# Patient Record
Sex: Female | Born: 1970
Health system: Southern US, Community
[De-identification: ages and names within clinical notes are randomized; demographics above are authoritative.]

## PROBLEM LIST (undated history)

## (undated) DIAGNOSIS — F32A Depression, unspecified: Secondary | ICD-10-CM

## (undated) DIAGNOSIS — Z923 Personal history of irradiation: Secondary | ICD-10-CM

## (undated) DIAGNOSIS — E119 Type 2 diabetes mellitus without complications: Secondary | ICD-10-CM

## (undated) DIAGNOSIS — F319 Bipolar disorder, unspecified: Secondary | ICD-10-CM

## (undated) DIAGNOSIS — M1990 Unspecified osteoarthritis, unspecified site: Secondary | ICD-10-CM

## (undated) DIAGNOSIS — F419 Anxiety disorder, unspecified: Secondary | ICD-10-CM

## (undated) DIAGNOSIS — Z9221 Personal history of antineoplastic chemotherapy: Secondary | ICD-10-CM

## (undated) DIAGNOSIS — D649 Anemia, unspecified: Secondary | ICD-10-CM

## (undated) DIAGNOSIS — J449 Chronic obstructive pulmonary disease, unspecified: Secondary | ICD-10-CM

## (undated) DIAGNOSIS — E039 Hypothyroidism, unspecified: Secondary | ICD-10-CM

## (undated) DIAGNOSIS — Z5189 Encounter for other specified aftercare: Secondary | ICD-10-CM

## (undated) DIAGNOSIS — R011 Cardiac murmur, unspecified: Secondary | ICD-10-CM

## (undated) DIAGNOSIS — C801 Malignant (primary) neoplasm, unspecified: Secondary | ICD-10-CM

## (undated) HISTORY — DX: Malignant (primary) neoplasm, unspecified: C80.1

## (undated) HISTORY — DX: Unspecified osteoarthritis, unspecified site: M19.90

## (undated) HISTORY — DX: Type 2 diabetes mellitus without complications: E11.9

## (undated) HISTORY — PX: TONSILLECTOMY AND ADENOIDECTOMY: SUR1326

## (undated) HISTORY — DX: Hypothyroidism, unspecified: E03.9

## (undated) HISTORY — DX: Anemia, unspecified: D64.9

## (undated) HISTORY — PX: MULTIPLE TOOTH EXTRACTIONS: SHX2053

## (undated) HISTORY — DX: Encounter for other specified aftercare: Z51.89

## (undated) HISTORY — DX: Personal history of irradiation: Z92.3

## (undated) HISTORY — PX: DILATION AND CURETTAGE OF UTERUS: SHX78

## (undated) HISTORY — DX: Cardiac murmur, unspecified: R01.1

---

## 2000-07-13 ENCOUNTER — Emergency Department (HOSPITAL_COMMUNITY): Admission: EM | Admit: 2000-07-13 | Discharge: 2000-07-13 | Payer: Self-pay | Admitting: Emergency Medicine

## 2001-02-16 ENCOUNTER — Emergency Department (HOSPITAL_COMMUNITY): Admission: EM | Admit: 2001-02-16 | Discharge: 2001-02-16 | Payer: Self-pay | Admitting: Emergency Medicine

## 2002-09-07 ENCOUNTER — Emergency Department (HOSPITAL_COMMUNITY): Admission: EM | Admit: 2002-09-07 | Discharge: 2002-09-07 | Payer: Self-pay | Admitting: Emergency Medicine

## 2003-03-18 ENCOUNTER — Encounter: Payer: Self-pay | Admitting: Family Medicine

## 2003-03-18 ENCOUNTER — Inpatient Hospital Stay (HOSPITAL_COMMUNITY): Admission: AD | Admit: 2003-03-18 | Discharge: 2003-03-18 | Payer: Self-pay | Admitting: Family Medicine

## 2003-04-06 ENCOUNTER — Encounter: Admission: RE | Admit: 2003-04-06 | Discharge: 2003-04-06 | Payer: Self-pay | Admitting: Family Medicine

## 2003-04-30 ENCOUNTER — Other Ambulatory Visit: Admission: RE | Admit: 2003-04-30 | Discharge: 2003-04-30 | Payer: Self-pay | Admitting: Family Medicine

## 2003-04-30 ENCOUNTER — Encounter: Admission: RE | Admit: 2003-04-30 | Discharge: 2003-04-30 | Payer: Self-pay | Admitting: Family Medicine

## 2003-05-07 ENCOUNTER — Ambulatory Visit (HOSPITAL_COMMUNITY): Admission: RE | Admit: 2003-05-07 | Discharge: 2003-05-07 | Payer: Self-pay | Admitting: Family Medicine

## 2003-05-27 ENCOUNTER — Encounter: Admission: RE | Admit: 2003-05-27 | Discharge: 2003-05-27 | Payer: Self-pay | Admitting: Family Medicine

## 2003-06-17 ENCOUNTER — Ambulatory Visit (HOSPITAL_COMMUNITY): Admission: RE | Admit: 2003-06-17 | Discharge: 2003-06-17 | Payer: Self-pay | Admitting: Obstetrics and Gynecology

## 2003-06-26 ENCOUNTER — Inpatient Hospital Stay (HOSPITAL_COMMUNITY): Admission: AD | Admit: 2003-06-26 | Discharge: 2003-06-26 | Payer: Self-pay | Admitting: *Deleted

## 2003-06-29 ENCOUNTER — Encounter: Admission: RE | Admit: 2003-06-29 | Discharge: 2003-06-29 | Payer: Self-pay | Admitting: Sports Medicine

## 2003-07-28 ENCOUNTER — Encounter: Admission: RE | Admit: 2003-07-28 | Discharge: 2003-07-28 | Payer: Self-pay | Admitting: Family Medicine

## 2003-08-05 ENCOUNTER — Encounter: Admission: RE | Admit: 2003-08-05 | Discharge: 2003-08-05 | Payer: Self-pay | Admitting: Sports Medicine

## 2003-08-11 ENCOUNTER — Encounter: Admission: RE | Admit: 2003-08-11 | Discharge: 2003-08-11 | Payer: Self-pay | Admitting: Family Medicine

## 2003-08-13 ENCOUNTER — Inpatient Hospital Stay (HOSPITAL_COMMUNITY): Admission: AD | Admit: 2003-08-13 | Discharge: 2003-08-14 | Payer: Self-pay | Admitting: Family Medicine

## 2003-08-23 ENCOUNTER — Encounter: Admission: RE | Admit: 2003-08-23 | Discharge: 2003-08-23 | Payer: Self-pay | Admitting: Family Medicine

## 2003-09-06 ENCOUNTER — Encounter: Admission: RE | Admit: 2003-09-06 | Discharge: 2003-09-06 | Payer: Self-pay | Admitting: Family Medicine

## 2003-09-08 ENCOUNTER — Inpatient Hospital Stay (HOSPITAL_COMMUNITY): Admission: AD | Admit: 2003-09-08 | Discharge: 2003-09-08 | Payer: Self-pay | Admitting: *Deleted

## 2003-09-27 ENCOUNTER — Encounter: Admission: RE | Admit: 2003-09-27 | Discharge: 2003-09-27 | Payer: Self-pay | Admitting: Family Medicine

## 2003-10-12 ENCOUNTER — Encounter: Admission: RE | Admit: 2003-10-12 | Discharge: 2003-10-12 | Payer: Self-pay | Admitting: Sports Medicine

## 2003-10-26 ENCOUNTER — Encounter: Admission: RE | Admit: 2003-10-26 | Discharge: 2003-10-26 | Payer: Self-pay | Admitting: Sports Medicine

## 2003-10-27 ENCOUNTER — Inpatient Hospital Stay (HOSPITAL_COMMUNITY): Admission: AD | Admit: 2003-10-27 | Discharge: 2003-10-27 | Payer: Self-pay | Admitting: Obstetrics and Gynecology

## 2003-10-28 ENCOUNTER — Inpatient Hospital Stay (HOSPITAL_COMMUNITY): Admission: AD | Admit: 2003-10-28 | Discharge: 2003-10-28 | Payer: Self-pay | Admitting: Family Medicine

## 2003-11-04 ENCOUNTER — Encounter: Admission: RE | Admit: 2003-11-04 | Discharge: 2003-11-04 | Payer: Self-pay | Admitting: Sports Medicine

## 2003-11-10 ENCOUNTER — Encounter: Admission: RE | Admit: 2003-11-10 | Discharge: 2003-11-10 | Payer: Self-pay | Admitting: Family Medicine

## 2003-11-16 ENCOUNTER — Encounter: Admission: RE | Admit: 2003-11-16 | Discharge: 2003-11-16 | Payer: Self-pay | Admitting: Family Medicine

## 2003-11-18 ENCOUNTER — Inpatient Hospital Stay (HOSPITAL_COMMUNITY): Admission: AD | Admit: 2003-11-18 | Discharge: 2003-11-21 | Payer: Self-pay | Admitting: Obstetrics & Gynecology

## 2004-04-10 ENCOUNTER — Encounter: Admission: RE | Admit: 2004-04-10 | Discharge: 2004-04-10 | Payer: Self-pay | Admitting: Family Medicine

## 2004-05-19 ENCOUNTER — Encounter: Admission: RE | Admit: 2004-05-19 | Discharge: 2004-05-19 | Payer: Self-pay | Admitting: Family Medicine

## 2004-06-15 ENCOUNTER — Inpatient Hospital Stay (HOSPITAL_COMMUNITY): Admission: AD | Admit: 2004-06-15 | Discharge: 2004-06-15 | Payer: Self-pay | Admitting: *Deleted

## 2004-06-19 ENCOUNTER — Encounter: Admission: RE | Admit: 2004-06-19 | Discharge: 2004-06-19 | Payer: Self-pay | Admitting: Family Medicine

## 2004-07-06 ENCOUNTER — Ambulatory Visit: Payer: Self-pay | Admitting: Family Medicine

## 2004-07-06 ENCOUNTER — Ambulatory Visit (HOSPITAL_COMMUNITY): Admission: RE | Admit: 2004-07-06 | Discharge: 2004-07-06 | Payer: Self-pay | Admitting: Family Medicine

## 2004-08-04 ENCOUNTER — Ambulatory Visit: Payer: Self-pay | Admitting: Family Medicine

## 2004-09-07 ENCOUNTER — Ambulatory Visit: Payer: Self-pay | Admitting: Sports Medicine

## 2004-09-28 ENCOUNTER — Ambulatory Visit (HOSPITAL_COMMUNITY): Admission: RE | Admit: 2004-09-28 | Discharge: 2004-09-28 | Payer: Self-pay | Admitting: Family Medicine

## 2004-10-02 ENCOUNTER — Ambulatory Visit: Payer: Self-pay | Admitting: Family Medicine

## 2004-10-09 ENCOUNTER — Ambulatory Visit: Payer: Self-pay | Admitting: Family Medicine

## 2004-11-16 ENCOUNTER — Ambulatory Visit: Payer: Self-pay | Admitting: Sports Medicine

## 2004-11-16 ENCOUNTER — Other Ambulatory Visit: Admission: RE | Admit: 2004-11-16 | Discharge: 2004-11-16 | Payer: Self-pay | Admitting: Family Medicine

## 2004-11-16 ENCOUNTER — Ambulatory Visit: Payer: Self-pay | Admitting: Family Medicine

## 2004-11-16 ENCOUNTER — Inpatient Hospital Stay (HOSPITAL_COMMUNITY): Admission: RE | Admit: 2004-11-16 | Discharge: 2004-11-21 | Payer: Self-pay | Admitting: Family Medicine

## 2004-11-23 ENCOUNTER — Ambulatory Visit: Payer: Self-pay | Admitting: Family Medicine

## 2004-11-29 ENCOUNTER — Ambulatory Visit: Payer: Self-pay | Admitting: *Deleted

## 2004-11-29 ENCOUNTER — Inpatient Hospital Stay (HOSPITAL_COMMUNITY): Admission: AD | Admit: 2004-11-29 | Discharge: 2004-11-29 | Payer: Self-pay | Admitting: Obstetrics and Gynecology

## 2004-12-13 ENCOUNTER — Ambulatory Visit: Payer: Self-pay | Admitting: *Deleted

## 2004-12-20 ENCOUNTER — Ambulatory Visit: Payer: Self-pay | Admitting: *Deleted

## 2004-12-27 ENCOUNTER — Ambulatory Visit: Payer: Self-pay | Admitting: *Deleted

## 2005-01-03 ENCOUNTER — Ambulatory Visit: Payer: Self-pay | Admitting: *Deleted

## 2005-01-03 ENCOUNTER — Ambulatory Visit (HOSPITAL_COMMUNITY): Admission: RE | Admit: 2005-01-03 | Discharge: 2005-01-03 | Payer: Self-pay | Admitting: *Deleted

## 2005-01-10 ENCOUNTER — Ambulatory Visit: Payer: Self-pay | Admitting: *Deleted

## 2005-01-13 ENCOUNTER — Observation Stay (HOSPITAL_COMMUNITY): Admission: AD | Admit: 2005-01-13 | Discharge: 2005-01-13 | Payer: Self-pay | Admitting: *Deleted

## 2005-01-13 ENCOUNTER — Ambulatory Visit: Payer: Self-pay | Admitting: Obstetrics and Gynecology

## 2005-01-17 ENCOUNTER — Ambulatory Visit: Payer: Self-pay | Admitting: *Deleted

## 2005-01-24 ENCOUNTER — Ambulatory Visit: Payer: Self-pay | Admitting: Obstetrics & Gynecology

## 2005-01-24 ENCOUNTER — Inpatient Hospital Stay (HOSPITAL_COMMUNITY): Admission: AD | Admit: 2005-01-24 | Discharge: 2005-01-26 | Payer: Self-pay | Admitting: Obstetrics & Gynecology

## 2005-01-24 ENCOUNTER — Encounter (INDEPENDENT_AMBULATORY_CARE_PROVIDER_SITE_OTHER): Payer: Self-pay | Admitting: *Deleted

## 2005-02-08 ENCOUNTER — Ambulatory Visit: Payer: Self-pay | Admitting: Sports Medicine

## 2005-03-02 ENCOUNTER — Ambulatory Visit: Payer: Self-pay | Admitting: Family Medicine

## 2005-08-22 ENCOUNTER — Ambulatory Visit: Payer: Self-pay | Admitting: Family Medicine

## 2005-09-03 ENCOUNTER — Other Ambulatory Visit: Admission: RE | Admit: 2005-09-03 | Discharge: 2005-09-03 | Payer: Self-pay | Admitting: Family Medicine

## 2005-09-03 ENCOUNTER — Ambulatory Visit: Payer: Self-pay | Admitting: Family Medicine

## 2005-10-02 ENCOUNTER — Ambulatory Visit: Payer: Self-pay | Admitting: Family Medicine

## 2005-10-03 ENCOUNTER — Ambulatory Visit: Payer: Self-pay | Admitting: Family Medicine

## 2005-10-22 DIAGNOSIS — Z5189 Encounter for other specified aftercare: Secondary | ICD-10-CM

## 2005-10-22 HISTORY — DX: Encounter for other specified aftercare: Z51.89

## 2005-10-31 ENCOUNTER — Ambulatory Visit: Payer: Self-pay | Admitting: Family Medicine

## 2005-11-13 ENCOUNTER — Ambulatory Visit (HOSPITAL_COMMUNITY): Admission: RE | Admit: 2005-11-13 | Discharge: 2005-11-13 | Payer: Self-pay | Admitting: *Deleted

## 2005-12-04 ENCOUNTER — Ambulatory Visit: Payer: Self-pay | Admitting: Sports Medicine

## 2005-12-06 ENCOUNTER — Ambulatory Visit (HOSPITAL_COMMUNITY): Admission: RE | Admit: 2005-12-06 | Discharge: 2005-12-06 | Payer: Self-pay | Admitting: Family Medicine

## 2006-01-02 ENCOUNTER — Ambulatory Visit: Payer: Self-pay | Admitting: Family Medicine

## 2006-01-07 ENCOUNTER — Ambulatory Visit: Payer: Self-pay | Admitting: Family Medicine

## 2006-01-10 ENCOUNTER — Ambulatory Visit: Payer: Self-pay | Admitting: Family Medicine

## 2006-01-28 ENCOUNTER — Ambulatory Visit: Payer: Self-pay | Admitting: Family Medicine

## 2006-03-04 ENCOUNTER — Ambulatory Visit: Payer: Self-pay | Admitting: Family Medicine

## 2006-03-19 ENCOUNTER — Ambulatory Visit: Payer: Self-pay | Admitting: Family Medicine

## 2006-03-25 ENCOUNTER — Inpatient Hospital Stay (HOSPITAL_COMMUNITY): Admission: AD | Admit: 2006-03-25 | Discharge: 2006-03-27 | Payer: Self-pay | Admitting: Family Medicine

## 2006-03-25 ENCOUNTER — Ambulatory Visit: Payer: Self-pay | Admitting: Gynecology

## 2006-03-29 ENCOUNTER — Ambulatory Visit: Payer: Self-pay | Admitting: Family Medicine

## 2006-04-09 ENCOUNTER — Ambulatory Visit: Payer: Self-pay | Admitting: Sports Medicine

## 2006-04-11 ENCOUNTER — Ambulatory Visit: Payer: Self-pay | Admitting: Obstetrics & Gynecology

## 2006-04-11 ENCOUNTER — Inpatient Hospital Stay (HOSPITAL_COMMUNITY): Admission: AD | Admit: 2006-04-11 | Discharge: 2006-04-12 | Payer: Self-pay | Admitting: Obstetrics and Gynecology

## 2006-04-11 ENCOUNTER — Encounter (INDEPENDENT_AMBULATORY_CARE_PROVIDER_SITE_OTHER): Payer: Self-pay | Admitting: Specialist

## 2006-04-16 ENCOUNTER — Ambulatory Visit (HOSPITAL_COMMUNITY): Admission: RE | Admit: 2006-04-16 | Discharge: 2006-04-16 | Payer: Self-pay | Admitting: Internal Medicine

## 2006-04-17 ENCOUNTER — Ambulatory Visit: Payer: Self-pay | Admitting: Family Medicine

## 2006-04-26 ENCOUNTER — Ambulatory Visit: Payer: Self-pay | Admitting: Gynecology

## 2006-05-09 ENCOUNTER — Ambulatory Visit: Payer: Self-pay | Admitting: Family Medicine

## 2006-07-11 ENCOUNTER — Ambulatory Visit: Payer: Self-pay | Admitting: Family Medicine

## 2006-07-29 ENCOUNTER — Ambulatory Visit: Payer: Self-pay | Admitting: Family Medicine

## 2006-08-09 ENCOUNTER — Encounter: Admission: RE | Admit: 2006-08-09 | Discharge: 2006-08-09 | Payer: Self-pay | Admitting: Sports Medicine

## 2006-08-16 ENCOUNTER — Ambulatory Visit: Payer: Self-pay | Admitting: Family Medicine

## 2006-08-22 ENCOUNTER — Encounter (INDEPENDENT_AMBULATORY_CARE_PROVIDER_SITE_OTHER): Payer: Self-pay | Admitting: *Deleted

## 2006-08-22 LAB — CONVERTED CEMR LAB: Pap Smear: NORMAL

## 2006-08-27 ENCOUNTER — Ambulatory Visit: Payer: Self-pay | Admitting: Family Medicine

## 2006-10-03 ENCOUNTER — Ambulatory Visit: Payer: Self-pay | Admitting: Sports Medicine

## 2006-10-28 ENCOUNTER — Ambulatory Visit: Payer: Self-pay | Admitting: Family Medicine

## 2006-11-19 ENCOUNTER — Encounter (INDEPENDENT_AMBULATORY_CARE_PROVIDER_SITE_OTHER): Payer: Self-pay | Admitting: Family Medicine

## 2006-11-19 ENCOUNTER — Ambulatory Visit: Payer: Self-pay | Admitting: Family Medicine

## 2006-11-19 LAB — CONVERTED CEMR LAB
ALT: <8 units/L (ref 0–35)
AST: 11 units/L (ref 0–37)
Albumin: 4.3 g/dL (ref 3.5–5.2)
Alkaline Phosphatase: 68 units/L (ref 39–117)
BUN: 8 mg/dL (ref 6–23)
CO2: 22 meq/L (ref 19–32)
Calcium: 8.8 mg/dL (ref 8.4–10.5)
Chloride: 104 meq/L (ref 96–112)
Cholesterol: 205 mg/dL — ABNORMAL HIGH (ref 0–200)
Creatinine, Ser: 0.93 mg/dL (ref 0.40–1.20)
Glucose, Bld: 65 mg/dL — ABNORMAL LOW (ref 70–99)
HDL: 38 mg/dL — ABNORMAL LOW (ref >39)
LDL Cholesterol: 143 mg/dL — ABNORMAL HIGH (ref 0–99)
Potassium: 4.1 meq/L (ref 3.5–5.3)
Sodium: 137 meq/L (ref 135–145)
Total Bilirubin: 0.3 mg/dL (ref 0.3–1.2)
Total CHOL/HDL Ratio: 5.4
Total Protein: 8 g/dL (ref 6.0–8.3)
Triglycerides: 118 mg/dL (ref <150)
VLDL: 24 mg/dL (ref 0–40)

## 2006-12-20 ENCOUNTER — Encounter (INDEPENDENT_AMBULATORY_CARE_PROVIDER_SITE_OTHER): Payer: Self-pay | Admitting: *Deleted

## 2007-01-15 ENCOUNTER — Ambulatory Visit: Payer: Self-pay | Admitting: Obstetrics and Gynecology

## 2007-01-20 ENCOUNTER — Telehealth: Payer: Self-pay | Admitting: *Deleted

## 2007-02-10 ENCOUNTER — Telehealth (INDEPENDENT_AMBULATORY_CARE_PROVIDER_SITE_OTHER): Payer: Self-pay | Admitting: *Deleted

## 2007-02-12 ENCOUNTER — Telehealth: Payer: Self-pay | Admitting: *Deleted

## 2007-02-20 ENCOUNTER — Ambulatory Visit: Payer: Self-pay | Admitting: Sports Medicine

## 2007-02-20 DIAGNOSIS — F17210 Nicotine dependence, cigarettes, uncomplicated: Secondary | ICD-10-CM

## 2007-02-20 DIAGNOSIS — F17200 Nicotine dependence, unspecified, uncomplicated: Secondary | ICD-10-CM | POA: Insufficient documentation

## 2007-02-20 DIAGNOSIS — Z720 Tobacco use: Secondary | ICD-10-CM | POA: Insufficient documentation

## 2007-02-20 DIAGNOSIS — F3160 Bipolar disorder, current episode mixed, unspecified: Secondary | ICD-10-CM | POA: Insufficient documentation

## 2007-02-20 LAB — CONVERTED CEMR LAB: Beta hcg, urine, semiquantitative: NEGATIVE

## 2007-02-25 ENCOUNTER — Ambulatory Visit: Payer: Self-pay | Admitting: Family Medicine

## 2007-02-25 ENCOUNTER — Encounter (INDEPENDENT_AMBULATORY_CARE_PROVIDER_SITE_OTHER): Payer: Self-pay | Admitting: Family Medicine

## 2007-02-25 LAB — CONVERTED CEMR LAB
ALT: 9 units/L (ref 0–35)
AST: 15 units/L (ref 0–37)
Albumin: 4.3 g/dL (ref 3.5–5.2)
Alkaline Phosphatase: 65 units/L (ref 39–117)
BUN: 9 mg/dL (ref 6–23)
CO2: 22 meq/L (ref 19–32)
Calcium: 8.8 mg/dL (ref 8.4–10.5)
Chloride: 104 meq/L (ref 96–112)
Cholesterol: 193 mg/dL (ref 0–200)
Creatinine, Ser: 0.9 mg/dL (ref 0.40–1.20)
Glucose, Bld: 69 mg/dL — ABNORMAL LOW (ref 70–99)
HCT: 29.8 %
HDL: 43 mg/dL (ref >39)
Hemoglobin: 9.2 g/dL
LDL Cholesterol: 126 mg/dL — ABNORMAL HIGH (ref 0–99)
MCV: 60.6 fL
Platelets: 349 10*3/uL
Potassium: 4.2 meq/L (ref 3.5–5.3)
RBC: 4.92 M/uL
Sodium: 139 meq/L (ref 135–145)
TSH: 2.069 microintl units/mL (ref 0.350–5.50)
Total Bilirubin: 0.2 mg/dL — ABNORMAL LOW (ref 0.3–1.2)
Total CHOL/HDL Ratio: 4.5
Total Protein: 7.9 g/dL (ref 6.0–8.3)
Triglycerides: 120 mg/dL (ref <150)
VLDL: 24 mg/dL (ref 0–40)
WBC: 7.4 10*3/uL

## 2007-04-02 ENCOUNTER — Ambulatory Visit (HOSPITAL_BASED_OUTPATIENT_CLINIC_OR_DEPARTMENT_OTHER): Admission: RE | Admit: 2007-04-02 | Discharge: 2007-04-02 | Payer: Self-pay | Admitting: Family Medicine

## 2007-04-05 ENCOUNTER — Ambulatory Visit: Payer: Self-pay | Admitting: Internal Medicine

## 2007-04-23 ENCOUNTER — Ambulatory Visit: Payer: Self-pay | Admitting: Family Medicine

## 2007-05-27 ENCOUNTER — Ambulatory Visit: Payer: Self-pay | Admitting: Family Medicine

## 2007-05-27 LAB — CONVERTED CEMR LAB: Beta hcg, urine, semiquantitative: NEGATIVE

## 2007-06-04 ENCOUNTER — Telehealth (INDEPENDENT_AMBULATORY_CARE_PROVIDER_SITE_OTHER): Payer: Self-pay | Admitting: Family Medicine

## 2007-06-04 ENCOUNTER — Ambulatory Visit: Payer: Self-pay | Admitting: Family Medicine

## 2007-06-18 ENCOUNTER — Encounter (INDEPENDENT_AMBULATORY_CARE_PROVIDER_SITE_OTHER): Payer: Self-pay | Admitting: Family Medicine

## 2007-06-18 ENCOUNTER — Ambulatory Visit: Payer: Self-pay | Admitting: Family Medicine

## 2007-06-27 ENCOUNTER — Ambulatory Visit: Payer: Self-pay | Admitting: Family Medicine

## 2007-06-30 ENCOUNTER — Telehealth (INDEPENDENT_AMBULATORY_CARE_PROVIDER_SITE_OTHER): Payer: Self-pay | Admitting: Family Medicine

## 2007-07-04 ENCOUNTER — Ambulatory Visit: Payer: Self-pay | Admitting: Family Medicine

## 2007-07-04 ENCOUNTER — Telehealth (INDEPENDENT_AMBULATORY_CARE_PROVIDER_SITE_OTHER): Payer: Self-pay | Admitting: Family Medicine

## 2007-07-04 ENCOUNTER — Encounter (INDEPENDENT_AMBULATORY_CARE_PROVIDER_SITE_OTHER): Payer: Self-pay | Admitting: Family Medicine

## 2007-07-05 ENCOUNTER — Telehealth (INDEPENDENT_AMBULATORY_CARE_PROVIDER_SITE_OTHER): Payer: Self-pay | Admitting: Family Medicine

## 2007-07-07 ENCOUNTER — Telehealth (INDEPENDENT_AMBULATORY_CARE_PROVIDER_SITE_OTHER): Payer: Self-pay | Admitting: Family Medicine

## 2007-07-14 ENCOUNTER — Encounter (INDEPENDENT_AMBULATORY_CARE_PROVIDER_SITE_OTHER): Payer: Self-pay | Admitting: Family Medicine

## 2007-07-18 ENCOUNTER — Ambulatory Visit: Payer: Self-pay | Admitting: Family Medicine

## 2007-07-18 ENCOUNTER — Encounter (INDEPENDENT_AMBULATORY_CARE_PROVIDER_SITE_OTHER): Payer: Self-pay | Admitting: Family Medicine

## 2007-08-01 ENCOUNTER — Encounter (INDEPENDENT_AMBULATORY_CARE_PROVIDER_SITE_OTHER): Payer: Self-pay | Admitting: Family Medicine

## 2007-08-01 ENCOUNTER — Encounter: Payer: Self-pay | Admitting: *Deleted

## 2007-08-01 ENCOUNTER — Ambulatory Visit: Payer: Self-pay | Admitting: Family Medicine

## 2007-08-01 LAB — CONVERTED CEMR LAB
Antibody Screen: NEGATIVE
Basophils Absolute: 0.1 10*3/uL (ref 0.0–0.1)
Basophils Relative: 1 % (ref 0–1)
Beta hcg, urine, semiquantitative: POSITIVE
Eosinophils Absolute: 0.1 10*3/uL (ref 0.0–0.7)
Eosinophils Relative: 1 % (ref 0–5)
HCT: 32 % — ABNORMAL LOW (ref 36.0–46.0)
Hemoglobin: 8.4 g/dL — ABNORMAL LOW (ref 12.0–15.0)
Hepatitis B Surface Ag: NEGATIVE
Lymphocytes Relative: 40 % (ref 12–46)
Lymphs Abs: 3.3 10*3/uL (ref 0.7–3.3)
MCHC: 26.3 g/dL — ABNORMAL LOW (ref 30.0–36.0)
MCV: 59.7 fL — ABNORMAL LOW (ref 78.0–100.0)
Monocytes Absolute: 1 10*3/uL — ABNORMAL HIGH (ref 0.2–0.7)
Monocytes Relative: 12 % — ABNORMAL HIGH (ref 3–11)
Neutro Abs: 3.7 10*3/uL (ref 1.7–7.7)
Neutrophils Relative %: 45 % (ref 43–77)
Platelets: 391 10*3/uL (ref 150–400)
RBC: 5.36 M/uL — ABNORMAL HIGH (ref 3.87–5.11)
RDW: 20.5 % — ABNORMAL HIGH (ref 11.5–14.0)
Rh Type: POSITIVE
Rubella: 30.5 intl units/mL — ABNORMAL HIGH
WBC: 8.2 10*3/uL (ref 4.0–10.5)

## 2007-08-02 ENCOUNTER — Encounter (INDEPENDENT_AMBULATORY_CARE_PROVIDER_SITE_OTHER): Payer: Self-pay | Admitting: Family Medicine

## 2007-08-06 ENCOUNTER — Encounter (INDEPENDENT_AMBULATORY_CARE_PROVIDER_SITE_OTHER): Payer: Self-pay | Admitting: Family Medicine

## 2007-08-06 ENCOUNTER — Ambulatory Visit (HOSPITAL_COMMUNITY): Admission: RE | Admit: 2007-08-06 | Discharge: 2007-08-06 | Payer: Self-pay | Admitting: Family Medicine

## 2007-08-14 ENCOUNTER — Encounter: Payer: Self-pay | Admitting: *Deleted

## 2007-08-26 ENCOUNTER — Encounter (INDEPENDENT_AMBULATORY_CARE_PROVIDER_SITE_OTHER): Payer: Self-pay | Admitting: Family Medicine

## 2007-08-26 ENCOUNTER — Telehealth (INDEPENDENT_AMBULATORY_CARE_PROVIDER_SITE_OTHER): Payer: Self-pay | Admitting: Family Medicine

## 2007-08-27 ENCOUNTER — Telehealth (INDEPENDENT_AMBULATORY_CARE_PROVIDER_SITE_OTHER): Payer: Self-pay | Admitting: Family Medicine

## 2007-08-27 ENCOUNTER — Inpatient Hospital Stay (HOSPITAL_COMMUNITY): Admission: RE | Admit: 2007-08-27 | Discharge: 2007-08-27 | Payer: Self-pay | Admitting: Family Medicine

## 2007-08-29 ENCOUNTER — Inpatient Hospital Stay (HOSPITAL_COMMUNITY): Admission: AD | Admit: 2007-08-29 | Discharge: 2007-08-29 | Payer: Self-pay | Admitting: Obstetrics and Gynecology

## 2007-09-03 ENCOUNTER — Telehealth (INDEPENDENT_AMBULATORY_CARE_PROVIDER_SITE_OTHER): Payer: Self-pay | Admitting: Family Medicine

## 2007-09-04 ENCOUNTER — Encounter (INDEPENDENT_AMBULATORY_CARE_PROVIDER_SITE_OTHER): Payer: Self-pay | Admitting: Family Medicine

## 2007-09-04 LAB — CONVERTED CEMR LAB
Cholesterol, target level: 200 mg/dL
HDL goal, serum: 40 mg/dL
LDL Goal: 160 mg/dL

## 2007-09-05 ENCOUNTER — Inpatient Hospital Stay (HOSPITAL_COMMUNITY): Admission: AD | Admit: 2007-09-05 | Discharge: 2007-09-05 | Payer: Self-pay | Admitting: Obstetrics & Gynecology

## 2007-09-12 ENCOUNTER — Inpatient Hospital Stay (HOSPITAL_COMMUNITY): Admission: AD | Admit: 2007-09-12 | Discharge: 2007-09-12 | Payer: Self-pay | Admitting: Obstetrics & Gynecology

## 2007-09-26 ENCOUNTER — Inpatient Hospital Stay (HOSPITAL_COMMUNITY): Admission: AD | Admit: 2007-09-26 | Discharge: 2007-09-26 | Payer: Self-pay | Admitting: Obstetrics & Gynecology

## 2007-10-30 ENCOUNTER — Telehealth: Payer: Self-pay | Admitting: *Deleted

## 2007-11-07 ENCOUNTER — Ambulatory Visit: Payer: Self-pay

## 2007-11-13 ENCOUNTER — Encounter (INDEPENDENT_AMBULATORY_CARE_PROVIDER_SITE_OTHER): Payer: Self-pay | Admitting: Family Medicine

## 2007-11-13 ENCOUNTER — Ambulatory Visit: Payer: Self-pay | Admitting: Family Medicine

## 2007-11-14 DIAGNOSIS — D509 Iron deficiency anemia, unspecified: Secondary | ICD-10-CM | POA: Insufficient documentation

## 2007-11-14 LAB — CONVERTED CEMR LAB
ALT: 8 units/L (ref 0–35)
AST: 13 units/L (ref 0–37)
Albumin: 4.4 g/dL (ref 3.5–5.2)
Alkaline Phosphatase: 62 units/L (ref 39–117)
BUN: 9 mg/dL (ref 6–23)
Basophils Absolute: 0.1 10*3/uL (ref 0.0–0.1)
Basophils Relative: 2 % — ABNORMAL HIGH (ref 0–1)
CO2: 22 meq/L (ref 19–32)
Calcium: 8.7 mg/dL (ref 8.4–10.5)
Chloride: 103 meq/L (ref 96–112)
Creatinine, Ser: 0.85 mg/dL (ref 0.40–1.20)
Direct LDL: 140 mg/dL — ABNORMAL HIGH
Eosinophils Absolute: 0.2 10*3/uL (ref 0.0–0.7)
Eosinophils Relative: 2 % (ref 0–5)
Glucose, Bld: 86 mg/dL (ref 70–99)
HCT: 31.3 % — ABNORMAL LOW (ref 36.0–46.0)
Hemoglobin: 8.6 g/dL — ABNORMAL LOW (ref 12.0–15.0)
Lymphocytes Relative: 39 % (ref 12–46)
Lymphs Abs: 3.1 10*3/uL (ref 0.7–4.0)
MCHC: 27.5 g/dL — ABNORMAL LOW (ref 30.0–36.0)
MCV: 59.8 fL — ABNORMAL LOW (ref 78.0–100.0)
Monocytes Absolute: 0.9 10*3/uL (ref 0.1–1.0)
Monocytes Relative: 12 % (ref 3–12)
Neutro Abs: 3.7 10*3/uL (ref 1.7–7.7)
Neutrophils Relative %: 46 % (ref 43–77)
Platelets: 313 10*3/uL (ref 150–400)
Potassium: 4.1 meq/L (ref 3.5–5.3)
RBC: 5.23 M/uL — ABNORMAL HIGH (ref 3.87–5.11)
RDW: 19.9 % — ABNORMAL HIGH (ref 11.5–15.5)
Sodium: 135 meq/L (ref 135–145)
Total Bilirubin: 0.3 mg/dL (ref 0.3–1.2)
Total Protein: 8.2 g/dL (ref 6.0–8.3)
WBC: 8 10*3/uL (ref 4.0–10.5)

## 2007-11-19 ENCOUNTER — Ambulatory Visit: Payer: Self-pay | Admitting: Family Medicine

## 2007-12-08 ENCOUNTER — Ambulatory Visit: Payer: Self-pay | Admitting: Family Medicine

## 2007-12-29 ENCOUNTER — Telehealth: Payer: Self-pay | Admitting: *Deleted

## 2008-01-05 ENCOUNTER — Ambulatory Visit: Payer: Self-pay | Admitting: Sports Medicine

## 2008-01-14 ENCOUNTER — Telehealth (INDEPENDENT_AMBULATORY_CARE_PROVIDER_SITE_OTHER): Payer: Self-pay | Admitting: Family Medicine

## 2008-03-02 ENCOUNTER — Ambulatory Visit: Payer: Self-pay | Admitting: Family Medicine

## 2008-03-11 ENCOUNTER — Encounter (INDEPENDENT_AMBULATORY_CARE_PROVIDER_SITE_OTHER): Payer: Self-pay | Admitting: *Deleted

## 2008-03-31 ENCOUNTER — Encounter (INDEPENDENT_AMBULATORY_CARE_PROVIDER_SITE_OTHER): Payer: Self-pay | Admitting: Family Medicine

## 2008-04-02 ENCOUNTER — Ambulatory Visit: Payer: Self-pay | Admitting: Family Medicine

## 2008-05-04 ENCOUNTER — Encounter: Payer: Self-pay | Admitting: Family Medicine

## 2008-05-27 ENCOUNTER — Encounter: Payer: Self-pay | Admitting: *Deleted

## 2008-05-27 ENCOUNTER — Telehealth (INDEPENDENT_AMBULATORY_CARE_PROVIDER_SITE_OTHER): Payer: Self-pay | Admitting: Family Medicine

## 2008-06-07 ENCOUNTER — Ambulatory Visit: Payer: Self-pay | Admitting: Family Medicine

## 2008-06-21 ENCOUNTER — Encounter: Payer: Self-pay | Admitting: Family Medicine

## 2008-06-21 ENCOUNTER — Encounter: Payer: Self-pay | Admitting: *Deleted

## 2008-06-29 ENCOUNTER — Telehealth (INDEPENDENT_AMBULATORY_CARE_PROVIDER_SITE_OTHER): Payer: Self-pay | Admitting: *Deleted

## 2008-06-30 ENCOUNTER — Ambulatory Visit: Payer: Self-pay | Admitting: Family Medicine

## 2008-07-08 ENCOUNTER — Encounter: Payer: Self-pay | Admitting: Family Medicine

## 2008-07-16 ENCOUNTER — Ambulatory Visit: Payer: Self-pay | Admitting: Family Medicine

## 2008-07-16 LAB — CONVERTED CEMR LAB: Beta hcg, urine, semiquantitative: NEGATIVE

## 2008-07-28 ENCOUNTER — Telehealth: Payer: Self-pay | Admitting: Psychology

## 2008-07-30 ENCOUNTER — Encounter: Payer: Self-pay | Admitting: Family Medicine

## 2008-08-06 ENCOUNTER — Ambulatory Visit: Payer: Self-pay | Admitting: Family Medicine

## 2008-08-07 ENCOUNTER — Telehealth: Payer: Self-pay | Admitting: Family Medicine

## 2008-08-24 ENCOUNTER — Encounter: Payer: Self-pay | Admitting: Family Medicine

## 2008-08-27 ENCOUNTER — Encounter: Payer: Self-pay | Admitting: *Deleted

## 2008-09-01 ENCOUNTER — Encounter (INDEPENDENT_AMBULATORY_CARE_PROVIDER_SITE_OTHER): Payer: Self-pay | Admitting: *Deleted

## 2008-09-06 ENCOUNTER — Encounter: Payer: Self-pay | Admitting: *Deleted

## 2008-09-29 ENCOUNTER — Other Ambulatory Visit: Admission: RE | Admit: 2008-09-29 | Discharge: 2008-09-29 | Payer: Self-pay | Admitting: Family Medicine

## 2008-09-29 ENCOUNTER — Encounter: Payer: Self-pay | Admitting: Family Medicine

## 2008-09-29 ENCOUNTER — Ambulatory Visit: Payer: Self-pay | Admitting: Family Medicine

## 2008-09-29 DIAGNOSIS — M545 Low back pain, unspecified: Secondary | ICD-10-CM | POA: Insufficient documentation

## 2008-09-29 DIAGNOSIS — N329 Bladder disorder, unspecified: Secondary | ICD-10-CM | POA: Insufficient documentation

## 2008-09-29 LAB — CONVERTED CEMR LAB
Chlamydia, DNA Probe: NEGATIVE
GC Probe Amp, Genital: NEGATIVE

## 2008-09-30 ENCOUNTER — Telehealth: Payer: Self-pay | Admitting: *Deleted

## 2008-10-01 ENCOUNTER — Encounter: Payer: Self-pay | Admitting: Family Medicine

## 2008-11-24 ENCOUNTER — Ambulatory Visit: Payer: Self-pay | Admitting: Obstetrics and Gynecology

## 2009-01-20 ENCOUNTER — Telehealth: Payer: Self-pay | Admitting: Family Medicine

## 2009-03-15 ENCOUNTER — Ambulatory Visit: Payer: Self-pay | Admitting: Family Medicine

## 2009-05-16 ENCOUNTER — Ambulatory Visit: Payer: Self-pay | Admitting: Family Medicine

## 2009-06-06 ENCOUNTER — Encounter: Payer: Self-pay | Admitting: Family Medicine

## 2009-07-08 ENCOUNTER — Telehealth: Payer: Self-pay | Admitting: Family Medicine

## 2009-07-12 ENCOUNTER — Telehealth: Payer: Self-pay | Admitting: Family Medicine

## 2009-07-12 ENCOUNTER — Ambulatory Visit: Payer: Self-pay | Admitting: Family Medicine

## 2009-09-29 ENCOUNTER — Telehealth: Payer: Self-pay | Admitting: Family Medicine

## 2009-09-30 ENCOUNTER — Telehealth (INDEPENDENT_AMBULATORY_CARE_PROVIDER_SITE_OTHER): Payer: Self-pay | Admitting: *Deleted

## 2009-10-04 ENCOUNTER — Ambulatory Visit: Payer: Self-pay | Admitting: Family Medicine

## 2009-10-04 ENCOUNTER — Encounter: Payer: Self-pay | Admitting: Family Medicine

## 2009-10-04 DIAGNOSIS — G2581 Restless legs syndrome: Secondary | ICD-10-CM | POA: Insufficient documentation

## 2009-10-04 LAB — CONVERTED CEMR LAB
ALT: 11 units/L (ref 0–35)
AST: 13 units/L (ref 0–37)
Albumin: 4.3 g/dL (ref 3.5–5.2)
Alkaline Phosphatase: 68 units/L (ref 39–117)
BUN: 11 mg/dL (ref 6–23)
CO2: 17 meq/L — ABNORMAL LOW (ref 19–32)
Calcium: 9.3 mg/dL (ref 8.4–10.5)
Chloride: 103 meq/L (ref 96–112)
Creatinine, Ser: 0.97 mg/dL (ref 0.40–1.20)
Glucose, Bld: 101 mg/dL — ABNORMAL HIGH (ref 70–99)
HCT: 44.1 % (ref 36.0–46.0)
Hemoglobin: 14.7 g/dL (ref 12.0–15.0)
MCHC: 33.3 g/dL (ref 30.0–36.0)
MCV: 78.1 fL (ref 78.0–100.0)
Platelets: 285 10*3/uL (ref 150–400)
Potassium: 4.2 meq/L (ref 3.5–5.3)
RBC: 5.65 M/uL — ABNORMAL HIGH (ref 3.87–5.11)
RDW: 14.8 % (ref 11.5–15.5)
Sodium: 137 meq/L (ref 135–145)
Total Bilirubin: 0.3 mg/dL (ref 0.3–1.2)
Total Protein: 7.4 g/dL (ref 6.0–8.3)
WBC: 6.8 10*3/uL (ref 4.0–10.5)

## 2009-10-12 ENCOUNTER — Ambulatory Visit: Payer: Self-pay | Admitting: Family Medicine

## 2009-12-02 ENCOUNTER — Telehealth (INDEPENDENT_AMBULATORY_CARE_PROVIDER_SITE_OTHER): Payer: Self-pay | Admitting: *Deleted

## 2009-12-02 ENCOUNTER — Encounter: Payer: Self-pay | Admitting: *Deleted

## 2009-12-02 ENCOUNTER — Ambulatory Visit: Payer: Self-pay | Admitting: Family Medicine

## 2009-12-02 ENCOUNTER — Encounter: Admission: RE | Admit: 2009-12-02 | Discharge: 2009-12-02 | Payer: Self-pay | Admitting: Family Medicine

## 2009-12-09 ENCOUNTER — Encounter: Payer: Self-pay | Admitting: Family Medicine

## 2009-12-15 ENCOUNTER — Telehealth: Payer: Self-pay | Admitting: Family Medicine

## 2009-12-16 ENCOUNTER — Telehealth: Payer: Self-pay | Admitting: Family Medicine

## 2009-12-20 ENCOUNTER — Telehealth: Payer: Self-pay | Admitting: Family Medicine

## 2010-02-23 ENCOUNTER — Telehealth: Payer: Self-pay | Admitting: Family Medicine

## 2010-03-10 ENCOUNTER — Telehealth: Payer: Self-pay | Admitting: Family Medicine

## 2010-03-27 ENCOUNTER — Encounter: Payer: Self-pay | Admitting: Family Medicine

## 2010-05-12 ENCOUNTER — Ambulatory Visit: Payer: Self-pay | Admitting: Family Medicine

## 2010-08-11 ENCOUNTER — Encounter: Payer: Self-pay | Admitting: *Deleted

## 2010-08-25 ENCOUNTER — Encounter: Payer: Self-pay | Admitting: Family Medicine

## 2010-09-04 ENCOUNTER — Ambulatory Visit: Payer: Self-pay | Admitting: Family Medicine

## 2010-11-17 ENCOUNTER — Telehealth: Payer: Self-pay | Admitting: *Deleted

## 2010-11-20 ENCOUNTER — Encounter: Payer: Self-pay | Admitting: Family Medicine

## 2010-11-20 ENCOUNTER — Ambulatory Visit: Admission: RE | Admit: 2010-11-20 | Discharge: 2010-11-20 | Payer: Self-pay | Source: Home / Self Care

## 2010-11-21 NOTE — Consult Note (Signed)
Summary: [facility]   Imported By: Clydell Hakim 06/10/2009 15:37:26  _____________________________________________________________________  External Attachment:    Type:   Image     Comment:   External Document

## 2010-11-21 NOTE — Miscellaneous (Signed)
Summary: Sleep Study  Clinical Lists Changes  Observations: Added new observation of RESULTS MISC: Type of Report: Sleep Study Result: unremarkable sleep architecture.  Normal disordered breathing events.  Periodic limb movement.  Equivocal test given the amount of sedating medication patient ingested prior to study.  (04/02/2007 15:34)       MISC. Report  Procedure date:  04/02/2007  Findings:      Type of Report: Sleep Study Result: unremarkable sleep architecture.  Normal disordered breathing events.  Periodic limb movement.  Equivocal test given the amount of sedating medication patient ingested prior to study.    MISC. Report  Procedure date:  04/02/2007  Findings:      Type of Report: Sleep Study Result: unremarkable sleep architecture.  Normal disordered breathing events.  Periodic limb movement.  Equivocal test given the amount of sedating medication patient ingested prior to study.

## 2010-11-21 NOTE — Assessment & Plan Note (Signed)
Summary: FU PER Jadriel Saxer/KH   Vital Signs:  Patient Profile:   40 Years Old Female Height:     68.5 inches Weight:      236.8 pounds Temp:     98.6 degrees F Pulse rate:   91 / minute BP sitting:   108 / 74  (left arm)  Pt. in pain?   no  Vitals Entered By: Theresia Lo RN (August 01, 2007 1:46 PM)              Is Patient Diabetic? No     Chief Complaint:  f/u bipolar disorder and home pregnancy test 2 days ago positive.  History of Present Illness: Postive pregnancy test.  Patient was on a heft regimen for bipolar depression and insomnia.  She was also smoking.  She has stopped all medications entirely.  She is smoking two cigarettes a day and will stop entirely shortly.  She understands that given her medical history that this pregnancy is going to be high risk and that she'll be going to see [facility] for management of this pregnancy.  Mood-wise, she says she's been tired.  She has not had acute depression or mania since coming off of her medications.  She is sleeping ok, but "all the time".  She denies other psychiatric, neurologic or constitutional complaints.    Current Allergies: No known allergies   Past Medical History:    Reviewed history from 12/19/2006 and no changes required:       648.44 Postpartum Depression, Chronic sinusitis -2005, G5P5005, High frequency bil sensorineural hearing loss -, History of Gestational DM - G4 (diet controlled), History of polyhydramnios - G4, History of preterm labor/shortened cervix -G4    Family History:    Reviewed history from 12/19/2006 and no changes required:       Asthma, Diabetes 1st degree, HTN  Social History:    Reviewed history from 12/19/2006 and no changes required:       Lives with husband who is a Education officer, environmental at [employer], has 4  children, housewife, former smoker (1 ppd x 20 yrs), quit smoking recently   Risk Factors: Tobacco use:  current    Cigarettes:  Yes -- 1 pack(s) per day Passive smoke  exposure:  no Drug use:  no HIV high-risk behavior:  no Alcohol use:  no Exercise:  no  Family History Risk Factors:    Family History of MI in females < 75 years old:  no    Family History of MI in males < 71 years old:  no  PAP Smear History:    Date of Last PAP Smear:  08/22/2006   Review of Systems      See HPI   Physical Exam  General:     Well-developed,well-nourished,in no acute distress; alert,appropriate and cooperative throughout examination Lungs:     Normal respiratory effort, chest expands symmetrically. Lungs are clear to auscultation, no crackles or wheezes. Heart:     Normal rate and regular rhythm. S1 and S2 normal without gallop, murmur, click, rub or other extra sounds. Abdomen:     Bowel sounds positive,abdomen soft and non-tender without masses, organomegaly or hernias noted. Extremities:     No clubbing, cyanosis, edema, or deformity noted with normal full range of motion of all joints.   Neurologic:     No cranial nerve deficits noted. Station and gait are normal. Plantar reflexes are down-going bilaterally. DTRs are symmetrical throughout. Sensory, motor and coordinative functions appear intact. Psych:  Cognition and judgment appear intact. Alert and cooperative with normal attention span and concentration. 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 She has  stopped her medications entirely. She is at risk for decompensation of her bipolar and we've discussed this for more than 65m today. Prenatal vitamins ordered. Dating OB U/S. She will need to be referred to high risk OB for this pregnancy.  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  I am concerned that this may not stay the case, and we'll need close follow-up during this initial period off of medications to be sure that the bipolar depression is stable.  Orders: [facility]- Est  Level 4 (38756)   Problem # 3:  TOBACCO ABUSE (ICD-305.1) I advised patient to stop smoking. She has agreed to stop entirely.  The following medications were removed from the medication list:    Chantix Starting Month Pak 0.5 Mg X 11 & 1 Mg X 42 Misc (Varenicline tartrate) .Marland Kitchen... Take as directed in package insert.  Orders: [facility]- Est  Level 4 (43329)   Problem # 4:  INSOMNIA, PERSISTENT (ICD-307.42) Assessment: Unchanged Patient is sleeping well since stopping the trazodone.  Orders: [facility]- Est  Level 4 (51884)   Complete Medication List: 1)  Prenatal Vitamins 60-0.8 Mg Tabs (Prenatal multivit-min-fe-fa) .Marland Kitchen.. 1 tablet daily  Other Orders: U Preg-[facility] ([address])   Patient Instructions: 1)  Please make an appointment to see me for your OB physical, after you've had your ultrasound. 2)  Once you've been back to see me, we'll have you see high risk clinic for your pregnancy. 3)  Please let me know immediately if you have any problems mentally without your medications. 4)  Please be sure to call Dr. Pascal Lux to get set up with counseling while you're off your medications.    Prescriptions: PRENATAL VITAMINS 60-0.8 MG  TABS (PRENATAL MULTIVIT-MIN-FE-FA) 1 tablet daily  #90 x 6   Entered and Authorized by:   Towana Badger MD   Signed by:   Towana Badger MD on 08/01/2007   Method  used:   Electronically sent to ...       [facility] [address]       [address]       [place], [place]  [address]       Ph: [phone]       Fax: [phone]   RxID:   [id number]  ] Laboratory Results   Urine Tests  Date/Time Received: August 01, 2007 1.56  PM  Date/Time Reported: August 01, 2007 2:03 PM     Urine HCG: positive Comments: ...............test performed by......Marland KitchenBonnie A. [place], [place] (ASCP)

## 2010-11-21 NOTE — Progress Notes (Signed)
Summary: cxl appt  Phone Note Call from Patient Call back at Home Phone [phone]   Caller: Patient Summary of Call: pt called to say she has started her period yesterday and doesn't need to come today Initial call taken by: De Nurse,  Mar 10, 2010 8:34 AM

## 2010-11-21 NOTE — Assessment & Plan Note (Signed)
Summary: depression meds. & dry skin on knee   Vital Signs:  Patient Profile:   40 Years Old Female Height:     68.5 inches Weight:      237.2 pounds BMI:     35.67 Pulse rate:   89 / minute BP sitting:   135 / 84  (right arm)  Pt. in pain?   yes    Location:   back    Intensity:   8  Vitals Entered By: Arlyss Repress CMA, (June 07, 2008 8:38 AM)                  Chief Complaint:  f/up depression.  History of Present Illness: 40 yo female with longstanding Bipolar Disorder.  Presenting today with predominantly depressive symptoms since at least March.  She had a miscarriage in January and her mother committed suicide in March.  She states she cannot talk without crying, is unable to carry out daily tasks (relies heavily on her husband, Tylie Golonka, to take care of her and their 5 children), has sleep disturbances.  She says that she wishes she could just end her life, but has no active plan to do so.  Did try to commit suicide with pill overdose >1 year ago prior to being on medications.  Has been on current meds since March, without relief.  I told [name] that I was concerned about her mood to the point that I wanted to admit her to behavioral health.  She is quite resistant to this, as she would miss her 5 children and is looking forward to a birthday party for the youngest tomorrow.  I urged her numerous times to let me admit her to behavioral health, and she asked me to call her husband.  I spoke with him, and he states he is a disabled vet and is able to watch her 24 hours / day and is not concerned about her safety as he is able to monitor her and keep all dangerous things away from her.  Ms. Merlene Pulling states that he is, in fact, a very dedicated caretaker for her and keeps her medicines out of the way so she won't overdose.  However, Mr. Pieczynski was quite concerned about [name]' ringworm, so although he seems quite capable of caring for her day-to-day, I am doubtful  that he has good insight into the severity of her condition at this time.  She is seen by Alona Bene, [facility], at the [facility] [phone]).  She is supposed to have weekly appointments but has been missing them lately.  I called Ms. Isabell Jarvis, who says she believes [name] is getting worse lately and agrees with an admission for stabilization.    Updated Prior Medication List: PRENATAL VITAMINS 60-0.8 MG  TABS (PRENATAL MULTIVIT-MIN-FE-FA) 1 tablet daily LAMOTRIGINE 100 MG  TABS (LAMOTRIGINE) 2 tabs by mouth daily FERROUS SULFATE 324 MG  TBEC (FERROUS SULFATE) One tablet daily for one week, then twice a day for anemia due to menstrual blood loss CITALOPRAM HYDROBROMIDE 40 MG  TABS (CITALOPRAM HYDROBROMIDE) 1-1/2 tabs by mouth daily  Current Allergies (reviewed today): No known allergies     Risk Factors:     Counseled to quit/cut down tobacco use:  yes    Physical Exam  General:     Alert and oriented x3, obese, anxious appearing, intermittently tearful, smiles at mention of her children and dog.  Flat affect and poor eye contact.    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  I am concerned that [name] is very depressed right now.  She has suicidal ideation but is without an active plan.  However, given her past h/o suicide attempt and recent loss of first degree relative to suicide, she is certainly at increased risk of suicide.  She and her husband are adamantly opposed to hospitalization do not believe she would qualify for an involuntary committment at this time as she denies active plans and is able to contract for safety.  I have agreed to her going home with increased dose of citalopram and lamotrigine, under the condition that she see me in 1 week for followup. Greater than 30 minutes spent face to face, more than 50% in counselling. Orders: [facility]- Est  Level 4  ([address])   Complete Medication List: 1)  Prenatal Vitamins 60-0.8 Mg Tabs (Prenatal multivit-min-fe-fa) .Marland Kitchen.. 1 tablet daily 2)  Lamotrigine 100 Mg Tabs (Lamotrigine) .... 2 tabs by mouth daily 3)  Ferrous Sulfate 324 Mg Tbec (Ferrous sulfate) .... One tablet daily for one week, then twice a day for anemia due to menstrual blood loss 4)  Citalopram Hydrobromide 40 Mg Tabs (Citalopram hydrobromide) .Marland Kitchen.. 1-1/2 tabs by mouth daily   Patient Instructions: 1)  I have increased your Celexa to 60 mg daily and your Lamictal to 200 mg daily. 2)  If you feel like you might hurt yourself, IMMEDIATELY CALL 911. 3)  Please make an appointment with me on Monday, August 24.   Prescriptions: LAMOTRIGINE 100 MG  TABS (LAMOTRIGINE) 2 tabs by mouth daily  #60 x 6   Entered and Authorized by:   Romero Belling MD   Signed by:   Romero Belling MD on 06/07/2008   Method used:   Electronically sent to ...       [address]       [address]       [place], [place]  [address]       Ph: [phone]       Fax: [phone]   RxID:   [id number] CITALOPRAM HYDROBROMIDE 40 MG  TABS (CITALOPRAM HYDROBROMIDE) 1-1/2 tabs by mouth daily  #45 x 6   Entered and Authorized by:   Romero Belling MD   Signed by:   Romero Belling MD on 06/07/2008   Method used:   Electronically sent to ...       [address]*       [address]       [place], [place]  [address]       Ph: [phone]       Fax: [phone]   RxID:   [id number]  ]

## 2010-11-21 NOTE — Assessment & Plan Note (Signed)
Summary: severe depression,tcb   Vital Signs:  Patient profile:   40 year old female Height:      68.5 inches Weight:      285 pounds BMI:     42.86 BSA:     2.39 Temp:     98.1 degrees F Pulse rate:   105 / minute BP sitting:   128 / 84  Vitals Entered By: Jone Baseman CMA (May 12, 2010 11:31 AM) CC: severe depression Is Patient Diabetic? No Pain Assessment Patient in pain? yes     Location: all over   Primary Care Provider:  Romero Belling MD  CC:  severe depression.  History of Present Illness: 1. Depression: - Feels like her depression is getting worse - She is taking Seroquel, Lamictal, and Citalopram - She thinks that they are losing their effectiveness - Has been on lots of different medications - Has been through many stressful situations  ROS: endorses depressed mood, anhedonia, decreased energy, problems with sleep, feelings of guilt, crying spells.  Denies SI.  Habits & Providers  Alcohol-Tobacco-Diet     Tobacco Status: current     Tobacco Counseling: to quit use of tobacco products     Cigarette Packs/Day: 0.5  Current Medications (verified): 1)  Lamotrigine 200 Mg Tabs (Lamotrigine) .Marland Kitchen.. 1 Tab By Mouth Two Times A Day 2)  Citalopram Hydrobromide 40 Mg  Tabs (Citalopram Hydrobromide) .Marland Kitchen.. 1-1/2 Tabs By Mouth Daily 3)  Iron Supplement 325 (65 Fe) Mg Tabs (Ferrous Sulfate) .Marland Kitchen.. 1 Tab By Mouth Two Times A Day For Anemia 4)  Seroquel 200 Mg Tabs (Quetiapine Fumarate) .Marland Kitchen.. 1 Tab By Mouth Two Times A Day  Allergies: 1)  ! Hydrocodone  Past History:  Past Medical History: Reviewed history from 11/07/2007 and no changes required. Postpartum Depression  Chronic sinusitis -2005,  G6P5015, SAB 2008  High frequency bil sensorineural hearing loss  History of Gestational DM - G4 (diet controlled)  History of polyhydramnios - G4  History of preterm labor/shortened cervix -G4  Social History: Reviewed history from 07/16/2008 and no changes  required. 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  Disabled from bipolar disorder.  Physical Exam  General:  Vitals reviewed. Alert and oriented x3, obese, no acute distress Lungs:  normal respiratory effort Psych:  good eye contact and flat affect.  Depressed appearing.   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  She is on complicated medicine regimen.  Will refer back to [facility] for evaluation and treatment.  Orders: [facility]- Est Level  3 ([address])  Complete Medication List: 1)  Lamotrigine 200 Mg Tabs (Lamotrigine) .Marland Kitchen.. 1 tab by mouth two times a day 2)  Citalopram Hydrobromide 40 Mg Tabs (Citalopram hydrobromide) .Marland Kitchen.. 1-1/2 tabs by mouth daily 3)  Iron Supplement 325 (65 Fe) Mg Tabs (Ferrous sulfate) .Marland Kitchen.. 1 tab by mouth two times a day for anemia 4)  Seroquel 200 Mg Tabs (Quetiapine fumarate) .Marland Kitchen.. 1 tab by mouth two times a day  Patient Instructions: 1)  It was nice to meet you today 2)  I am going to refer you back to the [facility], please call and schedule an appointment 3)  I have refilled your medicines until you can get in and see them Prescriptions: SEROQUEL 200 MG TABS (QUETIAPINE FUMARATE) 1 tab  by mouth two times a day  #60 x 6   Entered and Authorized by:   Angelena Sole MD   Signed by:   Angelena Sole MD on 05/12/2010   Method used:   Electronically to        [facility]. [address] [phone]* (retail)       [address]       [place], [place]  [address]       Ph: [phone] or [phone]       Fax: [phone]   RxID:   [id number] CITALOPRAM HYDROBROMIDE 40 MG  TABS (CITALOPRAM HYDROBROMIDE) 1-1/2 tabs by mouth daily  #60 x 3   Entered and Authorized by:   Angelena Sole MD   Signed by:   Angelena Sole MD on  05/12/2010   Method used:   Electronically to        [facility] [address] [phone]* (retail)       [address]       [place], [place]  [address]       Ph: [phone] or [phone]       Fax: [phone]   RxID:   [id number] LAMOTRIGINE 200 MG TABS (LAMOTRIGINE) 1 tab by mouth two times a day  #60 x 6   Entered and Authorized by:   Angelena Sole MD   Signed by:   Angelena Sole MD on 05/12/2010   Method used:   Electronically to        [facility] [address] [phone]* (retail)       [address]       [place], [place]  [address]       Ph: [phone] or [phone]       Fax: [phone]   RxID:   [id number]

## 2010-11-21 NOTE — Progress Notes (Signed)
Summary: refil  Phone Note Refill Request Call back at Home Phone [phone] Call back at Work Phone [phone] Message from:  Patient  Refills Requested: Medication #1:  SEROQUEL 100 MG TABS Take as directed pt is now out - [facility] [address]  Initial call taken by: De Nurse,  September 29, 2009 11:46 AM  Follow-up for Phone Call        to pcp. he will be in shortly & will refill Follow-up by: Golden Circle RN,  September 29, 2009 11:56 AM    New/Updated Medications: SEROQUEL 100 MG TABS (QUETIAPINE FUMARATE) 2 tabs by mouth two times a day Prescriptions: SEROQUEL 100 MG TABS (QUETIAPINE FUMARATE) 2 tabs by mouth two times a day  #120 x 3   Entered and Authorized by:   Romero Belling MD   Signed by:   Romero Belling MD on 09/29/2009   Method used:   Electronically to        [facility]  [address] [phone]* (retail)       [address]       [place], [place]  [address]       Ph: [phone]       Fax: [phone]   RxID:   [id number]

## 2010-11-21 NOTE — Letter (Signed)
Summary: Probation Letter  [facility]  [address]   [place], [place] [address]   Phone: [phone]  Fax: [phone]    08/11/2010  Kimberly [name] [address] [place], [place]  [address]  Dear [name],  With the goal of better serving all our patients the [facility] is following each patient's missed appointments.  You have missed at least 3 appointments with our practice.If you cannot keep your appointment, we expect you to call at least 24 hours before your appointment time.  Missing appointments prevents other patients from seeing [name] and makes it difficult to provide you with the best possible medical care.      1.   If you miss one more appointment, we will only give you limited medical services. This means we will not call in medication refills, complete a form, or make a referral for you except when you are here for a scheduled office visit.    2.   If you miss 2 or more appointments in the next year, we will dismiss you from our practice.    Our office staff can be reached at [phone] Monday through Friday from 8:30 a.m.-5:00 p.m. and will be glad to schedule your appointment as necessary.    Thank you.   [facility]

## 2010-11-21 NOTE — Assessment & Plan Note (Signed)
Summary: Kimberly [name],Kimberly [name]      Current Allergies: No known allergies         Complete Medication List: 1)  Prenatal Vitamins 60-0.8 Mg Tabs (Prenatal multivit-min-fe-fa) .Marland Kitchen.. 1 tablet daily 2)  Lamictal 100 Mg Tabs (Lamotrigine) .... One tablet daily for bipolar depression.   please make an appointment. 3)  Ferrous Sulfate 324 Mg Tbec (Ferrous sulfate) .... One tablet daily for one week, then twice a day for anemia due to menstrual blood loss 4)  Celexa 40 Mg Tabs (Citalopram hydrobromide) .... One tablet daily for depression 5)  Diflucan 200 Mg Tabs (Fluconazole) .... One tablet for yeast.    ]

## 2010-11-21 NOTE — Progress Notes (Signed)
Summary: Rx Req  Phone Note Refill Request Message from:  Patient  Refills Requested: Medication #1:  MIRTAZAPINE 30 MG TABS 1 tab by mouth at bedtime with Zolpidem for insomnia. needs this refilled has one pill left.  [facility] [address]  Initial call taken by: Clydell Hakim,  July 08, 2009 4:11 PM  Follow-up for Phone Call        will forward message to MD. Follow-up by: Theresia Lo RN,  July 08, 2009 4:22 PM    Prescriptions: MIRTAZAPINE 30 MG TABS (MIRTAZAPINE) 1 tab by mouth at bedtime with Zolpidem for insomnia  #30 x 1   Entered and Authorized by:   Romero Belling MD   Signed by:   Romero Belling MD on 07/08/2009   Method used:   Electronically to        [facility] [address] [phone]* (retail)       [address]       [place], [place]  [address]       Ph: [phone] or [phone]       Fax: [phone]   RxID:   [id number]

## 2010-11-21 NOTE — Progress Notes (Signed)
Summary: Requesting to speak with Dr. Mannie Stabile  Phone Note Call from Patient Call back at [phone]   Reason for Call: Talk to Nurse Summary of Call: Pt is requesting to speak with Dr. Mannie Stabile regarding her blood being low and possibly having a miscarriage. Initial call taken by: Haydee Salter,  September 03, 2007 9:07 AM  Follow-up for Phone Call        spoke with patient and she was seen at [facility] last week for ultrasound and blood work. has been told she has an abnormal pregnancy. her" blood count "is low she states.  she is to go back on11/14/08 for another  test , [facility] , she states , and will schedule appointment with Dr. Mannie Stabile after that time. she does want to talk to DrMarland Kitchen Mannie Stabile today. offered work in appointment but she declines and would just like for MD to call her. will send message to MD. Follow-up by: Theresia Lo RN,  September 03, 2007 9:21 AM  Additional Follow-up for Phone Call Additional follow up Details #1::        Phone Call Completed. Pt to see me next week.  Additional Follow-up by: Towana Badger MD,  September 03, 2007 9:45 AM

## 2010-11-21 NOTE — Miscellaneous (Signed)
  Clinical Lists Changes  Observations: Added new observation of LIPDGOALSMET: Yes (09/04/2007 16:12) Added new observation of TRIG GOAL: 150 mg/dL (16/07/9603 54:09) Added new observation of HDL GOAL: 40 mg/dL (81/19/1478 29:56) Added new observation of LDL GOAL: 160 mg/dL (21/30/8657 84:69) Added new observation of CHOL GOAL: 200 mg/dL (62/95/2841 32:44) Added new observation of HX HDL<35: no  (09/04/2007 16:12) Added new observation of CHIEF CMPLNT: Lipid Management  (09/04/2007 16:12)       Lipid Management History:      Positive NCEP/ATP III risk factors include current tobacco user.  Negative NCEP/ATP III risk factors include female age less than 77 years old and no family history for ischemic heart disease.    Lipid Assessment/Plan:      Based on NCEP/ATP III, the patient's risk factor category is "0-1 risk factors".  From this information, the patient's calculated lipid goals are as follows: Total cholesterol goal is 200; LDL cholesterol goal is 160; HDL cholesterol goal is 40; Triglyceride goal is 150.  Her LDL cholesterol goal has been met.

## 2010-11-21 NOTE — Assessment & Plan Note (Signed)
Summary: PRENATAL LAB CHARGE

## 2010-11-21 NOTE — Progress Notes (Signed)
Summary: phn msg  Phone Note Call from Patient Call back at Home Phone [phone]   Caller: Spouse-[name] Summary of Call: Wants to talk to Dr. Constance Goltz about taking her off the Morphine. Initial call taken by: Clydell Hakim,  December 16, 2009 8:50 AM  Follow-up for Phone Call        Discussed with husband.  He thinks she still needs narcotic pain relief.  I advised that I did not think that was necessary at this time to continue narcotics since fracture is a 50 old.  Advised Tylenol or Motrin. Follow-up by: Romero Belling MD,  December 16, 2009 1:52 PM

## 2010-11-21 NOTE — Assessment & Plan Note (Signed)
Summary: F/U NUMBNESS TO HAND [facility]   Vital Signs:  Patient Profile:   40 Years Old Female Height:     68.5 inches Weight:      246.5 pounds Temp:     98.2 degrees F Pulse rate:   103 / minute BP sitting:   123 / 87  (left arm)  Pt. in pain?   yes    Location:   left and right wrist , heels    Intensity:   8    Type:       throbbing  Vitals Entered ByJacki Cones RN (April 23, 2007 8:42 AM)                Chief Complaint:  wrist and heel pain.  History of Present Illness: FU Hand numbness - Patient says that the wrist splints have helped, but her hands are still going numb.  She is writing frequently and is still on the computer.  She has gotten wrist rests, gel, but she isn't able to keep up the activity level that she needs.  She notes awaking in the morning and having numb hands.  The sensation is now radiating up the ulnar distribution of both hands.  She has lots of children at home, and carries them regularly.  She complains of shoulder pain, bilaterally, and of neck pain.  The pain is an ache, muscular, and she says that the muscles in her neck are sore.  FU Smoking - She would like to quit.  She had quit, but then started again.  She tried Chantix, but it made her agitated and angry.  She'd like to try nicotine replacement.      Depression History:      The patient presents with symptoms of depression which have been present for greater than two weeks.  The patient denies a depressed mood most of the day and a diminished interest in her usual daily activities.  The patient denies significant weight loss, significant weight gain, insomnia, hypersomnia, psychomotor agitation, psychomotor retardation, fatigue (loss of energy), feelings of worthlessness (guilt), impaired concentration (indecisiveness), and recurrent thoughts of death or suicide.  The patient denies symptoms of a manic disorder including persistently & abnormally elevated mood, abnormally & persistently irritable  mood, less need for sleep, talkative or feels need to keep talking, distractibility, flight of ideas, increase in goal-directed activity, psychomotor agitation, inflated self-esteem or grandiosity, excessive buying sprees, excessive sexual indiscretions, and excessive foolish business investments.        The patient denies that she feels like life is not worth living, denies that she wishes that she were dead, and denies that she has thought about ending her life.  Due to her current symptoms, it often takes extra effort to do the things she needs to do.        Comments:  She is doing much better, she doesn't get mad like before, and life is more manageable.  Depression Treatment History:  Prior Medication Used:   Start Date: Assessment of Effect:   Comments:  Escitalopram     12/21/2006   some improvement     weight gain Desyrel (trazodone)     01/21/2007   some improvement     --     Past Medical History:    Reviewed history from 12/19/2006 and no changes required:       648.44 Postpartum Depression, Chronic sinusitis -2005, G5P5005, High frequency bil sensorineural hearing loss -, History of Gestational DM -  G4 (diet controlled), History of polyhydramnios - G4, History of preterm labor/shortened cervix -G4   Family History:    Reviewed history from 12/19/2006 and no changes required:       Asthma, Diabetes 1st degree, HTN  Social History:    Reviewed history from 12/19/2006 and no changes required:       Lives with husband who is a Education officer, environmental at [employer], 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 Lungs:     Normal respiratory effort, chest expands symmetrically. Lungs are clear to auscultation, no crackles or wheezes. Heart:      Normal rate and regular rhythm. S1 and S2 normal without gallop, murmur, click, rub or other extra sounds. Abdomen:     obese Extremities:     Paresthesia in the ulnar distribution, bilaterally, extending from wrist proximally.  No motor impairment of hands.  Negative Tinnel, but Phalen less than 15s.  Some weakness of extension bilaterally at the shoulder.  Tenderness along rotator cuff of both shoulders.   Shoulder/Elbow Exam  Shoulder Exam:    Right:    Inspection:  Normal    Palpation:  Abnormal       Location:  right bicipital groove    Stability:  stable    Tenderness:  right bicipital groove    Swelling:  no    Erythema:  no    Range of Motion:       Flexion-Active: 60    Left:    Inspection:  Normal    Palpation:  Abnormal       Location:  left bicipital groove    Stability:  stable    Tenderness:  right bicipital groove    Swelling:  no    Erythema:  no    Range of Motion:       Flexion-Active: 60    Normal UE ROM  Elbow Exam:    Right:    Inspection:  Normal    Palpation:  Abnormal       Location:  right medial epicondyle    Stability:  stable    Tenderness:  right medial epicondyle    Swelling:  no    Erythema:  no    Left:    Inspection:  Normal    Palpation:  Abnormal       Location:  left medial epicondyle    Stability:  stable    Tenderness:  left medial epicondyle    Swelling:  no    Erythema:  no  Tinel's:    Tinel's negative over cubital, pronator, carpal, and Guyon's area.    Phalen's Compression:    Right < 15 seconds    Left < 15 seconds Pain with varus stress of elbow:    Right negative; Left negative Pain with valgus stress of elbow:    Right negative; Left negative Pivot shift test:    Right negative; Left negative    Impression & Recommendations:  Problem # 1:  TOBACCO ABUSE (ICD-305.1) Assessment: Deteriorated I believe that patient is eating and drinking to compensate for poorly controlled manic depression.  She is  maxed out on Lexapro, and  Seroquel sedates.  She denies manic sx.  I'm going to try adding Zyban.  There is an interaction with Seroquel that potentiates the effects, so I'm going to start her on 150mg  daily and leave it there.  I think she really needs to be seen at the [facility] again, as she isn't doing as well as she should be. 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  As noted above, I feel that she isn't as well controlled as she should be. Will retrun her to [facility], and try some Zyban for her smoking in the meantime. Orders: [facility]- Est  Level 4 ([address])   Problem # 3:  WEIGHT GAIN (ICD-783.1) Assessment: Deteriorated Refer to Wyona Almas, for nutrition consultation. Patient has some medications on board that will add weight, but she seems to be eating more than would be expected.  Orders: [facility]- Est  Level 4 (99214)   Problem # 4:  CARPAL TUNNEL SYNDROME, BILATERAL (ICD-354.0) Assessment: Unchanged She has some relief from the wrist splints.  However, she isn't able to manage with this therapy.  She also added shoulder and neck pain to the ROS.  This may be Carpal Tunnel in origin, but that is morese an median nerve issue and she c/o ulnar distribution.  Trial of NSAIDs and neck films to start. Orders: Diagnostic X-Ray/Fluoroscopy (Diagnostic X-Ray/Flu) [facility]- Est  Level 4 (60454)    Patient Instructions: 1)  Please schedule a follow-up appointment in 1 month.

## 2010-11-21 NOTE — Assessment & Plan Note (Signed)
Summary: FU/KH  Medications Added TRAZODONE HCL 100 MG  TABS (TRAZODONE HCL) Take a tablet at bedtime.      Allergies Added: NKDA  Vital Signs:  Patient Profile:   40 Years Old Female Height:     68.5 inches Weight:      235 pounds Temp:     98.4 degrees F Pulse rate:   92 / minute BP sitting:   113 / 72  Pt. in pain?   yes    Location:   right hand    Intensity:   6    Type:       dull  Vitals Entered ByJacki Cones RN (May 27, 2007 2:34 PM)                Chief Complaint:  f/u hand pain and numbness in hands and talk about quitting smoking.  History of Present Illness: FU Amenorrhea - Patient had period last month, 3rd of the month.  She is 2 days late.  She has regular monthly cycles, but they vary from 28 days to 31 days.  She is concerned that she might be pregnant.   Depression History:      The patient presents with symptoms of depression which have been present for greater than two weeks.  The patient is having a depressed mood most of the day and has a diminished interest in her usual daily activities.  Positive alarm features for depression include insomnia and psychomotor agitation.  However, she denies significant weight loss, significant weight gain, hypersomnia, psychomotor retardation, fatigue (loss of energy), feelings of worthlessness (guilt), impaired concentration (indecisiveness), and recurrent thoughts of death or suicide.  Positive alarm features for a manic disorder include persistently & abnormally elevated mood, abnormally & persistently irritable mood, less need for sleep, and talkative or feels need to keep talking.  She denies distractibility, flight of ideas, increase in goal-directed activity, psychomotor agitation, inflated self-esteem or grandiosity, excessive buying sprees, excessive sexual indiscretions, and excessive foolish business investments.        The patient denies that she feels like life is not worth living, denies that she wishes  that she were dead, and denies that she has thought about ending her life.  Due to her current symptoms, it often takes extra effort to do the things she needs to do.         Depression Treatment History:  Prior Medication Used:   Start Date: Assessment of Effect:   Comments:  Escitalopram     12/21/2006   some improvement     weight gain Desyrel (trazodone)     01/21/2007   some improvement     --   Current Allergies: No known allergies     Risk Factors:  PAP Smear History:     Date of Last PAP Smear:  08/22/2006    Results:  normal     Physical Exam  General:     Well-developed,well-nourished,in no acute distress; alert,appropriate and cooperative throughout examination Lungs:     Normal respiratory effort, chest expands symmetrically. Lungs are clear to auscultation, no crackles or wheezes. Heart:     Normal rate and regular rhythm. S1 and S2 normal without gallop, murmur, click, rub or other extra sounds. Abdomen:     Bowel sounds positive,abdomen soft and non-tender without masses, organomegaly or hernias noted. Extremities:     No clubbing, cyanosis, edema, or deformity noted with normal full range of motion of all joints.   Neurologic:  No cranial nerve deficits noted. Station and gait are normal. Plantar reflexes are down-going bilaterally. DTRs are symmetrical throughout. Sensory, motor and coordinative functions appear intact. Psych:     Patient with pressured conversation, anxious.  Oriented X3, memory intact for recent and remote, normally interactive, and good eye contact.      Impression & Recommendations:  Problem # 1:  WEIGHT GAIN (ICD-783.1) Assessment: Improved Patient is seeing [facility], and has been taken off of Seroquel. She's lost weight, but is agitated.  Will follow along with cessation of medication.  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  She's clearly agitated.  Will follow recommendations from [facility].  I've agreed not to try and co-manage this issue with them.  Orders: [facility]- Est  Level 4 (75643)   Problem # 3:  Preventive Health Care (ICD-V70.0) Patient is trying to get pregnant.  I have advised her that pregnancy at this time, given her psych history, maternity history and such, that getting pregnant wouldn't be in her best interest.  She understands, but is moving forward.  Problem # 4:  ABSENCE, MENSTRUATION (ICD-626.0) Negative upreg.  Will follow.  Orders: [facility] Preg-[facility] (81025) [facility]- Est  Level 4 (32951)   Problem # 5:  TOBACCO ABUSE (ICD-305.1) Patient was anxious on Chantix, and Zyban didn't work.  She'd like to try nictotine replacement. Will investigate other options for paying for this.  Orders: [facility]- Est  Level 4 (88416)   Complete Medication List: 1)  Trazodone Hcl 100 Mg Tabs (Trazodone hcl) .... Take a tablet at bedtime.  Other Orders: Mammogram (Screening) (Mammo)   Patient Instructions: 1)  Please schedule a follow-up appointment in 3 months.    Prescriptions: TRAZODONE HCL 100 MG  TABS (TRAZODONE HCL) Take a tablet at bedtime.  #34 x 0   Entered and Authorized by:   Towana Badger MD   Signed by:   Towana Badger MD on 05/27/2007   Method used:   Print then Give to Patient   RxID:   [id number]       Laboratory Results   Urine Tests  Date/Time Recieved: May 27, 2007 2:33 PM  Date/Time Reported: May 27, 2007 2:48 PM     Urine HCG: negative Comments: ...................................................................DONNA [facility]  May 27, 2007 2:48 PM     Preventive Care Screening  Pap Smear:    Date:  08/22/2006    Next Due:  08/2008    Results:  normal   Prior Values:    Pap Smear:  Done. (08/22/2006)

## 2010-11-21 NOTE — Progress Notes (Signed)
Summary: [place] request  Phone Note Call from Patient   Caller: Husband, [name] Summary of Call: wants to speak with someone about getting his wife seen for going numb Initial call taken by: Haydee Salter,  February 12, 2007 3:08 PM  Follow-up for Phone Call        she had appt next week but he will not be available to bring her. cxld it & resched. has numb hands & thinks it is carpel tunnle Follow-up by: Golden Circle RN,  February 12, 2007 3:16 PM

## 2010-11-21 NOTE — Miscellaneous (Signed)
Summary: Morphine  Clinical Lists Changes  Medications: Added new medication of MORPHINE SULFATE CR 15 MG XR12H-TAB (MORPHINE SULFATE) 1 tab by mouth two times a day for pain - Signed Rx of MORPHINE SULFATE CR 15 MG XR12H-TAB (MORPHINE SULFATE) 1 tab by mouth two times a day for pain;  #20 x 0;  Signed;  Entered by: Romero Belling MD;  Authorized by: Romero Belling MD;  Method used: Print then Give to Patient    Prescriptions: MORPHINE SULFATE CR 15 MG XR12H-TAB (MORPHINE SULFATE) 1 tab by mouth two times a day for pain  #20 x 0   Entered and Authorized by:   Romero Belling MD   Signed by:   Romero Belling MD on 12/09/2009   Method used:   Print then Give to Patient   RxID:   [id number]

## 2010-11-21 NOTE — Letter (Signed)
Summary: Generic Letter  [facility]  [address]   [place], [place] [address]   Phone: [phone]  Fax: [phone]    07/18/2007  Sandford Craze [address] [place], [place]  [address]  Dear Milford Cage or Estill Batten,  Mrs. Merlene Pulling needs an exercise regimen for her health.  She cannot afford a gym membership.  As a [employer] member, I understand you may be able to sponsor her for a membership.  Please help as you are able.   Sincerely,   Towana Badger MD [facility]

## 2010-11-21 NOTE — Progress Notes (Signed)
Summary: Appt  Phone Note Call from Patient Call back at [phone]   Summary of Call: Pt wants to be seen to follow up from miscarriage, but pcp is booked until Feb.  Pt is not wanting to wait that long. Initial call taken by: Haydee Salter,  October 30, 2007 11:04 AM  Follow-up for Phone Call        appt made for 1/16 Follow-up by: Golden Circle RN,  October 30, 2007 11:08 AM

## 2010-11-21 NOTE — Progress Notes (Signed)
Summary: phn msg  Phone Note Call from Patient Call back at Home Phone [phone]   Caller: Patient Summary of Call: pt wants to know that she is very hurt that he told he her that she needed to take Tylenol instead of stronger meds - she wanted to let him  know that she is upset and if he knew the situation, that he wouldn't have taken her off the meds.  (crying)    Initial call taken by: De Nurse,  December 20, 2009 9:46 AM

## 2010-11-21 NOTE — Miscellaneous (Signed)
Summary: Medication Reconciliation  Clinical Lists Changes  Medications: Added new medication of LEXAPRO 20 MG  TABS (ESCITALOPRAM OXALATE) One tablet daily - Signed Rx of LEXAPRO 20 MG  TABS (ESCITALOPRAM OXALATE) One tablet daily;  #34 x 6;  Signed;  Entered by: Towana Badger MD;  Authorized by: Towana Badger MD;  Method used: Historical    Prescriptions: LEXAPRO 20 MG  TABS (ESCITALOPRAM OXALATE) One tablet daily  #34 x 6   Entered and Authorized by:   Towana Badger MD   Signed by:   Towana Badger MD on 07/14/2007   Method used:   Historical   RxID:   [id number]

## 2010-11-21 NOTE — Progress Notes (Signed)
Summary: Update from [facility]  Phone Note From Other Clinic   Caller: Kimberly [name] Call For: Kimberly [name] Summary of Call: Patient underwent another [name] at [facility] today which showed that pt's pregnancy was not progressing.  Beta was 700.  She is for a repeat US this Friday and possible D&C. Initial call taken by: Dennison Nancy RN,  August 27, 2007 4:46 PM  Follow-up for Phone Call        Phone call completed, Provider notified. Follow-up by: Towana Badger MD,  August 28, 2007 9:50 AM

## 2010-11-21 NOTE — Miscellaneous (Signed)
Summary: Patient reinstatement  Clinical Lists Changes Patient called today to express concern over receiving a letter of dismissal for her and her husband, Meah Jiron.  I explained that it was due to their frequent missed appointments.  I explained our [facility] policy and actually read to her the dates in the computer that showed her as "no showing".  She adamately denied missing any appointments stating that she called to cancel every time.   She begged to remain as a patient.  I told her I would have to discuss this with our medical director and would have to call her back.  After discussing the situation with Dr. Deirdre Priest we decided to reinstate her but make it clear to her that if she or Cherly Hensen had any additional missed appointments they would be dismissed permenantly.  I called back this afternoon and explained this to Mr. Schreurs.  He acknowleged understanding and was grateful.   Dennison Nancy RN  September 06, 2008 5:10 PM

## 2010-11-21 NOTE — Progress Notes (Signed)
Summary: [place] request  Phone Note Call from Patient Call back at Home Phone [phone]   Reason for Call: Talk to Nurse Summary of Call: pt is needing to be seen for hand numbness Initial call taken by: Haydee Salter,  January 20, 2007 11:17 AM  Follow-up for Phone Call        arms and hands go numb when lying down. discussed the dip in her mattress, pillows and wrist splints. she wanted to wait until spouse in to see PCP.  Follow-up by: Golden Circle RN,  January 20, 2007 11:25 AM

## 2010-11-21 NOTE — Assessment & Plan Note (Signed)
Summary: f/u,tcb   Vital Signs:  Patient profile:   40 year old female Height:      68.5 inches Weight:      232 pounds Temp:     98.1 degrees F Pulse rate:   102 / minute BP sitting:   109 / 74  Vitals Entered By: Jone Baseman CMA (Mar 15, 2009 2:47 PM) CC: f/u meds Pain Assessment Patient in pain? yes     Location: back Intensity: 10   History of Present Illness: 40 yo female here for:  DEPRESSION.  Sad and crying almost daily. Forgetful, no suicidal ideation.  No joy.  Feels better than before, but citalopram and lamictal not as effective as she'd like.  INSOMNIA.  Chronic.  Nightly difficulty falling asleep.  Formerly on Ambien.  No refills left.  Low energy a lot.  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 2 Tabs By Mouth Daily 2)  Citalopram Hydrobromide 40 Mg  Tabs (Citalopram Hydrobromide) .Marland Kitchen.. 1-1/2 Tabs By Mouth Daily 3)  Zolpidem Tartrate 10 Mg Tabs (Zolpidem Tartrate) .Marland Kitchen.. 1 Tablet By Mouth At Bedtime As Needed For Insomnia  Allergies (verified): No Known Drug Allergies  Family History:    Reviewed history from 12/19/2006 and no changes required:       Asthma, Diabetes 1st degree, HTN  Social History:    Reviewed history from 07/16/2008 and no changes required:       Lives with husband who is a Education officer, environmental at [employer], has  5  children, housewife, former smoker (1 ppd x 20 yrs), quit smoking recently.  Disabled from bipolar disorder.    Smoking Status:  current    Packs/Day:  1.0  Physical Exam  General:  Alert and oriented x3, obese, no acute distress Lungs:  Clear to auscultation bilateral with normal work of breathing. Heart:  Regular rate and rhythm without murmurs, rubs, or gallops.  Normal precordium. Psych:  Cognition appears intact.  No apparent delusions, illusions, hallucinations.  Tangential.  Poor eye contact.  Affect flat.   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  Have also tried to admit to psych hospital without effect.  Patient and husband resistant to both.  Do not feel comfortable increasing medications any more.  Needs to be seen by psychiatrist for further treatment. Orders: [facility]- Est  Level 4 (24401)  Problem # 2:  INSOMNIA, PERSISTENT (ICD-307.42) Assessment: Deteriorated Chronic problem.  Likely related to other psych issues.  Needs to see psychiatry.  Ambien refilled today. Orders: [facility]- Est  Level 4 (02725)  Complete Medication List: 1)  Lamotrigine 100 Mg Tabs (Lamotrigine) .... 2 tabs by mouth daily 2)  Citalopram Hydrobromide 40 Mg Tabs (Citalopram hydrobromide) .Marland Kitchen.. 1-1/2 tabs by  mouth daily 3)  Zolpidem Tartrate 10 Mg Tabs (Zolpidem tartrate) .Marland Kitchen.. 1 tablet by mouth at bedtime as needed for insomnia  Patient Instructions: 1)  I have refilled your sleep medicine. 2)  I have refilled your Lamotrigine and Citalopram, but don't feel comfortable switching these or increasing these any more.  As I indicated before, I believe you need to be taken care of by a psychiatrist.  You need to call the following number to arrange an appointment at [facility]:  [phone]. Prescriptions: LAMOTRIGINE 100 MG  TABS (LAMOTRIGINE) 2 tabs by  mouth daily  #60 x 6   Entered and Authorized by:   Romero Belling MD   Signed by:   Romero Belling MD on 03/15/2009   Method used:   Print then Give to Patient   RxID:   [id number] CITALOPRAM HYDROBROMIDE 40 MG  TABS (CITALOPRAM HYDROBROMIDE) 1-1/2 tabs by mouth daily  #45 x 6   Entered and Authorized by:   Romero Belling MD   Signed by:   Romero Belling MD on 03/15/2009   Method used:   Print then Give to Patient   RxID:   [id number] ZOLPIDEM TARTRATE 10 MG TABS (ZOLPIDEM TARTRATE) 1 tablet by mouth at bedtime as needed for insomnia  #30 x 3   Entered and Authorized by:   Romero Belling MD   Signed by:   Romero Belling MD on 03/15/2009   Method used:   Print then Give to Patient   RxID:   [id number]

## 2010-11-21 NOTE — Progress Notes (Signed)
  Phone Note Outgoing Call Call back at Work Phone [phone]   Call placed by: Towana Badger MD,  July 05, 2007 9:35 AM Action Taken: Phone Call Completed Summary of Call: Discussed recent change of Trazodone.  Followed up to see how patient was feeling.  Asked patient to be sure to finish up 200mg  daily from her prior supply, to make sure that she's on a 200mg  regimen stably before the 300mg  at bedtime regimen.  She notes feeling well today, slept all night long for the first time in 8 months.  Awoke this morning and vomited nonbloody, nonbilious emesis.  Patient reiterated understanding to try 200mg  at bedtime for the next week, then try 300mg  at bedtime if needed, and to make appt to see me back in 10 days. Initial call taken by: Towana Badger MD,  July 05, 2007 9:37 AM

## 2010-11-21 NOTE — Progress Notes (Signed)
Summary: triage  Phone Note Call from Patient Call back at Home Phone [phone]   Caller: Patient Summary of Call: is lifeless/no energy/depressed wants to come in to see Constance Goltz.  no appt until 4/13 Initial call taken by: De Nurse,  January 20, 2009 1:48 PM  Follow-up for Phone Call        states she can barely get out of bed. feels like no energy. also thinks it may be from low iron. denies SI or HI. children have lost Medicaid now that she has disability. very upset about this. unable to come in today. agreed to be here at 8:20 tomorrow. aware that pcp will not be here. receptive to meds. told her about [place] mental health [place]. told her & her spouse to call 911 if worse/go to ED. they agreed Follow-up by: Golden Circle RN,  January 20, 2009 2:04 PM  Additional Follow-up for Phone Call Additional follow up Details #1::        Agreed. Additional Follow-up by: Romero Belling MD,  January 20, 2009 9:44 PM      Appended Document: triage Have tried to have [name] admitted to psychiatric inpatient at least twice, but she and husband have been resistant.  Have never seen any overt evidence that patient is danger to self or others and thus have not been able to admit involuntarily.  Patient and husband both have unrealistic belief that he is able to care for her.

## 2010-11-21 NOTE — Assessment & Plan Note (Signed)
Summary: DNKA   DPG   

## 2010-11-21 NOTE — Assessment & Plan Note (Signed)
Summary: 3:00 F/U PER Clarice Bonaventure/[facility]   Vital Signs:  Patient Profile:   40 Years Old Female Height:     68.5 inches Weight:      251.2 pounds BMI:     37.78 Temp:     98.2 degrees F Pulse rate:   105 / minute BP sitting:   120 / 83  Pt. in pain?   yes    Location:   all over    Intensity:   10  Vitals Entered By: Golden Circle RN (November 19, 2007 3:11 PM)                  Chief Complaint:  meds & f/u.  History of Present Illness: HPI/ROS New Complaint: location: psych quality:  mood changes, depressed and then manic severity/duration:  moderate, remote onset. ROS: Denies hallucinations, suicidality.  Irritibility, tearfullness.  No neuromotor focality, fever, visual changes. Past history/Family History of this problem:  Longstanding history.  Stopped all medications secondary to pregnancy, per patient.  Miscarriage, now patient back for new regimen.  Tobacco Use:  See Risk Factors and CPOE.   Obesity:  BMI noted on VS and CPOE where applicable.   Current Allergies: No known allergies     Risk Factors:  Tobacco use:  current    Cigarettes:  Yes -- 1/3 pack(s) per day    Counseled to quit/cut down tobacco use:  yes Passive smoke exposure:  no Drug use:  no HIV high-risk behavior:  no Alcohol use:  no Exercise:  no  Family History Risk Factors:    Family History of MI in females < 77 years old:  no    Family History of MI in males < 28 years old:  no  PAP Smear History:    Date of Last PAP Smear:  08/22/2006    Physical Exam  General:     alert, no distres, morbidly obese. Lungs:     normal Heart:     normal Neurologic:     alert & oriented X3.  no tremor or focality. Psych:     Oriented X3, good eye contact, and not depressed appearing.  Judgement and insight appear intact.  Denies suicidality.     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  66m spent. Will begin Lamictal.  Dose-pack ordered.  RTC in 2 weeks.  May increase suicidality. Advise if concerned about pregnancy. Orders: [facility]- Est  Level 4 ([address])   Problem # 2:  OBESITY, MORBID (ICD-278.01) Assessment: Deteriorated advised nutrition recs and gave weightwatchers handout. Orders: [facility]- Est  Level 4 ([address]454)   Problem # 3:  TOBACCO ABUSE (ICD-305.1) Assessment: Unchanged advised cessation. hopefully improved psych control will help. patient refused Chantix. Orders: [facility]- Est  Level 4 (09811)   Complete Medication List: 1)  Prenatal Vitamins 60-0.8 Mg Tabs (Prenatal multivit-min-fe-fa) .Marland Kitchen.. 1 tablet daily 2)  Lamictal Starter 25 (42)-100 (7) Mg Kit (Lamotrigine) .... Take as directed for bipolar depression   Patient Instructions: 1)  Please schedule a follow-up appointment in 2 weeks. 2)  Call imediately at the first sign of rash. 3)  Call immediately if you think you are pregnant. 4)  Remember that we discussed the increased risk of suicide when starting this mood therapy.    Prescriptions: LAMICTAL STARTER 25 (42)-100 (7) MG  KIT (LAMOTRIGINE) Take as directed for bipolar depression  #1 x 0   Entered  and Authorized by:   Towana Badger MD   Signed by:   Towana Badger MD on 11/20/2007   Method used:   Electronically sent to ...       [address]*       [address]       [place], [place]  [address]       Ph: [phone]       Fax: [phone]   RxID:   [id number]  ]

## 2010-11-21 NOTE — Assessment & Plan Note (Signed)
Summary: dnka      Current Allergies: No known allergies         Complete Medication List: 1)  Prenatal Vitamins 60-0.8 Mg Tabs (Prenatal multivit-min-fe-fa) .Marland Kitchen.. 1 tablet daily 2)  Lamotrigine 100 Mg Tabs (Lamotrigine) .... 2 tabs by mouth daily 3)  Ferrous Sulfate 324 Mg Tbec (Ferrous sulfate) .... One tablet daily for one week, then twice a day for anemia due to menstrual blood loss 4)  Citalopram Hydrobromide 40 Mg Tabs (Citalopram hydrobromide) .Marland Kitchen.. 1-1/2 tabs by mouth daily    ]

## 2010-11-21 NOTE — Letter (Signed)
Summary: Pap -- normal  [facility]  [address]   [place], [place] [address]   Phone: [phone]  Fax: [phone]    10/01/2008  [address] [place], [place]  [address]  Dear [name],   The following are the results of your recent test(s):  Test     Result     Pap Smear    Normal___X____  Not Normal_____       Comments:  Repeat in 1 year.  Sincerely,  Romero Belling MD [facility]           Appended Document: Pap -- normal sent

## 2010-11-21 NOTE — Progress Notes (Signed)
Summary: Triage  Phone Note Call from Patient Call back at Work Phone [phone]   Summary of Call: Pt wants to speak with a rn about possibly getting seen on Monday, by Dr. Corrie Dandy Jones(will be primary md), and she states she will elaborate to rn. Initial call taken by: Haydee Salter,  May 27, 2008 11:14 AM  Follow-up for Phone Call        FYI: had all top teeth pulled 2 weeks ago. will get dentures in 2 months wants to be seen byDr. Yetta Barre monday 10th at 1:30 when spouse has appt. told her we have no openings until 3pm. she has another appt at 4pm. message to MD to see if she can be double booked. pt wants to f/u on meds for depression, "growths on legs" Follow-up by: Golden Circle RN,  May 27, 2008 11:17 AM  Additional Follow-up for Phone Call Additional follow up Details #1::        spoke with Kennon Rounds about this.  Pt is [employer] and really needs to f/u with PCP about depression and other chronic medical problems. Additional Follow-up by: Sylvan Cheese MD,  May 27, 2008 11:37 AM

## 2010-11-21 NOTE — Progress Notes (Signed)
Summary: refill meds  Phone Note Call from Patient Call back at Home Phone [phone]   Caller: Patient Summary of Call: is running out of morphine and wants to take more since the pain comes back too soon - wants it refill for more had cast replaced today and it is very painful Initial call taken by: De Nurse,  December 15, 2009 12:06 PM  Follow-up for Phone Call        No--this is 1 month after fracture, time to transition to non-narcotic pain meds.  Please advise OTC Ibuprofen 400 mg every 6 hours as needed. Follow-up by: Romero Belling MD,  December 15, 2009 1:40 PM  Additional Follow-up for Phone Call Additional follow up Details #1::        spoke with husband about needing to use otc ibuprofen. she was asleep & he did not want to wake her. said he would tell her Additional Follow-up by: Golden Circle RN,  December 15, 2009 2:16 PM

## 2010-11-21 NOTE — Assessment & Plan Note (Signed)
Summary: cpe,tcb   Vital Signs:  Patient profile:   40 year old female Height:      68.5 inches Weight:      297.31 pounds BMI:     44.71 BSA:     2.43 Temp:     98.1 degrees F Pulse rate:   106 / minute BP sitting:   117 / 87  Vitals Entered By: Delora Fuel (September 04, 2010 9:40 AM) CC: CPE Is Patient Diabetic? No Pain Assessment Patient in pain? no        Primary Provider:  Angelena Sole MD  CC:  CPE.  History of Present Illness: 1. h/o bipolar d/o Patient very wordy today. Came for Pap and physical but p/w multiple issues that she wrote down. When asked prioritize: "My main worry I can't remember things at all. Nervous all the time". Patient appropriate to questions but then goes off on tangents. Has been feeling like this for past several years, ever since her mother's death.  Psych follow-up? Patient has not followed-up at [facility] for past year. After changing from Medicaid to Medicare, she is no longer accepted as patient there.  Functional difficulty? Finds it difficult to function. Does not feel like doing anything. Her husband cooks, manages household, and makes sure she takes meds. Husband seems very supportive and patient is appreciative of him.  Compliant with meds? Yes but takes all at one time (instead of divided during day).  SIGECAPS: decreased sleep (4 hrs a night), finds difficultly fall and stay asleep, legs bother her; does not feel like doing anything; increased appetite, eating all the time; denies SI/HI Hallucinations? Visual and auditory intermittently all the time. Sees black spots in room. Hears screaming in head some times. Words not understandable.  Substance abuse? Now smoking 2 ppd. Denies alcohol/illicit substances  PH9Q: 24/27 (scored 3's on everything except 0 on SI/HI).   MDQ: answered "yes" for increased irritability, racing thoughts, distractability.    Habits & Providers  Alcohol-Tobacco-Diet     Tobacco Status:  current     Tobacco Counseling: to quit use of tobacco products     Cigarette Packs/Day: 2.0     Year Quit: 9/09     Pack years: 10     Passive Smoke Exposure: no  Exercise-Depression-Behavior     Have you felt down or hopeless? yes     Have you felt little pleasure in things? yes     Depression Counseling: further diagnostic testing and/or other treatment is indicated  Current Medications (verified): 1)  Lamotrigine 200 Mg Tabs (Lamotrigine) .Marland Kitchen.. 1 Tab By Mouth Two Times A Day 2)  Citalopram Hydrobromide 40 Mg  Tabs (Citalopram Hydrobromide) .Marland Kitchen.. 1-1/2 Tabs By Mouth Daily 3)  Iron Supplement 325 (65 Fe) Mg Tabs (Ferrous Sulfate) .Marland Kitchen.. 1 Tab By Mouth Two Times A Day For Anemia 4)  Seroquel 200 Mg Tabs (Quetiapine Fumarate) .Marland Kitchen.. 1 Tab By Mouth Two Times A Day 5)  Prevident 5000 Sensitive 1.1-5 % Pste (Sod Fluoride-Potassium Nitrate) .... Use As Toothpaste.  Allergies: 1)  ! Hydrocodone  Social History: Reviewed history from 07/16/2008 and no changes required. 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  Disabled from bipolar disorder.Packs/Day:  2.0  Physical Exam  General:  agitated, dishevelled, tearful Head:  normocephalic and atraumatic.   Eyes:  vision grossly intact.   Neck:  no masses.   Lungs:  normal respiratory effort, normal breath sounds, and  no wheezes.   Heart:  normal rate, regular rhythm, no murmur, no gallop, and no rub.   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 But appears to be her chronic state. Discussed options  with patient. Patient denies needing/wanting to be hospitalized at this time. Patient repeatedly answered multiple times that she filled safe. Suicidal ideation 3 years ago after death of her mother; considered overdosing on her medicines; but no episodes since then. Gave her number for [facility] line. Strongly encouraged her to make an appointment ASAP for treatment, evaluation, and to get her meds organized. Also advised her to call crisis line if she felt like hurting herself or other or if she felt unsafe. Will schedule f/u in 2 wks to see if she arranged to see psych and to assess her bipolar dz.   Orders: [facility]- Est Level  3 ([address]78)  Problem # 2:  Preventive Health Care (ICD-V70.0) Asked to make another appointment to get Pap.   Complete Medication List: 1)  Lamotrigine 200 Mg Tabs (Lamotrigine) .Marland Kitchen.. 1 tab by mouth two times a day 2)  Citalopram Hydrobromide 40 Mg Tabs (Citalopram hydrobromide) .Marland Kitchen.. 1-1/2 tabs by mouth daily 3)  Iron Supplement 325 (65 Fe) Mg Tabs (Ferrous sulfate) .Marland Kitchen.. 1 tab by mouth two times a day for anemia 4)  Seroquel 200 Mg Tabs (Quetiapine fumarate) .Marland Kitchen.. 1 tab by mouth two times a day 5)  Prevident 5000 Sensitive 1.1-5 % Pste (Sod fluoride-potassium nitrate) .... Use as toothpaste.  Patient Instructions: 1)  Please call [facility] to make an appointment as soon as possible.  2)  Also, please call their number if you do not feel safe. 3)  If you cannot get an appointment within the next 2 weeks, please call me, Dr. Priscella Mann, at our clinic [phone]) and let me know, and I will be happy be arrange something else for you. 4)  Because we did not have time to do a physical today, please make another appointment to get this done. And please do not miss your appointments! Prescriptions: PREVIDENT 5000 SENSITIVE 1.1-5 % PSTE (SOD FLUORIDE-POTASSIUM NITRATE) Use as toothpaste.  #1 x 3   Entered and Authorized by:   Priscella Mann MD   Signed  by:   Lucianne Muss Park MD on 09/04/2010   Method used:   [facility]ically to        [facility]  [address] [id number]       [address]       [place], [place]  [address]       Ph: [phone]       Fax: [phone]   RxID:   [id number]    Orders Added: 1)  [facility]- Est Level  3 [[address]]

## 2010-11-21 NOTE — Miscellaneous (Signed)
Summary: DNKA               

## 2010-11-21 NOTE — Assessment & Plan Note (Signed)
Summary: feet busting open/knee pain due to fall,tcb   Vital Signs:  Patient profile:   40 year old female Height:      68.5 inches Weight:      292.9 pounds BMI:     44.05 Temp:     98.1 degrees F oral Pulse rate:   101 / minute BP sitting:   134 / 88  (right arm) Cuff size:   large  Vitals Entered By: Garen Grams LPN (December 02, 2009 11:04 AM) CC: pain in rt great toe Is Patient Diabetic? No Pain Assessment Patient in pain? yes     Location: rt great toe   Primary Care Provider:  Romero Belling MD  CC:  pain in rt great toe.  History of Present Illness: RIGHT GREAT TOE PAIN.  Fell down stairs 2 weeks ago.  Injured toe and has been very painful and swollen ever since.  Is able to move it a little, but very painful to walk.  Using ice and husband's pain patches with minimal relief.  HYPERGLYCEMIA.  BG 101 in 12/201.  This is borderline, but patient presents with concern for nonhealing cracks in her feet.  Habits & Providers  Alcohol-Tobacco-Diet     Tobacco Status: current  Allergies (verified): 1)  ! Hydrocodone  Physical Exam  General:  Alert and oriented x3, obese, no acute distress Msk:  Swollen and red RIGHT great toe.  Tender at MTP joint. Limited ROM. Skin:  Cracked skin on bilateral feet   Impression & Recommendations:  Problem # 1:  ? of FRACTURE, RIGHT GREAT TOE (ICD-826.0) Assessment New Xray, Morphine, Ice, Post Op show.  Will call with results of xray--if fracture would refer to podiatry. Orders: Diagnostic X-Ray/Fluoroscopy (Diagnostic X-Ray/Flu) Post op shoe- [facility] (L3080) [facility]- Est  Level 4 (98119)  Problem # 2:  HYPERGLYCEMIA, BORDERLINE (ICD-790.29) Assessment: New  Orders: A1C-[facility] (14782) [facility]- Est  Level 4 (95621)  Complete Medication List: 1)  Lamotrigine 200 Mg Tabs (Lamotrigine) .Marland Kitchen.. 1 tab by mouth two times a day 2)  Citalopram Hydrobromide 40 Mg Tabs (Citalopram hydrobromide) .Marland Kitchen.. 1-1/2 tabs by mouth daily 3)  Iron Supplement  325 (65 Fe) Mg Tabs (Ferrous sulfate) .Marland Kitchen.. 1 tab by mouth two times a day for anemia 4)  Seroquel 200 Mg Tabs (Quetiapine fumarate) .Marland Kitchen.. 1 tab by mouth two times a day 5)  Clotrimazole 1 % Crea (Clotrimazole) .... Apply to affected skin two times a day x2 weeks.  disp #1 large tube. 6)  Morphine Sulfate 15 Mg Tabs (Morphine sulfate) .Marland Kitchen.. 1 tab by mouth two times a day as needed for pain  Patient Instructions: 1)  Please get Xray now.  I'll call you with results of xray and blood sugar test (A1c). 2)  Use Morphine sparingly while you heal--no driving or heavy machinery.  No refills on Morphine.  Reports Hydrocodone allergy, so Hydrocodone/APAP prescription torn up and discarded.  Given Rx for Morphine. Prescriptions: MORPHINE SULFATE 15 MG TABS (MORPHINE SULFATE) 1 tab by mouth two times a day as needed for pain  #20 x 0   Entered and Authorized by:   Romero Belling MD   Signed by:   Romero Belling MD on 12/02/2009   Method used:   Print then Give to Patient   RxID:   [id number] HYDROCODONE-ACETAMINOPHEN 5-500 MG TABS (HYDROCODONE-ACETAMINOPHEN) 1 tab by mouth q6h as needed for pain  #30 x 0   Entered and Authorized by:   Romero Belling MD   Signed by:  Romero Belling MD on 12/02/2009   Method used:   Print then Give to Patient   RxID:   [id number]

## 2010-11-21 NOTE — Miscellaneous (Signed)
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

## 2010-11-21 NOTE — Assessment & Plan Note (Signed)
Summary: foot pain wp   Vital Signs:  Patient Profile:   40 Years Old Female Height:     68.5 inches Weight:      239 pounds Pulse rate:   89 / minute BP sitting:   125 / 88  (left arm)  Pt. in pain?   yes    Location:   feet    Intensity:   10    Type:       sore  Vitals Entered ByJacki Cones RN (June 30, 2008 1:41 PM)                  PCP:  Romero Belling MD  Chief Complaint:  heel pain and cracking.  History of Present Illness: CC: bilateral foot pain  presents with husband  1. foot pain - per pt, has been having trouble with feet for 20+ yrs, recently worse last 2 wks, difficulty walking (can't walk on heels, insteald walking on lateral soles of feet.)  has had dry scaly skin, recently cracking badly and deep on soles.  + pain, no itching.  Has tried myriad of OTC meds including but not limited to Lotrimin AF cream, Clotrimazole cream, Tenactin spray, Ped egg, Vaseline with bag overnight for occlusion, and guava cream.  most recently Neosporin x 2wks to help heal latest crack.  Attributes this to walking barefoot in fields as child/teenager.  does not wear heels, nor sneakers.  mostly flip flops and crocs.  For pain has been alternating tylenol and motrin.  No diabetes, no steroids, no immunosuppresion i can see.  no recent fevers/chills, systemic complaints.    Current Allergies: No known allergies     Risk Factors:     Physical Exam  General:     Alert and oriented x3, obese, smiles at mention of Dr. Constance Goltz Skin:     severe dry skin dermatitis with fissures only on soles, not on dorsum or interdigital area.  scaly/callus yellow as well throughout soles bilaterally.    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  r/o severe athelete's foot, although no evidence in interdigital area.  asked pt to return  in 1-2 months.  if no improvement, consider KOH and tx with combination of antifungal powder, po antifungal and topical antifungal.  Orders: [facility]- Est Level  3 ([address]) Podiatry Referral (Podiatry)   Complete Medication List: 1)  Prenatal Vitamins 60-0.8 Mg Tabs (Prenatal multivit-min-fe-fa) .Marland Kitchen.. 1 tablet daily 2)  Lamotrigine 100 Mg Tabs (Lamotrigine) .... 2 tabs by mouth daily 3)  Ferrous Sulfate 324 Mg Tbec (Ferrous sulfate) .... One tablet daily for one week, then twice a day for anemia due to menstrual blood loss 4)  Citalopram Hydrobromide 40 Mg Tabs (Citalopram hydrobromide) .Marland Kitchen.. 1-1/2 tabs by mouth daily 5)  Lac-hydrin Five 5 % Lotn (Ammonium lactate) .... Apply to aa two times a day   Patient Instructions: 1)  I have sent a prescription for Lac-Hydrin.  please use twice daily to bilateral feet. 2)  Return after you see the foot doctors for [name] to see how you are doing. 3)  You can increase your tylenol to 500mg  every six hours to see if it helps with the pain. 4)  I hope you start feeling better.   Prescriptions: LAC-HYDRIN FIVE 5 % LOTN (AMMONIUM LACTATE) apply to aa two times a day  #1 x  1   Entered and Authorized by:   Eustaquio Boyden  MD   Signed by:   Eustaquio Boyden  MD on 06/30/2008   Method used:   Electronically to        [facility]* (retail)       [address]       [place], [place]  [address]       Ph: [phone]       Fax: [phone]   RxID:   [id number]  ]

## 2010-11-21 NOTE — Assessment & Plan Note (Signed)
Summary: per dr Mannie Stabile wp   Vital Signs:  Patient Profile:   40 Years Old Female Height:     68.5 inches Weight:      240.3 pounds BMI:     36.14 Temp:     98.6 degrees F Pulse rate:   101 / minute BP sitting:   119 / 79  (left arm)  Pt. in pain?   yes    Location:   all over    Intensity:   10  Vitals Entered By: Dedra Skeens CMA, (July 18, 2007 9:27 AM)                  Chief Complaint:  f/u per Elizer Bostic.  History of Present Illness: FU Anxiety - Patient is doing well on Geodon 80mg  two times a day.  She's not sleeping well, despite Trazodone 300mg  at bedtime.  She recently stopped her Lexapro for 2 days and "turned into a total bitch."  He husband, apparently, asked her to leave for a few days.  She's back on the medication and doing well.  FU Insomnia - See above.  Patient had sleep study recently.  She still isn't able to get to sleep well, and once asleep she's up after a couple of hours.  total time spent 25m.   Depression History:      The patient presents with symptoms of depression which have been present for greater than two weeks.  The patient denies a depressed mood most of the day and a diminished interest in her usual daily activities.  The patient denies significant weight loss, significant weight gain, insomnia, hypersomnia, psychomotor agitation, psychomotor retardation, fatigue (loss of energy), feelings of worthlessness (guilt), impaired concentration (indecisiveness), and recurrent thoughts of death or suicide.  Positive alarm features for a manic disorder include less need for sleep, talkative or feels need to keep talking, and psychomotor agitation.  She denies persistently & abnormally elevated mood, abnormally & persistently irritable mood, distractibility, flight of ideas, increase in goal-directed activity, inflated self-esteem or grandiosity, excessive buying sprees, excessive sexual indiscretions, and excessive foolish business investments.        The  patient denies that she feels like life is not worth living, denies that she wishes that she were dead, and denies that she has thought about ending her life.  Due to her current symptoms, it often takes extra effort to do the things she needs to do.         Depression Treatment History (Reviewed):  Prior Medication Used:   Start Date: Assessment of Effect:   Comments:  Escitalopram     12/21/2006   some improvement     weight gain Desyrel (trazodone)     01/21/2007   some improvement     --   Current Allergies: No known allergies   Past Medical History:    Reviewed history from 12/19/2006 and no changes required:       648.44 Postpartum Depression, Chronic sinusitis -2005, G5P5005, High frequency bil sensorineural hearing loss -, History of Gestational DM - G4 (diet controlled), History of polyhydramnios - G4, History of preterm labor/shortened cervix -G4  Past Surgical History:    Reviewed history from 12/19/2006 and no changes required:       11-19-06 TC 205, TG 118, HDL 38, LDL 143 - 11/20/2006, Cautery of genital wart - age 47 - 11/22/2004   Family History:    Reviewed history from 12/19/2006 and no changes required:  Asthma, Diabetes 1st degree, HTN  Social History:    Reviewed history from 12/19/2006 and no changes required:       Lives with husband who is a Education officer, environmental at [employer], has 4  children, housewife, former smoker (1 ppd x 20 yrs), quit smoking recently    Risk Factors: Tobacco use:  current    Cigarettes:  Yes -- 1 pack(s) per day Passive smoke exposure:  no Drug use:  no HIV high-risk behavior:  no Alcohol use:  no Exercise:  no  Family History Risk Factors:    Family History of MI in females < 15 years old:  no    Family History of MI in males < 47 years old:  no  PAP Smear History:    Date of Last PAP Smear:  08/22/2006   Review of Systems      See HPI   Physical Exam  General:     Well-developed,well-nourished,in no acute distress;  alert,appropriate and cooperative throughout examination Lungs:     Normal respiratory effort, chest expands symmetrically. Lungs are clear to auscultation, no crackles or wheezes. Heart:     Normal rate and regular rhythm. S1 and S2 normal without gallop, murmur, click, rub or other extra sounds. Psych:     Oriented X3, memory intact for recent and remote, better eye contact, labile affect, less anxious, less easily distracted, and angry.  No active delusions or hallucinations.     Impression & Recommendations:  Problem # 1:  INSOMNIA, PERSISTENT (ICD-307.42) Recommended exercise program. Wrote letter for [employer].  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 Recommended follow-up with Mood Disorders clinic for additional advice.  Orders: [facility]- Est  Level 4 ([address])   Problem # 3:  TOBACCO ABUSE (ICD-305.1) Assessment: Unchanged Patient did not yet start Chantix. Will follow.  Her updated medication list for this problem includes:    Chantix Starting Month Pak 0.5 Mg X 11 & 1 Mg X 42 Misc (Varenicline tartrate) .Marland Kitchen... Take as directed in package insert.  Orders: [facility]- Est  Level 4 ([address])   Problem # 4:  SYMPTOM, APNEA, SLEEP NOS (ICD-780.57) Patient had recent sleep study which was inconclusive secondary to a large number of unprescribed sedatives patient was taking. We should repeat this study now that she's on a more stable regimen.  Orders: [facility]- Est  Level 4 ([address])   Complete Medication List: 1)  Geodon 80 Mg Caps (Ziprasidone hcl) .Marland Kitchen.. 1 tablet twice daily 2)  Trazodone Hcl 300 Mg Tabs (Trazodone hcl) .Marland Kitchen.. 1 tablet at bedtime for insomnia  please do not take any other sleep aids with this. 3)  Chantix Starting Month Pak 0.5 Mg X 11 & 1 Mg X 42 Misc (Varenicline tartrate) .... Take as directed in package insert. 4)  Lexapro  10 Mg Tabs (Escitalopram oxalate) .Marland Kitchen.. 1 tablet a day each morning   Patient Instructions: 1)  Please call Dr. Pascal Lux for an appt. 2)  Please schedule a follow-up appointment in 2 weeks.    Prescriptions: LEXAPRO 10 MG  TABS (ESCITALOPRAM OXALATE) 1 tablet a day each morning  #34 x 6   Entered and Authorized by:   Towana Badger MD   Signed by:   Towana Badger MD on 07/18/2007   Method used:   Electronically sent to .Marland KitchenMarland Kitchen  [facility] [address]*       [address]       [place], [place]  [address]       Ph: [phone]       Fax: [phone]   RxID:   [id number] CHANTIX STARTING MONTH PAK 0.5 MG X 11 & 1 MG X 42  MISC (VARENICLINE TARTRATE) Take as directed in package insert.  #1 x 0   Entered and Authorized by:   Towana Badger MD   Signed by:   Towana Badger MD on 07/18/2007   Method used:   Electronically sent to ...       [facility] [address]       [name]       [place], [place]  [address]       Ph: [phone]       Fax: [phone]   RxID:   [id number] LEXAPRO 20 MG  TABS (ESCITALOPRAM OXALATE) One tablet daily  #34 x 6   Entered and Authorized by:   Towana Badger MD   Signed by:   Towana Badger MD on 07/18/2007   Method used:   Electronically sent to ...       [facility] [address]       [address]       [place], [place]  [address]       Ph: [phone]       Fax: [phone]   RxID:   [id number]  ]

## 2010-11-21 NOTE — Assessment & Plan Note (Signed)
Summary: fu wp   Vital Signs:  Patient Profile:   40 Years Old Female Height:     68.5 inches Weight:      235.8 pounds BMI:     35.46 Temp:     98.4 degrees F Pulse rate:   87 / minute BP sitting:   135 / 90  (left arm)  Pt. in pain?   yes    Location:   body    Type:       aching  Vitals Entered ByJacki Cones RN (July 16, 2008 10:13 AM)                  PCP:  Romero Belling MD  Chief Complaint:  lumps in legs and left side of back painful.  History of Present Illness: History of present illness is detailed by problem in assessment and plan.    Updated Prior Medication List: PRENATAL VITAMINS 60-0.8 MG  TABS (PRENATAL MULTIVIT-MIN-FE-FA) 1 tablet daily LAMOTRIGINE 100 MG  TABS (LAMOTRIGINE) 2 tabs by mouth daily FERROUS SULFATE 324 MG  TBEC (FERROUS SULFATE) One tablet daily for one week, then twice a day for anemia due to menstrual blood loss CITALOPRAM HYDROBROMIDE 40 MG  TABS (CITALOPRAM HYDROBROMIDE) 1-1/2 tabs by mouth daily LAC-HYDRIN FIVE 5 % LOTN (AMMONIUM LACTATE) apply to aa two times a day  Current Allergies: No known allergies    Social History:    Lives with husband who is a Education officer, environmental at [employer], has 5  children, housewife, former smoker (1 ppd x 20 yrs), quit smoking recently.  Disabled from bipolar disorder.   Risk Factors:  Tobacco use:  quit    Year quit:  9/09    Pack-years:  10    Physical Exam  General:     Alert and oriented x3, obese, no acute distress Lungs:     Clear to auscultation bilateral with normal work of breathing. Heart:     Regular rate and rhythm without murmurs, rubs, or gallops.  Normal precordium. Skin:     Intact without suspicious lesions or rashes Psych:     Distracted and anxious appearing.  Mood = "good".  Flat affect, poor eye contact.  No apparent hallucinations or delusions.    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  Denies crying episodes, says she is able to do more around the house now.  States she gets a lot of pleasure from all 5 of her children.  Admits to visual hallucinations (sees shadows and people that others haven't seen) and auditory hallucinations (talks to her dead mother).  States that hallucinations go back to childhood, though the ones with her mother are new since her mother died from suicide earlier this year.  Main complaint is poor sleep.  3 hours of sleep last night, none the night before, 2 hours the night before that.  This has been going on for weeks.  She says she is tired and does not have increased energy.  Denies suicidal thoughts.  Denies homocidal thoughts.  She is seen by her therapist approximately every other week.  Plan:  I am very concerned about [name].  Although her psychotic symptoms are longstanding for several decades and her sleep symptoms may be insomnia, I am concerned that she may be having a manic episode.  She is clearly someone who needs  psychiatric care--I discussed this with her and she is in agreement.  I have provided the number for [facility], and she is to call to establish care with a psychiatrist there.  Last visit I had wanted to admit her for severe depressive symptoms, but she and her husband were both adamantly opposed to this.  I will see her back in 2 weeks to continue to monitor closely.  Orders: [facility]- Est  Level 4 ([address]) Psychiatric Referral (Psych)   Problem # 2:  AMENORRHEA (ICD-626.0) Short period at beginning of month.  09/04 x 1-1/2 days.  This is how her periods are when she is pregnant.  Negative pregnancy test today.  She would like to be pregnant and is adamantly opposed to birth control, she does not believe in it.  Given her serious psychiatric disease, I would be very concerned for her to take on responsibility for another infant at  this time; I offered her contraceptive options, but as stated above, she is opposed to this.  Reviewed her medications, and clarified that she can stay on Lamotrigine if she becomes pregnant.  The current ACOG Practice Bulletin, No 92 (February 09, 2007) states: "Lamotrigine is a potential maintenance therapy option for pregnant women with bipolar disorder because of its protective effects against bipolar depression, general tolerability, and a growing reproductive safety profile relative to alternative mood stabilizers."  Orders: [facility] ([address]) [facility]- Est  Level 4 ([address])   Complete Medication List: 1)  Prenatal Vitamins 60-0.8 Mg Tabs (Prenatal multivit-min-fe-fa) .Marland Kitchen.. 1 tablet daily 2)  Lamotrigine 100 Mg Tabs (Lamotrigine) .... 2 tabs by mouth daily 3)  Ferrous Sulfate 324 Mg Tbec (Ferrous sulfate) .... One tablet daily for one week, then twice a day for anemia due to menstrual blood loss 4)  Citalopram Hydrobromide 40 Mg Tabs (Citalopram hydrobromide) .Marland Kitchen.. 1-1/2 tabs by mouth daily 5)  Lac-hydrin Five 5 % Lotn (Ammonium lactate) .... Apply to aa two times a day   Patient Instructions: 1)  I am very concerned about your lack of sleep.  As we discussed I strongly believe you need to see a psychiatrist.  Please call [facility] today and make an appointment:  [phone]. 2)  Please schedule a follow-up appointment in 2 weeks.   ] Laboratory Results   Urine Tests  Date/Time Received: July 16, 2008 10:09 AM  Date/Time Reported: July 16, 2008 10:31 AM     Urine HCG: negative Comments: ...........test performed by...........Marland KitchenTerese Door, CMA

## 2010-11-21 NOTE — Assessment & Plan Note (Signed)
Summary: F/U [facility]   Vital Signs:  Patient Profile:   40 Years Old Female Height:     68.5 inches Weight:      234.3 pounds BMI:     35.23 Temp:     97.6 degrees F Pulse rate:   80 / minute BP sitting:   127 / 82  (left arm)  Pt. in pain?   yes    Location:   all over body    Intensity:   8  Vitals Entered By: Dedra Skeens CMA, (June 27, 2007 9:44 AM)                  Chief Complaint:  f/u pain all over.  History of Present Illness: FU Anxiety - Patient has taken herself off of Abilify.  Gave patient Ativan.  It didn't help.  She has been dosing herself with Tylenol PM, taking several each night, no help.  She's been taking Trazodone at night, one tablet.   Current Allergies: No known allergies   Past Medical History:    Reviewed history from 12/19/2006 and no changes required:       648.44 Postpartum Depression, Chronic sinusitis -2005, G5P5005, High frequency bil sensorineural hearing loss -, History of Gestational DM - G4 (diet controlled), History of polyhydramnios - G4, History of preterm labor/shortened cervix -G4  Past Surgical History:    Reviewed history from 12/19/2006 and no changes required:       11-19-06 TC 205, TG 118, HDL 38, LDL 143 - 11/20/2006, Cautery of genital wart - age 40 - 11/22/2004   Family History:    Reviewed history from 12/19/2006 and no changes required:       Asthma, Diabetes 1st degree, HTN  Social History:    Reviewed history from 12/19/2006 and no changes required:       Lives with husband who is a Education officer, environmental at [employer], 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 Lungs are clear to auscultation, no crackles or wheezes. Heart:     Normal rate and regular rhythm. S1 and S2 normal without gallop, murmur, click, rub or other extra sounds. Neurologic:     alert & oriented X3, cranial nerves II-XII intact, strength normal in all extremities, gait normal, and DTRs symmetrical and normal.   Psych:     Oriented X3, memory intact for recent and remote, poor eye contact, labile affect, very anxious, easily distracted, hyperactive, agitated, and angry.  No active delusions or hallucinations.     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 Return to clinic Monday for evaluation. Consulted with Dr. Raymondo Band for recommendations, and followed up with Dr. Deirdre Priest per Dr. Macky Lower request.  Total time spent 45m.  Orders: [facility]- Est  Level 4 ([address])   Complete Medication List: 1)  Lexapro 20 Mg Tabs (Escitalopram oxalate) .Marland KitchenMarland KitchenMarland Kitchen  1 tablet daily 2)  Geodon 80 Mg Caps (Ziprasidone hcl) .Marland Kitchen.. 1 tablet twice daily   Patient Instructions: 1)  Please schedule a follow-up appointment in 3 days.    Prescriptions: GEODON 80 MG  CAPS (ZIPRASIDONE HCL) 1 tablet twice daily  #68 x 0   Entered and Authorized by:   Towana Badger MD   Signed by:   Towana Badger MD on 06/30/2007   Method used:   Samples Given   RxID:   [id number]

## 2010-11-21 NOTE — Miscellaneous (Signed)
Summary: questions from pharmacy  Clinical Lists Changes the pharmacist got the rx for morphine but needs to know if it is immediate or long lasting. their number is [phone]. paged Dr. Constance Goltz.Golden Circle RN  December 02, 2009 2:20 PM   Should be long acting (Ms Contine) Pearlean Brownie MD  December 02, 2009 2:37 PM  Notified the pharmacy of above. Dr. Constance Goltz called right after I had called the pharmacy & I told him what Dr. Deirdre Priest had ordered.Marland KitchenMarland KitchenGolden Circle RN  December 02, 2009 2:42 PM..

## 2010-11-21 NOTE — Assessment & Plan Note (Signed)
Summary: fu meds wp   Vital Signs:  Patient Profile:   40 Years Old Female Height:     68.5 inches Weight:      231 pounds Pulse rate:   80 / minute BP sitting:   116 / 76  Pt. in pain?   yes    Location:   body    Intensity:   7  Vitals Entered By: Arlyss Repress CMA, (June 18, 2007 8:31 AM)              Is Patient Diabetic? No   Chief Complaint:  1.) body pain x ? chronic and 2.) refill meds 3.) insomnia ongoing.  Depression History:      The patient presents with symptoms of depression which have been present for greater than two weeks.  The patient is having a depressed mood most of the day and has a diminished interest in her usual daily activities.  The patient denies significant weight loss, significant weight gain, insomnia, hypersomnia, psychomotor agitation, psychomotor retardation, fatigue (loss of energy), feelings of worthlessness (guilt), impaired concentration (indecisiveness), and recurrent thoughts of death or suicide.  Positive alarm features for a manic disorder include persistently & abnormally elevated mood, abnormally & persistently irritable mood, less need for sleep, talkative or feels need to keep talking, distractibility, flight of ideas, and psychomotor agitation.  She denies increase in goal-directed activity, inflated self-esteem or grandiosity, excessive buying sprees, excessive sexual indiscretions, and excessive foolish business investments.        The patient denies that she feels like life is not worth living, denies that she wishes that she were dead, and denies that she has thought about ending her life.  Her current symptoms almost always prevent her from doing her regular activities.        Comments:  Patient has run out of Lexapro, Trazodone, Ambien.  She is taking Abilify 20mg  daily.  Depression Treatment History:  Prior Medication Used:   Start Date: Assessment of Effect:   Comments:  Escitalopram     12/21/2006   some improvement     weight  gain Desyrel (trazodone)     01/21/2007   some improvement     --   Current Allergies: No known allergies   Past Medical History:    Reviewed history from 12/19/2006 and no changes required:       648.44 Postpartum Depression, Chronic sinusitis -2005, G5P5005, High frequency bil sensorineural hearing loss -, History of Gestational DM - G4 (diet controlled), History of polyhydramnios - G4, History of preterm labor/shortened cervix -G4  Past Surgical History:    Reviewed history from 12/19/2006 and no changes required:       11-19-06 TC 205, TG 118, HDL 38, LDL 143 - 11/20/2006, Cautery of genital wart - age 63 - 11/22/2004   Family History:    Reviewed history from 12/19/2006 and no changes required:       Asthma, Diabetes 1st degree, HTN  Social History:    Reviewed history from 12/19/2006 and no changes required:       Lives with husband who is a Education officer, environmental at [employer], has 4  children, housewife, former smoker (1 ppd x 20 yrs), quit smoking recently   Risk Factors:     Counseled to quit/cut down tobacco use:  yes   Review of Systems      See HPI   Physical Exam  General:     Well-developed,well-nourished,in manic, acute distress; alert,appropriate and cooperative throughout  examination Lungs:     Normal respiratory effort, chest expands symmetrically. Lungs are clear to auscultation, no crackles or wheezes. Heart:     Normal rate and regular rhythm. S1 and S2 normal without gallop, murmur, click, rub or other extra sounds. Neurologic:     alert & oriented X3, cranial nerves II-XII intact, strength normal in all extremities, gait normal, and DTRs symmetrical and normal.   Psych:     Oriented X3, memory intact for recent and remote, poor eye contact, labile affect, moderately anxious, easily distracted, hyperactive, agitated, and angry.  No active delusions or hallucinations.     Impression & Recommendations:  Problem # 1:  INSOMNIA, PERSISTENT (ICD-307.42) Patient  has been taking 2 tablets (20mg ) daily.  Will drop her back to 10mg  daily, as 10mg  is too activating.  She needs to see her psychiatrist for assistance.  I'm going to get her into our [facility].  [facility] is her psychotherapist.  Discussed with Dr. Raymondo Band, and will give some Ativan to help stabilize the acute mania she is having here today.   Patient out of Trazodone.  Will refill.  She is taking 100mg  for insomnia.    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 Drop down to 10mg . Will need to change to Geodon or Tegretol, will discuss with Dr. Kathrynn Running.    Complete Medication List: 1)  Trazodone Hcl 100 Mg Tabs (Trazodone hcl) .... Take a tablet at bedtime. 2)  Abilify 10 Mg Tabs (Aripiprazole) .Marland Kitchen.. 1 tablet daily 3)  Lexapro 20 Mg Tabs (Escitalopram oxalate) .Marland Kitchen.. 1 tablet daily 4)  Ativan 1 Mg Tabs (Lorazepam) .Marland Kitchen.. 1 tablet at bedtime  Depression Assessment/Plan: Treatment Comments:  Patient out of Lexapro, but she took it last night and is not having a serotonin withdrawal.  She is out of trazodone and ambien.  She took all of her meds last night.    Patient Instructions: 1)  Take 10mg  of Abilify each day, no more. 2)  Take 100mg  Trazodone each evening. 3)  Ativan 1mg  at bedtime for agitation and insomnia. 4)  Continue Lexapro 20mg  daily. 5)  Please schedule a follow-up appointment in 1 week. 6)  Make an appt with [facility]. 7)  Tobacco is very bad for your health and your loved ones! You Should stop smoking!. 8)  It is important that you exercise regularly at least 20 minutes 5 times a week. If you develop chest pain, have severe difficulty breathing, or feel very tired , stop exercising immediately and seek  medical attention. 9)  You need to lose weight. Consider a lower calorie diet and regular exercise.  10)  If you could be exposed to sexually transmitted diseases, you should use a condom.    Prescriptions: ATIVAN 1 MG  TABS (LORAZEPAM) 1 tablet at bedtime  #7 x 0   Entered and Authorized by:   Towana Badger MD   Signed by:   Towana Badger MD on 06/18/2007   Method used:   Print then Give to Patient   RxID:   [id number] LEXAPRO 20 MG  TABS (ESCITALOPRAM OXALATE) 1 tablet daily  #34 x 6   Entered and Authorized by:   Towana Badger MD   Signed by:   Towana Badger  MD on 06/18/2007   Method used:   Electronically sent to ...       [facility] [address]*       [address]       [place], [place]  [address]       Ph:        Fax:    RxID:   [id number] TRAZODONE HCL 100 MG  TABS (TRAZODONE HCL) Take a tablet at bedtime.  #34 x 0   Entered and Authorized by:   Towana Badger MD   Signed by:   Towana Badger MD on 06/18/2007   Method used:   Electronically sent to ...       [facility]       [address]       [place], [place]  [address]       Ph:        Fax:    RxID:   [id number] LEXAPRO 20 MG  TABS (ESCITALOPRAM OXALATE) 1 tablet daily  #34 x 6   Entered and Authorized by:   Towana Badger MD   Signed by:   Towana Badger MD on 06/18/2007   Method used:   Electronically sent to ...       [facility] [address]*       [address]       [place], [place]  [address]       Ph:        Fax:    RxID:   [id number] ABILIFY 10 MG  TABS (ARIPIPRAZOLE) 1 tablet daily  #34 x 6   Entered and Authorized by:   Towana Badger MD   Signed by:   Towana Badger MD on 06/18/2007   Method used:   Electronically sent to ...       [facility] [address]*       [address]       [place], [place]  [address]       Ph:        Fax:    RxID:   [id number]   Appended Document: Orders Update    Clinical Lists Changes  Orders: Added new Test order of [facility]- Est  Level 4 ([address]60) - Signed

## 2010-11-21 NOTE — Assessment & Plan Note (Signed)
Summary: knee pain,df   Vital Signs:  Patient profile:   40 year old female Height:      68.5 inches Weight:      276 pounds BMI:     41.50 Temp:     97.7 degrees F oral Pulse rate:   118 / minute BP standing:   114 / 79  (left arm) Cuff size:   large  Vitals Entered By: Tessie Fass CMA (October 12, 2009 4:31 PM) CC: bilateral knee pain Pain Assessment Patient in pain? yes     Location: knees Intensity: 10   Primary Care Provider:  Romero Belling MD  CC:  bilateral knee pain.  History of Present Illness: 40 year old female:  1. Knee pain: bilateral, but R > L, x 1 week after moving - had to go up and down many stairs, + PMHx of knee pain that required corticosteroid injection x 1 year ago - helped for several months. No imaging available. Patient with PMHx chronic pain.  Habits & Providers  Alcohol-Tobacco-Diet     Tobacco Status: current     Cigarette Packs/Day: 0.5  Current Medications (verified): 1)  Lamotrigine 200 Mg Tabs (Lamotrigine) .Marland Kitchen.. 1 Tab By Mouth Two Times A Day 2)  Citalopram Hydrobromide 40 Mg  Tabs (Citalopram Hydrobromide) .Marland Kitchen.. 1-1/2 Tabs By Mouth Daily 3)  Iron Supplement 325 (65 Fe) Mg Tabs (Ferrous Sulfate) .Marland Kitchen.. 1 Tab By Mouth Two Times A Day For Anemia 4)  Seroquel 200 Mg Tabs (Quetiapine Fumarate) .Marland Kitchen.. 1 Tab By Mouth Two Times A Day 5)  Clotrimazole 1 % Crea (Clotrimazole) .... Apply To Affected Skin Two Times A Day X2 Weeks.  Disp #1 Large Tube.  Allergies (verified): No Known Drug Allergies  Past History:  Past medical, surgical, family and social histories (including risk factors) reviewed for relevance to current acute and chronic problems.  Past Medical History: Reviewed history from 11/07/2007 and no changes required. Postpartum Depression  Chronic sinusitis -2005,  G6P5015, SAB 2008  High frequency bil sensorineural hearing loss  History of Gestational DM - G4 (diet controlled)  History of polyhydramnios - G4  History of  preterm labor/shortened cervix -G4  Past Surgical History: Reviewed history from 12/19/2006 and no changes required. 11-19-06 TC 205, TG 118, HDL 38, LDL 143 - 11/20/2006, Cautery of genital wart - age 53 - 11/22/2004  Family History: Reviewed history from 12/19/2006 and no changes required. Asthma, Diabetes 1st degree, HTN  Social History: Reviewed history from 07/16/2008 and no changes required. 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  Disabled from bipolar disorder.  Review of Systems General:  Denies chills and fever. MS:  Complains of joint pain; denies joint redness and joint swelling. Derm:  Denies changes in color of skin.  Physical Exam  General:  Obese, in no acute distress; alert,appropriate and cooperative throughout examination. Vitals reviewed. Msk:  Valgus with standing. Knees Inspection: no obvious deformities, no skin changes. Palpation: no ttp along joint line. slight ttp along patella insertion sites bilaterally. Testing: + crepitus bilaterally. Slightly limited extension 2/2 pain bilaterally. Neg ant/post drawer, varus/valgus. Weak VMO and hip abductors bilaterally. Neurologic:  strength normal in all extremities, sensation intact to light touch, gait normal, and DTRs symmetrical and normal.   Psych:  good eye contact, not anxious appearing, and not depressed appearing.     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 40 mg kenalog + 4 mL lidocaine was injected into joint space using medial approach while patient in supine position. She was given RED FLAGS, aftercare, and follow-up instructions. Note: she was not interested in PT home exercised at this time. Orders: [facility]- Est Level  3  ([address]191) Injection, intermediate joint - [facility] (47829)  Complete Medication List: 1)  Lamotrigine 200 Mg Tabs (Lamotrigine) .Marland Kitchen.. 1 tab by mouth two times a day 2)  Citalopram Hydrobromide 40 Mg Tabs (Citalopram hydrobromide) .Marland Kitchen.. 1-1/2 tabs by mouth daily 3)  Iron Supplement 325 (65 Fe) Mg Tabs (Ferrous sulfate) .Marland Kitchen.. 1 tab by mouth two times a day for anemia 4)  Seroquel 200 Mg Tabs (Quetiapine fumarate) .Marland Kitchen.. 1 tab by mouth two times a day 5)  Clotrimazole 1 % Crea (Clotrimazole) .... Apply to affected skin two times a day x2 weeks.  disp #1 large tube.  Patient Instructions: 1)  Follow up in 4-6 weeks if you do not feel better.  Prevention & Chronic Care Immunizations   Influenza vaccine: Not documented    Tetanus booster: 09/29/2008: Tdap    Pneumococcal vaccine: Not documented  Other Screening   Pap smear: NEGATIVE FOR INTRAEPITHELIAL LESIONS OR MALIGNANCY.  (09/29/2008)   Pap smear due: 08/2008   Smoking status: current  (10/12/2009)   Smoking cessation counseling: yes  (06/07/2008)  Lipids   Total Cholesterol: 193  (02/25/2007)   LDL: 126  (02/25/2007)   LDL Direct: 140  (11/13/2007)   HDL: 43  (02/25/2007)   Triglycerides: 120  (02/25/2007)

## 2010-11-21 NOTE — Progress Notes (Signed)
Summary: Refill  Phone Note Call from Patient Call back at [facility] Phone [phone]   Summary of Call: Pt is needing a refill on lamical, she has no refills left.  She states she has 2 pills left, one tonight and one tomorrow night.  Pt uses   [facility] [address] Initial call taken by: Haydee Salter,  January 14, 2008 3:07 PM  Follow-up for Phone Call        will forward message to MD. Follow-up by: Theresia Lo RN,  January 14, 2008 3:09 PM      Prescriptions: LAMICTAL 100 MG  TABS (LAMOTRIGINE) One tablet daily for bipolar depression.   Please make an appointment.  #30 x 4   Entered and Authorized by:   Towana Badger MD   Signed by:   Towana Badger MD on 01/15/2008   Method used:   Electronically sent to ...       [address]*       [address]       [place], [place]  [address]       Ph: [phone]       Fax: [phone]   RxID:   [id number]

## 2010-11-21 NOTE — Miscellaneous (Signed)
Summary: ROI: DLG  ROI: DLG   Imported By: Knox Royalty 05/27/2008 14:41:04  _____________________________________________________________________  External Attachment:    Type:   Image     Comment:   External Document

## 2010-11-21 NOTE — Miscellaneous (Signed)
  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

## 2010-11-21 NOTE — Assessment & Plan Note (Signed)
Summary: f/u visit/bmc   Vital Signs:  Patient Profile:   40 Years Old Female Height:     68.5 inches Weight:      254.3 pounds Temp:     97.6 degrees F Pulse rate:   98 / minute BP sitting:   154 / 85  (left arm)  Pt. in pain?   no  Vitals Entered By: Alphia Kava (April 02, 2008 10:17 AM)              Is Patient Diabetic? No     Chief Complaint:  f/u.  History of Present Illness: HPI New Complaint (loc, qual, sev, dur, ROS):  Left lateral knee pain.  One month.  Diffuse tenderness of lateral right knee.  No trauma.  No swelling, locking or popping.  No fever or redness.  Patient is ambulatory.  She notes radiation of the pain up the lateral thigh and around the back of the knee. Past history/Family History of this problem:  Morbid obesity.  Recurrent varied knee and lower leg pain in the past.  No trauma, surgery or radiographically documented pathology of the knee joints.     Current Allergies: No known allergies     Risk Factors:     Counseled to quit/cut down tobacco use:  yes     Knee Exam  General:    Well-developed, well-nourished, normal body habitus; no deformities, normal grooming.  Gait:    Normal heel-toe gait pattern bilaterally.    Skin:    Intact, no scars, lesions, rashes, cafe au lait spots, or bruising.    Inspection:     No deformity, ecchymosis or swelling.   Vascular:    There was no swelling or varicose veins. The pulses and temperature are normal. There was no edema or tenderness.  Knee Exam:    Right:    Inspection:  Normal    Palpation:  Normal    Stability:  stable    Tenderness:  no    Swelling:  no    Erythema:  no    Range of Motion:       Flexion-Active: full       Extension-Active: full       Flexion-Passive: full       Extension-Passive: full    Left:    Inspection:  Normal    Palpation:  Normal    Stability:  stable    Tenderness:  left femoral condyle    Swelling:  no    Erythema:  no    Range of Motion:        Flexion-Active: full       Extension-Active: full       Flexion-Passive: full       Extension-Passive: full  Anterior drawer:    Right negative; Left negative Posterior drawer:    Right negative; Left negative MCL:    Right negative; Left negative LCL:    Right negative; Left negative  Knee joint entirely stable.      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  No complaints.  Recommended follow-up in two weeks. Patient has requested recommendation for new physician outside of this practice, located close to them. Recommended Gilmore Laroche at [facility]. Orders: [facility]- Est Level  3 ([address]409) Injection, large joint- [facility] ([address]10)   Complete Medication List: 1)  Prenatal Vitamins 60-0.8 Mg Tabs (Prenatal multivit-min-fe-fa) .Marland Kitchen.. 1 tablet daily 2)  Lamictal 100 Mg Tabs (Lamotrigine) .... One tablet  daily for bipolar depression.   please make an appointment. 3)  Ferrous Sulfate 324 Mg Tbec (Ferrous sulfate) .... One tablet daily for one week, then twice a day for anemia due to menstrual blood loss 4)  Celexa 40 Mg Tabs (Citalopram hydrobromide) .... One tablet daily for depression 5)  Diflucan 200 Mg Tabs (Fluconazole) .... One tablet for yeast.   Patient Instructions: 1)  Please schedule a follow-up appointment in 2 weeks.   ]   Procedure Note  Injections: The patient complains of pain and tenderness but denies redness, inflammation, and swelling. Duration of symptoms: 1 month Indication: chronic pain  Procedure # 1: tendon sheath injection    Region: lateral    Location: iliotibial band    Technique: 24 g needle    Medication: 40 mg depomedrol    Anesthesia: 2.0 ml 0.5% marcaine, 2.0 ml 1% lidocaine w/o epinephrine  Cleaned and prepped with: betadine

## 2010-11-21 NOTE — Assessment & Plan Note (Signed)
Summary: depression wp   Vital Signs:  Patient Profile:   40 Years Old Female Height:     68.5 inches Weight:      240.7 pounds BMI:     36.20 Temp:     98.0 degrees F oral Pulse rate:   83 / minute BP sitting:   116 / 84  (right arm) Cuff size:   regular  Pt. in pain?   no  Vitals Entered By: Dedra Skeens CMA, (August 06, 2008 3:10 PM)                  PCP:  Romero Belling MD  Chief Complaint:  Depression.  History of Present Illness: History of present illness is detailed by problem in assessment and plan.    Updated Prior Medication List: PRENATAL VITAMINS 60-0.8 MG  TABS (PRENATAL MULTIVIT-MIN-FE-FA) 1 tablet daily LAMOTRIGINE 100 MG  TABS (LAMOTRIGINE) 2 tabs by mouth daily FERROUS SULFATE 324 MG  TBEC (FERROUS SULFATE) One tablet daily for one week, then twice a day for anemia due to menstrual blood loss CITALOPRAM HYDROBROMIDE 40 MG  TABS (CITALOPRAM HYDROBROMIDE) 1-1/2 tabs by mouth daily LAC-HYDRIN FIVE 5 % LOTN (AMMONIUM LACTATE) apply to aa two times a day ZOLPIDEM TARTRATE 10 MG TABS (ZOLPIDEM TARTRATE) 1/2 - 1 tab by mouth at bedtime as needed insomnia  Current Allergies (reviewed today): No known allergies   Past Medical History:    Reviewed history from 11/07/2007 and no changes required:       Postpartum Depression        Chronic sinusitis -2005,        [id number], SAB 2008        High frequency bil sensorineural hearing loss        History of Gestational DM - G4 (diet controlled)        History of polyhydramnios - G4        History of preterm labor/shortened cervix -G4   Family History:    Reviewed history from 12/19/2006 and no changes required:       Asthma, Diabetes 1st degree, HTN  Social History:    Reviewed history from 07/16/2008 and no changes required:       Lives with husband who is a Education officer, environmental at [employer], has 5  children, housewife, former smoker (1 ppd x 20 yrs), quit smoking recently.  Disabled from bipolar disorder.      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  Persistent insomnia with 2-3 hours of sleep per night.  Just wants to sleep she says.  Ambien does not help.  In past she has taken Rozerem, 2x Advil PM, and Flexeril without being able to sleep.  Denies SI/HI.  Has not called the psychiatrist that I gave her a number for.  She and husband have been adamantly opposed to psychiatric hospitalization, though I believe she needs this.  Feels her moods may be somewhat better since I increased her medications.  Denies HI/SI.  A/P:  She has uncontrolled bipolar disorder, and I have titrated up her medications as far as I feel comfortable.  Have urged her several times to  see a psychiatrist and have given her the contact number for 2 psychiatrists several times.  She also refuses inpatient treatment.  She thinks she is well cared for by her husband, but he seems to have no insight into the severity of the problem here.  Will urge her once more to contact psychiatrist. Orders: [facility]- Est Level  3 (52841)  Complete Medication List: 1)  Prenatal Vitamins 60-0.8 Mg Tabs (Prenatal multivit-min-fe-fa) .Marland Kitchen.. 1 tablet daily 2)  Lamotrigine 100 Mg Tabs (Lamotrigine) .... 2 tabs by mouth daily 3)  Ferrous Sulfate 324 Mg Tbec (Ferrous sulfate) .... One tablet daily for one week, then twice a day for anemia due to menstrual blood loss 4)  Citalopram Hydrobromide 40 Mg Tabs (Citalopram hydrobromide) .Marland Kitchen.. 1-1/2 tabs by mouth daily 5)  Lac-hydrin Five 5 % Lotn (Ammonium lactate) .... Apply to aa two times a day 6)  Zolpidem Tartrate 10 Mg Tabs (Zolpidem tartrate) .... 1/2 - 1 tab by  mouth at bedtime as needed insomnia   Patient Instructions: 1)  As I indicated earlier, I believe you have uncontrolled bipolar disorder and need to see a psychiatrist immediately.  You need to call the number I gave you earlier:   [facility]:  [phone].   ]

## 2010-11-21 NOTE — Miscellaneous (Signed)
Summary: vag bleeding  Clinical Lists Changes    pt c/o moderate vaginal bleeding. She is pregnant. [facility] she agreed with plan

## 2010-11-21 NOTE — Letter (Signed)
Summary: Handout Printed  Printed Handout:  - Family Medicine Patient Instructions 

## 2010-11-21 NOTE — Progress Notes (Signed)
Summary: Rx Prob  Phone Note Call from Patient Call back at Work Phone [phone]   Caller: Patient Summary of Call: Pt having trouble with getting rx filled at pharmacy now.  [facility] [employer] [phone].  Can we also cll pt when we get it straight.  Initial call taken by: Clydell Hakim,  December 02, 2009 2:43 PM  Follow-up for Phone Call        called patient and she has received medication from pharmacy Follow-up by: Theresia Lo RN,  December 02, 2009 3:34 PM

## 2010-11-21 NOTE — Progress Notes (Signed)
Summary: Referral  Phone Note Call from Patient Call back at [phone]   Summary of Call: needs to speak w someone re: referral to psychiatrist Initial call taken by: Haydee Salter,  July 28, 2008 9:22 AM  Follow-up for Phone Call        Patient called and left me a voice mail.  I looked back through Dr. Lonell Face notes and saw that patient was instructed to call [facility].  I gave her the number again and asked her to call.  She seems to think that she is not able to make these kinds of calls (due to her disability) and was expecting someone to call her.  I told her she would need to make the initial call.  I am not sure she will be able to see a psychiatrist at [facility] on an outpatient basis with Medicaid insurance.  She may need to go to [facility].  Will forward note to Dr. Constance Goltz for his review.  Given the circumstances, he may wish to follow-up with a phone call if the patient does not have a follow-up scheduled soon. Follow-up by: Spero Geralds PsyD,  July 28, 2008 4:38 PM      Appended Document: Referral Spoke with patient by phone today, and gave her the number for the [facility]:  [phone]  Advised that she make an appointment with them within the week.  Patient verbalizes understanding.  She is also unable to sleep, requesting sleep medicine.  Will call in Rx for Ambien for short term relief.

## 2010-11-21 NOTE — Assessment & Plan Note (Signed)
Summary: Depression   Vital Signs:  Patient Profile:   40 Years Old Female Height:     68.5 inches Weight:      236 pounds Temp:     98 degrees F Pulse rate:   121 / minute BP sitting:   122 / 87  (left arm)  Pt. in pain?   yes    Location:   all over    Intensity:   9    Type:       aching  Vitals Entered By: Theresia Lo RN (September 29, 2008 8:47 AM)              Is Patient Diabetic? No      PCP:  Romero Belling MD  Chief Complaint:  generalized aching all over, wants pain med, and pap.  History of Present Illness: History of present illness is detailed by problem in assessment and plan.    Updated Prior Medication List: PRENATAL VITAMINS 60-0.8 MG  TABS (PRENATAL MULTIVIT-MIN-FE-FA) 1 tablet daily LAMOTRIGINE 100 MG  TABS (LAMOTRIGINE) 2 tabs by mouth daily FERROUS SULFATE 324 MG  TBEC (FERROUS SULFATE) One tablet daily for one week, then twice a day for anemia due to menstrual blood loss CITALOPRAM HYDROBROMIDE 40 MG  TABS (CITALOPRAM HYDROBROMIDE) 1-1/2 tabs by mouth daily LAC-HYDRIN FIVE 5 % LOTN (AMMONIUM LACTATE) apply to aa two times a day AMBIEN CR 12.5 MG CR-TABS (ZOLPIDEM TARTRATE) one by mouth at night  Current Allergies (reviewed today): No known allergies       Physical Exam  General:     Alert and oriented x3, obese, tearful Lungs:     Clear to auscultation bilateral with normal work of breathing. Heart:     Tachycardic, regular rhythm without murmurs, rubs, or gallops.  Normal precordium. Abdomen:     Soft, nontender, normoactive bowel sounds with no masses or hepatosplenomegaly. Genitalia:     Pelvic Exam:        External: normal female genitalia without lesions or masses        Vagina: normal without lesions or masses        Cervix: tender, normal without lesions or masses        Adnexa: normal bimanual exam without masses or fullness        Uterus: normal by palpation        Pap smear: performed Extremities:     No edema or  tenderness in bilateral lower extremities. 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  Poor sleep--approximately 4 hours per night (see insomnia separate), decreased energy, crying daily.  Sad about mother's death (suicide by overdose earlier this year).  Has not called the psychiatrist that I gave her a number for--several times.  She and husband have been adamantly opposed to psychiatric hospitalization, though I believe she needs this.  Denies HI/SI.  A/P:  She has uncontrolled bipolar disorder (currently in a depressive phase), and I have titrated up her medications as far as I feel comfortable.  Have urged her several times to see a psychiatrist and have given her the contact number for 2 psychiatrists several times.  She again refuses inpatient treatment today, though I urged her to allow admission to psychiatric hospital.  She thinks she is well cared for  by her husband, but he is currently having back pain and needs to be cared for by her and in general seems to have no insight into the severity of the problem here.  Will urge her once more to contact psychiatrist.  Denies SI/HI. Orders: [facility]- Est  Level 4 ([address]109)   Problem # 2:  INSOMNIA, PERSISTENT (ICD-307.42) Assessment: Deteriorated Is requiring massive amounts of energy to try to get to sleep--none of which are working.  Currently she is taking:  3 Flexeril, 1 Advil PM, 2 Excedrin PM, 2 Tylenol PM, Lamotrigine and Citalopram.  Cannot fall asleep with these medications.  She slept from 3 am - 7 am last night--this is typical for her.  A/P:  Believe this is secondary to Bipolar Disorder.  Still believe she urgently needs a psychiatric hospitalization.  Problem # 3:  BLADDER PROLAPSE (ICD-596.9) Assessment: New Notices a  "balloon filled with water" that protrudes from vagina when she urinates.  Voiding is extremely slow ( ~30 minutes).  Nonpainful.  A/P:  She is a multiparous woman and is describing bladder prolapse, though none is seen on pelvic exam today. 1) Refer to [facility]necology clinic at [facility] for further evaluation. Orders: [facility]- Est  Level 4 (60454) Gynecologic Referral (Gyn)   Problem # 4:  Preventive Health Care (ICD-V70.0) Pap, Tdap today.  Does not meet H1N1 vaccine indications.  Would like to give Seasonal Influenza vaccine, but none are available today.  Problem # 5:  LOW BACK PAIN, CHRONIC (ICD-724.2) Assessment: New Is describing pain all over today.  With some questioning can localize it to her lower back, though history is difficult.  Speech is pressured and she is wandering in her speech.  A/P:  Difficult to assess fully.  Feel her primary issue is an urgent need for psychiatric intervention, after which this can be evaluated better. Orders: [facility]- Est  Level 4 (99214)   Problem # 6:  UNSPEC SYMPTOM ASSOC W/FEMALE GENITAL ORGANS (ICD-625.9) Assessment: New Vaginal/cervical pain on exam today.  Will check GC/CT. Orders: GC/Chlamydia-[facility] ([id number]/[id number])   Complete Medication List: 1)  Prenatal Vitamins 60-0.8 Mg Tabs (Prenatal multivit-min-fe-fa) .Marland Kitchen.. 1 tablet daily 2)  Lamotrigine 100 Mg Tabs (Lamotrigine) .... 2 tabs by mouth daily 3)  Ferrous Sulfate 324 Mg Tbec (Ferrous sulfate) .... One tablet daily for one week, then twice a day for anemia due to menstrual blood loss 4)  Citalopram Hydrobromide 40 Mg Tabs (Citalopram hydrobromide) .Marland Kitchen.. 1-1/2 tabs by mouth daily 5)  Lac-hydrin Five 5 % Lotn (Ammonium lactate) .... Apply to aa two times a day 6)  Ambien Cr 12.5 Mg Cr-tabs (Zolpidem tartrate) .... One by mouth at night  Other Orders: Pap Smear-[facility] ([id number]) Tdap => 78yrs IM (29562) Admin 1st Vaccine (13086)   Patient Instructions: 1)  We will arrange a gynecology consult for  your bladder problems and will call you with the appointment. 2)  As I indicated earlier, I believe you have uncontrolled bipolar disorder and need to see a psychiatrist immediately.  You need to call the number I gave you earlier:   [facility]:  [phone]. 3)  Please schedule a follow-up appointment in 3 months.   ]  Tetanus/Td Vaccine    Vaccine Type: Tdap    Site: left deltoid    Mfr: GlaxoSmithKline    Dose: 0.5 ml    Route: IM    Given by: Theresia Lo RN    Exp. Date: 10/07/2010    Lot #: [id number]  VIS given: 09/09/07 version given September 29, 2008.

## 2010-11-21 NOTE — Progress Notes (Signed)
Summary: phn msg  Phone Note Call from Patient   Caller: Patient Summary of Call: Pt had to cancel today due to her husband having a seizure. Initial call taken by: Clydell Hakim,  Feb 23, 2010 9:08 AM

## 2010-11-22 ENCOUNTER — Emergency Department (HOSPITAL_COMMUNITY): Payer: MEDICARE

## 2010-11-22 ENCOUNTER — Inpatient Hospital Stay (HOSPITAL_COMMUNITY)
Admission: EM | Admit: 2010-11-22 | Discharge: 2010-11-24 | DRG: 603 | Disposition: A | Discharge status: Home / Self Care | Payer: MEDICARE | Attending: Emergency Medicine | Admitting: Emergency Medicine

## 2010-11-22 ENCOUNTER — Encounter: Payer: Self-pay | Admitting: Family Medicine

## 2010-11-22 DIAGNOSIS — J069 Acute upper respiratory infection, unspecified: Secondary | ICD-10-CM | POA: Diagnosis present

## 2010-11-22 DIAGNOSIS — L02419 Cutaneous abscess of limb, unspecified: Principal | ICD-10-CM | POA: Diagnosis present

## 2010-11-22 DIAGNOSIS — J159 Unspecified bacterial pneumonia: Secondary | ICD-10-CM

## 2010-11-22 DIAGNOSIS — F319 Bipolar disorder, unspecified: Secondary | ICD-10-CM | POA: Diagnosis present

## 2010-11-22 DIAGNOSIS — I8000 Phlebitis and thrombophlebitis of superficial vessels of unspecified lower extremity: Secondary | ICD-10-CM

## 2010-11-22 DIAGNOSIS — F17200 Nicotine dependence, unspecified, uncomplicated: Secondary | ICD-10-CM | POA: Diagnosis present

## 2010-11-22 DIAGNOSIS — M79609 Pain in unspecified limb: Secondary | ICD-10-CM

## 2010-11-22 LAB — CBC
HCT: 41.7 % (ref 36.0–46.0)
Hemoglobin: 13.8 g/dL (ref 12.0–15.0)
MCH: 25.8 pg — ABNORMAL LOW (ref 26.0–34.0)
MCHC: 33.1 g/dL (ref 30.0–36.0)
MCV: 77.9 fL — ABNORMAL LOW (ref 78.0–100.0)
Platelets: 204 10*3/uL (ref 150–400)
RBC: 5.35 MIL/uL — ABNORMAL HIGH (ref 3.87–5.11)
RDW: 14.4 % (ref 11.5–15.5)
WBC: 13.5 10*3/uL — ABNORMAL HIGH (ref 4.0–10.5)

## 2010-11-22 LAB — DIFFERENTIAL
Basophils Absolute: 0 10*3/uL (ref 0.0–0.1)
Basophils Relative: 0 % (ref 0–1)
Eosinophils Absolute: 0 10*3/uL (ref 0.0–0.7)
Eosinophils Relative: 0 % (ref 0–5)
Lymphocytes Relative: 23 % (ref 12–46)
Lymphs Abs: 3.1 10*3/uL (ref 0.7–4.0)
Monocytes Absolute: 1.3 10*3/uL — ABNORMAL HIGH (ref 0.1–1.0)
Monocytes Relative: 9 % (ref 3–12)
Neutro Abs: 9.1 10*3/uL — ABNORMAL HIGH (ref 1.7–7.7)
Neutrophils Relative %: 68 % (ref 43–77)

## 2010-11-22 LAB — BASIC METABOLIC PANEL WITH GFR
BUN: 4 mg/dL — ABNORMAL LOW (ref 6–23)
CO2: 21 meq/L (ref 19–32)
Calcium: 8.9 mg/dL (ref 8.4–10.5)
Chloride: 100 meq/L (ref 96–112)
Creatinine, Ser: 1.15 mg/dL (ref 0.4–1.2)
GFR calc non Af Amer: 53 mL/min — ABNORMAL LOW
Glucose, Bld: 91 mg/dL (ref 70–99)
Potassium: 3.5 meq/L (ref 3.5–5.1)
Sodium: 134 meq/L — ABNORMAL LOW (ref 135–145)

## 2010-11-22 LAB — URINALYSIS, ROUTINE W REFLEX MICROSCOPIC
Bilirubin Urine: NEGATIVE
Hgb urine dipstick: NEGATIVE
Ketones, ur: NEGATIVE mg/dL
Nitrite: NEGATIVE
Protein, ur: NEGATIVE mg/dL
Specific Gravity, Urine: 1.006 (ref 1.005–1.030)
Urine Glucose, Fasting: NEGATIVE mg/dL
Urobilinogen, UA: 1 mg/dL (ref 0.0–1.0)
pH: 6 (ref 5.0–8.0)

## 2010-11-22 LAB — POCT PREGNANCY, URINE: Preg Test, Ur: NEGATIVE

## 2010-11-23 ENCOUNTER — Inpatient Hospital Stay (HOSPITAL_COMMUNITY): Payer: MEDICARE

## 2010-11-23 LAB — BASIC METABOLIC PANEL
BUN: 4 mg/dL — ABNORMAL LOW (ref 6–23)
CO2: 24 mEq/L (ref 19–32)
Calcium: 8.3 mg/dL — ABNORMAL LOW (ref 8.4–10.5)
Chloride: 105 mEq/L (ref 96–112)
Creatinine, Ser: 0.93 mg/dL (ref 0.4–1.2)
GFR calc Af Amer: >60 mL/min (ref >60)
GFR calc non Af Amer: >60 mL/min (ref >60)
Glucose, Bld: 82 mg/dL (ref 70–99)
Potassium: 3.6 mEq/L (ref 3.5–5.1)
Sodium: 138 mEq/L (ref 135–145)

## 2010-11-23 LAB — INFLUENZA PANEL BY PCR (TYPE A & B)
H1N1 flu by pcr: NOT DETECTED
Influenza A By PCR: NEGATIVE
Influenza B By PCR: NEGATIVE

## 2010-11-23 LAB — CARDIAC PANEL(CRET KIN+CKTOT+MB+TROPI)
CK, MB: 0.9 ng/mL (ref 0.3–4.0)
Relative Index: INVALID (ref 0.0–2.5)
Total CK: 85 U/L (ref 7–177)
Troponin I: 0.01 ng/mL (ref 0.00–0.06)

## 2010-11-23 LAB — CBC
HCT: 36.7 % (ref 36.0–46.0)
Hemoglobin: 12 g/dL (ref 12.0–15.0)
MCH: 25.8 pg — ABNORMAL LOW (ref 26.0–34.0)
MCHC: 32.7 g/dL (ref 30.0–36.0)
MCV: 78.8 fL (ref 78.0–100.0)
Platelets: 172 10*3/uL (ref 150–400)
RBC: 4.66 MIL/uL (ref 3.87–5.11)
RDW: 14.8 % (ref 11.5–15.5)
WBC: 12.9 10*3/uL — ABNORMAL HIGH (ref 4.0–10.5)

## 2010-11-23 LAB — EXPECTORATED SPUTUM ASSESSMENT W REFEX TO RESP CULTURE

## 2010-11-23 NOTE — Progress Notes (Signed)
  Phone Note Call from Patient   Summary of Call: states that [employer] is now $90 and cannot afford it.  needs to know if there is something else that can substitute cheaper. [employer] [address] Initial call taken by: De Nurse,  November 17, 2010 2:40 PM  Follow-up for Phone Call        Pt will need follow up appointment to discuss Follow-up by: Angelena Sole MD,  November 17, 2010 4:49 PM  Additional Follow-up for Phone Call Additional follow up Details #1::        Pt informed of the above.  She will call to schedule Additional Follow-up by: Jone Baseman CMA,  November 17, 2010 4:52 PM

## 2010-11-25 LAB — CULTURE, RESPIRATORY

## 2010-11-25 LAB — CULTURE, RESPIRATORY W GRAM STAIN: Culture: NORMAL

## 2010-11-28 LAB — CULTURE, BLOOD (ROUTINE X 2)
Culture  Setup Time: 201202011447
Culture  Setup Time: 201202011447
Culture: NO GROWTH
Culture: NO GROWTH

## 2010-11-29 NOTE — Assessment & Plan Note (Signed)
Summary: Discuss Seroquel    Vital Signs:  Patient profile:   40 year old female Weight:      282.6 pounds O2 Sat:      98 % on Room air Temp:     98.8 degrees F oral Pulse rate:   110 / minute Resp:     16 per minute BP sitting:   113 / 77  (left arm)  Vitals Entered By: Arlyss Repress, CNA  O2 Flow:  Room air  Primary Care Provider:  Angelena Sole MD  CC:  Seroquel cost.  History of Present Illness:    Pt here because cost of serquel has changed, she has been on this medication for many years. Recently the cost went from $3.50 to $ 95 dollars and they are unsure why. She continues to take her Lamictal and Celexa. Per the notes she has been on multiple medications for her Bipolar disease Previously managed at [facility] last visit > 1 year ago Pt now has Medicare and has a directory of providers who she can be treated by but has not  called for appt yet (Husband is her POA)  She continues to have both auditory and visual hallucinations which are unchanged She is claustrophobic "Her nerves are bad"  Current Medications (verified): 1)  Lamotrigine 200 Mg Tabs (Lamotrigine) .Marland Kitchen.. 1 Tab By Mouth Two Times A Day 2)  Citalopram Hydrobromide 40 Mg  Tabs (Citalopram Hydrobromide) .Marland Kitchen.. 1-1/2 Tabs By Mouth Daily 3)  Iron Supplement 325 (65 Fe) Mg Tabs (Ferrous Sulfate) .Marland Kitchen.. 1 Tab By Mouth Two Times A Day For Anemia 4)  Seroquel 200 Mg Tabs (Quetiapine Fumarate) .Marland Kitchen.. 1 Tab By Mouth Two Times A Day 5)  Prevident 5000 Sensitive 1.1-5 % Pste (Sod Fluoride-Potassium Nitrate) .... Use As Toothpaste.  Allergies (verified): 1)  ! 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 Since it is a new year with her Medicare she has a deductible she needs to pay, this is $92, husband to get medication today. He is also to call Medcare and make sure the cost will return to previous price. Reiterated again calling the providers given that will cover her for Psychiatric visits, they are also on the waiting list at mental health. No change to meds today  Orders: [facility]- Est Level  3 (04540)  Complete Medication List: 1)  Lamotrigine 200 Mg Tabs (Lamotrigine) .Marland Kitchen.. 1 tab by mouth two times a day 2)  Citalopram Hydrobromide 40 Mg Tabs (Citalopram hydrobromide) .Marland Kitchen.. 1-1/2 tabs by mouth daily 3)  Iron Supplement 325 (65 Fe) Mg Tabs (Ferrous sulfate) .Marland Kitchen.. 1 tab by mouth two times a day for anemia 4)  Seroquel 200 Mg Tabs (Quetiapine fumarate) .Marland Kitchen.. 1 tab by mouth two times a day 5)  Prevident 5000 Sensitive 1.1-5 % Pste (Sod fluoride-potassium nitrate) .... Use as toothpaste.  Patient Instructions: 1)  Call the medicare D people and ask about her deductible 2)  Call the [facility] to discuss getting your wife on your insurance 3)  The Seroquel this month is $92, this is your deductible 4)  Call the list of providers you have and get her an appt  Prescriptions: PREVIDENT 5000  SENSITIVE 1.1-5 % PSTE (SOD FLUORIDE-POTASSIUM NITRATE) Use as toothpaste.  #1 x 3   Entered and Authorized by:   Milinda Antis MD   Signed by:   Milinda Antis MD on 11/20/2010   Method used:   Electronically to        [facility] [address] (retail)       [address]       [place], [place]  [address]       Ph: [phone]       Fax: [phone]   RxID:   [id number]    Orders Added: 1)  [facility]- Est Level  3 [[id number]]

## 2010-11-29 NOTE — Assessment & Plan Note (Signed)
Summary: Hospital Admission H&P   Primary Provider:  Angelena Sole MD  CC:  HA and cough and fever x 2 days.  History of Present Illness: Pt complains of HA, cough, fever and chills that all started acutely on Monday evening.  She was seen in clinic Monday afternoon for an unrelated matter and felt fine at that time. She became acutely ill on Monday evening. All symptoms started at the same time. She also noticed a swelling on her R thigh that spread down the anterior aspect of her R thigh and leg on Monday that is associated wtih pain and redness.  Regarding her flu-like illlness, along with the above, she endorses, productive cough that has small amount of blood, CP when she coughs, congestion. She denies sick contacts  Regarding her R leg swelling, redness and pain,  she denies trauma and bug bites to her R leg.   Code: Full  ED course: She recieved Rocephin, Azithromycin and Vancomycin in ED. Also Tylenol. 1 L NS bolus.   Husband: Mr. Sharmon Cheramie [phone].   Allergies: 1)  ! Hydrocodone  Past History:  Past Medical History: Last updated: 11/07/2007 Postpartum Depression  Chronic sinusitis -2005,  G6P5015, SAB 2008  High frequency bil sensorineural hearing loss  History of Gestational DM - G4 (diet controlled)  History of polyhydramnios - G4  History of preterm labor/shortened cervix -G4  Family History: Last updated: 12/19/2006 Asthma, Diabetes 1st degree, HTN  Social History: Last updated: 11/22/2010 Lives with husband who is a Education officer, environmental at [employer], has 5  children, housewife, smokes 5 cig/day down from 2ppd.  Disabled from bipolar disorder. Has a cat at home.   Social History: Lives with husband who is a Education officer, environmental at [employer], has 5  children, housewife, smokes 5 cig/day down from 2ppd.  Disabled from bipolar disorder. Has a cat at home.   Physical Exam  General:  Vitals reviewed: T 101.4, 121, 20, 114/75, 96-100% on RA.  Mild distressed.  Head:   normocephalic and atraumatic.   Eyes:  vision grossly intact.   Mouth:  MMM Poor dentition pharynx pink and moist.   Neck:  no masses.  No lymphadenopathy.  Lungs:  slight increased WOB. Rhoncherous b/l Exp wheezing-mild.   Heart:  Normal rate and regular rhythm. S1 and S2 normal without gallop, murmur, click, rub or other extra sounds. Abdomen:  soft, non-tender, and normal bowel sounds, obese  Pulses:  R and L radial and dorsalis pedis and posterior tibial pulses are full and equal bilaterally Extremities:  RLE redness, eythema, mild edema, in a continous distibution along anterior thigh to anterior leg.  Neurologic:  strength normal in all extremities, sensation intact to light touch,  Skin:  Warm to touch.  Psych:  no apparent hallunications Slightly agitated good eye contact  Additional Exam:  CBC: 13.5>13.8/41.7<204 N: 68% BMET: 134/3.5/100/21/4/1.15<91 U preg: neg UA: neg Doppler LE: negative CXR: Slightly increased markings at right lung base on PA view, question   minimal atelectasis or or less likely infiltrate.   Impression & Recommendations:  Problem # 1:  Influenza vs. CAP Symptoms and PE/imaging most consistent with influenza. However, bacterial pneumonia possible.  Will continue ED Abx: rocephin, azithromycin, vanc P blood cultures. Will also obtain flu PCR Admit to Floor with ILI precautions.  repeat CXR in AM  Problem # 2:  R LE edema Cellulitus vs. Superficial thrombophlebitis. DVT r/o with neg dopplers.  Most likely the later given distribution.  Will cont  vanc and f/u blood cultures. also ibuprofen if thrombophlebitis.   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   Problem # 4:  FEn/GI Heart healty diet. NS @ 100 cc/hr  Problem # 5:  PPX Heparin 5000 U Edison TID  Problem # 6:  Dispo P clinical improvement  And blood Cx/Sens.   Complete Medication List: 1)  Lamotrigine 200 Mg Tabs  (Lamotrigine) .Marland Kitchen.. 1 tab by mouth two times a day 2)  Citalopram Hydrobromide 40 Mg Tabs (Citalopram hydrobromide) .Marland Kitchen.. 1-1/2 tabs by mouth daily 3)  Iron Supplement 325 (65 Fe) Mg Tabs (Ferrous sulfate) .Marland Kitchen.. 1 tab by mouth two times a day for anemia 4)  Seroquel 200 Mg Tabs (Quetiapine fumarate) .Marland Kitchen.. 1 tab by mouth two times a day 5)  Prevident 5000 Sensitive 1.1-5 % Pste (Sod fluoride-potassium nitrate) .... Use as toothpaste.

## 2010-12-02 NOTE — Discharge Summary (Signed)
NAME:  Kimberly [name] NO.:  [id number]  MEDICAL RECORD NO.:  [id number]           PATIENT TYPE:  E  LOCATION:  [facility]                         FACILITY:  [facility]  PHYSICIAN:  Paula Compton, MD        DATE OF BIRTH:  29-Aug-1971  DATE OF ADMISSION:  11/22/2010 DATE OF DISCHARGE:                              DISCHARGE SUMMARY   PRIMARY CARE PROVIDER:  Angelena Sole, MD, at [facility].  DISCHARGE DIAGNOSES: 1. Right lower leg cellulitis versus superficial thrombophlebitis. 2. Viral illness. 3. Bipolar disorder.  DISCHARGE MEDICATIONS: 1. Celexa 40 mg p.o. daily. 2. Doxycycline 100 mg p.o. b.i.d., take for 5 days. 3. Ibuprofen 600 mg p.o. q.6 p.r.n. for pain. 4. Nicotine patch 7 mg transdermally daily. 5. Lamotrigine 200 mg p.o. at bedtime. 6. Seroquel 200 mg; 2 tabs p.o. at bedtime.  PERTINENT LABORATORY VALUES:  On November 22, 2010, CBC, white blood cell count 13.5, hemoglobin 13.8, hematocrit 41.7, platelets 204,000, normal differential.  Basic metabolic panel, sodium 134, potassium 3.5, chloride 100, CO2 21, BUN 4, creatinine 1.15, glucose 91, calcium 8.9. Urinalysis was within normal limits.  Urine pregnancy test was negative. Influenza panel was negative for influenza A and B.  Blood cultures were negative x2 days.  Respiratory culture was negative.  RADIOLOGY:  On November 22, 2010, a chest x-ray, two-view, showed slightly increased markings at right lung base on PA view, question of minimal atelectasis or less likely infiltrate.  On November 23, 2010, a chest x-ray, two-view, showed minimal persistent peribronchial thickening, improved aeration, right base.  BRIEF HOSPITAL COURSE:  [name] is a 40 year old female with past medical history of bipolar disorder who presented to [facility] complaining of several days of fevers and chills as well as right leg erythema and pain. 1. Right leg pain.  This was most  likely superficial thrombophlebitis     versus cellulitis.  The patient was initially started on vancomycin     on admission and was transitioned to p.o. doxycycline.  The patient     was placed on NSAIDs for management of thrombophlebitis.  On     admission, lines were drawn around the redness on the patient's leg     was on the medial side of her thigh and calf.  On the day of     discharge, the erythema and pain had decreased significantly.  She     was discharged home to complete a course of doxycycline as well as     with ibuprofen. 2. Fever and body aches.  The patient was admitted and initially     treated for possible community-acquired pneumonia with ceftriaxone     and azithromycin.  Influenza swabs were also acquired which were     negative.  Family Medicine Team felt that the patient did not have     pneumonia because he had no respiratory symptoms.  Her clinical     exam showed no crackles on auscultation and it was felt that the     patient may have had an upper respiratory infection versus feeling     fever and  chills from the leg pain. 3. Psych.  The patient has a history of bipolar disorder.  She was     kept on her home medications during this hospitalization and was     stable.  FOLLOWUP ISSUES AND RECOMMENDATIONS:  The patient is to follow up with Dr. Lelon Perla at [facility] in 4-6 weeks.    ______________________________ Ardyth Gal, MD   ______________________________ Paula Compton, MD    CR/MEDQ  D:  11/24/2010  T:  11/25/2010  Job:  [id number]  Electronically Signed by Ardyth Gal MD on 11/26/2010 12:30:11 PM Electronically Signed by Paula Compton MD on 11/27/2010 11:54:07 AM

## 2010-12-05 ENCOUNTER — Encounter: Payer: Self-pay | Admitting: Family Medicine

## 2010-12-07 ENCOUNTER — Encounter: Payer: Self-pay | Admitting: Family Medicine

## 2010-12-14 ENCOUNTER — Other Ambulatory Visit: Payer: Self-pay | Admitting: Family Medicine

## 2010-12-14 DIAGNOSIS — F319 Bipolar disorder, unspecified: Secondary | ICD-10-CM

## 2010-12-14 NOTE — Telephone Encounter (Signed)
Refill request

## 2010-12-18 NOTE — H&P (Signed)
NAME:  Kimberly [name] NO.:  [id number]  MEDICAL RECORD NO.:  [id number]           PATIENT TYPE:  E  LOCATION:  [facility]                         FACILITY:  [facility]  PHYSICIAN:  Paula Compton, MD        DATE OF BIRTH:  [birth year]  DATE OF ADMISSION:  11/22/2010 DATE OF DISCHARGE:                             HISTORY & PHYSICAL   PRIMARY CARE PHYSICIAN:  Dr. Renea Ee at [facility].  CHIEF COMPLAINT:  Headache and cough with fever x2 days.  HISTORY OF PRESENT ILLNESS:  The patient complains of headache, cough, fever, and chills that all started acutely on Monday evening.  She was seen at [facility] Monday afternoon for an unrelated matter and felt fine at that time. She became acutely ill on Monday evening.  All symptoms started at the same time.  She also noticed swelling on her right thigh that spread down at the anterior aspect of right side to her right leg on Monday that was associated with pain and redness.  Regarding her flu-like illness along with the above, she endorses productive cough and small amount of blood occasionally, chest pain when she coughs, decongestion.  She denies sick contacts. Regarding her right leg, swelling with redness and pain.  She denies trauma and bug bites to her right leg.  Code status is full.  ED course, she received Rocephin, azithromycin, and vancomycin in the ED, also Tylenol in 1 liter normal saline.  Her husband is Mr. Freddrick March, phone number [phone].  ALLERGIES:  HYDROCODONE.  PAST MEDICAL HISTORY:  Significant for postpartum depression, bipolar disorder.  FAMILY HISTORY:  Significant for asthma and diabetes in first-degree relative as well as hypertension in first-degree relatives.  SOCIAL HISTORY:  The patient lives with husband who is a Education officer, environmental at [employer].  Has 5 children.  She is a housewife.  She smokes 5 cigarettes per day down from 2 packs per day.  She is  disabled secondary to bipolar disorder.  Does have a cat at home.  PHYSICAL EXAMINATION:  VITALS REVIEWED:  Temperature of 101.4, pulse 121, respiratory rate 20, blood pressure 114/75, saturating 96-100% on room air.  She is in mild distress. HEAD:  Normocephalic, atraumatic. EYES:  Vision grossly intact. MOUTH:  Moist mucous membranes, poor dentition.  Pharynx pink and moist. NECK:  No masses, no lymphadenopathy. LUNGS:  Slight increased work of breathing, rhonchorous bilaterally. Expiratory wheezes bilaterally that are mild. HEART:  Normal rate and regular rhythm.  S1, S2 normal without gallops, murmurs, clicks, rub, or other extra sounds. ABDOMEN:  Soft, nontender, and normal bowel sounds.  She is obese. PULSES:  Radial and dorsalis pedis are full and equal bilaterally. EXTREMITIES:  Right lower extremity redness, erythema, and mild edema in a continuous distribution along her anterior thigh to her anterior leg. Distribution is at most 4 cm, at least 2 cm wide.  The skin is warm to touch and tender to touch.  Left leg is normal. NEURO:  Strength normal in all extremities.  Sensation intact to light touch bilaterally. PSYCH:  No apparent hallucinations, slightly agitated, and good eye  contact.  ADDITIONAL EXAM:  CBC:  135 white blood cells, hemoglobin and hematocrit 13.8/41.7, platelets 204,000.  Neutrophils 68%.  BMET within normal limits.  Sodium 134.  Urine pregnancy negative.  UA negative.  Dopplers of lower extremity negative.  Chest x-ray, slightly increased markings at right lung base on PA view, question minimal atelectasis or less likely infiltrate.  ASSESSMENT/PLAN:  A 40 year old female presenting with headache, fever and chills, cough. 1. Influenza versus community-acquired pneumonia symptoms and physical     exam as well as imaging most consistent with influenza, however,     bacterial pneumonia is possible.  We will continue antibiotics     including Rocephin,  azithromycin, and vancomycin pending blood     cultures.  We will also obtain flu PCR, admit to floor with     influenza like illness precautions.  Repeat chest x-ray in the     morning. 2. Right lower extremity edema, cellulitis, superficial     thrombophlebitis, deep venous thrombosis ruled out with negative     Doppler, most likely the latter given distribution.  We will     contact continue vancomycin and follow blood cultures, also     ibuprofen as the patient would benefit if symptoms are secondary to     thrombophlebitis. 3. Bipolar disorder.  Continue home psych meds. 4. Fluids, electrolytes, and nutrition/gastrointestinal.  Heart     healthy diet. Normal saline at 100 mL per hour. 5. DVT Prophylaxis.  Heparin 5000 units subcu t.i.d. 6. Disposition pending clinical improvement and blood culture and     sensitivity.  The patient's current medications include lamotrigine 200 mg 1 tablet p.o. twice a day, citalopram 60 mg p.o. daily, iron supplement 325 one tablet p.o. twice a day for anemia, Seroquel 200 mg 1 tablet p.o. twice a day, and PreviDent 5000 Sensitive 1 to 1.5% paste to use a tooth paste.    ______________________________ Dessa Phi, MD   ______________________________ Paula Compton, MD    JF/MEDQ  D:  11/22/2010  T:  11/23/2010  Job:  [id number]  cc:   Dr. Allyne Gee  Electronically Signed by Dessa Phi MD on 12/10/2010 11:09:48 AM Electronically Signed by Paula Compton MD on 12/18/2010 09:46:49 AM

## 2010-12-19 ENCOUNTER — Other Ambulatory Visit: Payer: Self-pay | Admitting: Family Medicine

## 2010-12-19 NOTE — Telephone Encounter (Signed)
Refill request

## 2010-12-25 ENCOUNTER — Encounter: Payer: MEDICARE | Admitting: Family Medicine

## 2011-01-31 ENCOUNTER — Ambulatory Visit: Payer: MEDICARE | Admitting: Family Medicine

## 2011-02-02 ENCOUNTER — Encounter: Payer: Self-pay | Admitting: Family Medicine

## 2011-02-02 ENCOUNTER — Ambulatory Visit (INDEPENDENT_AMBULATORY_CARE_PROVIDER_SITE_OTHER): Payer: Medicare Other | Admitting: Family Medicine

## 2011-02-02 DIAGNOSIS — M542 Cervicalgia: Secondary | ICD-10-CM | POA: Insufficient documentation

## 2011-02-02 DIAGNOSIS — R234 Changes in skin texture: Secondary | ICD-10-CM | POA: Insufficient documentation

## 2011-02-02 DIAGNOSIS — L988 Other specified disorders of the skin and subcutaneous tissue: Secondary | ICD-10-CM

## 2011-02-02 NOTE — Assessment & Plan Note (Signed)
She has tried all topical treatments without any improvement.  This is likely a sign of chronic dehydration.  She doesn't drink any water and only drinks dehydrating liquids like coffee and soda.  Advised her to slowly increase her water intake and decrease her soda intake.  She will try.

## 2011-02-02 NOTE — Progress Notes (Signed)
  Subjective:    Patient ID: Kimberly [name], female    DOB: [birth year], 40 y.o.   MRN: [id number]  HPI 1. Sore neck:  Has had it for about 3 weeks.  It goes to her jaw and sometimes down to her clavicle.  Worse with palpation or with neck flexion.  She had noticed a small bump there.  This was associated with some URI type symptoms like sore throat and a cough.  Now it is completely resolved. She was hospitalized for a snake or spider bite about 1 month ago.  2. Dry feet:  Has had this since she was a baby.  She has tried everything.  Most recently has been to [employer].  She doesn't drink any water.  Drinks only coffee and soda.   Review of Systems Denies fevers, chills, headache, sore throat, neck stiffness, chest pain, shortness of breath, hoarseness    Objective:   Physical Exam  Constitutional: No distress.       Obese  HENT:       Right neck: normal exam.  No adenopathy.  Full ROM.  Non-tender  Neck: Normal range of motion. Neck supple. No JVD present. No tracheal deviation present. No thyromegaly present.       Nontender  Cardiovascular: Normal rate and regular rhythm.   Pulmonary/Chest: Effort normal and breath sounds normal. No stridor.  Lymphadenopathy:    She has no cervical adenopathy.  Skin:       Bilateral feet:  Dry soles with flaking          Assessment & Plan:

## 2011-02-02 NOTE — Assessment & Plan Note (Signed)
Likely from swollen lymph node.  It has since resolved.  Continue to monitor for recurrence.

## 2011-02-02 NOTE — Patient Instructions (Signed)
I'm glad that your neck is feeling better, if it starts to hurt again please let me know For your feet, the important thing is to keep your feet moisturized.  Apply moisturizer at least twice a day and work your way up to drinking at least 8 glasses of water a day

## 2011-03-06 NOTE — Group Therapy Note (Signed)
NAME:  Kimberly [name] NO.:  [id number]   MEDICAL RECORD NO.:  [id number]          PATIENT TYPE:  WOC   LOCATION:  [facility]                   FACILITY:  [facility]   PHYSICIAN:  Scheryl Darter, MD       DATE OF BIRTH:  11-08-1970   DATE OF SERVICE:  11/24/2008                                  CLINIC NOTE   CHIEF COMPLAINT:  Pelvic pressure and leaking urine.   Patient is a 40 year old white female, gravida 6, para 5, abortus 1.  Last menstrual period October 27, 2008 with somewhat irregular periods  who referred back to our clinic from [facility] for  evaluation of possible bladder prolapse.  She says she has some pressure  and bulging at the vagina which she usually notices when she is on the  toilet.  She has symptoms of stress incontinence of urine.  Her periods  are irregular.  She only bleeds heavily on 1 day and this lasts for  about 4 days.  She was seen January 15, 2007 for a similar evaluation.  Since her last visit, she had a miscarriage in January 2009.  She still  does not want to be sterilized.   PAST MEDICAL HISTORY:  1. Depression.  2. Obesity.   MEDICATIONS:  1. Citalopram 60 mg 2 tablets a day.  2. __________ 2 tablets daily.   She has a sensitivity to NONSTEROIDAL ANTI-INFLAMMATORY DRUGS.   Patient is married and a smoker.   PHYSICAL EXAMINATION:  Patient in no acute distress.  Weight 235.8  pounds, height 5 foot 9.  BP 121/88, pulse 98, temperature 97.3.  Patient missing several of her front upper teeth.  ABDOMEN:  Obese, soft, nontender.  EXAM EXTERNAL GENITALIA:  Vagina/cervix showed only a mild cystocele  which, on exam when she squats, is perhaps first-degree.  There is no  uterine prolapse, uterus is normal size, no adnexal masses.  She does  have small amount of urinary incontinence when squatting and straining.   IMPRESSION:  Mild cystocele and stress urinary incontinence.   PLAN:  Patient still does not want to have a  procedure that will cause  sterilization.  We discussed that if she does want to have a future  pregnancy, any procedures for repair of the cystocele or to correct  stress urinary incontinence  would suffer during pregnancy.  She will consider her surgical options  but we recommend further evaluation of her bladder function and made a  urology referral.  She will return after that evaluation.      Scheryl Darter, MD     JA/MEDQ  D:  11/24/2008  T:  11/24/2008  Job:  [id number]

## 2011-03-06 NOTE — Procedures (Signed)
NAME:  Kimberly [name] NO.:  [id number]   MEDICAL RECORD NO.:  [id number]          PATIENT TYPE:  OUT   LOCATION:  SLEEP CENTER                 FACILITY:  [facility]   PHYSICIAN:  Clinton D. Maple Hudson, MD, FCCP, FACPDATE OF BIRTH:  Sep 21, 1971   DATE OF STUDY:  04/02/2007                            NOCTURNAL POLYSOMNOGRAM   REFERRING PHYSICIAN:  Towana Badger, M.D.   INDICATION FOR STUDY:  Insomnia with sleep apnea.   EPWORTH SLEEPINESS SCORE:  17/24, BMI 35, weight 237 pounds.   MEDICATIONS:  Home medication listed and reviewed.   SLEEP ARCHITECTURE:  Total sleep time 414 minutes with sleep efficiency  96%.  Stage I was 3%, stage II 71%, stages III and IV 11%, REM 15% of  total sleep time.  Sleep latency 9 minutes, REM latency 228 minutes,  awake after sleep onset 12 minutes, arousal index 10.4.  She had taken  Ambien CR 12.5 mg, Trazodone 100 mg, Seroquel 300 mg, and Lexapro 20 mg,  all prior to arrival at the sleep center, stating she wanted to be sure  she could sleep.   RESPIRATORY DATA:  Apnea/hypopnea index (AHI, RDI) 4.5 obstructive  events per hour, which is within the upper limits of normal (normal  range 0-5 per hour).  There were insufficient events to qualify for CPAP  titration by split protocol on this study night.   OXYGEN DATA:  Mild intermittent snoring with oxygen desaturation  transiently to a nadir of 82%.  Main oxygen saturation through the study  was 95% on room air.   CARDIAC DATA:  Normal sinus rhythm.   MOVEMENT-PARASOMNIA:  A total of 45 limb jerks were recorded, of which  24 were associated with arousal or awakening for periodic limb movement  with arousal index of 3.5 per hour.  Which is of doubtful significance.   IMPRESSIONS-RECOMMENDATIONS:  1. Unremarkable sleep architecture except that deep uninterrupted      sleep characterized the first half of the night, typical of drug      effect.  By about 2 a.m. she began showing some sleep  stage      cycling, including REM.  2. Occasional sleep disordered breathing events were noted with a      frequency within normal limits (AHI 4.5 per hour, normal range 0-5      per hour).  3. Periodic limb movement with arousal, 3.5 per hour.  4. Note the significant number of sedating and mental status altering      medications that she took before arrival at the center.  We cannot      comment on what her sleep cycle would look withdrawn from these.      Clinton D. Maple Hudson, MD, [facility], FACP  Diplomate, Biomedical engineer of Sleep Medicine  Electronically Signed     CDY/MEDQ  D:  04/06/2007 16:35:52  T:  04/07/2007 01:36:35  Job:  [id number]

## 2011-03-09 NOTE — Discharge Summary (Signed)
NAMEBud [name] NO.:  [id number]   MEDICAL RECORD NO.:  [id number]          PATIENT TYPE:  INP   LOCATION:  9151                          [facility]   PHYSICIAN:  Broadus John T. Pickard II, MDDATE OF BIRTH:  Apr 01, 1971   DATE OF ADMISSION:  11/16/2004  DATE OF DISCHARGE:  11/21/2004                                 DISCHARGE SUMMARY   ADMISSION DIAGNOSES:  1.  Footling breech.  2.  Preterm labor.  3.  Intrauterine pregnancy at 27-2/7 weeks based on a 9-5/7 weeks      ultrasound.   DISCHARGE DIAGNOSES:  1.  Footling breech.  2.  Polyhydramnios secondary to gestational diabetes mellitus controlled by      diet.  3.  Preterm labor aborted with tocolytics.   PROCEDURES:  OB ultrasound dated November 16, 2004, shows a single fetus in  the footling breech position with an AFI of 23.1 cm, the 95th percentile for  this gestational age, estimated fetal weight of 1503 g, which is 90th  percentile for 29 weeks.  The cervix is 1.9 cm transvaginally.   LABORATORY DATA:  One hour Glucola 92.  Urinalysis is normal.  Fetal  fibronectin is positive.  Wet Prep, GC, Chlamydia and GBS were all negative.   HISTORY AND PHYSICAL:  The patient is a 40 year old African American female,  G4, P3-0-0-3 who presents at 27-2/7 weeks based on a 9-5/7 week ultrasound  presenting with occasional contractions, intact membranes, no vaginal  bleeding and normal fetal activity.  However, she was seen in ultrasound due  to size greater than dates for a growth check and was found to have four  contractions, footling breech with an adjacent cord, also found to have a  shortened cervix, and polyhydramnios.  Prenatal course and source of care  was obtained at the [facility].   MEDICATIONS:  1.  Prenatal vitamins.  2.  Albuterol p.r.n.   ALLERGIES:  NSAID.   PAST OBSTETRICAL HISTORY:  Pregnancies 1-3 were spontaneous vaginal  deliveries at 40 weeks, however, did have a history  of preterm labor on her  third baby requiring bed rest.   GYNECOLOGIC HISTORY:  Negative.   PAST MEDICAL HISTORY:  Asthma.   PAST SURGICAL HISTORY:  Negative.   SOCIAL HISTORY:  Significant for tobacco approximately half a pack per day.  No alcohol.  No drugs.   PRENATAL LABS:  The patient is O positive, antibody screen negative.  Hemoglobin 11.7, platelets 379.  Equivocal rubella.  Hepatitis B is  negative.  Syphilis nonreactive.  HIV nonreactive.   ADMISSION PHYSICAL EXAMINATION:  Digital cervical examination of 1 to 2 cm,  50% effaced with a palpable foot.  Baseline heart rate of the baby is 140  with good variability. The patient was having two to three contractions in  10 minutes that were irregular.  She was admitted with threatened preterm  labor and cervical change, footling breech, polyhydramnios and large for  gestational age baby.   HOSPITAL COURSE:  PROBLEM #1 -  PRETERM LABOR:  The patient was admitted.  GC, Chlamydia, Wet Prep, CBG and fetal fibronectin  were all obtained.  The  fetal fibronectin did return positive, however, all the other labs were  negative.  The patient was started on magnesium given the fact she was  having some uterine contractions.  She was also placed on Unasyn 3 g q.6h.  to cover for unknown GBS status.  Betamethasone was given x2 on November 16, 2004, and November 17, 2004.  The following morning, her cervical examination  had changed to a loose 1 but was more effaced at 75% and high.  The patient  was continued on the magnesium as a tocolytic and Motrin 600 mg p.o. q.6h.  was added for 72 hours.  The patient was also continued on Unasyn.  Afterwards, the patient stopped having contractions and repeat cervical  examinations on the day prior to discharge showed that the patient was one  external, fingertip internal, 25% effaced, soft and high.  The magnesium was  discontinued along with the Unasyn and the ibuprofen.  The patient was  observed  for another 24 hours on Procardia 30 mg XL p.o. b.i.d.  She had no  further contractions.  Cervical examination the morning of discharge showed  no cervical change.  The patient was still fingertip internal, 25% effaced,  soft and high.  She was discharged home on Procardia as a tocolytic.   PROBLEM #2 -  POLYHYDRAMNIOS AND LARGE FOR GESTATIONAL AGE BABIES:  The  patient was brought to the hospital and placed on an ADA diet.  CBGs were  obtained, fasting and two hours post prandial.  Having received  betamethasone, her CBGs in the morning were 136.  However, 24 hours after  the betamethasone, her a.m. CBG was 95.  The day prior to discharge, her CBG  was 83 in the morning fasting and then 76 the day of discharge fasting in  the morning.  It appeared that the patient's blood sugars were controlled on  an ADA diet which she was discharged home with.  She was also given a  Glucometer and instructions on how to use the Glucometer to check her blood  sugars.  The patient was instructed to check her blood sugars fasting in the  morning and two hours after meals, record those down and bring them to the  high risk appointment.   PROBLEM #3 -  FOOTLING BREECH:  The patient's care will be transferred from  Dr. Evonnie Pat at the [facility] to the [facility] as the  patient will need a C. section given her presentation.   DISCHARGE MEDICATIONS:  1.  Procardia XL 30 mg p.o. b.i.d.  2.  Glucometer with testing strips and instructions on how to check her      blood sugars and instructions to check fasting a.m. CBGs along with two      hour post prandial CBGs.   FOLLOW UP INSTRUCTIONS:  The patient is instructed to follow up at the [facility]  Risk Clinic here at [facility] on November 23, 2004, at 10:45 in the  morning.      WTP/MEDQ  D:  11/21/2004  T:  11/21/2004  Job:  [id number]   cc:   Lawerance Sabal, MD  Fax: [phone]

## 2011-03-09 NOTE — Group Therapy Note (Signed)
NAME:  Kimberly [name] NO.:  [id number]   MEDICAL RECORD NO.:  [id number]          PATIENT TYPE:  WOC   LOCATION:  [facility]                   [facility]AC[facility]  [facility]   PHYSICIAN:  Argentina Donovan, MD        DATE OF BIRTH:  [birth year]   DATE OF SERVICE:                                  CLINIC NOTE   The patient is a 40 year old gravida 5 para 5-0-0-5 who since her last  baby has had increasingly heavy, disabling, painful periods with  extraordinary pelvic pressure radiating in her back and down into the  tops of her thighs.  She also has developed significant urinary stress  incontinence.   EXAMINATION:  She has a uterus which is upper limits of normal with a  second degree cystourethrocele and a first degree rectocele.  She says  that coitus has become terribly painful and not pleasurable any more,  bothering her husband.  On the other hand, her plan was to have one more  child.  We have discussed this with the patient.  I told her that the  best treatment for this because she probably has adenomyosis would be to  have a vaginal hysterectomy with an anterior posterior repair and a TVT.  I said that if the bladder itself were supported and she had another  child it probably would have to be redone at a certain time and there is  not anything that I can suggest that would help the pain from her  periods or the increasingly heavy periods with the condition that she  has.  She is going to discuss it with her husband.  If she decides to go  through the procedure and calls, I will schedule her for a vaginal  hysterectomy, the anterior-posterior colporrhaphy, and TVT.   DIAGNOSIS:  Adenomyosis with symptomatic cystorectocele           ______________________________  Argentina Donovan, MD     PR/MEDQ  D:  01/15/2007  T:  01/15/2007  Job:  [id number]

## 2011-03-09 NOTE — Op Note (Signed)
NAME:  Kimberly [name] NO.:  [id number]   MEDICAL RECORD NO.:  [id number]          PATIENT TYPE:  INP   LOCATION:  9121                          [facility]   PHYSICIAN:  Lesly Dukes, M.D. DATE OF BIRTH:  1971/08/15   DATE OF PROCEDURE:  04/11/2006  DATE OF DISCHARGE:  04/12/2006                                 OPERATIVE REPORT   PREOPERATIVE DIAGNOSES:  The patient is a 39 year old female with retained  products of conception.   POSTOPERATIVE DIAGNOSES:  The patient is a 40 year old female with retained  products of conception.   PROCEDURE:  Dilation and vacuum curettage.   SURGEON:  Lesly Dukes, M.D.   ANESTHESIA:  MAC and local.   SPECIMENS:  Endometrial curettings to pathology.   EBL:  200 cc.   COMPLICATIONS:  None.   DESCRIPTION OF PROCEDURE:  After informed consent was obtained, the patient  was taken to the operating room, where MAC anesthesia was given.  The  patient was placed in dorsal lithotomy position and prepared in normal  sterile fashion.  A bivalve speculum was placed into the patient's vagina  and 20 cc of 1% lidocaine were injected at 3 and 9 o'clock.  The cervix was  already dilated, so gentle suction curettage was performed.  The suction  curet was removed and gentle sharp curettage was performed.  Tissue was sent  to pathology.  There was good hemostasis and the uterus was firm at the end  of the procedure.  The patient tolerated the procedure well.  All  instruments were removed from the patient's vagina.  Sponge, lap, instrument  and needle counts were correct times two and the patient went to the  recovery room in stable condition.     ______________________________  Lesly Dukes, M.D.    ______________________________  Lesly Dukes, M.D.    KHL/MEDQ  D:  05/29/2006  T:  05/29/2006  Job:  [id number]

## 2011-03-09 NOTE — Discharge Summary (Signed)
NAMEBud [name] NO.:  [id number]   MEDICAL RECORD NO.:  [id number]          PATIENT TYPE:  INP   LOCATION:  9121                          FACILITY:  [facility]   PHYSICIAN:  Ginger Carne, MD  DATE OF BIRTH:  Aug 28, 1971   DATE OF ADMISSION:  04/11/2006  DATE OF DISCHARGE:  04/12/2006                                 DISCHARGE SUMMARY   ADMITTING DIAGNOSIS:  17 days  postpartum hemorrhage.   DISCHARGE DIAGNOSES:  1.  Subinvolution of the placental sac.  2.  Status post dilation and curettage.  3.  Status post transfusion.   ADMITTING HISTORY AND PHYSICAL:  The patient is a 40 year old G5 P5 who  delivered on March 25, 2006 and has had heavy bleeding ever since, worse the  day of presentation.  She went through seven pads in one hour.  She also  complains of a bulge coming out when she urinates.  She came into the MAU  with pad, underwear, and a towel soaked with blood.   MEDICATIONS:  Percocet, ibuprofen, Zoloft.   ALLERGIES:  None.   OB HISTORY:  SVD x5.   GYN HISTORY:  1.  Condyloma.  2.  Possible uterine prolapse.   MEDICAL HISTORY:  1.  Murmur.  2.  Anemia.  3.  Asthma.   PHYSICAL EXAMINATION:  VITAL SIGNS:  Temperature 98.4, pulse 121,  respiratory rate 18, blood pressure 122/76.  GENERAL:  Pale, in moderate distress.  CHEST:  Clear to auscultation bilaterally.  HEART:  She did have a 2/6 murmur.  ABDOMEN:  Fundus palpable about 4 cm above the umbilicus, and uterus is  boggy.  GENITALIA:  External genitalia was normal.  There was blood on the external  genitalia.  Cervix was obliterated by blood which was profusely coming out  of the cervical os.  Around 200 cc of blood was removed from the vaginal  vault.  Bimanual exam:  Her cervix was minimally dilated.  The uterus was  low.  Her uterus was felt to be prolapsed into the vaginal vault.   HOSPITAL COURSE:  The patient was admitted.  She was given IV fluid  resuscitation in the MAU.  She  was transfused 2 units of packed red blood  cells.  She was given Pitocin and Methergine.  Her bleeding did decrease  significantly.  She did, however, have an increased bleeding episode the  evening of April 11, 2006, and underwent a D&C by Dr. Penne Lash.  Please see  operative note for more information.  The patient remained stable status  post transfusion, and her pain was controlled.  Her fundus at the time of  discharge was below the umbilicus and firm.  The patient was given IV Zosyn  throughout her hospitalization.   LABORATORIES:  Admission labs:  PT 14.4, INR 1.1, PTT 34.  Fibrinogen to  388.  Hemoglobin 8.5.  Discharge hemoglobin 7.7.   DISCHARGE MEDICATIONS:  1.  Ambien 10 mg 1 p.o. at bedtime p.r.n. insomnia, #5.  2.  Augmentin 875 mg 1 p.o. b.i.d., #12.  3.  Percocet 5, 1 p.o. q.6 h. p.r.n. pain, #20.  ______________________________  August Saucer Merlene Morse, MD      Ginger Carne, MD  Electronically Signed    ABC/MEDQ  D:  04/12/2006  T:  04/12/2006  Job:  [id number]

## 2011-04-19 ENCOUNTER — Other Ambulatory Visit: Payer: Self-pay | Admitting: Family Medicine

## 2011-04-19 DIAGNOSIS — F319 Bipolar disorder, unspecified: Secondary | ICD-10-CM

## 2011-04-19 NOTE — Telephone Encounter (Signed)
Refill request

## 2011-04-30 ENCOUNTER — Encounter: Payer: Self-pay | Admitting: Family Medicine

## 2011-04-30 ENCOUNTER — Ambulatory Visit (INDEPENDENT_AMBULATORY_CARE_PROVIDER_SITE_OTHER): Payer: Medicare Other | Admitting: Family Medicine

## 2011-04-30 DIAGNOSIS — F329 Major depressive disorder, single episode, unspecified: Secondary | ICD-10-CM

## 2011-04-30 DIAGNOSIS — L988 Other specified disorders of the skin and subcutaneous tissue: Secondary | ICD-10-CM

## 2011-04-30 DIAGNOSIS — F32A Depression, unspecified: Secondary | ICD-10-CM

## 2011-04-30 DIAGNOSIS — F3289 Other specified depressive episodes: Secondary | ICD-10-CM

## 2011-04-30 DIAGNOSIS — R234 Changes in skin texture: Secondary | ICD-10-CM

## 2011-04-30 NOTE — Progress Notes (Signed)
  Subjective:    Patient ID: Kimberly [name], female    DOB: 29-May-1971, 40 y.o.   MRN: [id number]  HPI  First time meeting patient today. She states she has a few concerns but her primary concern is her depression.  1. Depression- Pt states her depression has been "out of control" lately. She feels it has been getting worse since her Seroquel became a generic medication and states it "is not working".  She states she cries multiple times daily, has decreased concentration, decreased appetite, decreased sleep, frequent bad dreams. Denies any elevated mood. Denies SI/HI. Her depression began 5 years ago when her mother was murdered by her aunt. Soon after that, she lost a pregnancy and had to have her teeth pulled. She said all of those things together took a toll on her. After that, she was put on medication and went to therapy for 2 years. Pt states she has a lot of anxiety and feels like she is "trapped in a corner". She does not like to be in rooms with the doors closed. She does not like for people to touch her, specifically she mentioned her children. She does not like to talk to other people. She says all of these things make her very anxious. She endorses a lot of stress at home with her 5 children (ages 14, 82, 23, 52, and 5). She says she does not take her stress out on them, but she mentions many times how she does not cope well with them. She says she has a good support system with neighbors, friends and her husband. Her husband is a Education officer, environmental.  Her current medications are Celexa 40mg  (1.5 tabs at night), Lamictal 200mg  (2 tabs at night) and Seroquel (2 tabs at night). She does not take medication during the day because she feels it makes her less alert and she does not feel that is safe for her children.  At this time, she states she does not feel she needs hospitalization and she "feels safe" but would like to get her depression under control.  2. Callous on feet- She uses Vasoline daily on dry  skin. She also trims the skin as needed. She states it is painful for her to walk on her feet and she needs relief.  3. Chronic back pain- we agreed together that it is more important to focus on her depression today, but we will readdress this issue at her next appointment.    Review of Systems  Constitutional: Positive for activity change.  Respiratory: Positive for shortness of breath.   Cardiovascular: Negative for chest pain.  Musculoskeletal: Positive for back pain.  Neurological: Positive for dizziness and light-headedness.  Psychiatric/Behavioral: Positive for sleep disturbance and decreased concentration. Negative for suicidal ideas and self-injury. The patient is nervous/anxious.        Objective:   Physical Exam  Constitutional: She appears well-developed and well-nourished.  HENT:  Head: Atraumatic.  Cardiovascular: Regular rhythm.        Tachycardic  Pulmonary/Chest: She has wheezes (R>L).  Neurological: She is alert.  Skin: Skin is warm and dry.  Psychiatric: Her mood appears anxious. Her speech is delayed and slurred. She is slowed. She is not agitated and not aggressive. She does not express impulsivity or inappropriate judgment. She exhibits a depressed mood. She expresses no homicidal and no suicidal ideation.          Assessment & Plan:

## 2011-04-30 NOTE — Assessment & Plan Note (Addendum)
Continues to bother her, R>L. She applies vaseline and creams nightly, and states she "cuts off" the hard, dry skin. After discussing this with Dr. Jennette Kettle, we feel it is appropriate for her to be seen for an orthotic evaluation at [facility]. If wearing the appropriate insole, the weight will shift and hopefully help with pain and healing of the cracked skin. She was instructed to wear the shoes she wears most often to the appointment with [facility].

## 2011-04-30 NOTE — Assessment & Plan Note (Addendum)
Previous dx of Bipolar I in her problem list. Depression is her major concern today. She has no symptoms of mania. She is taking her medications every night, but she feels like they are not "working." She has tried therapy in the past. Given she is on 3 medications at this point in time, we agreed it would be best for her to be seen in the [facility]. The pt and I discussed the goals of that referral were to figure out if she is on the appropriate medications at the correct doses, as well as to give her an opportunity to talk about what is going on. She seemed very optimistic about this visit, and I appreciate Dr. Carola Rhine input.  She is continuing medications as prescribed (no refills given today). She was told if she felt like hurting herself or anyone else to call the clinic immediately or go to the Emergency Room.

## 2011-04-30 NOTE — Patient Instructions (Addendum)
It was so nice to see you today!  - Please call Dr. Pascal Lux at [phone] to schedule an appointment in our [facility]. She will help with your medications. We will discuss what she suggested the next time you come to see me!  - The front office staff will make an appointment for you to see Sports [facility] clinic for a shoe insert evaluation. You will need to wear the shoes you wear most often to this appointment. I really think this will help with the sores on your feet.   If you feel like your depression is more than you can handle, please call me or go to the emergency room  I will see you back in the office soon!  Kimberly [name], M.D.

## 2011-05-04 ENCOUNTER — Telehealth: Payer: Self-pay | Admitting: Psychology

## 2011-05-04 NOTE — Telephone Encounter (Signed)
Patient called to discuss scheduling appt.  Dr. Algis Downs hope was to get her into [facility] for medication evaluation.  Patient reported she had been seen regularly at the [facility] for weekly therapy.  They have a psychiatrist that consults there as well.  Given the patient's chronic mental health issues, would suspect she needs a higher level of care than what my therapy clinic can provide and potentially than what [facility] can provide.  She said she was willing to call [facility] to see if she could get another appt.  She stopped going when her therapist of two years left.   Best approach would likely be case management services (not so much therapy as provision of support and connection to various resources).  She says she has never been to [facility] and I suspect this would prove overwhelming to her.  She has Medicare - not sure if a case worker / Child psychotherapist from the health department (??).  Will look into as discuss with Dr. Mikel Cella.  [name] wrote down the plan and I asked her to call me back if she had any difficulty.

## 2011-05-10 ENCOUNTER — Ambulatory Visit: Payer: Medicare Other | Admitting: Family Medicine

## 2011-05-15 ENCOUNTER — Ambulatory Visit: Payer: Medicare Other | Admitting: Sports Medicine

## 2011-05-21 ENCOUNTER — Ambulatory Visit: Payer: Medicare Other | Admitting: Family Medicine

## 2011-05-29 ENCOUNTER — Ambulatory Visit: Payer: Medicare Other | Admitting: Sports Medicine

## 2011-05-30 ENCOUNTER — Encounter: Payer: Self-pay | Admitting: Family Medicine

## 2011-05-30 ENCOUNTER — Ambulatory Visit (INDEPENDENT_AMBULATORY_CARE_PROVIDER_SITE_OTHER): Payer: Medicare Other | Admitting: Family Medicine

## 2011-05-30 DIAGNOSIS — F32A Depression, unspecified: Secondary | ICD-10-CM

## 2011-05-30 DIAGNOSIS — K0889 Other specified disorders of teeth and supporting structures: Secondary | ICD-10-CM | POA: Insufficient documentation

## 2011-05-30 DIAGNOSIS — F3160 Bipolar disorder, current episode mixed, unspecified: Secondary | ICD-10-CM

## 2011-05-30 DIAGNOSIS — F329 Major depressive disorder, single episode, unspecified: Secondary | ICD-10-CM

## 2011-05-30 DIAGNOSIS — L988 Other specified disorders of the skin and subcutaneous tissue: Secondary | ICD-10-CM

## 2011-05-30 DIAGNOSIS — K089 Disorder of teeth and supporting structures, unspecified: Secondary | ICD-10-CM

## 2011-05-30 DIAGNOSIS — R234 Changes in skin texture: Secondary | ICD-10-CM

## 2011-05-30 MED ORDER — AMOXICILLIN 875 MG PO TABS: 875.0000 mg | ORAL_TABLET | Freq: Two times a day (BID) | ORAL | Status: AC

## 2011-05-30 MED ORDER — QUETIAPINE FUMARATE 200 MG PO TABS: 200.0000 mg | ORAL_TABLET | Freq: Two times a day (BID) | ORAL | Status: DC

## 2011-05-30 NOTE — Progress Notes (Signed)
  Subjective:    Patient ID: Kimberly [name], female    DOB: [birth year], 40 y.o.   MRN: [id number]  HPI Pt is a 40 yo female presenting to office for a refill of her Seroquel. Pt had a few missed appointments and had not had her Seroquel refilled. (At her last appointment in July, she had one refill left on her bottle and it was not filled at that time with her other medications.) Pt has not had her dose in 5 days and therefore is not sleeping. Today in the office she was sad/upset as well as appeared to be in pain. Pt states her husband had a stroke recently and is not bed-ridden at home, which is stressful to her. Also, pt's tooth broke off last night and has caused her some pain. (She brought the tooth wrapped in paper towel for me to see.)    Review of Systems  Constitutional: Positive for activity change and fatigue. Negative for fever, chills and appetite change.  HENT: Positive for dental problem. Negative for neck pain.   Respiratory: Negative for shortness of breath.   Cardiovascular: Negative for chest pain.  Gastrointestinal: Negative for vomiting, diarrhea and constipation.  Musculoskeletal: Positive for myalgias.  Skin:       Cracked skin on feet  Neurological: Negative for numbness.  Psychiatric/Behavioral: Positive for sleep disturbance and agitation. The patient is nervous/anxious.        Objective:   Physical Exam  Vitals reviewed. Constitutional: She appears well-developed and well-nourished. She appears distressed.  HENT:  Head: Normocephalic and atraumatic.  Mouth/Throat: Dental caries (In majority of remaining teeth) present.    Eyes: Conjunctivae are normal. Pupils are equal, round, and reactive to light.  Neck: Normal range of motion.  Cardiovascular: Normal rate, regular rhythm and normal heart sounds.   Pulmonary/Chest: Effort normal. She has wheezes.  Abdominal: Soft. She exhibits no distension. There is no tenderness.  Musculoskeletal: Normal range of  motion.  Neurological: She is alert.  Skin: Skin is warm and dry.       Cracked skin on bottom of both feet. Dry, callous-like          Assessment & Plan:

## 2011-05-30 NOTE — Assessment & Plan Note (Signed)
Pt ran out of Seroquel and was not able to get it from pharmacy. Has had mania and not able to sleep for 5 days. Will restart medication TODAY. It was sent to [facility] on [address] and she will pick it up on her way home.

## 2011-05-30 NOTE — Assessment & Plan Note (Signed)
Pt unable to keep sports med appointment for insoles. Wearing tennis shoes today, but no improvement of pain from cracked skin. Continue to encourage use of creams until she can see sports med

## 2011-05-30 NOTE — Assessment & Plan Note (Signed)
Pt has poor detention, including having her 4 front teeth removed a few years ago. Remainder of teeth have caries. She had a tooth to break last night and it is causing a lot of pain. She does not have a dentist (and hasn't for a while.) Given a list of dentists and encouraged her to find one soon to have it extracted. In the meantime, I gave her Amoxicillin and told her to take Tylenol or Motrin for pain.

## 2011-05-30 NOTE — Patient Instructions (Signed)
It was good to see you today. I am sorry you aren't feeling well.  I have refilled your Seroquel to [facility], please pick it up today and start taking it again.  For your tooth, you are going to take Amox (antibiotic) for 1 week. Please call a dentist to make an appointment to be seen. You can take Motrin 600 mg every four hours or Tylenol 500mg  2 tablets every 6 hours.   I will see you back in one month or sooner if you need to see me.  Puneet Selden M. Amberley Hamler, M.D.

## 2011-06-12 ENCOUNTER — Ambulatory Visit (INDEPENDENT_AMBULATORY_CARE_PROVIDER_SITE_OTHER): Payer: Medicare Other | Admitting: Family Medicine

## 2011-06-12 VITALS — BP 139/89

## 2011-06-12 DIAGNOSIS — L84 Corns and callosities: Secondary | ICD-10-CM

## 2011-06-12 DIAGNOSIS — M79673 Pain in unspecified foot: Secondary | ICD-10-CM | POA: Insufficient documentation

## 2011-06-12 DIAGNOSIS — R269 Unspecified abnormalities of gait and mobility: Secondary | ICD-10-CM | POA: Insufficient documentation

## 2011-06-12 DIAGNOSIS — M79609 Pain in unspecified limb: Secondary | ICD-10-CM

## 2011-06-12 MED ORDER — UREA 35 % EX LOTN: TOPICAL_LOTION | CUTANEOUS | Status: DC

## 2011-06-12 NOTE — Progress Notes (Signed)
  Subjective:    Patient ID: Kimberly [name], female    DOB: [birth year], 40 y.o.   MRN: [id number]  HPI  1. Foot sores/callus. For as long as patient can remember, she has had pain on R>L soles of feet related to callus formation at base of 1st metatarsal joints. Significant build up of dead skin. She has attempted visit to pedicurists and had one visit at the [facility] where a very small amount of callus was removed. Has also tried various creams and ointments. The significant pain and cracking causes her to walk on the lateral borders of her feet. This in turn causes stress on knees and hips also.   Has a history of fracture rt great toe (proximal phalanx, nondisplaced) in 11/2009 that was managed conservatively. No longer having toe pain.  Review of Systems Denies bleeding, rash, ulcers, polyuria, numbness, tingling, joint pains.     Objective:   Physical Exam  Vitals reviewed. Constitutional: She is oriented to person, place, and time. She appears well-developed and well-nourished. No distress.  HENT:  Head: Normocephalic and atraumatic.  Pulmonary/Chest: Effort normal.  Musculoskeletal:       Gait: Patient balances on lateral foot margins to avoid contact of calluses onto the floor. Extremely antalgic gait.   Feet: Very heavy buildup of hypertrophic keratotic skin at base of 1st great toes (R>L) extending medially and laterally. Tender to palpation. Significant cracking/fissuring of layers. No ulcers. No toenail deformities.  Arches are intact.   Neurological: She is alert and oriented to person, place, and time. Coordination normal.  Psychiatric: She has a normal mood and affect.          Assessment & Plan:  1.Bilateral foot pain.  2.Abnormal gait.  3.Significant callus buildup with fissuring--potential for infection. We discussed at long term if she doesn't get this taken care of she may be at risk for infection, osteomyelitis, potentially loss of foot.. Today we did  debridement of the callus. We have to get that under local anesthesia because of the extent of the callus. We'll start her on Terazol cream and see her back in one month. We placed her in temporary insoles with long arch pads to relieve pressure from these areas.

## 2011-06-12 NOTE — Assessment & Plan Note (Addendum)
Significant pain and large area of involvement limits patient's ambulatory function. Patient underwent debridement** of several layers keratotic material on right 1st great toe callus today. We fitted patient with long arch pads positioned to relieve pressure from bilateral areas of involvement. Gave enzymatic debridment ointment to be used daily. Will follow up in 6 weeks. May need repeated debridement at that time depending on results.   **Consent was obtained. Semi-sterile debridement of right great toe callus: 8cc 1% lidocaine was used for digital block and local anesthesia. Cleaned with iodine swabs. 11 blade used to shave layers meticulously  revealing healthy epidermis/dermis beneath callus.

## 2011-06-28 ENCOUNTER — Ambulatory Visit: Payer: Medicare Other | Admitting: Family Medicine

## 2011-07-06 ENCOUNTER — Ambulatory Visit (INDEPENDENT_AMBULATORY_CARE_PROVIDER_SITE_OTHER): Payer: Medicare Other | Admitting: Family Medicine

## 2011-07-06 VITALS — BP 114/80

## 2011-07-06 DIAGNOSIS — M79673 Pain in unspecified foot: Secondary | ICD-10-CM

## 2011-07-06 DIAGNOSIS — M79609 Pain in unspecified limb: Secondary | ICD-10-CM

## 2011-07-06 DIAGNOSIS — L988 Other specified disorders of the skin and subcutaneous tissue: Secondary | ICD-10-CM

## 2011-07-06 DIAGNOSIS — R234 Changes in skin texture: Secondary | ICD-10-CM

## 2011-07-06 DIAGNOSIS — L84 Corns and callosities: Secondary | ICD-10-CM

## 2011-07-06 MED ORDER — AMMONIUM LACTATE 12 % EX LOTN: TOPICAL_LOTION | CUTANEOUS | Status: DC | PRN

## 2011-07-06 NOTE — Progress Notes (Signed)
  Subjective:    Patient ID: Kimberly [name], female    DOB: 1971-04-06, 40 y.o.   MRN: [id number]  HPI  Followup lateral with right greater than left foot pain. At last office visit we did significant debridement of her fissured callus formation on the plantar portion of her feet specifically on the right one. She was unable to buy the exfoliating ointment I had prescribed. She has tried to use the insoles. She doesn't feel that she is much better. She's quite frustrated.  She says she caught the age of the callus on her right foot on a metal strip in her house and pulled it backwards. Had some bleeding and some bruising. Since then it's been hurting worse.  Review of Systems Denies fever, denies erythema of the foot. Has no joint pains in the foot or ankle.    Objective:   Physical Exam  GENERAL: Well-developed female no acute distress  Foot: Right. She still has a lot of hypertrophic callus formation particularly under the first metatarsal head. It seems somewhat improved from last visit however. I see no sign of bleeding, no sign of fluctuance or infection. She is extremely tender to palpation in this area. She has intact sensation to soft touch bilaterally in her feet. She has been Refill bilateral feet. Foot. Left. Increase callus formation under the first metatarsal head but not nearly as bad as on the right. She has increasedcallous also on the medial heel.    Assessment & Plan:  Foot pain. I not sure how much she's been able to wear the support insoles as she finds them uncomfortable. She is also wearing them inside bedroom slippers. I gave her prescription for a short cam walker boot. I've asked him to play sports insult in this. We'll see if we can mobilize the foot a little bit and get this to calm down. I changed her prescription to [place]. He'll see her back in 2 weeks.

## 2011-07-20 ENCOUNTER — Ambulatory Visit: Payer: Medicare Other | Admitting: Family Medicine

## 2011-07-20 ENCOUNTER — Telehealth: Payer: Self-pay | Admitting: *Deleted

## 2011-07-20 ENCOUNTER — Other Ambulatory Visit: Payer: Self-pay | Admitting: Family Medicine

## 2011-07-20 DIAGNOSIS — F319 Bipolar disorder, unspecified: Secondary | ICD-10-CM

## 2011-07-20 MED ORDER — LAMOTRIGINE 200 MG PO TABS: 200.0000 mg | ORAL_TABLET | Freq: Two times a day (BID) | ORAL | Status: DC

## 2011-07-30 LAB — HCG, QUANTITATIVE, PREGNANCY: hCG, Beta Chain, Quant, S: <2

## 2011-07-31 LAB — HCG, QUANTITATIVE, PREGNANCY
hCG, Beta Chain, Quant, S: 129 — ABNORMAL HIGH
hCG, Beta Chain, Quant, S: 413 — ABNORMAL HIGH
hCG, Beta Chain, Quant, S: 722 — ABNORMAL HIGH
hCG, Beta Chain, Quant, S: 785 — ABNORMAL HIGH

## 2011-08-16 ENCOUNTER — Telehealth: Payer: Self-pay | Admitting: Family Medicine

## 2011-08-16 NOTE — Telephone Encounter (Signed)
Patient requesting a referral to Dr. Jeral Fruit for her back pain and pain/numbness in legs and hands. Informed patient that I would send message to MD.

## 2011-08-16 NOTE — Telephone Encounter (Signed)
Need to have a referral for MRI per Dr. Hilda Lias.  Please call patient for this.

## 2011-08-17 NOTE — Telephone Encounter (Signed)
Left message on vm for patient that MD wants her to come in and be seen before she places referral.

## 2011-08-20 ENCOUNTER — Telehealth: Payer: Self-pay | Admitting: Family Medicine

## 2011-08-20 ENCOUNTER — Telehealth: Payer: Self-pay | Admitting: *Deleted

## 2011-08-20 NOTE — Telephone Encounter (Signed)
[facility] [address] has faxed several Rx's for [facility] and the patient is calling to check on when it will be done.  She would like a call when this is done.

## 2011-08-20 NOTE — Telephone Encounter (Signed)
Called the [facility] on [address].  Pt should have refills on her Seroquel.  Dr. Mikel Cella refilled it in August with 6 additional refills.  They stated patient had it refilled at another [facility] but can go ahead and refill prescription..  Pt. Notified.  Kimberly [name]

## 2011-08-20 NOTE — Telephone Encounter (Signed)
Prescription is ready at [facility] on [facility].  Pt. Notified. Kimberly [name]

## 2011-09-19 ENCOUNTER — Encounter: Payer: Medicare Other | Admitting: Family Medicine

## 2011-10-01 ENCOUNTER — Other Ambulatory Visit: Payer: Self-pay | Admitting: Family Medicine

## 2011-10-01 MED ORDER — CITALOPRAM HYDROBROMIDE 40 MG PO TABS: 60.0000 mg | ORAL_TABLET | Freq: Every day | ORAL | Status: DC

## 2011-10-17 ENCOUNTER — Telehealth: Payer: Self-pay | Admitting: Family Medicine

## 2011-10-17 NOTE — Telephone Encounter (Signed)
I have not evaluated her back. Please have her make an appointment with me to further discuss this referral.  The neurologist will need a full office visit with me in order to see her.  Thank you! Evea Sheek M. Loyde Orth, M.D.

## 2011-10-17 NOTE — Telephone Encounter (Signed)
Ms. Catanese says she needs a neurologist for Dr. Jeral Fruit.  She would like to speak to Dr. Mikel Cella.

## 2011-11-15 ENCOUNTER — Telehealth: Payer: Self-pay | Admitting: Family Medicine

## 2011-11-15 NOTE — Telephone Encounter (Signed)
Patient is calling because Dr. Jeral Fruit said she needs a Referral for an MRI.  Please give her a call to discuss this further.

## 2011-11-23 NOTE — Telephone Encounter (Signed)
Unable to reach patient. Please let her know that Dr. Jeral Fruit will need to place referral. I have not evaluated her back and I feel that Dr. Jeral Fruit should order MRI if they need it.  Thank you!

## 2011-11-23 NOTE — Telephone Encounter (Signed)
Has this been addressed?

## 2011-12-18 ENCOUNTER — Ambulatory Visit: Payer: Medicare Other | Admitting: Family Medicine

## 2012-01-03 ENCOUNTER — Other Ambulatory Visit: Payer: Self-pay | Admitting: Family Medicine

## 2012-01-03 NOTE — Telephone Encounter (Signed)
Refill request

## 2012-01-09 ENCOUNTER — Ambulatory Visit: Payer: Medicare Other | Admitting: Family Medicine

## 2012-01-21 ENCOUNTER — Other Ambulatory Visit: Payer: Self-pay | Admitting: Family Medicine

## 2012-02-01 ENCOUNTER — Ambulatory Visit: Payer: Medicare Other | Admitting: Family Medicine

## 2012-02-04 ENCOUNTER — Other Ambulatory Visit: Payer: Self-pay | Admitting: Family Medicine

## 2012-02-06 ENCOUNTER — Encounter: Payer: Self-pay | Admitting: Family Medicine

## 2012-02-11 ENCOUNTER — Ambulatory Visit (INDEPENDENT_AMBULATORY_CARE_PROVIDER_SITE_OTHER): Payer: Medicare Other | Admitting: Family Medicine

## 2012-02-11 ENCOUNTER — Encounter: Payer: Self-pay | Admitting: Family Medicine

## 2012-02-11 VITALS — BP 118/83 | HR 103 | Ht 68.0 in | Wt 271.0 lb

## 2012-02-11 DIAGNOSIS — L723 Sebaceous cyst: Secondary | ICD-10-CM | POA: Insufficient documentation

## 2012-02-11 NOTE — Progress Notes (Signed)
  Subjective:    Patient ID: Kimberly [name], female    DOB: Jul 13, 1971, 41 y.o.   MRN: [id number]  HPI  Several months of underarm bump.  Gets bigger and smaller at times.  No significant enlargement recently.  No redness, fever, pain.  Has had similar cyst removal from underarm in the past.  She is worried about breast health. Review of Systems See HPI    Objective:   Physical ExamGEN: Alert & Oriented, No acute distress Skin:  Right underarm- approx 1-2 cm mobile, cysts in axilla.  No TTP, no redness         Assessment & Plan:

## 2012-02-11 NOTE — Progress Notes (Signed)
Subjective:     Patient ID: Kimberly [name], female   DOB: 09-19-1971, 41 y.o.   MRN: [id number]  HPI  Pea sized mass in right armpit for past 1 month. Grows very little, then "bursts"/disappears sporadically, then reappears after ~1week. There is no associated pain, redness, external excoriation. Of note, Kimberly [name] had a mass excision 13 years ago for a very similar mass that grew to the size of her fist. Pathology at that time showed that it was benign. Mass has been aesthetically unpleasing for Kimberly [name] - she also does not like the sensation when it does 'burst'.   No recent fevers, chills. Had abscess in mouth that was treated with antibiotcs.  Review of Systems     Objective:   Physical Exam Gen: pleasant, well appearing, over nourished HEENT: NCAT, lacking many teeth Ext: superficial mobile mass under R axilla. Non-erythematous. Non-tender. No LAD. Cannot express fluid.    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 Given hx of varying size, not ttp, hx of benign mass, negative ROS, we are less suspicious of malignant or infectious etiology. Will schedule for procedures clinic this Thursday with Dr. Jennette Kettle for possible removal of cyst for aesthetic concerns. Will schedule for screening mammogram given age and to further r/o malig process.

## 2012-02-11 NOTE — Assessment & Plan Note (Signed)
Cyst in right axilla, Feels it is getting bigger, patient would like this removed.  She would also like a screening mammogram.  Will schedule this first, then return for procedures clinic for cyst removal.

## 2012-02-11 NOTE — Patient Instructions (Addendum)
Will schedule mammogram for routine screening  I don't think cyst under your arm has anything to do with your breast  Will schedule you for procedure clinic to have it removed  Epidermal Cyst An epidermal cyst is sometimes called a sebaceous cyst, epidermal inclusion cyst, or infundibular cyst. These cysts usually contain a substance that looks "pasty" or "cheesy" and may have a bad smell. This substance is a protein called keratin. Epidermal cysts are usually found on the face, neck, or trunk. They may also occur in the vaginal area or other parts of the genitalia of both men and women. Epidermal cysts are usually small, painless, slow-growing bumps or lumps that move freely under the skin. It is important not to try to pop them. This may cause an infection and lead to tenderness and swelling. CAUSES   Epidermal cysts may be caused by a deep penetrating injury to the skin or a plugged hair follicle, often associated with acne. SYMPTOMS   Epidermal cysts can become inflamed and cause:  Redness.   Tenderness.   Increased temperature of the skin over the bumps or lumps.   Grayish-white, bad smelling material that drains from the bump or lump.  DIAGNOSIS   Epidermal cysts are easily diagnosed by your caregiver during an exam. Rarely, a tissue sample (biopsy) may be taken to rule out other conditions that may resemble epidermal cysts. TREATMENT    Epidermal cysts often get better and disappear on their own. They are rarely ever cancerous.   If a cyst becomes infected, it may become inflamed and tender. This may require opening and draining the cyst. Treatment with antibiotics may be necessary. When the infection is gone, the cyst may be removed with minor surgery.   Small, inflamed cysts can often be treated with antibiotics or by injecting steroid medicines.   Sometimes, epidermal cysts become large and bothersome. If this happens, surgical removal in your caregiver's office may be  necessary.  HOME CARE INSTRUCTIONS  Only take over-the-counter or prescription medicines as directed by your caregiver.   Take your antibiotics as directed. Finish them even if you start to feel better.  SEEK MEDICAL CARE IF:    Your cyst becomes tender, red, or swollen.   Your condition is not improving or is getting worse.   You have any other questions or concerns.  MAKE SURE YOU:  Understand these instructions.   Will watch your condition.   Will get help right away if you are not doing well or get worse.  Document Released: 09/08/2004 Document Revised: 09/27/2011 Document Reviewed: 04/16/2011 [facility] Patient Information 2012 [place], [place].

## 2012-02-14 ENCOUNTER — Telehealth: Payer: Self-pay | Admitting: *Deleted

## 2012-02-14 DIAGNOSIS — N6009 Solitary cyst of unspecified breast: Secondary | ICD-10-CM

## 2012-02-14 NOTE — Telephone Encounter (Signed)
Orders placed.

## 2012-02-14 NOTE — Telephone Encounter (Signed)
Message left on office voicemail.  Patient has order for screening mammogram.  Will need to change order to bilateral diagnostic mammogram with right breast ultrasound since patient has a sebaceous cyst.  Will route note to Dr. Earnest Bailey and call their office back.  Gaylene Brooks, RN

## 2012-02-18 ENCOUNTER — Other Ambulatory Visit: Payer: Self-pay | Admitting: Family Medicine

## 2012-02-18 NOTE — Telephone Encounter (Signed)
Dr. Mikel Cella in clinic tomorrow.  Gaylene Brooks, RN

## 2012-02-22 ENCOUNTER — Other Ambulatory Visit: Payer: Medicare Other

## 2012-02-22 ENCOUNTER — Encounter: Payer: Medicare Other | Admitting: Family Medicine

## 2012-02-27 ENCOUNTER — Other Ambulatory Visit: Payer: Self-pay | Admitting: Family Medicine

## 2012-02-27 NOTE — Telephone Encounter (Signed)
This patient has had MULTIPLE no shows. She either needs to be seen by me or mental health soon. Note to pharmacy left as well.

## 2012-02-27 NOTE — Telephone Encounter (Signed)
Patient notified that she must schedule appointment  before further refills after this current one.

## 2012-02-28 ENCOUNTER — Ambulatory Visit (INDEPENDENT_AMBULATORY_CARE_PROVIDER_SITE_OTHER): Payer: Medicare Other | Admitting: Family Medicine

## 2012-02-28 VITALS — BP 134/88 | HR 88 | Temp 98.1°F | Ht 69.0 in | Wt 270.0 lb

## 2012-02-28 DIAGNOSIS — L723 Sebaceous cyst: Secondary | ICD-10-CM

## 2012-02-28 NOTE — Progress Notes (Signed)
Patient ID: Kimberly [name], female   DOB: Aug 31, 1971, 41 y.o.   MRN: [id number] Here for removal sebaceous cyst under right axilla. Has been swelling intermittently Denies fevers. RIGHT AXILLA small 1 cm mass, mildly fluctuant. No erythema, no induration. Patient given informed consent, signed copy in the chart. Appropriate time out taken. Area prepped and draped in usual sterile fashion, 5 cc of 2% lidocaine with epi used for local anasthesia. Midline 1 cm incision made. Cyst was already ruptured and some typical sebaceous fluid was  seen. The ac and remaining cyst were removed. Three interrupted sutures used for closure using 4-0 nylon.

## 2012-02-28 NOTE — Patient Instructions (Signed)
F/u mid week next week suture removal

## 2012-03-06 ENCOUNTER — Ambulatory Visit (INDEPENDENT_AMBULATORY_CARE_PROVIDER_SITE_OTHER): Payer: Medicare Other | Admitting: *Deleted

## 2012-03-06 DIAGNOSIS — Z4802 Encounter for removal of sutures: Secondary | ICD-10-CM

## 2012-03-06 NOTE — Progress Notes (Signed)
Patient in for suture removal.  Wound appears to have healed well . There is a hard lump at site of cyst removal just under sutures. Dr. Deirdre Priest came in to look at area and advises OK to remove sutures . Appears to be scar tissue  build up he states. Three sutures removed without difficulty. Edges of wound are not approximated. Slight gapping of wound. Dr. Deirdre Priest notified.  Area cleaned with alcohol and antibiotic ointment applied.  Wound care instruction verbally given and advised to call if any problems.

## 2012-03-31 ENCOUNTER — Other Ambulatory Visit: Payer: Self-pay | Admitting: Family Medicine

## 2012-04-01 ENCOUNTER — Other Ambulatory Visit: Payer: Self-pay | Admitting: Family Medicine

## 2012-04-04 ENCOUNTER — Encounter: Payer: Self-pay | Admitting: Family Medicine

## 2012-04-04 ENCOUNTER — Ambulatory Visit (INDEPENDENT_AMBULATORY_CARE_PROVIDER_SITE_OTHER): Payer: Medicare Other | Admitting: Family Medicine

## 2012-04-04 VITALS — BP 114/75 | HR 100 | Temp 98.5°F | Ht 68.0 in | Wt 267.0 lb

## 2012-04-04 DIAGNOSIS — Z79899 Other long term (current) drug therapy: Secondary | ICD-10-CM

## 2012-04-04 DIAGNOSIS — M545 Low back pain, unspecified: Secondary | ICD-10-CM

## 2012-04-04 DIAGNOSIS — M549 Dorsalgia, unspecified: Secondary | ICD-10-CM

## 2012-04-04 DIAGNOSIS — D509 Iron deficiency anemia, unspecified: Secondary | ICD-10-CM

## 2012-04-04 DIAGNOSIS — R69 Illness, unspecified: Secondary | ICD-10-CM

## 2012-04-04 DIAGNOSIS — F3160 Bipolar disorder, current episode mixed, unspecified: Secondary | ICD-10-CM

## 2012-04-04 LAB — CBC
HCT: 36 % (ref 36.0–46.0)
Hemoglobin: 11.4 g/dL — ABNORMAL LOW (ref 12.0–15.0)
MCH: 21.8 pg — ABNORMAL LOW (ref 26.0–34.0)
MCHC: 31.7 g/dL (ref 30.0–36.0)
MCV: 69 fL — ABNORMAL LOW (ref 78.0–100.0)
Platelets: 342 10*3/uL (ref 150–400)
RBC: 5.22 MIL/uL — ABNORMAL HIGH (ref 3.87–5.11)
RDW: 18.2 % — ABNORMAL HIGH (ref 11.5–15.5)
WBC: 12.4 10*3/uL — ABNORMAL HIGH (ref 4.0–10.5)

## 2012-04-04 LAB — COMPREHENSIVE METABOLIC PANEL
ALT: 8 U/L (ref 0–35)
AST: 13 U/L (ref 0–37)
Albumin: 4.5 g/dL (ref 3.5–5.2)
Alkaline Phosphatase: 75 U/L (ref 39–117)
BUN: 10 mg/dL (ref 6–23)
CO2: 23 mEq/L (ref 19–32)
Calcium: 9.5 mg/dL (ref 8.4–10.5)
Chloride: 104 mEq/L (ref 96–112)
Creat: 1.27 mg/dL — ABNORMAL HIGH (ref 0.50–1.10)
Glucose, Bld: 77 mg/dL (ref 70–99)
Potassium: 3.2 mEq/L — ABNORMAL LOW (ref 3.5–5.3)
Sodium: 138 mEq/L (ref 135–145)
Total Bilirubin: 0.4 mg/dL (ref 0.3–1.2)
Total Protein: 7.4 g/dL (ref 6.0–8.3)

## 2012-04-04 LAB — LDL CHOLESTEROL, DIRECT: Direct LDL: 181 mg/dL — ABNORMAL HIGH

## 2012-04-04 LAB — POCT GLYCOSYLATED HEMOGLOBIN (HGB A1C): Hemoglobin A1C: 5.4

## 2012-04-04 NOTE — Patient Instructions (Addendum)
It was good to see you today! I really feel like it is important for you to see me frequently so we can keep up to date with your medical problems. Please make an appointment to come back to see me in 6-8 weeks.   I will put in the referral for your MRI. Please keep this appointment.   Also, I would really like for you to see a mental health provider concerning your mood as well as your medications. Sometimes medications can cause side effects like what you are describing and I would like for them to look over your medications and make adjustments as needed.  Let me know if you need anything! See you soon! Rachele Lamaster M. Aleatha Taite, M.D.

## 2012-04-06 ENCOUNTER — Encounter: Payer: Self-pay | Admitting: Family Medicine

## 2012-04-06 NOTE — Progress Notes (Signed)
Subjective:     Patient ID: Kimberly [name], female   DOB: Jan 23, 1971, 41 y.o.   MRN: [id number]  HPI 41 yo F presenting for follow up of chronic medical problems.   1. Bipolar- Patient has known Bipolar disorder. She is on Seroquel (which is prescribed BID, but she takes full dose at night because she wants to be alert during the day.) She states she has difficult time sleeping at night and takes "many sleeping pills."  She states she has racing thoughts and is not able to sleep. She denies any impulsive behavior. She was crying when I entered the exam room but did not cry again. She denies SI/HI. Patient also states that she has been having involuntary movements of her legs recently. She decribes it as her legs "move" and cause her to fall or trip. She also experiences this at night while she is trying to sleep. She also states she feels like she is thinking slower and her memory is getting worse. She has been on Seroquel for a long time. She is not followed by mental health. She was seen at the [facility] in the past which she liked, but she has not been recently. Patient also has a recurrent trend of missing appointments at [facility] so we have not been able to follow her as closely as I would like. I have encouraged patient she needs to keep appointments so we can adequately address all of her concerns.  2. Back pain- Patient had fall at age 72 yo from a hay loft which caused her a back injury. Since then, she continues to have back pain, which she reports has been getting progressively worse. She endorses numbness and involuntary movements of her lower extremities (see above.) She has not had any trauma since her fall 30 years ago. She has been trying to be seen by neurosurgery. She states Dr. Jeral Fruit is requesting a lumbar MRI prior to arranging an appointment. Patient states she hopes to get some answers about her pain and to see if there is anything he can do to make her feel better. Her pain and  limited ROM does affect her daily life. It makes it more difficult to buy groceries and take care of her children. She states it bothers her so much she has looked into getting a power wheelchair. She has not tried anything for the pain. Nothing makes it better other than rest. Walking/movement makes it worse. She denies fevers, chills, deformity, red joints, or bowel/bladder incontinence.   3. Health Maintenance- Patient was scheduled for a CPE today. She declines pelvic exam and pap smear. She is due for pap and we will do this soon. She has not had any recent lab work. Given her obesity, age and chronic medication use (including Seroquel) will order CBC, Cmet, A1c and direct LDL today. Patient also needs to be seen by Rosalita Chessman for her yearly health wellness exam.  Review of Systems See HPI above. Also, patient endorses tooth pain but no fevers or chills.    Objective:   Physical Exam  Constitutional: She is oriented to person, place, and time. She appears well-developed and well-nourished. No distress.       Patient visibly crying and upset when I entered exam room. She states she was thinking about her mother and make her sad. She was easily distracted and did not cry again.   HENT:  Head: Normocephalic and atraumatic.       Poor dentition. Multiple caries and cracked or  missing teeth  Neck: Normal range of motion.  Cardiovascular: Normal rate and regular rhythm.   Pulmonary/Chest: Effort normal and breath sounds normal.  Abdominal: Soft.       Obese  Musculoskeletal:       Mild tenderness to palpation of lumbar spine, ?muscular vs. Bony pain. Limited ROM bending forward and side to side secondary to pain. She can twist some and is able to bend backwards without much pain. No lower extremity weakness appreciated. Does endorse sensory deficits, although difficult to pinpoint location  Lymphadenopathy:    She has no cervical adenopathy.  Neurological: She is alert and oriented to person,  place, and time.  Skin: Skin is warm and dry. No rash noted.  Psychiatric:       Interrupted multiple times, but no evidence of pressured speech. She does have some psychomotor retardation. Difficult to keep eyes open, slowed speech. No twitches or involuntary movements observed. Speech is coherent.      Assessment:  41yo female with bipolar disorder, back pain, anemia presenting for routine follow up appointment  Plan:

## 2012-04-06 NOTE — Assessment & Plan Note (Addendum)
Patient reporting racing thoughts and difficulty sleeping. She is on Seroquel and Celexa. She states she is compliant with her medications. She is also taking extra sleep pills at night. She reports extrapyramidal side effects, although these are not observed in clinic today other than mild psychomotor retardation in speech. Patient has long history of noncompliance. She states understanding and says she will keep appointments and come regularly. I also told patient she would be better served by a mental health professional who could manage her medications as well as the side effects from the medications. Patient agrees. Will start with mood disorder clinic at [facility]. I have given patient Dr. Carola Rhine phone number and reminded her multiple times to call Dr. Pascal Lux on Monday to make an appointment. She will follow up with me in 1 month. At that time, she should have an appointment made with Dr. Pascal Lux. If patient is having SI/HI, worsening of her symptoms or any other concerns about her mental health, she should seek care immediately.  Will get Cmet and A1c today since she has been on long term psychiatric medications.
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
## 2012-04-06 NOTE — Assessment & Plan Note (Signed)
Patient with known anemia. Will check CBC since she has not had blood work in a long time. This will also be part of her yearly exam.

## 2012-04-08 ENCOUNTER — Telehealth: Payer: Self-pay | Admitting: Psychology

## 2012-04-08 NOTE — Telephone Encounter (Signed)
Patient left VM.  Called per Dr. Algis Downs recommendation to schedule an appointment in [facility].  Reviewed notes including conversations I have had with patient in the past.  She is not a good match for the limited availability we have in [facility].  She has been seen in the past at [facility].  If for some reason she can not continue to be seen there, would recommend [facility]:  [phone].  Attempted to call patient back.  Got VM and mailbox was full so could not leave a message.  Will try again tomorrow.

## 2012-04-09 NOTE — Telephone Encounter (Signed)
Called her again today and spoke to her.  She says she can't go back to [facility] because her insurance changed.  Gave her [facility]'s number.  Not sure if they take her health insurance.  Told her to call back if she runs into trouble.  May need to get Kimberly [name] (Child psychotherapist) to help her figure out where she can be seen on a REGULAR basis.  I suspect her level of needed care is long-term.  If we could get her in-home psych care, that would be even better.

## 2012-04-10 ENCOUNTER — Telehealth: Payer: Self-pay | Admitting: Family Medicine

## 2012-04-10 NOTE — Telephone Encounter (Signed)
Spoke with [employer] concerning prior authorization for MRI.  Since Kimberly [name] has worsening pain while taking OTC antiinflammatories, she will be able to have her MRI. Number is [phone]  [employer]. [name], M.D. 04/10/2012 4:15 PM

## 2012-04-12 ENCOUNTER — Inpatient Hospital Stay: Admission: RE | Admit: 2012-04-12 | Payer: Medicare Other | Source: Ambulatory Visit

## 2012-04-30 ENCOUNTER — Other Ambulatory Visit: Payer: Self-pay | Admitting: Family Medicine

## 2012-05-13 ENCOUNTER — Other Ambulatory Visit: Payer: Self-pay | Admitting: *Deleted

## 2012-05-13 DIAGNOSIS — F3160 Bipolar disorder, current episode mixed, unspecified: Secondary | ICD-10-CM

## 2012-05-13 MED ORDER — CITALOPRAM HYDROBROMIDE 40 MG PO TABS: ORAL_TABLET | ORAL | Status: DC

## 2012-05-13 NOTE — Telephone Encounter (Signed)
Patient called office requesting refill of Celexa because she has been out of med x 1 week.  States pharmacy has been requesting refill, but has not heard back from our office.  Patient tearful, yelling, and states "I'm about to lose it.  I need my medicine."  Informed patient that I will refill med and she is due for her follow-up appt next month with Dr. Mikel Cella.  Patient requesting to change to another PCP because "me and and Dr. Mikel Cella don't get along."  Will route note to Dr. Mikel Cella and Spero Geralds and follow-up with patient.  Gaylene Brooks, RN

## 2012-05-13 NOTE — Telephone Encounter (Signed)
I received refill requests on 6/10, 6/11 and 7/10 and responded to all requests with requested refills. They were sent to pharmacy, as far as I can tell. Will discuss PCP change with Dr. Pascal Lux. Thank you for your help with this patient.

## 2012-05-15 NOTE — Telephone Encounter (Signed)
I have attempted to call Ms. Manson Passey. Home number was diconnected, and cell phone voicemail recording did not sound like her voice therefore i did not leave a message. I will attempt calling again at a different time. Cicero Noy M. Yvana Samonte, M.D.

## 2012-05-26 ENCOUNTER — Telehealth: Payer: Self-pay | Admitting: Family Medicine

## 2012-05-26 NOTE — Telephone Encounter (Signed)
Called patient and left a message on voicemail that she would need to call back and schedule an appointment to be seen for this problem before a referral can be made.

## 2012-05-26 NOTE — Telephone Encounter (Signed)
Patient is calling because she had a in office procedure done about 3 months ago and she thinks that something is wrong because she can hardly lift her arm.  She wants to be referred out to someone to get this corrected.

## 2012-05-27 ENCOUNTER — Encounter: Payer: Self-pay | Admitting: Family Medicine

## 2012-05-27 ENCOUNTER — Ambulatory Visit (INDEPENDENT_AMBULATORY_CARE_PROVIDER_SITE_OTHER): Payer: Medicare Other | Admitting: Family Medicine

## 2012-05-27 ENCOUNTER — Ambulatory Visit: Payer: Medicare Other | Admitting: Family Medicine

## 2012-05-27 VITALS — BP 113/81 | HR 99 | Ht 68.0 in | Wt 252.0 lb

## 2012-05-27 DIAGNOSIS — L723 Sebaceous cyst: Secondary | ICD-10-CM

## 2012-05-27 MED ORDER — DOXYCYCLINE HYCLATE 100 MG PO TABS: 100.0000 mg | ORAL_TABLET | Freq: Two times a day (BID) | ORAL | Status: AC

## 2012-05-27 NOTE — Patient Instructions (Addendum)
Antibiotic called docycyline  Use warm compresses  Call earlier if you notice signs of pain or infection  Come back for recheck on Friday

## 2012-05-27 NOTE — Progress Notes (Signed)
  Subjective:    Patient ID: Kimberly [name], female    DOB: 02-Jan-1971, 41 y.o.   MRN: [id number]  HPI  Work-in for several days of right axillary pain.  sebaceuous cysts removed in May from same axilla.  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  States most recent several days ago.  States she needs to be admitted to cut them all out.  Denies fever, drainage, skin redness.  Review of Systemssee HPI     Objective:   Physical Exam  GEN: NAD, obese Skin:  Right axilla: difficult to examine as patient guarding.  5mm tender nodule mid axilla noted, without erythema or fluctuance.     Assessment & Plan:

## 2012-05-27 NOTE — Assessment & Plan Note (Addendum)
Recurrence of previous sebaceous cyst vs new.  Cyst small, pain out of proportion to exam, does not appear to be fluctuant, no surrounding cellulitis.  Will rx docycyline for 1 week.  If continues to bother her, will return for excision.

## 2012-05-30 ENCOUNTER — Encounter: Payer: Self-pay | Admitting: Family Medicine

## 2012-05-30 ENCOUNTER — Ambulatory Visit (INDEPENDENT_AMBULATORY_CARE_PROVIDER_SITE_OTHER): Payer: Medicare Other | Admitting: Family Medicine

## 2012-05-30 VITALS — BP 130/84 | HR 111 | Temp 99.0°F | Ht 68.0 in | Wt 255.0 lb

## 2012-05-30 DIAGNOSIS — L723 Sebaceous cyst: Secondary | ICD-10-CM

## 2012-05-30 MED ORDER — TRAMADOL HCL 50 MG PO TABS: 50.0000 mg | ORAL_TABLET | Freq: Two times a day (BID) | ORAL | Status: AC

## 2012-05-30 NOTE — Progress Notes (Signed)
Subjective:     Patient ID: Kimberly [name], female   DOB: [birth year], 42 y.o.   MRN: [id number]  HPI 40 yo F presents with husband for evaluation of R axillary pain. She was seen 3 days ago for this pain and started on doxycyline to cover for possible cellulitis. She reports pain x 2 weeks. She had a R axially cyst removed in 02/2012. Lately she has severe, sharp pains with movement. She also has pain on the posterior aspect of her arm. She denies fever, swelling. She feels that the area is warm. She has tried motrin and some of her husbands tramadol w/o relief.   Review of Systems As per HPI     Objective:   Physical Exam BP 130/84  Pulse 111  Temp 99 F (37.2 C) (Oral)  Ht 5\' 8"  (1.727 m)  Wt 255 lb (115.667 kg)  BMI 38.77 kg/m2 General appearance: alert, cooperative, no distress and moderate distress during exam.  Neck: no adenopathy Lymph nodes: Axillary adenopathy: none.  R axillary: no erythema or fluctuance. 1x 2 cm of induration in inferior/anterior axilla. No streaking. No rash.  EXT: decreased ROM R shoulder and  Decreased grip strength in R hand appears due to pain. Full ROM and grip strength L side.     Assessment and Plan:

## 2012-05-30 NOTE — Patient Instructions (Addendum)
Mrs. Kimberly [name],   Thank you for coming back to see me today. For your under arm pain: 1. Tramadol 50 mg up to twice daily 2. Finish doxycycline 3. Warm compresses 4. I know it hurts be do you best to lift your arm up the arm and work on gripping. I do not want your muscles to get weak.    I am referring you general surgery for evaluation of this possible reformation of cyst under your arm. Please f/u with Dr. Mikel Cella   Dr. Armen Pickup

## 2012-05-30 NOTE — Assessment & Plan Note (Signed)
A: patient with recurrence of pain under R axilla. Pain out of proportion with exam findings. ? Recurrence of cyst vs. Normal/expected induration s/p cyst removal 3 months ago.  P: -continue doxycycline to complete course although there is no evidence of cellulitis.  -tramadol prn pain, heat, ROM exercises demonstrated.  -referral to gen surg, on the chance that this is a recurrent cyst

## 2012-06-02 ENCOUNTER — Telehealth (INDEPENDENT_AMBULATORY_CARE_PROVIDER_SITE_OTHER): Payer: Self-pay | Admitting: General Surgery

## 2012-06-02 NOTE — Telephone Encounter (Signed)
Pt calling for advice:  Has appt 06/13/12 for eval of cyst under her arm.  Today it is swollen and extremely painful.  Pt is softly crying.  Advised her to go to ER for possible drainage of the cyst and pain control.  Will keep her appt for next week for now, too.

## 2012-06-04 ENCOUNTER — Other Ambulatory Visit: Payer: Self-pay | Admitting: Family Medicine

## 2012-06-13 ENCOUNTER — Ambulatory Visit (INDEPENDENT_AMBULATORY_CARE_PROVIDER_SITE_OTHER): Payer: Medicare Other | Admitting: Surgery

## 2012-06-24 ENCOUNTER — Other Ambulatory Visit: Payer: Self-pay | Admitting: Family Medicine

## 2012-06-24 DIAGNOSIS — F3160 Bipolar disorder, current episode mixed, unspecified: Secondary | ICD-10-CM

## 2012-06-24 MED ORDER — LAMOTRIGINE 200 MG PO TABS: 200.0000 mg | ORAL_TABLET | Freq: Two times a day (BID) | ORAL | Status: DC

## 2012-06-24 MED ORDER — CITALOPRAM HYDROBROMIDE 40 MG PO TABS: ORAL_TABLET | ORAL | Status: DC

## 2012-06-24 MED ORDER — QUETIAPINE FUMARATE 200 MG PO TABS: 200.0000 mg | ORAL_TABLET | Freq: Two times a day (BID) | ORAL | Status: DC

## 2012-06-24 NOTE — Telephone Encounter (Signed)
Returned call to patient.  Informed that Dr. Mikel Cella attempted to call patient about her medications in July.  Dr. Mikel Cella in clinic this afternoon and will route refill request for citalopram, Seroquel, and Lamotrigene.  Will call patient back later today.  Gaylene Brooks, RN

## 2012-06-24 NOTE — Telephone Encounter (Signed)
Called patient and informed meds refilled to her pharmacy per Dr. Mikel Cella.  Patient will verify that pharmacy has correct PCP info on file for future refills.  Patient will call back in November to schedule her follow-up appt.  Patient states [facility] no longer accepts her insurance and her caseworker no longer works there.  Will check with Dr. Mikel Cella for other mental health provider options and call patient back.    Gaylene Brooks, RN
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
## 2012-06-24 NOTE — Telephone Encounter (Signed)
I have refilled these medications at [name]. She will need an office visit in 3 months. (2 of the other visits were for same day complaints, not for follow-up for chronic problems.) Patient will also greatly benefit from a mental health provider, perhaps [facility] where she was seen before?  Of note, I have not received any refill requests on Epic OR in my [facility] mailbox for these medications. Perhaps they are being sent to another provider or she has not asked the pharmacist. Either way, these requests have not been sent to me.  Thank you. Nekoda Chock M. Dayrin Stallone, M.D.

## 2012-06-24 NOTE — Telephone Encounter (Signed)
[facility] would be her next option. Dr. Pascal Lux has looked over her chart in the past and feels like a different center would be best. Thank you! Kimberly [name] M. Kimberly [name], M.D.

## 2012-06-25 NOTE — Telephone Encounter (Signed)
Will have patient call [facility] at [phone].  Address is [address]  Returned call to patient and left message to call our office back.  Gaylene Brooks, RN

## 2012-06-26 ENCOUNTER — Ambulatory Visit (INDEPENDENT_AMBULATORY_CARE_PROVIDER_SITE_OTHER): Payer: Medicare Other | Admitting: Surgery

## 2012-06-26 ENCOUNTER — Encounter (INDEPENDENT_AMBULATORY_CARE_PROVIDER_SITE_OTHER): Payer: Self-pay | Admitting: Surgery

## 2012-06-26 VITALS — BP 126/80 | HR 88 | Temp 98.6°F | Resp 18 | Ht 69.0 in | Wt 251.6 lb

## 2012-06-26 DIAGNOSIS — L732 Hidradenitis suppurativa: Secondary | ICD-10-CM

## 2012-06-26 NOTE — Progress Notes (Signed)
Patient ID: Kimberly [name], female   DOB: 26-Jun-1971, 41 y.o.   MRN: [id number]  Chief Complaint  Patient presents with  . Other    axillary cyst    HPI Kimberly [name] is a 41 y.o. female.   HPI She is referred by Dr.Chambliss for evaluation of a chronic infection under her right axilla. She reports she has chronic drainage from the axilla. 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 She has also had incision and drainage is performed. She has been on antibiotics without improvement. She has never had any other areas in her other axilla or groins. She reports moderate to severe right axillary pain Past Medical History  Diagnosis Date  . Anemia   . Arthritis     History reviewed. No pertinent past surgical history.bipolar disorder, obesity, tobacco abuse  History reviewed. No pertinent family history.  Social History History  Substance Use Topics  . Smoking status: Current Everyday Smoker -- 0.5 packs/day    Types: Cigarettes  . Smokeless tobacco: Not on file  . Alcohol Use: No    No Known Allergies  Current Outpatient Prescriptions  Medication Sig Dispense Refill  . ammonium lactate (LAC-HYDRIN) 12 % lotion Apply topically as needed for dry skin.  400 g  0  . citalopram (CELEXA) 40 MG tablet Take 1 and 1/2 tab by mouth daily.  60 tablet  5  . doxycycline (VIBRA-TABS) 100 MG tablet       . ferrous gluconate (FERGON) 325 MG tablet Take 325 mg by mouth 2 (two) times daily.        Marland Kitchen lamoTRIgine (LAMICTAL) 200 MG tablet Take 1 tablet (200 mg total) by mouth 2 (two) times daily.  60 tablet  5  . QUEtiapine (SEROQUEL) 200 MG tablet Take 1 tablet (200 mg total) by mouth 2 (two) times daily.  60 tablet  5  . Sod Fluoride-Potassium Nitrate (PREVIDENT 5000 SENSITIVE) 1.1-5 % PSTE Use as toothpaste.       . traMADol (ULTRAM) 50 MG tablet         Review of Systems Review of Systems  Constitutional: Negative for fever, chills and unexpected weight  change.  HENT: Negative for hearing loss, congestion, sore throat, trouble swallowing and voice change.   Eyes: Negative for visual disturbance.  Respiratory: Negative for cough and wheezing.   Cardiovascular: Negative for chest pain, palpitations and leg swelling.  Gastrointestinal: Negative for nausea, vomiting, abdominal pain, diarrhea, constipation, blood in stool, abdominal distention and anal bleeding.  Genitourinary: Negative for hematuria, vaginal bleeding and difficulty urinating.  Musculoskeletal: Negative for arthralgias.  Skin: Negative for rash and wound.  Neurological: Negative for seizures, syncope and headaches.  Hematological: Negative for adenopathy. Does not bruise/bleed easily.  Psychiatric/Behavioral: Negative for confusion.    Blood pressure 126/80, pulse 88, temperature 98.6 F (37 C), temperature source Temporal, resp. rate 18, height 5\' 9"  (1.753 m), weight 251 lb 9.6 oz (114.125 kg).  Physical Exam Physical Exam  Constitutional: She is oriented to person, place, and time. She appears well-developed and well-nourished. No distress.  HENT:  Head: Normocephalic and atraumatic.  Right Ear: External ear normal.  Left Ear: External ear normal.  Nose: Nose normal.  Mouth/Throat: Oropharynx is clear and moist. No oropharyngeal exudate.       Poor dentition  Eyes: Conjunctivae are normal. Pupils are equal, round, and reactive to light. Right eye exhibits no discharge. Left eye exhibits no discharge. No scleral icterus.  Neck: Normal range of motion. Neck supple. No tracheal deviation present.  Cardiovascular: Normal rate, regular rhythm, normal heart sounds and intact distal pulses.   No murmur heard. Pulmonary/Chest: Effort normal and breath sounds normal. No respiratory distress. She has no wheezes. She has no rales.  Abdominal: Soft. Bowel sounds are normal. She exhibits no distension. There is no tenderness. There is no rebound.  Musculoskeletal: Normal range of  motion. She exhibits no edema and no tenderness.  Lymphadenopathy:    She has no cervical adenopathy.  Neurological: She is alert and oriented to person, place, and time.  Skin: Skin is warm and dry. No rash noted. She is not diaphoretic. No erythema.       There are chronic skin changes in the right axilla but no erythema. The area is moderately tender  Psychiatric: Her behavior is normal. Judgment normal.    Data Reviewed   Assessment    Chronic right axillary hidradenitis    Plan    I believe wide excision of the hidradenitis in the right axilla is warranted. I discussed the diagnosis with the patient and her husband. I explained to them that this is not a curable disease but we would try local control. I discussed the risk of surgery which includes but is not limited to bleeding, infection, recurrence, having a chronic open wound, need for further surgery, etc. I will keep her on doxycycline preop. Surgery will be scheduled. Likelihood of success is moderate.       [name] A 06/26/2012, 1:52 PM

## 2012-07-03 ENCOUNTER — Encounter (HOSPITAL_COMMUNITY): Payer: Self-pay | Admitting: Pharmacy Technician

## 2012-07-05 ENCOUNTER — Emergency Department (HOSPITAL_COMMUNITY): Payer: Medicare Other

## 2012-07-05 ENCOUNTER — Emergency Department (HOSPITAL_COMMUNITY)
Admission: EM | Admit: 2012-07-05 | Discharge: 2012-07-05 | Disposition: A | Discharge status: Home / Self Care | Payer: Medicare Other | Attending: Emergency Medicine | Admitting: Emergency Medicine

## 2012-07-05 ENCOUNTER — Encounter (HOSPITAL_COMMUNITY): Payer: Self-pay | Admitting: Emergency Medicine

## 2012-07-05 DIAGNOSIS — F17200 Nicotine dependence, unspecified, uncomplicated: Secondary | ICD-10-CM | POA: Insufficient documentation

## 2012-07-05 DIAGNOSIS — F209 Schizophrenia, unspecified: Secondary | ICD-10-CM | POA: Insufficient documentation

## 2012-07-05 DIAGNOSIS — S82853A Displaced trimalleolar fracture of unspecified lower leg, initial encounter for closed fracture: Secondary | ICD-10-CM | POA: Insufficient documentation

## 2012-07-05 DIAGNOSIS — F319 Bipolar disorder, unspecified: Secondary | ICD-10-CM | POA: Insufficient documentation

## 2012-07-05 DIAGNOSIS — Y92009 Unspecified place in unspecified non-institutional (private) residence as the place of occurrence of the external cause: Secondary | ICD-10-CM | POA: Insufficient documentation

## 2012-07-05 DIAGNOSIS — W010XXA Fall on same level from slipping, tripping and stumbling without subsequent striking against object, initial encounter: Secondary | ICD-10-CM | POA: Insufficient documentation

## 2012-07-05 DIAGNOSIS — M129 Arthropathy, unspecified: Secondary | ICD-10-CM | POA: Insufficient documentation

## 2012-07-05 HISTORY — DX: Bipolar disorder, unspecified: F31.9

## 2012-07-05 MED ORDER — OXYCODONE-ACETAMINOPHEN 5-325 MG PO TABS: 1.0000 | ORAL_TABLET | ORAL | Status: AC | PRN

## 2012-07-05 MED ORDER — OXYCODONE-ACETAMINOPHEN 5-325 MG PO TABS
1.0000 | ORAL_TABLET | Freq: Once | ORAL | Status: AC
  Administered 2012-07-05: 1 via ORAL
  Filled 2012-07-05: qty 1

## 2012-07-05 MED ORDER — FENTANYL CITRATE 0.05 MG/ML IJ SOLN
50.0000 ug | Freq: Once | INTRAMUSCULAR | Status: AC
  Administered 2012-07-05: 50 ug via INTRAMUSCULAR
  Filled 2012-07-05: qty 2

## 2012-07-05 NOTE — ED Provider Notes (Signed)
History     CSN: [id number]  Arrival date & time 07/05/12  3875   First MD Initiated Contact with Patient 07/05/12 [phone]      Chief Complaint  Patient presents with  . Fall  . Foot Pain    (Consider location/radiation/quality/duration/timing/severity/associated sxs/prior treatment) HPI Hx from pt. Kimberly [name] is a 41 y.o. female with hx bipolar and schizophrenia who presents with c/o R ankle pain s/p fall yesterday. States she was playing with her son in the living room yesterday afternoon when she fell injuring her ankle. Pain is sharp, severe, and located primarily around the lateral malleolus. Reports she is unable to wt bear due to the severity of the pain. She has difficulty moving the ankle. She has taken hydrocodone at home and soaked the foot in epsom salt with some relief of her pain. She states that her foot feels "funny" but is not numb. She denies pain elsewhere. No previous hx injury to the ankle.  Past Medical History  Diagnosis Date  . Anemia   . Arthritis   . Schizophrenia   . Bipolar 1 disorder     No past surgical history on file.  No family history on file.  History  Substance Use Topics  . Smoking status: Current Every Day Smoker -- 0.5 packs/day    Types: Cigarettes  . Smokeless tobacco: Not on file  . Alcohol Use: No    OB History    Grav Para Term Preterm Abortions TAB SAB Ect Mult Living                  Review of Systems as per HPI  Allergies  Review of patient's allergies indicates no known allergies.  Home Medications   Current Outpatient Rx  Name Route Sig Dispense Refill  . ACETAMINOPHEN 500 MG PO TABS Oral Take 1,000 mg by mouth every 6 (six) hours as needed. Pain    . CITALOPRAM HYDROBROMIDE 40 MG PO TABS Oral Take 20-40 mg by mouth 2 (two) times daily. Takes 1 tablet in the morning and 1/2 tablet at night    . DOXYCYCLINE HYCLATE 100 MG PO TABS Oral Take 100 mg by mouth 2 (two) times daily.     . IBUPROFEN 200 MG PO TABS  Oral Take 400 mg by mouth every 6 (six) hours as needed. Pain    . LAMOTRIGINE 200 MG PO TABS Oral Take 1 tablet (200 mg total) by mouth 2 (two) times daily. 60 tablet 5  . QUETIAPINE FUMARATE 200 MG PO TABS Oral Take 1 tablet (200 mg total) by mouth 2 (two) times daily. 60 tablet 5    BP 141/71  Pulse 106  Temp 99.2 F (37.3 C) (Oral)  Resp 20  SpO2 98%  Physical Exam  Nursing note and vitals reviewed. Constitutional: She appears well-developed and well-nourished. No distress.  HENT:  Head: Normocephalic and atraumatic.  Neck: Normal range of motion.  Cardiovascular: Normal rate.   Pulmonary/Chest: Effort normal.  Musculoskeletal: Normal range of motion.       R ankle: swelling located primarily to the lateral malleolus. TTP to the same. There is no focal ttp around the 5th metatarsal head. Minimal ttp at medial malleolus. Limited ROM at ankle. Able to wiggle toes. Thompson's test nl. NVI with sensory intact to lt touch; DP/PT pulses 2+. Compartments supple  Neurological: She is alert.  Skin: Skin is warm and dry. She is not diaphoretic.  Psychiatric: She has a normal mood and affect.  ED Course  Procedures (including critical care time)  Labs Reviewed - No data to display Dg Ankle Complete Right  07/05/2012  *RADIOLOGY REPORT*  Clinical Data: 41 year old female status post fall with pain.  RIGHT ANKLE - COMPLETE 3+ VIEW  Comparison: None.  Findings: Spiral fracture of the distal right fibula with overlying soft tissue swelling.  Avulsion type fracture fragments at the medial malleolus.  On the lateral view there is evidence of a minimally-displaced posterior malleolus fracture.  Mild lateral subluxation of the mortise joint.  Talar dome remains intact.  Calcaneus intact.  IMPRESSION: Trimalleolar fracture with mild lateral subluxation of the mortise joint.   Original Report Authenticated By: Harley Hallmark, M.D.    Dg Foot Complete Right  07/05/2012  *RADIOLOGY REPORT*   Clinical Data: Larey Seat and injured right foot and ankle.  RIGHT FOOT COMPLETE - 3+ VIEW  Comparison: Right ankle x-rays obtained concurrently.  Findings: Medial malleolar fracture as noted on the ankle imaging. No fractures identified involving the bones of the foot.  Well- preserved joint spaces.  Well-preserved bone mineral density.  Mild hallux valgus.  IMPRESSION: No acute or significant osseous abnormality involving the bones of the foot.   Original Report Authenticated By: Arnell Sieving, M.D.      1. Trimalleolar fracture of ankle, closed       MDM  Pt with pain primarily to lateral malleolus after a fall at home yesterday. Xrays reviewed by me, indicate minimally displaced trimal fx. She is NVI and without evidence of compartment syndrome. Pt will be placed in posterior splint and on crutches, instructed to stay NWB, to f/u with ortho on Mon. Rx for percocet. Reasons to return to ED discussed. Pt and husband at bedside verbalized understanding, agreeable with plan.       Grant Fontana, [name] 07/05/12 [phone]

## 2012-07-05 NOTE — ED Notes (Signed)
Ortho Tech at bedside.  

## 2012-07-05 NOTE — ED Provider Notes (Signed)
Medical screening examination/treatment/procedure(s) were performed by non-physician practitioner and as supervising physician I was immediately available for consultation/collaboration.  Non displaced trimalleolar fx..    immobilization, pain management, refer to orthopedics  Donnetta Hutching, MD 07/05/12 [phone]

## 2012-07-05 NOTE — ED Notes (Signed)
Ortho Tech paged.

## 2012-07-05 NOTE — ED Notes (Signed)
NWG:NF62<ZH> Expected date:07/05/12<BR> Expected time: 8:05 AM<BR> Means of arrival:Ambulance<BR> Comments:<BR> Fall

## 2012-07-05 NOTE — ED Notes (Signed)
Per EMS-pt states that she fell in the living room  and in juried her right ankle. Pain 10/10. Hx off Bipolar/Schizophernia.

## 2012-07-07 ENCOUNTER — Telehealth (INDEPENDENT_AMBULATORY_CARE_PROVIDER_SITE_OTHER): Payer: Self-pay

## 2012-07-07 NOTE — Telephone Encounter (Signed)
[name] called in to  Let us know patient fell over the weekend and crushed her foot and has several fractures. She was put in a splint and is following up with foot doctor Tuesday. She is having to cancel her surgery she had scheduled this Thursday 9/19. I told them I would send a message to Dr. Magnus Ivan and his nurse and transferred him to surgery sheduledrers so he can let them know they are cancelling so the hospital can become aware.

## 2012-07-08 ENCOUNTER — Inpatient Hospital Stay (HOSPITAL_COMMUNITY): Admission: RE | Admit: 2012-07-08 | Payer: Medicare Other | Source: Ambulatory Visit

## 2012-07-10 ENCOUNTER — Encounter (HOSPITAL_COMMUNITY): Admission: RE | Payer: Self-pay | Source: Ambulatory Visit

## 2012-07-10 ENCOUNTER — Ambulatory Visit (HOSPITAL_COMMUNITY): Admission: RE | Admit: 2012-07-10 | Payer: Medicare Other | Source: Ambulatory Visit | Admitting: Surgery

## 2012-07-10 SURGERY — EXCISION, HIDRADENITIS, AXILLA
Anesthesia: General | Laterality: Right

## 2012-07-22 ENCOUNTER — Telehealth (INDEPENDENT_AMBULATORY_CARE_PROVIDER_SITE_OTHER): Payer: Self-pay | Admitting: General Surgery

## 2012-07-22 NOTE — Telephone Encounter (Signed)
Pt called to request pain med refill for hidradenitits.  She had to cancel her surgery, secondary to ankle fracture, for which she is casted.  Still having pain in armpit.  Please advise if meds can be refilled.

## 2012-07-22 NOTE — Telephone Encounter (Signed)
Ok to give hydrocodone #30 5/325 with one refill 1 - 2 po q 4 to 6 hrs prn

## 2012-07-22 NOTE — Telephone Encounter (Signed)
Phoned in meds to [facility]:  [phone].  Pt is aware.

## 2012-09-26 ENCOUNTER — Encounter: Payer: Self-pay | Admitting: Family Medicine

## 2012-09-26 ENCOUNTER — Ambulatory Visit (INDEPENDENT_AMBULATORY_CARE_PROVIDER_SITE_OTHER): Payer: Medicare Other | Admitting: Family Medicine

## 2012-09-26 VITALS — BP 124/78 | HR 100 | Temp 98.6°F | Ht 69.0 in | Wt 245.0 lb

## 2012-09-26 DIAGNOSIS — D509 Iron deficiency anemia, unspecified: Secondary | ICD-10-CM

## 2012-09-26 DIAGNOSIS — S82853A Displaced trimalleolar fracture of unspecified lower leg, initial encounter for closed fracture: Secondary | ICD-10-CM | POA: Insufficient documentation

## 2012-09-26 DIAGNOSIS — Z23 Encounter for immunization: Secondary | ICD-10-CM

## 2012-09-26 MED ORDER — FERROUS SULFATE 325 (65 FE) MG PO TABS: 325.0000 mg | ORAL_TABLET | Freq: Every day | ORAL | Status: DC

## 2012-09-26 MED ORDER — TRAMADOL HCL 50 MG PO TABS: 50.0000 mg | ORAL_TABLET | Freq: Three times a day (TID) | ORAL | Status: DC | PRN

## 2012-09-26 MED ORDER — MELOXICAM 15 MG PO TABS: 15.0000 mg | ORAL_TABLET | Freq: Every day | ORAL | Status: DC

## 2012-09-26 NOTE — Assessment & Plan Note (Signed)
A: per report, appropriately treated by ortho. I am unable to see images. P: Tramadol for pain Patient would benefit from compression sleeve but she declined.  Advised f/u with ortho and PCP. Consider repeat x-rays but I would recommend obtaining and reviewing records first.

## 2012-09-26 NOTE — Patient Instructions (Addendum)
Kimberly [name],  Thank you for coming in today.   For pain:   Tramadol up to ever 8 hrs as needed Mobic daily I would like for you to wear a compression sleeve, please pick one up from the sports medicine clinic, it will be covered by your insurance.   F/u with ortho. F/u with Dr. Mikel Cella.   Dr. Armen Pickup

## 2012-09-26 NOTE — Assessment & Plan Note (Signed)
Restart iron. Repeat Hgb once on iron for 12 weeks.

## 2012-09-26 NOTE — Progress Notes (Signed)
Subjective:     Patient ID: Kimberly [name], female   DOB: [birth year], 41 y.o.   MRN: [id number]  HPI 41 yo F presents for same day visit to discuss the following:  1. R ankle fracture: trimalleolar fracture 3 months ago. Patient treated by Ortho. She was placed in a cast, advanced to a boot and then informed that she could come out of hte boot. She reports significant pain. She was give a narcotic initially then tramadol and naproxen. She is now taking ibuprofen multiple times daily.   2. Fatigue: previously on iron. Would like to restart iron. denies fever, chills, weight loss.   Review of Systems As per HPI    Objective:   Physical Exam BP 124/78  Pulse 100  Temp 98.6 F (37 C) (Oral)  Ht 5\' 9"  (1.753 m)  Wt 245 lb (111.131 kg)  BMI 36.18 kg/m2  LMP 09/18/2012 General appearance: alert, cooperative and no distress Extremities: R ankle with limited ROM in all planes compared to L. Swelling anterior to lateral and medial malleolus. TTP lateral malleolus and along 5 th metatarsal. Gait slow but normal.      Assessment and Plan:

## 2012-10-01 ENCOUNTER — Other Ambulatory Visit: Payer: Self-pay | Admitting: *Deleted

## 2012-10-01 DIAGNOSIS — S82853A Displaced trimalleolar fracture of unspecified lower leg, initial encounter for closed fracture: Secondary | ICD-10-CM

## 2012-10-01 MED ORDER — MELOXICAM 15 MG PO TABS: 15.0000 mg | ORAL_TABLET | Freq: Every day | ORAL | Status: DC

## 2012-10-08 ENCOUNTER — Ambulatory Visit: Payer: Medicare Other | Admitting: Sports Medicine

## 2012-10-24 ENCOUNTER — Other Ambulatory Visit: Payer: Self-pay | Admitting: Family Medicine

## 2012-11-05 ENCOUNTER — Ambulatory Visit: Payer: Medicare Other | Admitting: Family Medicine

## 2012-11-20 ENCOUNTER — Ambulatory Visit: Payer: Medicare Other | Admitting: Family Medicine

## 2013-01-12 ENCOUNTER — Ambulatory Visit (INDEPENDENT_AMBULATORY_CARE_PROVIDER_SITE_OTHER): Payer: Medicare Other | Admitting: Family Medicine

## 2013-01-12 ENCOUNTER — Encounter: Payer: Self-pay | Admitting: Family Medicine

## 2013-01-12 VITALS — BP 143/92 | HR 98 | Temp 98.7°F | Ht 69.0 in | Wt 249.0 lb

## 2013-01-12 DIAGNOSIS — K047 Periapical abscess without sinus: Secondary | ICD-10-CM

## 2013-01-12 MED ORDER — MELOXICAM 15 MG PO TABS: ORAL_TABLET | ORAL | Status: DC

## 2013-01-12 MED ORDER — AMOXICILLIN-POT CLAVULANATE 875-125 MG PO TABS: 1.0000 | ORAL_TABLET | Freq: Two times a day (BID) | ORAL | Status: DC

## 2013-01-12 MED ORDER — MORPHINE SULFATE 4 MG/ML IJ SOLN: 1.0000 mg | Freq: Once | INTRAMUSCULAR | Status: DC

## 2013-01-12 MED ORDER — TRAMADOL HCL 50 MG PO TABS: 50.0000 mg | ORAL_TABLET | Freq: Three times a day (TID) | ORAL | Status: DC | PRN

## 2013-01-12 MED ORDER — MORPHINE SULFATE 10 MG/ML IJ SOLN
1.0000 mg | Freq: Once | INTRAMUSCULAR | Status: AC
  Administered 2013-01-12: 1 mg via INTRAMUSCULAR

## 2013-01-12 NOTE — Progress Notes (Signed)
  Subjective:    Patient ID: Kimberly [name], female    DOB: April 13, 1971, 42 y.o.   MRN: [id number]  HPIWalk in appt for dental pain  Chipped a front tooth on Friday.  On Saturday, chipped a back lower left molar.  Has had increasing pain and right sided facial swelling for the past 2 days.    No fever, chills.  Drinking, but finds it difficult to eat due to tooth pain.    I have reviewed patient's  PMH, FH, and Social history and Medications as related to this visit. No history of immunocompromise.  + smoker.    Review of Systems See HPI    Objective:   Physical Exam GEN: uncomfortable appearing Mouth: Poor dentition with multiple missing teeth and multiple dental caries.  Reluctant to open mouth due to pain, but able to do so with prompting.  No drooling.  Mild soft tissues swelling left jaw.  No erythema, fluctuance, or lymphadenopathy.  Posterior oropharynx with no signs of edema, mass effect.  Able to tolerate oral fluids in office.       Assessment & Plan:

## 2013-01-12 NOTE — Patient Instructions (Addendum)
Start antibiotics now for dental infection  Important to contact dentist to get in to be seen  Follow-up in office tomorrow for recheck  If you are unable to take sips of liquid, worsening pain, or fever, go to ER

## 2013-01-12 NOTE — Addendum Note (Signed)
Addended by: Radene Ou on: 01/12/2013 12:30 PM   Modules accepted: Orders

## 2013-01-12 NOTE — Assessment & Plan Note (Signed)
Patient with likely dental abscess after chipping tooth 2 days ago.  Some localized soft tissue swelling, but no signs of pharyngeal swelling or abscess, or systemic symptoms such as fever or lymphadenopathy in immunocompetent host.  Will rx augmentin, tramadol, meloxicam for pain control.    Advised to follow-up tomorrow in office.  Red flags of worsening pain, fever, or unable to continue liquids, will go to ER

## 2013-01-13 ENCOUNTER — Ambulatory Visit: Payer: Medicare Other | Admitting: Family Medicine

## 2013-01-19 ENCOUNTER — Telehealth: Payer: Self-pay | Admitting: Family Medicine

## 2013-01-19 MED ORDER — CITALOPRAM HYDROBROMIDE 40 MG PO TABS: 20.0000 mg | ORAL_TABLET | Freq: Two times a day (BID) | ORAL | Status: DC

## 2013-01-19 NOTE — Telephone Encounter (Signed)
Pt has appt w/ 4/23 w/ PCP and is out of her celexa - wants to know if she can get enough to last until appt  [facility] - [address]

## 2013-01-19 NOTE — Telephone Encounter (Signed)
One month refill sent to [facility].  Thanks, [employer]. Amey Hossain, M.D.

## 2013-02-10 ENCOUNTER — Other Ambulatory Visit: Payer: Self-pay | Admitting: Family Medicine

## 2013-02-11 ENCOUNTER — Ambulatory Visit: Payer: Medicare Other | Admitting: Family Medicine

## 2013-02-15 ENCOUNTER — Other Ambulatory Visit: Payer: Self-pay | Admitting: Family Medicine

## 2013-02-16 ENCOUNTER — Telehealth: Payer: Self-pay | Admitting: Family Medicine

## 2013-02-16 MED ORDER — CITALOPRAM HYDROBROMIDE 40 MG PO TABS: 20.0000 mg | ORAL_TABLET | Freq: Two times a day (BID) | ORAL | Status: DC

## 2013-02-16 NOTE — Telephone Encounter (Signed)
Returned call to patient.  Informed that we do not administer IV antibiotics in our office.  Can be evaluated for possibe oral antibiotics.  Appt scheduled for evaluation tomorrow at 2:30 pm with Dr. Mikel Cella.  Gaylene Brooks, RN

## 2013-02-16 NOTE — Telephone Encounter (Signed)
Patient had several teeth extracted on Friday and her Dentist told her at that time that she had a very bad infection and needed IV antibiotics.  She wanted to know if that is something she could have done here or if she needed to go to the hospital for this.

## 2013-02-17 ENCOUNTER — Ambulatory Visit: Payer: Medicare Other | Admitting: Family Medicine

## 2013-02-17 ENCOUNTER — Other Ambulatory Visit: Payer: Self-pay | Admitting: *Deleted

## 2013-02-17 MED ORDER — QUETIAPINE FUMARATE 200 MG PO TABS: 200.0000 mg | ORAL_TABLET | Freq: Two times a day (BID) | ORAL | Status: DC

## 2013-02-20 ENCOUNTER — Ambulatory Visit (INDEPENDENT_AMBULATORY_CARE_PROVIDER_SITE_OTHER): Payer: Medicare Other | Admitting: Family Medicine

## 2013-02-20 ENCOUNTER — Encounter: Payer: Self-pay | Admitting: Family Medicine

## 2013-02-20 VITALS — BP 132/80 | Temp 97.8°F | Wt 237.0 lb

## 2013-02-20 DIAGNOSIS — L0390 Cellulitis, unspecified: Secondary | ICD-10-CM

## 2013-02-20 DIAGNOSIS — R6883 Chills (without fever): Secondary | ICD-10-CM

## 2013-02-20 DIAGNOSIS — L0291 Cutaneous abscess, unspecified: Secondary | ICD-10-CM

## 2013-02-20 DIAGNOSIS — K047 Periapical abscess without sinus: Secondary | ICD-10-CM

## 2013-02-20 LAB — CBC WITH DIFFERENTIAL/PLATELET
Basophils Absolute: 0.1 10*3/uL (ref 0.0–0.1)
Basophils Relative: 1 % (ref 0–1)
Eosinophils Absolute: 0.1 10*3/uL (ref 0.0–0.7)
Eosinophils Relative: 2 % (ref 0–5)
HCT: 38.9 % (ref 36.0–46.0)
Hemoglobin: 12.9 g/dL (ref 12.0–15.0)
Lymphocytes Relative: 40 % (ref 12–46)
Lymphs Abs: 2.6 10*3/uL (ref 0.7–4.0)
MCH: 23.5 pg — ABNORMAL LOW (ref 26.0–34.0)
MCHC: 33.2 g/dL (ref 30.0–36.0)
MCV: 71 fL — ABNORMAL LOW (ref 78.0–100.0)
Monocytes Absolute: 0.5 10*3/uL (ref 0.1–1.0)
Monocytes Relative: 8 % (ref 3–12)
Neutro Abs: 3.2 10*3/uL (ref 1.7–7.7)
Neutrophils Relative %: 49 % (ref 43–77)
Platelets: 298 10*3/uL (ref 150–400)
RBC: 5.48 MIL/uL — ABNORMAL HIGH (ref 3.87–5.11)
RDW: 17.2 % — ABNORMAL HIGH (ref 11.5–15.5)
WBC: 6.5 10*3/uL (ref 4.0–10.5)

## 2013-02-20 LAB — BASIC METABOLIC PANEL
BUN: 12 mg/dL (ref 6–23)
CO2: 26 mEq/L (ref 19–32)
Calcium: 9.6 mg/dL (ref 8.4–10.5)
Chloride: 98 mEq/L (ref 96–112)
Creat: 0.96 mg/dL (ref 0.50–1.10)
Glucose, Bld: 77 mg/dL (ref 70–99)
Potassium: 3.8 mEq/L (ref 3.5–5.3)
Sodium: 131 mEq/L — ABNORMAL LOW (ref 135–145)

## 2013-02-20 MED ORDER — SULFAMETHOXAZOLE-TRIMETHOPRIM 800-160 MG PO TABS: 1.0000 | ORAL_TABLET | Freq: Two times a day (BID) | ORAL | Status: DC

## 2013-02-20 NOTE — Assessment & Plan Note (Signed)
Afebrile, normal vital signs, do not suspect systemic infection, but given patient's story will check CBC and blood cultures and BMET to make sure no sign of systemic illness.

## 2013-02-20 NOTE — Assessment & Plan Note (Addendum)
Minimal infection in R axilla, has had course of Augmentin and azithromycin, also one other antibiotic that she does not know the name of.  rx for bactrim x1 week, f/u with PCP in 1 week. Again, pain out of proportion to exam, concern for narcotic seeking behavior, I refused to prescribe narcotics today given this situation.

## 2013-02-20 NOTE — Assessment & Plan Note (Signed)
S/P tooth removal x2, no facial abnormalities today.  I refused to prescribe more narcotics as patient is here with multiple bottles of hydrocodone.  Patient became angry at this and I suggested trying ibuprofen and tylenol.

## 2013-02-20 NOTE — Patient Instructions (Signed)
I am sorry you are not feeling well.  Please start taking the new antibiotic- take it twice a day for a week.  I am checking some blood work to make sure your infection is not in your blood.   Please make an appointment to see Dr. Mikel Cella in 1 week.

## 2013-02-20 NOTE — Progress Notes (Signed)
  Subjective:    Patient ID: Kimberly [name], female    DOB: 09-28-1971, 42 y.o.   MRN: [id number]  HPI  [name] comes in for a same day visit with multiple complaints.  She tells me a long story about her teeth and teeth abscess, seeing two different dentists who removed teeth, multiple courses of antibiotics and multiple prescriptions for narcotics for the pain.  She says she had infection in her whole face, that her whole face was swollen.  She says she has not felt right since this happened, complains of chills, numbness tingling, and abscess in R axilla.  She says she feels like she is dying.    Review of Systems See HPI    Objective:   Physical Exam BP 132/80  Temp(Src) 97.8 F (36.6 C) (Oral)  Wt 237 lb (107.502 kg)  BMI 34.98 kg/m2  LMP 02/02/2013 General appearance: alert, cooperative, no distress and morbidly obese Head: Normocephalic, without obvious abnormality, atraumatic CV: RRR no m/r/g Pulm: CTAB R axilla: Small draining abscess with minimal amount of puss, no palpable fluctuance or significant induration, skin is not erythematous or warm.  Patient's pain is out of proportion to exam.  Extremities: Warm, well perfused, 2+ pulses.       Assessment & Plan:

## 2013-02-23 ENCOUNTER — Telehealth: Payer: Self-pay | Admitting: Family Medicine

## 2013-02-23 ENCOUNTER — Encounter: Payer: Self-pay | Admitting: *Deleted

## 2013-02-23 NOTE — Telephone Encounter (Signed)
Attempted to call but no answer and VM was full.  Please inform of the below if they call back.  I will also mail letter and appt reminder. Uriel Dowding, Maryjo Rochester

## 2013-02-23 NOTE — Telephone Encounter (Signed)
Please call patient and let her know her labs were normal - no signs of serious bacterial infection, bacteria in her blood, or dehydration.  Please ask her to keep her follow up appointment with Dr. Mikel Cella.   Kimberly [name] 02/23/2013

## 2013-02-25 ENCOUNTER — Other Ambulatory Visit: Payer: Self-pay | Admitting: *Deleted

## 2013-02-25 MED ORDER — CITALOPRAM HYDROBROMIDE 40 MG PO TABS: 20.0000 mg | ORAL_TABLET | Freq: Two times a day (BID) | ORAL | Status: DC

## 2013-02-25 MED ORDER — LAMOTRIGINE 200 MG PO TABS: ORAL_TABLET | ORAL | Status: DC

## 2013-02-26 LAB — CULTURE, BLOOD (SINGLE)
Organism ID, Bacteria: NO GROWTH
Organism ID, Bacteria: NO GROWTH

## 2013-03-03 ENCOUNTER — Inpatient Hospital Stay: Payer: Medicare Other | Admitting: Family Medicine

## 2013-03-04 ENCOUNTER — Encounter: Payer: Self-pay | Admitting: Family Medicine

## 2013-03-04 ENCOUNTER — Ambulatory Visit (INDEPENDENT_AMBULATORY_CARE_PROVIDER_SITE_OTHER): Payer: Medicare Other | Admitting: Family Medicine

## 2013-03-04 VITALS — BP 109/74 | HR 97 | Temp 97.9°F | Ht 69.0 in | Wt 233.0 lb

## 2013-03-04 DIAGNOSIS — K0889 Other specified disorders of teeth and supporting structures: Secondary | ICD-10-CM

## 2013-03-04 DIAGNOSIS — F3160 Bipolar disorder, current episode mixed, unspecified: Secondary | ICD-10-CM

## 2013-03-04 DIAGNOSIS — M79609 Pain in unspecified limb: Secondary | ICD-10-CM

## 2013-03-04 DIAGNOSIS — M25579 Pain in unspecified ankle and joints of unspecified foot: Secondary | ICD-10-CM

## 2013-03-04 DIAGNOSIS — M79671 Pain in right foot: Secondary | ICD-10-CM

## 2013-03-04 DIAGNOSIS — K089 Disorder of teeth and supporting structures, unspecified: Secondary | ICD-10-CM

## 2013-03-04 DIAGNOSIS — M25571 Pain in right ankle and joints of right foot: Secondary | ICD-10-CM

## 2013-03-04 MED ORDER — KETOROLAC TROMETHAMINE 30 MG/ML IJ SOLN
30.0000 mg | Freq: Once | INTRAMUSCULAR | Status: AC
  Administered 2013-03-04: 30 mg via INTRAMUSCULAR

## 2013-03-04 MED ORDER — TRAMADOL HCL 50 MG PO TABS: 50.0000 mg | ORAL_TABLET | Freq: Three times a day (TID) | ORAL | Status: DC | PRN

## 2013-03-04 NOTE — Assessment & Plan Note (Signed)
No sign of abscess today. Continue current antibiotic. Given Rx for Tramadol, but Hydrocodone will not be prescribed. Given her acute pain, will also give Toradol shot here in clinic which will hopefully help as well.

## 2013-03-04 NOTE — Assessment & Plan Note (Signed)
Pain in ankle secondary to her ankle fracture last year. Patient had body helix previously and it worked well for her and she is requesting another. I have informed her that our clinic does not carry this type of brace and I could either give her a written Rx for a brace she can get at the medical supply store or she will have to go back to sports medicine. She states she had rather go to Sports Medicine for an appointment only for body helix fitting. Pt informed she must keep any appt made for her.

## 2013-03-04 NOTE — Assessment & Plan Note (Signed)
Patient states she is taking all medications, and I have been refilling medications at doses she has been on. Pt requesting increased doses. I have explained to pt and her husband that this is not my specialty and I am not comfortable increasing doses of medications since she is not able to full explain to me everything that is going on. I have given her a list of local psychiatrists that accept Medicare and hopefully she can get some help there. For now, keep same doses and RTC within one month. (Of note, patient and husband were informed of all the missed appts by her family including Ms. Manson Passey. I have told them to write down appointments and keep them.)

## 2013-03-04 NOTE — Patient Instructions (Signed)
It was good to see you today!  1. You do not need a new antibiotic today. Your arm looks good, and your tooth does not have any pus. I have refilled your Tramadol. Try to get back to see the dentist if you can. 2. I have put in a new referral to sports medicine to discuss a body helix brace for your pain after your broken ankle. 3. We cannot reschedule your MRI since it has been over 6 months. Please make an appointment to come see me to discuss your back again so I can put in a new order. 4. I have given you a list of local doctors to discuss increasing your medications.  **Please come back to see me within the next month! This is very important.  Maie Kesinger M. Kory Rains, M.D.

## 2013-03-04 NOTE — Progress Notes (Signed)
Patient ID: Arshia Spellman, female   DOB: May 30, 1971, 42 y.o.   MRN: [id number]  [facility] Nieves Chapa M. Shikara Mcauliffe, MD Phone: [phone]   Subjective: HPI: Patient is a 42 y.o. female presenting to clinic today for follow up. Concerns today include teeth abscesses, ankle pain for brace, bipolar/anxiety  1. Dental pain - Teeth pulled 16 teeth about "3 weeks ago" and was started on antibiotic and hydrocodone. Follow up with another dentist and pulled 2 more teeth, and had another antibiotic and more hydrocodone. Was told she still had a dental abscess and came back to see Family Medicine, which she did last week. At that time was started on antibiotic for both dental pain and abscess under arm and has not seen any difference. (She states she still has abscess under arm that has since busted and is not bothering her today.)  2. Ankle pain - Fractured ankle one year ago. (Sept 2013 based on medical record) Had a body helix brace which was helping and she has misplaced it. States that her ankle is painful. She states it is hard to get around and is in a lot of pain. She is requesting another brace.  3. Bipolar - Taking medications as prescribed. Was previously seen at [facility] but is not seeing anyone else. States she "stays at home so nobody can hurt her." She quickly snapped at me that she "does not want to keep bringing up her mother's murder". She rambles on about her social security and being hurt. She mentions her mother's murder again. Patient required redirection multiple times during the visit. She was very tearful and made poor eye contact.   History Reviewed: Non smoker  ROS: Please see HPI above.  Objective: Office vital signs reviewed. BP 109/74  Pulse 97  Temp(Src) 97.9 F (36.6 C) (Oral)  Ht 5\' 9"  (1.753 m)  Wt 233 lb (105.688 kg)  BMI 34.39 kg/m2  LMP 02/02/2013  Physical Examination:  General: Awake, alert. Answers questions. Husband, [name], at  bedside HEENT: Atraumatic, normocephalic. Missing all by 3-4 teeth. Upper right side with a tooth that has been broken. Mild erythema of the gum but no sign of abscess today. Some TTP of cheek, but no swelling or fluctuance noted Neck: No masses palpated. No LAD Pulm: CTAB, no wheezes Cardio: RRR, no murmurs appreciated Abdomen:+BS, soft, nontender, nondistended Extremities: No edema Neuro: Grossly intact Psych: Difficulty maintaining eye contact. Tearful. Talking in circles, and has to be redirected. No extrapyramidal symptoms appreciated  Assessment: 42 y.o. female follow up  Plan: See Problem List and After Visit Summary

## 2013-03-18 ENCOUNTER — Other Ambulatory Visit: Payer: Self-pay | Admitting: *Deleted

## 2013-03-18 MED ORDER — CITALOPRAM HYDROBROMIDE 40 MG PO TABS: 20.0000 mg | ORAL_TABLET | Freq: Two times a day (BID) | ORAL | Status: DC

## 2013-03-18 MED ORDER — LAMOTRIGINE 200 MG PO TABS: ORAL_TABLET | ORAL | Status: DC

## 2013-03-19 ENCOUNTER — Other Ambulatory Visit: Payer: Self-pay | Admitting: Family Medicine

## 2013-03-19 MED ORDER — MELOXICAM 15 MG PO TABS: ORAL_TABLET | ORAL | Status: DC

## 2013-03-24 ENCOUNTER — Ambulatory Visit: Payer: Medicare Other | Admitting: Family Medicine

## 2013-04-15 ENCOUNTER — Emergency Department (HOSPITAL_COMMUNITY)
Admission: EM | Admit: 2013-04-15 | Discharge: 2013-04-16 | Disposition: A | Discharge status: Home / Self Care | Payer: Medicare Other | Attending: Emergency Medicine | Admitting: Emergency Medicine

## 2013-04-15 DIAGNOSIS — L0291 Cutaneous abscess, unspecified: Secondary | ICD-10-CM

## 2013-04-15 DIAGNOSIS — Z79899 Other long term (current) drug therapy: Secondary | ICD-10-CM | POA: Insufficient documentation

## 2013-04-15 DIAGNOSIS — F209 Schizophrenia, unspecified: Secondary | ICD-10-CM | POA: Insufficient documentation

## 2013-04-15 DIAGNOSIS — Z791 Long term (current) use of non-steroidal anti-inflammatories (NSAID): Secondary | ICD-10-CM | POA: Insufficient documentation

## 2013-04-15 DIAGNOSIS — IMO0002 Reserved for concepts with insufficient information to code with codable children: Secondary | ICD-10-CM | POA: Insufficient documentation

## 2013-04-15 DIAGNOSIS — D649 Anemia, unspecified: Secondary | ICD-10-CM | POA: Insufficient documentation

## 2013-04-15 DIAGNOSIS — Z8739 Personal history of other diseases of the musculoskeletal system and connective tissue: Secondary | ICD-10-CM | POA: Insufficient documentation

## 2013-04-15 DIAGNOSIS — F17200 Nicotine dependence, unspecified, uncomplicated: Secondary | ICD-10-CM | POA: Insufficient documentation

## 2013-04-15 DIAGNOSIS — F319 Bipolar disorder, unspecified: Secondary | ICD-10-CM | POA: Insufficient documentation

## 2013-04-15 NOTE — ED Notes (Signed)
Pt has cyst under right axilla area. Pt states it has been there for about 6 months. Pt c/o pain this pm. Pt states "it feel like there is a knife stabbing me in my arm" pt also states that there is a green d/c noted coming from cyst at times. Pt was brought in via ems

## 2013-04-16 ENCOUNTER — Ambulatory Visit: Payer: Medicare Other | Admitting: Family Medicine

## 2013-04-16 MED ORDER — OXYCODONE-ACETAMINOPHEN 5-325 MG PO TABS
2.0000 | ORAL_TABLET | Freq: Once | ORAL | Status: AC
  Administered 2013-04-16: 2 via ORAL
  Filled 2013-04-16: qty 2

## 2013-04-16 MED ORDER — SULFAMETHOXAZOLE-TRIMETHOPRIM 800-160 MG PO TABS: 1.0000 | ORAL_TABLET | Freq: Two times a day (BID) | ORAL | Status: DC

## 2013-04-16 MED ORDER — HYDROCODONE-ACETAMINOPHEN 5-325 MG PO TABS: 2.0000 | ORAL_TABLET | Freq: Four times a day (QID) | ORAL | Status: DC | PRN

## 2013-04-16 NOTE — ED Provider Notes (Signed)
Medical screening examination/treatment/procedure(s) were performed by non-physician practitioner and as supervising physician I was immediately available for consultation/collaboration.  Sunnie Nielsen, MD 04/16/13 [phone]

## 2013-04-16 NOTE — ED Provider Notes (Signed)
History    CSN: [id number] Arrival date & time 04/15/13  2347  First MD Initiated Contact with Patient 04/16/13 0002     Chief Complaint  Patient presents with  . Cyst   (Consider location/radiation/quality/duration/timing/severity/associated sxs/prior Treatment) HPI Comments: Patient presents to the emergency department with chief complaint of abscess. She states that she has recurrent abscesses. She also suffers from extensive psychiatric illness. Accurate history is difficult to obtain, but prior notes have been reviewed. She has a history of abscesses, and has had a sebaceous cyst excised from the right axilla. She has been taking antibiotics and pain medication. She states the pain is 10 out of 10. She states that his been draining yellow/green discharge. She states that nothing makes her pain better, and everything makes it worse.   The history is provided by the patient. No language interpreter was used.   Past Medical History  Diagnosis Date  . Anemia   . Arthritis   . Schizophrenia   . Bipolar 1 disorder    No past surgical history on file. No family history on file. History  Substance Use Topics  . Smoking status: Current Every Day Smoker -- 1.00 packs/day    Types: Cigarettes  . Smokeless tobacco: Not on file  . Alcohol Use: No   OB History   Grav Para Term Preterm Abortions TAB SAB Ect Mult Living                 Review of Systems  All other systems reviewed and are negative.    Allergies  Review of patient's allergies indicates no known allergies.  Home Medications   Current Outpatient Rx  Name  Route  Sig  Dispense  Refill  . acetaminophen (TYLENOL) 500 MG tablet   Oral   Take 1,000 mg by mouth every 6 (six) hours as needed. Pain         . citalopram (CELEXA) 40 MG tablet   Oral   Take 0.5-1 tablets (20-40 mg total) by mouth 2 (two) times daily. Takes 1 tablet in the morning and 1/2 tablet at night   45 tablet   0   . ferrous sulfate 325  (65 FE) MG tablet   Oral   Take 1 tablet (325 mg total) by mouth daily with breakfast.   90 tablet   0   . lamoTRIgine (LAMICTAL) 200 MG tablet      TAKE 1 TABLET BY MOUTH TWICE DAILY   60 tablet   0   . meloxicam (MOBIC) 15 MG tablet      TAKE 1 TABLET BY MOUTH EVERY DAY   30 tablet   0   . QUEtiapine (SEROQUEL) 200 MG tablet   Oral   Take 1 tablet (200 mg total) by mouth 2 (two) times daily.   60 tablet   5   . sulfamethoxazole-trimethoprim (SEPTRA DS) 800-160 MG per tablet   Oral   Take 1 tablet by mouth 2 (two) times daily.   14 tablet   0   . traMADol (ULTRAM) 50 MG tablet   Oral   Take 1 tablet (50 mg total) by mouth every 8 (eight) hours as needed for pain.   30 tablet   0    BP 121/73  Pulse 87  Temp(Src) 98.6 F (37 C) (Oral)  Resp 16  SpO2 96% Physical Exam  Nursing note and vitals reviewed. Constitutional: She is oriented to person, place, and time. She appears well-developed and well-nourished.  HENT:  Head: Normocephalic and atraumatic.  Eyes: Conjunctivae and EOM are normal.  Neck: Normal range of motion.  Cardiovascular: Normal rate.   Pulmonary/Chest: Effort normal.  Abdominal: She exhibits no distension.  Musculoskeletal: Normal range of motion.  Neurological: She is alert and oriented to person, place, and time.  Skin: Skin is dry.  Right axillary abscess, 2 x 2 centimeters with some induration, mild surrounding erythema, no gross discharge or drainage  Psychiatric:  Extremely dramatic    ED Course  Procedures (including critical care time)  INCISION AND DRAINAGE Performed by: Roxy Horseman Consent: Verbal consent obtained. Risks and benefits: risks, benefits and alternatives were discussed Type: abscess  Body area: Right axilla  Anesthesia: local infiltration  Incision was made with a scalpel.  Local anesthetic: lidocaine 1 % without epinephrine  Anesthetic total: 5 ml  Complexity: complex Blunt dissection to break  up loculations  Drainage: purulent  Drainage amount: 5 ML   Packing material: Not packed   Patient tolerance: Patient tolerated the procedure well with no immediate complications.    Labs Reviewed - No data to display No results found. 1. Abscess     MDM  Patient with recurrent abscesses. Doubt that this abscess is been present for 6 months, so small and well localized. Suspects that she's been having recurrent abscesses for the past 6 months, and this is why she states that his been present for 6 months. She remarks that it has been draining by itself several times. Abscess was drained successfully the emergency department tonight, however patient is still complaining of pain out of exam. In reviewing prior notes, this is typical. Will give the patient a few pain pills, and Bactrim. Recommend followup with surgery. Patient understands and agrees with the plan. Patient is afebrile. Vital signs are stable. She is stable and ready for discharge.  Roxy Horseman, [name] 04/16/13 [phone]

## 2013-04-20 ENCOUNTER — Other Ambulatory Visit: Payer: Self-pay | Admitting: *Deleted

## 2013-04-20 MED ORDER — CITALOPRAM HYDROBROMIDE 40 MG PO TABS: 20.0000 mg | ORAL_TABLET | Freq: Two times a day (BID) | ORAL | Status: DC

## 2013-05-01 ENCOUNTER — Ambulatory Visit (INDEPENDENT_AMBULATORY_CARE_PROVIDER_SITE_OTHER): Payer: Medicare Other | Admitting: Family Medicine

## 2013-05-01 ENCOUNTER — Encounter: Payer: Self-pay | Admitting: Family Medicine

## 2013-05-01 VITALS — BP 129/74 | HR 105 | Temp 98.0°F | Wt 233.0 lb

## 2013-05-01 DIAGNOSIS — L732 Hidradenitis suppurativa: Secondary | ICD-10-CM

## 2013-05-01 DIAGNOSIS — M25531 Pain in right wrist: Secondary | ICD-10-CM | POA: Insufficient documentation

## 2013-05-01 DIAGNOSIS — M25539 Pain in unspecified wrist: Secondary | ICD-10-CM

## 2013-05-01 DIAGNOSIS — M25532 Pain in left wrist: Secondary | ICD-10-CM

## 2013-05-01 DIAGNOSIS — R5381 Other malaise: Secondary | ICD-10-CM

## 2013-05-01 DIAGNOSIS — R531 Weakness: Secondary | ICD-10-CM | POA: Insufficient documentation

## 2013-05-01 MED ORDER — TRAMADOL HCL 50 MG PO TABS: 50.0000 mg | ORAL_TABLET | Freq: Three times a day (TID) | ORAL | Status: DC | PRN

## 2013-05-01 MED ORDER — MELOXICAM 15 MG PO TABS: ORAL_TABLET | ORAL | Status: DC

## 2013-05-01 NOTE — Assessment & Plan Note (Signed)
Advised to get Spica thumb splints to help with pain. (Rx written for medical supply store) Wear as much as tolerated. Take Mobic and Tramadol for pain. Slowly increase motion once tolerated. RTC in 4 weeks for follow up, or sooner if needed

## 2013-05-01 NOTE — Assessment & Plan Note (Signed)
No focal findings. BP stable. Neuro exam normal. Her medication changes may be contributing but at this time, I do not think she is having a CVA or needs a head CT. Explained to patient and she agrees. Will follow up if symptoms return or worsen.

## 2013-05-01 NOTE — Progress Notes (Signed)
Patient ID: Kimberly [name], female   DOB: Jan 29, 1971, 42 y.o.   MRN: [id number]  [facility] Amber M. Hairford, MD Phone: [phone]   Subjective: HPI: Patient is a 42 y.o. female presenting to clinic today for appointment for hand pain.  Subjective:    Kimberly [name] is a 42 y.o. female who presents for evaluation of bilateral hand/finger pain. Onset was gradual, starting about 1 week ago. The pain is severe, worsens with movement, and is relieved by rest. There is associated numbness and weakness in both hands. Evaluation to date: none and was evaluated about 6 years ago for similar complaints of carpal tunnel. Treatment to date: avoidance of offending activity, wrist splint which is ineffective.  Patient states she feels like she had a stroke earlier this week. She states that her left arm is more weak and her face has a droop on the right. States she had a stroke with she was 42 years old, results were negative but symptoms were stroke-like. She currently endorses pain on entire right side. Changes in medications by mental health, added Risperdal and klonopin. Cut back on Seroquel and is weaning off of Celexa.  Patient was seen in ED for abscess a few weeks ago, was drained and started on antibiotics. Needs to rearrange an appointment   The following portions of the patient's history were reviewed and updated as appropriate: allergies, current medications, past family history, past medical history, past social history, past surgical history and problem list.  Review of Systems Pertinent items are noted in HPI.    Objective:    BP 129/74  Pulse 105  Temp(Src) 98 F (36.7 C) (Oral)  Wt 233 lb (105.688 kg)  BMI 34.39 kg/m2  LMP 03/23/2013  General: Awake, alert. Answers questions  HEENT: AT, [place]. MMM. Cranial nerves intact. No facial droop appreciated  Neuro: No focal findings, strength normal in all extremities  Right hand:  sensation normal, positive  Phalen, positive Tinel, remainder of ipsilateral wrist, hand and finger exam is normal and normal ipsilateral elbow  Left hand:  radial pulse normal, sensation normal, positive Phalen, positive Tinel, remainder of ipsilateral wrist, hand and finger exam is normal and normal ipsilateral elbow    Assessment:    Carpal tunnel syndrome    Plan:    See problem based assessment and plan

## 2013-05-01 NOTE — Assessment & Plan Note (Signed)
Given Dr. Eliberto Ivory number to make an appt to reschedule surgery.

## 2013-05-01 NOTE — Patient Instructions (Addendum)
It was good to see you. I think you are having carpal tunnel syndrome in your wrists. Try to limit your movement with a thumb spica splint. You have to get this at a medical supply store. Take the meloxicam and Tramadol for pain.   I do not think you have a stroke. I am sorry you do not feel like yourself, I hope that improves.  Please call Dr. Doroteo Glassman office to arrange your surgery. The clinic number is [phone].  I will see you back within one month for a follow up.   Jennice Renegar M. Zavon Hyson, M.D.

## 2013-05-14 ENCOUNTER — Other Ambulatory Visit: Payer: Self-pay | Admitting: *Deleted

## 2013-05-14 MED ORDER — LAMOTRIGINE 200 MG PO TABS: ORAL_TABLET | ORAL | Status: DC

## 2013-05-21 ENCOUNTER — Other Ambulatory Visit: Payer: Self-pay | Admitting: Family Medicine

## 2013-06-03 ENCOUNTER — Ambulatory Visit: Payer: Medicare Other | Admitting: Family Medicine

## 2013-06-05 ENCOUNTER — Encounter (INDEPENDENT_AMBULATORY_CARE_PROVIDER_SITE_OTHER): Payer: Medicare Other | Admitting: Surgery

## 2013-06-10 ENCOUNTER — Other Ambulatory Visit: Payer: Self-pay | Admitting: Family Medicine

## 2013-06-17 ENCOUNTER — Other Ambulatory Visit: Payer: Self-pay | Admitting: *Deleted

## 2013-06-17 MED ORDER — CITALOPRAM HYDROBROMIDE 40 MG PO TABS: 20.0000 mg | ORAL_TABLET | Freq: Two times a day (BID) | ORAL | Status: DC

## 2013-06-23 ENCOUNTER — Other Ambulatory Visit: Payer: Self-pay | Admitting: *Deleted

## 2013-06-23 ENCOUNTER — Encounter (INDEPENDENT_AMBULATORY_CARE_PROVIDER_SITE_OTHER): Payer: Medicare Other | Admitting: Surgery

## 2013-06-23 MED ORDER — CITALOPRAM HYDROBROMIDE 40 MG PO TABS: ORAL_TABLET | ORAL | Status: DC

## 2013-06-25 ENCOUNTER — Other Ambulatory Visit: Payer: Self-pay | Admitting: *Deleted

## 2013-06-25 NOTE — Telephone Encounter (Signed)
This Rx has already been filled.  Amber M. Hairford, M.D.

## 2013-07-06 ENCOUNTER — Ambulatory Visit (INDEPENDENT_AMBULATORY_CARE_PROVIDER_SITE_OTHER): Payer: Medicare Other | Admitting: Surgery

## 2013-08-31 ENCOUNTER — Encounter (INDEPENDENT_AMBULATORY_CARE_PROVIDER_SITE_OTHER): Payer: Medicare Other | Admitting: Surgery

## 2013-10-22 HISTORY — PX: BREAST LUMPECTOMY: SHX2

## 2014-02-25 ENCOUNTER — Ambulatory Visit (INDEPENDENT_AMBULATORY_CARE_PROVIDER_SITE_OTHER): Payer: Medicare Other | Admitting: Family Medicine

## 2014-02-25 ENCOUNTER — Other Ambulatory Visit (HOSPITAL_COMMUNITY)
Admission: RE | Admit: 2014-02-25 | Discharge: 2014-02-25 | Disposition: A | Discharge status: Home / Self Care | Payer: Medicare Other | Source: Ambulatory Visit | Attending: Family Medicine | Admitting: Family Medicine

## 2014-02-25 ENCOUNTER — Encounter: Payer: Self-pay | Admitting: Family Medicine

## 2014-02-25 VITALS — BP 123/82 | HR 105 | Temp 97.5°F | Wt 245.0 lb

## 2014-02-25 DIAGNOSIS — N898 Other specified noninflammatory disorders of vagina: Secondary | ICD-10-CM

## 2014-02-25 DIAGNOSIS — Z0000 Encounter for general adult medical examination without abnormal findings: Secondary | ICD-10-CM

## 2014-02-25 DIAGNOSIS — Z124 Encounter for screening for malignant neoplasm of cervix: Secondary | ICD-10-CM

## 2014-02-25 DIAGNOSIS — Z01419 Encounter for gynecological examination (general) (routine) without abnormal findings: Secondary | ICD-10-CM

## 2014-02-25 DIAGNOSIS — Z113 Encounter for screening for infections with a predominantly sexual mode of transmission: Secondary | ICD-10-CM | POA: Insufficient documentation

## 2014-02-25 DIAGNOSIS — Z1151 Encounter for screening for human papillomavirus (HPV): Secondary | ICD-10-CM | POA: Insufficient documentation

## 2014-02-25 LAB — LIPID PANEL
Cholesterol: 218 mg/dL — ABNORMAL HIGH (ref 0–200)
HDL: 40 mg/dL (ref >39)
LDL Cholesterol: 151 mg/dL — ABNORMAL HIGH (ref 0–99)
Total CHOL/HDL Ratio: 5.5 Ratio
Triglycerides: 137 mg/dL (ref <150)
VLDL: 27 mg/dL (ref 0–40)

## 2014-02-25 LAB — POCT WET PREP (WET MOUNT): Clue Cells Wet Prep Whiff POC: NEGATIVE

## 2014-02-25 LAB — BASIC METABOLIC PANEL
BUN: 11 mg/dL (ref 6–23)
CO2: 25 mEq/L (ref 19–32)
Calcium: 9 mg/dL (ref 8.4–10.5)
Chloride: 105 mEq/L (ref 96–112)
Creat: 0.94 mg/dL (ref 0.50–1.10)
Glucose, Bld: 89 mg/dL (ref 70–99)
Potassium: 4.1 mEq/L (ref 3.5–5.3)
Sodium: 137 mEq/L (ref 135–145)

## 2014-02-25 LAB — CBC
HCT: 37.4 % (ref 36.0–46.0)
Hemoglobin: 12.7 g/dL (ref 12.0–15.0)
MCH: 24.7 pg — ABNORMAL LOW (ref 26.0–34.0)
MCHC: 34 g/dL (ref 30.0–36.0)
MCV: 72.6 fL — ABNORMAL LOW (ref 78.0–100.0)
Platelets: 247 10*3/uL (ref 150–400)
RBC: 5.15 MIL/uL — ABNORMAL HIGH (ref 3.87–5.11)
RDW: 16.8 % — ABNORMAL HIGH (ref 11.5–15.5)
WBC: 6.3 10*3/uL (ref 4.0–10.5)

## 2014-02-25 MED ORDER — FERROUS SULFATE 325 (65 FE) MG PO TABS: 325.0000 mg | ORAL_TABLET | Freq: Every day | ORAL | Status: DC

## 2014-02-25 MED ORDER — TRAMADOL HCL 50 MG PO TABS: 50.0000 mg | ORAL_TABLET | Freq: Three times a day (TID) | ORAL | Status: DC | PRN

## 2014-02-25 NOTE — Patient Instructions (Signed)
Get your mammogram scheduled.  I will let you know the results of today's tests.  Follow up soon to talk about your back.  Juniel Groene M. Harlan Vinal, M.D.

## 2014-02-25 NOTE — Progress Notes (Signed)
  Subjective:     Kimberly [name] is a 43 y.o. female and is here for a comprehensive physical exam. The patient reports no problems.  History   Social History  . Marital Status: Married    Spouse Name: N/A    Number of Children: N/A  . Years of Education: N/A   Occupational History  . Not on file.   Social History Main Topics  . Smoking status: Current Every Day Smoker -- 1.00 packs/day    Types: Cigarettes  . Smokeless tobacco: Not on file  . Alcohol Use: No  . Drug Use: No  . Sexual Activity: Not on file   Other Topics Concern  . Not on file   Social History Narrative  . No narrative on file   Health Maintenance  Topic Date Due  . Pap Smear  08/22/2009  . Influenza Vaccine  05/22/2014  . Tetanus/tdap  09/29/2018    The following portions of the patient's history were reviewed and updated as appropriate: allergies, current medications, past family history, past medical history, past social history, past surgical history and problem list.  Review of Systems Pertinent items are noted in HPI.   Objective:    BP 123/82  Pulse 105  Temp(Src) 97.5 F (36.4 C) (Oral)  Wt 245 lb (111.131 kg)  LMP 02/21/2014 General appearance: alert, cooperative and no distress Head: Normocephalic, without obvious abnormality, atraumatic Back: symmetric, no curvature. ROM normal. No CVA tenderness. Lungs: clear to auscultation bilaterally Heart: regular rate and rhythm, S1, S2 normal, no murmur, click, rub or gallop Abdomen: soft, non-tender; bowel sounds normal; no masses,  no organomegaly Pelvic: cervix normal in appearance, external genitalia normal, no adnexal masses or tenderness, uterus normal size, shape, and consistency and vagina normal without discharge Extremities: extremities normal, atraumatic, no cyanosis or edema Neurologic: Alert and oriented X 3, normal strength and tone. Normal symmetric reflexes. Normal coordination and gait    Assessment:    Healthy female  exam.       Plan:     See After Visit Summary for Counseling Recommendations

## 2014-02-25 NOTE — Addendum Note (Signed)
Addended by: Montez Morita on: 02/25/2014 11:31 AM   Modules accepted: Orders

## 2014-02-26 LAB — CERVICOVAGINAL ANCILLARY ONLY
Chlamydia: NEGATIVE
Neisseria Gonorrhea: NEGATIVE

## 2014-03-02 ENCOUNTER — Other Ambulatory Visit: Payer: Self-pay

## 2014-03-02 DIAGNOSIS — Z1231 Encounter for screening mammogram for malignant neoplasm of breast: Secondary | ICD-10-CM

## 2014-03-04 ENCOUNTER — Encounter: Payer: Self-pay | Admitting: Family Medicine

## 2014-03-05 ENCOUNTER — Encounter: Payer: Self-pay | Admitting: Family Medicine

## 2014-03-05 ENCOUNTER — Ambulatory Visit (INDEPENDENT_AMBULATORY_CARE_PROVIDER_SITE_OTHER): Payer: Medicare Other | Admitting: Family Medicine

## 2014-03-05 VITALS — BP 122/80 | HR 112 | Temp 98.2°F | Ht 69.0 in | Wt 252.0 lb

## 2014-03-05 DIAGNOSIS — M545 Low back pain, unspecified: Secondary | ICD-10-CM

## 2014-03-05 NOTE — Progress Notes (Signed)
Patient ID: Kimberly [name], female   DOB: 1970/10/31, 43 y.o.   MRN: [id number]    Subjective: HPI: Patient is a 43 y.o. female presenting to clinic today for follow back pain.   Back Pain Patient presents for evaluation of low back problems. Symptoms have been present for many years and include numbness in left leg to the knee, pain in lower back (burning and throbbing in character; 10/10 in severity) and stiffness in lower back and left hip. Initial inciting event: Injury when she was younger (age 46), fell down a hill. Symptoms are worse in the middle of the day and when she is active. Alleviating factors identifiable by the patient are sitting. Aggravating factors identifiable by the patient are standing and bending in all directions. Treatments initiated by the patient: Motrin, Tylenol. Previous lower back problems: Yes, persistent for 20+ years. Previous work up: Had MRI in 2007, then had repeat MRI scheduled, but did not keep appointment. Previous treatments: Medications. Was scheduled to see Dr. Frankey Poot at Neurosurgery in 2005. Did not see him at that time because she needed MRI and states she never followed up. No fevers, no urinary or fecal incontinence. She does have some falls at home. Her back pain does interfere with her daily life.  History Reviewed: Daily smoker. Health Maintenance: UTD  ROS: Please see HPI above.  Objective: Office vital signs reviewed. BP 122/80  Pulse 112  Temp(Src) 98.2 F (36.8 C) (Oral)  Ht 5\' 9"  (1.753 m)  Wt 252 lb (114.306 kg)  BMI 37.20 kg/m2  LMP 02/21/2014  Physical Examination:  General: Awake, alert. NAD. HEENT: Atraumatic, normocephalic. MMM.  Neck: No masses palpated. No LAD. Good ROM Back: No bony TTP. Mild paraspinal tenderness in lumbar region on left side. Tenderness over SI joint. Neg straight leg raise. Decreased ROM in all directions secondary to discomfort. Extremities: No edema Neuro: Grossly intact. Strength and sensation of  lower extremities grossly normal  Assessment: 43 y.o. female back pain  Plan: See Problem List and After Visit Summary

## 2014-03-05 NOTE — Assessment & Plan Note (Signed)
A: Acute on chronic back pain. No new injury other than increased falls recently. No fevers, incontinence or signs of bony tenderness. Most likely functional back pain, but with history of old injury, needs further assessment.  P: - Continue heat and/or ice - Ibuprofen 800mg  daily - MRI scheduled - If pain persists, consider referral back to Dr. Frankey Poot.

## 2014-03-05 NOTE — Patient Instructions (Signed)
We will work on getting your MRI scheduled.  Until then, work on strengthening your back. I have included some exercises to help with the sciatic nerve pain.  Use Motrin 600mg  at home to help with the inflammation. Use heating pad or ice pack as needed.  I will see you back after the MRI.  Ernan Runkles M. Genasis Zingale, M.D.

## 2014-03-09 ENCOUNTER — Other Ambulatory Visit: Payer: Self-pay | Admitting: *Deleted

## 2014-03-09 MED ORDER — QUETIAPINE FUMARATE 200 MG PO TABS: 200.0000 mg | ORAL_TABLET | Freq: Two times a day (BID) | ORAL | Status: DC

## 2014-03-16 ENCOUNTER — Ambulatory Visit (HOSPITAL_COMMUNITY): Admission: RE | Admit: 2014-03-16 | Payer: Medicare Other | Source: Ambulatory Visit

## 2014-03-16 ENCOUNTER — Ambulatory Visit (HOSPITAL_COMMUNITY)
Admission: RE | Admit: 2014-03-16 | Discharge: 2014-03-16 | Disposition: A | Discharge status: Home / Self Care | Payer: Medicare Other | Source: Ambulatory Visit | Attending: Family Medicine | Admitting: Family Medicine

## 2014-03-16 DIAGNOSIS — M47817 Spondylosis without myelopathy or radiculopathy, lumbosacral region: Secondary | ICD-10-CM | POA: Insufficient documentation

## 2014-03-16 DIAGNOSIS — M545 Low back pain, unspecified: Secondary | ICD-10-CM

## 2014-03-16 DIAGNOSIS — M5126 Other intervertebral disc displacement, lumbar region: Secondary | ICD-10-CM | POA: Insufficient documentation

## 2014-03-16 DIAGNOSIS — Z9181 History of falling: Secondary | ICD-10-CM | POA: Insufficient documentation

## 2014-03-17 ENCOUNTER — Telehealth: Payer: Self-pay | Admitting: Family Medicine

## 2014-03-17 NOTE — Telephone Encounter (Signed)
Will you please let Kimberly [name] know that her MRI only showed some worsening arthritis of her back. Nothing new or concerning. She should continue with the plan we have discussed in the past with staying active, use heat as needed and take antiinflammatory medications. I will see her back as needed.  Thanks, [employer] Hairford, M.D.

## 2014-03-17 NOTE — Telephone Encounter (Signed)
Pt is aware of this. Carole Doner,CMA  

## 2014-03-19 ENCOUNTER — Ambulatory Visit
Admission: RE | Admit: 2014-03-19 | Discharge: 2014-03-19 | Disposition: A | Discharge status: Home / Self Care | Payer: Medicare Other | Source: Ambulatory Visit

## 2014-03-19 DIAGNOSIS — Z1231 Encounter for screening mammogram for malignant neoplasm of breast: Secondary | ICD-10-CM

## 2014-03-21 ENCOUNTER — Other Ambulatory Visit: Payer: Self-pay | Admitting: Family Medicine

## 2014-03-22 ENCOUNTER — Other Ambulatory Visit: Payer: Self-pay | Admitting: Family Medicine

## 2014-03-22 ENCOUNTER — Telehealth: Payer: Self-pay | Admitting: Family Medicine

## 2014-03-22 DIAGNOSIS — R928 Other abnormal and inconclusive findings on diagnostic imaging of breast: Secondary | ICD-10-CM

## 2014-03-22 NOTE — Telephone Encounter (Signed)
Please let patient know her Rx has been printed for Tramadol and she can pick it up at front desk.  Amber M. Hairford, M.D.

## 2014-03-22 NOTE — Telephone Encounter (Signed)
Pt informed that Rx is ready for pick up.  Derl Barrow, RN

## 2014-04-01 ENCOUNTER — Other Ambulatory Visit: Payer: Self-pay | Admitting: Family Medicine

## 2014-04-01 ENCOUNTER — Ambulatory Visit
Admission: RE | Admit: 2014-04-01 | Discharge: 2014-04-01 | Disposition: A | Discharge status: Home / Self Care | Payer: Medicare Other | Source: Ambulatory Visit | Attending: Family Medicine | Admitting: Family Medicine

## 2014-04-01 ENCOUNTER — Encounter (INDEPENDENT_AMBULATORY_CARE_PROVIDER_SITE_OTHER): Payer: Self-pay

## 2014-04-01 DIAGNOSIS — R928 Other abnormal and inconclusive findings on diagnostic imaging of breast: Secondary | ICD-10-CM

## 2014-04-01 HISTORY — PX: BREAST BIOPSY: SHX20

## 2014-04-02 ENCOUNTER — Other Ambulatory Visit: Payer: Self-pay | Admitting: Family Medicine

## 2014-04-02 DIAGNOSIS — C50919 Malignant neoplasm of unspecified site of unspecified female breast: Secondary | ICD-10-CM

## 2014-04-06 ENCOUNTER — Ambulatory Visit (INDEPENDENT_AMBULATORY_CARE_PROVIDER_SITE_OTHER): Payer: Medicare Other | Admitting: General Surgery

## 2014-04-06 ENCOUNTER — Encounter (INDEPENDENT_AMBULATORY_CARE_PROVIDER_SITE_OTHER): Payer: Self-pay | Admitting: General Surgery

## 2014-04-06 VITALS — BP 118/76 | HR 88 | Temp 97.6°F | Resp 16 | Ht 69.0 in | Wt 253.4 lb

## 2014-04-06 DIAGNOSIS — C50919 Malignant neoplasm of unspecified site of unspecified female breast: Secondary | ICD-10-CM

## 2014-04-06 DIAGNOSIS — C50912 Malignant neoplasm of unspecified site of left female breast: Secondary | ICD-10-CM

## 2014-04-06 DIAGNOSIS — C50419 Malignant neoplasm of upper-outer quadrant of unspecified female breast: Secondary | ICD-10-CM

## 2014-04-06 NOTE — Progress Notes (Signed)
Patient ID: Kimberly [name], female   DOB: June 01, 1971, 43 y.o.   MRN: [id number]  Chief Complaint  Patient presents with  . Breast Cancer    new pt- eval lt breast    HPI Kimberly [name] is a 43 y.o. female.  Referred by Dr Luberta Robertson HPI 50 yof who got her first mm as "god told her to".  She had no complaints referable to either breast and no past history.  There is no family history present.  Her breast density is c.  Mm of the left breast shows an irregular mass located in uoq with a possible satellite near this.  US shows a 1.4x1x.6 cm mass and a subtle small 4 mm mass located 61mm anterior and inferior.  She underwent biopsy of both of these lesions.  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  The other biopsy which is read as concordant that is benign breast tissue.  This appears to be grade I-II.  She comes in today to discuss her options.  Past Medical History  Diagnosis Date  . Anemia   . Arthritis   . Schizophrenia   . Bipolar 1 disorder   . Diabetes mellitus without complication     gestational  . Blood transfusion without reported diagnosis 2007  . Cancer     eval  lt breast cancer  . Heart murmur     Past Surgical History  Procedure Laterality Date  . Dilation and curettage of uterus    . Tonsillectomy and adenoidectomy      History reviewed. No pertinent family history.  Social History History  Substance Use Topics  . Smoking status: Current Every Day Smoker -- 1.00 packs/day    Types: Cigarettes  . Smokeless tobacco: Not on file  . Alcohol Use: No    No Known Allergies  Current Outpatient Prescriptions  Medication Sig Dispense Refill  . acetaminophen (TYLENOL) 500 MG tablet Take 1,000 mg by mouth every 6 (six) hours as needed. Pain      . citalopram (CELEXA) 40 MG tablet Take 0.5-1 tablets (20-40 mg total) by mouth 2 (two) times daily. Takes 1 tab qam and 1/2 tab qhs- Weaning off, per mental health  45 tablet  1  . clonazePAM (KLONOPIN)  1 MG tablet daily.      . ferrous sulfate 325 (65 FE) MG tablet Take 1 tablet (325 mg total) by mouth daily with breakfast.  90 tablet  3  . lamoTRIgine (LAMICTAL) 200 MG tablet TAKE 1 TABLET BY MOUTH TWICE DAILY  60 tablet  2  . meloxicam (MOBIC) 15 MG tablet TAKE 1 TABLET BY MOUTH EVERY DAY  30 tablet  5  . QUEtiapine (SEROQUEL) 200 MG tablet Take 1 tablet (200 mg total) by mouth 2 (two) times daily.  60 tablet  5  . risperiDONE (RISPERDAL) 0.25 MG tablet Take by mouth 2 (two) times daily. Unsure of dose. Given by mental health.      . traMADol (ULTRAM) 50 MG tablet TAKE 1 TABLET BY MOUTH EVERY 8 HOURS AS NEEDED  30 tablet  2  . zolpidem (AMBIEN) 10 MG tablet daily.       No current facility-administered medications for this visit.    Review of Systems Review of Systems  Constitutional: Positive for unexpected weight change. Negative for fever and chills.  HENT: Negative for congestion, hearing loss, sore throat, trouble swallowing and voice change.   Eyes: Negative for visual disturbance.  Respiratory: Negative for cough and  wheezing.   Cardiovascular: Negative for chest pain, palpitations and leg swelling.  Gastrointestinal: Negative for nausea, vomiting, abdominal pain, diarrhea, constipation, blood in stool, abdominal distention and anal bleeding.  Genitourinary: Negative for hematuria, vaginal bleeding and difficulty urinating.  Musculoskeletal: Negative for arthralgias.  Skin: Negative for rash and wound.  Neurological: Negative for seizures, syncope and headaches.  Hematological: Negative for adenopathy. Does not bruise/bleed easily.  Psychiatric/Behavioral: Negative for confusion.    Blood pressure 118/76, pulse 88, temperature 97.6 F (36.4 C), temperature source Temporal, resp. rate 16, height 5\' 9"  (1.753 m), weight 253 lb 6.4 oz (114.941 kg).  Physical Exam Physical Exam  Vitals reviewed. Constitutional: She appears well-developed and well-nourished.  Neck: Neck  supple.  Cardiovascular: Normal rate, regular rhythm and normal heart sounds.   Pulmonary/Chest: Effort normal and breath sounds normal. She has no wheezes. She has no rales. Right breast exhibits no inverted nipple, no mass, no nipple discharge, no skin change and no tenderness. Left breast exhibits mass. Left breast exhibits no inverted nipple, no nipple discharge, no skin change and no tenderness.    Lymphadenopathy:    She has no cervical adenopathy.    She has no axillary adenopathy.       Right: No supraclavicular adenopathy present.       Left: No supraclavicular adenopathy present.    Data Reviewed EXAM:  DIGITAL DIAGNOSTIC LEFT BREAST MAMMOGRAM  ULTRASOUND LEFT BREAST  COMPARISON: Previous examinations.  ACR Breast Density Category c: The breast tissue is heterogeneously  dense, which may obscure small masses.  FINDINGS:  Spot compression views of the left breast demonstrate an irregular  mass located within the upper outer quadrant left breast 1 o'clock  position. There may be a subtle small satellite nodule located  approximately 1 cm anterior to this mass.  On physical exam, there is a firm palpable mass located within the  left breast at 1 o'clock position 8 cm from the nipple which by  physical examination measures approximately 1 cm in size. There is  no palpable left axillary adenopathy.  Ultrasound is performed, showing an irregular hypoechoic mass with  shadowing located within the left breast at 1 o'clock position 8 cm  from the nipple corresponding to the palpable finding. This measures  1.4 x 1.0 x 0.6 cm in size and is worrisome for invasive mammary  carcinoma. There is a subtle small (4 mm) nodule located 9 mm  anterior and inferior to this larger mass which may represent a  small satellite nodule. I have discussed ultrasound-guided core  biopsy of the larger mass and this tiny satellite nodule with the  patient. She would like to proceed with this. This  will be performed  and reported separately.  Ultrasound of the left axilla demonstrates normal axillary contents  and no evidence for adenopathy.  IMPRESSION:  1.4 cm irregular mass located within the left breast at the 1  o'clock position 8 cm from the nipple suspicious for invasive  mammary carcinoma. There is a second small (4 mm) subtle mass  located 1 cm anterior and inferior to this larger mass which may  represent a small satellite nodule. Ultrasound-guided core biopsy is  recommended and will be performed and reported separately.   Assessment    Clinical stage I left breast cancer     Plan    We discussed genetic testing due to age, I think mr would be reasonable also and we discussed indications/possibilities.  Remainder of path also pending and  many of treatment decisions will be based on that. I think with information right now surgery would be first but will need to have the rest of this information prior to proceeding We discussed the staging and pathophysiology of breast cancer. We discussed all of the different options for treatment for breast cancer including surgery, chemotherapy, radiation therapy, Herceptin, and antiestrogen therapy.   We discussed a sentinel lymph node biopsy as she does not appear to having lymph node involvement right now. We discussed the performance of that with injection of radioactive tracer.  We discussed the options for treatment of the breast cancer which included lumpectomy versus a mastectomy. We discussed the performance of the lumpectomy with a seed placement. We discussed the mastectomy and the postoperative care for that as well. We discussed reconstruction as an option. We discussed that there is no difference in her survival whether she undergoes lumpectomy with radiation therapy or antiestrogen therapy versus a mastectomy.  She understands her further therapy will be based on what her stages at the time of her operation.           Kimberly [name] 04/06/2014, 5:21 PM

## 2014-04-08 ENCOUNTER — Ambulatory Visit
Admission: RE | Admit: 2014-04-08 | Discharge: 2014-04-08 | Disposition: A | Discharge status: Home / Self Care | Payer: Medicare Other | Source: Ambulatory Visit | Attending: Family Medicine | Admitting: Family Medicine

## 2014-04-08 DIAGNOSIS — C50919 Malignant neoplasm of unspecified site of unspecified female breast: Secondary | ICD-10-CM

## 2014-04-08 MED ORDER — GADOBENATE DIMEGLUMINE 529 MG/ML IV SOLN
20.0000 mL | Freq: Once | INTRAVENOUS | Status: AC | PRN
  Administered 2014-04-08: 20 mL via INTRAVENOUS

## 2014-04-09 ENCOUNTER — Other Ambulatory Visit: Payer: Self-pay | Admitting: Family Medicine

## 2014-04-09 DIAGNOSIS — R928 Other abnormal and inconclusive findings on diagnostic imaging of breast: Secondary | ICD-10-CM

## 2014-04-14 ENCOUNTER — Ambulatory Visit (HOSPITAL_BASED_OUTPATIENT_CLINIC_OR_DEPARTMENT_OTHER): Payer: Medicare Other | Admitting: Genetic Counselor

## 2014-04-14 ENCOUNTER — Other Ambulatory Visit: Payer: Medicare Other

## 2014-04-14 DIAGNOSIS — IMO0002 Reserved for concepts with insufficient information to code with codable children: Secondary | ICD-10-CM

## 2014-04-14 DIAGNOSIS — C50419 Malignant neoplasm of upper-outer quadrant of unspecified female breast: Secondary | ICD-10-CM

## 2014-04-14 DIAGNOSIS — C50919 Malignant neoplasm of unspecified site of unspecified female breast: Secondary | ICD-10-CM | POA: Insufficient documentation

## 2014-04-14 DIAGNOSIS — C50912 Malignant neoplasm of unspecified site of left female breast: Secondary | ICD-10-CM | POA: Insufficient documentation

## 2014-04-14 NOTE — Progress Notes (Signed)
HISTORY OF PRESENT ILLNESS: Dr. Sheral Apley requested a cancer genetics consultation for Kimberly [name], a 43 y.o. female, due to a personal history of breast cancer.  Kimberly [name] presents to clinic today to discuss the possibility of a hereditary predisposition to cancer, genetic testing, and to further clarify her future cancer risks, as well as potential cancer risk for family members. Kimberly [name] was diagnosed with left breast IDC (ER+/PR+/Her2NEU-) at age 25. She has not history of other cancer. She is still deciding on a treatment plan and would potentially like to use genetic test results for surgical decisions.   Past Medical History  Diagnosis Date   Anemia    Arthritis    Schizophrenia    Bipolar 1 disorder    Diabetes mellitus without complication     gestational   Blood transfusion without reported diagnosis 2007   Cancer     eval  lt breast cancer   Heart murmur     Past Surgical History  Procedure Laterality Date   Dilation and curettage of uterus     Tonsillectomy and adenoidectomy     History   Social History   Marital Status: Married    Spouse Name: N/A    Number of Children: N/A   Years of Education: N/A   Social History Main Topics   Smoking status: Current Every Day Smoker -- 1.00 packs/day    Types: Cigarettes   Smokeless tobacco: Not on file   Alcohol Use: No   Drug Use: No   Sexual Activity: Not on file   Other Topics Concern   Not on file   Social History Narrative   No narrative on file     FAMILY HISTORY:  During the visit, a 4-generation pedigree was obtained. Kimberly [name] has a very large maternal and paternal family history. There are many females on both sides of the family. No other family member, including siblings, has had a diagnosis of cancer. Kimberly [name] ancestry is of African American descent. There is no known Jewish ancestry or consanguinity.  GENETIC COUNSELING ASSESSMENT: Kimberly [name] is a 43 y.o. female with a personal  history of breast cancer at age 50, which is suggestive of a hereditary predisposition to cancer due to her young age of diagnosis. We, therefore, discussed and recommended the following at today's visit.   DISCUSSION: We reviewed the characteristics, features and inheritance patterns of hereditary cancer syndromes. We also discussed genetic testing, including the appropriate family members to test, the process of testing, insurance coverage and turn-around-time for results. We discussed the implications of a negative, positive and/or variant of uncertain significant result. We recommended Kimberly [name] pursue genetic testing for the BRCA1 and BRCA2 genes. If her genetic test for BRCA is negative, we recommend she pursue reflex genetic testing for the BreastNext gene panel at [facility].   PLAN: Based on our above recommendation, Kimberly [name] wished to pursue genetic testing and the blood sample was drawn and will be sent to [facility] for analysis. Results should be available within approximately 2-3 weeks time for BRCA, at which point they will be disclosed by telephone to Kimberly [name], as will any additional recommendations warranted by these results. If reflex genetic testing is pursued, these results will take an additional 2-3 weeks and we will call her once these are available.   We also encouraged Kimberly [name]  to remain in contact with cancer genetics annually so that we can continuously update the family history and inform  her of any changes in cancer genetics and testing that may be of benefit for this family. Kimberly [name] questions were answered to her satisfaction today. Our contact information was provided should additional questions or concerns arise.   Thank you for the referral and allowing [name] to share in the care of your patient.   The patient was seen for a total of 40 minutes in face-to-face genetic counseling. This patient was discussed with Dr. Jana Hakim who agrees with the  above.  _______________________________________________________________________  For Office Staff:  Number of people involved in session: 3  Was an Intern/ student involved with case: no

## 2014-04-19 ENCOUNTER — Ambulatory Visit
Admission: RE | Admit: 2014-04-19 | Discharge: 2014-04-19 | Disposition: A | Discharge status: Home / Self Care | Payer: Medicare Other | Source: Ambulatory Visit | Attending: Family Medicine | Admitting: Family Medicine

## 2014-04-19 ENCOUNTER — Ambulatory Visit
Admission: RE | Admit: 2014-04-19 | Discharge: 2014-04-19 | Disposition: A | Discharge status: Home / Self Care | Payer: Medicare Other | Source: Ambulatory Visit | Attending: General Surgery | Admitting: General Surgery

## 2014-04-19 ENCOUNTER — Other Ambulatory Visit: Payer: Medicare Other

## 2014-04-19 ENCOUNTER — Other Ambulatory Visit: Payer: Self-pay | Admitting: Family Medicine

## 2014-04-19 DIAGNOSIS — R928 Other abnormal and inconclusive findings on diagnostic imaging of breast: Secondary | ICD-10-CM

## 2014-04-19 HISTORY — PX: BREAST BIOPSY: SHX20

## 2014-04-20 ENCOUNTER — Other Ambulatory Visit: Payer: Medicare Other

## 2014-04-20 ENCOUNTER — Ambulatory Visit (INDEPENDENT_AMBULATORY_CARE_PROVIDER_SITE_OTHER): Payer: Medicare Other | Admitting: General Surgery

## 2014-04-20 ENCOUNTER — Encounter (INDEPENDENT_AMBULATORY_CARE_PROVIDER_SITE_OTHER): Payer: Self-pay | Admitting: General Surgery

## 2014-04-20 ENCOUNTER — Ambulatory Visit
Admission: RE | Admit: 2014-04-20 | Discharge: 2014-04-20 | Disposition: A | Discharge status: Home / Self Care | Payer: Medicare Other | Source: Ambulatory Visit | Attending: General Surgery | Admitting: General Surgery

## 2014-04-20 VITALS — BP 138/80 | HR 84 | Temp 97.2°F | Ht 68.0 in | Wt 257.0 lb

## 2014-04-20 DIAGNOSIS — R928 Other abnormal and inconclusive findings on diagnostic imaging of breast: Secondary | ICD-10-CM

## 2014-04-20 DIAGNOSIS — C50419 Malignant neoplasm of upper-outer quadrant of unspecified female breast: Secondary | ICD-10-CM

## 2014-04-20 DIAGNOSIS — C50412 Malignant neoplasm of upper-outer quadrant of left female breast: Secondary | ICD-10-CM

## 2014-04-20 NOTE — Progress Notes (Signed)
Subjective:     Patient ID: Kimberly [name], female   DOB: 1971-03-03, 43 y.o.   MRN: [id number]  HPI 43 yof who I saw on 6/16 for new diagnosis of a left breast cancer.  Her prognostic panel is 83% er positive, 66% pr positive, Ki is 14%, her2 not amplified. She also had abnormality on mr on right breast with [name] correlate that has undergone biopsy and is ectatic ducts.  This is concordant with US findings but discordant with mr findings and radiology is recommending excisional biopsy of this area.  She also has nodule and pain in right axilla that has been present for some time.  She had genetics appt on 24th and has testing pending right now. There have been no other changes since our last visit.  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  I also discussed a seed guided right breast excisional biopsy for discordance on path and mri.  We discussed this could be cancer and need more therapy.  I also discussed pathology and prognostic panel which was not back last time I saw her.  We discussed again all of the different options for treatment for breast cancer including surgery, chemotherapy, radiation therapy, Herceptin, and antiestrogen therapy.   We discussed a sentinel lymph node biopsy as she does not appear to having lymph node involvement right now. We discussed the performance of that with injection of radioactive tracer.  We discussed that she would have an incision underneath her axillary hairline. We discussed that there is a chance of having a positive node with a sentinel lymph node biopsy and we will await the permanent pathology to make any other first further decisions in terms of her treatment. One of these options might be to return to the operating room to perform an axillary lymph node dissection. We discussed up to a 5% risk lifetime of chronic shoulder pain as well as lymphedema associated with a sentinel lymph node biopsy.  We discussed the options for treatment of the breast cancer again. which included lumpectomy versus a mastectomy. I think she is a candidate for lumpectomy after all data is back now. We discussed the performance of the lumpectomy with seed placement. We discussed a 5-10% chance of a positive margin requiring reexcision in the operating room. We also discussed that she will need radiation therapy if she undergoes lumpectomy. We discussed the mastectomy and the postoperative care for that as well. We discussed that there is no difference in her survival whether she undergoes lumpectomy with radiation therapy or antiestrogen therapy versus a mastectomy.  We discussed the risks of operation including bleeding, infection, possible reoperation. She understands her further therapy will be based on what her stages at the time of her operation. We will plan on sending oncotype to determine need for chemotherapy and I discussed this also.

## 2014-04-23 ENCOUNTER — Other Ambulatory Visit (INDEPENDENT_AMBULATORY_CARE_PROVIDER_SITE_OTHER): Payer: Self-pay | Admitting: General Surgery

## 2014-04-23 DIAGNOSIS — C50412 Malignant neoplasm of upper-outer quadrant of left female breast: Secondary | ICD-10-CM

## 2014-04-26 ENCOUNTER — Other Ambulatory Visit (INDEPENDENT_AMBULATORY_CARE_PROVIDER_SITE_OTHER): Payer: Self-pay | Admitting: General Surgery

## 2014-04-26 DIAGNOSIS — C50412 Malignant neoplasm of upper-outer quadrant of left female breast: Secondary | ICD-10-CM

## 2014-04-29 ENCOUNTER — Ambulatory Visit
Admission: RE | Admit: 2014-04-29 | Discharge: 2014-04-29 | Disposition: A | Discharge status: Home / Self Care | Payer: Medicare Other | Source: Ambulatory Visit | Attending: General Surgery | Admitting: General Surgery

## 2014-04-29 ENCOUNTER — Encounter (HOSPITAL_BASED_OUTPATIENT_CLINIC_OR_DEPARTMENT_OTHER): Payer: Self-pay | Admitting: *Deleted

## 2014-04-29 ENCOUNTER — Encounter (INDEPENDENT_AMBULATORY_CARE_PROVIDER_SITE_OTHER): Payer: Self-pay

## 2014-04-29 DIAGNOSIS — C50412 Malignant neoplasm of upper-outer quadrant of left female breast: Secondary | ICD-10-CM

## 2014-04-29 NOTE — Progress Notes (Signed)
To come in for labs -said seeds hurt To bring all meds and overnight bag

## 2014-04-30 ENCOUNTER — Encounter (HOSPITAL_BASED_OUTPATIENT_CLINIC_OR_DEPARTMENT_OTHER)
Admission: RE | Admit: 2014-04-30 | Discharge: 2014-04-30 | Disposition: A | Discharge status: Home / Self Care | Payer: Medicare Other | Source: Ambulatory Visit | Attending: General Surgery | Admitting: General Surgery

## 2014-04-30 ENCOUNTER — Encounter: Payer: Self-pay | Admitting: Genetic Counselor

## 2014-04-30 DIAGNOSIS — C50912 Malignant neoplasm of unspecified site of left female breast: Secondary | ICD-10-CM

## 2014-04-30 LAB — COMPREHENSIVE METABOLIC PANEL
ALT: 10 U/L (ref 0–35)
AST: 15 U/L (ref 0–37)
Albumin: 3.8 g/dL (ref 3.5–5.2)
Alkaline Phosphatase: 77 U/L (ref 39–117)
Anion gap: 14 (ref 5–15)
BUN: 14 mg/dL (ref 6–23)
CO2: 25 mEq/L (ref 19–32)
Calcium: 9.3 mg/dL (ref 8.4–10.5)
Chloride: 101 mEq/L (ref 96–112)
Creatinine, Ser: 1.02 mg/dL (ref 0.50–1.10)
GFR calc Af Amer: 77 mL/min — ABNORMAL LOW (ref >90)
GFR calc non Af Amer: 67 mL/min — ABNORMAL LOW (ref >90)
Glucose, Bld: 79 mg/dL (ref 70–99)
Potassium: 4.4 mEq/L (ref 3.7–5.3)
Sodium: 140 mEq/L (ref 137–147)
Total Bilirubin: 0.2 mg/dL — ABNORMAL LOW (ref 0.3–1.2)
Total Protein: 7.3 g/dL (ref 6.0–8.3)

## 2014-04-30 LAB — CBC WITH DIFFERENTIAL/PLATELET
Basophils Absolute: 0.1 10*3/uL (ref 0.0–0.1)
Basophils Relative: 1 % (ref 0–1)
Eosinophils Absolute: 0.1 10*3/uL (ref 0.0–0.7)
Eosinophils Relative: 2 % (ref 0–5)
HCT: 41.7 % (ref 36.0–46.0)
Hemoglobin: 13.7 g/dL (ref 12.0–15.0)
Lymphocytes Relative: 42 % (ref 12–46)
Lymphs Abs: 3 10*3/uL (ref 0.7–4.0)
MCH: 25.4 pg — ABNORMAL LOW (ref 26.0–34.0)
MCHC: 32.9 g/dL (ref 30.0–36.0)
MCV: 77.2 fL — ABNORMAL LOW (ref 78.0–100.0)
Monocytes Absolute: 0.6 10*3/uL (ref 0.1–1.0)
Monocytes Relative: 9 % (ref 3–12)
Neutro Abs: 3.2 10*3/uL (ref 1.7–7.7)
Neutrophils Relative %: 46 % (ref 43–77)
Platelets: 231 10*3/uL (ref 150–400)
RBC: 5.4 MIL/uL — ABNORMAL HIGH (ref 3.87–5.11)
RDW: 15.6 % — ABNORMAL HIGH (ref 11.5–15.5)
WBC: 7 10*3/uL (ref 4.0–10.5)

## 2014-04-30 LAB — CANCER ANTIGEN 27.29: CA 27.29: 28 U/mL (ref 0–39)

## 2014-04-30 NOTE — Progress Notes (Signed)
Kimberly [name] recently had cancer genetic counseling at [facility] on April 14, 2014. At that time, it was recommended she pursue genetic testing. Her BRCA1 and BRCA2 gene test, which was performed at [facility], has returned and is negative for mutations. These results were disclosed to her today. Per her request, reflex testing for the BreastNext gene panel at [facility] was initiated. Results for the gene panel should be available in 2-3 more weeks and we will contact her to discuss these results and recommendations warranted by these results.

## 2014-05-03 ENCOUNTER — Ambulatory Visit (HOSPITAL_BASED_OUTPATIENT_CLINIC_OR_DEPARTMENT_OTHER)
Admission: RE | Admit: 2014-05-03 | Discharge: 2014-05-04 | Disposition: A | Discharge status: Home / Self Care | Payer: Medicare Other | Source: Ambulatory Visit | Attending: General Surgery | Admitting: General Surgery

## 2014-05-03 ENCOUNTER — Encounter (HOSPITAL_BASED_OUTPATIENT_CLINIC_OR_DEPARTMENT_OTHER): Payer: Medicare Other | Admitting: Certified Registered"

## 2014-05-03 ENCOUNTER — Ambulatory Visit (HOSPITAL_BASED_OUTPATIENT_CLINIC_OR_DEPARTMENT_OTHER): Payer: Medicare Other | Admitting: Certified Registered"

## 2014-05-03 ENCOUNTER — Ambulatory Visit (HOSPITAL_COMMUNITY)
Admission: RE | Admit: 2014-05-03 | Discharge: 2014-05-03 | Disposition: A | Discharge status: Home / Self Care | Payer: Medicare Other | Source: Ambulatory Visit | Attending: General Surgery | Admitting: General Surgery

## 2014-05-03 ENCOUNTER — Ambulatory Visit
Admission: RE | Admit: 2014-05-03 | Discharge: 2014-05-03 | Disposition: A | Discharge status: Home / Self Care | Payer: Medicare Other | Source: Ambulatory Visit | Attending: General Surgery | Admitting: General Surgery

## 2014-05-03 ENCOUNTER — Encounter (HOSPITAL_BASED_OUTPATIENT_CLINIC_OR_DEPARTMENT_OTHER)
Admission: RE | Disposition: A | Discharge status: Home / Self Care | Payer: Self-pay | Source: Ambulatory Visit | Attending: General Surgery

## 2014-05-03 ENCOUNTER — Encounter (HOSPITAL_BASED_OUTPATIENT_CLINIC_OR_DEPARTMENT_OTHER): Payer: Self-pay | Admitting: Certified Registered"

## 2014-05-03 DIAGNOSIS — C50919 Malignant neoplasm of unspecified site of unspecified female breast: Secondary | ICD-10-CM | POA: Diagnosis not present

## 2014-05-03 DIAGNOSIS — F209 Schizophrenia, unspecified: Secondary | ICD-10-CM | POA: Diagnosis not present

## 2014-05-03 DIAGNOSIS — R920 Mammographic microcalcification found on diagnostic imaging of breast: Secondary | ICD-10-CM

## 2014-05-03 DIAGNOSIS — D249 Benign neoplasm of unspecified breast: Secondary | ICD-10-CM | POA: Insufficient documentation

## 2014-05-03 DIAGNOSIS — Q838 Other congenital malformations of breast: Secondary | ICD-10-CM | POA: Diagnosis not present

## 2014-05-03 DIAGNOSIS — C773 Secondary and unspecified malignant neoplasm of axilla and upper limb lymph nodes: Secondary | ICD-10-CM | POA: Diagnosis not present

## 2014-05-03 DIAGNOSIS — Z6839 Body mass index (BMI) 39.0-39.9, adult: Secondary | ICD-10-CM | POA: Diagnosis not present

## 2014-05-03 DIAGNOSIS — D649 Anemia, unspecified: Secondary | ICD-10-CM | POA: Diagnosis not present

## 2014-05-03 DIAGNOSIS — R928 Other abnormal and inconclusive findings on diagnostic imaging of breast: Secondary | ICD-10-CM

## 2014-05-03 DIAGNOSIS — F17200 Nicotine dependence, unspecified, uncomplicated: Secondary | ICD-10-CM | POA: Diagnosis not present

## 2014-05-03 DIAGNOSIS — C50912 Malignant neoplasm of unspecified site of left female breast: Secondary | ICD-10-CM | POA: Diagnosis present

## 2014-05-03 DIAGNOSIS — C50412 Malignant neoplasm of upper-outer quadrant of left female breast: Secondary | ICD-10-CM

## 2014-05-03 DIAGNOSIS — F319 Bipolar disorder, unspecified: Secondary | ICD-10-CM | POA: Insufficient documentation

## 2014-05-03 DIAGNOSIS — D0590 Unspecified type of carcinoma in situ of unspecified breast: Secondary | ICD-10-CM

## 2014-05-03 DIAGNOSIS — Z853 Personal history of malignant neoplasm of breast: Secondary | ICD-10-CM

## 2014-05-03 DIAGNOSIS — N6019 Diffuse cystic mastopathy of unspecified breast: Secondary | ICD-10-CM

## 2014-05-03 HISTORY — PX: BREAST LUMPECTOMY WITH AXILLARY LYMPH NODE DISSECTION: SHX5756

## 2014-05-03 LAB — GLUCOSE, CAPILLARY
Glucose-Capillary: 92 mg/dL (ref 70–99)
Glucose-Capillary: 109 mg/dL — ABNORMAL HIGH (ref 70–99)

## 2014-05-03 SURGERY — RADIOACTIVE SEED GUIDED PARTIAL MASTECTOMY WITH AXILLARY SENTINEL LYMPH NODE BIOPSY
Anesthesia: Regional | Site: Breast | Laterality: Right

## 2014-05-03 MED ORDER — BUPIVACAINE-EPINEPHRINE (PF) 0.5% -1:200000 IJ SOLN
INTRAMUSCULAR | Status: DC | PRN
  Administered 2014-05-03: 30 mL

## 2014-05-03 MED ORDER — MORPHINE SULFATE 2 MG/ML IJ SOLN
2.0000 mg | INTRAMUSCULAR | Status: DC | PRN
  Administered 2014-05-03 – 2014-05-04 (×2): 2 mg via INTRAVENOUS
  Filled 2014-05-03 (×2): qty 1

## 2014-05-03 MED ORDER — DEXAMETHASONE SODIUM PHOSPHATE 4 MG/ML IJ SOLN
INTRAMUSCULAR | Status: DC | PRN
  Administered 2014-05-03: 4 mg via INTRAVENOUS

## 2014-05-03 MED ORDER — FENTANYL CITRATE 0.05 MG/ML IJ SOLN
INTRAMUSCULAR | Status: AC
  Filled 2014-05-03: qty 8

## 2014-05-03 MED ORDER — CITALOPRAM HYDROBROMIDE 20 MG PO TABS
20.0000 mg | ORAL_TABLET | Freq: Two times a day (BID) | ORAL | Status: DC
  Administered 2014-05-03: 40 mg via ORAL

## 2014-05-03 MED ORDER — ONDANSETRON HCL 4 MG/2ML IJ SOLN: 4.0000 mg | Freq: Four times a day (QID) | INTRAMUSCULAR | Status: DC | PRN

## 2014-05-03 MED ORDER — FENTANYL CITRATE 0.05 MG/ML IJ SOLN
50.0000 ug | INTRAMUSCULAR | Status: DC | PRN
  Administered 2014-05-03 (×2): 50 ug via INTRAVENOUS

## 2014-05-03 MED ORDER — TRAMADOL HCL 50 MG PO TABS: 50.0000 mg | ORAL_TABLET | Freq: Four times a day (QID) | ORAL | Status: DC | PRN

## 2014-05-03 MED ORDER — PROPOFOL 10 MG/ML IV BOLUS
INTRAVENOUS | Status: DC | PRN
  Administered 2014-05-03: 150 mg via INTRAVENOUS

## 2014-05-03 MED ORDER — OXYCODONE-ACETAMINOPHEN 10-325 MG PO TABS: 1.0000 | ORAL_TABLET | Freq: Four times a day (QID) | ORAL | Status: DC | PRN

## 2014-05-03 MED ORDER — TECHNETIUM TC 99M SULFUR COLLOID FILTERED
1.0000 | Freq: Once | INTRAVENOUS | Status: AC | PRN
  Administered 2014-05-03: 1 via INTRADERMAL

## 2014-05-03 MED ORDER — OXYCODONE HCL 5 MG PO TABS
ORAL_TABLET | ORAL | Status: AC
  Filled 2014-05-03: qty 1

## 2014-05-03 MED ORDER — FENTANYL CITRATE 0.05 MG/ML IJ SOLN
INTRAMUSCULAR | Status: DC | PRN
  Administered 2014-05-03 (×8): 50 ug via INTRAVENOUS

## 2014-05-03 MED ORDER — HYDROMORPHONE HCL PF 1 MG/ML IJ SOLN
INTRAMUSCULAR | Status: AC
  Filled 2014-05-03: qty 1

## 2014-05-03 MED ORDER — SODIUM CHLORIDE 0.9 % IV SOLN
INTRAVENOUS | Status: DC
  Administered 2014-05-03: 15:00:00 via INTRAVENOUS

## 2014-05-03 MED ORDER — ONDANSETRON HCL 4 MG/2ML IJ SOLN
INTRAMUSCULAR | Status: DC | PRN
  Administered 2014-05-03: 4 mg via INTRAVENOUS

## 2014-05-03 MED ORDER — HYDROMORPHONE HCL PF 1 MG/ML IJ SOLN
0.2500 mg | INTRAMUSCULAR | Status: DC | PRN
  Administered 2014-05-03 (×4): 0.5 mg via INTRAVENOUS

## 2014-05-03 MED ORDER — MIDAZOLAM HCL 2 MG/2ML IJ SOLN
INTRAMUSCULAR | Status: AC
  Filled 2014-05-03: qty 2

## 2014-05-03 MED ORDER — MIDAZOLAM HCL 5 MG/5ML IJ SOLN
INTRAMUSCULAR | Status: DC | PRN
  Administered 2014-05-03: 2 mg via INTRAVENOUS

## 2014-05-03 MED ORDER — LAMOTRIGINE 100 MG PO TABS
100.0000 mg | ORAL_TABLET | Freq: Two times a day (BID) | ORAL | Status: DC
  Administered 2014-05-03: 100 mg via ORAL

## 2014-05-03 MED ORDER — CEFAZOLIN SODIUM-DEXTROSE 2-3 GM-% IV SOLR
2.0000 g | INTRAVENOUS | Status: AC
  Administered 2014-05-03: 2 g via INTRAVENOUS

## 2014-05-03 MED ORDER — FENTANYL CITRATE 0.05 MG/ML IJ SOLN
INTRAMUSCULAR | Status: AC
  Filled 2014-05-03: qty 2

## 2014-05-03 MED ORDER — OXYCODONE HCL 5 MG PO TABS
5.0000 mg | ORAL_TABLET | ORAL | Status: DC | PRN
  Administered 2014-05-03 – 2014-05-04 (×4): 10 mg via ORAL
  Filled 2014-05-03 (×4): qty 2

## 2014-05-03 MED ORDER — ACETAMINOPHEN 325 MG PO TABS: 650.0000 mg | ORAL_TABLET | Freq: Four times a day (QID) | ORAL | Status: DC | PRN

## 2014-05-03 MED ORDER — OXYCODONE HCL 5 MG/5ML PO SOLN: 5.0000 mg | Freq: Once | ORAL | Status: AC | PRN

## 2014-05-03 MED ORDER — RISPERIDONE 0.25 MG PO TABS: 0.2500 mg | ORAL_TABLET | Freq: Two times a day (BID) | ORAL | Status: DC

## 2014-05-03 MED ORDER — OXYCODONE HCL 5 MG PO TABS
5.0000 mg | ORAL_TABLET | Freq: Once | ORAL | Status: AC | PRN
  Administered 2014-05-03: 5 mg via ORAL

## 2014-05-03 MED ORDER — MIDAZOLAM HCL 2 MG/2ML IJ SOLN
1.0000 mg | INTRAMUSCULAR | Status: DC | PRN
  Administered 2014-05-03 (×2): 1 mg via INTRAVENOUS

## 2014-05-03 MED ORDER — ACETAMINOPHEN 650 MG RE SUPP: 650.0000 mg | Freq: Four times a day (QID) | RECTAL | Status: DC | PRN

## 2014-05-03 MED ORDER — ZOLPIDEM TARTRATE 10 MG PO TABS: 10.0000 mg | ORAL_TABLET | Freq: Every evening | ORAL | Status: DC | PRN

## 2014-05-03 MED ORDER — LACTATED RINGERS IV SOLN
INTRAVENOUS | Status: DC
Start: 2014-05-03 — End: 2014-05-03
  Administered 2014-05-03 (×2): via INTRAVENOUS

## 2014-05-03 MED ORDER — QUETIAPINE FUMARATE 200 MG PO TABS: 200.0000 mg | ORAL_TABLET | Freq: Two times a day (BID) | ORAL | Status: DC

## 2014-05-03 MED ORDER — LIDOCAINE HCL (CARDIAC) 20 MG/ML IV SOLN
INTRAVENOUS | Status: DC | PRN
  Administered 2014-05-03: 30 mg via INTRAVENOUS

## 2014-05-03 SURGICAL SUPPLY — 65 items
ADH SKN CLS APL DERMABOND .7 (GAUZE/BANDAGES/DRESSINGS) ×4
APL SKNCLS STERI-STRIP NONHPOA (GAUZE/BANDAGES/DRESSINGS) ×4
APPLIER CLIP 9.375 MED OPEN (MISCELLANEOUS) ×3
APR CLP MED 9.3 20 MLT OPN (MISCELLANEOUS) ×2
BENZOIN TINCTURE PRP APPL 2/3 (GAUZE/BANDAGES/DRESSINGS) ×4 IMPLANT
BINDER BREAST LRG (GAUZE/BANDAGES/DRESSINGS) IMPLANT
BINDER BREAST MEDIUM (GAUZE/BANDAGES/DRESSINGS) IMPLANT
BINDER BREAST XLRG (GAUZE/BANDAGES/DRESSINGS) IMPLANT
BINDER BREAST XXLRG (GAUZE/BANDAGES/DRESSINGS) ×1 IMPLANT
BLADE SURG 15 STRL LF DISP TIS (BLADE) ×2 IMPLANT
BLADE SURG 15 STRL SS (BLADE) ×9
CANISTER SUC SOCK COL 7IN (MISCELLANEOUS) IMPLANT
CANISTER SUCT 1200ML W/VALVE (MISCELLANEOUS) ×2 IMPLANT
CHLORAPREP W/TINT 26ML (MISCELLANEOUS) ×4 IMPLANT
CLIP APPLIE 9.375 MED OPEN (MISCELLANEOUS) ×2 IMPLANT
COVER MAYO STAND STRL (DRAPES) ×4 IMPLANT
COVER PROBE W GEL 5X96 (DRAPES) ×4 IMPLANT
COVER TABLE BACK 60X90 (DRAPES) ×3 IMPLANT
DECANTER SPIKE VIAL GLASS SM (MISCELLANEOUS) IMPLANT
DERMABOND ADVANCED (GAUZE/BANDAGES/DRESSINGS) ×2
DERMABOND ADVANCED .7 DNX12 (GAUZE/BANDAGES/DRESSINGS) ×2 IMPLANT
DEVICE DUBIN W/COMP PLATE 8390 (MISCELLANEOUS) ×4 IMPLANT
DRAPE LAPAROSCOPIC ABDOMINAL (DRAPES) ×4 IMPLANT
DRAPE PED LAPAROTOMY (DRAPES) ×2 IMPLANT
DRSG TEGADERM 4X4.75 (GAUZE/BANDAGES/DRESSINGS) ×4 IMPLANT
ELECT COATED BLADE 2.86 ST (ELECTRODE) ×4 IMPLANT
ELECT REM PT RETURN 9FT ADLT (ELECTROSURGICAL) ×3
ELECTRODE REM PT RTRN 9FT ADLT (ELECTROSURGICAL) ×2 IMPLANT
GLOVE BIO SURGEON STRL SZ7 (GLOVE) ×10 IMPLANT
GLOVE BIOGEL M 7.0 STRL (GLOVE) ×1 IMPLANT
GLOVE BIOGEL PI IND STRL 7.5 (GLOVE) ×2 IMPLANT
GLOVE BIOGEL PI INDICATOR 7.5 (GLOVE) ×5
GOWN STRL REUS W/ TWL LRG LVL3 (GOWN DISPOSABLE) ×4 IMPLANT
GOWN STRL REUS W/TWL LRG LVL3 (GOWN DISPOSABLE) ×15
KIT MARKER MARGIN INK (KITS) ×4 IMPLANT
MARKER SKIN DUAL TIP RULER LAB (MISCELLANEOUS) ×1 IMPLANT
NDL HYPO 25X1 1.5 SAFETY (NEEDLE) ×2 IMPLANT
NEEDLE HYPO 25X1 1.5 SAFETY (NEEDLE) ×3 IMPLANT
NS IRRIG 1000ML POUR BTL (IV SOLUTION) ×1 IMPLANT
PACK BASIN DAY SURGERY FS (CUSTOM PROCEDURE TRAY) ×3 IMPLANT
PENCIL BUTTON HOLSTER BLD 10FT (ELECTRODE) ×4 IMPLANT
SHEET MEDIUM DRAPE 40X70 STRL (DRAPES) IMPLANT
SLEEVE SCD COMPRESS KNEE MED (MISCELLANEOUS) ×3 IMPLANT
SPONGE GAUZE 4X4 12PLY STER LF (GAUZE/BANDAGES/DRESSINGS) ×3 IMPLANT
SPONGE LAP 4X18 X RAY DECT (DISPOSABLE) ×5 IMPLANT
STAPLER VISISTAT 35W (STAPLE) ×4 IMPLANT
STOCKINETTE IMPERVIOUS LG (DRAPES) IMPLANT
STRIP CLOSURE SKIN 1/2X4 (GAUZE/BANDAGES/DRESSINGS) ×4 IMPLANT
SUT ETHILON 2 0 FS 18 (SUTURE) IMPLANT
SUT MNCRL AB 4-0 PS2 18 (SUTURE) ×4 IMPLANT
SUT MON AB 5-0 PS2 18 (SUTURE) ×1 IMPLANT
SUT SILK 2 0 SH (SUTURE) ×1 IMPLANT
SUT VIC AB 2-0 SH 27 (SUTURE) ×9
SUT VIC AB 2-0 SH 27XBRD (SUTURE) ×2 IMPLANT
SUT VIC AB 3-0 SH 27 (SUTURE) ×9
SUT VIC AB 3-0 SH 27X BRD (SUTURE) ×2 IMPLANT
SUT VIC AB 4-0 SH 27 (SUTURE) ×3
SUT VIC AB 4-0 SH 27XANBCTRL (SUTURE) IMPLANT
SUT VIC AB 5-0 PS2 18 (SUTURE) IMPLANT
SYRINGE CONTROL L 12CC (SYRINGE) ×3 IMPLANT
SYRINGE CONTROL LL 12CC (SYRINGE) ×2 IMPLANT
TOWEL OR 17X24 6PK STRL BLUE (TOWEL DISPOSABLE) ×3 IMPLANT
TOWEL OR NON WOVEN STRL DISP B (DISPOSABLE) ×5 IMPLANT
TUBE CONNECTING 20X1/4 (TUBING) ×4 IMPLANT
YANKAUER SUCT BULB TIP NO VENT (SUCTIONS) ×2 IMPLANT

## 2014-05-03 NOTE — Anesthesia Postprocedure Evaluation (Signed)
  Anesthesia Post-op Note  Patient: Kimberly [name]  Procedure(s) Performed: Procedure(s): LEFT BREAST RADIOACTIVE SEED GUIDED LUMPECTOMY  WITH LEFT  AXILLARY SENTINEL LYMPH NODE BIOPSY (Left) RIGHT BREASTRADIOACTIVE SEED GUIDED EXCISIONAL BREAST BIOPSY, RIGHT AXILLA ACESSORY BREAST TISSUE (Right)  Patient Location: PACU  Anesthesia Type: General, Regional   Level of Consciousness: awake, alert  and oriented  Airway and Oxygen Therapy: Patient Spontanous Breathing  Post-op Pain: moderate  Post-op Assessment: Post-op Vital signs reviewed  Post-op Vital Signs: Reviewed  Last Vitals:  Filed Vitals:   05/03/14 1345  BP: 138/83  Pulse: 83  Temp:   Resp: 20    Complications: No apparent anesthesia complications

## 2014-05-03 NOTE — Interval H&P Note (Signed)
History and Physical Interval Note:  05/03/2014 10:31 AM  Kimberly [name]  has presented today for surgery, with the diagnosis of lt breast cancer, accessory rt breast tissue, rt breast mass  The various methods of treatment have been discussed with the patient and family. After consideration of risks, benefits and other options for treatment, the patient has consented to  Procedure(s): LEFT BREAST RADIOACTIVE SEED GUIDED LUMPECTOMY  WITH LEFT  AXILLARY SENTINEL LYMPH NODE BIOPSY (Left) RIGHT BREASTRADIOACTIVE SEED GUIDED EXCISIONAL BREAST BIOPSY, RIGHT AXILLA ACESSORY BREAST TISSUE (Right) as a surgical intervention .  The patient's history has been reviewed, patient examined, no change in status, stable for surgery.  I have reviewed the patient's chart and labs.  Questions were answered to the patient's satisfaction.     [name]

## 2014-05-03 NOTE — Transfer of Care (Signed)
Immediate Anesthesia Transfer of Care Note  Patient: Kimberly [name]  Procedure(s) Performed: Procedure(s): LEFT BREAST RADIOACTIVE SEED GUIDED LUMPECTOMY  WITH LEFT  AXILLARY SENTINEL LYMPH NODE BIOPSY (Left) RIGHT BREASTRADIOACTIVE SEED GUIDED EXCISIONAL BREAST BIOPSY, RIGHT AXILLA ACESSORY BREAST TISSUE (Right)  Patient Location: PACU  Anesthesia Type:GA combined with regional for post-op pain  Level of Consciousness: awake and patient cooperative  Airway & Oxygen Therapy: Patient Spontanous Breathing and Patient connected to face mask oxygen  Post-op Assessment: Report given to PACU RN and Post -op Vital signs reviewed and stable  Post vital signs: Reviewed and stable  Complications: No apparent anesthesia complications

## 2014-05-03 NOTE — Discharge Instructions (Signed)
[facility] °Office Phone Number [phone] ° °BREAST BIOPSY/ PARTIAL MASTECTOMY: POST OP INSTRUCTIONS ° °Always review your discharge instruction sheet given to you by the facility where your surgery was performed. ° °IF YOU HAVE DISABILITY OR FAMILY LEAVE FORMS, YOU MUST BRING THEM TO THE OFFICE FOR PROCESSING.  DO NOT GIVE THEM TO YOUR DOCTOR. ° °1. A prescription for pain medication may be given to you upon discharge.  Take your pain medication as prescribed, if needed.  If narcotic pain medicine is not needed, then you may take acetaminophen (Tylenol), naprosyn (Alleve) or ibuprofen (Advil) as needed. °2. Take your usually prescribed medications unless otherwise directed °3. If you need a refill on your pain medication, please contact your pharmacy.  They will contact our office to request authorization.  Prescriptions will not be filled after 5pm or on week-ends. °4. You should eat very light the first 24 hours after surgery, such as soup, crackers, pudding, etc.  Resume your normal diet the day after surgery. °5. Most patients will experience some swelling and bruising in the breast.  Ice packs and a good support bra will help.  Wear the breast binder provided or a sports bra for 72 hours day and night.  After that wear a sports bra during the day until you return to the office. Swelling and bruising can take several days to resolve.  °6. It is common to experience some constipation if taking pain medication after surgery.  Increasing fluid intake and taking a stool softener will usually help or prevent this problem from occurring.  A mild laxative (Milk of Magnesia or Miralax) should be taken according to package directions if there are no bowel movements after 48 hours. °7. Unless discharge instructions indicate otherwise, you may remove your bandages 48 hours after surgery and you may shower at that time.  You may have steri-strips (small skin tapes) in place directly over the incision.   These strips should be left on the skin for 7-10 days and will come off on their own.  If your surgeon used skin glue on the incision, you may shower in 24 hours.  The glue will flake off over the next 2-3 weeks.  Any sutures or staples will be removed at the office during your follow-up visit. °8. ACTIVITIES:  You may resume regular daily activities (gradually increasing) beginning the next day.  Wearing a good support bra or sports bra minimizes pain and swelling.  You may have sexual intercourse when it is comfortable. °a. You may drive when you no longer are taking prescription pain medication, you can comfortably wear a seatbelt, and you can safely maneuver your car and apply brakes. °b. RETURN TO WORK:  ______________________________________________________________________________________ °9. You should see your doctor in the office for a follow-up appointment approximately two weeks after your surgery.  Your doctor’s nurse will typically make your follow-up appointment when she calls you with your pathology report.  Expect your pathology report 3-4 business days after your surgery.  You may call to check if you do not hear from us after three days. °10. OTHER INSTRUCTIONS: _______________________________________________________________________________________________ _____________________________________________________________________________________________________________________________________ °_____________________________________________________________________________________________________________________________________ °_____________________________________________________________________________________________________________________________________ ° °WHEN TO CALL DR Vickie Ponds: °1. Fever over 101.0 °2. Nausea and/or vomiting. °3. Extreme swelling or bruising. °4. Continued bleeding from incision. °5. Increased pain, redness, or drainage from the incision. ° °The clinic staff is available to  answer your questions during regular business hours.  Please don’t hesitate to call and ask to speak to one of the nurses for   clinical concerns.  If you have a medical emergency, go to the nearest emergency room or call 911.  A surgeon from [facility] is always on call at the hospital. ° °For further questions, please visit [email or web address] ° °

## 2014-05-03 NOTE — H&P (View-Only) (Signed)
Subjective:     Patient ID: Kimberly [name], female   DOB: 02/22/1971, 43 y.o.   MRN: [id number]  HPI 43 yof who I saw on 6/16 for new diagnosis of a left breast cancer.  Her prognostic panel is 83% er positive, 66% pr positive, Ki is 14%, her2 not amplified. She also had abnormality on mr on right breast with us correlate that has undergone biopsy and is ectatic ducts.  This is concordant with us findings but discordant with mr findings and radiology is recommending excisional biopsy of this area.  She also has nodule and pain in right axilla that has been present for some time.  She had genetics appt on 24th and has testing pending right now. There have been no other changes since our last visit.  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  I also discussed a seed guided right breast excisional biopsy for discordance on path and mri.  We discussed this could be cancer and need more therapy.  I also discussed pathology and prognostic panel which was not back last time I saw her.  We discussed again all of the different options for treatment for breast cancer including surgery, chemotherapy, radiation therapy, Herceptin, and antiestrogen therapy.   We discussed a sentinel lymph node biopsy as she does not appear to having lymph node involvement right now. We discussed the performance of that with injection of radioactive tracer.  We discussed that she would have an incision underneath her axillary hairline. We discussed that there is a chance of having a positive node with a sentinel lymph node biopsy and we will await the permanent pathology to make any other first further decisions in terms of her treatment. One of these options might be to return to the operating room to perform an axillary lymph node dissection. We discussed up to a 5% risk lifetime of chronic shoulder pain as well as lymphedema associated with a sentinel lymph node biopsy.  We discussed the options for treatment of the breast cancer again. which included lumpectomy versus a mastectomy. I think she is a candidate for lumpectomy after all data is back now. We discussed the performance of the lumpectomy with seed placement. We discussed a 5-10% chance of a positive margin requiring reexcision in the operating room. We also discussed that she will need radiation therapy if she undergoes lumpectomy. We discussed the mastectomy and the postoperative care for that as well. We discussed that there is no difference in her survival whether she undergoes lumpectomy with radiation therapy or antiestrogen therapy versus a mastectomy.  We discussed the risks of operation including bleeding, infection, possible reoperation. She understands her further therapy will be based on what her stages at the time of her operation. We will plan on sending oncotype to determine need for chemotherapy and I discussed this also.         

## 2014-05-03 NOTE — Anesthesia Preprocedure Evaluation (Addendum)
Anesthesia Evaluation  Patient identified by MRN, date of birth, ID band Patient awake    Reviewed: Allergy & Precautions, H&P , NPO status , Patient's Chart, lab work & pertinent test results  Airway Mallampati: II TM Distance: >3 FB Neck ROM: Full    Dental no notable dental hx. (+) Partial Upper, Partial Lower, Dental Advisory Given   Pulmonary Current Smoker,  breath sounds clear to auscultation  Pulmonary exam normal       Cardiovascular negative cardio ROS  Rhythm:Regular Rate:Normal     Neuro/Psych PSYCHIATRIC DISORDERS Bipolar Disorder Schizophrenia negative neurological ROS     GI/Hepatic negative GI ROS, Neg liver ROS,   Endo/Other  diabetesMorbid obesity  Renal/GU negative Renal ROS  negative genitourinary   Musculoskeletal   Abdominal   Peds  Hematology negative hematology ROS (+) anemia ,   Anesthesia Other Findings   Reproductive/Obstetrics negative OB ROS                          Anesthesia Physical Anesthesia Plan  ASA: III  Anesthesia Plan: General and Regional   Post-op Pain Management:    Induction: Intravenous  Airway Management Planned: LMA  Additional Equipment:   Intra-op Plan:   Post-operative Plan: Extubation in OR  Informed Consent: I have reviewed the patients History and Physical, chart, labs and discussed the procedure including the risks, benefits and alternatives for the proposed anesthesia with the patient or authorized representative who has indicated his/her understanding and acceptance.   Dental advisory given  Plan Discussed with: CRNA  Anesthesia Plan Comments:         Anesthesia Quick Evaluation

## 2014-05-03 NOTE — Op Note (Signed)
Preoperative diagnoses: #1 clinical stage I left breast cancer #2 right breast MRI abnormality #3 right axillary accessory breast tissue Postoperative diagnosis: Same as above Procedure: #1 right breast radioactive seed guided excisional biopsy #2 right axillary accessory breast tissue excision #3 left breast radioactive seed guided lumpectomy #4 left axillary sentinel lymph node biopsy Surgeon: Dr. Serita Grammes Estimated blood loss: 50 cc Complications: None Drains: None Specimens: #1 right breast tissue with radioactive seed marked with paint #2 right axillary tissue marked short stitch superior, long stitch lateral, double stitch deep #3 left breast lumpectomy marked with paint #4 left axillary sentinel lymph node x2 with counts of 2254 and 109 Sponge and needle count was correct at completion Disposition to recovery stable  Indications: This is a 43 year old female with a newly diagnosed left breast cancer. She also has an MRI abnormality that is  Not concordant to a core biopsy. She also has accessory breast tissue to right axilla and is bothersome. We discussed left breast lumpectomy left axillary sentinel node biopsy, right breast excisional biopsy, and excision of this accessory tissue.  Surgical timeout was performed.  Procedure: She had a seed placed in both lesions prior to beginning. After informed consent was obtained she was then brought to day surgery. I had these mammograms in the operating room. She was administered technetium in the standard periareolarfashion. She then underwent a pectoral block. She was given 2 g of cefazolin. She had sequential compression devices on her legs. She was in placed under general anesthesia without complication. Surgical time out was performed.  I did the right side first. I identified the location of the seed which was immediately behind the nipple. I made a periareolar incision. I used a neoprobe to guide excision of the seed and some of  the surrounding tissue. This was immediately beneath the nipple areolar complex. I removed the radioactive seed with the guidance of the neoprobe. This was then marked with paint. I then did a Faxitron mammogram confirming removal of the clip, radioactive seed, and lesion. This was confirmed by radiology. This was sent to pathology who confirmed receipt of the seed. Hemostasis was obtained. I then closed with 2-0 Vicryl, 3-0 Vicryl, and 5-0 Monocryl. Dermabond and Steri-Strips were placed.  I then identified the accessory breast tissue which was a large nodule in the right axilla. I then made an elliptical incision overlying this. I then excised this in total. There was no other abnormality noted in her axilla. This was marked with a stitch. I then closed the dead space with a 2-0 Vicryl. The dermis was closed with 3-0 Vicryl, 4-0 Monocryl, benzoin and Steri-Strips. A sterile dressing was placed. We then broke down the entire set and reprepped and draped. Another surgical time out was then performed.  I identified the location of the seed in the upper outer quadrant of the left breast. I then made a curvilinear incision overlying this. I then used the neoprobe to guide the excision with an attempt to get clear margins. This was not down to the pectoralis muscle. I then marked this with paint. Mammogram was taken confirming removal of the clip as well as radioactive seed. The other clip was actually in the specimen as well from a benign biopsy. Pathology confirmed that they thought grossly my margins were all clear. I then placed clips in each position around this cavity. I then closed with 2-0 Vicryl, 3-0 Vicryl, 4-0 Monocryl, and Dermabond and Steri-Strips.  Identified the sentinel node. I made an  incision below the axillary hairline. This was carried through the axillary fascia. I removed the first sentinel node. The blood loss was accounted for by a small blood vessel and this also accounted for the clips  that are in the axilla. I then identified a smaller necrotic appearing node as well. These both were removed. There is no background radioactivity. I did obtain hemostasis. I then irrigated. I closed with 2-0 Vicryl, 3-0 Vicryl, 4-0 Monocryl. A sterile dressing was placed. She tolerated this well was extubated and transferred to recovery stable.

## 2014-05-03 NOTE — Progress Notes (Signed)
Assisted Dr. Ola Spurr with left, ultrasound guided, pectoralis block. Side rails up, monitors on throughout procedure. See vital signs in flow sheet. Tolerated Procedure well.

## 2014-05-03 NOTE — Progress Notes (Signed)
Nuc med inj completed by radiology staff. Pt tol well, VSS (see doc flowsheets), fentanyl and versed given for sedation.

## 2014-05-03 NOTE — Anesthesia Procedure Notes (Addendum)
Anesthesia Regional Block:  Pectoralis block  Pre-Anesthetic Checklist: ,, timeout performed, Correct Patient, Correct Site, Correct Laterality, Correct Procedure, Correct Position, site marked, Risks and benefits discussed, pre-op evaluation, post-op pain management  Laterality: Left  Prep: Maximum Sterile Barrier Precautions used and chloraprep       Needles:  Injection technique: Single-shot  Needle Type: Echogenic Stimulator Needle     Needle Length: 9cm 9 cm Needle Gauge: 21 and 21 G    Additional Needles:  Procedures: ultrasound guided (picture in chart) Pectoralis block Narrative:  Start time: 05/03/2014 10:00 AM End time: 05/03/2014 10:10 AM Injection made incrementally with aspirations every 5 mL. Anesthesiologist: Fitzgerald,MD  Additional Notes: 2% Lidocaine skin wheel.   Procedure Name: LMA Insertion Date/Time: 05/03/2014 10:37 AM Performed by: Chlora Mcbain Pre-anesthesia Checklist: Patient identified, Emergency Drugs available, Suction available and Patient being monitored Patient Re-evaluated:Patient Re-evaluated prior to inductionOxygen Delivery Method: Circle System Utilized Preoxygenation: Pre-oxygenation with 100% oxygen Intubation Type: IV induction Ventilation: Mask ventilation without difficulty LMA: LMA inserted LMA Size: 4.0 Number of attempts: 1 Airway Equipment and Method: bite block Placement Confirmation: positive ETCO2 Tube secured with: Tape Dental Injury: Teeth and Oropharynx as per pre-operative assessment

## 2014-05-04 ENCOUNTER — Telehealth (INDEPENDENT_AMBULATORY_CARE_PROVIDER_SITE_OTHER): Payer: Self-pay | Admitting: *Deleted

## 2014-05-04 DIAGNOSIS — C50919 Malignant neoplasm of unspecified site of unspecified female breast: Secondary | ICD-10-CM | POA: Diagnosis not present

## 2014-05-04 NOTE — Telephone Encounter (Signed)
Called to see how pt was doing since sx 05-03-44, pt's husband reports "so far so good".  He said she was sleeping right now, but everything was going just fine.  He was very appreciative of the phone call.  Anderson Malta

## 2014-05-05 ENCOUNTER — Telehealth (INDEPENDENT_AMBULATORY_CARE_PROVIDER_SITE_OTHER): Payer: Self-pay

## 2014-05-05 ENCOUNTER — Encounter: Payer: Self-pay | Admitting: *Deleted

## 2014-05-05 DIAGNOSIS — C50912 Malignant neoplasm of unspecified site of left female breast: Secondary | ICD-10-CM

## 2014-05-05 NOTE — Telephone Encounter (Signed)
Placed orders in epic for medical and radiation oncology referrals.

## 2014-05-05 NOTE — Progress Notes (Signed)
Received order for oncotype testing. Requisition sent to pathology 

## 2014-05-07 ENCOUNTER — Telehealth: Payer: Self-pay | Admitting: *Deleted

## 2014-05-07 ENCOUNTER — Telehealth (INDEPENDENT_AMBULATORY_CARE_PROVIDER_SITE_OTHER): Payer: Self-pay

## 2014-05-07 DIAGNOSIS — C50412 Malignant neoplasm of upper-outer quadrant of left female breast: Secondary | ICD-10-CM

## 2014-05-07 DIAGNOSIS — Z170 Estrogen receptor positive status [ER+]: Secondary | ICD-10-CM | POA: Insufficient documentation

## 2014-05-07 NOTE — Telephone Encounter (Signed)
Spoke with patient and confirmed new patient appointment for 05/28/14 at 2pm for FIN, lab, and Dr. Jana Hakim.  Instructions and contact information given.  Mailed patient letter and packet.  Took paperwork to HIM for chart.

## 2014-05-07 NOTE — Telephone Encounter (Signed)
Tried calling pt but n/a can't leave message v.m. Full. I want to give her the f/u appt with Dr Donne Hazel for 05/28/14 arrive 11:15/11:30.

## 2014-05-10 ENCOUNTER — Telehealth (INDEPENDENT_AMBULATORY_CARE_PROVIDER_SITE_OTHER): Payer: Self-pay

## 2014-05-10 DIAGNOSIS — G8918 Other acute postprocedural pain: Secondary | ICD-10-CM

## 2014-05-10 NOTE — Telephone Encounter (Signed)
Pt s/p left breast lumopctomy on 05/02/14. Pt calling today to get a refill on her Percocet 10/325. Pts last refill on 05/02/14. Pt rates her pain a 8 at this time. Advised pt that she can also take Ibuprofen 800mg  every 8 hours as needed for pain. Advised pt that Dr Donne Hazel is unavailable, however I will give this to one of his partners. We will contact her as soon as we receive a response. Informed pt that if refill is given they will need to come by the office to pick Rx up. Pt verbalized understanding and agrees with POC.

## 2014-05-11 ENCOUNTER — Encounter: Payer: Self-pay | Admitting: *Deleted

## 2014-05-11 MED ORDER — OXYCODONE-ACETAMINOPHEN 10-325 MG PO TABS: 1.0000 | ORAL_TABLET | ORAL | Status: DC | PRN

## 2014-05-11 NOTE — Progress Notes (Signed)
Completed chart, labs entered, added to spreadsheet and placed in Dr. Magrinat's box.  

## 2014-05-11 NOTE — Addendum Note (Signed)
Addended by: Ivor Costa on: 05/11/2014 08:21 AM   Modules accepted: Orders

## 2014-05-11 NOTE — Telephone Encounter (Addendum)
Called patient to make aware RX for Percocet will be at the front desk for pick up. Documented in meds & orders

## 2014-05-12 NOTE — Progress Notes (Addendum)
Location of Breast Cancer: Left Breast- Upper Outer  Histology per Pathology Report:   05/03/14 Diagnosis 1. Breast, lumpectomy, right - FIBROCYSTIC CHANGES WITH CALCIFICATIONS. - INTRADUCTAL PAPILLOMA. - BIOPSY SITE REACTION. - NO MALIGNANCY IDENTIFIED. 2. 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 - MARGINS NOT INVOLVED. 3. Breast, accessory tissue, Right - BENIGN BREAST TISSUE. - NO MALIGNANCY IDENTIFIED   04/19/14 Diagnosis Breast, right, needle core biopsy, 6 o'clock subareolar - ECTATIC DUCTS.  04/01/14 diagnosis 1. Breast, left, needle core biopsy, 1 o'clock, 8 cm from nipple - INVASIVE DUCTAL CARCINOMA, SEE COMMENT. - DUCTAL CARCINOMA IN SITU. 2. Breast, left, needle core biopsy, 1 o'clock, 7 cm from nipple - BENIGN BREAST TISSUE, SEE COMMENT. - NEGATIVE FOR ATYPIA OR MALIGNANCY   Receptor Status: ER(83%), PR (66%), [id number] (No Amplication), UY(37%)  Kimberly [name] stated she had her first mammogram as "god told her to". "She had no complaints referable to either breast and no past history. There is no family history present. Her breast density is c. Mm of the left breast shows an irregular mass located in uoq with a possible satellite near this. US shows a 1.4x1x.6 cm mass and a subtle small 4 mm mass located 4m anterior and inferior."   Past/Anticipated interventions by surgeon, if any: Dr. MRolm BookbinderLumpectomy Right Breast  Past/Anticipated interventions by medical oncology, if any: Chemotherapy Appointment with Dr. GLurline Delon 05/28/14  Lymphedema issues, if any:   Underneath her right arm.  Pain issues, if any: reports pain under both arms and both breasts at a 5./10.  She is currently taking 2 tablets of percocet 10/325 mg q 6 hours.  She reports the make her sleepy but are not helping with the pain.  OB GYN history:  15 years at first period, 89years at birth of first  child, she has 5 children, has not used bcp, reports she skipped her period this month.  She took a pregnancy test and it was negative.    SAFETY ISSUES:  Prior radiation? NO  Pacemaker/ICD? no  Possible current pregnancy?NO - patient skipped her period in July, took a pregnancy test which was negative  Is the patient on methotrexate?NO  Current Complaints / other details:  Patient is here with her husband.  She reports insomnia and is out of Ambien.  She reports that she fell down the stairs before her last surgery.  She reports feeling dizzy and lightheaded occasionally.  She reports having occasional headaches.

## 2014-05-12 NOTE — Progress Notes (Signed)
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 UOQ   HISTORY OF PRESENT ILLNESS::Kimberly [name] is a 43 y.o. female who did not feel anything in her breast, but was "told by God to get a mammogram." On her first mammogram dated 03/19/14 was found to have a possible left breast mass. Diagnostic spot compression views on 04-01-14 showed  an irregular mass located within the upper outer quadrant left breast 1 o'clock position. There may be a subtle small satellite nodule located approximately 1 cm anterior to this mass. Ultrasound revealed an irregular hypoechoic mass with shadowing located within the left breast at 1 o'clock position 8 cm from the nipple corresponding to the palpable finding. This measures 1.4 x 1.0 x 0.6 cm in size and is worrisome for invasive mammary carcinoma. There was a subtle small (4 mm) nodule located 9 mm anterior and inferior to this larger mass. Ultrasound of the left axilla demonstrated normal axillary contents and no evidence for adenopathy.  04-08-14 MRI showed biopsy proven malignancy in the superior left breast measures up to 1.3 cm. No findings to suggest multifocal or multicentric disease.  Indeterminate 9 mm oval enhancing mass in the subareolar right breast was also appreciated.   04-19-14 US showed in R breast: 8 mm intraductal soft tissue mass located within the inferior subareolar portion of the right breast at 6 o'clock position.  PATHOLOGY: Biopsy on 04-01-14 of left breast (ER/PR +; HER2 neg ) Grade I-II 1. Breast, left, needle core biopsy, 1 o'clock, 8 cm from nipple - INVASIVE DUCTAL CARCINOMA, SEE COMMENT. - DUCTAL  CARCINOMA IN SITU. 2. Breast, left, needle core biopsy, 1 o'clock, 7 cm from nipple - BENIGN BREAST TISSUE, SEE COMMENT. - NEGATIVE FOR ATYPIA OR MALIGNANCY.  6/29 Right breast biopsy:  Breast, right, needle core biopsy, 6 o'clock subareolar - ECTATIC DUCTS.  05-03-14 surgery: Invasive disease in left breast is grade II, margins negative by 0.5cm. 1. Breast, lumpectomy, right - FIBROCYSTIC CHANGES WITH CALCIFICATIONS. - INTRADUCTAL PAPILLOMA. - BIOPSY SITE REACTION. - NO MALIGNANCY IDENTIFIED. 2. 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 - MARGINS NOT INVOLVED. 3. Breast, accessory tissue, Right - BENIGN BREAST TISSUE. - NO MALIGNANCY IDENTIFIED. 4. Lymph node, sentinel, biopsy, Left axillary #1 - ONE BENIGN LYMPH NODE (0/1). 5. Lymph node, sentinel, biopsy, Left axillary #2 - METASTATIC CARCINOMA IN ONE LYMPH NODE (1/1).  Oncotype testing - intermediate score of 22. Med/Onc appt (consult) pending for 05-28-14.  PREVIOUS RADIATION THERAPY: No  PAST MEDICAL HISTORY:  has a past medical history of Anemia; Arthritis; Schizophrenia; Bipolar 1 disorder; Blood transfusion without reported diagnosis (2007); Cancer; Heart murmur; and Diabetes mellitus without complication.    PAST SURGICAL HISTORY: Past Surgical History  Procedure Laterality Date  . Tonsillectomy and adenoidectomy    . Dilation and curettage of uterus      after childbirth  . Multiple tooth extractions      only 5 left  . Breast biopsy Left 04/01/14  . Breast biopsy Right 04/19/14  . Breast lumpectomy with axillary lymph node dissection Bilateral 05/03/14    FAMILY HISTORY: family history is not on file.  SOCIAL HISTORY:  reports that she has been smoking Cigarettes.  She has a 32 pack-year smoking history. She has never used smokeless tobacco. She reports that she does not drink alcohol or use illicit drugs.  ALLERGIES: Review of patient's  allergies indicates no known allergies.  MEDICATIONS:  Current Outpatient Prescriptions  Medication Sig Dispense Refill  . citalopram (CELEXA) 40 MG tablet Take 0.5-1 tablets (20-40 mg total) by mouth 2 (two) times daily. Takes 1 tab qam and 1/2 tab qhs- Weaning off, per mental health  45 tablet  1  . clonazePAM (KLONOPIN) 1 MG tablet daily.      . ferrous sulfate 325 (65 FE) MG tablet Take 1 tablet (325 mg total) by mouth daily with breakfast.  90 tablet  3  . lamoTRIgine (LAMICTAL) 200 MG tablet TAKE 1 TABLET BY MOUTH TWICE DAILY  60 tablet  2  . oxyCODONE-acetaminophen (PERCOCET) 10-325 MG per tablet Take 1-2 tablets by mouth every 6 (six) hours as needed for pain.  30 tablet  0  . QUEtiapine (SEROQUEL) 200 MG tablet Take 1 tablet (200 mg total) by mouth 2 (two) times daily.  60 tablet  5  . risperiDONE (RISPERDAL) 0.25 MG tablet Take by mouth 2 (two) times daily. Unsure of dose. Given by mental health.      . traMADol (ULTRAM) 50 MG tablet TAKE 1 TABLET BY MOUTH EVERY 8 HOURS AS NEEDED  30 tablet  2  . acetaminophen (TYLENOL) 500 MG tablet Take 1,000 mg by mouth every 6 (six) hours as needed. Pain      . oxyCODONE-acetaminophen (PERCOCET) 10-325 MG per tablet Take 1 tablet by mouth every 4 (four) hours as needed for pain.  20 tablet  0  . zolpidem (AMBIEN) 10 MG tablet daily.       No current facility-administered medications for this encounter.    REVIEW OF SYSTEMS:  Notable for that above.   PHYSICAL EXAM:  height is $RemoveB'5\' 8"'CupIrIjO$  (1.727 m) and weight is 260 lb 12.8 oz (118.298 kg). Her oral temperature is 98 F (36.7 C). Her blood pressure is 120/68 and her pulse is 99.   General: Alert and oriented, in no acute distress HEENT: Head is normocephalic. Pupils are equally round and reactive to light. Extraocular movements are intact. Oropharynx is clear. Poor dentition. Neck: Neck is supple, no palpable cervical or supraclavicular lymphadenopathy. Heart: Regular in rate and rhythm with no  murmurs, rubs, or gallops. Chest: Clear to auscultation bilaterally, with no rhonchi, wheezes, or rales. Abdomen: Soft, nontender, nondistended, with no rigidity or guarding. Extremities: No cyanosis; she has tender swelling in right ankle from prior trauma. Lymphatics: No concerning lymphadenopathy. Skin: No concerning lesions. Musculoskeletal: symmetric strength and muscle tone throughout. Neurologic: Cranial nerves II through XII are grossly intact. No obvious focalities. Speech is fluent. Coordination is intact. Psychiatric: Judgment and insight are intact. Affect is appropriate. Breasts: healing from bilateral surgery.   ECOG = 1  0 - Asymptomatic (Fully active, able to carry on all predisease activities without restriction)  1 - Symptomatic but completely ambulatory (Restricted in physically strenuous activity but ambulatory and able to carry out work of a light or sedentary nature. For example, light housework, office work)  2 - Symptomatic, <50% in bed during the day (Ambulatory and capable of all self care but unable to carry out any work activities. Up and about more than 50% of waking hours)  3 - Symptomatic, >50% in bed, but not bedbound (Capable of only  limited self-care, confined to bed or chair 50% or more of waking hours)  4 - Bedbound (Completely disabled. Cannot carry on any self-care. Totally confined to bed or chair)  5 - Death   Eustace Pen MM, Creech RH, Tormey DC, et al. [phone]). "Toxicity and response criteria of the [facility] Group". [place] Oncol. 5 (6): 649-55   LABORATORY DATA:  Lab Results  Component Value Date   WBC 7.0 04/30/2014   HGB 13.7 04/30/2014   HCT 41.7 04/30/2014   MCV 77.2* 04/30/2014   PLT 231 04/30/2014   CMP     Component Value Date/Time   NA 140 04/30/2014 0915   K 4.4 04/30/2014 0915   CL 101 04/30/2014 0915   CO2 25 04/30/2014 0915   GLUCOSE 79 04/30/2014 0915   BUN 14 04/30/2014 0915   CREATININE 1.02 04/30/2014 0915     CREATININE 0.94 02/25/2014 0926   CALCIUM 9.3 04/30/2014 0915   PROT 7.3 04/30/2014 0915   ALBUMIN 3.8 04/30/2014 0915   AST 15 04/30/2014 0915   ALT 10 04/30/2014 0915   ALKPHOS 77 04/30/2014 0915   BILITOT 0.2* 04/30/2014 0915   GFRNONAA 67* 04/30/2014 0915   GFRAA 77* 04/30/2014 0915         RADIOGRAPHY: Nm Sentinel Node Inj-no Rpt (breast)  05/03/2014   CLINICAL DATA: left axillary sentinel node biopsy   Sulfur colloid was injected intradermally by the nuclear medicine  technologist for breast cancer sentinel node localization.    Mm Breast Surgical Specimen  05/03/2014   CLINICAL DATA:  Recently diagnosed left breast ductal carcinoma in situ. The patient had an additional left breast biopsy with benign results.  EXAM: SPECIMEN RADIOGRAPH OF THE LEFT BREAST  COMPARISON:  Previous exam(s)  FINDINGS: Status post excision of the left breast. The radioactive seed, coil shaped biopsy marker clip and irregular mass are present and are marked for pathology. There is also a ribbon shaped biopsy marker clip in the periphery of the specimen, marking the previous biopsy with benign results. This was also marked for pathology.  IMPRESSION: Specimen radiograph of the left breast.   Electronically Signed   By: Enrique Sack M.D.   On: 05/03/2014 12:28   Mm Breast Surgical Specimen  05/03/2014   CLINICAL DATA:  8 mm mass in the 6 o'clock retroareolar region of the right breast on a recent breast MRI. The patient had a subsequent ultrasound-guided core needle biopsy with discordant results. Surgical excision was recommended.  EXAM: SPECIMEN RADIOGRAPH OF THE RIGHT BREAST  COMPARISON:  Previous exam(s)  FINDINGS: Status post excision of the right breast. The radioactive seed and biopsy marker clip are present and are marked for pathology.  IMPRESSION: Specimen radiograph of the right breast.   Electronically Signed   By: Enrique Sack M.D.   On: 05/03/2014 12:26   Mm Digital Diagnostic Unilat R  04/20/2014   CLINICAL  DATA:  Status post right breast ultrasound-guided core needle biopsy yesterday. The patient did not stay for post clip placement mammogram images.  EXAM: DIAGNOSTIC RIGHT MAMMOGRAM POST ULTRASOUND BIOPSY  COMPARISON:  Previous exams  FINDINGS: Mammographic images were obtained following ultrasound guided biopsy of an 8 mm intraductal mass in the 6 o'clock retroareolar region of the right breast. These demonstrate a ribbon shaped biopsy marker clip at the expected location of the biopsied mass in the inferior and slightly medial retroareolar region of the right breast. This is at the location of the 9 mm  oval enhancing mass seen on the recent staging MRI of the breasts.  IMPRESSION: Appropriate clip deployment following right breast ultrasound-guided core needle biopsy.  Final Assessment: Post Procedure Mammograms for Marker Placement   Electronically Signed   By: Enrique Sack M.D.   On: 04/20/2014 15:53   US Breast Ltd Uni Right Inc Axilla  04/19/2014   CLINICAL DATA:  Known left breast invasive mammary carcinoma. Breast MRI demonstrates an area of enhancement within the subareolar portion of the right breast measuring 9 mm in size. Second look ultrasound requested.  EXAM: ULTRASOUND OF THE right BREAST  COMPARISON:  Breast MRI dated 04/08/2014.  FINDINGS: On physical exam, there is no discrete palpable abnormality within the subareolar portion of the right breast.  Ultrasound is performed, showing an oval, circumscribed intraductal soft tissue mass located within the inferior subareolar portion of the right breast at the 6 o'clock position. This measures 8 x 5 x 3 mm in size. This is in the region of the enhancement noted on the recent breast MRI. Tissue sampling is recommended. I have discussed ultrasound-guided core biopsy of this area with the patient. This will be performed and reported separately.  IMPRESSION: 8 mm intraductal soft tissue mass located within the inferior subareolar portion of the right  breast at 6 o'clock position. Tissue sampling is recommended. Ultrasound-guided core biopsy will be performed and reported separately.  RECOMMENDATION: Right breast ultrasound-guided core biopsy.  I have discussed the findings and recommendations with the patient. Results were also provided in writing at the conclusion of the visit. If applicable, a reminder letter will be sent to the patient regarding the next appointment.  BI-RADS CATEGORY  4: Suspicious.   Electronically Signed   By: Luberta Robertson M.D.   On: 04/19/2014 16:06   Mm Lt Plc Breast Loc Dev   1st Lesion  Inc Mammo Guide  04/29/2014   CLINICAL DATA:  Patient with recent diagnosis left breast carcinoma. For preoperative localization prior to left breast lumpectomy.  EXAM: MAMMOGRAPHIC GUIDED RADIOACTIVE SEED LOCALIZATION OF THE LEFT BREAST  COMPARISON:  Previous exam(s)  FINDINGS: Patient presents for radioactive seed localization prior to left breast lumpectomy. I met with the patient and we discussed the procedure of seed localization including benefits and alternatives. We discussed the high likelihood of a successful procedure. We discussed the risks of the procedure including infection, bleeding, tissue injury and further surgery. We discussed the low dose of radioactivity involved in the procedure. Informed, written consent was given.  The usual time-out protocol was performed immediately prior to the procedure.  Using mammographic guidance, sterile technique, 2% lidocaine and an I-125 radioactive seed, coil shaped biopsy marking clip and spiculated mass within the posterior left breast laterally were localized using a cranial approach. The follow-up mammogram images confirm the seed in the expected location and are marked for Dr. Donne Hazel.  Follow-up survey of the patient confirms presents of radioactive seed adjacent to the biopsy marking clip.  Order number of I-125 seed:  [id number].  Dose of I-125 seed:  0.252.  The patient tolerated the  procedure well and was released from the Breast Center. She was given instructions regarding seed removal.  IMPRESSION: Radioactive seed localization spiculated mass within the left breast. No apparent complications.   Electronically Signed   By: Lovey Newcomer M.D.   On: 04/29/2014 09:20   [name] Rt Breast Bx W Loc Dev 1st Lesion Img Bx Spec US Guide  04/20/2014   ADDENDUM REPORT: 04/20/2014 15:57  ADDENDUM: The final pathological diagnosis is ectatic ducts. This is concordant with the ultrasound findings but discordant with the MRI findings. Therefore, surgical excision is recommended. The ribbon shaped biopsy marker clip is at the location of the mass seen on the MRI, and can be used for needle localization guidance.  The patient returned on 04/20/2014 for post procedure clip images. She reported marked pain at the biopsy site. On physical examination, the patient did not appear tender to palpation at that location and there was no bruising or palpable hematoma. The final pathological diagnosis and recommendation were discussed with the patient. She is scheduled to see Dr. Donne Hazel later today.   Electronically Signed   By: Enrique Sack M.D.   On: 04/20/2014 15:57   04/20/2014   CLINICAL DATA:  Known left breast invasive mammary carcinoma. Recent breast MRI demonstrated a small area of enhancement within the inferior subareolar portion of the right breast. Right breast second-look ultrasound demonstrated a small (8 mm) intraductal mass within the right breast at 6 o'clock subareolar location.  EXAM: ULTRASOUND GUIDED right BREAST CORE NEEDLE BIOPSY  COMPARISON:  Previous exams.  FINDINGS: I met with the patient and we discussed the procedure of ultrasound-guided biopsy, including benefits and alternatives. We discussed the high likelihood of a successful procedure. We discussed the risks of the procedure, including infection, bleeding, tissue injury, clip migration, and inadequate sampling. Informed written consent  was given. The usual time-out protocol was performed immediately prior to the procedure.  Using sterile technique and 2% Lidocaine as local anesthetic, under direct ultrasound visualization, a 14 gauge spring-loaded device was used to perform biopsy of the 8 mm mass located within the inferior subareolar portion of the right breast (6 o'clock position) using a medial approach. At the conclusion of the procedure a ribbon shaped tissue marker clip was deployed into the biopsy cavity. Follow up 2 view mammogram was performed and dictated separately.  IMPRESSION: Ultrasound guided biopsy of the small subareolar right breast mass as discussed above. No apparent complications.  The patient left prior to obtaining clip views following the right breast ultrasound-guided core biopsy. The patient will be contacted and will later return for these views.  Electronically Signed: By: Luberta Robertson M.D. On: 04/19/2014 16:10   Mm Rt Plc Breast Loc Dev   1st Lesion  Inc Mammo Guide  04/29/2014   CLINICAL DATA:  Patient with enhancing mass on preoperative MRI within the subareolar right breast. She underwent benign ultrasound-guided core needle biopsy. Surgical excision of this location was recommended.  EXAM: MAMMOGRAPHIC GUIDED RADIOACTIVE SEED LOCALIZATION OF THE RIGHT BREAST  COMPARISON:  Previous exam(s)  FINDINGS: Patient presents for radioactive seed localization prior to right breast surgical excision. I met with the patient and we discussed the procedure of seed localization including benefits and alternatives. We discussed the high likelihood of a successful procedure. We discussed the risks of the procedure including infection, bleeding, tissue injury and further surgery. We discussed the low dose of radioactivity involved in the procedure. Informed, written consent was given.  The usual time-out protocol was performed immediately prior to the procedure.  Using mammographic guidance, sterile technique, 2% lidocaine and  an I-125 radioactive seed, biopsy marking clip within the subareolar right breast was localized using a cranial approach. The follow-up mammogram images confirm the seed in the expected location and are marked for Dr. Donne Hazel.  Follow-up survey of the patient confirms presence of radioactive seed adjacent to the biopsy marking clip.  Order number  of I-125 seed:  [id number].  Dose of I-125 seed:  0.252 mCi.  The patient tolerated the procedure well and was released from the Breast Center. She was given instructions regarding seed removal.  IMPRESSION: Radioactive seed localization right breast. No apparent complications.   Electronically Signed   By: Lovey Newcomer M.D.   On: 04/29/2014 09:22      IMPRESSION/PLAN: It was a pleasure meeting the patient today. She is a good candidate for breast conservation and has already opted for lumpectomy. I talked to her about the option of a mastectomy and informed her that her expected overall survival would be equivalent between mastectomy and breast conservation, based upon randomized controlled data. She is enthusiastic about breast conservation.   We discussed the risks, benefits, and side effects of radiotherapy to the left breast and regional nodes. We discussed that radiation would take approximately 6-7 weeks to complete. Assuming she will need chemotherapy, this would precede RT. We spoke about acute effects including skin irritation and fatigue as well as much less common late effects including lung and heart irritation. We spoke about the latest technology that is used to minimize the risk of late effects for breast cancer patients undergoing radiotherapy. No guarantees of treatment were given. The patient is enthusiastic about proceeding with treatment. Consent was signed and placed in her chart. I look forward to participating in the patient's care. I will not schedule anything more until she is referred back to me by medical oncology. Consult with Dr Jana Hakim  is pending for 8-7 and oncotype testing has been done (score of 22).  She has 5 children and reports they have difficulty dealing with this.  She reports off-handedly some of her children and angry about her diagnosis and say "I am going to kill your doctors" because "they are just going to make you sicker." I do not sense these threats are serious.  I sense her children are angry/confused and have trouble articulating their emotions and fears at this time.  I sense she could greatly benefit from social work for herself and the emotional needs of her children. Will make a referral. __________________________________________   [name], MD

## 2014-05-13 ENCOUNTER — Encounter: Payer: Self-pay | Admitting: *Deleted

## 2014-05-13 ENCOUNTER — Encounter (HOSPITAL_COMMUNITY): Payer: Self-pay

## 2014-05-13 NOTE — Progress Notes (Signed)
Received oncotype score on 22. Informed physician team. Original to MR and copy to Dr. Jana Hakim.

## 2014-05-14 ENCOUNTER — Encounter (INDEPENDENT_AMBULATORY_CARE_PROVIDER_SITE_OTHER): Payer: Self-pay

## 2014-05-14 ENCOUNTER — Ambulatory Visit
Admission: RE | Admit: 2014-05-14 | Discharge: 2014-05-14 | Disposition: A | Discharge status: Home / Self Care | Payer: Medicare Other | Source: Ambulatory Visit | Attending: Radiation Oncology | Admitting: Radiation Oncology

## 2014-05-14 ENCOUNTER — Encounter: Payer: Self-pay | Admitting: Radiation Oncology

## 2014-05-14 ENCOUNTER — Encounter: Payer: Self-pay | Admitting: *Deleted

## 2014-05-14 VITALS — BP 120/68 | HR 99 | Temp 98.0°F | Ht 68.0 in | Wt 260.8 lb

## 2014-05-14 DIAGNOSIS — Z170 Estrogen receptor positive status [ER+]: Secondary | ICD-10-CM | POA: Diagnosis not present

## 2014-05-14 DIAGNOSIS — F2089 Other schizophrenia: Secondary | ICD-10-CM | POA: Diagnosis not present

## 2014-05-14 DIAGNOSIS — C50419 Malignant neoplasm of upper-outer quadrant of unspecified female breast: Secondary | ICD-10-CM | POA: Diagnosis not present

## 2014-05-14 DIAGNOSIS — C773 Secondary and unspecified malignant neoplasm of axilla and upper limb lymph nodes: Secondary | ICD-10-CM | POA: Insufficient documentation

## 2014-05-14 DIAGNOSIS — N6049 Mammary duct ectasia of unspecified breast: Secondary | ICD-10-CM | POA: Insufficient documentation

## 2014-05-14 DIAGNOSIS — Z510 Encounter for antineoplastic radiation therapy: Secondary | ICD-10-CM | POA: Insufficient documentation

## 2014-05-14 DIAGNOSIS — C50412 Malignant neoplasm of upper-outer quadrant of left female breast: Secondary | ICD-10-CM

## 2014-05-14 DIAGNOSIS — F319 Bipolar disorder, unspecified: Secondary | ICD-10-CM | POA: Diagnosis not present

## 2014-05-14 DIAGNOSIS — N63 Unspecified lump in unspecified breast: Secondary | ICD-10-CM | POA: Diagnosis not present

## 2014-05-14 DIAGNOSIS — D649 Anemia, unspecified: Secondary | ICD-10-CM | POA: Insufficient documentation

## 2014-05-14 DIAGNOSIS — F17200 Nicotine dependence, unspecified, uncomplicated: Secondary | ICD-10-CM | POA: Diagnosis not present

## 2014-05-14 DIAGNOSIS — C50211 Malignant neoplasm of upper-inner quadrant of right female breast: Secondary | ICD-10-CM

## 2014-05-14 NOTE — Progress Notes (Signed)
[place] Psychosocial Distress Screening Clinical Social Work  Clinical Social Work was referred by distress screening protocol.  The patient scored a 5 on the Psychosocial Distress Thermometer which indicates moderate distress. Clinical Social Worker phoned pt to assess for distress and other psychosocial needs. CSW spoke to pt's husband who reported she was asleep. CSW left message for pt to call CSW and husband plans to give her that message. Per chart review, pt has schizophrenia and appears to be on appropriate medication at this time. CSW awaits return call and will plan to further assess needs at a future appointment.   ONCBCN DISTRESS SCREENING 05/14/2014  Screening Type Initial Screening  [place] the number that describes how much distress you have been experiencing in the past week 5  Practical problem type Transportation;Food  Emotional problem type Depression;Nervousness/Anxiety;Adjusting to illness;Feeling hopeless  Physical Problem type Pain;Sleep/insomnia  Physician notified of physical symptoms Yes    Clinical Social Worker follow up needed: Yes.    If yes, follow up plan: See above plan  Loren Racer, [place] Worker [place] for [place] Wednesday, Thursday and Friday Phone: [phone] Fax: [phone]

## 2014-05-14 NOTE — Progress Notes (Signed)
Please see the Nurse Progress Note in the MD Initial Consult Encounter for this patient. 

## 2014-05-28 ENCOUNTER — Encounter (INDEPENDENT_AMBULATORY_CARE_PROVIDER_SITE_OTHER): Payer: Self-pay | Admitting: General Surgery

## 2014-05-28 ENCOUNTER — Ambulatory Visit (HOSPITAL_BASED_OUTPATIENT_CLINIC_OR_DEPARTMENT_OTHER): Payer: Medicare Other | Admitting: Oncology

## 2014-05-28 ENCOUNTER — Encounter: Payer: Self-pay | Admitting: *Deleted

## 2014-05-28 ENCOUNTER — Ambulatory Visit (INDEPENDENT_AMBULATORY_CARE_PROVIDER_SITE_OTHER): Payer: Medicare Other | Admitting: General Surgery

## 2014-05-28 ENCOUNTER — Ambulatory Visit: Payer: Medicare Other

## 2014-05-28 ENCOUNTER — Other Ambulatory Visit (HOSPITAL_BASED_OUTPATIENT_CLINIC_OR_DEPARTMENT_OTHER): Payer: Medicare Other

## 2014-05-28 ENCOUNTER — Telehealth: Payer: Self-pay | Admitting: Oncology

## 2014-05-28 ENCOUNTER — Telehealth: Payer: Self-pay | Admitting: *Deleted

## 2014-05-28 VITALS — BP 126/80 | HR 108 | Temp 97.4°F | Ht 69.0 in | Wt 257.0 lb

## 2014-05-28 VITALS — BP 108/71 | HR 102 | Temp 98.2°F | Resp 18 | Ht 69.0 in | Wt 260.4 lb

## 2014-05-28 DIAGNOSIS — C50412 Malignant neoplasm of upper-outer quadrant of left female breast: Secondary | ICD-10-CM

## 2014-05-28 DIAGNOSIS — C50919 Malignant neoplasm of unspecified site of unspecified female breast: Secondary | ICD-10-CM

## 2014-05-28 DIAGNOSIS — Z09 Encounter for follow-up examination after completed treatment for conditions other than malignant neoplasm: Secondary | ICD-10-CM

## 2014-05-28 DIAGNOSIS — Z170 Estrogen receptor positive status [ER+]: Secondary | ICD-10-CM

## 2014-05-28 DIAGNOSIS — C50419 Malignant neoplasm of upper-outer quadrant of unspecified female breast: Secondary | ICD-10-CM

## 2014-05-28 LAB — COMPREHENSIVE METABOLIC PANEL (CC13)
ALT: <6 U/L (ref 0–55)
AST: 10 U/L (ref 5–34)
Albumin: 4.1 g/dL (ref 3.5–5.0)
Alkaline Phosphatase: 76 U/L (ref 40–150)
Anion Gap: 9 mEq/L (ref 3–11)
BUN: 15.8 mg/dL (ref 7.0–26.0)
CO2: 21 mEq/L — ABNORMAL LOW (ref 22–29)
Calcium: 9.6 mg/dL (ref 8.4–10.4)
Chloride: 107 mEq/L (ref 98–109)
Creatinine: 1 mg/dL (ref 0.6–1.1)
Glucose: 103 mg/dl (ref 70–140)
Potassium: 4.5 mEq/L (ref 3.5–5.1)
Sodium: 137 mEq/L (ref 136–145)
Total Bilirubin: 0.37 mg/dL (ref 0.20–1.20)
Total Protein: 7.7 g/dL (ref 6.4–8.3)

## 2014-05-28 LAB — CBC WITH DIFFERENTIAL/PLATELET
BASO%: 0.2 % (ref 0.0–2.0)
Basophils Absolute: 0 10*3/uL (ref 0.0–0.1)
EOS%: 1.2 % (ref 0.0–7.0)
Eosinophils Absolute: 0.1 10*3/uL (ref 0.0–0.5)
HCT: 44.6 % (ref 34.8–46.6)
HGB: 14.6 g/dL (ref 11.6–15.9)
LYMPH%: 40.3 % (ref 14.0–49.7)
MCH: 25.4 pg (ref 25.1–34.0)
MCHC: 32.7 g/dL (ref 31.5–36.0)
MCV: 77.6 fL — ABNORMAL LOW (ref 79.5–101.0)
MONO#: 0.7 10*3/uL (ref 0.1–0.9)
MONO%: 8.4 % (ref 0.0–14.0)
NEUT#: 3.9 10*3/uL (ref 1.5–6.5)
NEUT%: 49.9 % (ref 38.4–76.8)
Platelets: 264 10*3/uL (ref 145–400)
RBC: 5.74 10*6/uL — ABNORMAL HIGH (ref 3.70–5.45)
RDW: 15.9 % — ABNORMAL HIGH (ref 11.2–14.5)
WBC: 7.8 10*3/uL (ref 3.9–10.3)
lymph#: 3.1 10*3/uL (ref 0.9–3.3)

## 2014-05-28 MED ORDER — OXYCODONE-ACETAMINOPHEN 10-325 MG PO TABS: 1.0000 | ORAL_TABLET | Freq: Four times a day (QID) | ORAL | Status: DC | PRN

## 2014-05-28 NOTE — Telephone Encounter (Signed)
per pof to sch appt-sent Benedetto Goad for pre-cert-adv pt I would call once reply

## 2014-05-28 NOTE — Progress Notes (Signed)
[place]  [phone] [phone]     ID: Kimberly [name] DOB: September 13, 1971  MR#: [id number]  HUD#:[id number]  Patient Care Team: [name], DO as PCP - General [name], MD as Consulting Physician (General Surgery) [name], MD as Consulting Physician (Oncology) [name], MD as Attending Physician (Radiation Oncology)  CHIEF COMPLAINT: Estrogen receptor positive breast cancer  CURRENT TREATMENT: Awaiting radiation therapy   BREAST CANCER HISTORY: The patient had bilateral screening mammography with tomography 03/19/2014. This was the patient's first ever mammography. Showed a possible mass in the left breast. Left diagnostic mammography and ultrasonography 04/01/2014 showed an irregular mass in the upper outer quadrant of the left breast with a possible satellite 1 cm anterior to it. On physical exam there was a firm palpable mass at the 1:00 position of the left breast 8 cm from the nipple. There was no palpable left axillary adenopathy. Ultrasound showed an irregular hypoechoic mass in the area in question measuring 1.4 cm. There was a 4 mm nodule located anterior to this. Ultrasound of the left axilla was benign.  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 The second mass was negative for malignancy. This was felt to be concordant.  On 04/08/2014 the patient underwent bilateral breast MRI. This showed a 9 mm enhancing mass in the subareolar area of the right breast in in the left breast, the previously noted mass measuring 1.3 cm. There were no morphologically abnormal lymph nodes  The right breast finding was followed up on  04/19/2014 with ultrasound which showed an intraductal soft tissue mass in the inferior subareolar worsening of the right breast measuring 8 mm. This was biopsied 04/19/2014 and showed ([id number]) ectatic duct, with no evidence of malignancy. Surgical excision was recommended.  Accordingly on 05/03/2014 the patient underwent right lumpectomy, showing an intraductal papilloma, with known malignancy identified. Left lumpectomy on the same day showed an invasive ductal carcinoma measuring 1.5 cm, with one of the 2 sentinel lymph nodes positive for carcinoma. There was no extracapsular extension. Margins were clear and ample. HER-2 was repeated and was again negative.  The patient's case was discussed in the multidisciplinary breast cancer conference 05/12/2014. An Oncotype had been previously requested and this showed a recurrent score of 22, in the intermediate range. The patient qualifies for the S1007 study and this will be discussed with her. Otherwise the standard recommendation would be chemotherapy followed by radiation followed by anti-estrogens.  The patient's subsequent history is as detailed below.  INTERVAL HISTORY: Kimberly [name] was evaluated in the breast clinic 88 when T. 16 accompanied by her husband Kimberly [name].   REVIEW OF SYSTEMS: There were no specific symptoms leading to the original mammogram, which was routinely scheduled. However the patient had a diffusely positive review of systems today. She complained of fever, chills, night sweats, weight change, loss of sleep, fatigue, pain in the right breast and under the arm, which she describes as stabbing and throbbing, hearing loss, gum disease, shortness or breath when walking, poor appetite, nausea and vomiting, change in stool habits, abdominal pain, dribbling urine, easy bruising, headaches, weakness, numbness, forgetfulness, anxiety, depression, and phobias. She admits to having some phlegm most mornings. She  knows she should quit  smoking, but "does not know how".   PAST MEDICAL HISTORY: Past Medical History  Diagnosis Date  . Anemia   . Arthritis   . Schizophrenia   . Bipolar 1 disorder   . Blood transfusion without reported diagnosis 2007    post bleeding childbirth  . Cancer     eval  lt breast cancer  . Heart murmur     as a child-not adult-no cardiac work up  . Diabetes mellitus without complication     gestational    PAST SURGICAL HISTORY: Past Surgical History  Procedure Laterality Date  . Tonsillectomy and adenoidectomy    . Dilation and curettage of uterus      after childbirth  . Multiple tooth extractions      only 5 left  . Breast biopsy Left 04/01/14  . Breast biopsy Right 04/19/14  . Breast lumpectomy with axillary lymph node dissection Bilateral 05/03/14    FAMILY HISTORY No family history on file. The patient's father is currently living at age 77. The patient's mother was murdered at age 32 by the patient's mother's sister. The patient has 2 brothers, one sister. The patient's father hase prostate cancer, diagnosed at age 55. There is no history of breast or ovarian cancer in the family.  GYNECOLOGIC HISTORY:  Patient's last menstrual period was 03/31/2014. Menarche age 50, first live birth age 53, the patient is [place] P5. The patient has not had a period in the last 2 months but tells me she has tested for pregnancy x2 and she is not pregnant.  SOCIAL HISTORY:   the patient tells me she is disabled secondary to her nervous breakdown. She is currently in her third marriage. Her husband, Kimberly [name], is retired from [employer]. He is disabled secondary to seizures. The patient's children are Kimberly [name] and Kimberly [name], age 75 and 28, Kimberly [name] age 54,  And Kimberly [name] and Kimberly [name] age 27 and 41. In addition a nephew, Kimberly [name], 79, lives with her family.    ADVANCED DIRECTIVES:  not in place    HEALTH MAINTENANCE: History  Substance Use Topics  . Smoking status: Current Every Day  Smoker -- 1.00 packs/day for 32 years    Types: Cigarettes  . Smokeless tobacco: Never Used  . Alcohol Use: No     Colonoscopy:  PAP:  Bone density:  Lipid panel:  No Known Allergies  Current Outpatient Prescriptions  Medication Sig Dispense Refill  . acetaminophen (TYLENOL) 500 MG tablet Take 1,000 mg by mouth every 6 (six) hours as needed. Pain      . citalopram (CELEXA) 40 MG tablet Take 0.5-1 tablets (20-40 mg total) by mouth 2 (two) times daily. Takes 1 tab qam and 1/2 tab qhs- Weaning off, per mental health  45 tablet  1  . clonazePAM (KLONOPIN) 1 MG tablet daily.      . ferrous sulfate 325 (65 FE) MG tablet Take 1 tablet (325 mg total) by mouth daily with breakfast.  90 tablet  3  . lamoTRIgine (LAMICTAL) 200 MG tablet TAKE 1 TABLET BY MOUTH TWICE DAILY  60 tablet  2  . oxyCODONE-acetaminophen (PERCOCET) 10-325 MG per tablet Take 1 tablet by mouth every 4 (four) hours as needed for pain.  20 tablet  0  . oxyCODONE-acetaminophen (PERCOCET) 10-325 MG per tablet Take 1-2 tablets by mouth every 6 (six) hours as needed for pain.  30 tablet  0  . QUEtiapine (SEROQUEL) 200 MG tablet Take 1  tablet (200 mg total) by mouth 2 (two) times daily.  60 tablet  5  . risperiDONE (RISPERDAL) 0.25 MG tablet Take by mouth 2 (two) times daily. Unsure of dose. Given by mental health.      . traMADol (ULTRAM) 50 MG tablet TAKE 1 TABLET BY MOUTH EVERY 8 HOURS AS NEEDED  30 tablet  2  . zolpidem (AMBIEN) 10 MG tablet daily.       No current facility-administered medications for this visit.    OBJECTIVE: middle-aged [place] woman in no acute distress  Filed Vitals:   05/28/14 1436  BP: 108/71  Pulse: 102  Temp: 98.2 F (36.8 C)  Resp: 18     Body mass index is 38.44 kg/(m^2).    ECOG FS:1 - Symptomatic but completely ambulatory  Ocular: Sclerae unicteric, pupils equal, round and reactive to light Ear-nose-throat: Oropharynx clear, dentition in poor repair Lymphatic: No cervical or  supraclavicular adenopathy Lungs no rales or rhonchi, good excursion bilaterally Heart regular rate and rhythm, no murmur appreciated Abd soft, obese, nontender, positive bowel sounds MSK no focal spinal tenderness, no joint edema Neuro: non-focal, well-oriented, appropriate affect Breasts: The right breast is status post periareolar biopsy and right axillary biopsy. Both incisions are healing nicely. The cosmetic result is excellent. There are no skin or nipple changes of concern in the right axilla is benign. The left breast is status post lumpectomy and sentinel lymph node sampling. There is no evidence of residual or recurrent disease. 2 left axilla is benign.   LAB RESULTS:  CMP     Component Value Date/Time   NA 137 05/28/2014 1417   NA 140 04/30/2014 0915   K 4.5 05/28/2014 1417   K 4.4 04/30/2014 0915   CL 101 04/30/2014 0915   CO2 21* 05/28/2014 1417   CO2 25 04/30/2014 0915   GLUCOSE 103 05/28/2014 1417   GLUCOSE 79 04/30/2014 0915   BUN 15.8 05/28/2014 1417   BUN 14 04/30/2014 0915   CREATININE 1.0 05/28/2014 1417   CREATININE 1.02 04/30/2014 0915   CREATININE 0.94 02/25/2014 0926   CALCIUM 9.6 05/28/2014 1417   CALCIUM 9.3 04/30/2014 0915   PROT 7.7 05/28/2014 1417   PROT 7.3 04/30/2014 0915   ALBUMIN 4.1 05/28/2014 1417   ALBUMIN 3.8 04/30/2014 0915   AST 10 05/28/2014 1417   AST 15 04/30/2014 0915   ALT <6 05/28/2014 1417   ALT 10 04/30/2014 0915   ALKPHOS 76 05/28/2014 1417   ALKPHOS 77 04/30/2014 0915   BILITOT 0.37 05/28/2014 1417   BILITOT 0.2* 04/30/2014 0915   GFRNONAA 67* 04/30/2014 0915   GFRAA 77* 04/30/2014 0915    I No results found for this basename: SPEP,  UPEP,   kappa and lambda light chains    Lab Results  Component Value Date   WBC 7.8 05/28/2014   NEUTROABS 3.9 05/28/2014   HGB 14.6 05/28/2014   HCT 44.6 05/28/2014   MCV 77.6* 05/28/2014   PLT 264 05/28/2014      Chemistry      Component Value Date/Time   NA 137 05/28/2014 1417   NA 140 04/30/2014 0915   K 4.5 05/28/2014 1417   K  4.4 04/30/2014 0915   CL 101 04/30/2014 0915   CO2 21* 05/28/2014 1417   CO2 25 04/30/2014 0915   BUN 15.8 05/28/2014 1417   BUN 14 04/30/2014 0915   CREATININE 1.0 05/28/2014 1417   CREATININE 1.02 04/30/2014 0915   CREATININE 0.94 02/25/2014 0926  Component Value Date/Time   CALCIUM 9.6 05/28/2014 1417   CALCIUM 9.3 04/30/2014 0915   ALKPHOS 76 05/28/2014 1417   ALKPHOS 77 04/30/2014 0915   AST 10 05/28/2014 1417   AST 15 04/30/2014 0915   ALT <6 05/28/2014 1417   ALT 10 04/30/2014 0915   BILITOT 0.37 05/28/2014 1417   BILITOT 0.2* 04/30/2014 0915       Lab Results  Component Value Date   LABCA2 28 04/30/2014    No components found with this basename: [id number]    No results found for this basename: INR,  in the last 168 hours  Urinalysis    Component Value Date/Time   COLORURINE YELLOW 11/22/2010 1330   APPEARANCEUR CLEAR 11/22/2010 1330   LABSPEC 1.006 11/22/2010 1330   PHURINE 6.0 11/22/2010 1330   HGBUR NEGATIVE 11/22/2010 1330   BILIRUBINUR NEGATIVE 11/22/2010 1330   KETONESUR NEGATIVE 11/22/2010 1330   PROTEINUR NEGATIVE 11/22/2010 1330   UROBILINOGEN 1.0 11/22/2010 1330   NITRITE NEGATIVE 11/22/2010 1330   LEUKOCYTESUR NEGATIVE MICROSCOPIC NOT DONE ON URINES WITH NEGATIVE PROTEIN, BLOOD, LEUKOCYTES, NITRITE, OR GLUCOSE <1000 mg/dL. 11/22/2010 1330    STUDIES: Nm Sentinel Node Inj-no Rpt (breast)  05/03/2014   CLINICAL DATA: left axillary sentinel node biopsy   Sulfur colloid was injected intradermally by the nuclear medicine  technologist for breast cancer sentinel node localization.    Mm Breast Surgical Specimen  05/03/2014   CLINICAL DATA:  Recently diagnosed left breast ductal carcinoma in situ. The patient had an additional left breast biopsy with benign results.  EXAM: SPECIMEN RADIOGRAPH OF THE LEFT BREAST  COMPARISON:  Previous exam(s)  FINDINGS: Status post excision of the left breast. The radioactive seed, coil shaped biopsy marker clip and irregular mass are present and are marked for  pathology. There is also a ribbon shaped biopsy marker clip in the periphery of the specimen, marking the previous biopsy with benign results. This was also marked for pathology.  IMPRESSION: Specimen radiograph of the left breast.   Electronically Signed   By: Enrique Sack M.D.   On: 05/03/2014 12:28   Mm Breast Surgical Specimen  05/03/2014   CLINICAL DATA:  8 mm mass in the 6 o'clock retroareolar region of the right breast on a recent breast MRI. The patient had a subsequent ultrasound-guided core needle biopsy with discordant results. Surgical excision was recommended.  EXAM: SPECIMEN RADIOGRAPH OF THE RIGHT BREAST  COMPARISON:  Previous exam(s)  FINDINGS: Status post excision of the right breast. The radioactive seed and biopsy marker clip are present and are marked for pathology.  IMPRESSION: Specimen radiograph of the right breast.   Electronically Signed   By: Enrique Sack M.D.   On: 05/03/2014 12:26   Mm Lt Plc Breast Loc Dev   1st Lesion  Inc Mammo Guide  04/29/2014   CLINICAL DATA:  Patient with recent diagnosis left breast carcinoma. For preoperative localization prior to left breast lumpectomy.  EXAM: MAMMOGRAPHIC GUIDED RADIOACTIVE SEED LOCALIZATION OF THE LEFT BREAST  COMPARISON:  Previous exam(s)  FINDINGS: Patient presents for radioactive seed localization prior to left breast lumpectomy. I met with the patient and we discussed the procedure of seed localization including benefits and alternatives. We discussed the high likelihood of a successful procedure. We discussed the risks of the procedure including infection, bleeding, tissue injury and further surgery. We discussed the low dose of radioactivity involved in the procedure. Informed, written consent was given.  The usual time-out protocol was performed immediately  prior to the procedure.  Using mammographic guidance, sterile technique, 2% lidocaine and an I-125 radioactive seed, coil shaped biopsy marking clip and spiculated mass within the  posterior left breast laterally were localized using a cranial approach. The follow-up mammogram images confirm the seed in the expected location and are marked for Dr. Donne Hazel.  Follow-up survey of the patient confirms presents of radioactive seed adjacent to the biopsy marking clip.  Order number of [id number] seed:  [id number].  Dose of I-125 seed:  0.252.  The patient tolerated the procedure well and was released from the [facility]. She was given instructions regarding seed removal.  IMPRESSION: Radioactive seed localization spiculated mass within the left breast. No apparent complications.   Electronically Signed   By: Lovey Newcomer M.D.   On: 04/29/2014 09:20   Mm Rt Plc Breast Loc Dev   1st Lesion  Inc Mammo Guide  04/29/2014   CLINICAL DATA:  Patient with enhancing mass on preoperative MRI within the subareolar right breast. She underwent benign ultrasound-guided core needle biopsy. Surgical excision of this location was recommended.  EXAM: MAMMOGRAPHIC GUIDED RADIOACTIVE SEED LOCALIZATION OF THE RIGHT BREAST  COMPARISON:  Previous exam(s)  FINDINGS: Patient presents for radioactive seed localization prior to right breast surgical excision. I met with the patient and we discussed the procedure of seed localization including benefits and alternatives. We discussed the high likelihood of a successful procedure. We discussed the risks of the procedure including infection, bleeding, tissue injury and further surgery. We discussed the low dose of radioactivity involved in the procedure. Informed, written consent was given.  The usual time-out protocol was performed immediately prior to the procedure.  Using mammographic guidance, sterile technique, 2% lidocaine and an I-125 radioactive seed, biopsy marking clip within the subareolar right breast was localized using a cranial approach. The follow-up mammogram images confirm the seed in the expected location and are marked for Dr. Donne Hazel.  Follow-up survey of  the patient confirms presence of radioactive seed adjacent to the biopsy marking clip.  Order number of I-125 seed:  [id number].  Dose of I-125 seed:  0.252 mCi.  The patient tolerated the procedure well and was released from the Breast Center. She was given instructions regarding seed removal.  IMPRESSION: Radioactive seed localization right breast. No apparent complications.   Electronically Signed   By: Lovey Newcomer M.D.   On: 04/29/2014 09:22    ASSESSMENT: 43 y.o. BRCA negative [place] woman status post right lumpectomy and sentinel lymph node sampling 05/03/2014 for a pT1c pN0, stage IA invasive ductal carcinoma, grade 2, estrogen receptor 83% positive, progesterone receptor 66% positive, with an MIB-1 of 14% and no HER-2 amplification.  (1) Oncotype score of 22 predicts a risk of outside the breast recurrence within 10 years of 13% if the patient's only systemic therapy is tamoxifen for 5 years.  (2) adjuvant chemotherapy with cyclophosphamide and docetaxel to start 07/06/2014  (3) adjuvant radiation to follow chemotherapy  (4) adjuvant antiestrogen therapy for 5-10 years to follow radiation  PLAN: We spent the better part of today's hour-long appointment discussing the biology of breast cancer in general, and the specifics of the patient's tumor in particular. Dimitria understands the difference between local and systemic therapy for breast cancer. She will need radiation to complete her local treatment. Generally this is done after chemotherapy. Antiestrogen pills then follow radiation  However we spent most of the visit today discussing the need for chemotherapy. The patient qualifies for our S1007 study (2  positive lymph nodes, Oncotype less than 25). She understands that she enrolled in the study she will have a 50% chance of not receiving chemotherapy. We discussed the possible toxicities, side effects and complications of chemotherapy in her case. After much discussion however she  felt she really wanted to proceed with chemotherapy because she "wants everything possible done" so she can live to see her children all grow up.  Accordingly the next step is for chemotherapy. She will need a port placed. She will come to chemotherapy school, and she will return to see me mid September to discuss supportive and antinausea medications. The plan is to start her chemotherapy on September 15.  I strongly counseled the patient to discontinue smoking, as low oxygen levels may interfere with both the planned chemotherapy and radiation treatments.  The patient has a good understanding of the overall plan. She agrees with it. She knows the goal of treatment in her case is cure. She will call with any problems that may develop before her next visit here.  [name], MD   05/28/2014 8:16 PM

## 2014-05-28 NOTE — Telephone Encounter (Signed)
per p[facility] to sch appt-[facility] sch trmt-gave pt copty of sch

## 2014-05-28 NOTE — Progress Notes (Signed)
[facility] Work  Clinical Social Work met with pt and her husband prior to MD appointment. Pt reports to have some issues with transportation and may need assistance with gas for appointments. [place] to assist. Pt may qualify for gas assistance and CSW educate pt about this. Pt reports to have five children ages 87, 59, 82, 37 and 42. She has limited family support and shared family has had strained relations since her mother was killed six years ago. Pt currently is seen at the [facility] for assistance with her mental healthcare needs. She feels she has good support from them currently. CSW inquired if pt had needs that we should be aware during her cancer care and she reports to have some fears of loud places and doesn't liked to be touched a lot. Pt appears very open to support and advocating for her needs. CSW educated her about the many resources of [facility] support team, groups, classes and CSWs. She is very interested and plans to reach out to CSW as needed.   Clinical Social Work interventions: Assessment Problem solving Emotional support Resource education  Loren Racer, [place] Clinical Social Worker [facility] [place] for [place] Wednesday, Thursday and Friday Phone: [phone] Fax: [phone]

## 2014-05-28 NOTE — Telephone Encounter (Signed)
Per [facility] staff phone call scheduled appts. Advised schedulers 

## 2014-05-29 NOTE — Progress Notes (Signed)
Subjective:     Patient ID: Kimberly [name], female   DOB: 09-17-1971, 43 y.o.   MRN: [id number]  HPI 62 yof s/p excision of accessory right axillary breast tissue, excision of right breast abnormality that is papilloma as well as left breast lumpectomy with snbx.  She has done well except for mostly right sided pain.  Her path returns as 1.5 cm margin negative IDC, grade II, er pos at 83, pr pos at 66, Ki 14%, her 2 negative, 1/2 nodes positive.  Oncotype is 22.  Review of Systems     Objective:   Physical Exam Healing right axillary and right breast incisions without infection Healing left breast and axillary incisions without infection    Assessment:     Stage II left breast cancer     Plan:     She can do what she wants.  I gave her info on [facility] pt class and encouraged her to go.  She has intermediate oncotype but positive node. I suspect she will need chemo but will wait until she sees Dr Jana Hakim later today.

## 2014-06-01 ENCOUNTER — Encounter: Payer: Self-pay | Admitting: Genetic Counselor

## 2014-06-01 ENCOUNTER — Telehealth: Payer: Self-pay | Admitting: Oncology

## 2014-06-01 DIAGNOSIS — C50412 Malignant neoplasm of upper-outer quadrant of left female breast: Secondary | ICD-10-CM

## 2014-06-01 NOTE — Telephone Encounter (Signed)
er pof to sch pt ECHO-cld pt with time & date location-pt underetood

## 2014-06-01 NOTE — Progress Notes (Addendum)
HPI:  [name] was previously seen in the [facility] due to a personal and family history of cancer and concerns regarding a hereditary predisposition to cancer. Please refer to our prior cancer genetics clinic note for more information regarding [name]'s medical, social and family histories, and our assessment and recommendations, at the time. [name] recent genetic test results were disclosed to her, as were recommendations warranted by these results. These results and recommendations are discussed in more detail below.  GENETIC TEST RESULTS: At the time of [name]'s visit, we recommended she pursue genetic testing of the BreastNext gene panel. This test, which included sequencing and deletion/duplication analysis of the genes, was performed at [facility]. Genetic testing was normal, and did not reveal a deleterious mutation in these genes. A complete list of all genes tested is located on the test report scanned into EPIC.    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 At this time, it is unknown if this variant is associated with an increased risk for cancer or if this is a normal finding. With time, we suspect the lab will reclassify this variant and when they do, we will try to re-contact [name] to discuss the reclassification further.   We discussed with [name] that since the current genetic testing is not perfect, it is possible there may be a gene mutation in one of these genes that current testing cannot detect, but that chance is small.  We also discussed, that it is possible that another gene that has not yet been discovered, or that we have not yet tested, is responsible for the cancer diagnoses in the family, and it is, therefore, important to remain in touch with cancer genetics in the future so that we can continue to offer [name] the most up to date genetic testing.   CANCER SCREENING  RECOMMENDATIONS: This result is reassuring and suggests that [name]'s cancer was most likely not due to an inherited predisposition associated with one of these genes.  Most cancers happen by chance and this negative test, along with details of her family history, suggests that her cancer falls into this category.  We, therefore, recommended she continue to follow the cancer management and screening guidelines provided by her oncology and primary providers.   RECOMMENDATIONS FOR FAMILY MEMBERS:  Women in this family might be at some increased risk of developing cancer, over the general population risk, simply due to the family history of cancer.  We recommended women in this family have a yearly mammogram beginning at age 77, an an annual clinical breast exam, and perform monthly breast self-exams. Women in this family should also have a gynecological exam as recommended by their primary provider. All family members should have a colonoscopy by age 80.  FOLLOW-UP: Lastly, we discussed with [name] that cancer genetics is a rapidly advancing field and it is possible that new genetic tests will be appropriate for her and/or her family members in the future. We encouraged her to remain in contact with cancer genetics on an annual basis so we can update her personal and family histories and let her know of advances in cancer genetics that may benefit this family.   Our contact number was provided. [name] questions were answered to her satisfaction, and she knows she is welcome to call us at anytime with additional questions or concerns.   Kimberly A. 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 Kimberly Quail, MS, CGC

## 2014-06-08 ENCOUNTER — Other Ambulatory Visit (INDEPENDENT_AMBULATORY_CARE_PROVIDER_SITE_OTHER): Payer: Self-pay

## 2014-06-08 ENCOUNTER — Telehealth (INDEPENDENT_AMBULATORY_CARE_PROVIDER_SITE_OTHER): Payer: Self-pay

## 2014-06-08 MED ORDER — OXYCODONE-ACETAMINOPHEN 5-325 MG PO TABS: 1.0000 | ORAL_TABLET | ORAL | Status: DC | PRN

## 2014-06-08 NOTE — Telephone Encounter (Signed)
Pt is s/p breast surgery 05/03/14 by Dr. Donne Hazel.  She is requesting a refill of Percocet.  Her last refill

## 2014-06-08 NOTE — Telephone Encounter (Signed)
Breast cancer pt of Dr. Donne Hazel who is s/p lumpectomy on 05/03/14.  She is requesting a refill of her Percocet.  She received #30 on 05/28/14.  She says she still needs pain medication.  Dr. Donne Hazel is unavailable this week so I told the patient that one of his partners would review her request and we would call her back.

## 2014-06-08 NOTE — Telephone Encounter (Signed)
Informed pt that there was a rx for Percocet 5/325mg  at the front desk for her to pick up. Pt verbalized understanding

## 2014-06-08 NOTE — Telephone Encounter (Signed)
Husband called to inform us patient needs refill of oxycodone, ask to speak to patient . Husband states wife has gone to the store . Advised him to have wife call back

## 2014-06-11 ENCOUNTER — Ambulatory Visit (HOSPITAL_COMMUNITY)
Admission: RE | Admit: 2014-06-11 | Discharge: 2014-06-11 | Disposition: A | Discharge status: Home / Self Care | Payer: Medicare Other | Source: Ambulatory Visit | Attending: Internal Medicine | Admitting: Internal Medicine

## 2014-06-11 ENCOUNTER — Other Ambulatory Visit: Payer: Medicare Other

## 2014-06-11 DIAGNOSIS — Z5111 Encounter for antineoplastic chemotherapy: Secondary | ICD-10-CM | POA: Insufficient documentation

## 2014-06-11 DIAGNOSIS — C50419 Malignant neoplasm of upper-outer quadrant of unspecified female breast: Secondary | ICD-10-CM | POA: Insufficient documentation

## 2014-06-11 DIAGNOSIS — C50412 Malignant neoplasm of upper-outer quadrant of left female breast: Secondary | ICD-10-CM

## 2014-06-11 DIAGNOSIS — I359 Nonrheumatic aortic valve disorder, unspecified: Secondary | ICD-10-CM

## 2014-06-11 NOTE — Progress Notes (Signed)
Echocardiogram 2D Echocardiogram has been performed.  Kimberly [name] 06/11/2014, 1:10 PM

## 2014-06-15 ENCOUNTER — Encounter (HOSPITAL_BASED_OUTPATIENT_CLINIC_OR_DEPARTMENT_OTHER): Payer: Self-pay | Admitting: *Deleted

## 2014-06-15 NOTE — Progress Notes (Signed)
Pt here 7/15 for surgery-no new labs needed-did well-stayed overnight-

## 2014-06-18 ENCOUNTER — Ambulatory Visit (HOSPITAL_BASED_OUTPATIENT_CLINIC_OR_DEPARTMENT_OTHER): Payer: Medicare Other | Admitting: Anesthesiology

## 2014-06-18 ENCOUNTER — Encounter (HOSPITAL_BASED_OUTPATIENT_CLINIC_OR_DEPARTMENT_OTHER): Payer: Medicare Other | Admitting: Anesthesiology

## 2014-06-18 ENCOUNTER — Ambulatory Visit (HOSPITAL_COMMUNITY): Payer: Medicare Other

## 2014-06-18 ENCOUNTER — Ambulatory Visit (HOSPITAL_BASED_OUTPATIENT_CLINIC_OR_DEPARTMENT_OTHER)
Admission: RE | Admit: 2014-06-18 | Discharge: 2014-06-18 | Disposition: A | Discharge status: Home / Self Care | Payer: Medicare Other | Source: Ambulatory Visit | Attending: General Surgery | Admitting: General Surgery

## 2014-06-18 ENCOUNTER — Encounter (HOSPITAL_BASED_OUTPATIENT_CLINIC_OR_DEPARTMENT_OTHER): Payer: Self-pay | Admitting: Anesthesiology

## 2014-06-18 ENCOUNTER — Encounter (HOSPITAL_BASED_OUTPATIENT_CLINIC_OR_DEPARTMENT_OTHER)
Admission: RE | Disposition: A | Discharge status: Home / Self Care | Payer: Self-pay | Source: Ambulatory Visit | Attending: General Surgery

## 2014-06-18 DIAGNOSIS — Z6838 Body mass index (BMI) 38.0-38.9, adult: Secondary | ICD-10-CM | POA: Diagnosis not present

## 2014-06-18 DIAGNOSIS — C50919 Malignant neoplasm of unspecified site of unspecified female breast: Secondary | ICD-10-CM | POA: Insufficient documentation

## 2014-06-18 DIAGNOSIS — F17200 Nicotine dependence, unspecified, uncomplicated: Secondary | ICD-10-CM | POA: Insufficient documentation

## 2014-06-18 HISTORY — PX: PORTACATH PLACEMENT: SHX2246

## 2014-06-18 LAB — POCT HEMOGLOBIN-HEMACUE: Hemoglobin: 15.3 g/dL — ABNORMAL HIGH (ref 12.0–15.0)

## 2014-06-18 SURGERY — INSERTION, TUNNELED CENTRAL VENOUS DEVICE, WITH PORT
Anesthesia: General | Site: Chest | Laterality: Right

## 2014-06-18 MED ORDER — OXYCODONE-ACETAMINOPHEN 5-325 MG PO TABS
ORAL_TABLET | ORAL | Status: AC
  Filled 2014-06-18: qty 1

## 2014-06-18 MED ORDER — HEPARIN (PORCINE) IN NACL 2-0.9 UNIT/ML-% IJ SOLN
INTRAMUSCULAR | Status: DC | PRN
  Administered 2014-06-18: 1 via INTRAVENOUS

## 2014-06-18 MED ORDER — MIDAZOLAM HCL 2 MG/2ML IJ SOLN: 1.0000 mg | INTRAMUSCULAR | Status: DC | PRN

## 2014-06-18 MED ORDER — HEPARIN (PORCINE) IN NACL 2-0.9 UNIT/ML-% IJ SOLN
INTRAMUSCULAR | Status: AC
  Filled 2014-06-18: qty 500

## 2014-06-18 MED ORDER — CEFAZOLIN (ANCEF) 1 G IV SOLR
2.0000 g | INTRAVENOUS | Status: AC
  Administered 2014-06-18: 2 g

## 2014-06-18 MED ORDER — CEFAZOLIN SODIUM-DEXTROSE 2-3 GM-% IV SOLR
INTRAVENOUS | Status: AC
  Filled 2014-06-18: qty 50

## 2014-06-18 MED ORDER — LACTATED RINGERS IV SOLN
INTRAVENOUS | Status: DC
  Administered 2014-06-18: 08:00:00 via INTRAVENOUS

## 2014-06-18 MED ORDER — PROPOFOL 10 MG/ML IV BOLUS
INTRAVENOUS | Status: DC | PRN
  Administered 2014-06-18: 200 mg via INTRAVENOUS

## 2014-06-18 MED ORDER — BUPIVACAINE HCL (PF) 0.25 % IJ SOLN
INTRAMUSCULAR | Status: DC | PRN
  Administered 2014-06-18: 10 mL

## 2014-06-18 MED ORDER — MIDAZOLAM HCL 2 MG/2ML IJ SOLN
INTRAMUSCULAR | Status: AC
  Filled 2014-06-18: qty 2

## 2014-06-18 MED ORDER — OXYCODONE-ACETAMINOPHEN 5-325 MG PO TABS
1.0000 | ORAL_TABLET | Freq: Once | ORAL | Status: AC
  Administered 2014-06-18: 1 via ORAL

## 2014-06-18 MED ORDER — OXYCODONE-ACETAMINOPHEN 10-325 MG PO TABS: 1.0000 | ORAL_TABLET | Freq: Four times a day (QID) | ORAL | Status: DC | PRN

## 2014-06-18 MED ORDER — ONDANSETRON HCL 4 MG/2ML IJ SOLN: 4.0000 mg | Freq: Once | INTRAMUSCULAR | Status: DC | PRN

## 2014-06-18 MED ORDER — ONDANSETRON HCL 4 MG/2ML IJ SOLN
INTRAMUSCULAR | Status: DC | PRN
  Administered 2014-06-18: 4 mg via INTRAVENOUS

## 2014-06-18 MED ORDER — BUPIVACAINE HCL (PF) 0.25 % IJ SOLN
INTRAMUSCULAR | Status: AC
  Filled 2014-06-18: qty 30

## 2014-06-18 MED ORDER — HEPARIN SOD (PORK) LOCK FLUSH 100 UNIT/ML IV SOLN
INTRAVENOUS | Status: DC | PRN
  Administered 2014-06-18: 500 [IU] via INTRAVENOUS

## 2014-06-18 MED ORDER — FENTANYL CITRATE 0.05 MG/ML IJ SOLN
INTRAMUSCULAR | Status: DC | PRN
  Administered 2014-06-18: 100 ug via INTRAVENOUS

## 2014-06-18 MED ORDER — FENTANYL CITRATE 0.05 MG/ML IJ SOLN
INTRAMUSCULAR | Status: AC
  Filled 2014-06-18: qty 4

## 2014-06-18 MED ORDER — FENTANYL CITRATE 0.05 MG/ML IJ SOLN: 25.0000 ug | INTRAMUSCULAR | Status: DC | PRN

## 2014-06-18 MED ORDER — MIDAZOLAM HCL 5 MG/5ML IJ SOLN
INTRAMUSCULAR | Status: DC | PRN
  Administered 2014-06-18: 2 mg via INTRAVENOUS

## 2014-06-18 MED ORDER — LIDOCAINE HCL (CARDIAC) 20 MG/ML IV SOLN
INTRAVENOUS | Status: DC | PRN
  Administered 2014-06-18: 50 mg via INTRAVENOUS

## 2014-06-18 MED ORDER — DEXAMETHASONE SODIUM PHOSPHATE 4 MG/ML IJ SOLN
INTRAMUSCULAR | Status: DC | PRN
  Administered 2014-06-18: 10 mg via INTRAVENOUS

## 2014-06-18 MED ORDER — HEPARIN SOD (PORK) LOCK FLUSH 100 UNIT/ML IV SOLN
INTRAVENOUS | Status: AC
  Filled 2014-06-18: qty 5

## 2014-06-18 MED ORDER — FENTANYL CITRATE 0.05 MG/ML IJ SOLN: 50.0000 ug | INTRAMUSCULAR | Status: DC | PRN

## 2014-06-18 SURGICAL SUPPLY — 49 items
ADH SKN CLS APL DERMABOND .7 (GAUZE/BANDAGES/DRESSINGS) ×1
APL SKNCLS STERI-STRIP NONHPOA (GAUZE/BANDAGES/DRESSINGS) ×1
BAG DECANTER FOR FLEXI CONT (MISCELLANEOUS) ×2 IMPLANT
BENZOIN TINCTURE PRP APPL 2/3 (GAUZE/BANDAGES/DRESSINGS) ×2 IMPLANT
BLADE SURG 11 STRL SS (BLADE) ×2 IMPLANT
BLADE SURG 15 STRL LF DISP TIS (BLADE) ×1 IMPLANT
BLADE SURG 15 STRL SS (BLADE) ×2
CANISTER SUCT 1200ML W/VALVE (MISCELLANEOUS) IMPLANT
CHLORAPREP W/TINT 26ML (MISCELLANEOUS) ×2 IMPLANT
COVER MAYO STAND STRL (DRAPES) ×2 IMPLANT
COVER TABLE BACK 60X90 (DRAPES) ×2 IMPLANT
DECANTER SPIKE VIAL GLASS SM (MISCELLANEOUS) IMPLANT
DERMABOND ADVANCED (GAUZE/BANDAGES/DRESSINGS) ×1
DERMABOND ADVANCED .7 DNX12 (GAUZE/BANDAGES/DRESSINGS) ×1 IMPLANT
DRAPE C-ARM 42X72 X-RAY (DRAPES) ×2 IMPLANT
DRAPE LAPAROSCOPIC ABDOMINAL (DRAPES) ×2 IMPLANT
DRSG TEGADERM 4X4.75 (GAUZE/BANDAGES/DRESSINGS) IMPLANT
ELECT COATED BLADE 2.86 ST (ELECTRODE) ×2 IMPLANT
ELECT REM PT RETURN 9FT ADLT (ELECTROSURGICAL) ×2
ELECTRODE REM PT RTRN 9FT ADLT (ELECTROSURGICAL) ×1 IMPLANT
GLOVE BIO SURGEON STRL SZ7 (GLOVE) ×3 IMPLANT
GLOVE BIOGEL PI IND STRL 7.5 (GLOVE) ×1 IMPLANT
GLOVE BIOGEL PI INDICATOR 7.5 (GLOVE) ×2
GOWN STRL REUS W/ TWL LRG LVL3 (GOWN DISPOSABLE) ×4 IMPLANT
GOWN STRL REUS W/TWL LRG LVL3 (GOWN DISPOSABLE) ×4
IV KIT MINILOC 20X1 SAFETY (NEEDLE) IMPLANT
KIT PORT POWER 8FR ISP CVUE (Catheter) ×1 IMPLANT
NDL HYPO 25X1 1.5 SAFETY (NEEDLE) ×1 IMPLANT
NDL SAFETY ECLIPSE 18X1.5 (NEEDLE) IMPLANT
NEEDLE HYPO 18GX1.5 SHARP (NEEDLE)
NEEDLE HYPO 25X1 1.5 SAFETY (NEEDLE) ×2 IMPLANT
PACK BASIN DAY SURGERY FS (CUSTOM PROCEDURE TRAY) ×2 IMPLANT
PENCIL BUTTON HOLSTER BLD 10FT (ELECTRODE) ×2 IMPLANT
SLEEVE SCD COMPRESS KNEE MED (MISCELLANEOUS) ×2 IMPLANT
SPONGE GAUZE 4X4 12PLY STER LF (GAUZE/BANDAGES/DRESSINGS) ×2 IMPLANT
STAPLER VISISTAT 35W (STAPLE) ×2 IMPLANT
STRIP CLOSURE SKIN 1/2X4 (GAUZE/BANDAGES/DRESSINGS) ×2 IMPLANT
SUT MON AB 4-0 PC3 18 (SUTURE) ×2 IMPLANT
SUT PROLENE 2 0 SH DA (SUTURE) ×2 IMPLANT
SUT SILK 2 0 TIES 17X18 (SUTURE)
SUT SILK 2-0 18XBRD TIE BLK (SUTURE) IMPLANT
SUT VIC AB 3-0 SH 27 (SUTURE) ×2
SUT VIC AB 3-0 SH 27X BRD (SUTURE) ×1 IMPLANT
SYR 5ML LUER SLIP (SYRINGE) ×2 IMPLANT
SYR CONTROL 10ML LL (SYRINGE) ×2 IMPLANT
TOWEL OR 17X24 6PK STRL BLUE (TOWEL DISPOSABLE) ×3 IMPLANT
TOWEL OR NON WOVEN STRL DISP B (DISPOSABLE) ×1 IMPLANT
TUBE CONNECTING 20X1/4 (TUBING) IMPLANT
YANKAUER SUCT BULB TIP NO VENT (SUCTIONS) IMPLANT

## 2014-06-18 NOTE — Interval H&P Note (Signed)
History and Physical Interval Note:  06/18/2014 8:24 AM  Kimberly [name]  has presented today for surgery, with the diagnosis of Breast Cancer  The various methods of treatment have been discussed with the patient and family. After consideration of risks, benefits and other options for treatment, the patient has consented to  Procedure(s): INSERTION PORT-A-CATH (N/A) as a surgical intervention .  The patient's history has been reviewed, patient examined, no change in status, stable for surgery.  I have reviewed the patient's chart and labs.  Questions were answered to the patient's satisfaction.     Ziere Docken

## 2014-06-18 NOTE — Anesthesia Procedure Notes (Signed)
Procedure Name: LMA Insertion Date/Time: 06/18/2014 8:43 AM Performed by: Lieutenant Diego Pre-anesthesia Checklist: Patient identified, Emergency Drugs available, Suction available and Patient being monitored Patient Re-evaluated:Patient Re-evaluated prior to inductionOxygen Delivery Method: Circle System Utilized Preoxygenation: Pre-oxygenation with 100% oxygen Intubation Type: IV induction Ventilation: Mask ventilation without difficulty LMA: LMA inserted LMA Size: 4.0 Number of attempts: 1 Airway Equipment and Method: bite block Placement Confirmation: positive ETCO2 and breath sounds checked- equal and bilateral Tube secured with: Tape Dental Injury: Teeth and Oropharynx as per pre-operative assessment

## 2014-06-18 NOTE — Anesthesia Preprocedure Evaluation (Addendum)
Anesthesia Evaluation  Patient identified by MRN, date of birth, ID band Patient awake    Reviewed: Allergy & Precautions, H&P , NPO status , Patient's Chart, lab work & pertinent test results  Airway Mallampati: I TM Distance: >3 FB Neck ROM: Full    Dental  (+) Missing   Pulmonary Current Smoker,  breath sounds clear to auscultation        Cardiovascular Rhythm:Regular Rate:Normal     Neuro/Psych    GI/Hepatic   Endo/Other  neg diabetesMorbid obesity  Renal/GU      Musculoskeletal   Abdominal   Peds  Hematology   Anesthesia Other Findings   Reproductive/Obstetrics                          Anesthesia Physical Anesthesia Plan  ASA: III  Anesthesia Plan: General   Post-op Pain Management:    Induction: Intravenous  Airway Management Planned: LMA  Additional Equipment:   Intra-op Plan:   Post-operative Plan: Extubation in OR  Informed Consent: I have reviewed the patients History and Physical, chart, labs and discussed the procedure including the risks, benefits and alternatives for the proposed anesthesia with the patient or authorized representative who has indicated his/her understanding and acceptance.   Dental advisory given  Plan Discussed with: CRNA, Anesthesiologist and Surgeon  Anesthesia Plan Comments:         Anesthesia Quick Evaluation

## 2014-06-18 NOTE — Discharge Instructions (Signed)
° ° °PORT-A-CATH: POST OP INSTRUCTIONS ° °Always review your discharge instruction sheet given to you by the facility where your surgery was performed.  ° °1. A prescription for pain medication may be given to you upon discharge. Take your pain medication as prescribed, if needed. If narcotic pain medicine is not needed, then you make take acetaminophen (Tylenol) or ibuprofen (Advil) as needed.  °2. Take your usually prescribed medications unless otherwise directed. °3. If you need a refill on your pain medication, please contact our office. All narcotic pain medicine now requires a paper prescription.  Phoned in and fax refills are no longer allowed by law.  Prescriptions will not be filled after 5 pm or on weekends.  °4. You should follow a light diet for the remainder of the day after your procedure. °5. Most patients will experience some mild swelling and/or bruising in the area of the incision. It may take several days to resolve. °6. It is common to experience some constipation if taking pain medication after surgery. Increasing fluid intake and taking a stool softener (such as Colace) will usually help or prevent this problem from occurring. A mild laxative (Milk of Magnesia or Miralax) should be taken according to package directions if there are no bowel movements after 48 hours.  °7. Unless discharge instructions indicate otherwise, you may remove your bandages 48 hours after surgery, and you may shower at that time. You may have steri-strips (small white skin tapes) in place directly over the incision.  These strips should be left on the skin for 7-10 days.  If your surgeon used Dermabond (skin glue) on the incision, you may shower in 24 hours.  The glue will flake off over the next 2-3 weeks.  °8. If your port is left accessed at the end of surgery (needle left in port), the dressing cannot get wet and should only by changed by a healthcare professional. When the port is no longer accessed (when the  needle has been removed), follow step 7.   °9. ACTIVITIES:  Limit activity involving your arms for the next 72 hours. Do no strenuous exercise or activity for 1 week. You may drive when you are no longer taking prescription pain medication, you can comfortably wear a seatbelt, and you can maneuver your car. °10.You may need to see your doctor in the office for a follow-up appointment.  Please °      check with your doctor.  °11.When you receive a new Port-a-Cath, you will get a product guide and  °      ID card.  Please keep them in case you need them. ° °WHEN TO CALL YOUR DOCTOR ([phone]): °1. Fever over 101.0 °2. Chills °3. Continued bleeding from incision °4. Increased redness and tenderness at the site °5. Shortness of breath, difficulty breathing ° ° °The clinic staff is available to answer your questions during regular business hours. Please don’t hesitate to call and ask to speak to one of the nurses or medical assistants for clinical concerns. If you have a medical emergency, go to the nearest emergency room or call 911.  A surgeon from [facility] is always on call at the hospital.  ° ° ° °For further information, please visit [email or web address] ° ° °Post Anesthesia Home Care Instructions ° °Activity: °Get plenty of rest for the remainder of the day. A responsible adult should stay with you for 24 hours following the procedure.  °For the next 24 hours, DO NOT: °-Drive a car °-  Operate machinery °-Drink alcoholic beverages °-Take any medication unless instructed by your physician °-Make any legal decisions or sign important papers. ° °Meals: °Start with liquid foods such as gelatin or soup. Progress to regular foods as tolerated. Avoid greasy, spicy, heavy foods. If nausea and/or vomiting occur, drink only clear liquids until the nausea and/or vomiting subsides. Call your physician if vomiting continues. ° °Special Instructions/Symptoms: °Your throat may feel dry or sore from the  anesthesia or the breathing tube placed in your throat during surgery. If this causes discomfort, gargle with warm salt water. The discomfort should disappear within 24 hours. ° ° ° ° ° ° °

## 2014-06-18 NOTE — Anesthesia Postprocedure Evaluation (Signed)
  Anesthesia Post-op Note  Patient: Kimberly [name]  Procedure(s) Performed: Procedure(s): INSERTION PORT-A-CATH (Right)  Patient Location: PACU  Anesthesia Type: General   Level of Consciousness: awake, alert  and oriented  Airway and Oxygen Therapy: Patient Spontanous Breathing  Post-op Pain: mild  Post-op Assessment: Post-op Vital signs reviewed  Post-op Vital Signs: Reviewed  Last Vitals:  Filed Vitals:   06/18/14 1000  BP: 124/74  Pulse: 97  Temp:   Resp: 15    Complications: No apparent anesthesia complications

## 2014-06-18 NOTE — H&P (Signed)
Kimberly [name] who got her first mm as "god told her to". She had no complaints referable to either breast and no past history. There is no family history present. Her breast density is c. Mm of the left breast shows an irregular mass located in uoq with a possible satellite near this. US shows a 1.4x1x.6 cm mass and a subtle small 4 mm mass located 39mm anterior and inferior. She underwent biopsy of both of these lesions. 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 The other biopsy which is read as concordant that is benign breast tissue. This appears to be grade I-II. She has now undergone lumpectomy with snbx and has intermediate oncotype.  She has discussed chemotherapy with oncology and is due to begin on 15 September   Past Medical History   Diagnosis  Date   .  Anemia    .  Arthritis    .  Schizophrenia    .  Bipolar 1 disorder    .  Diabetes mellitus without complication      gestational   .  Blood transfusion without reported diagnosis  2007   .  Cancer      eval lt breast cancer   .  Heart murmur      Past Surgical History   Procedure  Laterality  Date   .  Dilation and curettage of uterus     .  Tonsillectomy and adenoidectomy     Left breast lumpectomy/snbx Right breast biopsy Right axillary tissue excision, accessory breast tissue   History reviewed. No pertinent family history.   Social History  History   Substance Use Topics   .  Smoking status:  Current Every Day Smoker -- 1.00 packs/day     Types:  Cigarettes   .  Smokeless tobacco:  Not on file   .  Alcohol Use:  No    No Known Allergies   Current Outpatient Prescriptions   Medication  Sig  Dispense  Refill   .  acetaminophen (TYLENOL) 500 MG tablet  Take 1,000 mg by mouth every 6 (six) hours as needed. Pain     .  citalopram (CELEXA) 40 MG tablet  Take 0.5-1 tablets (20-40 mg total) by mouth 2 (two) times daily. Takes 1 tab qam and 1/2 tab qhs- Weaning off, per mental health  45 tablet  1   .   clonazePAM (KLONOPIN) 1 MG tablet  daily.     .  ferrous sulfate 325 (65 FE) MG tablet  Take 1 tablet (325 mg total) by mouth daily with breakfast.  90 tablet  3   .  lamoTRIgine (LAMICTAL) 200 MG tablet  TAKE 1 TABLET BY MOUTH TWICE DAILY  60 tablet  2   .  meloxicam (MOBIC) 15 MG tablet  TAKE 1 TABLET BY MOUTH EVERY DAY  30 tablet  5   .  QUEtiapine (SEROQUEL) 200 MG tablet  Take 1 tablet (200 mg total) by mouth 2 (two) times daily.  60 tablet  5   .  risperiDONE (RISPERDAL) 0.25 MG tablet  Take by mouth 2 (two) times daily. Unsure of dose. Given by mental health.     .  traMADol (ULTRAM) 50 MG tablet  TAKE 1 TABLET BY MOUTH EVERY 8 HOURS AS NEEDED  30 tablet  2   .  zolpidem (AMBIEN) 10 MG tablet  daily.      No current facility-administered medications for this visit.    Review of Systems  Review of Systems  Constitutional: Positive for unexpected weight change. Negative for fever and chills.   Respiratory: Negative for cough and wheezing.  Cardiovascular: Negative for chest pain, palpitations and leg swelling.  Gastrointestinal: Negative for nausea, vomiting, abdominal pain, diarrhea, constipation, blood in stool, abdominal distention and anal bleeding.    Physical Exam  Physical Exam  Vitals reviewed.  Constitutional: She appears well-developed and well-nourished.   Cardiovascular: Normal rate, regular rhythm and normal heart sounds.  All incisions well healed bilateral breasts and bilateral axillae   Assessment  Breast cancer   Plan  Port placement, risks/benefits discussed

## 2014-06-18 NOTE — Transfer of Care (Signed)
Immediate Anesthesia Transfer of Care Note  Patient: Kimberly [name]  Procedure(s) Performed: Procedure(s): INSERTION PORT-A-CATH (Right)  Patient Location: PACU  Anesthesia Type:General  Level of Consciousness: sedated  Airway & Oxygen Therapy: Patient Spontanous Breathing and Patient connected to face mask oxygen  Post-op Assessment: Report given to PACU RN and Post -op Vital signs reviewed and stable  Post vital signs: Reviewed and stable  Complications: No apparent anesthesia complications

## 2014-06-18 NOTE — H&P (View-Only) (Signed)
Subjective:     Patient ID: Kimberly [name], female   DOB: Aug 18, 1971, 43 y.o.   MRN: [id number]  HPI 57 yof s/p excision of accessory right axillary breast tissue, excision of right breast abnormality that is papilloma as well as left breast lumpectomy with snbx.  She has done well except for mostly right sided pain.  Her path returns as 1.5 cm margin negative IDC, grade II, er pos at 83, pr pos at 66, Ki 14%, her 2 negative, 1/2 nodes positive.  Oncotype is 22.  Review of Systems     Objective:   Physical Exam Healing right axillary and right breast incisions without infection Healing left breast and axillary incisions without infection    Assessment:     Stage II left breast cancer     Plan:     She can do what she wants.  I gave her info on [facility] pt class and encouraged her to go.  She has intermediate oncotype but positive node. I suspect she will need chemo but will wait until she sees Dr Jana Hakim later today.

## 2014-06-18 NOTE — Op Note (Signed)
Preoperative diagnosis: stage I left breast cancer with intermediate oncotype Postoperative diagnosis: same as above  Procedure: Right subclavian power port insertion  Surgeon: Dr. Serita Grammes  Anesthesia: Gen.  Estimated blood loss: Minimal  Specimens: None  Drains: None  Complications: None  Sponge and needle count was correct at completion  Disposition to recovery stable   Indications: This is a 42 yof s/p lumpectomy/sn for stage I left breast cancer with intermediate oncotype. She has discussed with oncology options and has elected to undergo chemotherapy. She now presents for port placement to begin chemotherapy in the next couple weeks. We discussed the risks and benefits prior to beginning.   Procedure: After informed consent was obtained the patient was taken to the operating room. She was given cefazolin. Sequential compression devices on her legs. She was placed under general anesthesia with an LMA. Her chest was then prepped and draped in the standard sterile surgical fashion and she was positioned and appropriately padded. A surgical timeout was then performed.   I infiltrated Marcaine throughout her right chest as well as on her clavicle.I accessed her subclavian vein on the first pass. This was confirmed by fluoroscopy. I then made a pocket below this overlying the pectoralis fascia. I then tunneled the line between the 2 sites. I dilated the tract and inserted the peel-away sheath under fluoroscopy. I then removed the wire assembly. I then placed the line through this and removed the peel-away sheath. I pulled the line back to be in the distal cava. I then connected this to the port. I then sutured this into position with 2-0 Prolene in 2 places. The port flushed easily and aspirated blood. I packed this with heparin. Final fluoroscopy showed that the line was not kinked and the line was in good position. I then closed this with 3-0 Vicryl, 4-0 Monocryl, and Dermabond. She tolerated  this well was extubated and transferred to recovery stable.

## 2014-06-21 ENCOUNTER — Encounter (HOSPITAL_BASED_OUTPATIENT_CLINIC_OR_DEPARTMENT_OTHER): Payer: Self-pay | Admitting: General Surgery

## 2014-07-02 ENCOUNTER — Other Ambulatory Visit: Payer: Self-pay | Admitting: *Deleted

## 2014-07-02 ENCOUNTER — Other Ambulatory Visit (HOSPITAL_BASED_OUTPATIENT_CLINIC_OR_DEPARTMENT_OTHER): Payer: Medicare Other

## 2014-07-02 ENCOUNTER — Ambulatory Visit (HOSPITAL_BASED_OUTPATIENT_CLINIC_OR_DEPARTMENT_OTHER): Payer: Medicare Other | Admitting: Oncology

## 2014-07-02 VITALS — BP 125/62 | HR 92 | Temp 97.7°F | Resp 20 | Ht 69.0 in | Wt 266.5 lb

## 2014-07-02 DIAGNOSIS — C50419 Malignant neoplasm of upper-outer quadrant of unspecified female breast: Secondary | ICD-10-CM

## 2014-07-02 DIAGNOSIS — C50412 Malignant neoplasm of upper-outer quadrant of left female breast: Secondary | ICD-10-CM

## 2014-07-02 DIAGNOSIS — F17200 Nicotine dependence, unspecified, uncomplicated: Secondary | ICD-10-CM

## 2014-07-02 DIAGNOSIS — F3160 Bipolar disorder, current episode mixed, unspecified: Secondary | ICD-10-CM

## 2014-07-02 DIAGNOSIS — C50919 Malignant neoplasm of unspecified site of unspecified female breast: Secondary | ICD-10-CM

## 2014-07-02 DIAGNOSIS — Z170 Estrogen receptor positive status [ER+]: Secondary | ICD-10-CM

## 2014-07-02 LAB — COMPREHENSIVE METABOLIC PANEL (CC13)
ALT: 17 U/L (ref 0–55)
AST: 19 U/L (ref 5–34)
Albumin: 3.7 g/dL (ref 3.5–5.0)
Alkaline Phosphatase: 81 U/L (ref 40–150)
Anion Gap: 10 mEq/L (ref 3–11)
BUN: 10.6 mg/dL (ref 7.0–26.0)
CO2: 24 mEq/L (ref 22–29)
Calcium: 9.2 mg/dL (ref 8.4–10.4)
Chloride: 106 mEq/L (ref 98–109)
Creatinine: 1 mg/dL (ref 0.6–1.1)
Glucose: 105 mg/dl (ref 70–140)
Potassium: 3.6 mEq/L (ref 3.5–5.1)
Sodium: 140 mEq/L (ref 136–145)
Total Bilirubin: 0.28 mg/dL (ref 0.20–1.20)
Total Protein: 7.3 g/dL (ref 6.4–8.3)

## 2014-07-02 LAB — CBC WITH DIFFERENTIAL/PLATELET
BASO%: 1 % (ref 0.0–2.0)
Basophils Absolute: 0.1 10*3/uL (ref 0.0–0.1)
EOS%: 2.1 % (ref 0.0–7.0)
Eosinophils Absolute: 0.1 10*3/uL (ref 0.0–0.5)
HCT: 40.8 % (ref 34.8–46.6)
HGB: 13.4 g/dL (ref 11.6–15.9)
LYMPH%: 39.4 % (ref 14.0–49.7)
MCH: 25.6 pg (ref 25.1–34.0)
MCHC: 32.8 g/dL (ref 31.5–36.0)
MCV: 78 fL — ABNORMAL LOW (ref 79.5–101.0)
MONO#: 1 10*3/uL — ABNORMAL HIGH (ref 0.1–0.9)
MONO%: 14.3 % — ABNORMAL HIGH (ref 0.0–14.0)
NEUT#: 2.9 10*3/uL (ref 1.5–6.5)
NEUT%: 43.2 % (ref 38.4–76.8)
Platelets: 218 10*3/uL (ref 145–400)
RBC: 5.23 10*6/uL (ref 3.70–5.45)
RDW: 14.6 % — ABNORMAL HIGH (ref 11.2–14.5)
WBC: 6.8 10*3/uL (ref 3.9–10.3)
lymph#: 2.7 10*3/uL (ref 0.9–3.3)

## 2014-07-02 MED ORDER — DEXAMETHASONE 4 MG PO TABS: 8.0000 mg | ORAL_TABLET | Freq: Two times a day (BID) | ORAL | Status: DC

## 2014-07-02 MED ORDER — LORAZEPAM 0.5 MG PO TABS: 0.5000 mg | ORAL_TABLET | Freq: Every evening | ORAL | Status: DC | PRN

## 2014-07-02 MED ORDER — LIDOCAINE-PRILOCAINE 2.5-2.5 % EX CREA: 1.0000 "application " | TOPICAL_CREAM | CUTANEOUS | Status: DC | PRN

## 2014-07-02 MED ORDER — ONDANSETRON HCL 8 MG PO TABS: 8.0000 mg | ORAL_TABLET | Freq: Two times a day (BID) | ORAL | Status: DC

## 2014-07-02 MED ORDER — PROCHLORPERAZINE MALEATE 10 MG PO TABS: 10.0000 mg | ORAL_TABLET | Freq: Four times a day (QID) | ORAL | Status: DC | PRN

## 2014-07-02 NOTE — Progress Notes (Signed)
[place]  [phone] [phone]) [phone]     ID: Kimberly [name] DOB: 1971-08-13  MR#: [id number]  OYD#:[id number]  Patient Care Team: [name], DO as PCP - General [name], MD as Consulting Physician (General Surgery) [name], MD as Consulting Physician (Oncology) [name], MD as Attending Physician (Radiation Oncology)  CHIEF COMPLAINT: Estrogen receptor positive breast cancer  CURRENT TREATMENT: Adjuvant chemotherapy   BREAST CANCER HISTORY: From the original intake note:  The patient had bilateral screening mammography with tomography 03/19/2014. This was the patient's first ever mammography. Showed a possible mass in the left breast. Left diagnostic mammography and ultrasonography 04/01/2014 showed an irregular mass in the upper outer quadrant of the left breast with a possible satellite 1 cm anterior to it. On physical exam there was a firm palpable mass at the 1:00 position of the left breast 8 cm from the nipple. There was no palpable left axillary adenopathy. Ultrasound showed an irregular hypoechoic mass in the area in question measuring 1.4 cm. There was a 4 mm nodule located anterior to this. Ultrasound of the left axilla was benign.  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 The second mass was negative for malignancy. This was felt to be concordant.  On 04/08/2014 the patient underwent bilateral breast MRI. This showed a 9 mm enhancing mass in the subareolar area of the right breast in in the left breast, the previously noted mass measuring 1.3 cm. There were no morphologically abnormal lymph nodes  The right  breast finding was followed up on 04/19/2014 with ultrasound which showed an intraductal soft tissue mass in the inferior subareolar worsening of the right breast measuring 8 mm. This was biopsied 04/19/2014 and showed ([id number]) ectatic duct, with no evidence of malignancy. Surgical excision was recommended.  Accordingly on 05/03/2014 the patient underwent right lumpectomy, showing an intraductal papilloma, with known malignancy identified. Left lumpectomy on the same day showed an invasive ductal carcinoma measuring 1.5 cm, with one of the 2 sentinel lymph nodes positive for carcinoma. There was no extracapsular extension. Margins were clear and ample. HER-2 was repeated and was again negative.  The patient's case was discussed in the multidisciplinary breast cancer conference 05/12/2014. An Oncotype had been previously requested and this showed a recurrent score of 22, in the intermediate range. The patient qualifies for the S1007 study and this will be discussed with her. Otherwise the standard recommendation would be chemotherapy followed by radiation followed by anti-estrogens.  The patient's subsequent history is as detailed below.  INTERVAL HISTORY: Kimberly [name] returns today for followup of her breast cancer accompanied by her husband Kimberly [name]. Since her last visit here she had her port placed and she came to "chemotherapy school". She also met with her psychiatrist. She is very disappointed that she was not able to get the medication she requested.   REVIEW OF SYSTEMS: She tells me she has been having fevers, but cannot tell me 8 temperature. She has had chills and night sweats. She is fatigued and this affects her activities. She sleeps very poorly. She has pain in her underarm and breast and describes it as a stabbing and throbbing. She also has back pain joint  pain and difficulty walking. She has a runny nose and sinus problems. She has a sore throat. She has a dry cough. Sometimes her chest  hurts. This is not angina but chest wall discomfort. She feels weak numb forgetful anxious and depressed. A detailed review of systems today was otherwise stable  PAST MEDICAL HISTORY: Past Medical History  Diagnosis Date  . Anemia   . Arthritis   . Schizophrenia   . Bipolar 1 disorder   . Blood transfusion without reported diagnosis 2007    post bleeding childbirth  . Cancer     eval  lt breast cancer  . Heart murmur     as a child-not adult-no cardiac work up  . Diabetes mellitus without complication     gestational    PAST SURGICAL HISTORY: Past Surgical History  Procedure Laterality Date  . Tonsillectomy and adenoidectomy    . Dilation and curettage of uterus      after childbirth  . Multiple tooth extractions      only 5 left  . Breast biopsy Left 04/01/14  . Breast biopsy Right 04/19/14  . Breast lumpectomy with axillary lymph node dissection Bilateral 05/03/14  . Portacath placement Right 06/18/2014    Procedure: INSERTION PORT-A-CATH;  Surgeon: [name], MD;  Location: [name];  Service: General;  Laterality: Right;    FAMILY HISTORY No family history on file. The patient's father is currently living at age 30. The patient's mother was murdered at age 40 by the patient's mother's sister. The patient has 2 brothers, one sister. The patient's father hase prostate cancer, diagnosed at age 64. There is no history of breast or ovarian cancer in the family.  GYNECOLOGIC HISTORY:  No LMP recorded. Menarche age 40, first live birth age 72, the patient is [place] P5. The patient has not had a period in the last 2 months but tells me she has tested for pregnancy x2 and she is not pregnant.  SOCIAL HISTORY:   the patient tells me she is disabled secondary to her nervous breakdown. She is currently in her third marriage. Her husband, [name], is retired from [employer]. He is disabled secondary to seizures. The patient's children are Kimberly [name] and Kimberly [name], age 76 and 24, Kimberly [name] age 22,  And Kimberly [name] and Kimberly [name] age 70 and 75. In addition a nephew, Kimberly [name], 16, lives with her family.    ADVANCED DIRECTIVES:  not in place    HEALTH MAINTENANCE: History  Substance Use Topics  . Smoking status: Current Every Day Smoker -- 1.00 packs/day for 32 years    Types: Cigarettes  . Smokeless tobacco: Never Used  . Alcohol Use: No     Colonoscopy:  PAP:  Bone density:  Lipid panel:  No Known Allergies  Current Outpatient Prescriptions  Medication Sig Dispense Refill  . acetaminophen (TYLENOL) 500 MG tablet Take 1,000 mg by mouth every 6 (six) hours as needed. Pain      . citalopram (CELEXA) 40 MG tablet Take 0.5-1 tablets (20-40 mg total) by mouth 2 (two) times daily. Takes 1 tab qam and 1/2 tab qhs- Weaning off, per mental health  45 tablet  1  . clonazePAM (KLONOPIN) 1 MG tablet daily.      . ferrous sulfate 325 (65 FE) MG tablet Take 1 tablet (325 mg total) by mouth daily with breakfast.  90 tablet  3  . lamoTRIgine (LAMICTAL) 200 MG tablet TAKE 1 TABLET BY MOUTH TWICE DAILY  60 tablet  2  . oxyCODONE-acetaminophen (PERCOCET) 10-325 MG per tablet Take 1 tablet by mouth every 6 (six) hours as needed for pain.  20 tablet  0  . oxyCODONE-acetaminophen (PERCOCET/ROXICET) 5-325 MG per tablet Take 1-2 tablets by mouth every 4 (four) hours as needed for severe pain.  30 tablet  0  . QUEtiapine (SEROQUEL) 200 MG tablet Take 1 tablet (200 mg total) by mouth 2 (two) times daily.  60 tablet  5  . risperiDONE (RISPERDAL) 0.25 MG tablet Take by mouth 2 (two) times daily. Unsure of dose. Given by mental health.      . traMADol (ULTRAM) 50 MG tablet TAKE 1 TABLET BY MOUTH EVERY 8 HOURS AS NEEDED  30 tablet  2  . zolpidem (AMBIEN) 10 MG tablet daily.       No current facility-administered medications for this visit.    OBJECTIVE: middle-aged [place] woman who appears stated age 33 Vitals:   07/02/14 1121  BP: 125/62  Pulse: 92  Temp:  97.7 F (36.5 C)  Resp: 20     Body mass index is 39.34 kg/(m^2).    ECOG FS:1 - Symptomatic but completely ambulatory  Sclerae unicteric, pupils equal and reactive Oropharynx clear and moist No cervical or supraclavicular adenopathy Lungs no rales or rhonchi Heart regular rate and rhythm Abd soft, nontender, positive bowel sounds MSK no focal spinal tenderness, no upper extremity lymphedema Neuro: nonfocal, well oriented, appropriate affect Breasts: Deferred    LAB RESULTS:  CMP     Component Value Date/Time   NA 137 05/28/2014 1417   NA 140 04/30/2014 0915   K 4.5 05/28/2014 1417   K 4.4 04/30/2014 0915   CL 101 04/30/2014 0915   CO2 21* 05/28/2014 1417   CO2 25 04/30/2014 0915   GLUCOSE 103 05/28/2014 1417   GLUCOSE 79 04/30/2014 0915   BUN 15.8 05/28/2014 1417   BUN 14 04/30/2014 0915   CREATININE 1.0 05/28/2014 1417   CREATININE 1.02 04/30/2014 0915   CREATININE 0.94 02/25/2014 0926   CALCIUM 9.6 05/28/2014 1417   CALCIUM 9.3 04/30/2014 0915   PROT 7.7 05/28/2014 1417   PROT 7.3 04/30/2014 0915   ALBUMIN 4.1 05/28/2014 1417   ALBUMIN 3.8 04/30/2014 0915   AST 10 05/28/2014 1417   AST 15 04/30/2014 0915   ALT <6 05/28/2014 1417   ALT 10 04/30/2014 0915   ALKPHOS 76 05/28/2014 1417   ALKPHOS 77 04/30/2014 0915   BILITOT 0.37 05/28/2014 1417   BILITOT 0.2* 04/30/2014 0915   GFRNONAA 67* 04/30/2014 0915   GFRAA 77* 04/30/2014 0915    I No results found for this basename: SPEP,  UPEP,   kappa and lambda light chains    Lab Results  Component Value Date   WBC 6.8 07/02/2014   NEUTROABS 2.9 07/02/2014   HGB 13.4 07/02/2014   HCT 40.8 07/02/2014   MCV 78.0* 07/02/2014   PLT 218 07/02/2014      Chemistry      Component Value Date/Time   NA 137 05/28/2014 1417   NA 140 04/30/2014 0915   K 4.5 05/28/2014 1417   K 4.4 04/30/2014 0915   CL 101 04/30/2014 0915   CO2 21* 05/28/2014 1417   CO2 25 04/30/2014 0915   BUN 15.8 05/28/2014 1417   BUN 14 04/30/2014 0915   CREATININE 1.0 05/28/2014 1417   CREATININE  1.02 04/30/2014 0915   CREATININE 0.94 02/25/2014 0926      Component Value Date/Time  CALCIUM 9.6 05/28/2014 1417   CALCIUM 9.3 04/30/2014 0915   ALKPHOS 76 05/28/2014 1417   ALKPHOS 77 04/30/2014 0915   AST 10 05/28/2014 1417   AST 15 04/30/2014 0915   ALT <6 05/28/2014 1417   ALT 10 04/30/2014 0915   BILITOT 0.37 05/28/2014 1417   BILITOT 0.2* 04/30/2014 0915       Lab Results  Component Value Date   LABCA2 28 04/30/2014    No components found with this basename: [id number]    No results found for this basename: INR,  in the last 168 hours  Urinalysis    Component Value Date/Time   COLORURINE YELLOW 11/22/2010 1330   APPEARANCEUR CLEAR 11/22/2010 1330   LABSPEC 1.006 11/22/2010 1330   PHURINE 6.0 11/22/2010 1330   HGBUR NEGATIVE 11/22/2010 1330   BILIRUBINUR NEGATIVE 11/22/2010 1330   KETONESUR NEGATIVE 11/22/2010 1330   PROTEINUR NEGATIVE 11/22/2010 1330   UROBILINOGEN 1.0 11/22/2010 1330   NITRITE NEGATIVE 11/22/2010 1330   LEUKOCYTESUR NEGATIVE MICROSCOPIC NOT DONE ON URINES WITH NEGATIVE PROTEIN, BLOOD, LEUKOCYTES, NITRITE, OR GLUCOSE <1000 mg/dL. 11/22/2010 1330    STUDIES: Dg Chest Port 1 View  06/18/2014   CLINICAL DATA:  Status post port placement.  EXAM: PORTABLE CHEST - 1 VIEW  COMPARISON:  11/23/2010.  FINDINGS: Right anterior chest wall Port-A-Cath has been placed. The tip lies in the lower superior vena cava. There is no pneumothorax.  There is hazy airspace opacity in the central upper lobes and medial lung bases. This suggests mild pulmonary edema. No pleural effusion is evident. Cardiac silhouette is normal in size.  IMPRESSION: No pneumothorax following Port-A-Cath placement. Catheter tip lies in the lower superior vena cava.  Central lung airspace opacity suggests mild pulmonary edema. These results were called by telephone at the time of interpretation on 06/18/2014 at 10:05 am to the patient's nurse, who verbally acknowledged these results.   Electronically Signed   By: Lajean Manes M.D.    On: 06/18/2014 10:05   Dg Fluoro Guide Cv Line-no Report  06/18/2014   CLINICAL DATA: Port Insertion   FLOURO GUIDE CV LINE  Fluoroscopy was utilized by the requesting physician.  No radiographic  interpretation.    ASSESSMENT: 43 y.o. BRCA negative [place] woman status post right lumpectomy and sentinel lymph node sampling 05/03/2014 for a pT1c pN0, stage IA invasive ductal carcinoma, grade 2, estrogen receptor 83% positive, progesterone receptor 66% positive, with an MIB-1 of 14% and no HER-2 amplification.  (1) Oncotype score of 22 predicts a risk of outside the breast recurrence within 10 years of 13% if the patient's only systemic therapy is tamoxifen for 5 years.  (2) adjuvant chemotherapy with cyclophosphamide and docetaxel to start 07/06/2014  (3) adjuvant radiation to follow chemotherapy  (4) adjuvant antiestrogen therapy for 5-10 years to follow radiation  PLAN: I spent approximately 45 minutes today with the patient and her husband going over her supportive medications and how to take them. I put in all her prescriptions and I gave her written instructions on how to take her medications. Specifically she will take dexamethasone 8 mg twice a day on the day before chemotherapy. On the day of chemotherapy she will place the EMLA cream over the port site and cover it with plastic wrap. She will start her dexamethasone and ondansetron that evening and continue for the next 72 hours. She will use the prochlorperazine as needed. She will use the Ativan for insomnia and no more than 3 days in a  row.  I also went ahead and entered all her treatment orders and visit appointments. She will let us know if she has any problems with transportation or other issues.  I again strongly counseled the patient regarding smoking, as low oxygen levels may interfere with both the planned chemotherapy and radiation treatments.   She has a good understanding of the possible toxicities, side effects and  complications of Cytoxan and Taxotere in knows the importance of keeping her hands washed, and calling us for any temperature greater than 100.  The patient has a good understanding of the overall plan. She agrees with it. She knows the goal of treatment in her case is cure. She will call with any problems that may develop before her next visit here.  [name], MD   07/02/2014 11:41 AM

## 2014-07-02 NOTE — Addendum Note (Signed)
Addended by: Laureen Abrahams on: 07/02/2014 06:15 PM   Modules accepted: Medications

## 2014-07-05 ENCOUNTER — Telehealth: Payer: Self-pay | Admitting: Oncology

## 2014-07-05 ENCOUNTER — Telehealth: Payer: Self-pay | Admitting: *Deleted

## 2014-07-05 ENCOUNTER — Other Ambulatory Visit: Payer: Self-pay | Admitting: *Deleted

## 2014-07-05 DIAGNOSIS — C50412 Malignant neoplasm of upper-outer quadrant of left female breast: Secondary | ICD-10-CM

## 2014-07-05 NOTE — Telephone Encounter (Signed)
per pof to sch pt appt-sent email to MW to sch trmt-pt will get updated sch 9/15 appt

## 2014-07-05 NOTE — Telephone Encounter (Signed)
Per pharmacy I have scheduled in jection  

## 2014-07-05 NOTE — Telephone Encounter (Signed)
Per staff message and POF I have scheduled appts. Advised scheduler of appts. JMW  

## 2014-07-06 ENCOUNTER — Other Ambulatory Visit (HOSPITAL_BASED_OUTPATIENT_CLINIC_OR_DEPARTMENT_OTHER): Payer: Medicare Other

## 2014-07-06 ENCOUNTER — Ambulatory Visit: Payer: Medicare Other

## 2014-07-06 ENCOUNTER — Ambulatory Visit (HOSPITAL_BASED_OUTPATIENT_CLINIC_OR_DEPARTMENT_OTHER): Payer: Medicare Other

## 2014-07-06 VITALS — BP 122/76 | HR 93 | Temp 98.1°F | Resp 17

## 2014-07-06 DIAGNOSIS — C50412 Malignant neoplasm of upper-outer quadrant of left female breast: Secondary | ICD-10-CM

## 2014-07-06 DIAGNOSIS — C50419 Malignant neoplasm of upper-outer quadrant of unspecified female breast: Secondary | ICD-10-CM

## 2014-07-06 DIAGNOSIS — C50919 Malignant neoplasm of unspecified site of unspecified female breast: Secondary | ICD-10-CM

## 2014-07-06 DIAGNOSIS — Z5111 Encounter for antineoplastic chemotherapy: Secondary | ICD-10-CM

## 2014-07-06 LAB — CBC WITH DIFFERENTIAL/PLATELET
BASO%: 0.1 % (ref 0.0–2.0)
Basophils Absolute: 0 10*3/uL (ref 0.0–0.1)
EOS%: 0 % (ref 0.0–7.0)
Eosinophils Absolute: 0 10*3/uL (ref 0.0–0.5)
HCT: 41.9 % (ref 34.8–46.6)
HGB: 14.1 g/dL (ref 11.6–15.9)
LYMPH%: 15.6 % (ref 14.0–49.7)
MCH: 26.2 pg (ref 25.1–34.0)
MCHC: 33.7 g/dL (ref 31.5–36.0)
MCV: 77.9 fL — ABNORMAL LOW (ref 79.5–101.0)
MONO#: 0.8 10*3/uL (ref 0.1–0.9)
MONO%: 6.6 % (ref 0.0–14.0)
NEUT#: 9.1 10*3/uL — ABNORMAL HIGH (ref 1.5–6.5)
NEUT%: 77.7 % — ABNORMAL HIGH (ref 38.4–76.8)
Platelets: 241 10*3/uL (ref 145–400)
RBC: 5.38 10*6/uL (ref 3.70–5.45)
RDW: 14.5 % (ref 11.2–14.5)
WBC: 11.8 10*3/uL — ABNORMAL HIGH (ref 3.9–10.3)
lymph#: 1.8 10*3/uL (ref 0.9–3.3)
nRBC: 0 % (ref 0–0)

## 2014-07-06 MED ORDER — DEXAMETHASONE SODIUM PHOSPHATE 20 MG/5ML IJ SOLN
20.0000 mg | Freq: Once | INTRAMUSCULAR | Status: AC
  Administered 2014-07-06: 20 mg via INTRAVENOUS

## 2014-07-06 MED ORDER — ONDANSETRON 16 MG/50ML IVPB (CHCC)
16.0000 mg | Freq: Once | INTRAVENOUS | Status: AC
  Administered 2014-07-06: 16 mg via INTRAVENOUS

## 2014-07-06 MED ORDER — SODIUM CHLORIDE 0.9 % IJ SOLN
10.0000 mL | INTRAMUSCULAR | Status: DC | PRN
  Administered 2014-07-06: 10 mL
  Filled 2014-07-06: qty 10

## 2014-07-06 MED ORDER — DEXTROSE 5 % IV SOLN
75.0000 mg/m2 | Freq: Once | INTRAVENOUS | Status: AC
  Administered 2014-07-06: 180 mg via INTRAVENOUS
  Filled 2014-07-06: qty 18

## 2014-07-06 MED ORDER — SODIUM CHLORIDE 0.9 % IV SOLN
Freq: Once | INTRAVENOUS | Status: AC
  Administered 2014-07-06: 09:00:00 via INTRAVENOUS

## 2014-07-06 MED ORDER — HEPARIN SOD (PORK) LOCK FLUSH 100 UNIT/ML IV SOLN
500.0000 [IU] | Freq: Once | INTRAVENOUS | Status: AC | PRN
  Administered 2014-07-06: 500 [IU]
  Filled 2014-07-06: qty 5

## 2014-07-06 MED ORDER — ONDANSETRON 16 MG/50ML IVPB (CHCC)
INTRAVENOUS | Status: AC
  Filled 2014-07-06: qty 16

## 2014-07-06 MED ORDER — SODIUM CHLORIDE 0.9 % IV SOLN
600.0000 mg/m2 | Freq: Once | INTRAVENOUS | Status: AC
  Administered 2014-07-06: 1460 mg via INTRAVENOUS
  Filled 2014-07-06: qty 73

## 2014-07-06 MED ORDER — DEXAMETHASONE SODIUM PHOSPHATE 20 MG/5ML IJ SOLN
INTRAMUSCULAR | Status: AC
  Filled 2014-07-06: qty 5

## 2014-07-06 NOTE — Progress Notes (Signed)
Patient tolerated 1st taxotere/cytoxan well. Patient did have a slight runny nose at the end of cytoxan, explained to patient this is a common side effect and that next time cytoxan may have to run over 45 mins. She voices understanding. AVS, meds to take at home, and appts reviewed, voiced understanding of teaching.

## 2014-07-06 NOTE — Patient Instructions (Addendum)
[facility] Discharge Instructions for Patients Receiving Chemotherapy  Today you received the following chemotherapy agents: Taxotere, Cytoxan  To help prevent nausea and vomiting after your treatment, we encourage you to take your nausea medication as prescribed by your physician: Zofran 8 mg twice daily or Compazine 10 mg every 6 hrs as needed. Per Dr. Jana Hakim: start dexamethasone and ondansetron as prescribed by your physician. Use the prochlorperazine as needed. Use the Ativan for insomnia and no more than 3 days in a row.    If you develop nausea and vomiting that is not controlled by your nausea medication, call the clinic.   BELOW ARE SYMPTOMS THAT SHOULD BE REPORTED IMMEDIATELY:  *FEVER GREATER THAN 100.5 F  *CHILLS WITH OR WITHOUT FEVER  NAUSEA AND VOMITING THAT IS NOT CONTROLLED WITH YOUR NAUSEA MEDICATION  *UNUSUAL SHORTNESS OF BREATH  *UNUSUAL BRUISING OR BLEEDING  TENDERNESS IN MOUTH AND THROAT WITH OR WITHOUT PRESENCE OF ULCERS  *URINARY PROBLEMS  *BOWEL PROBLEMS  UNUSUAL RASH Items with * indicate a potential emergency and should be followed up as soon as possible.  Feel free to call the clinic you have any questions or concerns. The clinic phone number is [phone].   Docetaxel injection (Taxotere) What is this medicine? DOCETAXEL (doe se TAX el) is a chemotherapy drug. It targets fast dividing cells, like cancer cells, and causes these cells to die. This medicine is used to treat many types of cancers like breast cancer, certain stomach cancers, head and neck cancer, lung cancer, and prostate cancer. This medicine may be used for other purposes; ask your health care provider or pharmacist if you have questions. COMMON BRAND NAME(S): Docefrez, Taxotere What should I tell my health care provider before I take this medicine? They need to know if you have any of these conditions: -infection (especially a virus infection such as chickenpox,  cold sores, or herpes) -liver disease -low blood counts, like low white cell, platelet, or red cell counts -an unusual or allergic reaction to docetaxel, polysorbate 80, other chemotherapy agents, other medicines, foods, dyes, or preservatives -pregnant or trying to get pregnant -breast-feeding How should I use this medicine? This drug is given as an infusion into a vein. It is administered in a hospital or clinic by a specially trained health care professional. Talk to your pediatrician regarding the use of this medicine in children. Special care may be needed. Overdosage: If you think you have taken too much of this medicine contact a poison control center or emergency room at once. NOTE: This medicine is only for you. Do not share this medicine with others. What if I miss a dose? It is important not to miss your dose. Call your doctor or health care professional if you are unable to keep an appointment. What may interact with this medicine? -cyclosporine -erythromycin -ketoconazole -medicines to increase blood counts like filgrastim, pegfilgrastim, sargramostim -vaccines Talk to your doctor or health care professional before taking any of these medicines: -acetaminophen -aspirin -ibuprofen -ketoprofen -naproxen This list may not describe all possible interactions. Give your health care provider a list of all the medicines, herbs, non-prescription drugs, or dietary supplements you use. Also tell them if you smoke, drink alcohol, or use illegal drugs. Some items may interact with your medicine. What should I watch for while using this medicine? Your condition will be monitored carefully while you are receiving this medicine. You will need important blood work done while you are taking this medicine. This drug may  make you feel generally unwell. This is not uncommon, as chemotherapy can affect healthy cells as well as cancer cells. Report any side effects. Continue your course of  treatment even though you feel ill unless your doctor tells you to stop. In some cases, you may be given additional medicines to help with side effects. Follow all directions for their use. Call your doctor or health care professional for advice if you get a fever, chills or sore throat, or other symptoms of a cold or flu. Do not treat yourself. This drug decreases your body's ability to fight infections. Try to avoid being around people who are sick. This medicine may increase your risk to bruise or bleed. Call your doctor or health care professional if you notice any unusual bleeding. Be careful brushing and flossing your teeth or using a toothpick because you may get an infection or bleed more easily. If you have any dental work done, tell your dentist you are receiving this medicine. Avoid taking products that contain aspirin, acetaminophen, ibuprofen, naproxen, or ketoprofen unless instructed by your doctor. These medicines may hide a fever. This medicine contains an alcohol in the product. You may get drowsy or dizzy. Do not drive, use machinery, or do anything that needs mental alertness until you know how this medicine affects you. Do not stand or sit up quickly, especially if you are an older patient. This reduces the risk of dizzy or fainting spells. Avoid alcoholic drinks Do not become pregnant while taking this medicine. Women should inform their doctor if they wish to become pregnant or think they might be pregnant. There is a potential for serious side effects to an unborn child. Talk to your health care professional or pharmacist for more information. Do not breast-feed an infant while taking this medicine. What side effects may I notice from receiving this medicine? Side effects that you should report to your doctor or health care professional as soon as possible: -allergic reactions like skin rash, itching or hives, swelling of the face, lips, or tongue -low blood counts - This drug may  decrease the number of white blood cells, red blood cells and platelets. You may be at increased risk for infections and bleeding. -signs of infection - fever or chills, cough, sore throat, pain or difficulty passing urine -signs of decreased platelets or bleeding - bruising, pinpoint red spots on the skin, black, tarry stools, nosebleeds -signs of decreased red blood cells - unusually weak or tired, fainting spells, lightheadedness -breathing problems -fast or irregular heartbeat -low blood pressure -mouth sores -nausea and vomiting -pain, swelling, redness or irritation at the injection site -pain, tingling, numbness in the hands or feet -swelling of the ankle, feet, hands -weight gain Side effects that usually do not require medical attention (report to your prescriber or health care professional if they continue or are bothersome): -bone pain -complete hair loss including hair on your head, underarms, pubic hair, eyebrows, and eyelashes -diarrhea -excessive tearing -changes in the color of fingernails -loosening of the fingernails -nausea -muscle pain -red flush to skin -sweating -weak or tired This list may not describe all possible side effects. Call your doctor for medical advice about side effects. You may report side effects to FDA at 1-800-FDA-1088. Where should I keep my medicine? This drug is given in a hospital or clinic and will not be stored at home. NOTE: This sheet is a summary. It may not cover all possible information. If you have questions about this medicine, talk to  your doctor, pharmacist, or health care provider.  2015, [employer]. (2013-09-03 22:21:02)  Cyclophosphamide injection (Cytoxan) What is this medicine? CYCLOPHOSPHAMIDE (sye kloe FOSS fa mide) is a chemotherapy drug. It slows the growth of cancer cells. This medicine is used to treat many types of cancer like lymphoma, myeloma, leukemia, breast cancer, and ovarian cancer, to name a  few. This medicine may be used for other purposes; ask your health care provider or pharmacist if you have questions. COMMON BRAND NAME(S): Cytoxan, Neosar What should I tell my health care provider before I take this medicine? They need to know if you have any of these conditions: -blood disorders -history of other chemotherapy -infection -kidney disease -liver disease -recent or ongoing radiation therapy -tumors in the bone marrow -an unusual or allergic reaction to cyclophosphamide, other chemotherapy, other medicines, foods, dyes, or preservatives -pregnant or trying to get pregnant -breast-feeding How should I use this medicine? This drug is usually given as an injection into a vein or muscle or by infusion into a vein. It is administered in a hospital or clinic by a specially trained health care professional. Talk to your pediatrician regarding the use of this medicine in children. Special care may be needed. Overdosage: If you think you have taken too much of this medicine contact a poison control center or emergency room at once. NOTE: This medicine is only for you. Do not share this medicine with others. What if I miss a dose? It is important not to miss your dose. Call your doctor or health care professional if you are unable to keep an appointment. What may interact with this medicine? This medicine may interact with the following medications: -amiodarone -amphotericin B -azathioprine -certain antiviral medicines for HIV or AIDS such as protease inhibitors (e.g., indinavir, ritonavir) and zidovudine -certain blood pressure medications such as benazepril, captopril, enalapril, fosinopril, lisinopril, moexipril, monopril, perindopril, quinapril, ramipril, trandolapril -certain cancer medications such as anthracyclines (e.g., daunorubicin, doxorubicin), busulfan, cytarabine, paclitaxel, pentostatin, tamoxifen, trastuzumab -certain diuretics such as chlorothiazide,  chlorthalidone, hydrochlorothiazide, indapamide, metolazone -certain medicines that treat or prevent blood clots like warfarin -certain muscle relaxants such as succinylcholine -cyclosporine -etanercept -indomethacin -medicines to increase blood counts like filgrastim, pegfilgrastim, sargramostim -medicines used as general anesthesia -metronidazole -natalizumab This list may not describe all possible interactions. Give your health care provider a list of all the medicines, herbs, non-prescription drugs, or dietary supplements you use. Also tell them if you smoke, drink alcohol, or use illegal drugs. Some items may interact with your medicine. What should I watch for while using this medicine? Visit your doctor for checks on your progress. This drug may make you feel generally unwell. This is not uncommon, as chemotherapy can affect healthy cells as well as cancer cells. Report any side effects. Continue your course of treatment even though you feel ill unless your doctor tells you to stop. Drink water or other fluids as directed. Urinate often, even at night. In some cases, you may be given additional medicines to help with side effects. Follow all directions for their use. Call your doctor or health care professional for advice if you get a fever, chills or sore throat, or other symptoms of a cold or flu. Do not treat yourself. This drug decreases your body's ability to fight infections. Try to avoid being around people who are sick. This medicine may increase your risk to bruise or bleed. Call your doctor or health care professional if you notice any unusual bleeding. Be careful brushing and  flossing your teeth or using a toothpick because you may get an infection or bleed more easily. If you have any dental work done, tell your dentist you are receiving this medicine. You may get drowsy or dizzy. Do not drive, use machinery, or do anything that needs mental alertness until you know how this  medicine affects you. Do not become pregnant while taking this medicine or for 1 year after stopping it. Women should inform their doctor if they wish to become pregnant or think they might be pregnant. Men should not father a child while taking this medicine and for 4 months after stopping it. There is a potential for serious side effects to an unborn child. Talk to your health care professional or pharmacist for more information. Do not breast-feed an infant while taking this medicine. This medicine may interfere with the ability to have a child. This medicine has caused ovarian failure in some women. This medicine has caused reduced sperm counts in some men. You should talk with your doctor or health care professional if you are concerned about your fertility. If you are going to have surgery, tell your doctor or health care professional that you have taken this medicine. What side effects may I notice from receiving this medicine? Side effects that you should report to your doctor or health care professional as soon as possible: -allergic reactions like skin rash, itching or hives, swelling of the face, lips, or tongue -low blood counts - this medicine may decrease the number of white blood cells, red blood cells and platelets. You may be at increased risk for infections and bleeding. -signs of infection - fever or chills, cough, sore throat, pain or difficulty passing urine -signs of decreased platelets or bleeding - bruising, pinpoint red spots on the skin, black, tarry stools, blood in the urine -signs of decreased red blood cells - unusually weak or tired, fainting spells, lightheadedness -breathing problems -dark urine -dizziness -palpitations -swelling of the ankles, feet, hands -trouble passing urine or change in the amount of urine -weight gain -yellowing of the eyes or skin Side effects that usually do not require medical attention (report to your doctor or health care professional if  they continue or are bothersome): -changes in nail or skin color -hair loss -missed menstrual periods -mouth sores -nausea, vomiting This list may not describe all possible side effects. Call your doctor for medical advice about side effects. You may report side effects to FDA at 1-800-FDA-1088. Where should I keep my medicine? This drug is given in a hospital or clinic and will not be stored at home. NOTE: This sheet is a summary. It may not cover all possible information. If you have questions about this medicine, talk to your doctor, pharmacist, or health care provider.  2015, [employer] Standard. (2012-08-22 16:22:58)

## 2014-07-07 ENCOUNTER — Telehealth: Payer: Self-pay | Admitting: *Deleted

## 2014-07-07 ENCOUNTER — Ambulatory Visit (HOSPITAL_BASED_OUTPATIENT_CLINIC_OR_DEPARTMENT_OTHER): Payer: Medicare Other

## 2014-07-07 ENCOUNTER — Ambulatory Visit: Payer: Medicare Other

## 2014-07-07 VITALS — BP 134/76 | HR 83 | Temp 97.7°F

## 2014-07-07 DIAGNOSIS — C50919 Malignant neoplasm of unspecified site of unspecified female breast: Secondary | ICD-10-CM

## 2014-07-07 DIAGNOSIS — Z5189 Encounter for other specified aftercare: Secondary | ICD-10-CM

## 2014-07-07 DIAGNOSIS — C50412 Malignant neoplasm of upper-outer quadrant of left female breast: Secondary | ICD-10-CM

## 2014-07-07 DIAGNOSIS — C50419 Malignant neoplasm of upper-outer quadrant of unspecified female breast: Secondary | ICD-10-CM

## 2014-07-07 MED ORDER — PEGFILGRASTIM INJECTION 6 MG/0.6ML
6.0000 mg | Freq: Once | SUBCUTANEOUS | Status: AC
  Administered 2014-07-07: 6 mg via SUBCUTANEOUS
  Filled 2014-07-07: qty 0.6

## 2014-07-07 NOTE — Patient Instructions (Signed)
Call if temperature goes above 100.5.  May use Tylenol, Ibuprofen, or Aleve for discomfort.  Can take Claritin 10 mg daily as well for the bone aches for 5-7 days.   Pegfilgrastim injection What is this medicine? PEGFILGRASTIM (peg fil GRA stim) is a long-acting granulocyte colony-stimulating factor that stimulates the growth of neutrophils, a type of white blood cell important in the body's fight against infection. It is used to reduce the incidence of fever and infection in patients with certain types of cancer who are receiving chemotherapy that affects the bone marrow. This medicine may be used for other purposes; ask your health care provider or pharmacist if you have questions. COMMON BRAND NAME(S): Neulasta What should I tell my health care provider before I take this medicine? They need to know if you have any of these conditions: -latex allergy -ongoing radiation therapy -sickle cell disease -skin reactions to acrylic adhesives (On-Body Injector only) -an unusual or allergic reaction to pegfilgrastim, filgrastim, other medicines, foods, dyes, or preservatives -pregnant or trying to get pregnant -breast-feeding How should I use this medicine? This medicine is for injection under the skin. If you get this medicine at home, you will be taught how to prepare and give the pre-filled syringe or how to use the On-body Injector. Refer to the patient Instructions for Use for detailed instructions. Use exactly as directed. Take your medicine at regular intervals. Do not take your medicine more often than directed. It is important that you put your used needles and syringes in a special sharps container. Do not put them in a trash can. If you do not have a sharps container, call your pharmacist or healthcare provider to get one. Talk to your pediatrician regarding the use of this medicine in children. Special care may be needed. Overdosage: If you think you have taken too much of this medicine  contact a poison control center or emergency room at once. NOTE: This medicine is only for you. Do not share this medicine with others. What if I miss a dose? It is important not to miss your dose. Call your doctor or health care professional if you miss your dose. If you miss a dose due to an On-body Injector failure or leakage, a new dose should be administered as soon as possible using a single prefilled syringe for manual use. What may interact with this medicine? Interactions have not been studied. Give your health care provider a list of all the medicines, herbs, non-prescription drugs, or dietary supplements you use. Also tell them if you smoke, drink alcohol, or use illegal drugs. Some items may interact with your medicine. This list may not describe all possible interactions. Give your health care provider a list of all the medicines, herbs, non-prescription drugs, or dietary supplements you use. Also tell them if you smoke, drink alcohol, or use illegal drugs. Some items may interact with your medicine. What should I watch for while using this medicine? You may need blood work done while you are taking this medicine. If you are going to need a MRI, CT scan, or other procedure, tell your doctor that you are using this medicine (On-Body Injector only). What side effects may I notice from receiving this medicine? Side effects that you should report to your doctor or health care professional as soon as possible: -allergic reactions like skin rash, itching or hives, swelling of the face, lips, or tongue -dizziness -fever -pain, redness, or irritation at site where injected -pinpoint red spots on the skin -  shortness of breath or breathing problems -stomach or side pain, or pain at the shoulder -swelling -tiredness -trouble passing urine Side effects that usually do not require medical attention (report to your doctor or health care professional if they continue or are bothersome): -bone  pain -muscle pain This list may not describe all possible side effects. Call your doctor for medical advice about side effects. You may report side effects to FDA at 1-800-FDA-1088. Where should I keep my medicine? Keep out of the reach of children. Store pre-filled syringes in a refrigerator between 2 and 8 degrees C (36 and 46 degrees F). Do not freeze. Keep in carton to protect from light. Throw away this medicine if it is left out of the refrigerator for more than 48 hours. Throw away any unused medicine after the expiration date. NOTE: This sheet is a summary. It may not cover all possible information. If you have questions about this medicine, talk to your doctor, pharmacist, or health care provider.  2015, [employer] Standard. (2014-01-07 16:14:05)

## 2014-07-07 NOTE — Telephone Encounter (Signed)
[name] here for Neulasta injection following 1st taxotere/cytoxan chemo treatment.  States that she is nauseated this morning.  Vomited up her coffee this morning, had taken the antiemetic prior to vomiting.  She ate and drank well yesterday.  Didn't sleep much last night.  Encouraged to continue to drink and eat as tolerated and take her meds as needed.  All questions answered.  Knows to call if she has any problems or concerns.

## 2014-07-10 ENCOUNTER — Emergency Department (HOSPITAL_COMMUNITY): Payer: Medicare Other

## 2014-07-10 ENCOUNTER — Encounter (HOSPITAL_COMMUNITY): Payer: Self-pay | Admitting: Emergency Medicine

## 2014-07-10 ENCOUNTER — Inpatient Hospital Stay (HOSPITAL_COMMUNITY)
Admission: EM | Admit: 2014-07-10 | Discharge: 2014-07-13 | DRG: 864 | Disposition: A | Discharge status: Home / Self Care | Payer: Medicare Other | Attending: Family Medicine | Admitting: Family Medicine

## 2014-07-10 DIAGNOSIS — R05 Cough: Secondary | ICD-10-CM

## 2014-07-10 DIAGNOSIS — M791 Myalgia, unspecified site: Secondary | ICD-10-CM

## 2014-07-10 DIAGNOSIS — C50412 Malignant neoplasm of upper-outer quadrant of left female breast: Secondary | ICD-10-CM

## 2014-07-10 DIAGNOSIS — Z170 Estrogen receptor positive status [ER+]: Secondary | ICD-10-CM

## 2014-07-10 DIAGNOSIS — C50919 Malignant neoplasm of unspecified site of unspecified female breast: Secondary | ICD-10-CM | POA: Diagnosis present

## 2014-07-10 DIAGNOSIS — R509 Fever, unspecified: Secondary | ICD-10-CM | POA: Diagnosis not present

## 2014-07-10 DIAGNOSIS — J209 Acute bronchitis, unspecified: Secondary | ICD-10-CM | POA: Diagnosis present

## 2014-07-10 DIAGNOSIS — T4595XA Adverse effect of unspecified primarily systemic and hematological agent, initial encounter: Secondary | ICD-10-CM | POA: Diagnosis present

## 2014-07-10 DIAGNOSIS — D649 Anemia, unspecified: Secondary | ICD-10-CM | POA: Diagnosis present

## 2014-07-10 DIAGNOSIS — Z6841 Body Mass Index (BMI) 40.0 and over, adult: Secondary | ICD-10-CM

## 2014-07-10 DIAGNOSIS — F319 Bipolar disorder, unspecified: Secondary | ICD-10-CM | POA: Diagnosis present

## 2014-07-10 DIAGNOSIS — F17200 Nicotine dependence, unspecified, uncomplicated: Secondary | ICD-10-CM | POA: Diagnosis present

## 2014-07-10 DIAGNOSIS — Z79899 Other long term (current) drug therapy: Secondary | ICD-10-CM

## 2014-07-10 DIAGNOSIS — R059 Cough, unspecified: Secondary | ICD-10-CM

## 2014-07-10 DIAGNOSIS — IMO0001 Reserved for inherently not codable concepts without codable children: Secondary | ICD-10-CM | POA: Diagnosis present

## 2014-07-10 LAB — COMPREHENSIVE METABOLIC PANEL
ALT: 18 U/L (ref 0–35)
AST: 17 U/L (ref 0–37)
Albumin: 3.8 g/dL (ref 3.5–5.2)
Alkaline Phosphatase: 103 U/L (ref 39–117)
Anion gap: 14 (ref 5–15)
BUN: 12 mg/dL (ref 6–23)
CO2: 24 mEq/L (ref 19–32)
Calcium: 9.1 mg/dL (ref 8.4–10.5)
Chloride: 101 mEq/L (ref 96–112)
Creatinine, Ser: 0.85 mg/dL (ref 0.50–1.10)
GFR calc Af Amer: >90 mL/min (ref >90)
GFR calc non Af Amer: 83 mL/min — ABNORMAL LOW (ref >90)
Glucose, Bld: 107 mg/dL — ABNORMAL HIGH (ref 70–99)
Potassium: 3.5 mEq/L — ABNORMAL LOW (ref 3.7–5.3)
Sodium: 139 mEq/L (ref 137–147)
Total Bilirubin: 0.4 mg/dL (ref 0.3–1.2)
Total Protein: 7.3 g/dL (ref 6.0–8.3)

## 2014-07-10 LAB — I-STAT CG4 LACTIC ACID, ED: Lactic Acid, Venous: 1.61 mmol/L (ref 0.5–2.2)

## 2014-07-10 LAB — URINALYSIS, ROUTINE W REFLEX MICROSCOPIC
Bilirubin Urine: NEGATIVE
Glucose, UA: NEGATIVE mg/dL
Hgb urine dipstick: NEGATIVE
Ketones, ur: NEGATIVE mg/dL
Nitrite: NEGATIVE
Protein, ur: NEGATIVE mg/dL
Specific Gravity, Urine: 1.017 (ref 1.005–1.030)
Urobilinogen, UA: 0.2 mg/dL (ref 0.0–1.0)
pH: 6 (ref 5.0–8.0)

## 2014-07-10 LAB — CBC WITH DIFFERENTIAL/PLATELET
Basophils Absolute: 0.1 10*3/uL (ref 0.0–0.1)
Basophils Relative: 1 % (ref 0–1)
Eosinophils Absolute: 0.1 10*3/uL (ref 0.0–0.7)
Eosinophils Relative: 1 % (ref 0–5)
HCT: 39.1 % (ref 36.0–46.0)
Hemoglobin: 13.4 g/dL (ref 12.0–15.0)
Lymphocytes Relative: 29 % (ref 12–46)
Lymphs Abs: 2.9 10*3/uL (ref 0.7–4.0)
MCH: 25.8 pg — ABNORMAL LOW (ref 26.0–34.0)
MCHC: 34.3 g/dL (ref 30.0–36.0)
MCV: 75.3 fL — ABNORMAL LOW (ref 78.0–100.0)
Monocytes Absolute: 0.1 10*3/uL (ref 0.1–1.0)
Monocytes Relative: 1 % — ABNORMAL LOW (ref 3–12)
Neutro Abs: 6.8 10*3/uL (ref 1.7–7.7)
Neutrophils Relative %: 68 % (ref 43–77)
Platelets: 180 10*3/uL (ref 150–400)
RBC: 5.19 MIL/uL — ABNORMAL HIGH (ref 3.87–5.11)
RDW: 13.9 % (ref 11.5–15.5)
WBC Morphology: INCREASED
WBC: 10 10*3/uL (ref 4.0–10.5)

## 2014-07-10 LAB — LACTIC ACID, PLASMA: Lactic Acid, Venous: 1.8 mmol/L (ref 0.5–2.2)

## 2014-07-10 LAB — URINE MICROSCOPIC-ADD ON

## 2014-07-10 MED ORDER — IBUPROFEN 800 MG PO TABS
800.0000 mg | ORAL_TABLET | Freq: Once | ORAL | Status: AC
  Administered 2014-07-11: 800 mg via ORAL
  Filled 2014-07-10: qty 1

## 2014-07-10 MED ORDER — MORPHINE SULFATE 4 MG/ML IJ SOLN
4.0000 mg | Freq: Once | INTRAMUSCULAR | Status: AC
  Administered 2014-07-10: 4 mg via INTRAVENOUS
  Filled 2014-07-10: qty 1

## 2014-07-10 MED ORDER — IOHEXOL 350 MG/ML SOLN
100.0000 mL | Freq: Once | INTRAVENOUS | Status: AC | PRN
  Administered 2014-07-10: 100 mL via INTRAVENOUS

## 2014-07-10 MED ORDER — SODIUM CHLORIDE 0.9 % IV BOLUS (SEPSIS)
2000.0000 mL | Freq: Once | INTRAVENOUS | Status: AC
  Administered 2014-07-10: 2000 mL via INTRAVENOUS

## 2014-07-10 MED ORDER — ACETAMINOPHEN 325 MG PO TABS
650.0000 mg | ORAL_TABLET | Freq: Once | ORAL | Status: AC
  Administered 2014-07-11: 650 mg via ORAL
  Filled 2014-07-10: qty 2

## 2014-07-10 NOTE — ED Notes (Signed)
Pt presents with c/o shortness of breath that started last night. Pt reports that she is a breast cancer patient and her last chemo treatment was on 9/15. Pt is tachycardic in triage at 131 BPM. Pt also generalized body aches as well and says that her whole body is hurting.

## 2014-07-10 NOTE — ED Notes (Signed)
Patient husband going home, requesting phone call when dispo is made Libby Maw [phone]

## 2014-07-10 NOTE — ED Provider Notes (Signed)
CSN: [id number]     Arrival date & time 07/10/14  1823 History   First MD Initiated Contact with Patient 07/10/14 1904     Chief Complaint  Patient presents with  . Shortness of Breath      HPI Patient presents emergency department with complaints of shortness of breath and productive cough as well as fever.  Patient found to have a fever of 102 in the emergency department and presented with a heart rate of 131.  She complains of generalized body aches and myalgias.  She denies abdominal pain.  No urinary complaints.  She's had productive cough and some shortness of breath.  She denies rash.  She has a history of breast cancer and is currently undergoing chemotherapy.  Her last is a chemotherapy was 5 days ago.  Her symptoms are mild to moderate in severity   Past Medical History  Diagnosis Date  . Anemia   . Arthritis   . Schizophrenia   . Bipolar 1 disorder   . Blood transfusion without reported diagnosis 2007    post bleeding childbirth  . Cancer     eval  lt breast cancer  . Heart murmur     as a child-not adult-no cardiac work up  . Diabetes mellitus without complication     gestational   Past Surgical History  Procedure Laterality Date  . Tonsillectomy and adenoidectomy    . Dilation and curettage of uterus      after childbirth  . Multiple tooth extractions      only 5 left  . Breast biopsy Left 04/01/14  . Breast biopsy Right 04/19/14  . Breast lumpectomy with axillary lymph node dissection Bilateral 05/03/14  . Portacath placement Right 06/18/2014    Procedure: INSERTION PORT-A-CATH;  Surgeon: [name], MD;  Location: [facility];  Service: General;  Laterality: Right;   No family history on file. History  Substance Use Topics  . Smoking status: Current Every Day Smoker -- 1.00 packs/day for 32 years    Types: Cigarettes  . Smokeless tobacco: Never Used  . Alcohol Use: No   OB History   Grav Para Term Preterm Abortions TAB SAB Ect Mult  Living                 Review of Systems  All other systems reviewed and are negative.     Allergies  Review of patient's allergies indicates no known allergies.  Home Medications   Prior to Admission medications   Medication Sig Start Date End Date Taking? Authorizing Provider  citalopram (CELEXA) 40 MG tablet Take 40 mg by mouth daily.   Yes Historical Provider, MD  clonazePAM (KLONOPIN) 1 MG tablet daily. 03/21/14  Yes Historical Provider, MD  dexamethasone (DECADRON) 4 MG tablet Take 2 tablets (8 mg total) by mouth 2 (two) times daily. Start the day before Taxotere. Then again the day after chemo for 3 days. 07/02/14  Yes [name], MD  ferrous sulfate 325 (65 FE) MG tablet Take 1 tablet (325 mg total) by mouth daily with breakfast. 02/25/14  Yes Amber Fidel Levy, MD  lamoTRIgine (LAMICTAL) 200 MG tablet Take 200 mg by mouth 2 (two) times daily.   Yes Historical Provider, MD  lidocaine-prilocaine (EMLA) cream Apply 1 application topically as needed. Apply to port area 1-2 hours before chemotherapy, cover with plastic wrap 07/02/14  Yes [name], MD  LORazepam (ATIVAN) 0.5 MG tablet Take 1 tablet (0.5 mg total) by mouth  at bedtime as needed (Nausea or vomiting). 07/02/14  Yes [name], MD  ondansetron (ZOFRAN) 8 MG tablet Take 1 tablet (8 mg total) by mouth 2 (two) times daily. Start the day after chemo for 3 days. Then take as needed for nausea or vomiting. 07/02/14  Yes [name], MD  oxyCODONE-acetaminophen (PERCOCET) 10-325 MG per tablet Take 1 tablet by mouth every 6 (six) hours as needed for pain. 06/18/14 06/18/15 Yes [name], MD  prochlorperazine (COMPAZINE) 10 MG tablet Take 1 tablet (10 mg total) by mouth every 6 (six) hours as needed (Nausea or vomiting). 07/02/14  Yes [name], MD  QUEtiapine (SEROQUEL) 200 MG tablet Take 1 tablet (200 mg total) by mouth 2 (two) times daily. 03/09/14  Yes Amber Fidel Levy, MD  risperiDONE  (RISPERDAL) 0.25 MG tablet Take by mouth 2 (two) times daily. Unsure of dose. Given by mental health.   Yes Historical Provider, MD  zolpidem (AMBIEN) 10 MG tablet daily. 03/12/14  Yes Historical Provider, MD  prazosin (MINIPRESS) 2 MG capsule  07/01/14   Historical Provider, MD   BP 111/58  Pulse 124  Temp(Src) 102 F (38.9 C) (Oral)  Resp 19  SpO2 97% Physical Exam  Nursing note and vitals reviewed. Constitutional: She is oriented to person, place, and time. She appears well-developed and well-nourished. No distress.  HENT:  Head: Normocephalic and atraumatic.  Eyes: EOM are normal.  Neck: Normal range of motion.  Cardiovascular: Regular rhythm and normal heart sounds.   Tachycardic  Pulmonary/Chest: Effort normal and breath sounds normal.  Abdominal: Soft. She exhibits no distension. There is no tenderness.  Musculoskeletal: Normal range of motion.  Neurological: She is alert and oriented to person, place, and time.  Skin: Skin is warm and dry.  Psychiatric: She has a normal mood and affect. Judgment normal.    ED Course  Procedures (including critical care time) Labs Review Labs Reviewed  CBC WITH DIFFERENTIAL - Abnormal; Notable for the following:    RBC 5.19 (*)    MCV 75.3 (*)    MCH 25.8 (*)    Monocytes Relative 1 (*)    All other components within normal limits  COMPREHENSIVE METABOLIC PANEL - Abnormal; Notable for the following:    Potassium 3.5 (*)    Glucose, Bld 107 (*)    GFR calc non Af Amer 83 (*)    All other components within normal limits  URINALYSIS, ROUTINE W REFLEX MICROSCOPIC - Abnormal; Notable for the following:    Leukocytes, UA TRACE (*)    All other components within normal limits  CULTURE, BLOOD (ROUTINE X 2)  CULTURE, BLOOD (ROUTINE X 2)  URINE CULTURE  LACTIC ACID, PLASMA  URINE MICROSCOPIC-ADD ON  I-STAT CG4 LACTIC ACID, ED    Imaging Review Dg Chest Port 1 View  (if Code Sepsis Called)  07/10/2014   CLINICAL DATA:  Shortness of  breath  EXAM: PORTABLE CHEST - 1 VIEW  COMPARISON:  Chest radiograph 06/18/2014  FINDINGS: Right chest wall Port-A-Cath is present with tip projecting over the superior vena cava. Normal cardiac and mediastinal contours. No consolidative pulmonary opacities. No pleural effusion or pneumothorax left axillary surgical clips.  IMPRESSION: No acute cardiopulmonary process.   Electronically Signed   By: Lovey Newcomer M.D.   On: 07/10/2014 19:30  I personally reviewed the imaging tests through PACS system I reviewed available ER/hospitalization records through the EMR    EKG Interpretation   Date/Time:  Saturday July 10 2014 18:45:53 EDT Ventricular Rate:  124 PR Interval:  127 QRS Duration: 90 QT Interval:  336 QTC Calculation: 483 R Axis:   77 Text Interpretation:  Sinus tachycardia Borderline T abnormalities,  inferior leads No significant change was found Confirmed by Dafne Nield  MD,  Kendelle Schweers ([id number]) on 07/11/2014 1:23:29 AM      MDM   Final diagnoses:  Fever, unspecified fever cause    Patient feels much better after IV fluids and anti-diuretics.  She's had some pain medicine as well.  A chest x-ray is normal.  Urine shows no signs of infection.  Lactate is normal.  Blood cultures are pending.  Given the patient's complaint of shortness of breath and cough a CT scan was performed to evaluate for occult pneumonia no obvious pneumonia is noted.  She feels much better but continues to be tachycardic around 112 to 1:15.  Given her ongoing tachycardia and active cancer on chemotherapy the patient will will be admitted to the hospital for observation.  I spoke with the family medicine service at [facility] as she has been accepted in transfer.  Will hold antibiotics at this time    Hoy Morn, MD 07/11/14 [phone]

## 2014-07-11 ENCOUNTER — Encounter (HOSPITAL_COMMUNITY): Payer: Self-pay | Admitting: Family Medicine

## 2014-07-11 DIAGNOSIS — F319 Bipolar disorder, unspecified: Secondary | ICD-10-CM | POA: Diagnosis present

## 2014-07-11 DIAGNOSIS — C50419 Malignant neoplasm of upper-outer quadrant of unspecified female breast: Secondary | ICD-10-CM | POA: Diagnosis not present

## 2014-07-11 DIAGNOSIS — R059 Cough, unspecified: Secondary | ICD-10-CM | POA: Diagnosis not present

## 2014-07-11 DIAGNOSIS — Z79899 Other long term (current) drug therapy: Secondary | ICD-10-CM | POA: Diagnosis not present

## 2014-07-11 DIAGNOSIS — R05 Cough: Secondary | ICD-10-CM

## 2014-07-11 DIAGNOSIS — Z170 Estrogen receptor positive status [ER+]: Secondary | ICD-10-CM | POA: Diagnosis not present

## 2014-07-11 DIAGNOSIS — Z6841 Body Mass Index (BMI) 40.0 and over, adult: Secondary | ICD-10-CM | POA: Diagnosis not present

## 2014-07-11 DIAGNOSIS — R509 Fever, unspecified: Secondary | ICD-10-CM | POA: Diagnosis present

## 2014-07-11 DIAGNOSIS — IMO0001 Reserved for inherently not codable concepts without codable children: Secondary | ICD-10-CM | POA: Diagnosis present

## 2014-07-11 DIAGNOSIS — J209 Acute bronchitis, unspecified: Secondary | ICD-10-CM | POA: Diagnosis present

## 2014-07-11 DIAGNOSIS — D649 Anemia, unspecified: Secondary | ICD-10-CM | POA: Diagnosis present

## 2014-07-11 DIAGNOSIS — F17200 Nicotine dependence, unspecified, uncomplicated: Secondary | ICD-10-CM | POA: Diagnosis present

## 2014-07-11 DIAGNOSIS — T4595XA Adverse effect of unspecified primarily systemic and hematological agent, initial encounter: Secondary | ICD-10-CM | POA: Diagnosis present

## 2014-07-11 DIAGNOSIS — C50919 Malignant neoplasm of unspecified site of unspecified female breast: Secondary | ICD-10-CM | POA: Diagnosis present

## 2014-07-11 LAB — INFLUENZA PANEL BY PCR (TYPE A & B)
H1N1 flu by pcr: NOT DETECTED
Influenza A By PCR: NEGATIVE
Influenza B By PCR: NEGATIVE

## 2014-07-11 LAB — RETICULOCYTES
RBC.: 4.66 MIL/uL (ref 3.87–5.11)
Retic Ct Pct: <0.4 % — ABNORMAL LOW (ref 0.4–3.1)

## 2014-07-11 LAB — CBC
HCT: 36 % (ref 36.0–46.0)
Hemoglobin: 11.9 g/dL — ABNORMAL LOW (ref 12.0–15.0)
MCH: 25.4 pg — ABNORMAL LOW (ref 26.0–34.0)
MCHC: 33.1 g/dL (ref 30.0–36.0)
MCV: 76.8 fL — ABNORMAL LOW (ref 78.0–100.0)
Platelets: 166 10*3/uL (ref 150–400)
RBC: 4.69 MIL/uL (ref 3.87–5.11)
RDW: 14.2 % (ref 11.5–15.5)
WBC: 6.7 10*3/uL (ref 4.0–10.5)

## 2014-07-11 LAB — BASIC METABOLIC PANEL
Anion gap: 12 (ref 5–15)
BUN: 9 mg/dL (ref 6–23)
CO2: 24 mEq/L (ref 19–32)
Calcium: 8.4 mg/dL (ref 8.4–10.5)
Chloride: 101 mEq/L (ref 96–112)
Creatinine, Ser: 0.85 mg/dL (ref 0.50–1.10)
GFR calc Af Amer: >90 mL/min (ref >90)
GFR calc non Af Amer: 83 mL/min — ABNORMAL LOW (ref >90)
Glucose, Bld: 101 mg/dL — ABNORMAL HIGH (ref 70–99)
Potassium: 3.1 mEq/L — ABNORMAL LOW (ref 3.7–5.3)
Sodium: 137 mEq/L (ref 137–147)

## 2014-07-11 LAB — IRON AND TIBC
Iron: 128 ug/dL (ref 42–135)
Saturation Ratios: 42 % (ref 20–55)
TIBC: 306 ug/dL (ref 250–470)
UIBC: 178 ug/dL (ref 125–400)

## 2014-07-11 LAB — FOLATE: Folate: 11.6 ng/mL

## 2014-07-11 LAB — EXPECTORATED SPUTUM ASSESSMENT W GRAM STAIN, RFLX TO RESP C

## 2014-07-11 LAB — FERRITIN: Ferritin: 196 ng/mL (ref 10–291)

## 2014-07-11 LAB — VITAMIN B12: Vitamin B-12: 567 pg/mL (ref 211–911)

## 2014-07-11 LAB — EXPECTORATED SPUTUM ASSESSMENT W REFEX TO RESP CULTURE

## 2014-07-11 MED ORDER — ENOXAPARIN SODIUM 40 MG/0.4ML ~~LOC~~ SOLN
40.0000 mg | Freq: Every day | SUBCUTANEOUS | Status: DC
  Administered 2014-07-11 – 2014-07-13 (×3): 40 mg via SUBCUTANEOUS
  Filled 2014-07-11 (×3): qty 0.4

## 2014-07-11 MED ORDER — SODIUM CHLORIDE 0.9 % IJ SOLN
10.0000 mL | INTRAMUSCULAR | Status: DC | PRN
  Administered 2014-07-11 – 2014-07-13 (×2): 10 mL

## 2014-07-11 MED ORDER — SODIUM CHLORIDE 0.9 % IV SOLN
Freq: Once | INTRAVENOUS | Status: AC
  Administered 2014-07-11: 02:00:00 via INTRAVENOUS

## 2014-07-11 MED ORDER — HYDROMORPHONE HCL 1 MG/ML IJ SOLN
1.0000 mg | INTRAMUSCULAR | Status: DC | PRN
  Administered 2014-07-11 – 2014-07-12 (×3): 1 mg via INTRAVENOUS
  Filled 2014-07-11 (×3): qty 1

## 2014-07-11 MED ORDER — ONDANSETRON HCL 4 MG/2ML IJ SOLN: 4.0000 mg | Freq: Four times a day (QID) | INTRAMUSCULAR | Status: DC | PRN

## 2014-07-11 MED ORDER — ALBUTEROL SULFATE (2.5 MG/3ML) 0.083% IN NEBU
5.0000 mg | INHALATION_SOLUTION | Freq: Four times a day (QID) | RESPIRATORY_TRACT | Status: DC | PRN
  Administered 2014-07-11 (×2): 5 mg via RESPIRATORY_TRACT
  Filled 2014-07-11 (×2): qty 6

## 2014-07-11 MED ORDER — CITALOPRAM HYDROBROMIDE 40 MG PO TABS
40.0000 mg | ORAL_TABLET | Freq: Every day | ORAL | Status: DC
  Administered 2014-07-11 – 2014-07-13 (×3): 40 mg via ORAL
  Filled 2014-07-11 (×3): qty 1

## 2014-07-11 MED ORDER — CLONAZEPAM 1 MG PO TABS
1.0000 mg | ORAL_TABLET | Freq: Every day | ORAL | Status: DC
  Administered 2014-07-11 – 2014-07-13 (×3): 1 mg via ORAL
  Filled 2014-07-11 (×3): qty 1

## 2014-07-11 MED ORDER — ACETAMINOPHEN 650 MG RE SUPP
650.0000 mg | Freq: Four times a day (QID) | RECTAL | Status: DC | PRN
Start: 2014-07-11 — End: 2014-07-12

## 2014-07-11 MED ORDER — ACETAMINOPHEN 325 MG PO TABS
650.0000 mg | ORAL_TABLET | Freq: Four times a day (QID) | ORAL | Status: DC | PRN
  Administered 2014-07-11 (×2): 650 mg via ORAL
  Filled 2014-07-11 (×3): qty 2

## 2014-07-11 MED ORDER — QUETIAPINE FUMARATE 200 MG PO TABS
200.0000 mg | ORAL_TABLET | Freq: Two times a day (BID) | ORAL | Status: DC
  Administered 2014-07-11 – 2014-07-13 (×5): 200 mg via ORAL
  Filled 2014-07-11 (×6): qty 1

## 2014-07-11 MED ORDER — ONDANSETRON HCL 4 MG PO TABS: 4.0000 mg | ORAL_TABLET | Freq: Four times a day (QID) | ORAL | Status: DC | PRN

## 2014-07-11 MED ORDER — FERROUS SULFATE 325 (65 FE) MG PO TABS
325.0000 mg | ORAL_TABLET | Freq: Every day | ORAL | Status: DC
  Administered 2014-07-11 – 2014-07-13 (×3): 325 mg via ORAL
  Filled 2014-07-11 (×4): qty 1

## 2014-07-11 MED ORDER — SODIUM CHLORIDE 0.9 % IV SOLN
INTRAVENOUS | Status: DC
  Administered 2014-07-11 – 2014-07-12 (×4): via INTRAVENOUS

## 2014-07-11 MED ORDER — LAMOTRIGINE 200 MG PO TABS
200.0000 mg | ORAL_TABLET | Freq: Two times a day (BID) | ORAL | Status: DC
  Administered 2014-07-11 – 2014-07-13 (×5): 200 mg via ORAL
  Filled 2014-07-11 (×7): qty 1

## 2014-07-11 MED ORDER — POTASSIUM CHLORIDE CRYS ER 20 MEQ PO TBCR
40.0000 meq | EXTENDED_RELEASE_TABLET | Freq: Once | ORAL | Status: AC
  Administered 2014-07-11: 40 meq via ORAL
  Filled 2014-07-11: qty 2

## 2014-07-11 MED ORDER — MORPHINE SULFATE 2 MG/ML IJ SOLN
2.0000 mg | INTRAMUSCULAR | Status: DC | PRN
  Administered 2014-07-11 – 2014-07-12 (×3): 2 mg via INTRAVENOUS
  Filled 2014-07-11 (×4): qty 1

## 2014-07-11 MED ORDER — SODIUM CHLORIDE 0.9 % IJ SOLN
3.0000 mL | Freq: Two times a day (BID) | INTRAMUSCULAR | Status: DC
  Administered 2014-07-11 – 2014-07-12 (×2): 3 mL via INTRAVENOUS

## 2014-07-11 NOTE — H&P (Signed)
FMTS ATTENDING ADMISSION NOTE Kimberly Eniola,MD I  have seen and examined this patient, reviewed their chart. I have discussed this patient with the resident. I agree with the resident's findings, assessment and care plan.  43 y/o f with PMX breast cancer,Bipolar, Anemia presented with 3 days hx of cough, yellowish sputum production, fever and generalized muscle aches. Patient stated her husband has been sick at home as well but hers is worse. She denies n/V, no change in bowel habit. Hx is significant for chemotherapy for breast cancer. She also mentioned she does not take flu vaccine due to being on chemotherapy.   No current facility-administered medications on file prior to encounter.   Current Outpatient Prescriptions on File Prior to Encounter  Medication Sig Dispense Refill  . clonazePAM (KLONOPIN) 1 MG tablet daily.      Marland Kitchen dexamethasone (DECADRON) 4 MG tablet Take 2 tablets (8 mg total) by mouth 2 (two) times daily. Start the day before Taxotere. Then again the day after chemo for 3 days.  30 tablet  1  . ferrous sulfate 325 (65 FE) MG tablet Take 1 tablet (325 mg total) by mouth daily with breakfast.  90 tablet  3  . lidocaine-prilocaine (EMLA) cream Apply 1 application topically as needed. Apply to port area 1-2 hours before chemotherapy, cover with plastic wrap  30 g  0  . LORazepam (ATIVAN) 0.5 MG tablet Take 1 tablet (0.5 mg total) by mouth at bedtime as needed (Nausea or vomiting).  30 tablet  0  . ondansetron (ZOFRAN) 8 MG tablet Take 1 tablet (8 mg total) by mouth 2 (two) times daily. Start the day after chemo for 3 days. Then take as needed for nausea or vomiting.  30 tablet  1  . oxyCODONE-acetaminophen (PERCOCET) 10-325 MG per tablet Take 1 tablet by mouth every 6 (six) hours as needed for pain.  20 tablet  0  . prochlorperazine (COMPAZINE) 10 MG tablet Take 1 tablet (10 mg total) by mouth every 6 (six) hours as needed (Nausea or vomiting).  30 tablet  1  . QUEtiapine (SEROQUEL)  200 MG tablet Take 1 tablet (200 mg total) by mouth 2 (two) times daily.  60 tablet  5  . risperiDONE (RISPERDAL) 0.25 MG tablet Take by mouth 2 (two) times daily. Unsure of dose. Given by mental health.      . zolpidem (AMBIEN) 10 MG tablet daily.      . prazosin (MINIPRESS) 2 MG capsule        Past Medical History  Diagnosis Date  . Anemia   . Arthritis   . Bipolar 1 disorder   . Blood transfusion without reported diagnosis 2007    post bleeding childbirth  . Cancer     eval  lt breast cancer  . Heart murmur     as a child-not adult-no cardiac work up  . Diabetes mellitus without complication     gestational   Filed Vitals:   07/11/14 0130 07/11/14 0210 07/11/14 0252 07/11/14 0616  BP: 117/61  110/57 125/56  Pulse: 99  98 100  Temp:  98.8 F (37.1 C) 97.6 F (36.4 C) 98.2 F (36.8 C)  TempSrc:  Rectal Oral Oral  Resp:   20 20  Height:   5\' 8"  (1.727 m)   Weight:   270 lb 6.4 oz (122.653 kg)   SpO2: 93%  95% 93%   Exam: Gen: In mild distress due to pain. Neuro: awake and alert,oriented x 3. HEENT; EOMI,  PERRLA, no conjunctiva erythema. RESP: Air entry equal with mild rhonchi. CV; S1 S2 normal, no murmur Abd; Soft, NT/ND, BS+ and normal. Ext; No edema.  A/P; 43 y/O F with  1. Fever/ myalgia/ Cough with sputum production.     Chest xray normal. CTA reviewed and with no acute pulmonary pathology.     F/U blood culture.     Recommending sputum cx and POCT flu test.     Tylenol as needed for fever and pain control prn.     CBC, BMET check.     Assess hydration status and hydrate as needed.  2. Breast Cancer: curbside consult with her oncologist.  3. Other chronic conditions: Review home regimen and continue as appropriate.

## 2014-07-11 NOTE — Progress Notes (Signed)
Utilization Review Completed.   Wen Merced, RN, BSN Nurse Case Manager  

## 2014-07-11 NOTE — H&P (Signed)
[facility] Admission History and Physical Service Pager: [phone]  Patient name: Kimberly [name] Medical record number: [id number] Date of birth: [birth year] Age: 43 y.o. Gender: female  Primary Care Provider: Diona Fanti, DO Consultants: None Code Status: Full code  Chief Complaint: Body aches/myalgia, Fever  Assessment and Plan: Kimberly [name] is a 43 y.o. female presenting with fever and diffuse myalgias/pain. PMH is significant for Bipolar disorder, Tobacco abuse, anemia, and breast cancer (invasive ductal carcinoma, estrogen receptor and progesterone receptor positive).  Fever in the setting of invasive ductal carcinoma- Unclear source at this time.  Patient with reported SOB and cough.  Chest xray negative.  CTA negative for PE but did reveal bronchial thickening suggestive of bronchitis.  UA negative and patient without dysuria.  No leukopenia/neutropenia on labs. - Admit to telemetry, Attending Dr. Gwendlyn Deutscher - Follow up blood cultures - Holding empiric abx at this time, given no identified infectious source (fever could be secondary to malignancy) - Tylenol PRN - Will monitor fever curve closely - Daily CBC  Diffuse pain/myalgia - Patient reports her whole body aches.  No joint effusion or unremarkable physical exam findings. - Likely secondary to recent chemotherapy. - Will treat with Morphine 2 mg Q4H PRN  - Plan to transition quickly to PO  Breast cancer, left (invasive ductal carcinoma, estrogen receptor and progesterone receptor positive)- Followed by Dr. Jana Hakim. - Patient with known left breast cancer - Patient has also had right breast mass that was excised on 7/13 (pathology revealed intraductal papilloma). - CTA was done in ED and revealed a 3.9 x 2.7 cm RIGHT breast mass.  Will need to inform/discuss with Dr. Jana Hakim.  Hx of Anemia  - Continuing home Iron - Obtaining anemia panel  Bipolar disorder - Continued home  Seroquel. Celexa, Klonopin, Lamictal  FEN/GI: Heart healthy diet; NS - 100 mL/hr Prophylaxis: Lovenox  Disposition: Pending clinical improvement  History of Present Illness:  Kimberly [name] is a 43 y.o. female with a PMH of Bipolar disorder, Tobacco abuse, anemia, and breast cancer (invasive ductal carcinoma, estrogen receptor and progesterone receptor positive) who presents with fever and diffuse myalgias/pain.  Patient reports that she is currently undergoing chemotherapy for breast cancer.  She had chemotherapy (taxotere/cytoxan) on 9/15 and tolerated this well.  On 9/16 she had a Neulasta injection.  She states that after this she developed fever and severe, diffuse body aches and pain.  She's been battling this for a few days and with no improvement in her symptoms she decided to come to the emergency room for evaluation.  In addition to the above, patient endorsed shortness of breath, productive cough.  She also reported some nausea, vomiting, diarrhea, and diffuse mild abdominal pain.   Patient had extensive workup in emergency department with negative chest x-ray and CTA.  Laboratory workup was unremarkable, with no leukocytosis/anemia, normal lactic acid, normal UA.  Additionally, blood cultures obtained.  Patients symptoms improved following IV fluids and pain medication.  However, even with treatment patient's heart rate was still elevated.  This prompted them medicine the consult and for observation admission.   Review Of Systems: Per HPI. Otherwise 12 point review of systems was performed and was unremarkable.  Patient Active Problem List   Diagnosis Date Noted  . Fever 07/11/2014  . Breast cancer of upper-outer quadrant of left female breast 05/07/2014  . BLADDER PROLAPSE 09/29/2008  . LOW BACK PAIN, CHRONIC 09/29/2008  . OBESITY, MORBID 11/19/2007  . ANEMIA, IRON  DEFICIENCY, CHRONIC 11/14/2007  . BIPOLAR I, MIXED, MOST RECENT EPSD NOS 02/20/2007  . TOBACCO ABUSE  02/20/2007   Past Medical History: Past Medical History  Diagnosis Date  . Anemia   . Arthritis   . Bipolar 1 disorder   . Blood transfusion without reported diagnosis 2007    post bleeding childbirth  . Cancer     eval  lt breast cancer  . Heart murmur     as a child-not adult-no cardiac work up  . Diabetes mellitus without complication     gestational   Past Surgical History: Past Surgical History  Procedure Laterality Date  . Tonsillectomy and adenoidectomy    . Dilation and curettage of uterus      after childbirth  . Multiple tooth extractions      only 5 left  . Breast biopsy Left 04/01/14  . Breast biopsy Right 04/19/14  . Breast lumpectomy with axillary lymph node dissection Bilateral 05/03/14  . Portacath placement Right 06/18/2014    Procedure: INSERTION PORT-A-CATH;  Surgeon: [name], MD;  Location: [facility];  Service: General;  Laterality: Right;   Social History: History  Substance Use Topics  . Smoking status: Current Every Day Smoker -- 1.00 packs/day for 32 years    Types: Cigarettes  . Smokeless tobacco: Never Used  . Alcohol Use: No   Family History: No family history on file.  Allergies and Medications: No Known Allergies No current facility-administered medications on file prior to encounter.   Current Outpatient Prescriptions on File Prior to Encounter  Medication Sig Dispense Refill  . clonazePAM (KLONOPIN) 1 MG tablet daily.      Marland Kitchen dexamethasone (DECADRON) 4 MG tablet Take 2 tablets (8 mg total) by mouth 2 (two) times daily. Start the day before Taxotere. Then again the day after chemo for 3 days.  30 tablet  1  . ferrous sulfate 325 (65 FE) MG tablet Take 1 tablet (325 mg total) by mouth daily with breakfast.  90 tablet  3  . lidocaine-prilocaine (EMLA) cream Apply 1 application topically as needed. Apply to port area 1-2 hours before chemotherapy, cover with plastic wrap  30 g  0  . LORazepam (ATIVAN) 0.5 MG tablet  Take 1 tablet (0.5 mg total) by mouth at bedtime as needed (Nausea or vomiting).  30 tablet  0  . ondansetron (ZOFRAN) 8 MG tablet Take 1 tablet (8 mg total) by mouth 2 (two) times daily. Start the day after chemo for 3 days. Then take as needed for nausea or vomiting.  30 tablet  1  . oxyCODONE-acetaminophen (PERCOCET) 10-325 MG per tablet Take 1 tablet by mouth every 6 (six) hours as needed for pain.  20 tablet  0  . prochlorperazine (COMPAZINE) 10 MG tablet Take 1 tablet (10 mg total) by mouth every 6 (six) hours as needed (Nausea or vomiting).  30 tablet  1  . QUEtiapine (SEROQUEL) 200 MG tablet Take 1 tablet (200 mg total) by mouth 2 (two) times daily.  60 tablet  5  . risperiDONE (RISPERDAL) 0.25 MG tablet Take by mouth 2 (two) times daily. Unsure of dose. Given by mental health.      . zolpidem (AMBIEN) 10 MG tablet daily.      . prazosin (MINIPRESS) 2 MG capsule         Objective: BP 110/57  Pulse 98  Temp(Src) 97.6 F (36.4 C) (Oral)  Resp 20  Wt 270 lb 6.4 oz (122.653 kg)  SpO2 95% Exam: General: sitting up in bed, appears fatigued; NAD. HEENT: NCAT. Dry mucous membranes. Poor dentition. Cardiovascular: Tachycardic. Regular rhythm. Soft 2/6 systolic murmur noted at LUSB. Respiratory: Mild wheezing noted on exam.   Abdomen: obese, soft, nondistended. Mild tenderness diffusely. No palpable organomegaly. No rebound or guarding. Extremities: No LE edema.  Skin: warm, dry, intact.  Neuro: No focal deficits.   Labs and Imaging: CBC BMET   Recent Labs Lab 07/10/14 1850  WBC 10.0  HGB 13.4  HCT 39.1  PLT 180    Recent Labs Lab 07/10/14 1850  NA 139  K 3.5*  CL 101  CO2 24  BUN 12  CREATININE 0.85  GLUCOSE 107*  CALCIUM 9.1     Lactic acid - 1.8 (norm)  Urinalysis    Component Value Date/Time   COLORURINE YELLOW 07/10/2014 1951   APPEARANCEUR CLEAR 07/10/2014 1951   LABSPEC 1.017 07/10/2014 1951   PHURINE 6.0 07/10/2014 1951   GLUCOSEU NEGATIVE 07/10/2014 1951    HGBUR NEGATIVE 07/10/2014 1951   BILIRUBINUR NEGATIVE 07/10/2014 1951   KETONESUR NEGATIVE 07/10/2014 1951   PROTEINUR NEGATIVE 07/10/2014 1951   UROBILINOGEN 0.2 07/10/2014 1951   NITRITE NEGATIVE 07/10/2014 1951   LEUKOCYTESUR TRACE* 07/10/2014 1951   Ct Angio Chest W/cm &/or Wo Cm 07/10/2014    IMPRESSION: Suboptimal pulmonary arterial contrast opacification further degraded by mild respiratory motion. No convincing evidence of acute pulmonary embolism to at least the segmental branches.  Trace bronchial wall thickening. This could reflect reactive airway disease or possible bronchitis without focal consolidation.  Nonspecific RIGHT hilar lymphadenopathy. 3.9 x 2.7 cm RIGHT breast mass, recommend correlation with prior breast imaging. Surgical clips in LEFT breast.    Dg Chest Port 1 View   07/10/2014  IMPRESSION: No acute cardiopulmonary process.    Coral Spikes, DO 07/11/2014, 4:19 AM PGY-3, [place] Intern pager: [phone], text pages welcome

## 2014-07-11 NOTE — Evaluation (Signed)
Physical Therapy Evaluation Patient Details Name: Kimberly [name] MRN: [id number] DOB: 1970/12/06 Today's Date: 07/11/2014   History of Present Illness  43 y.o. female presenting with fever and diffuse myalgias/pain. PMH is significant for Bipolar disorder, Tobacco abuse, anemia, and breast cancer (invasive ductal carcinoma, estrogen receptor and progesterone receptor positive).  Clinical Impression  Pt is I to Mod I with all in room mobility.  Ambulation in hallway declined due to not feeling well.  Mobility limitations due to acute illness, but pt displays good balance and strength for return to PLOF when fever resolves.  No skilled PT intervention indicated.  PT signing off.    Follow Up Recommendations No PT follow up    Equipment Recommendations  None recommended by PT    Recommendations for Other Services       Precautions / Restrictions        Mobility  Bed Mobility Overal bed mobility: Independent                Transfers Overall transfer level: Independent                  Ambulation/Gait Ambulation/Gait assistance: Modified independent (Device/Increase time) Ambulation Distance (Feet): 50 Feet Assistive device: None Gait Pattern/deviations: WFL(Within Functional Limits) Gait velocity: decreased      Stairs            Wheelchair Mobility    Modified Rankin (Stroke Patients Only)       Balance Overall balance assessment: Modified Independent                                           Pertinent Vitals/Pain Pain Assessment: 0-10 Pain Location: "it just hurts all over" Pain Intervention(s): Limited activity within patient's tolerance    Home Living Family/patient expects to be discharged to:: Private residence Living Arrangements: Spouse/significant other Available Help at Discharge: Family;Available 24 hours/day Type of Home: House Home Access: Level entry     Home Layout: One level;Laundry or work area in  [employer]: None      Prior Function Level of Independence: Independent               Journalist, newspaper        Extremity/Trunk Assessment   Upper Extremity Assessment: Overall WFL for tasks assessed           Lower Extremity Assessment: Overall WFL for tasks assessed         Communication   Communication: No difficulties  Cognition Arousal/Alertness: Awake/alert Behavior During Therapy: WFL for tasks assessed/performed Overall Cognitive Status: Within Functional Limits for tasks assessed                      General Comments      Exercises        Assessment/Plan    PT Assessment Patent does not need any further PT services  PT Diagnosis     PT Problem List    PT Treatment Interventions     PT Goals (Current goals can be found in the Care Plan section) Acute Rehab PT Goals Patient Stated Goal: feel better PT Goal Formulation: No goals set, d/c therapy    Frequency     Barriers to discharge        Co-evaluation               End  of Session   Activity Tolerance: Patient limited by pain;Patient limited by fatigue Patient left: in bed;with family/visitor present;with call bell/phone within reach           Time: 1415-1429 PT Time Calculation (min): 14 min   Charges:   PT Evaluation $Initial PT Evaluation Tier I: 1 Procedure PT Treatments $Gait Training: 8-22 mins   PT G Codes:          Lorriane Shire 07/11/2014, 3:51 PM

## 2014-07-12 ENCOUNTER — Other Ambulatory Visit: Payer: Self-pay | Admitting: Oncology

## 2014-07-12 DIAGNOSIS — R509 Fever, unspecified: Principal | ICD-10-CM

## 2014-07-12 DIAGNOSIS — C50419 Malignant neoplasm of upper-outer quadrant of unspecified female breast: Secondary | ICD-10-CM

## 2014-07-12 DIAGNOSIS — IMO0001 Reserved for inherently not codable concepts without codable children: Secondary | ICD-10-CM

## 2014-07-12 LAB — BASIC METABOLIC PANEL
Anion gap: 12 (ref 5–15)
BUN: 4 mg/dL — ABNORMAL LOW (ref 6–23)
CO2: 24 mEq/L (ref 19–32)
Calcium: 8.9 mg/dL (ref 8.4–10.5)
Chloride: 103 mEq/L (ref 96–112)
Creatinine, Ser: 0.87 mg/dL (ref 0.50–1.10)
GFR calc Af Amer: >90 mL/min (ref >90)
GFR calc non Af Amer: 81 mL/min — ABNORMAL LOW (ref >90)
Glucose, Bld: 118 mg/dL — ABNORMAL HIGH (ref 70–99)
Potassium: 3.7 mEq/L (ref 3.7–5.3)
Sodium: 139 mEq/L (ref 137–147)

## 2014-07-12 LAB — CBC
HCT: 36.2 % (ref 36.0–46.0)
Hemoglobin: 11.7 g/dL — ABNORMAL LOW (ref 12.0–15.0)
MCH: 25.1 pg — ABNORMAL LOW (ref 26.0–34.0)
MCHC: 32.3 g/dL (ref 30.0–36.0)
MCV: 77.7 fL — ABNORMAL LOW (ref 78.0–100.0)
Platelets: 178 10*3/uL (ref 150–400)
RBC: 4.66 MIL/uL (ref 3.87–5.11)
RDW: 14.5 % (ref 11.5–15.5)
WBC: 5.8 10*3/uL (ref 4.0–10.5)

## 2014-07-12 LAB — URINE CULTURE
Colony Count: NO GROWTH
Culture: NO GROWTH

## 2014-07-12 MED ORDER — ACETAMINOPHEN 325 MG PO TABS
650.0000 mg | ORAL_TABLET | Freq: Four times a day (QID) | ORAL | Status: DC
  Administered 2014-07-12 – 2014-07-13 (×4): 650 mg via ORAL
  Filled 2014-07-12 (×5): qty 2

## 2014-07-12 MED ORDER — HYDROMORPHONE HCL 1 MG/ML IJ SOLN
1.0000 mg | INTRAMUSCULAR | Status: DC | PRN
  Administered 2014-07-12: 1 mg via INTRAVENOUS
  Filled 2014-07-12: qty 1

## 2014-07-12 MED ORDER — DOCUSATE SODIUM 100 MG PO CAPS
100.0000 mg | ORAL_CAPSULE | Freq: Two times a day (BID) | ORAL | Status: DC
  Administered 2014-07-12: 100 mg via ORAL
  Filled 2014-07-12 (×3): qty 1

## 2014-07-12 MED ORDER — KETOROLAC TROMETHAMINE 30 MG/ML IJ SOLN
30.0000 mg | Freq: Four times a day (QID) | INTRAMUSCULAR | Status: DC | PRN
  Administered 2014-07-12: 30 mg via INTRAVENOUS
  Filled 2014-07-12: qty 1

## 2014-07-12 MED ORDER — TRAMADOL HCL 50 MG PO TABS
50.0000 mg | ORAL_TABLET | Freq: Four times a day (QID) | ORAL | Status: DC | PRN
  Administered 2014-07-12 – 2014-07-13 (×3): 50 mg via ORAL
  Filled 2014-07-12 (×3): qty 1

## 2014-07-12 MED ORDER — POLYETHYLENE GLYCOL 3350 17 G PO PACK
17.0000 g | PACK | Freq: Every day | ORAL | Status: DC | PRN
Start: 2014-07-12 — End: 2014-07-13

## 2014-07-12 MED ORDER — IBUPROFEN 600 MG PO TABS
600.0000 mg | ORAL_TABLET | Freq: Four times a day (QID) | ORAL | Status: DC | PRN
  Filled 2014-07-12: qty 1

## 2014-07-12 MED ORDER — OXYCODONE HCL 5 MG PO TABS
5.0000 mg | ORAL_TABLET | Freq: Four times a day (QID) | ORAL | Status: DC
  Filled 2014-07-12: qty 1

## 2014-07-12 NOTE — Consult Note (Signed)
  COURTESY NOTE:  43 y.o. 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  (1) Oncotype score of 22 predicts a risk of outside the breast recurrence within 10 years of 13% if the patient's only systemic therapy is tamoxifen for 5 years.  (2) adjuvant chemotherapy with cyclophosphamide and docetaxel started 07/06/2014 , to be repeated Q 21 d x 4 (3) adjuvant radiation to follow chemotherapy  (4) adjuvant antiestrogen therapy for 5-10 years to follow radiation  Kimberly [name] received Neulasta 07/07/2014. Most likely she is experiencing aches and pains related to that drug. Rarely there is a first-dose reaction to decetaxel that can also cause similar (briefer) symptoms.  She is likely to become cytopenic in the next few days.   I would recommend NSAIDS, especially naproxen, and not narcotics for her pain and would prophylax with cipro if she remains AF but neutrophils nadir at <500.  Please let us know when patient is discharged-- we can arrange to see her the next day at the office. If she is still in house 07/14/2014 I will see her that morning.  Appreciate your excellent care of this patient! Please let me know if I can be of further help.

## 2014-07-12 NOTE — Progress Notes (Signed)
FMTS Attending Note Patient seen and examined by me, discussed with resident team.  Patient continues to complain of generalized myalgia, especially bad in her legs.  Has remained afebrile without initiation of antibiotics.  Consider checking CPK for possible rhabdomyolysis.  Patient received Taxotere/Cytoxan chemo regimen on Sept 15th, consider adverse reaction to Taxotere.  To contact her oncologist regarding likelihood of this scenario.   On exam her port appears clean and does not exhibit erythema or overt tenderness. Continue to follow clinically off antibiotics unless clinical changes present.  Dalbert Mayotte, MD

## 2014-07-12 NOTE — Progress Notes (Signed)
Family Medicine Teaching Service Daily Progress Note Intern Pager: [phone]  Patient name: Kimberly [name] Medical record number: [id number] Date of birth: 1971-07-30 Age: 43 y.o. Gender: female  Primary Care Provider: Diona Fanti, DO Consultants: None Code Status: Full  Pt Overview and Major Events to Date:  9/20 Admitted with fever and diffuse myaglias after receiving chemotherapy  Assessment and Plan: Kimberly [name] is a 43 y.o. female presenting with fever and diffuse myalgias/pain. PMH is significant for Bipolar disorder, Tobacco abuse, anemia, and breast cancer (invasive ductal carcinoma, estrogen receptor and progesterone receptor positive).   Fever in the setting of invasive ductal carcinoma- Afebrile since admission. Chest xray negative. CTA negative for PE but did reveal bronchial thickening suggestive of bronchitis. UA negative and patient without dysuria. No leukopenia/neutropenia on labs. Flu negative.  - Follow up blood cultures, urine cultures - Holding empiric abx at this time, given no identified infectious source (fever could be secondary to malignancy)  - Daily CBC   Diffuse pain/myalgia - Patient reports her whole body aches. Unremarkable physical exam. Likely secondary to chemotherapy. - Likely secondary to recent chemotherapy.  - Scheduled tylenol 650mg  q6hrs, oxycodone 5mg  q6hrs - Toradol 30mg  q6hrs prn, dilaudid 1mg  q3hrs prnprn - Bowel regimen  Acute bronchitis. Diffuse wheezes and ronchi on exam. CTA showed bronchial thickening c/w possible bronchitis. CXR without infiltrate. No history of asthma or COPD. -albuterol nebulizer prn -incentive spirometry -consider repeat CXR if patient worsens clinically  Breast cancer, left (invasive ductal carcinoma, estrogen receptor and progesterone receptor positive)- Followed by Dr. Jana Hakim.  - Patient with known left breast cancer  - Patient has also had right breast mass that was excised on 7/13 (pathology  revealed intraductal papilloma).  - CTA was done in ED and revealed a 3.9 x 2.7 cm RIGHT breast mass. Will need to inform/discuss with Dr. Jana Hakim.   Hx of Anemia  - Continuing home Iron  - Anemia panel here wnl  Bipolar disorder  - Continued home Seroquel. Celexa, Klonopin, Lamictal   FEN/GI: Heart healthy diet; NS - 100 mL/hr  Prophylaxis: Lovenox  Disposition: Admitted pending pain control.  Subjective:  Still has significant pain this morning. Denies fevers or chills. No chest pain or shortness of breath.   Objective: Temp:  [98.4 F (36.9 C)-98.8 F (37.1 C)] 98.8 F (37.1 C) (09/21 0726) Pulse Rate:  [92-108] 93 (09/21 0726) Resp:  [16-18] 16 (09/21 0726) BP: (103-125)/(48-71) 125/71 mmHg (09/21 0726) SpO2:  [93 %-98 %] 98 % (09/21 0726) Physical Exam: General: Lying in bed, appears fatigued; NAD.  Cardiovascular: Regular rate and rhythm. Soft 2/6 systolic murmur noted at LUSB.  Respiratory: Diffuse wheezing Abdomen: obese, soft, nondistended. Mild tenderness diffusely. No palpable organomegaly. No rebound or guarding.  Extremities: No LE edema.  Skin: warm, dry, intact.  Neuro: No focal deficits.    Laboratory:  Recent Labs Lab 07/06/14 0823 07/10/14 1850 07/11/14 0542  WBC 11.8* 10.0 6.7  HGB 14.1 13.4 11.9*  HCT 41.9 39.1 36.0  PLT 241 180 166    Recent Labs Lab 07/10/14 1850 07/11/14 0542  NA 139 137  K 3.5* 3.1*  CL 101 101  CO2 24 24  BUN 12 9  CREATININE 0.85 0.85  CALCIUM 9.1 8.4  PROT 7.3  --   BILITOT 0.4  --   ALKPHOS 103  --   ALT 18  --   AST 17  --   GLUCOSE 107* 101*    Dimas Chyle, MD 07/12/2014,  9:24 AM PGY-1, [place] Intern pager: [phone], text pages welcome

## 2014-07-12 NOTE — Discharge Summary (Signed)
[facility] Discharge Summary  Patient name: [name] record number: [id number] Date of birth: [birth year] Age: 43 y.o. Gender: female Date of Admission: 07/10/2014  Date of Discharge: 07/13/2014  Admitting Physician: Andrena Mews, MD  Primary Care Provider: Diona Fanti, DO Consultants: None  Indication for Hospitalization: Fever and Diffuse mylagias  Discharge Diagnoses/Problem List:  Fever and pain likely secondary to adverse effects of neulasta injection, Acute bronchitis, breast cancer, anemia, bipolar disorder  Disposition: Home  Discharge Condition: Improved  Discharge Exam:  BP 115/83  Pulse 84  Temp(Src) 97.5 F (36.4 C) (Oral)  Resp 16  Ht 5\' 8"  (1.727 m)  Wt 270 lb 6.4 oz (122.653 kg)  BMI 41.12 kg/m2  SpO2 97% General: Lying in bed,NAD.  Cardiovascular: RRR, no murmurs appreciated Respiratory: NWOB,  Diffuse wheezing and upper airways sounds transmitted Abdomen: +BS, obese, soft, nondistended. Mild tenderness diffusely. No palpable organomegaly. No rebound or guarding.  Extremities: No LE edema.  Skin: warm, dry Neuro: No focal deficits.   Brief Hospital Course:  SUZZANE [name] is a 43 y.o. female presenting with fever and diffuse myalgias/pain. PMH is significant for Bipolar disorder, Tobacco abuse, anemia, and breast cancer (invasive ductal carcinoma, estrogen receptor and progesterone receptor positive).   Her hospital course, by problem, is listed below:  Fever and diffuse pain, likely secondary to adverse reaction to Neulasta injection. Patient's infectious work up was largely unrevealing. She had a negative CXR and UA. WBC was within normal limits with no leukopenia/neutropenia. Flu PCR was negative. She did have a CTA that was negative for PE but did reveal bronchial thickening suggestive of bronchitis. Blood cultures and urine cultures were obtained which showed no growth by the time of discharge. She  was initially febrile to 102F in the ED, but was afebrile afterwards until the time of discharge. Her pain was diffuse with no focal findings on physical exam. Her pain gradually improved and was eventually able to be adequately controlled on tylenol and ibuprofen at the time of discharge. The patient's primary oncologist, Dr Kimberly [name] was called and attributed the patient's symptoms to most likely be due to [facility] which she received a few days prior to admission, or less likely related to decetaxel, which she also started a few days prior to admission.   Acute bronchitis. As mentioned above, the patient's CTA showed bronchial thickening consistent with possible bronchitis. She additionally had diffuse wheezes and ronchi on exam.  Patient stated that her symptoms only started a day prior to admission. No further intervention or management was pursued during this admission. She was instructed to follow up in the clinic if her symptoms are not improving within 10 days.  Breast cancer, left. (invasive ductal carcinoma, estrogen receptor and progesterone receptor positive). Patient is followed by Dr. Jana [name] as discussed above. Patient was incidentally found to have a right 3.9 x 2.7cm breast mass. This was discussed with the patient's primary oncologist for outpatient work up.   Hx of Anemia. Anemia panel here was within normal limits. Continued the patient on her home iron supplementation.   Bipolar disorder. Continued home Seroquel. Celexa, Klonopin, Lamictal   Issues for Follow Up:  1) f/u right breast mass (in setting on known left breast cancer) as outpatient 2) f/u resolution of acute bronchitis 3) f/u pain and further fevers/chills  Significant Procedures: None  Significant Labs and Imaging:   Recent Labs Lab 07/11/14 0542 07/12/14 1330 07/13/14 0543  WBC 6.7 5.8 7.8  HGB 11.9* 11.7* 11.6*  HCT 36.0 36.2 35.6*  PLT 166 178 177    Recent Labs Lab 07/10/14 1850 07/11/14 0542  07/12/14 1330 07/13/14 0543  NA 139 137 139 140  K 3.5* 3.1* 3.7 4.0  CL 101 101 103 104  CO2 24 24 24 25   GLUCOSE 107* 101* 118* 84  BUN 12 9 4* 5*  CREATININE 0.85 0.85 0.87 0.94  CALCIUM 9.1 8.4 8.9 8.7  ALKPHOS 103  --   --   --   AST 17  --   --   --   ALT 18  --   --   --   ALBUMIN 3.8  --   --   --     Ct Angio Chest W/cm &/or Wo Cm 07/10/2014    IMPRESSION: Suboptimal pulmonary arterial contrast opacification further degraded by mild respiratory motion. No convincing evidence of acute pulmonary embolism to at least the segmental branches.  Trace bronchial wall thickening. This could reflect reactive airway disease or possible bronchitis without focal consolidation.  Nonspecific RIGHT hilar lymphadenopathy. 3.9 x 2.7 cm RIGHT breast mass, recommend correlation with prior breast imaging. Surgical clips in LEFT breast.     Dg Chest Port 1 View  07/10/2014    IMPRESSION: No acute cardiopulmonary process.      Results/Tests Pending at Time of Discharge: None  Discharge Medications:    Medication List         citalopram 40 MG tablet  Commonly known as:  CELEXA  Take 40 mg by mouth daily.     clonazePAM 1 MG tablet  Commonly known as:  KLONOPIN  daily.     dexamethasone 4 MG tablet  Commonly known as:  DECADRON  Take 2 tablets (8 mg total) by mouth 2 (two) times daily. Start the day before Taxotere. Then again the day after chemo for 3 days.     ferrous sulfate 325 (65 FE) MG tablet  Take 1 tablet (325 mg total) by mouth daily with breakfast.     lamoTRIgine 200 MG tablet  Commonly known as:  LAMICTAL  Take 200 mg by mouth 2 (two) times daily.     lidocaine-prilocaine cream  Commonly known as:  EMLA  Apply 1 application topically as needed. Apply to port area 1-2 hours before chemotherapy, cover with plastic wrap     LORazepam 0.5 MG tablet  Commonly known as:  ATIVAN  Take 1 tablet (0.5 mg total) by mouth at bedtime as needed (Nausea or vomiting).      naproxen 500 MG tablet  Commonly known as:  NAPROSYN  Take 1 tablet (500 mg total) by mouth 2 (two) times daily with a meal.     ondansetron 8 MG tablet  Commonly known as:  ZOFRAN  Take 1 tablet (8 mg total) by mouth 2 (two) times daily. Start the day after chemo for 3 days. Then take as needed for nausea or vomiting.     oxyCODONE-acetaminophen 10-325 MG per tablet  Commonly known as:  PERCOCET  Take 1 tablet by mouth every 6 (six) hours as needed for pain.     prazosin 2 MG capsule  Commonly known as:  MINIPRESS     prochlorperazine 10 MG tablet  Commonly known as:  COMPAZINE  Take 1 tablet (10 mg total) by mouth every 6 (six) hours as needed (Nausea or vomiting).     QUEtiapine 200 MG tablet  Commonly known as:  SEROQUEL  Take 1 tablet (  200 mg total) by mouth 2 (two) times daily.     risperiDONE 0.25 MG tablet  Commonly known as:  RISPERDAL  Take by mouth 2 (two) times daily. Unsure of dose. Given by mental health.     zolpidem 10 MG tablet  Commonly known as:  AMBIEN  daily.        Discharge Instructions: Please refer to Patient Instructions section of EMR for full details.  Patient was counseled important signs and symptoms that should prompt return to medical care, changes in medications, dietary instructions, activity restrictions, and follow up appointments.   Follow-Up Appointments: Follow-up Information   Follow up with Melancon, York Ram, MD On 07/19/2014. (3:30pm for hospital follow up)    Specialty:  Family Medicine   Contact information:   [address] [place] [address] [phone]       Follow up with [name], MD. Call today.   Specialty:  Oncology   Contact information:   [place] [address] [phone]       Dimas Chyle, MD 07/13/2014, 1:10 PM PGY-1, [place]

## 2014-07-13 ENCOUNTER — Telehealth: Payer: Self-pay | Admitting: Adult Health

## 2014-07-13 ENCOUNTER — Other Ambulatory Visit: Payer: Medicare Other

## 2014-07-13 ENCOUNTER — Ambulatory Visit: Payer: Medicare Other | Admitting: Adult Health

## 2014-07-13 LAB — CULTURE, RESPIRATORY W GRAM STAIN: Culture: NORMAL

## 2014-07-13 LAB — BASIC METABOLIC PANEL
Anion gap: 11 (ref 5–15)
BUN: 5 mg/dL — ABNORMAL LOW (ref 6–23)
CO2: 25 mEq/L (ref 19–32)
Calcium: 8.7 mg/dL (ref 8.4–10.5)
Chloride: 104 mEq/L (ref 96–112)
Creatinine, Ser: 0.94 mg/dL (ref 0.50–1.10)
GFR calc Af Amer: 86 mL/min — ABNORMAL LOW (ref >90)
GFR calc non Af Amer: 74 mL/min — ABNORMAL LOW (ref >90)
Glucose, Bld: 84 mg/dL (ref 70–99)
Potassium: 4 mEq/L (ref 3.7–5.3)
Sodium: 140 mEq/L (ref 137–147)

## 2014-07-13 LAB — CBC
HCT: 35.6 % — ABNORMAL LOW (ref 36.0–46.0)
Hemoglobin: 11.6 g/dL — ABNORMAL LOW (ref 12.0–15.0)
MCH: 25.6 pg — ABNORMAL LOW (ref 26.0–34.0)
MCHC: 32.6 g/dL (ref 30.0–36.0)
MCV: 78.4 fL (ref 78.0–100.0)
Platelets: 177 10*3/uL (ref 150–400)
RBC: 4.54 MIL/uL (ref 3.87–5.11)
RDW: 14.5 % (ref 11.5–15.5)
WBC: 7.8 10*3/uL (ref 4.0–10.5)

## 2014-07-13 LAB — CULTURE, RESPIRATORY

## 2014-07-13 LAB — CK: Total CK: 31 U/L (ref 7–177)

## 2014-07-13 MED ORDER — NAPROXEN 500 MG PO TABS
500.0000 mg | ORAL_TABLET | Freq: Two times a day (BID) | ORAL | Status: DC
  Administered 2014-07-13: 500 mg via ORAL
  Filled 2014-07-13 (×3): qty 1

## 2014-07-13 MED ORDER — NAPROXEN 500 MG PO TABS: 500.0000 mg | ORAL_TABLET | Freq: Two times a day (BID) | ORAL | Status: DC

## 2014-07-13 MED ORDER — HEPARIN SOD (PORK) LOCK FLUSH 100 UNIT/ML IV SOLN
500.0000 [IU] | INTRAVENOUS | Status: AC | PRN
  Administered 2014-07-13: 500 [IU]

## 2014-07-13 NOTE — Telephone Encounter (Signed)
m, °

## 2014-07-13 NOTE — Discharge Instructions (Signed)
You were admitted to the hospital with pain and fever. While here you had blood tests done that showed no signs of infection. For your pain, you can continue to take tylenol and naproxen. The pain and fever were likely due to the Neulasta injection you recently received. The coughing and phlegm production you had were likely due to a viral bronchitis. This should pass on its own in about a week. If it does not get better, please call or come into the clinic.   Acute Bronchitis Bronchitis is inflammation of the airways that extend from the windpipe into the lungs (bronchi). The inflammation often causes mucus to develop. This leads to a cough, which is the most common symptom of bronchitis.  In acute bronchitis, the condition usually develops suddenly and goes away over time, usually in a couple weeks. Smoking, allergies, and asthma can make bronchitis worse. Repeated episodes of bronchitis may cause further lung problems.  CAUSES Acute bronchitis is most often caused by the same virus that causes a cold. The virus can spread from person to person (contagious) through coughing, sneezing, and touching contaminated objects. SIGNS AND SYMPTOMS   Cough.   Fever.   Coughing up mucus.   Body aches.   Chest congestion.   Chills.   Shortness of breath.   Sore throat.  DIAGNOSIS  Acute bronchitis is usually diagnosed through a physical exam. Your health care provider will also ask you questions about your medical history. Tests, such as chest X-rays, are sometimes done to rule out other conditions.  TREATMENT  Acute bronchitis usually goes away in a couple weeks. Oftentimes, no medical treatment is necessary. Medicines are sometimes given for relief of fever or cough. Antibiotic medicines are usually not needed but may be prescribed in certain situations. In some cases, an inhaler may be recommended to help reduce shortness of breath and control the cough. A cool mist vaporizer may also be  used to help thin bronchial secretions and make it easier to clear the chest.  HOME CARE INSTRUCTIONS  Get plenty of rest.   Drink enough fluids to keep your urine clear or pale yellow (unless you have a medical condition that requires fluid restriction). Increasing fluids may help thin your respiratory secretions (sputum) and reduce chest congestion, and it will prevent dehydration.   Take medicines only as directed by your health care provider.  If you were prescribed an antibiotic medicine, finish it all even if you start to feel better.  Avoid smoking and secondhand smoke. Exposure to cigarette smoke or irritating chemicals will make bronchitis worse. If you are a smoker, consider using nicotine gum or skin patches to help control withdrawal symptoms. Quitting smoking will help your lungs heal faster.   Reduce the chances of another bout of acute bronchitis by washing your hands frequently, avoiding people with cold symptoms, and trying not to touch your hands to your mouth, nose, or eyes.   Keep all follow-up visits as directed by your health care provider.  SEEK MEDICAL CARE IF: Your symptoms do not improve after 1 week of treatment.  SEEK IMMEDIATE MEDICAL CARE IF:  You develop an increased fever or chills.   You have chest pain.   You have severe shortness of breath.  You have bloody sputum.   You develop dehydration.  You faint or repeatedly feel like you are going to pass out.  You develop repeated vomiting.  You develop a severe headache. MAKE SURE YOU:   Understand these instructions.  Will watch your condition.  Will get help right away if you are not doing well or get worse. Document Released: 11/15/2004 Document Revised: 02/22/2014 Document Reviewed: 03/31/2013 [facility] Patient Information 2015 [place], [place]. This information is not intended to replace advice given to you by your health care provider. Make sure you discuss any questions you have  with your health care provider.

## 2014-07-14 ENCOUNTER — Telehealth: Payer: Self-pay | Admitting: Adult Health

## 2014-07-14 NOTE — Discharge Summary (Signed)
Patient seen and examined by me on day of discharge, discussed with resident team and I agree with Dr Marigene Ehlers note as documented.  Kimberly Mayotte, MD

## 2014-07-14 NOTE — Telephone Encounter (Signed)
, °

## 2014-07-15 ENCOUNTER — Other Ambulatory Visit: Payer: Self-pay | Admitting: *Deleted

## 2014-07-15 DIAGNOSIS — C50412 Malignant neoplasm of upper-outer quadrant of left female breast: Secondary | ICD-10-CM

## 2014-07-16 ENCOUNTER — Encounter: Payer: Self-pay | Admitting: Adult Health

## 2014-07-16 ENCOUNTER — Ambulatory Visit (HOSPITAL_BASED_OUTPATIENT_CLINIC_OR_DEPARTMENT_OTHER): Payer: Medicare Other | Admitting: Adult Health

## 2014-07-16 ENCOUNTER — Telehealth: Payer: Self-pay | Admitting: Adult Health

## 2014-07-16 ENCOUNTER — Other Ambulatory Visit (HOSPITAL_BASED_OUTPATIENT_CLINIC_OR_DEPARTMENT_OTHER): Payer: Medicare Other

## 2014-07-16 ENCOUNTER — Ambulatory Visit (HOSPITAL_BASED_OUTPATIENT_CLINIC_OR_DEPARTMENT_OTHER): Payer: Medicare Other

## 2014-07-16 VITALS — BP 134/94 | HR 113 | Temp 98.3°F | Resp 18 | Ht 68.0 in | Wt 269.5 lb

## 2014-07-16 DIAGNOSIS — E860 Dehydration: Secondary | ICD-10-CM

## 2014-07-16 DIAGNOSIS — R062 Wheezing: Secondary | ICD-10-CM

## 2014-07-16 DIAGNOSIS — C50412 Malignant neoplasm of upper-outer quadrant of left female breast: Secondary | ICD-10-CM

## 2014-07-16 DIAGNOSIS — Z170 Estrogen receptor positive status [ER+]: Secondary | ICD-10-CM

## 2014-07-16 DIAGNOSIS — C50419 Malignant neoplasm of upper-outer quadrant of unspecified female breast: Secondary | ICD-10-CM

## 2014-07-16 DIAGNOSIS — R197 Diarrhea, unspecified: Secondary | ICD-10-CM

## 2014-07-16 DIAGNOSIS — J069 Acute upper respiratory infection, unspecified: Secondary | ICD-10-CM

## 2014-07-16 DIAGNOSIS — J40 Bronchitis, not specified as acute or chronic: Secondary | ICD-10-CM

## 2014-07-16 LAB — COMPREHENSIVE METABOLIC PANEL (CC13)
ALT: 11 U/L (ref 0–55)
AST: 16 U/L (ref 5–34)
Albumin: 3.9 g/dL (ref 3.5–5.0)
Alkaline Phosphatase: 86 U/L (ref 40–150)
Anion Gap: 12 mEq/L — ABNORMAL HIGH (ref 3–11)
BUN: 12 mg/dL (ref 7.0–26.0)
CO2: 23 mEq/L (ref 22–29)
Calcium: 10.1 mg/dL (ref 8.4–10.4)
Chloride: 104 mEq/L (ref 98–109)
Creatinine: 1.1 mg/dL (ref 0.6–1.1)
Glucose: 98 mg/dl (ref 70–140)
Potassium: 4.1 mEq/L (ref 3.5–5.1)
Sodium: 139 mEq/L (ref 136–145)
Total Bilirubin: 0.2 mg/dL (ref 0.20–1.20)
Total Protein: 7.7 g/dL (ref 6.4–8.3)

## 2014-07-16 LAB — CBC WITH DIFFERENTIAL/PLATELET
BASO%: 1.6 % (ref 0.0–2.0)
Basophils Absolute: 0.3 10*3/uL — ABNORMAL HIGH (ref 0.0–0.1)
EOS%: 0.3 % (ref 0.0–7.0)
Eosinophils Absolute: 0.1 10*3/uL (ref 0.0–0.5)
HCT: 42 % (ref 34.8–46.6)
HGB: 14.1 g/dL (ref 11.6–15.9)
LYMPH%: 16.8 % (ref 14.0–49.7)
MCH: 26 pg (ref 25.1–34.0)
MCHC: 33.6 g/dL (ref 31.5–36.0)
MCV: 77.5 fL — ABNORMAL LOW (ref 79.5–101.0)
MONO#: 2.2 10*3/uL — ABNORMAL HIGH (ref 0.1–0.9)
MONO%: 11.2 % (ref 0.0–14.0)
NEUT#: 13.7 10*3/uL — ABNORMAL HIGH (ref 1.5–6.5)
NEUT%: 70.1 % (ref 38.4–76.8)
Platelets: 210 10*3/uL (ref 145–400)
RBC: 5.42 10*6/uL (ref 3.70–5.45)
RDW: 14.9 % — ABNORMAL HIGH (ref 11.2–14.5)
WBC: 19.6 10*3/uL — ABNORMAL HIGH (ref 3.9–10.3)
lymph#: 3.3 10*3/uL (ref 0.9–3.3)
nRBC: 1 % — ABNORMAL HIGH (ref 0–0)

## 2014-07-16 MED ORDER — AZITHROMYCIN 250 MG PO TABS: ORAL_TABLET | ORAL | Status: DC

## 2014-07-16 MED ORDER — ALBUTEROL SULFATE HFA 108 (90 BASE) MCG/ACT IN AERS: 2.0000 | INHALATION_SPRAY | Freq: Four times a day (QID) | RESPIRATORY_TRACT | Status: DC | PRN

## 2014-07-16 MED ORDER — SODIUM CHLORIDE 0.9 % IV SOLN
1000.0000 mL | Freq: Once | INTRAVENOUS | Status: AC
  Administered 2014-07-16: 1000 mL via INTRAVENOUS

## 2014-07-16 NOTE — Patient Instructions (Signed)

## 2014-07-16 NOTE — Progress Notes (Signed)
[place]  Telephone:([phone] Fax:([phone]) [phone]     ID: LAYSA KIMMEY DOB: 1971/08/09  MR#: [id number]  YKD#:[id number]  Patient Care Team: [name], DO as PCP - General [name], MD as Consulting Physician (General Surgery) [name], MD as Consulting Physician (Oncology) [name], MD as Attending Physician (Radiation Oncology)  CHIEF COMPLAINT: Estrogen receptor positive breast cancer  CURRENT TREATMENT: Adjuvant chemotherapy   BREAST CANCER HISTORY: From the original intake note:  The patient had bilateral screening mammography with tomography 03/19/2014. This was the patient's first ever mammography. Showed a possible mass in the left breast. Left diagnostic mammography and ultrasonography 04/01/2014 showed an irregular mass in the upper outer quadrant of the left breast with a possible satellite 1 cm anterior to it. On physical exam there was a firm palpable mass at the 1:00 position of the left breast 8 cm from the nipple. There was no palpable left axillary adenopathy. Ultrasound showed an irregular hypoechoic mass in the area in question measuring 1.4 cm. There was a 4 mm nodule located anterior to this. Ultrasound of the left axilla was benign.  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 The second mass was negative for malignancy. This was felt to be concordant.  On 04/08/2014 the patient underwent bilateral breast MRI. This showed a 9 mm enhancing mass in the subareolar area of the right breast in in the left breast, the previously noted mass measuring 1.3 cm. There were no morphologically abnormal lymph nodes  The right  breast finding was followed up on 04/19/2014 with ultrasound which showed an intraductal soft tissue mass in the inferior subareolar worsening of the right breast measuring 8 mm. This was biopsied 04/19/2014 and showed ([id number]) ectatic duct, with no evidence of malignancy. Surgical excision was recommended.  Accordingly on 05/03/2014 the patient underwent right lumpectomy, showing an intraductal papilloma, with known malignancy identified. Left lumpectomy on the same day showed an invasive ductal carcinoma measuring 1.5 cm, with one of the 2 sentinel lymph nodes positive for carcinoma. There was no extracapsular extension. Margins were clear and ample. HER-2 was repeated and was again negative.  The patient's case was discussed in the multidisciplinary breast cancer conference 05/12/2014. An Oncotype had been previously requested and this showed a recurrent score of 22, in the intermediate range. The patient qualifies for the S1007 study and this will be discussed with her. Otherwise the standard recommendation would be chemotherapy followed by radiation followed by anti-estrogens.  The patient's subsequent history is as detailed below.  INTERVAL HISTORY: [name] is here with her husband [name] for evaluation after receiving her first chemotherapy treatment.  She is cycle 1 day 13 of 4 planned treatments with Docetaxel and Cyclophosphamide.    She was admitted to the hospital on 07/10/14 and was released on 07/13/14.  She was dehydrated and did have a lot of difficulty with pain caused by Neulasta.  She has been having diarrhea since she received chemotherapy on 07/06/14.  If she drinks any fluid she is having diarrhea.  She is not taking anything for the diarrhea.  She denies abdominal pain and cramping.  She is nauseated.  She takes Ondansetron and Compazine  for the nausea and it typically makes it better.  She is trying to drink fluids but her husband does think that she is dehydrated.  She has  had a fever since being discharged from the hospital, this was yesterday and her TMAX was 101.6. She did have bronchitis symptoms at discharge on 9/22, and CT did show bronchial thickening but no pneumonia.  She continues to have nasal drainage, cough (particularly worsens when lying), shortness of breath.  She denies chest pain or pleurisy.  She is also having difficulty sleeping.  She is taking Ambien, Clonazepam, Risperdal, Seroquel, Lorazepam, all together and it doesn't help.  She was only able to sleep after Morphine and Dilaudid.  She describes her exercise as walking up and down stairs and going to the parking lot.  She has 5 children.  8, 9, 10, 16, and 18.  She says she expends a lot of energy working with them.    REVIEW OF SYSTEMS: A 10 point review of systems was conducted and is otherwise negative except for what is noted above.     PAST MEDICAL HISTORY: Past Medical History  Diagnosis Date  . Anemia   . Arthritis   . Bipolar 1 disorder   . Blood transfusion without reported diagnosis 2007    post bleeding childbirth  . Cancer     eval  lt breast cancer  . Heart murmur     as a child-not adult-no cardiac work up  . Diabetes mellitus without complication     gestational    PAST SURGICAL HISTORY: Past Surgical History  Procedure Laterality Date  . Tonsillectomy and adenoidectomy    . Dilation and curettage of uterus      after childbirth  . Multiple tooth extractions      only 5 left  . Breast biopsy Left 04/01/14  . Breast biopsy Right 04/19/14  . Breast lumpectomy with axillary lymph node dissection Bilateral 05/03/14  . Portacath placement Right 06/18/2014    Procedure: INSERTION PORT-A-CATH;  Surgeon: [name], MD;  Location: [facility];  Service: General;  Laterality: Right;    FAMILY HISTORY No family history on file. The patient's father is currently living at age 76. The patient's mother was murdered at age 73 by the patient's mother's  sister. The patient has 2 brothers, one sister. The patient's father hase prostate cancer, diagnosed at age 47. There is no history of breast or ovarian cancer in the family.  GYNECOLOGIC HISTORY:  No LMP recorded. Patient is not currently having periods (Reason: Irregular Periods). Menarche age 49, first live birth age 81, the patient is [place] P5. The patient has not had a period in the last 2 months but tells me she has tested for pregnancy x2 and she is not pregnant.  SOCIAL HISTORY:   the patient tells me she is disabled secondary to her nervous breakdown. She is currently in her third marriage. Her husband, Joniqua Sidle, is retired from [employer]. He is disabled secondary to seizures. The patient's children are [name] and [name], age 26 and 2, [name] age 44,  And [place] and [name] age 22 and 49. In addition a nephew, [name], 59, lives with her family.       ADVANCED DIRECTIVES:  not in place    HEALTH MAINTENANCE: History  Substance Use Topics  . Smoking status: Current Every Day Smoker -- 1.00 packs/day for 32 years    Types: Cigarettes  . Smokeless tobacco: Never  Used  . Alcohol Use: No     Colonoscopy:  PAP:  Bone density:  Lipid panel:  No Known Allergies  Current Outpatient Prescriptions  Medication Sig Dispense Refill  . citalopram (CELEXA) 40 MG tablet Take 40 mg by mouth daily.      . clonazePAM (KLONOPIN) 1 MG tablet daily.      . ferrous sulfate 325 (65 FE) MG tablet Take 1 tablet (325 mg total) by mouth daily with breakfast.  90 tablet  3  . lamoTRIgine (LAMICTAL) 200 MG tablet Take 200 mg by mouth 2 (two) times daily.      Marland Kitchen LORazepam (ATIVAN) 0.5 MG tablet Take 1 tablet (0.5 mg total) by mouth at bedtime as needed (Nausea or vomiting).  30 tablet  0  . prazosin (MINIPRESS) 2 MG capsule       . QUEtiapine (SEROQUEL) 200 MG tablet Take 1 tablet (200 mg total) by mouth 2 (two) times daily.  60 tablet  5  . risperiDONE (RISPERDAL) 0.25 MG  tablet Take by mouth 2 (two) times daily. Unsure of dose. Given by mental health.      . zolpidem (AMBIEN) 10 MG tablet daily.      Marland Kitchen albuterol (PROVENTIL HFA;VENTOLIN HFA) 108 (90 BASE) MCG/ACT inhaler Inhale 2 puffs into the lungs every 6 (six) hours as needed for wheezing or shortness of breath.  1 Inhaler  2  . azithromycin (ZITHROMAX) 250 MG tablet Take two tabs on day 1, then one tab daily until complete  6 each  0  . dexamethasone (DECADRON) 4 MG tablet Take 2 tablets (8 mg total) by mouth 2 (two) times daily. Start the day before Taxotere. Then again the day after chemo for 3 days.  30 tablet  1  . lidocaine-prilocaine (EMLA) cream Apply 1 application topically as needed. Apply to port area 1-2 hours before chemotherapy, cover with plastic wrap  30 g  0  . naproxen (NAPROSYN) 500 MG tablet Take 1 tablet (500 mg total) by mouth 2 (two) times daily with a meal.  30 tablet  0  . ondansetron (ZOFRAN) 8 MG tablet Take 1 tablet (8 mg total) by mouth 2 (two) times daily. Start the day after chemo for 3 days. Then take as needed for nausea or vomiting.  30 tablet  1  . oxyCODONE-acetaminophen (PERCOCET) 10-325 MG per tablet Take 1 tablet by mouth every 6 (six) hours as needed for pain.  20 tablet  0  . prochlorperazine (COMPAZINE) 10 MG tablet Take 1 tablet (10 mg total) by mouth every 6 (six) hours as needed (Nausea or vomiting).  30 tablet  1   No current facility-administered medications for this visit.    OBJECTIVE: middle-aged [place] woman who appears stated age 43 Vitals:   07/16/14 1143  BP: 134/94  Pulse: 113  Temp: 98.3 F (36.8 C)  Resp: 18     Body mass index is 40.99 kg/(m^2).    ECOG FS:1 - Symptomatic but completely ambulatory  GENERAL: Patient is a well appearing female in no acute distress HEENT:  Sclerae anicteric.  Oropharynx clear and moist. No ulcerations or evidence of oropharyngeal candidiasis. Neck is supple.  NODES:  No cervical, supraclavicular, or axillary  lymphadenopathy palpated.  BREAST EXAM:  Deferred. LUNGS: mild wheezing, no accessory muscle use, no tachypnea HEART:  Regular rate and rhythm. No murmur appreciated. ABDOMEN:  Soft, nontender.  Positive, normoactive bowel sounds. No organomegaly palpated. MSK:  No focal spinal tenderness to palpation. Full  range of motion bilaterally in the upper extremities. EXTREMITIES:  No peripheral edema.   SKIN:  Clear with no obvious rashes or skin changes. No nail dyscrasia. NEURO:  Nonfocal. Well oriented.  Appropriate affect.      LAB RESULTS:  CMP     Component Value Date/Time   NA 139 07/16/2014 1119   NA 140 07/13/2014 0543   K 4.1 07/16/2014 1119   K 4.0 07/13/2014 0543   CL 104 07/13/2014 0543   CO2 23 07/16/2014 1119   CO2 25 07/13/2014 0543   GLUCOSE 98 07/16/2014 1119   GLUCOSE 84 07/13/2014 0543   BUN 12.0 07/16/2014 1119   BUN 5* 07/13/2014 0543   CREATININE 1.1 07/16/2014 1119   CREATININE 0.94 07/13/2014 0543   CREATININE 0.94 02/25/2014 0926   CALCIUM 10.1 07/16/2014 1119   CALCIUM 8.7 07/13/2014 0543   PROT 7.7 07/16/2014 1119   PROT 7.3 07/10/2014 1850   ALBUMIN 3.9 07/16/2014 1119   ALBUMIN 3.8 07/10/2014 1850   AST 16 07/16/2014 1119   AST 17 07/10/2014 1850   ALT 11 07/16/2014 1119   ALT 18 07/10/2014 1850   ALKPHOS 86 07/16/2014 1119   ALKPHOS 103 07/10/2014 1850   BILITOT 0.20 07/16/2014 1119   BILITOT 0.4 07/10/2014 1850   GFRNONAA 74* 07/13/2014 0543   GFRAA 86* 07/13/2014 0543    I No results found for this basename: SPEP,  UPEP,   kappa and lambda light chains    Lab Results  Component Value Date   WBC 19.6* 07/16/2014   NEUTROABS 13.7* 07/16/2014   HGB 14.1 07/16/2014   HCT 42.0 07/16/2014   MCV 77.5* 07/16/2014   PLT 210 07/16/2014      Chemistry      Component Value Date/Time   NA 139 07/16/2014 1119   NA 140 07/13/2014 0543   K 4.1 07/16/2014 1119   K 4.0 07/13/2014 0543   CL 104 07/13/2014 0543   CO2 23 07/16/2014 1119   CO2 25 07/13/2014 0543   BUN 12.0  07/16/2014 1119   BUN 5* 07/13/2014 0543   CREATININE 1.1 07/16/2014 1119   CREATININE 0.94 07/13/2014 0543   CREATININE 0.94 02/25/2014 0926      Component Value Date/Time   CALCIUM 10.1 07/16/2014 1119   CALCIUM 8.7 07/13/2014 0543   ALKPHOS 86 07/16/2014 1119   ALKPHOS 103 07/10/2014 1850   AST 16 07/16/2014 1119   AST 17 07/10/2014 1850   ALT 11 07/16/2014 1119   ALT 18 07/10/2014 1850   BILITOT 0.20 07/16/2014 1119   BILITOT 0.4 07/10/2014 1850       Lab Results  Component Value Date   LABCA2 28 04/30/2014    No components found with this basename: [id number]    No results found for this basename: INR,  in the last 168 hours  Urinalysis    Component Value Date/Time   COLORURINE YELLOW 07/10/2014 [place] 07/10/2014 1951   LABSPEC 1.017 07/10/2014 1951   PHURINE 6.0 07/10/2014 1951   GLUCOSEU NEGATIVE 07/10/2014 1951   HGBUR NEGATIVE 07/10/2014 1951   BILIRUBINUR NEGATIVE 07/10/2014 1951   KETONESUR NEGATIVE 07/10/2014 1951   PROTEINUR NEGATIVE 07/10/2014 1951   UROBILINOGEN 0.2 07/10/2014 1951   NITRITE NEGATIVE 07/10/2014 1951   LEUKOCYTESUR TRACE* 07/10/2014 1951    STUDIES: Dg Chest Port 1 View  06/18/2014   CLINICAL DATA:  Status post port placement.  EXAM: PORTABLE CHEST - 1 VIEW  COMPARISON:  11/23/2010.  FINDINGS: Right anterior chest wall Port-A-Cath has been placed. The tip lies in the lower superior vena cava. There is no pneumothorax.  There is hazy airspace opacity in the central upper lobes and medial lung bases. This suggests mild pulmonary edema. No pleural effusion is evident. Cardiac silhouette is normal in size.  IMPRESSION: No pneumothorax following Port-A-Cath placement. Catheter tip lies in the lower superior vena cava.  Central lung airspace opacity suggests mild pulmonary edema. These results were called by telephone at the time of interpretation on 06/18/2014 at 10:05 am to the patient's nurse, who verbally acknowledged these results.   Electronically  Signed   By: Lajean Manes M.D.   On: 06/18/2014 10:05   Dg Fluoro Guide Cv Line-no Report  06/18/2014   CLINICAL DATA: Port Insertion   FLOURO GUIDE CV LINE  Fluoroscopy was utilized by the requesting physician.  No radiographic  interpretation.    ASSESSMENT: 43 y.o. BRCA negative [place] woman status post right lumpectomy and sentinel lymph node sampling 05/03/2014 for a pT1c pN0, stage IA invasive ductal carcinoma, grade 2, estrogen receptor 83% positive, progesterone receptor 66% positive, with an MIB-1 of 14% and no HER-2 amplification.  (1) Oncotype score of 22 predicts a risk of outside the breast recurrence within 10 years of 13% if the patient's only systemic therapy is tamoxifen for 5 years.  (2) adjuvant chemotherapy with cyclophosphamide and docetaxel to start 07/06/2014  (3) adjuvant radiation to follow chemotherapy  (4) adjuvant antiestrogen therapy for 5-10 years to follow radiation  PLAN:  [name] has had a rather difficult time with chemotherapy.  She was recently admitted to the hospital with likely pain related to the Neulasta.  I reviewed this patient in detail with Dr. Jana Hakim and formulated the plan as follows with his recommendation.    1.  Patient will not receive any more adjuvant chemotherapy.  Her oncotype score is 22 and she has stage I disease.  The patient was not happy when I told her this, and we agreed to discuss it further at her next appointment.    2.  I prescribed a Zpak for her upper respiratory infection per Dr. Jana Hakim.  I also gave her an inhaler for her wheezing and instructed her to use it four times a day to help clear her airways.    3.  Due to her diarrhea and subsequent dehydration she will receive a liter of IV fluids in the infusion room.  I also recommended that she take Imodium PO as directed on the bottle for diarrhea.  If that doesn't work, she should call us and I will prescribe Questran BID.    [name] did request morphine or  dilaudid to help her sleep.  I told her based on all of the medications she takes and cannot sleep, I'm not sure what else is going to help her.  She will f/u with her PCP about this on Monday.  We will see her back next week.    I gave [name] detailed information in her AVS about the new medications.  The patient has a good understanding of the overall plan. She agrees with it. She knows the goal of treatment in her case is cure. She will call with any problems that may develop before her next visit here.  I spent 40 minutes counseling the patient face to face.  The total time spent in the appointment was 50 minutes.   Minette Headland, [place] [phone]  07/18/2014 8:30 AM

## 2014-07-16 NOTE — Patient Instructions (Signed)
Loperamide tablets or capsules What is this medicine? LOPERAMIDE (loe PER a mide) is used to treat diarrhea. This medicine may be used for other purposes; ask your health care provider or pharmacist if you have questions. COMMON BRAND NAME(S): Anti-Diarrheal, Imodium A-D, K-Pek II What should I tell my health care provider before I take this medicine? They need to know if you have any of these conditions: -a black or bloody stool -bacterial food poisoning -colitis or mucus in your stool -currently taking an antibiotic medication for an infection -fever -liver disease -severe abdominal pain, swelling or bulging -an unusual or allergic reaction to loperamide, other medicines, foods, dyes, or preservatives -pregnant or trying to get pregnant -breast-feeding How should I use this medicine? Take this medicine by mouth with a glass of water. Follow the directions on the prescription label. Take your doses at regular intervals. Do not take your medicine more often than directed. Talk to your pediatrician regarding the use of this medicine in children. Special care may be needed. Overdosage: If you think you have taken too much of this medicine contact a poison control center or emergency room at once. NOTE: This medicine is only for you. Do not share this medicine with others. What if I miss a dose? This does not apply. This medicine is not for regular use. Only take this medicine while you continue to have loose bowel movements. Do not take more medicine than recommended by the packaging label or by your healthcare professional. What may interact with this medicine? Do not take this medicine with any of the following medications: -alosetron This medicine may also interact with the following medications: -quinidine -ritonavir -saquinavir This list may not describe all possible interactions. Give your health care provider a list of all the medicines, herbs, non-prescription drugs, or dietary  supplements you use. Also tell them if you smoke, drink alcohol, or use illegal drugs. Some items may interact with your medicine. What should I watch for while using this medicine? Do not take this medicine for more than 1 week without asking your doctor or health care professional. If your symptoms do not start to get better after two days, you may have a problem that needs further evaluation. Check with your doctor or health care professional right away if you develop a fever, severe abdominal pain, swelling or bulging, or if you have have bloody/black diarrhea or stools. You may get drowsy or dizzy. Do not drive, use machinery, or do anything that needs mental alertness until you know how this medicine affects you. Do not stand or sit up quickly, especially if you are an older patient. This reduces the risk of dizzy or fainting spells. Alcohol can increase possible drowsiness and dizziness. Avoid alcoholic drinks. Your mouth may get dry. Chewing sugarless gum or sucking hard candy, and drinking plenty of water may help. Contact your doctor if the problem does not go away or is severe. Drinking plenty of water can also help prevent dehydration that can occur with diarrhea. Elderly patients may have a more variable response to the effects of this medicine, and are more susceptible to the effects of dehydration. What side effects may I notice from receiving this medicine? Side effects that you should report to your doctor or health care professional as soon as possible: -allergic reactions like skin rash, itching or hives, swelling of the face, lips, or tongue -bloated, swollen feeling in your abdomen -blurred vision -loss of appetite -stomach pain Side effects that usually do not  require medical attention (report to your doctor or health care professional if they continue or are bothersome): -constipation -drowsiness or dizziness -dry mouth -nausea, vomiting This list may not describe all  possible side effects. Call your doctor for medical advice about side effects. You may report side effects to FDA at 1-800-FDA-1088. Where should I keep my medicine? Keep out of the reach of children. Store at room temperature between 15 and 25 degrees C (59 and 77 degrees F). Keep container tightly closed. Throw away any unused medicine after the expiration date. NOTE: This sheet is a summary. It may not cover all possible information. If you have questions about this medicine, talk to your doctor, pharmacist, or health care provider.  2015, [employer]. (2008-04-13 16:02:13) Albuterol inhalation aerosol What is this medicine? ALBUTEROL (al Norm[place]ean [place]) is a bronchodilator. It helps open up the airways in your lungs to make it easier to breathe. This medicine is used to treat and to prevent bronchospasm. This medicine may be used for other purposes; ask your health care provider or pharmacist if you have questions. COMMON BRAND NAME(S): Proair HFA, Proventil, Proventil HFA, Respirol, Ventolin, Ventolin HFA What should I tell my health care provider before I take this medicine? They need to know if you have any of the following conditions: -diabetes -heart disease or irregular heartbeat -high blood pressure -pheochromocytoma -seizures -thyroid disease -an unusual or allergic reaction to albuterol, levalbuterol, sulfites, other medicines, foods, dyes, or preservatives -pregnant or trying to get pregnant -breast-feeding How should I use this medicine? This medicine is for inhalation through the mouth. Follow the directions on your prescription label. Take your medicine at regular intervals. Do not use more often than directed. Make sure that you are using your inhaler correctly. Ask you doctor or health care provider if you have any questions. Talk to your pediatrician regarding the use of this medicine in children. Special care may be needed. Overdosage: If you think you have  taken too much of this medicine contact a poison control center or emergency room at once. NOTE: This medicine is only for you. Do not share this medicine with others. What if I miss a dose? If you miss a dose, use it as soon as you can. If it is almost time for your next dose, use only that dose. Do not use double or extra doses. What may interact with this medicine? -anti-infectives like chloroquine and pentamidine -caffeine -cisapride -diuretics -medicines for colds -medicines for depression or for emotional or psychotic conditions -medicines for weight loss including some herbal products -methadone -some antibiotics like clarithromycin, erythromycin, levofloxacin, and linezolid -some heart medicines -steroid hormones like dexamethasone, cortisone, hydrocortisone -theophylline -thyroid hormones This list may not describe all possible interactions. Give your health care provider a list of all the medicines, herbs, non-prescription drugs, or dietary supplements you use. Also tell them if you smoke, drink alcohol, or use illegal drugs. Some items may interact with your medicine. What should I watch for while using this medicine? Tell your doctor or health care professional if your symptoms do not improve. Do not use extra albuterol. If your asthma or bronchitis gets worse while you are using this medicine, call your doctor right away. If your mouth gets dry try chewing sugarless gum or sucking hard candy. Drink water as directed. What side effects may I notice from receiving this medicine? Side effects that you should report to your doctor or health care professional as soon as possible: -allergic reactions like  skin rash, itching or hives, swelling of the face, lips, or tongue -breathing problems -chest pain -feeling faint or lightheaded, falls -high blood pressure -irregular heartbeat -fever -muscle cramps or weakness -pain, tingling, numbness in the hands or feet -vomiting Side  effects that usually do not require medical attention (report to your doctor or health care professional if they continue or are bothersome): -cough -difficulty sleeping -headache -nervousness or trembling -stomach upset -stuffy or runny nose -throat irritation -unusual taste This list may not describe all possible side effects. Call your doctor for medical advice about side effects. You may report side effects to FDA at 1-800-FDA-1088. Where should I keep my medicine? Keep out of the reach of children. Store at room temperature between 15 and 30 degrees C (59 and 86 degrees F). The contents are under pressure and may burst when exposed to heat or flame. Do not freeze. This medicine does not work as well if it is too cold. Throw away any unused medicine after the expiration date. Inhalers need to be thrown away after the labeled number of puffs have been used or by the expiration date; whichever comes first. Ventolin HFA should be thrown away 12 months after removing from foil pouch. Check the instructions that come with your medicine. NOTE: This sheet is a summary. It may not cover all possible information. If you have questions about this medicine, talk to your doctor, pharmacist, or health care provider.  2015, [employer]. (2013-03-26 10:57:17) Azithromycin tablets What is this medicine? AZITHROMYCIN (az ith roe MYE sin) is a macrolide antibiotic. It is used to treat or prevent certain kinds of bacterial infections. It will not work for colds, flu, or other viral infections. This medicine may be used for other purposes; ask your health care provider or pharmacist if you have questions. COMMON BRAND NAME(S): Zithromax, Zithromax Tri-Pak, Zithromax Z-Pak What should I tell my health care provider before I take this medicine? They need to know if you have any of these conditions: -kidney disease -liver disease -irregular heartbeat or heart disease -an unusual or allergic  reaction to azithromycin, erythromycin, other macrolide antibiotics, foods, dyes, or preservatives -pregnant or trying to get pregnant -breast-feeding How should I use this medicine? Take this medicine by mouth with a full glass of water. Follow the directions on the prescription label. The tablets can be taken with food or on an empty stomach. If the medicine upsets your stomach, take it with food. Take your medicine at regular intervals. Do not take your medicine more often than directed. Take all of your medicine as directed even if you think your are better. Do not skip doses or stop your medicine early. Talk to your pediatrician regarding the use of this medicine in children. Special care may be needed. Overdosage: If you think you have taken too much of this medicine contact a poison control center or emergency room at once. NOTE: This medicine is only for you. Do not share this medicine with others. What if I miss a dose? If you miss a dose, take it as soon as you can. If it is almost time for your next dose, take only that dose. Do not take double or extra doses. What may interact with this medicine? Do not take this medicine with any of the following medications: -lincomycin This medicine may also interact with the following medications: -amiodarone -antacids -birth control pills -cyclosporine -digoxin -magnesium -nelfinavir -phenytoin -warfarin This list may not describe all possible interactions. Give your health care  provider a list of all the medicines, herbs, non-prescription drugs, or dietary supplements you use. Also tell them if you smoke, drink alcohol, or use illegal drugs. Some items may interact with your medicine. What should I watch for while using this medicine? Tell your doctor or health care professional if your symptoms do not improve. Do not treat diarrhea with over the counter products. Contact your doctor if you have diarrhea that lasts more than 2 days or if it  is severe and watery. This medicine can make you more sensitive to the sun. Keep out of the sun. If you cannot avoid being in the sun, wear protective clothing and use sunscreen. Do not use sun lamps or tanning beds/booths. What side effects may I notice from receiving this medicine? Side effects that you should report to your doctor or health care professional as soon as possible: -allergic reactions like skin rash, itching or hives, swelling of the face, lips, or tongue -confusion, nightmares or hallucinations -dark urine -difficulty breathing -hearing loss -irregular heartbeat or chest pain -pain or difficulty passing urine -redness, blistering, peeling or loosening of the skin, including inside the mouth -white patches or sores in the mouth -yellowing of the eyes or skin Side effects that usually do not require medical attention (report to your doctor or health care professional if they continue or are bothersome): -diarrhea -dizziness, drowsiness -headache -stomach upset or vomiting -tooth discoloration -vaginal irritation This list may not describe all possible side effects. Call your doctor for medical advice about side effects. You may report side effects to FDA at 1-800-FDA-1088. Where should I keep my medicine? Keep out of the reach of children. Store at room temperature between 15 and 30 degrees C (59 and 86 degrees F). Throw away any unused medicine after the expiration date. NOTE: This sheet is a summary. It may not cover all possible information. If you have questions about this medicine, talk to your doctor, pharmacist, or health care provider.  2015, [employer]. (2013-05-14 15:38:48)

## 2014-07-17 LAB — CULTURE, BLOOD (ROUTINE X 2)
Culture: NO GROWTH
Culture: NO GROWTH

## 2014-07-19 ENCOUNTER — Inpatient Hospital Stay: Payer: Medicare Other | Admitting: Family Medicine

## 2014-07-20 ENCOUNTER — Ambulatory Visit: Payer: Medicare Other

## 2014-07-20 ENCOUNTER — Other Ambulatory Visit: Payer: Medicare Other

## 2014-07-23 ENCOUNTER — Telehealth: Payer: Self-pay | Admitting: Oncology

## 2014-07-23 ENCOUNTER — Encounter: Payer: Self-pay | Admitting: Adult Health

## 2014-07-23 ENCOUNTER — Ambulatory Visit (HOSPITAL_BASED_OUTPATIENT_CLINIC_OR_DEPARTMENT_OTHER): Payer: Medicare Other | Admitting: Adult Health

## 2014-07-23 VITALS — BP 135/71 | HR 125 | Temp 99.4°F | Resp 19 | Ht 68.0 in | Wt 274.8 lb

## 2014-07-23 DIAGNOSIS — C50412 Malignant neoplasm of upper-outer quadrant of left female breast: Secondary | ICD-10-CM

## 2014-07-23 DIAGNOSIS — Z170 Estrogen receptor positive status [ER+]: Secondary | ICD-10-CM

## 2014-07-23 NOTE — Telephone Encounter (Signed)
per [facility] to sch pt appt-referral to Dr Gari Crown has been sch

## 2014-07-23 NOTE — Patient Instructions (Signed)

## 2014-07-23 NOTE — Progress Notes (Signed)
[place]  [phone] Fax:([phone]) [phone]     ID: Kimberly [name] DOB: Dec 31, 1970  MR#: [id number]  VCB#:[id number]  Patient Care Team: [name], DO as PCP - General [name], MD as Consulting Physician (General Surgery) [name], MD as Consulting Physician (Oncology) [name], MD as Attending Physician (Radiation Oncology)  CHIEF COMPLAINT: Estrogen receptor positive breast cancer  CURRENT TREATMENT: Adjuvant chemotherapy   BREAST CANCER HISTORY: From the original intake note:  The patient had bilateral screening mammography with tomography 03/19/2014. This was the patient's first ever mammography. Showed a possible mass in the left breast. Left diagnostic mammography and ultrasonography 04/01/2014 showed an irregular mass in the upper outer quadrant of the left breast with a possible satellite 1 cm anterior to it. On physical exam there was a firm palpable mass at the 1:00 position of the left breast 8 cm from the nipple. There was no palpable left axillary adenopathy. Ultrasound showed an irregular hypoechoic mass in the area in question measuring 1.4 cm. There was a 4 mm nodule located anterior to this. Ultrasound of the left axilla was benign.  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 The second mass was negative for malignancy. This was felt to be concordant.  On 04/08/2014 the patient underwent bilateral breast MRI. This showed a 9 mm enhancing mass in the subareolar area of the right breast in in the left breast, the previously noted mass measuring 1.3 cm. There were no morphologically abnormal lymph nodes  The right  breast finding was followed up on 04/19/2014 with ultrasound which showed an intraductal soft tissue mass in the inferior subareolar worsening of the right breast measuring 8 mm. This was biopsied 04/19/2014 and showed ([id number]) ectatic duct, with no evidence of malignancy. Surgical excision was recommended.  Accordingly on 05/03/2014 the patient underwent right lumpectomy, showing an intraductal papilloma, with known malignancy identified. Left lumpectomy on the same day showed an invasive ductal carcinoma measuring 1.5 cm, with one of the 2 sentinel lymph nodes positive for carcinoma. There was no extracapsular extension. Margins were clear and ample. HER-2 was repeated and was again negative.  The patient's case was discussed in the multidisciplinary breast cancer conference 05/12/2014. An Oncotype had been previously requested and this showed a recurrent score of 22, in the intermediate range. The patient qualifies for the S1007 study and this will be discussed with her. Otherwise the standard recommendation would be chemotherapy followed by radiation followed by anti-estrogens.  The patient's subsequent history is as detailed below.  INTERVAL HISTORY: Kimberly [name] is here with her husband [name] for evaluation.  She is following up since being seen last week after hospitalization.  Due to the complications from chemotherapy, she will not receive any further treatment.  She is in agreement with this.  She was prescribed an inhaler and a Zpak and given IV fluids.  She says she feels much improved.  She didn't go to her hospital f/u with her PCP because she says she feels much improved.  She does feel mildly constipated but says her last She denies any further fevers, cough, nasal drainage, diarrhea, or any further concerns.    REVIEW OF SYSTEMS: A  10 point review of systems was conducted and is otherwise negative except for what is noted above.     PAST MEDICAL HISTORY: Past Medical History    Diagnosis Date  . Anemia   . Arthritis   . Bipolar 1 disorder   . Blood transfusion without reported diagnosis 2007    post bleeding childbirth  . Cancer     eval  lt breast cancer  . Heart murmur     as a child-not adult-no cardiac work up  . Diabetes mellitus without complication     gestational    PAST SURGICAL HISTORY: Past Surgical History  Procedure Laterality Date  . Tonsillectomy and adenoidectomy    . Dilation and curettage of uterus      after childbirth  . Multiple tooth extractions      only 5 left  . Breast biopsy Left 04/01/14  . Breast biopsy Right 04/19/14  . Breast lumpectomy with axillary lymph node dissection Bilateral 05/03/14  . Portacath placement Right 06/18/2014    Procedure: INSERTION PORT-A-CATH;  Surgeon: [name], MD;  Location: [facility];  Service: General;  Laterality: Right;    FAMILY HISTORY No family history on file. The patient's father is currently living at age 42. The patient's mother was murdered at age 42 by the patient's mother's sister. The patient has 2 brothers, one sister. The patient's father hase prostate cancer, diagnosed at age 42. There is no history of breast or ovarian cancer in the family.  GYNECOLOGIC HISTORY:  No LMP recorded. Patient is not currently having periods (Reason: Irregular Periods). Menarche age 8, first live birth age 43, the patient is [place] P5. The patient has not had a period in the last 2 months but tells me she has tested for pregnancy x2 and she is not pregnant.  SOCIAL HISTORY:   the patient tells me she is disabled secondary to her nervous breakdown. She is currently in her third marriage. Her husband, Cheyrl Buley, is retired from [employer]. He is disabled secondary to seizures. The patient's children are Kimberly [name] and Kimberly [name], age 34 and 55, Kimberly [name] age 63,  And Kimberly [name] and Kimberly [name] age 11 and 38. In addition a nephew, [name], 72, lives with her family.        ADVANCED DIRECTIVES:  not in place    HEALTH MAINTENANCE: History  Substance Use Topics  . Smoking status: Current Every Day Smoker -- 1.00 packs/day for 32 years    Types: Cigarettes  . Smokeless tobacco: Never Used  . Alcohol Use: No     Colonoscopy:  PAP:  Bone density:  Lipid panel:  No Known Allergies  Current Outpatient Prescriptions  Medication Sig Dispense Refill  . albuterol (PROVENTIL HFA;VENTOLIN HFA) 108 (90 BASE) MCG/ACT inhaler Inhale 2 puffs into the lungs every 6 (six) hours as needed for wheezing or shortness of breath.  1 Inhaler  2  . citalopram (CELEXA) 40 MG tablet Take 40 mg by mouth daily.      . clonazePAM (KLONOPIN) 1 MG tablet daily.      Marland Kitchen dexamethasone (DECADRON) 4 MG tablet Take 2 tablets (8 mg total) by mouth 2 (two) times daily. Start the day before Taxotere. Then again the day after chemo for 3 days.  30 tablet  1  . ferrous sulfate 325 (65 FE) MG tablet Take 1 tablet (325 mg total) by mouth daily with breakfast.  90 tablet  3  . lamoTRIgine (LAMICTAL) 200 MG tablet  Take 200 mg by mouth 2 (two) times daily.      . prazosin (MINIPRESS) 2 MG capsule       . QUEtiapine (SEROQUEL) 200 MG tablet Take 1 tablet (200 mg total) by mouth 2 (two) times daily.  60 tablet  5  . risperiDONE (RISPERDAL) 0.25 MG tablet Take by mouth 2 (two) times daily. Unsure of dose. Given by mental health.      . lidocaine-prilocaine (EMLA) cream Apply 1 application topically as needed. Apply to port area 1-2 hours before chemotherapy, cover with plastic wrap  30 g  0  . LORazepam (ATIVAN) 0.5 MG tablet Take 1 tablet (0.5 mg total) by mouth at bedtime as needed (Nausea or vomiting).  30 tablet  0  . naproxen (NAPROSYN) 500 MG tablet Take 1 tablet (500 mg total) by mouth 2 (two) times daily with a meal.  30 tablet  0  . ondansetron (ZOFRAN) 8 MG tablet Take 1 tablet (8 mg total) by mouth 2 (two) times daily. Start the day after chemo for 3 days. Then take as needed for  nausea or vomiting.  30 tablet  1  . oxyCODONE-acetaminophen (PERCOCET) 10-325 MG per tablet Take 1 tablet by mouth every 6 (six) hours as needed for pain.  20 tablet  0  . prochlorperazine (COMPAZINE) 10 MG tablet Take 1 tablet (10 mg total) by mouth every 6 (six) hours as needed (Nausea or vomiting).  30 tablet  1   No current facility-administered medications for this visit.    OBJECTIVE: middle-aged [place] woman who appears stated age 43 Vitals:   07/23/14 0858  BP: 135/71  Pulse: 125  Temp: 99.4 F (37.4 C)  Resp: 19     Body mass index is 41.79 kg/(m^2).    ECOG FS:1 - Symptomatic but completely ambulatory  GENERAL: Patient is a well appearing female in no acute distress HEENT:  Sclerae anicteric.  Oropharynx clear and moist. No ulcerations or evidence of oropharyngeal candidiasis. Neck is supple.  NODES:  No cervical, supraclavicular, or axillary lymphadenopathy palpated.  BREAST EXAM:  Deferred. LUNGS: mild  And improved wheezing, no accessory muscle use, no tachypnea HEART:  Regular rate and rhythm. No murmur appreciated. ABDOMEN:  Soft, nontender.  Positive, normoactive bowel sounds. No organomegaly palpated. MSK:  No focal spinal tenderness to palpation. Full range of motion bilaterally in the upper extremities. EXTREMITIES:  No peripheral edema.   SKIN:  Clear with no obvious rashes or skin changes. No nail dyscrasia. NEURO:  Nonfocal. Well oriented.  Appropriate affect.    LAB RESULTS:  CMP     Component Value Date/Time   NA 139 07/16/2014 1119   NA 140 07/13/2014 0543   K 4.1 07/16/2014 1119   K 4.0 07/13/2014 0543   CL 104 07/13/2014 0543   CO2 23 07/16/2014 1119   CO2 25 07/13/2014 0543   GLUCOSE 98 07/16/2014 1119   GLUCOSE 84 07/13/2014 0543   BUN 12.0 07/16/2014 1119   BUN 5* 07/13/2014 0543   CREATININE 1.1 07/16/2014 1119   CREATININE 0.94 07/13/2014 0543   CREATININE 0.94 02/25/2014 0926   CALCIUM 10.1 07/16/2014 1119   CALCIUM 8.7 07/13/2014 0543   PROT  7.7 07/16/2014 1119   PROT 7.3 07/10/2014 1850   ALBUMIN 3.9 07/16/2014 1119   ALBUMIN 3.8 07/10/2014 1850   AST 16 07/16/2014 1119   AST 17 07/10/2014 1850   ALT 11 07/16/2014 1119   ALT 18 07/10/2014 1850   ALKPHOS 86  07/16/2014 1119   ALKPHOS 103 07/10/2014 1850   BILITOT 0.20 07/16/2014 1119   BILITOT 0.4 07/10/2014 1850   GFRNONAA 74* 07/13/2014 0543   GFRAA 86* 07/13/2014 0543    I No results found for this basename: SPEP,  UPEP,   kappa and lambda light chains    Lab Results  Component Value Date   WBC 19.6* 07/16/2014   NEUTROABS 13.7* 07/16/2014   HGB 14.1 07/16/2014   HCT 42.0 07/16/2014   MCV 77.5* 07/16/2014   PLT 210 07/16/2014      Chemistry      Component Value Date/Time   NA 139 07/16/2014 1119   NA 140 07/13/2014 0543   K 4.1 07/16/2014 1119   K 4.0 07/13/2014 0543   CL 104 07/13/2014 0543   CO2 23 07/16/2014 1119   CO2 25 07/13/2014 0543   BUN 12.0 07/16/2014 1119   BUN 5* 07/13/2014 0543   CREATININE 1.1 07/16/2014 1119   CREATININE 0.94 07/13/2014 0543   CREATININE 0.94 02/25/2014 0926      Component Value Date/Time   CALCIUM 10.1 07/16/2014 1119   CALCIUM 8.7 07/13/2014 0543   ALKPHOS 86 07/16/2014 1119   ALKPHOS 103 07/10/2014 1850   AST 16 07/16/2014 1119   AST 17 07/10/2014 1850   ALT 11 07/16/2014 1119   ALT 18 07/10/2014 1850   BILITOT 0.20 07/16/2014 1119   BILITOT 0.4 07/10/2014 1850       Lab Results  Component Value Date   LABCA2 28 04/30/2014    No components found with this basename: [id number]    No results found for this basename: INR,  in the last 168 hours  Urinalysis    Component Value Date/Time   COLORURINE YELLOW 07/10/2014 [place] 07/10/2014 1951   LABSPEC 1.017 07/10/2014 1951   PHURINE 6.0 07/10/2014 1951   GLUCOSEU NEGATIVE 07/10/2014 1951   HGBUR NEGATIVE 07/10/2014 1951   BILIRUBINUR NEGATIVE 07/10/2014 1951   KETONESUR NEGATIVE 07/10/2014 1951   PROTEINUR NEGATIVE 07/10/2014 1951   UROBILINOGEN 0.2 07/10/2014 1951   NITRITE  NEGATIVE 07/10/2014 1951   LEUKOCYTESUR TRACE* 07/10/2014 1951    STUDIES: Dg Chest Port 1 View  06/18/2014   CLINICAL DATA:  Status post port placement.  EXAM: PORTABLE CHEST - 1 VIEW  COMPARISON:  11/23/2010.  FINDINGS: Right anterior chest wall Port-A-Cath has been placed. The tip lies in the lower superior vena cava. There is no pneumothorax.  There is hazy airspace opacity in the central upper lobes and medial lung bases. This suggests mild pulmonary edema. No pleural effusion is evident. Cardiac silhouette is normal in size.  IMPRESSION: No pneumothorax following Port-A-Cath placement. Catheter tip lies in the lower superior vena cava.  Central lung airspace opacity suggests mild pulmonary edema. These results were called by telephone at the time of interpretation on 06/18/2014 at 10:05 am to the patient's nurse, who verbally acknowledged these results.   Electronically Signed   By: Lajean Manes M.D.   On: 06/18/2014 10:05   Dg Fluoro Guide Cv Line-no Report  06/18/2014   CLINICAL DATA: Port Insertion   FLOURO GUIDE CV LINE  Fluoroscopy was utilized by the requesting physician.  No radiographic  interpretation.    ASSESSMENT: 43 y.o. BRCA negative [place] woman status post right lumpectomy and sentinel lymph node sampling 05/03/2014 for a pT1c pN0, stage IA invasive ductal carcinoma, grade 2, estrogen receptor 83% positive, progesterone receptor 66% positive, with an MIB-1 of 14% and  no HER-2 amplification.  (1) Oncotype score of 22 predicts a risk of outside the breast recurrence within 10 years of 13% if the patient's only systemic therapy is tamoxifen for 5 years.  (2) adjuvant chemotherapy with cyclophosphamide and docetaxel started 07/06/2014, patient tolerated chemotherapy very poorly and it was discontinued after one cycle.    (3) adjuvant radiation to follow chemotherapy  (4) adjuvant antiestrogen therapy for 5-10 years to follow radiation  PLAN:  [place] is doing much better  since her last appointment.  I again reviewed with her that she will receive no further chemotherapy per Dr. Jana Hakim. She will now go onto radiation therapy.  I have re-referred her to Dr. Isidore Moos.  She will return in 2 months for evaluation and f/u with Dr. Jana Hakim regarding anti-estrogen therapy.  She will likely take Tamoxifen, and I gave her information about this in her AVS.  She did request percocet today, but has not cancer related pain, just joint aches.  She last received this from Dr. Donne Hazel following surgery.  I told her to follow up with him if she has any post surgery reason for needing it.  She will return in 6 weeks for port flush, and 2 months for f/u.    The patient has a good understanding of the overall plan. She agrees with it. She knows the goal of treatment in her case is cure. She will call with any problems that may develop before her next visit here.  I spent 25 minutes counseling the patient face to face.  The total time spent in the appointment was 30 minutes.   Minette Headland, [place] [phone] 07/23/2014 9:09 AM

## 2014-07-27 ENCOUNTER — Other Ambulatory Visit (INDEPENDENT_AMBULATORY_CARE_PROVIDER_SITE_OTHER): Payer: Self-pay

## 2014-07-27 ENCOUNTER — Ambulatory Visit: Payer: Medicare Other | Admitting: Nurse Practitioner

## 2014-07-27 ENCOUNTER — Other Ambulatory Visit: Payer: Medicare Other

## 2014-07-27 ENCOUNTER — Ambulatory Visit: Payer: Medicare Other

## 2014-07-28 ENCOUNTER — Ambulatory Visit: Payer: Medicare Other

## 2014-07-28 ENCOUNTER — Telehealth: Payer: Self-pay | Admitting: Obstetrics and Gynecology

## 2014-07-28 ENCOUNTER — Telehealth: Payer: Self-pay | Admitting: *Deleted

## 2014-07-28 NOTE — Telephone Encounter (Signed)
Pt returned call. States that wife is having pain unrelieved by Tylenol or Advil.  Was told by Dr. Donne Hazel to call our office about pain (see other phone note from today)  No same days available today, ok with appt in the AM.  Advised I would send MD message since she is in clinic this afternoon. Zniya Cottone, [place] Spotted

## 2014-07-28 NOTE — Telephone Encounter (Signed)
Message left by pt's husband stating " my wife needs refills on her pain medications-please help [name] resolve this- she is using aleve and any OTC meds and they are not working- she is getting no rest ".  Return call number left as [phone].  This RN reviewed chart and noted last entry per visit with LC/NP-   Call returned to Mr Silos and per discussion referred him to contact her PCP due to ongoing issues not relating to current therapy-   Mr Pinkham verbalized understanding.

## 2014-07-28 NOTE — Telephone Encounter (Signed)
Attempted to call back, VM was full and could not accept anymore messages. Stashia Sia, [place] Spotted

## 2014-07-28 NOTE — Telephone Encounter (Signed)
Husband calls, would like to speak to MD/nurse about pt's current pain. Would not go into any details. Pls call [phone].

## 2014-07-29 ENCOUNTER — Ambulatory Visit (INDEPENDENT_AMBULATORY_CARE_PROVIDER_SITE_OTHER): Payer: Medicare Other | Admitting: Family Medicine

## 2014-07-29 ENCOUNTER — Encounter: Payer: Self-pay | Admitting: Family Medicine

## 2014-07-29 ENCOUNTER — Ambulatory Visit: Payer: Medicare Other | Admitting: Family Medicine

## 2014-07-29 VITALS — BP 117/65 | HR 109 | Temp 97.9°F | Wt 278.0 lb

## 2014-07-29 DIAGNOSIS — R52 Pain, unspecified: Secondary | ICD-10-CM | POA: Insufficient documentation

## 2014-07-29 DIAGNOSIS — F1190 Opioid use, unspecified, uncomplicated: Secondary | ICD-10-CM

## 2014-07-29 MED ORDER — OXYCODONE-ACETAMINOPHEN 10-325 MG PO TABS: 1.0000 | ORAL_TABLET | Freq: Three times a day (TID) | ORAL | Status: DC | PRN

## 2014-07-29 NOTE — Progress Notes (Signed)
   Subjective:    Patient ID: Kimberly [name], female    DOB: 1971/08/07, 43 y.o.   MRN: [id number]  HPI 43 year old female with breast cancer (stage IA invasive ductal carcinoma) presents for a same-day appointment with complaints of diffuse muscle and joint pain.   Patient is currently followed by Heme/Onc Dr. Jana Hakim.  She is no longer undergoing chemotherapy secondary to adverse effects/intolerance.  She was recently admitted to the hospital from 9/19 through 9/22 following severe pain that developed after chemotherapy infusion.  Patient reports that she has been experiencing severe pain "all over" for the past 2 weeks.  She is seen oncology as well as her surgeon for this.  She reports that "no one is treating my pain".  Her pain is located diffusely particularly in the back/hip/knees.  She reports that the pain is severe and currently 10 out of 10.  She reports that "my bones ache". She states that she is not currently taking any pain medication and has not been prescribed any recently.  No recent fever, chills, nausea, vomiting. No other complaints at this time.  Review of Systems Per HPI.    Objective:   Physical Exam Filed Vitals:   07/29/14 1020  BP: 117/65  Pulse: 109  Temp: 97.9 F (36.6 C)   Exam: General: appears fatigued; appears older than stated age. NAD. Cardiovascular: Tachycardic. No murmurs, rubs, or gallops. Respiratory: CTAB. No rales, rhonchi, or wheeze. Abdomen: soft, nontender, nondistended. MSK: No appreciable swelling/effusion of the knees, ankles.  No LE swelling.    Assessment & Plan:  See Problem List

## 2014-07-29 NOTE — Progress Notes (Signed)
Kimberly [name] received her 1st cycle of adjuvant chemotherapy with cyclophosphamide and docetaxel started 07/06/2014.  Tolerated chemotherapy very poorly and it was discontinued after one cycle. She was admitted to the hospital on 07/10/14 and was released on 07/13/14. She was dehydrated due to diarrhea and did have a lot of difficulty with pain caused by Neulasta. Chemotherapy discontinued.  To start Radiation Therapy followed by anti-estrogen. No weight loss noted as of 07/29/14.

## 2014-07-29 NOTE — Assessment & Plan Note (Addendum)
Unclear etiology. Prior notes from oncology indicated they do not feel that this is cancer-related pain.  Exam is unremarkable with no significant focal findings to account for patient's pain. However, given patient's underlying breast cancer and recent intolerance and severe reaction to prior chemotherapy I gave her an Rx for Percocet 10-325 mg (#90). I informed her that this will not be chronic as opiates are dangerous and come  with inherent risk when used chronically.  Patient to follow closely with PCP.

## 2014-07-29 NOTE — Patient Instructions (Signed)
It was nice to see you today.  I am giving your Percocet for pain (#90).  Please follow up with your PCP as soon as you can.   For the most part, we like to avoid use of chronic opiates for pain due to its risks.  Further refills, will need to come from your primary physician.  Thersa Salt DO

## 2014-07-30 ENCOUNTER — Ambulatory Visit
Admission: RE | Admit: 2014-07-30 | Discharge: 2014-07-30 | Disposition: A | Discharge status: Home / Self Care | Payer: Medicare Other | Source: Ambulatory Visit | Attending: Radiation Oncology | Admitting: Radiation Oncology

## 2014-07-30 ENCOUNTER — Encounter: Payer: Self-pay | Admitting: Radiation Oncology

## 2014-07-30 VITALS — BP 121/69 | HR 98 | Temp 98.9°F | Resp 20 | Ht 68.0 in | Wt 278.3 lb

## 2014-07-30 DIAGNOSIS — L599 Disorder of the skin and subcutaneous tissue related to radiation, unspecified: Secondary | ICD-10-CM | POA: Diagnosis not present

## 2014-07-30 DIAGNOSIS — Z510 Encounter for antineoplastic radiation therapy: Secondary | ICD-10-CM | POA: Insufficient documentation

## 2014-07-30 DIAGNOSIS — C50412 Malignant neoplasm of upper-outer quadrant of left female breast: Secondary | ICD-10-CM

## 2014-07-30 DIAGNOSIS — Z9221 Personal history of antineoplastic chemotherapy: Secondary | ICD-10-CM | POA: Diagnosis not present

## 2014-07-30 LAB — DRUG SCR UR, PAIN MGMT, REFLEX CONF
Amphetamine Screen, Ur: NEGATIVE
Barbiturate Quant, Ur: NEGATIVE
Cocaine Metabolites: NEGATIVE
Creatinine,U: 94.37 mg/dL
Marijuana Metabolite: NEGATIVE
Methadone: NEGATIVE
Opiates: NEGATIVE
Phencyclidine (PCP): NEGATIVE
Propoxyphene: NEGATIVE

## 2014-07-30 NOTE — Progress Notes (Signed)
Patient states she has generalized pain, particularly a headache that "feels like my head is going to bust". She states she saw a provider yesterday and was prescribed Percocet. She states "it doesn't do any good." She states she requested Morphine and Dilaudid " like I had in the hospital", but she was told to see her PCP for these medications. Pt has not been taking Naproxen, states she will begin today. She has not taken Percocet today, states she has not eaten. She will discuss pain medications with Dr Isidore Moos.

## 2014-07-30 NOTE — Progress Notes (Signed)
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 UOQ    ICD-9-CM ICD-10-CM  1. Breast cancer of upper-outer quadrant of left female breast 174.4 C50.412    Narrative:  The patient returns today for routine follow-up.  She started adjuvant chemotherapy with cyclophosphamide and docetaxel on 07/06/2014 but tolerated chemotherapy very poorly and it was discontinued after one cycle.   She was recently admitted with diffuse body pain/aches. She reports she felt "like a new person" when she received morphine.  She is asking for a Rx for this.  She says that "my head is busting" but that the pain is more in her "hair" and does not feel like her brain.  She denies focal neurologic symptoms, dizziness, coordination issues, or trouble walking.                                ALLERGIES:  has No Known Allergies.  Meds: Current Outpatient Prescriptions  Medication Sig Dispense Refill  . albuterol (PROVENTIL HFA;VENTOLIN HFA) 108 (90 BASE) MCG/ACT inhaler Inhale 2 puffs into the lungs every 6 (six) hours as needed for wheezing or shortness of breath.  1 Inhaler  2  . citalopram (CELEXA) 40 MG tablet Take 40 mg by mouth daily.      . clonazePAM (KLONOPIN) 1 MG tablet daily.      . ferrous sulfate 325 (65 FE) MG tablet Take 1 tablet (325 mg total) by mouth daily with breakfast.  90 tablet  3  . lamoTRIgine (LAMICTAL) 200 MG tablet Take 200 mg by mouth 2 (two) times daily.      Marland Kitchen lidocaine-prilocaine (EMLA) cream       . oxyCODONE-acetaminophen (PERCOCET) 10-325 MG per tablet Take 1 tablet by mouth every 8 (eight) hours as needed for pain.  90 tablet  0  . prazosin (MINIPRESS) 2 MG  capsule       . QUEtiapine (SEROQUEL) 200 MG tablet Take 1 tablet (200 mg total) by mouth 2 (two) times daily.  60 tablet  5  . risperiDONE (RISPERDAL) 0.25 MG tablet Take by mouth 2 (two) times daily. Unsure of dose. Given by mental health.      Marland Kitchen LORazepam (ATIVAN) 0.5 MG tablet       . naproxen (NAPROSYN) 500 MG tablet Take 1 tablet (500 mg total) by mouth 2 (two) times daily with a meal.  30 tablet  0  . ondansetron (ZOFRAN) 8 MG tablet       . prochlorperazine (COMPAZINE) 10 MG tablet        No current facility-administered medications for this encounter.    Physical Findings: The patient is in no acute distress. Patient is alert and oriented.  height is _0  (1.727 m) and weight is 278 lb 4.8 oz (126.236 kg). Her oral temperature is 98.9 F (37.2 C). Her blood pressure is 121/69 and her pulse is 98. Her respiration is 20. .    Breasts: no sign of infection bilaterally.  Scars from surgery well healed.   General: Alert and oriented, in no acute distress HEENT: Head is normocephalic. Extraocular movements are intact.  Oropharynx is clear - poor dentition, however. Musculoskeletal: symmetric strength and muscle tone throughout. Neurologic: Cranial nerves II through XII are grossly intact. No obvious focalities. Speech is fluent. Coordination is intact.   Lab Findings: Lab Results  Component Value Date   WBC 19.6* 07/16/2014   HGB 14.1 07/16/2014   HCT 42.0 07/16/2014   MCV 77.5* 07/16/2014   PLT 210 07/16/2014    Radiographic Findings: Ct Angio Chest W/cm &/or Wo Cm  07/10/2014   CLINICAL DATA:  Breast cancer, on chemotherapy, new onset shortness of breath and body pain.  EXAM: CT ANGIOGRAPHY CHEST WITH CONTRAST  TECHNIQUE: Multidetector CT imaging of the chest was performed using the standard protocol during bolus administration of intravenous contrast. Multiplanar CT image reconstructions and MIPs were obtained to evaluate the vascular anatomy. Due to initial poor bolus timing,  repeat examination performed to without significant image quality improvement.  CONTRAST:  124m OMNIPAQUE IOHEXOL 350 MG/ML SOLN  COMPARISON:  Chest radiograph July 10, 2014  FINDINGS: Mild respiratory motion degraded examination. Sub optimal contrast opacification of the pulmonary artery on initial imaging, repeat examination performed with similar sub optimal pulmonary arterial contrast opacification (160 Hounsfield units on initial examination, 179 Hounsfield units on repeat study). Main pulmonary artery is not enlarged. No commencing evidence of acute pulmonary embolism to at least the segmental branches.  Heart size is normal. No RIGHT heart strain. Thoracic aorta is normal in course and caliber. RIGHT hilar Mild lymphadenopathy measuring up to 13 mm in short axis.  Trace bronchial wall thickening. No pleural effusions or focal consolidation. No pneumothorax.  Included view of the abdomen is nonsuspicious. Complex RIGHT breast mass measures 3.9 x 2.7 cm. Surgical clips in LEFT breast. RIGHT chest Port-A-Cath with distal tip by cavoatrial junction. 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  Review of the MIP images confirms the above findings.  IMPRESSION: Suboptimal pulmonary arterial contrast opacification further degraded by mild respiratory motion. No convincing evidence of acute pulmonary embolism to at least the segmental branches.  Trace bronchial wall thickening. This could reflect reactive airway disease or possible bronchitis without focal consolidation.  Nonspecific RIGHT hilar lymphadenopathy. 3.9 x 2.7 cm RIGHT breast mass, recommend correlation with prior breast imaging. Surgical clips in LEFT breast.   Electronically Signed   By: CElon Alas  On: 07/10/2014 23:53   Dg Chest Port 1 View  (if Code Sepsis Called)  07/10/2014   CLINICAL DATA:  Shortness of breath  EXAM: PORTABLE CHEST - 1 VIEW  COMPARISON:  Chest radiograph 06/18/2014   FINDINGS: Right chest wall Port-A-Cath is present with tip projecting over the superior vena cava. Normal cardiac and mediastinal contours. No consolidative pulmonary opacities. No pleural effusion or pneumothorax left axillary surgical clips.  IMPRESSION: No acute cardiopulmonary process.   Electronically Signed   By: DLovey NewcomerM.D.   On: 07/10/2014 19:30    Impression/Plan:  pT1c pN1a cM0, Stage IIA Left Breast Invasive ductal Carcinoma, Grade II, ER/PR + Her2 negative. UOQ  1) she continues to complain of diffuse body pain. This has been addressed w/ medical oncology and the inpatient team as well as recently with her primary care practitioner. She received a prescription for Percocet this week from her PCP and also has been instructed by medical oncology to take Naprosyn twice a day. She has not been taking Naprosyn twice a day. I instructed her to do this, with meals, and I will contact medical oncology regarding her persistent  body aches or pain. A note from Dr. Jana Hakim while she was inpatient implied that this may be related to her chemotherapy. I told her that I will not be prescribing narcotics for her today.  2) Headaches. It's unclear if she is actually having headaches or some other type of pain. However she describes pain in her hair and her head. She has no neurologic symptoms or signs today otherwise. Will continue to follow. I don't think that a scan of her brain is warranted at this time.  3) she is done with chemotherapy and has plans to take antiestrogen therapy after radiation therapy  4)  We discussed the risks, benefits, and side effects of radiotherapy. We discussed that radiation would take approximately 6-7 weeks to complete. We spoke about acute effects including skin irritation and fatigue as well as much less common late effects including lung and heart irritation. We spoke about the latest technology that is used to minimize the risk of late effects for breast cancer  patients undergoing radiotherapy. No guarantees of treatment were given. The patient is enthusiastic about proceeding with treatment. I look forward to participating in the patient's care. Will order a simulation to take place; my staff will contact her. Consent is signed in her chart.  _____________________________________   [name], MD

## 2014-07-30 NOTE — Progress Notes (Signed)
Please see the Nurse Progress Note in the MD Initial Consult Encounter for this patient. 

## 2014-08-01 LAB — BENZODIAZEPINES (GC/LC/MS), URINE
Alprazolam metabolite (GC/LC/MS), ur confirm: NEGATIVE ng/mL (ref <25)
Clonazepam metabolite (GC/LC/MS), ur confirm: 873 ng/mL — ABNORMAL HIGH (ref <25)
Flurazepam metabolite (GC/LC/MS), ur confirm: NEGATIVE ng/mL (ref <50)
Lorazepam (GC/LC/MS), ur confirm: 144 ng/mL — ABNORMAL HIGH (ref <50)
Midazolam (GC/LC/MS), ur confirm: NEGATIVE ng/mL (ref <50)
Nordiazepam (GC/LC/MS), ur confirm: 542 ng/mL — ABNORMAL HIGH (ref <50)
Oxazepam (GC/LC/MS), ur confirm: 1410 ng/mL — ABNORMAL HIGH (ref <50)
Temazepam (GC/LC/MS), ur confirm: 938 ng/mL — ABNORMAL HIGH (ref <50)
Triazolam metabolite (GC/LC/MS), ur confirm: NEGATIVE ng/mL (ref <50)

## 2014-08-01 LAB — OPIATES/OPIOIDS (LC/MS-MS)
Codeine Urine: NEGATIVE ng/mL (ref <50)
Hydrocodone: NEGATIVE ng/mL (ref <50)
Hydromorphone: NEGATIVE ng/mL (ref <50)
Morphine Urine: NEGATIVE ng/mL (ref <50)
Norhydrocodone, Ur: NEGATIVE ng/mL (ref <50)
Noroxycodone, Ur: NEGATIVE ng/mL (ref <50)
Oxycodone, ur: NEGATIVE ng/mL (ref <50)
Oxymorphone: NEGATIVE ng/mL (ref <50)

## 2014-08-02 ENCOUNTER — Other Ambulatory Visit: Payer: Self-pay | Admitting: Oncology

## 2014-08-02 NOTE — Progress Notes (Unsigned)
[name] correct Nodal stage is N1a-- will correct in future notes

## 2014-08-03 ENCOUNTER — Other Ambulatory Visit: Payer: Medicare Other

## 2014-08-03 ENCOUNTER — Ambulatory Visit: Payer: Medicare Other | Admitting: Adult Health

## 2014-08-05 ENCOUNTER — Other Ambulatory Visit: Payer: Self-pay | Admitting: Oncology

## 2014-08-11 ENCOUNTER — Ambulatory Visit: Payer: Medicare Other | Admitting: Radiation Oncology

## 2014-08-16 ENCOUNTER — Telehealth: Payer: Self-pay | Admitting: *Deleted

## 2014-08-16 NOTE — Telephone Encounter (Signed)
Called pt to f/u with her and assess needs. Pt denies needs or c/o head ache or pain. In her previous visit with Dr. Isidore Moos pt c/o head ache and pain.  Gave pt navigation resources and contact information if questions or concerns arose.

## 2014-08-17 ENCOUNTER — Other Ambulatory Visit: Payer: Medicare Other

## 2014-08-17 ENCOUNTER — Ambulatory Visit: Payer: Medicare Other

## 2014-08-17 ENCOUNTER — Ambulatory Visit: Payer: Medicare Other | Admitting: Nurse Practitioner

## 2014-08-18 ENCOUNTER — Ambulatory Visit: Payer: Medicare Other

## 2014-08-18 ENCOUNTER — Ambulatory Visit: Payer: Medicare Other | Admitting: Radiation Oncology

## 2014-08-23 ENCOUNTER — Ambulatory Visit
Admission: RE | Admit: 2014-08-23 | Discharge: 2014-08-23 | Disposition: A | Discharge status: Home / Self Care | Payer: Medicare Other | Source: Ambulatory Visit | Attending: Radiation Oncology | Admitting: Radiation Oncology

## 2014-08-23 DIAGNOSIS — C50412 Malignant neoplasm of upper-outer quadrant of left female breast: Secondary | ICD-10-CM

## 2014-08-23 DIAGNOSIS — Z510 Encounter for antineoplastic radiation therapy: Secondary | ICD-10-CM | POA: Diagnosis not present

## 2014-08-23 NOTE — Progress Notes (Signed)
  Radiation Oncology         [phone] ________________________________  Name: Kimberly [name] MRN: [id number]  Date: 08/23/2014  DOB: 1971/09/16  SIMULATION AND TREATMENT PLANNING NOTE  Outpatient  DIAGNOSIS:     ICD-9-CM ICD-10-CM   1. Breast cancer of upper-outer quadrant of left female breast 174.4 C50.412     NARRATIVE:  The patient was brought to the [facility].  Identity was confirmed.  All relevant records and images related to the planned course of therapy were reviewed.  The patient freely provided informed written consent to proceed with treatment after reviewing the details related to the planned course of therapy. The consent form was witnessed and verified by the simulation staff.    Then, the patient was set-up in a stable reproducible  supine position for radiation therapy.  CT images were obtained.  Surface markings were placed.  The CT images were loaded into the planning software.    TREATMENT PLANNING NOTE: Treatment planning then occurred.  The radiation prescription was entered and confirmed.    A total of 5 medically necessary complex treatment devices were fabricated and supervised by me: 4 fields with MLCs to block lungs, heart; and, vaclock for head/arm. I have requested : 3D Simulation  I have requested a DVH of the following structures: lungs, cord, heart, esophagus, lump cav.    The patient will receive  1) L breast  / 50.4 Gy in 28 fractions with Opposed tangents 2) Left Supraclavicular fossa/ 50.4  Gy in 28 fractions with RAO field 3) Left Posterior Axillary boost to give a total 50.4 Gy in 28 fractions to axilla with PA field.  4) Right Chest Wall Scar boost / 10 Gy in 5 fractions -- boost to be planned at later date.   Given the patient's difficulty with complying with moderately complex instructions, DIBH 4DCT simulation was not conducted. I believe I can protect the majority of her heart without this technique.   Optical  Surface Tracking Plan:  Since intensity modulated radiotherapy (IMRT) and 3D conformal radiation treatment methods are predicated on accurate and precise positioning for treatment, intrafraction motion monitoring is medically necessary to ensure accurate and safe treatment delivery. The ability to quantify intrafraction motion without excessive ionizing radiation dose can only be performed with optical surface tracking. Accordingly, surface imaging offers the opportunity to obtain 3D measurements of patient position throughout IMRT and 3D treatments without excessive radiation exposure. I am ordering optical surface tracking for this patient's upcoming course of radiotherapy.  ________________________________   Reference:  Ursula Alert, J, et al. Surface imaging-based analysis of intrafraction motion for breast radiotherapy patients.Journal of [place], n. 6, nov. 2014. ISSN [id number].  Available at: <http://www.jacmp.org/index.php/jacmp/article/view/4957>.   -----------------------------------  [name], MD

## 2014-08-24 ENCOUNTER — Other Ambulatory Visit: Payer: Medicare Other

## 2014-08-24 ENCOUNTER — Ambulatory Visit: Payer: Medicare Other | Admitting: Adult Health

## 2014-08-30 ENCOUNTER — Ambulatory Visit
Admission: RE | Admit: 2014-08-30 | Discharge: 2014-08-30 | Disposition: A | Discharge status: Home / Self Care | Payer: Medicare Other | Source: Ambulatory Visit | Attending: Radiation Oncology | Admitting: Radiation Oncology

## 2014-08-30 DIAGNOSIS — Z510 Encounter for antineoplastic radiation therapy: Secondary | ICD-10-CM | POA: Diagnosis not present

## 2014-08-30 DIAGNOSIS — C50412 Malignant neoplasm of upper-outer quadrant of left female breast: Secondary | ICD-10-CM

## 2014-08-30 NOTE — Progress Notes (Signed)
Simulation Verification Note Outpatient   ICD-9-CM ICD-10-CM   1. Breast cancer of upper-outer quadrant of left female breast 174.4 C50.412     The patient was brought to the treatment unit and placed in the planned treatment position. The clinical setup was verified. Then port films were obtained and uploaded to the radiation oncology medical record software.  The treatment beams were carefully compared against the planned radiation fields. The position location and shape of the radiation fields was reviewed. They targeted volume of tissue appears to be appropriately covered by the radiation beams. Organs at risk appear to be excluded as planned.  Based on my personal review, I approved the simulation verification. The patient's treatment will proceed as planned.  -----------------------------------  [name], MD

## 2014-08-31 ENCOUNTER — Ambulatory Visit
Admission: RE | Admit: 2014-08-31 | Discharge: 2014-08-31 | Disposition: A | Discharge status: Home / Self Care | Payer: Medicare Other | Source: Ambulatory Visit | Attending: Radiation Oncology | Admitting: Radiation Oncology

## 2014-08-31 DIAGNOSIS — Z510 Encounter for antineoplastic radiation therapy: Secondary | ICD-10-CM | POA: Diagnosis not present

## 2014-09-01 ENCOUNTER — Ambulatory Visit
Admission: RE | Admit: 2014-09-01 | Discharge: 2014-09-01 | Disposition: A | Discharge status: Home / Self Care | Payer: Medicare Other | Source: Ambulatory Visit | Attending: Radiation Oncology | Admitting: Radiation Oncology

## 2014-09-01 DIAGNOSIS — Z510 Encounter for antineoplastic radiation therapy: Secondary | ICD-10-CM | POA: Diagnosis not present

## 2014-09-02 ENCOUNTER — Ambulatory Visit
Admission: RE | Admit: 2014-09-02 | Discharge: 2014-09-02 | Disposition: A | Discharge status: Home / Self Care | Payer: Medicare Other | Source: Ambulatory Visit | Attending: Radiation Oncology | Admitting: Radiation Oncology

## 2014-09-02 DIAGNOSIS — Z510 Encounter for antineoplastic radiation therapy: Secondary | ICD-10-CM | POA: Diagnosis not present

## 2014-09-03 ENCOUNTER — Ambulatory Visit
Admission: RE | Admit: 2014-09-03 | Discharge: 2014-09-03 | Disposition: A | Discharge status: Home / Self Care | Payer: Medicare Other | Source: Ambulatory Visit | Attending: Radiation Oncology | Admitting: Radiation Oncology

## 2014-09-03 DIAGNOSIS — Z510 Encounter for antineoplastic radiation therapy: Secondary | ICD-10-CM | POA: Diagnosis not present

## 2014-09-06 ENCOUNTER — Ambulatory Visit
Admission: RE | Admit: 2014-09-06 | Discharge: 2014-09-06 | Disposition: A | Discharge status: Home / Self Care | Payer: Medicare Other | Source: Ambulatory Visit | Attending: Radiation Oncology | Admitting: Radiation Oncology

## 2014-09-06 ENCOUNTER — Encounter: Payer: Self-pay | Admitting: Radiation Oncology

## 2014-09-06 VITALS — BP 103/56 | HR 89 | Temp 98.1°F | Resp 16 | Wt 282.8 lb

## 2014-09-06 DIAGNOSIS — C50412 Malignant neoplasm of upper-outer quadrant of left female breast: Secondary | ICD-10-CM | POA: Diagnosis not present

## 2014-09-06 DIAGNOSIS — Z510 Encounter for antineoplastic radiation therapy: Secondary | ICD-10-CM | POA: Insufficient documentation

## 2014-09-06 MED ORDER — RADIAPLEXRX EX GEL
Freq: Once | CUTANEOUS | Status: AC
  Administered 2014-09-06: 16:00:00 via TOPICAL

## 2014-09-06 MED ORDER — ALRA NON-METALLIC DEODORANT (RAD-ONC)
1.0000 "application " | Freq: Once | TOPICAL | Status: AC
  Administered 2014-09-06: 1 via TOPICAL

## 2014-09-06 NOTE — Progress Notes (Signed)
   Weekly Management Note:  Outpatient    ICD-9-CM ICD-10-CM   1. Breast cancer of upper-outer quadrant of left female breast 174.4 C50.412 hyaluronate sodium (RADIAPLEXRX) gel     non-metallic deodorant (ALRA) 1 application    Current Dose:  9 Gy  Projected Dose: 60.4 Gy   Narrative:  The patient presents for routine under treatment assessment.  CBCT/MVCT images/Port film x-rays were reviewed.  The chart was checked. No acute effects from RT thus far  Physical Findings:  weight is 282 lb 12.8 oz (128.277 kg). Her oral temperature is 98.1 F (36.7 C). Her blood pressure is 103/56 and her pulse is 89. Her respiration is 16.  NAD, raised follicles in left inframammary fold   Impression:  The patient is tolerating radiotherapy.  Plan:  Continue radiotherapy as planned. Reminded pt that I will not Rx her narcotic refills.  ________________________________   Kimberly [name], M.D.

## 2014-09-06 NOTE — Progress Notes (Signed)
Weekly rad left breast  5 treatments completed, mild erythema starting on mid breast, c/o itchy on nipple, under inframmary fold, sweaty and 1-2 areas looks like skin may break soon, Kimberly [name] teaching done, rad book ,alra and radiaplex gel given, discussed fatigue, skin irritation , aply skin products after rad tx and bedtime , nothing to skin area 4 hours before treatment, eat more protein in diet, stay hydrated, drink plenty water, patient c/o 8/10 pain scale of her back, washed clothes this am, after 20 minutes would have to stop and sit down, several times, she is requesting refill on her percocet ran out yesterday, insomnia, on resperdil 3mg  at night, states"not helping any either, all questions answered, teach back given 4:00 PM

## 2014-09-07 ENCOUNTER — Ambulatory Visit
Admission: RE | Admit: 2014-09-07 | Discharge: 2014-09-07 | Disposition: A | Discharge status: Home / Self Care | Payer: Medicare Other | Source: Ambulatory Visit | Attending: Radiation Oncology | Admitting: Radiation Oncology

## 2014-09-07 ENCOUNTER — Other Ambulatory Visit: Payer: Medicare Other

## 2014-09-07 ENCOUNTER — Ambulatory Visit: Payer: Medicare Other | Admitting: Nurse Practitioner

## 2014-09-07 DIAGNOSIS — Z510 Encounter for antineoplastic radiation therapy: Secondary | ICD-10-CM | POA: Diagnosis not present

## 2014-09-08 ENCOUNTER — Ambulatory Visit: Payer: Medicare Other

## 2014-09-08 ENCOUNTER — Ambulatory Visit
Admission: RE | Admit: 2014-09-08 | Discharge: 2014-09-08 | Disposition: A | Discharge status: Home / Self Care | Payer: Medicare Other | Source: Ambulatory Visit | Attending: Radiation Oncology | Admitting: Radiation Oncology

## 2014-09-08 DIAGNOSIS — Z510 Encounter for antineoplastic radiation therapy: Secondary | ICD-10-CM | POA: Diagnosis not present

## 2014-09-09 ENCOUNTER — Ambulatory Visit
Admission: RE | Admit: 2014-09-09 | Discharge: 2014-09-09 | Disposition: A | Discharge status: Home / Self Care | Payer: Medicare Other | Source: Ambulatory Visit | Attending: Radiation Oncology | Admitting: Radiation Oncology

## 2014-09-09 DIAGNOSIS — Z510 Encounter for antineoplastic radiation therapy: Secondary | ICD-10-CM | POA: Diagnosis not present

## 2014-09-10 ENCOUNTER — Ambulatory Visit
Admission: RE | Admit: 2014-09-10 | Discharge: 2014-09-10 | Disposition: A | Discharge status: Home / Self Care | Payer: Medicare Other | Source: Ambulatory Visit | Attending: Radiation Oncology | Admitting: Radiation Oncology

## 2014-09-10 ENCOUNTER — Encounter: Payer: Self-pay | Admitting: *Deleted

## 2014-09-10 ENCOUNTER — Encounter: Payer: Self-pay | Admitting: Radiation Oncology

## 2014-09-10 DIAGNOSIS — Z510 Encounter for antineoplastic radiation therapy: Secondary | ICD-10-CM | POA: Diagnosis not present

## 2014-09-10 DIAGNOSIS — C50412 Malignant neoplasm of upper-outer quadrant of left female breast: Secondary | ICD-10-CM

## 2014-09-10 NOTE — Progress Notes (Signed)
[place] Work  Clinical Social Work met with pt and husband for re-assessment of psychosocial needs prior to radiation.  Clinical Social Worker has worked with pt and family in the past and wanted to check in, offer support and reassess needs. Pt reports she is tired, but coping well emotionally at this point. Her children have all been ill recently with a stomach bug and this has added to the additional stress. CSW encouraged pt and husband to attend support programs when able. This has been challenging with her five children and their many needs. CSW also discussed resources to assist with the holidays. Pt plans to return Monday and provide info for holiday assistance. Pt very appreciative and more encouraged after meeting with CSW.   Clinical Social Work interventions: Emotional support Resource assistance  Loren Racer, [place] Worker [place]  [place] Phone: [phone] Fax: [phone]

## 2014-09-13 ENCOUNTER — Ambulatory Visit
Admission: RE | Admit: 2014-09-13 | Discharge: 2014-09-13 | Disposition: A | Discharge status: Home / Self Care | Payer: Medicare Other | Source: Ambulatory Visit | Attending: Radiation Oncology | Admitting: Radiation Oncology

## 2014-09-13 ENCOUNTER — Encounter: Payer: Self-pay | Admitting: Radiation Oncology

## 2014-09-13 VITALS — BP 123/75 | HR 86 | Temp 98.0°F | Ht 68.0 in | Wt 286.3 lb

## 2014-09-13 DIAGNOSIS — Z510 Encounter for antineoplastic radiation therapy: Secondary | ICD-10-CM | POA: Insufficient documentation

## 2014-09-13 DIAGNOSIS — C50412 Malignant neoplasm of upper-outer quadrant of left female breast: Secondary | ICD-10-CM | POA: Diagnosis not present

## 2014-09-13 MED ORDER — RADIAPLEXRX EX GEL
Freq: Once | CUTANEOUS | Status: AC
  Administered 2014-09-13: 10:00:00 via TOPICAL

## 2014-09-13 NOTE — Progress Notes (Addendum)
Kimberly [name] has received 10 fractions to her left breast. She reports feeling very fatigued because of since she is taking care of her household and 5 children.  Skin on her left breast is intact with faint change in pigmentation.

## 2014-09-13 NOTE — Progress Notes (Signed)
   Weekly Management Note:  outpatient    ICD-9-CM ICD-10-CM   1. Breast cancer of upper-outer quadrant of left female breast 174.4 C50.412 hyaluronate sodium (RADIAPLEXRX) gel    Current Dose:  18 Gy  Projected Dose: 60.4 Gy   Narrative:  The patient presents for routine under treatment assessment.  CBCT/MVCT images/Port film x-rays were reviewed.  The chart was checked. She is tired.  Physical Findings:  height is 5\' 8"  (1.727 m) and weight is 286 lb 4.8 oz (129.865 kg). Her temperature is 98 F (36.7 C). Her blood pressure is 123/75 and her pulse is 86.  left breast skin is hyperpigmented  Impression:  The patient is tolerating radiotherapy.  Plan:  Continue radiotherapy as planned.   ________________________________   [name], M.D.

## 2014-09-14 ENCOUNTER — Ambulatory Visit: Payer: Medicare Other | Admitting: Oncology

## 2014-09-14 ENCOUNTER — Ambulatory Visit
Admission: RE | Admit: 2014-09-14 | Discharge: 2014-09-14 | Disposition: A | Discharge status: Home / Self Care | Payer: Medicare Other | Source: Ambulatory Visit | Attending: Radiation Oncology | Admitting: Radiation Oncology

## 2014-09-14 ENCOUNTER — Other Ambulatory Visit: Payer: Medicare Other

## 2014-09-14 DIAGNOSIS — Z510 Encounter for antineoplastic radiation therapy: Secondary | ICD-10-CM | POA: Diagnosis not present

## 2014-09-15 ENCOUNTER — Other Ambulatory Visit: Payer: Self-pay | Admitting: *Deleted

## 2014-09-15 ENCOUNTER — Ambulatory Visit
Admission: RE | Admit: 2014-09-15 | Discharge: 2014-09-15 | Disposition: A | Discharge status: Home / Self Care | Payer: Medicare Other | Source: Ambulatory Visit | Attending: Radiation Oncology | Admitting: Radiation Oncology

## 2014-09-15 DIAGNOSIS — Z510 Encounter for antineoplastic radiation therapy: Secondary | ICD-10-CM | POA: Diagnosis not present

## 2014-09-15 MED ORDER — QUETIAPINE FUMARATE 200 MG PO TABS
200.0000 mg | ORAL_TABLET | Freq: Two times a day (BID) | ORAL | Status: DC
Start: 2014-09-15 — End: 2016-11-16

## 2014-09-17 ENCOUNTER — Ambulatory Visit: Payer: Medicare Other

## 2014-09-20 ENCOUNTER — Ambulatory Visit
Admission: RE | Admit: 2014-09-20 | Discharge: 2014-09-20 | Disposition: A | Discharge status: Home / Self Care | Payer: Medicare Other | Source: Ambulatory Visit | Attending: Radiation Oncology | Admitting: Radiation Oncology

## 2014-09-20 ENCOUNTER — Encounter: Payer: Self-pay | Admitting: Radiation Oncology

## 2014-09-20 VITALS — BP 127/74 | HR 92 | Temp 98.0°F | Resp 10 | Wt 274.7 lb

## 2014-09-20 DIAGNOSIS — Z510 Encounter for antineoplastic radiation therapy: Secondary | ICD-10-CM | POA: Diagnosis not present

## 2014-09-20 DIAGNOSIS — C50412 Malignant neoplasm of upper-outer quadrant of left female breast: Secondary | ICD-10-CM

## 2014-09-20 DIAGNOSIS — F3160 Bipolar disorder, current episode mixed, unspecified: Secondary | ICD-10-CM

## 2014-09-20 NOTE — Progress Notes (Signed)
   Weekly Management Note:  outpatient    ICD-9-CM ICD-10-CM   1. Breast cancer of upper-outer quadrant of left female breast 174.4 C50.412     Current Dose:  23.4 Gy  Projected Dose: 60.4 Gy   Narrative:  The patient presents for routine under treatment assessment.  CBCT/MVCT images/Port film x-rays were reviewed.  The chart was checked. She is tired and asking for Percocet to help the fatigue. She is raising 5 children and feels overwhelmed. Her husband has medical issues and has a home Programmer, applications.  Physical Findings:  weight is 274 lb 11.2 oz (124.603 kg). Her oral temperature is 98 F (36.7 C). Her blood pressure is 127/74 and her pulse is 92. Her respiration is 10 and oxygen saturation is 98%.  left breast skin is hyperpigmented, with darker patch in inframammary fold  Impression:  The patient is tolerating radiotherapy.  Plan:  Continue radiotherapy as planned. I reminded her that I will not fill narcotic Rx for her.  I also do no Rx Percocet for fatigue. But, I will re-refer her to Social Work for significant social stressors.  I think her 5 kids are overwhelming her and any assistance would be welcome.  ________________________________   [name], M.D.

## 2014-09-20 NOTE — Progress Notes (Signed)
She rates her pain as a 5 on a scale of 0-10. Pt complains of skin sensitiveness and itching, Fatigue-reports its severe, Generalized Weakness, Pain  Stabbing and Pain Occurs  Intermittently under the RIGHT breast fold, no pain on the treatment side.  Pt left breast noted small area of moist desquamation under left breast fold, hyperpigmentation noted as well. Continues to apply Radiaplex. Denies Edema.

## 2014-09-21 ENCOUNTER — Ambulatory Visit
Admission: RE | Admit: 2014-09-21 | Discharge: 2014-09-21 | Disposition: A | Discharge status: Home / Self Care | Payer: Medicare Other | Source: Ambulatory Visit | Attending: Radiation Oncology | Admitting: Radiation Oncology

## 2014-09-21 DIAGNOSIS — Z510 Encounter for antineoplastic radiation therapy: Secondary | ICD-10-CM | POA: Diagnosis not present

## 2014-09-22 ENCOUNTER — Ambulatory Visit
Admission: RE | Admit: 2014-09-22 | Discharge: 2014-09-22 | Disposition: A | Discharge status: Home / Self Care | Payer: Medicare Other | Source: Ambulatory Visit | Attending: Radiation Oncology | Admitting: Radiation Oncology

## 2014-09-22 DIAGNOSIS — Z510 Encounter for antineoplastic radiation therapy: Secondary | ICD-10-CM | POA: Diagnosis not present

## 2014-09-23 ENCOUNTER — Encounter: Payer: Self-pay | Admitting: *Deleted

## 2014-09-23 ENCOUNTER — Ambulatory Visit
Admission: RE | Admit: 2014-09-23 | Discharge: 2014-09-23 | Disposition: A | Discharge status: Home / Self Care | Payer: Medicare Other | Source: Ambulatory Visit | Attending: Radiation Oncology | Admitting: Radiation Oncology

## 2014-09-23 DIAGNOSIS — Z510 Encounter for antineoplastic radiation therapy: Secondary | ICD-10-CM | POA: Diagnosis not present

## 2014-09-23 NOTE — Progress Notes (Signed)
[facility] CLINICAL SOCIAL WORK PSYCHOSOCIAL ASSESSMENT   Date:  09/23/2014   First Name: Kimberly [name]   M.I.: H     Last Name: [name] MRN:  [id number]  The patient was referred by Dr Isidore Moos to assess for psychosocial, emotional, spiritual, and practical needs.    CSW met with pt after her radiation appointment, alone in dressing room.   Primary Cancer Type: Breast cancer of upper-outer quadrant of left female breast   Staging form: Breast, AJCC 7th Edition     Pathologic: Stage IIA (T1c, N1a, cM0) - Signed by [name], MD on 05/12/2014         Marital Status: Married  Practical Problems: yes     Employment:  on disability  Pt has not worked for about seven years. She witnessed her mother's murder and has not worked since.    Source of Income: Social Security Disability and VA Benefit Pt is on disability and husband is a Dealer.     Insurance: [employer]    Family Problems: yes  yes Concerns caring for family needs: yes   Pt has five children. Two older boys ages 69 and 34. Three younger children: boy, 53, girl 93 and girl 8. The older children help some, but the younger ones have many needs, school responsibilities, etc. Kimberly [name] has described concerns related to 42yo, Kimberly [name]'s behavior and school concerns as well.   Emotional Problems: yes  Concerns of Adjustment to Diagnosis/Treatment: yes   Current Symptoms of Anxiety: no   Current Symptoms of Depression: no  Safety/Risk Concerns: no   Pt reports extreme fatigue due to radiation treatment and caring for five children. She feels her bipolar and other psych concerns are well managed currently.   Mental Status Exam Orientation: person, place, and time Affect: appropriate Thought: normal      Spiritual/Religious: no  Living Situation: with family  Functional Status: Independent Pt could benefit from additional assistance at home due to demands of five children.                                                                          Strengths and Barriers To Treatment:   Patient Coping Strengths:   Supportive Relationships, Hopefulness, Self Advocate and Able to Communicate Effectively                                                                                                  Identified Problems/Needs and Barriers to Care:   Financial, Transportation and Family and Caregiver Ability    Counseling and Social Work Interventions and Recommendations:    Brief Counseling/Psychotherapy, Data processing manager, Conservator, museum/gallery strategies, Emergency planning/management officer, Support Group/Group Counseling and Other  CSW met with pt at length in attempt to problem solve and assist with processing current concerns. Overall, Kimberly [name] has done  an amazing job at managing her children and her treatment currently. She and her husband work hard to make sure needs are addressed for the children despite multiple past traumas, psych issues, limited supports and chronic illness. [name] biggest stress that she identified was daily care of children, cooking and cleaning. Currently, she feels well emotionally. Just very fatigued.  CSW and pt came up with several ideas to reduce stress at home. CSW made referral for transportation assistance through [employer]. Pt is also open to an [employer] and agrees to referral. Pt may also qualify for assistance through grant funds and was provided with financial counselor contacts. CSW had already met with Kimberly [name] several weeks ago to assist with Christmas needs. We also discussed ways to get more assistance from the kids helping out more around the house. CSW also plans to look into cleaning assistance for her. Pt plans to reach out to the kids' schools once again and let them be aware of her treatment. Pt reports the family has three science fair projects to work on over the weekend!  CSW to complete needed referrals and  continue to follow closely. Pt was very appreciative of support today.   Kimberly [name], [place] Worker [place]  [place] Phone: [phone] Fax: [phone]

## 2014-09-24 ENCOUNTER — Ambulatory Visit
Admission: RE | Admit: 2014-09-24 | Discharge: 2014-09-24 | Disposition: A | Discharge status: Home / Self Care | Payer: Medicare Other | Source: Ambulatory Visit | Attending: Radiation Oncology | Admitting: Radiation Oncology

## 2014-09-24 DIAGNOSIS — Z510 Encounter for antineoplastic radiation therapy: Secondary | ICD-10-CM | POA: Diagnosis not present

## 2014-09-27 ENCOUNTER — Ambulatory Visit
Admission: RE | Admit: 2014-09-27 | Discharge: 2014-09-27 | Disposition: A | Discharge status: Home / Self Care | Payer: Medicare Other | Source: Ambulatory Visit | Attending: Radiation Oncology | Admitting: Radiation Oncology

## 2014-09-27 ENCOUNTER — Encounter: Payer: Self-pay | Admitting: Radiation Oncology

## 2014-09-27 VITALS — BP 119/74 | HR 80 | Temp 98.1°F | Ht 68.0 in | Wt 274.0 lb

## 2014-09-27 DIAGNOSIS — C50412 Malignant neoplasm of upper-outer quadrant of left female breast: Secondary | ICD-10-CM

## 2014-09-27 DIAGNOSIS — Z510 Encounter for antineoplastic radiation therapy: Secondary | ICD-10-CM | POA: Diagnosis not present

## 2014-09-27 NOTE — Progress Notes (Signed)
Ms. Larrivee here today with new presentation of mild, dry desquamation in her left inframmary fold.  She c/o of level 5 pain in this area.  Note presence of mild hyperpigmentation of entire breast.  Presently alternating neosporin with radiaplex gel in the inframmary fold.  Seen by Dr. Isidore Moos today.

## 2014-09-27 NOTE — Progress Notes (Signed)
   Weekly Management Note:  outpatient    ICD-9-CM ICD-10-CM   1. Breast cancer of upper-outer quadrant of left female breast 174.4 C50.412     Current Dose:   32.4Gy  Projected Dose: 60.4 Gy   Narrative:  The patient presents for routine under treatment assessment.  CBCT/MVCT images/Port film x-rays were reviewed.  The chart was checked. Social Work is helping her. Kimberly [name] here today with new presentation of mild, dry desquamation in her left inframmary fold.  She c/o of level 5 pain in this area.   Presently alternating neosporin with radiaplex gel in the inframmary fold.    Physical Findings:  height is 5\' 8"  (1.727 m) and weight is 274 lb (124.286 kg). Her temperature is 98.1 F (36.7 C). Her blood pressure is 119/74 and her pulse is 80.  left breast skin is hyperpigmented, with darker dry desquamating patch in inframammary fold  Impression:  The patient is tolerating radiotherapy.  Plan:  Continue radiotherapy as planned.  Social Work is following her.  Neosporin at inframammary fold; Radiaplex to be used elsewhere. Discussed this w/ patient.  ________________________________   [name], M.D.

## 2014-09-28 ENCOUNTER — Ambulatory Visit: Payer: Medicare Other

## 2014-09-28 ENCOUNTER — Telehealth: Payer: Self-pay | Admitting: *Deleted

## 2014-09-28 NOTE — Telephone Encounter (Addendum)
Ms. Gundlach called today to assess reason for canceling treatment today.  She stated she was just very tired and was lying in bed resting, but denied any other concerns.  Explained to her the importance of receiving daily treatment,and not missing treatments, so that she can gain the most effective benefit from her radiation therapy treatments.  also stated that this RN will check her status on tomorrow.   She stated understanding.

## 2014-09-29 ENCOUNTER — Ambulatory Visit
Admission: RE | Admit: 2014-09-29 | Discharge: 2014-09-29 | Disposition: A | Discharge status: Home / Self Care | Payer: Medicare Other | Source: Ambulatory Visit | Attending: Radiation Oncology | Admitting: Radiation Oncology

## 2014-09-29 DIAGNOSIS — Z510 Encounter for antineoplastic radiation therapy: Secondary | ICD-10-CM | POA: Diagnosis not present

## 2014-09-30 ENCOUNTER — Ambulatory Visit
Admission: RE | Admit: 2014-09-30 | Discharge: 2014-09-30 | Disposition: A | Discharge status: Home / Self Care | Payer: Medicare Other | Source: Ambulatory Visit | Attending: Radiation Oncology | Admitting: Radiation Oncology

## 2014-09-30 DIAGNOSIS — Z510 Encounter for antineoplastic radiation therapy: Secondary | ICD-10-CM | POA: Diagnosis not present

## 2014-10-01 ENCOUNTER — Ambulatory Visit
Admission: RE | Admit: 2014-10-01 | Discharge: 2014-10-01 | Disposition: A | Discharge status: Home / Self Care | Payer: Medicare Other | Source: Ambulatory Visit | Attending: Radiation Oncology | Admitting: Radiation Oncology

## 2014-10-01 DIAGNOSIS — Z510 Encounter for antineoplastic radiation therapy: Secondary | ICD-10-CM | POA: Diagnosis not present

## 2014-10-04 ENCOUNTER — Encounter: Payer: Self-pay | Admitting: Radiation Oncology

## 2014-10-04 ENCOUNTER — Ambulatory Visit
Admission: RE | Admit: 2014-10-04 | Discharge: 2014-10-04 | Disposition: A | Discharge status: Home / Self Care | Payer: Medicare Other | Source: Ambulatory Visit | Attending: Radiation Oncology | Admitting: Radiation Oncology

## 2014-10-04 ENCOUNTER — Ambulatory Visit: Payer: Medicare Other | Admitting: Radiation Oncology

## 2014-10-04 VITALS — BP 126/70 | HR 78 | Temp 98.0°F | Resp 16 | Wt 280.2 lb

## 2014-10-04 DIAGNOSIS — C50412 Malignant neoplasm of upper-outer quadrant of left female breast: Secondary | ICD-10-CM

## 2014-10-04 DIAGNOSIS — Z510 Encounter for antineoplastic radiation therapy: Secondary | ICD-10-CM | POA: Diagnosis not present

## 2014-10-04 NOTE — Progress Notes (Addendum)
She is currently in no pain. However, pt reports she feels she is in pain, but isn't going to say she is.  Pt complains of Loss of Sleep, Fatigue and Generalized Weakness.  Pt left breast noted hyperpigmentation over axilla, and breast fold.  Noted moist desquamation under breast fold.  Pt reports continued use of Radiaplex and applies Neosporin to breast fold.  Denies edema.

## 2014-10-04 NOTE — Progress Notes (Signed)
   Weekly Management Note:  outpatient    ICD-9-CM ICD-10-CM   1. Breast cancer of upper-outer quadrant of left female breast 174.4 C50.412     Current Dose:    39.6 Gy  Projected Dose: 60.4 Gy   Narrative:  The patient presents for routine under treatment assessment.  CBCT/MVCT images/Port film x-rays were reviewed.  The chart was checked.  No new complaints. Pt reports continued use of Radiaplex and applies Neosporin to breast fold.     Physical Findings:  weight is 280 lb 3.2 oz (127.098 kg). Her oral temperature is 98 F (36.7 C). Her blood pressure is 126/70 and her pulse is 78. Her respiration is 16 and oxygen saturation is 97%.  left breast skin is hyperpigmented, with darker early moist desquamating patch in inframammary fold  Impression:  The patient is tolerating radiotherapy.  Plan:  Continue radiotherapy as planned.  Social Work is following her.  Neosporin at inframammary fold; Radiaplex to be used elsewhere.  .  ________________________________   [name], M.D.

## 2014-10-05 ENCOUNTER — Ambulatory Visit: Payer: Medicare Other | Admitting: Oncology

## 2014-10-05 ENCOUNTER — Other Ambulatory Visit: Payer: Medicare Other

## 2014-10-05 ENCOUNTER — Ambulatory Visit
Admission: RE | Admit: 2014-10-05 | Discharge: 2014-10-05 | Disposition: A | Discharge status: Home / Self Care | Payer: Medicare Other | Source: Ambulatory Visit | Attending: Radiation Oncology | Admitting: Radiation Oncology

## 2014-10-05 DIAGNOSIS — Z510 Encounter for antineoplastic radiation therapy: Secondary | ICD-10-CM | POA: Diagnosis not present

## 2014-10-06 ENCOUNTER — Ambulatory Visit
Admission: RE | Admit: 2014-10-06 | Discharge: 2014-10-06 | Disposition: A | Discharge status: Home / Self Care | Payer: Medicare Other | Source: Ambulatory Visit | Attending: Radiation Oncology | Admitting: Radiation Oncology

## 2014-10-06 DIAGNOSIS — Z510 Encounter for antineoplastic radiation therapy: Secondary | ICD-10-CM | POA: Diagnosis not present

## 2014-10-07 ENCOUNTER — Ambulatory Visit: Payer: Medicare Other

## 2014-10-08 ENCOUNTER — Ambulatory Visit
Admission: RE | Admit: 2014-10-08 | Discharge: 2014-10-08 | Disposition: A | Discharge status: Home / Self Care | Payer: Medicare Other | Source: Ambulatory Visit | Attending: Radiation Oncology | Admitting: Radiation Oncology

## 2014-10-08 ENCOUNTER — Encounter: Payer: Self-pay | Admitting: *Deleted

## 2014-10-08 DIAGNOSIS — Z510 Encounter for antineoplastic radiation therapy: Secondary | ICD-10-CM | POA: Diagnosis not present

## 2014-10-08 NOTE — Progress Notes (Signed)
[place] Work  Clinical Social Work was referred by Pension scheme manager for assessment of psychosocial needs.  Clinical Social Worker contacted patient at home to offer support and assess for needs.  Pt reports her son fell out of a truck and went to [employer] to be evaluated. He is ok, but has a concussion. CSW provided support and will see pt next week when she is here for radiation. Pt denies other concerns currently and glad he is ok.   Clinical Social Work interventions: Emotional support  Loren Racer, [place] Social Worker [place]  [place] Phone: [phone] Fax: [phone]

## 2014-10-11 ENCOUNTER — Encounter: Payer: Self-pay | Admitting: Radiation Oncology

## 2014-10-11 ENCOUNTER — Ambulatory Visit
Admission: RE | Admit: 2014-10-11 | Discharge: 2014-10-11 | Disposition: A | Discharge status: Home / Self Care | Payer: Medicare Other | Source: Ambulatory Visit | Attending: Radiation Oncology | Admitting: Radiation Oncology

## 2014-10-11 VITALS — BP 132/70 | HR 93 | Temp 97.5°F | Resp 12 | Wt 276.9 lb

## 2014-10-11 DIAGNOSIS — Z510 Encounter for antineoplastic radiation therapy: Secondary | ICD-10-CM | POA: Diagnosis not present

## 2014-10-11 DIAGNOSIS — C50412 Malignant neoplasm of upper-outer quadrant of left female breast: Secondary | ICD-10-CM

## 2014-10-11 DIAGNOSIS — R5383 Other fatigue: Secondary | ICD-10-CM | POA: Diagnosis not present

## 2014-10-11 DIAGNOSIS — R531 Weakness: Secondary | ICD-10-CM | POA: Insufficient documentation

## 2014-10-11 MED ORDER — BIAFINE EX EMUL
Freq: Two times a day (BID) | CUTANEOUS | Status: DC
  Administered 2014-10-11: 11:00:00 via TOPICAL

## 2014-10-11 NOTE — Progress Notes (Signed)
She is currently in no pain. Pt complains of Fatigue and Generalized Weakness.  Pt left breast positive for breast tenderness and pruritus.  Erythema noted and moist desquamation under left breast fold.   Pt continues to use neosporin under breast.

## 2014-10-11 NOTE — Progress Notes (Signed)
Simulation Verification Note    ICD-9-CM ICD-10-CM   1. Breast cancer of upper-outer quadrant of left female breast 174.4 C50.412     The patient was brought to the treatment unit and placed in the planned treatment position. The clinical setup was verified. The electron cone collides with the patient's arm. Therefore, dosimetry will replan her boost with a change in the electron field's collimator rotation.  -----------------------------------  [name], MD

## 2014-10-11 NOTE — Addendum Note (Signed)
Encounter addended by: Jenene Slicker, RN on: 10/11/2014 11:20 AM<BR>     Documentation filed: Dx Association, Inpatient MAR, Orders

## 2014-10-11 NOTE — Progress Notes (Signed)
   Weekly Management Note:  outpatient    ICD-9-CM ICD-10-CM   1. Breast cancer of upper-outer quadrant of left female breast 174.4 C50.412     Current Dose:    46.8 Gy  Projected Dose: 60.4 Gy   Narrative:  The patient presents for routine under treatment assessment.  CBCT/MVCT images/Port film x-rays were reviewed.  The chart was checked.   Fatigue, skin irritation persist  Physical Findings:  weight is 276 lb 14.4 oz (125.601 kg). Her oral temperature is 97.5 F (36.4 C). Her blood pressure is 132/70 and her pulse is 93. Her respiration is 12 and oxygen saturation is 98%.  left breast skin is hyperpigmented, with relatively stable early moist desquamating patch in inframammary fold  Impression:  The patient is tolerating radiotherapy.  Plan:  Continue radiotherapy as planned.  Social Work is following her.  Neosporin at inframammary fold; will try Biafine elsewhere.  F/u in 21mo after completion. She sees my partner in her last week of RT.  ________________________________   [name], M.D.

## 2014-10-12 ENCOUNTER — Ambulatory Visit: Admission: RE | Admit: 2014-10-12 | Payer: Medicare Other | Source: Ambulatory Visit

## 2014-10-12 DIAGNOSIS — Z510 Encounter for antineoplastic radiation therapy: Secondary | ICD-10-CM | POA: Diagnosis not present

## 2014-10-13 ENCOUNTER — Ambulatory Visit
Admission: RE | Admit: 2014-10-13 | Discharge: 2014-10-13 | Disposition: A | Discharge status: Home / Self Care | Payer: Medicare Other | Source: Ambulatory Visit | Attending: Radiation Oncology | Admitting: Radiation Oncology

## 2014-10-13 ENCOUNTER — Ambulatory Visit: Payer: Medicare Other

## 2014-10-13 DIAGNOSIS — Z510 Encounter for antineoplastic radiation therapy: Secondary | ICD-10-CM | POA: Diagnosis not present

## 2014-10-14 ENCOUNTER — Ambulatory Visit: Payer: Medicare Other

## 2014-10-14 DIAGNOSIS — Z510 Encounter for antineoplastic radiation therapy: Secondary | ICD-10-CM | POA: Diagnosis not present

## 2014-10-18 ENCOUNTER — Ambulatory Visit: Payer: Medicare Other

## 2014-10-18 ENCOUNTER — Ambulatory Visit
Admission: RE | Admit: 2014-10-18 | Discharge: 2014-10-18 | Disposition: A | Discharge status: Home / Self Care | Payer: Medicare Other | Source: Ambulatory Visit | Attending: Radiation Oncology | Admitting: Radiation Oncology

## 2014-10-18 ENCOUNTER — Encounter: Payer: Self-pay | Admitting: Radiation Oncology

## 2014-10-18 VITALS — BP 132/78 | HR 80 | Temp 99.3°F | Resp 20 | Wt 282.5 lb

## 2014-10-18 DIAGNOSIS — C50412 Malignant neoplasm of upper-outer quadrant of left female breast: Secondary | ICD-10-CM

## 2014-10-18 DIAGNOSIS — Z510 Encounter for antineoplastic radiation therapy: Secondary | ICD-10-CM | POA: Diagnosis not present

## 2014-10-18 NOTE — Progress Notes (Signed)
Weekly Management Note:  Site:L Breast/regional LNs (currently receiving left breast boost) Current Dose:  5440  cGy Projected Dose: 6040  cGy  Narrative: The patient is seen today for routine under treatment assessment. CBCT/MVCT images/port films were reviewed. The chart was reviewed.   She is generally doing well although she is having discomfort along the left inframammary region and axilla as expected.  She has been using triple antibiotic ointment along the inframammary region.  She uses Radioplex gel/Biafine cream elsewhere.  She finishes her radiation therapy this Thursday.   Physical Examination:  Filed Vitals:   10/18/14 0911  BP: 132/78  Pulse: 80  Temp: 99.3 F (37.4 C)  Resp: 20  .  Weight: 282 lb 8 oz (128.141 kg).  There is moderate erythema along the left breast with moist desquamation along the inframammary region and dry desquamation along the left axilla.  Impression: Tolerating radiation therapy well.  Plan: Continue radiation therapy as planned.

## 2014-10-18 NOTE — Progress Notes (Signed)
Weekly rad txs left breast 30/33 completed, erythema on breast , and on left subclavian,  and under inframmary fold peeling, rawness,dry desqamation, using neosporin there, biaifne and radiaplex  Bid elsewhere on breast, itching but no scratching Feels she is getting a cold, runny nose, low grade fever 99.3,drinks plenty water steted, appetite fine, fatigued 9:16 AM

## 2014-10-19 ENCOUNTER — Ambulatory Visit
Admission: RE | Admit: 2014-10-19 | Discharge: 2014-10-19 | Disposition: A | Discharge status: Home / Self Care | Payer: Medicare Other | Source: Ambulatory Visit | Attending: Radiation Oncology | Admitting: Radiation Oncology

## 2014-10-19 DIAGNOSIS — Z510 Encounter for antineoplastic radiation therapy: Secondary | ICD-10-CM | POA: Diagnosis not present

## 2014-10-20 ENCOUNTER — Ambulatory Visit: Admission: RE | Admit: 2014-10-20 | Payer: Medicare Other | Source: Ambulatory Visit

## 2014-10-20 ENCOUNTER — Ambulatory Visit
Admission: RE | Admit: 2014-10-20 | Discharge: 2014-10-20 | Disposition: A | Discharge status: Home / Self Care | Payer: Medicare Other | Source: Ambulatory Visit | Attending: Radiation Oncology | Admitting: Radiation Oncology

## 2014-10-20 DIAGNOSIS — Z510 Encounter for antineoplastic radiation therapy: Secondary | ICD-10-CM | POA: Diagnosis not present

## 2014-10-21 ENCOUNTER — Ambulatory Visit
Admission: RE | Admit: 2014-10-21 | Discharge: 2014-10-21 | Disposition: A | Discharge status: Home / Self Care | Payer: Medicare Other | Source: Ambulatory Visit | Attending: Radiation Oncology | Admitting: Radiation Oncology

## 2014-10-21 ENCOUNTER — Encounter: Payer: Self-pay | Admitting: Radiation Oncology

## 2014-10-21 ENCOUNTER — Ambulatory Visit: Payer: Medicare Other

## 2014-10-21 VITALS — BP 111/67 | HR 110 | Temp 97.8°F | Resp 12 | Wt 281.3 lb

## 2014-10-21 DIAGNOSIS — C50412 Malignant neoplasm of upper-outer quadrant of left female breast: Secondary | ICD-10-CM

## 2014-10-21 DIAGNOSIS — Z510 Encounter for antineoplastic radiation therapy: Secondary | ICD-10-CM | POA: Diagnosis not present

## 2014-10-21 MED ORDER — BIAFINE EX EMUL
Freq: Two times a day (BID) | CUTANEOUS | Status: DC
  Administered 2014-10-21: 10:00:00 via TOPICAL

## 2014-10-21 MED ORDER — ALRA NON-METALLIC DEODORANT (RAD-ONC)
1.0000 "application " | Freq: Once | TOPICAL | Status: AC
  Administered 2014-10-21: 1 via TOPICAL

## 2014-10-21 NOTE — Progress Notes (Signed)
She is currently in no pain. Pt complains of Fever-low grade-reports Monday it was 99.15F, Chills, Fatigue and Generalized Weakness.  Pt left breast positive for breast tenderness, dryness and pruritus, erythema. Noted moist desquamation under breast fold.  Pt continues to use Neosporin and Biafine. Denies Edema.

## 2014-10-21 NOTE — Progress Notes (Signed)
Weekly Management Note:  Site: Left breast boost Current Dose:  1000  cGy Projected Dose: 1000  CGy following 5040 cGy in 28 sessions to the left breast  Narrative: The patient is seen today for routine under treatment assessment. CBCT/MVCT images/port films were reviewed. The chart was reviewed.   She is without complaints today.  She has been using Neosporin ointment along the inframammary region.  She also uses Biafine cream.  Physical Examination:  Filed Vitals:   10/21/14 0936  BP: 111/67  Pulse: 110  Temp: 97.8 F (36.6 C)  Resp: 12  .  Weight: 281 lb 4.8 oz (127.597 kg).  There has been reepithelialization of her skin along the inframammary region.  There is dry desquamation along the axilla as seen earlier this week.  Impression: Tolerating radiation therapy well.  Radiation therapy completed.  Plan:  One-month follow-up visit with Dr. Isidore Moos.

## 2014-10-22 NOTE — Progress Notes (Signed)
  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 UOQ  Indication for treatment:  curative      Radiation treatment dates:   08/31/2014-10/21/2014  Site/dose:   Site/dose:   1) Left Breast / 50.4 Gy in 28 fractions 2) Left supraclavicular / 50.4 Gy in 28 fractions 3) Left Posterior axillary boost  / 9.52 Gy in 28 fractions 4) Left Breast Boost / 10 Gy in 5 fractions  Beams/energy:  1) tangents, 3D conformal  /  15MV and 6MV 2) RAO / 10 MV 3) PA / 6MV 4) electrons / 18 MeV  Narrative: The patient tolerated radiation treatment relatively well with skin irritation and fatigue.   Social Work followed her closely.  Plan: The patient has completed radiation treatment. The patient will return to radiation oncology clinic for routine followup in one month. I advised them to call or return sooner if they have any questions or concerns related to their recovery or treatment.  -----------------------------------  [name], MD

## 2014-11-08 NOTE — Progress Notes (Deleted)
10/11/14 Electron Boost Emergency planning/management officer Note  Diagnosis: Breast Cancer   The patient's CT images from her initial simulation were reviewed to plan her boost treatment to her Left breast  lumpectomy cavity.  Measurements were made regarding the size and depth of the surgical bed. The boost to the lumpectomy cavity will be delivered with 18 MeV electrons; 10 Gy in 5 fractions has been prescribed to the 100% isodose line.   An electron Best boy was reviewed and approved.  A custom electron cut-out will be used for her boost field.    -----------------------------------  [name], MD

## 2014-11-08 NOTE — Progress Notes (Signed)
10/11/14 Electron Boost Emergency planning/management officer Note  Diagnosis: Breast Cancer   The patient's CT images from her initial simulation were reviewed to plan her boost treatment to her Left breast lumpectomy cavity. Measurements were made regarding the size and depth of the surgical bed. The boost to the lumpectomy cavity will be delivered with 18 MeV electrons; 10 Gy in 5 fractions has been prescribed to the 100% isodose line. An electron Best boy was reviewed and approved. A custom electron cut-out will be used for her boost field.   -----------------------------------  [name], MD

## 2014-11-18 ENCOUNTER — Encounter: Payer: Self-pay | Admitting: *Deleted

## 2014-11-19 ENCOUNTER — Other Ambulatory Visit: Payer: Self-pay | Admitting: Emergency Medicine

## 2014-11-19 ENCOUNTER — Telehealth: Payer: Self-pay | Admitting: Oncology

## 2014-11-19 NOTE — Telephone Encounter (Signed)
S/w pt's husband confirming labs/ov per 01/29 POF, mailed out sch to pt... KJ

## 2014-11-23 ENCOUNTER — Encounter: Payer: Self-pay | Admitting: Family Medicine

## 2014-11-23 ENCOUNTER — Ambulatory Visit (INDEPENDENT_AMBULATORY_CARE_PROVIDER_SITE_OTHER): Payer: Commercial Managed Care - HMO | Admitting: Family Medicine

## 2014-11-23 VITALS — BP 126/73 | HR 118 | Temp 98.1°F | Ht 68.0 in | Wt 276.9 lb

## 2014-11-23 DIAGNOSIS — J209 Acute bronchitis, unspecified: Secondary | ICD-10-CM | POA: Diagnosis not present

## 2014-11-23 DIAGNOSIS — R000 Tachycardia, unspecified: Secondary | ICD-10-CM

## 2014-11-23 DIAGNOSIS — Z720 Tobacco use: Secondary | ICD-10-CM

## 2014-11-23 DIAGNOSIS — J45901 Unspecified asthma with (acute) exacerbation: Secondary | ICD-10-CM

## 2014-11-23 LAB — CBC WITH DIFFERENTIAL/PLATELET
Basophils Absolute: 0 10*3/uL (ref 0.0–0.1)
Basophils Relative: 0 % (ref 0–1)
Eosinophils Absolute: 0.1 10*3/uL (ref 0.0–0.7)
Eosinophils Relative: 2 % (ref 0–5)
HCT: 43.9 % (ref 36.0–46.0)
Hemoglobin: 14.7 g/dL (ref 12.0–15.0)
Lymphocytes Relative: 31 % (ref 12–46)
Lymphs Abs: 2.2 10*3/uL (ref 0.7–4.0)
MCH: 25.7 pg — ABNORMAL LOW (ref 26.0–34.0)
MCHC: 33.5 g/dL (ref 30.0–36.0)
MCV: 76.9 fL — ABNORMAL LOW (ref 78.0–100.0)
MPV: 8.9 fL (ref 8.6–12.4)
Monocytes Absolute: 0.6 10*3/uL (ref 0.1–1.0)
Monocytes Relative: 8 % (ref 3–12)
Neutro Abs: 4.2 10*3/uL (ref 1.7–7.7)
Neutrophils Relative %: 59 % (ref 43–77)
Platelets: 294 10*3/uL (ref 150–400)
RBC: 5.71 MIL/uL — ABNORMAL HIGH (ref 3.87–5.11)
RDW: 16.8 % — ABNORMAL HIGH (ref 11.5–15.5)
WBC: 7.2 10*3/uL (ref 4.0–10.5)

## 2014-11-23 MED ORDER — ALBUTEROL SULFATE (2.5 MG/3ML) 0.083% IN NEBU
2.5000 mg | INHALATION_SOLUTION | Freq: Once | RESPIRATORY_TRACT | Status: AC
  Administered 2014-11-23: 2.5 mg via RESPIRATORY_TRACT

## 2014-11-23 MED ORDER — IPRATROPIUM BROMIDE 0.02 % IN SOLN
0.5000 mg | Freq: Once | RESPIRATORY_TRACT | Status: AC
Start: 2014-11-23 — End: 2014-11-23
  Administered 2014-11-23: 0.5 mg via RESPIRATORY_TRACT

## 2014-11-23 MED ORDER — ACETAMINOPHEN-CODEINE #3 300-30 MG PO TABS: 1.0000 | ORAL_TABLET | ORAL | Status: DC | PRN

## 2014-11-23 MED ORDER — PREDNISONE 50 MG PO TABS: 50.0000 mg | ORAL_TABLET | Freq: Every day | ORAL | Status: DC

## 2014-11-23 NOTE — Patient Instructions (Signed)

## 2014-11-23 NOTE — Progress Notes (Signed)
  Subjective:     Kimberly [name] is a 44 y.o. female here for evaluation of a cough. Onset of symptoms was 9 days ago. Symptoms have been gradually worsening since that time. The cough is productive of yellow sputum and is aggravated by  cigarette smoke. Associated symptoms include: change in voice, chills, fever and wheezing. Patient does have a history of asthma. Patient does not have a history of environmental allergens. Patient has not traveled recently. Patient does have a history of smoking. Patient has not had a previous chest x-ray. Patient has not had a PPD done.  She smokes 1/2 ppd and is having worsening wheezing using her albuterol inhaler q 4-6 hours.  Also c/o fever up to 101 F about 3 days ago along with productive yellow sputum.    The following portions of the patient's history were reviewed and updated as appropriate: allergies, current medications, past family history, past medical history, past social history, past surgical history and problem list.  Review of Systems Pertinent items are noted in HPI.    Objective:    Oxygen saturation 96% on room air BP 126/73 mmHg  Pulse 118  Temp(Src) 98.1 F (36.7 C) (Oral)  Ht 5\' 8"  (1.727 m)  Wt 276 lb 14.4 oz (125.601 kg)  BMI 42.11 kg/m2 General appearance: alert, cooperative and mild distress Head: Normocephalic, without obvious abnormality, atraumatic Neck: no adenopathy Lungs: Diffuse b/l expiratory wheezes, no focal rales/crackles present.  No increased WOB or accessory muscle use Heart: tachycardic, regular rhythm, no murmurs appreciated     Assessment:    1) Asthma exacerbation   2) Acute Bronchitis 3) Mild respiratory distress 4) Tachycardia  5) Tobacco Abuse    Plan:   1) Duoneb in the office and reassess after tx 2) CXR and CBC to r/o concurrent infectious process such as PNA.  Consider Azithro or doxy if CAP present on CXR.  3) Tachycardic on exam, but could be related to recent albuterol use.  No  hypoxia/hypoxemia with resting O2 at 96% on RA.  Highly doubt PE as other cause very likely with bronchitis/asthma exacerbation, possible PNA.  Well's of 0. 4) Burst of high dose steroids 5) Discussed smoking cessation 6) Tylenol #3 for cough  7) F/U in 2-3 days for reassessment

## 2014-11-23 NOTE — Addendum Note (Signed)
Addended by: Katharina Caper, APRIL D on: 11/23/2014 12:28 PM   Modules accepted: Orders

## 2014-11-26 ENCOUNTER — Ambulatory Visit: Payer: Commercial Managed Care - HMO | Admitting: Family Medicine

## 2014-11-26 ENCOUNTER — Encounter: Payer: Self-pay | Admitting: Radiation Oncology

## 2014-11-26 ENCOUNTER — Ambulatory Visit
Admission: RE | Admit: 2014-11-26 | Discharge: 2014-11-26 | Disposition: A | Discharge status: Home / Self Care | Payer: Commercial Managed Care - HMO | Source: Ambulatory Visit | Attending: Radiation Oncology | Admitting: Radiation Oncology

## 2014-11-26 VITALS — BP 117/67 | HR 115 | Temp 98.1°F | Resp 20 | Wt 276.0 lb

## 2014-11-26 DIAGNOSIS — C50412 Malignant neoplasm of upper-outer quadrant of left female breast: Secondary | ICD-10-CM

## 2014-11-26 NOTE — Progress Notes (Signed)
Radiation Oncology         [phone] ________________________________  Name: Kimberly [name] MRN: [id number]  Date: 11/26/2014  DOB: Jul 15, 1971  Follow-Up Visit Note  Outpatient  CC: Diona Fanti, DO  Magrinat, Virgie Dad, MD  Diagnosis and Prior Radiotherapy:    ICD-9-CM ICD-10-CM   1. 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 UOQ  Indication for treatment:  curative      Radiation treatment dates:   08/31/2014-10/21/2014  Site/dose:   Site/dose:   1) Left Breast / 50.4 Gy in 28 fractions 2) Left supraclavicular / 50.4 Gy in 28 fractions 3) Left Posterior axillary boost  / 9.52 Gy in 28 fractions 4) Left Breast Boost / 10 Gy in 5 fractions   Narrative:  The patient returns today for routine follow-up.  Left breast has healed well.                               ALLERGIES:  has No Known Allergies.  Meds: Current Outpatient Prescriptions  Medication Sig Dispense Refill  . acetaminophen-codeine (TYLENOL #3) 300-30 MG per tablet Take 1-2 tablets by mouth every 4 (four) hours as needed (cough). 30 tablet 0  . albuterol (PROVENTIL HFA;VENTOLIN HFA) 108 (90 BASE) MCG/ACT inhaler Inhale 2 puffs into the lungs every 6 (six) hours as needed for wheezing or shortness of breath. 1 Inhaler 2  . citalopram (CELEXA) 40 MG tablet Take 40 mg by mouth daily.    . clonazePAM (KLONOPIN) 1 MG tablet daily.    Marland Kitchen dexamethasone (DECADRON) 4 MG tablet     . ferrous sulfate 325 (65 FE) MG tablet Take 1 tablet (325 mg total) by mouth daily with breakfast. 90 tablet 3  . lamoTRIgine (LAMICTAL) 200 MG tablet Take 200 mg by mouth 2 (two) times daily.    Marland Kitchen lidocaine-prilocaine (EMLA) cream     . LORazepam (ATIVAN) 0.5 MG tablet     . naproxen (NAPROSYN) 500 MG tablet Take 1 tablet (500 mg total) by mouth 2 (two) times daily with a meal. 30 tablet 0  .  ondansetron (ZOFRAN) 8 MG tablet     . prazosin (MINIPRESS) 2 MG capsule     . predniSONE (DELTASONE) 50 MG tablet Take 1 tablet (50 mg total) by mouth daily with breakfast. 5 tablet 0  . QUEtiapine (SEROQUEL) 200 MG tablet Take 1 tablet (200 mg total) by mouth 2 (two) times daily. 60 tablet 5  . risperiDONE (RISPERDAL) 3 MG tablet Take 3 mg by mouth daily.     No current facility-administered medications for this encounter.    Physical Findings: The patient is in no acute distress. Patient is alert and oriented.  weight is 276 lb (125.193 kg). Her temperature is 98.1 F (36.7 C). Her blood pressure is 117/67 and her pulse is 115. Her respiration is 20. .   Skin over left breast looks excellent with mild hyperpigmentation. Smooth, minimal dryness.   Lab Findings: Lab Results  Component Value Date   WBC 7.2 11/23/2014   HGB 14.7 11/23/2014   HCT 43.9 11/23/2014   MCV 76.9* 11/23/2014   PLT 294 11/23/2014    Radiographic Findings: No results found.  Impression/Plan:  I encouraged her to apply lotion BID to breast for further healing over 1-2  months.  She will continue followup with medical oncology. I will see her back on an as-needed basis. I have encouraged her to call if she has any issues or concerns in the future. I wished her the very best.   _____________________________________   [name], MD

## 2014-11-26 NOTE — Progress Notes (Signed)
Patient reports "generalized body aches". She was seen by her PCP on Monday, dx with bronchitis, asthma. She is on Prednisone dose pack, Tylenol/Codeine for cough. She continues to smoke, she states 1 pack every 2 days. She states the skin of her left breast treatment area is healed. She is fatigued due to bronchitis. She has FU with Dr Jana Hakim on 11/29/14.

## 2014-11-29 ENCOUNTER — Ambulatory Visit: Payer: Commercial Managed Care - HMO | Admitting: Oncology

## 2014-11-29 ENCOUNTER — Other Ambulatory Visit: Payer: Self-pay

## 2014-11-29 NOTE — Progress Notes (Signed)
No show

## 2014-11-30 ENCOUNTER — Emergency Department (INDEPENDENT_AMBULATORY_CARE_PROVIDER_SITE_OTHER): Payer: Commercial Managed Care - HMO

## 2014-11-30 ENCOUNTER — Emergency Department (INDEPENDENT_AMBULATORY_CARE_PROVIDER_SITE_OTHER)
Admission: EM | Admit: 2014-11-30 | Discharge: 2014-11-30 | Disposition: A | Discharge status: Home / Self Care | Payer: Commercial Managed Care - HMO | Source: Home / Self Care | Attending: Emergency Medicine | Admitting: Emergency Medicine

## 2014-11-30 ENCOUNTER — Encounter (HOSPITAL_COMMUNITY): Payer: Self-pay | Admitting: Emergency Medicine

## 2014-11-30 DIAGNOSIS — J209 Acute bronchitis, unspecified: Secondary | ICD-10-CM | POA: Diagnosis not present

## 2014-11-30 DIAGNOSIS — R05 Cough: Secondary | ICD-10-CM | POA: Diagnosis not present

## 2014-11-30 DIAGNOSIS — R0602 Shortness of breath: Secondary | ICD-10-CM | POA: Diagnosis not present

## 2014-11-30 MED ORDER — IPRATROPIUM-ALBUTEROL 0.5-2.5 (3) MG/3ML IN SOLN
RESPIRATORY_TRACT | Status: AC
  Filled 2014-11-30: qty 3

## 2014-11-30 MED ORDER — PREDNISONE 50 MG PO TABS: 50.0000 mg | ORAL_TABLET | Freq: Every day | ORAL | Status: DC

## 2014-11-30 MED ORDER — IPRATROPIUM BROMIDE 0.06 % NA SOLN: 2.0000 | Freq: Four times a day (QID) | NASAL | Status: DC

## 2014-11-30 MED ORDER — HYDROCODONE-HOMATROPINE 5-1.5 MG/5ML PO SYRP: 5.0000 mL | ORAL_SOLUTION | Freq: Four times a day (QID) | ORAL | Status: DC | PRN

## 2014-11-30 MED ORDER — IPRATROPIUM-ALBUTEROL 0.5-2.5 (3) MG/3ML IN SOLN
3.0000 mL | Freq: Once | RESPIRATORY_TRACT | Status: AC
  Administered 2014-11-30: 3 mL via RESPIRATORY_TRACT

## 2014-11-30 MED ORDER — DOXYCYCLINE HYCLATE 100 MG PO CAPS: 100.0000 mg | ORAL_CAPSULE | Freq: Two times a day (BID) | ORAL | Status: DC

## 2014-11-30 NOTE — ED Notes (Signed)
PATIENT MOVING FROM HER TREATMENT ROOM TO HUSBANDS TREATMENT ROOM AND BACK TO HER TREATMENT ROOM

## 2014-11-30 NOTE — ED Provider Notes (Signed)
CSN: [id number]     Arrival date & time 11/30/14  0903 History   None    Chief Complaint  Patient presents with  . URI   (Consider location/radiation/quality/duration/timing/severity/associated sxs/prior Treatment) HPI She is a 44 year old woman here for evaluation of cough. Her symptoms started about 2 weeks ago. They included runny nose, nasal congestion, cough, shortness of breath, wheezing. She was seen at her PCPs office 1 week ago and treated with prednisone and Tylenol 3 for cough. A chest x-ray was ordered, but never done. She states the medications have not helped at all. She has been using her albuterol inhaler every 3 hours with minimal temporary improvement. She reports hot and cold chills, but has not taken her temperature. No nausea or vomiting.  She states she got sick after being exposed to her son was sick with a cold.  She has just completed a course of radiation for stage II breast cancer.  Past Medical History  Diagnosis Date  . Anemia   . Arthritis   . Bipolar 1 disorder   . Blood transfusion without reported diagnosis 2007    post bleeding childbirth  . Cancer     eval  lt breast cancer  . Heart murmur     as a child-not adult-no cardiac work up  . Diabetes mellitus without complication     gestational  . History of radiation therapy 08/31/14-10/21/14    left breast/ left supraclavicular 50.4 Gy 28 fx, lef tposterior axillary boost 9.52 Gy 28 fx, left rbeast boost/ 10 Gy 5 fx   Past Surgical History  Procedure Laterality Date  . Tonsillectomy and adenoidectomy    . Dilation and curettage of uterus      after childbirth  . Multiple tooth extractions      only 5 left  . Breast biopsy Left 04/01/14  . Breast biopsy Right 04/19/14  . Breast lumpectomy with axillary lymph node dissection Bilateral 05/03/14  . Portacath placement Right 06/18/2014    Procedure: INSERTION PORT-A-CATH;  Surgeon: [name], MD;  Location: [facility];  Service:  General;  Laterality: Right;   No family history on file. History  Substance Use Topics  . Smoking status: Current Every Day Smoker -- 1.00 packs/day for 32 years    Types: Cigarettes  . Smokeless tobacco: Never Used     Comment: 11/26/14 smoking 1 pack every 2 days  . Alcohol Use: No   OB History    No data available     Review of Systems  Constitutional: Positive for fever, chills and fatigue.  HENT: Positive for congestion, rhinorrhea and sore throat.   Respiratory: Positive for cough, shortness of breath and wheezing.   Cardiovascular: Negative for chest pain.  Gastrointestinal: Negative for nausea, vomiting, abdominal pain and diarrhea.  Musculoskeletal: Negative for myalgias.  Neurological: Negative for headaches.    Allergies  Review of patient's allergies indicates no known allergies.  Home Medications   Prior to Admission medications   Medication Sig Start Date End Date Taking? Authorizing Provider  acetaminophen-codeine (TYLENOL #3) 300-30 MG per tablet Take 1-2 tablets by mouth every 4 (four) hours as needed (cough). 11/23/14   Tamela Oddi Hess, DO  albuterol (PROVENTIL HFA;VENTOLIN HFA) 108 (90 BASE) MCG/ACT inhaler Inhale 2 puffs into the lungs every 6 (six) hours as needed for wheezing or shortness of breath. 07/16/14   Minette Headland, NP  citalopram (CELEXA) 40 MG tablet Take 40 mg by mouth daily.    Historical  Provider, MD  clonazePAM (KLONOPIN) 1 MG tablet daily. 03/21/14   Historical Provider, MD  dexamethasone (DECADRON) 4 MG tablet  09/08/14   Historical Provider, MD  doxycycline (VIBRAMYCIN) 100 MG capsule Take 1 capsule (100 mg total) by mouth 2 (two) times daily. 11/30/14   Melony Overly, MD  ferrous sulfate 325 (65 FE) MG tablet Take 1 tablet (325 mg total) by mouth daily with breakfast. 02/25/14   Montez Morita, MD  HYDROcodone-homatropine (HYCODAN) 5-1.5 MG/5ML syrup Take 5 mLs by mouth every 6 (six) hours as needed for cough. 11/30/14   Melony Overly, MD   ipratropium (ATROVENT) 0.06 % nasal spray Place 2 sprays into both nostrils 4 (four) times daily. 11/30/14   Melony Overly, MD  lamoTRIgine (LAMICTAL) 200 MG tablet Take 200 mg by mouth 2 (two) times daily.    Historical Provider, MD  lidocaine-prilocaine (EMLA) cream  07/02/14   Historical Provider, MD  LORazepam (ATIVAN) 0.5 MG tablet  07/02/14   Historical Provider, MD  naproxen (NAPROSYN) 500 MG tablet Take 1 tablet (500 mg total) by mouth 2 (two) times daily with a meal. 07/13/14   Dimas Chyle, MD  ondansetron [facility]) 8 MG tablet  07/02/14   Historical Provider, MD  prazosin (MINIPRESS) 2 MG capsule  07/01/14   Historical Provider, MD  predniSONE (DELTASONE) 50 MG tablet Take 1 tablet (50 mg total) by mouth daily with breakfast. 11/30/14   Melony Overly, MD  QUEtiapine (SEROQUEL) 200 MG tablet Take 1 tablet (200 mg total) by mouth 2 (two) times daily. 09/15/14   [name], DO  risperiDONE (RISPERDAL) 3 MG tablet Take 3 mg by mouth daily. 08/11/14   Historical Provider, MD   BP 129/86 mmHg  Pulse 119  Temp(Src) 97.7 F (36.5 C) (Oral)  Resp 24  SpO2 97%  LMP 02/03/2014 (Approximate) Physical Exam  Constitutional: She is oriented to person, place, and time. She appears well-developed and well-nourished. She appears distressed.  HENT:  Head: Normocephalic and atraumatic.  Lips are dry, but she is mouth breathing.  Neck: Neck supple.  Cardiovascular: Regular rhythm and normal heart sounds.  Tachycardia present.   No murmur heard. Heart sounds somewhat difficult to hear due to adventitious breath sounds.  Pulmonary/Chest: She is in respiratory distress (mild tachypnea. Able to speak in full sentences but breathing heavily.). She has wheezes.  Diffuse rhonchi and coarse breath sounds.  Neurological: She is alert and oriented to person, place, and time.    ED Course  Procedures (including critical care time) Labs Review Labs Reviewed - No data to display  Imaging Review Dg Chest 2  View  11/30/2014   CLINICAL DATA:  One week history of cough and shortness of breath  EXAM: CHEST  2 VIEW  COMPARISON:  July 10, 2014  FINDINGS: Port-A-Cath tip is in the superior vena cava. No pneumothorax. Lungs are clear. Heart size and pulmonary vascularity are normal. No adenopathy.  IMPRESSION: No edema or consolidation.   Electronically Signed   By: Lowella Grip III M.D.   On: 11/30/2014 10:55     MDM   1. Acute bronchitis, unspecified organism    DuoNeb given. Wheezing is improved. Rhonchi persist.  Chest x-ray negative for pneumonia. We'll treat bronchitis with repeat steroid burst, doxycycline. Atrovent for nasal congestion. Hycodan cough syrup for cough. Follow-up as needed.    Melony Overly, MD 11/30/14 [phone]

## 2014-11-30 NOTE — ED Notes (Signed)
REPORTS BREATHING BETTER, STILL NOT FEELING ANY BETTER.  SPOUSE IS IN DEPARTMENT BEING SEEN

## 2014-11-30 NOTE — Discharge Instructions (Signed)
You have bronchitis. We're going to repeat the 5 day course of prednisone. Take doxycycline 1 pill twice a day for 10 days. Use the Hycodan cough syrup every 4 hours as needed for cough. Do not drive while taking this medicine. Use Atrovent nasal spray 4 times a day to help relieve your congestion. You should see improvement in the next 2-3 days. Follow-up as needed.

## 2014-11-30 NOTE — ED Notes (Signed)
Uri, cough for 3 weeks.  Saw her pcp on last week and was started on prednisone and narcotic cough medicine.  Reports she is no better.  Patient rates situation as worse

## 2014-12-01 ENCOUNTER — Telehealth: Payer: Self-pay | Admitting: Oncology

## 2014-12-01 NOTE — Telephone Encounter (Signed)
pt cld to r/s appt-gave pt time & date

## 2014-12-10 ENCOUNTER — Other Ambulatory Visit: Payer: Self-pay | Admitting: *Deleted

## 2014-12-10 DIAGNOSIS — C50412 Malignant neoplasm of upper-outer quadrant of left female breast: Secondary | ICD-10-CM

## 2014-12-13 ENCOUNTER — Ambulatory Visit: Payer: Commercial Managed Care - HMO | Admitting: Oncology

## 2014-12-13 ENCOUNTER — Other Ambulatory Visit: Payer: Commercial Managed Care - HMO

## 2014-12-16 ENCOUNTER — Other Ambulatory Visit: Payer: Self-pay | Admitting: Nurse Practitioner

## 2014-12-21 ENCOUNTER — Telehealth: Payer: Self-pay | Admitting: Oncology

## 2014-12-21 NOTE — Telephone Encounter (Signed)
pt cld lefdt a vm needing appt w/GM-stated missed last 2 appts-gave pt appt time & date-pt understood

## 2015-01-19 ENCOUNTER — Other Ambulatory Visit (HOSPITAL_BASED_OUTPATIENT_CLINIC_OR_DEPARTMENT_OTHER): Payer: Commercial Managed Care - HMO

## 2015-01-19 ENCOUNTER — Ambulatory Visit (HOSPITAL_BASED_OUTPATIENT_CLINIC_OR_DEPARTMENT_OTHER): Payer: Commercial Managed Care - HMO | Admitting: Oncology

## 2015-01-19 VITALS — BP 127/73 | HR 98 | Temp 98.3°F | Resp 18 | Ht 68.0 in | Wt 287.7 lb

## 2015-01-19 DIAGNOSIS — M79606 Pain in leg, unspecified: Secondary | ICD-10-CM

## 2015-01-19 DIAGNOSIS — M545 Low back pain: Secondary | ICD-10-CM

## 2015-01-19 DIAGNOSIS — C50412 Malignant neoplasm of upper-outer quadrant of left female breast: Secondary | ICD-10-CM | POA: Diagnosis not present

## 2015-01-19 DIAGNOSIS — Z720 Tobacco use: Secondary | ICD-10-CM

## 2015-01-19 DIAGNOSIS — F3160 Bipolar disorder, current episode mixed, unspecified: Secondary | ICD-10-CM

## 2015-01-19 DIAGNOSIS — F17200 Nicotine dependence, unspecified, uncomplicated: Secondary | ICD-10-CM

## 2015-01-19 LAB — COMPREHENSIVE METABOLIC PANEL (CC13)
ALT: 13 U/L (ref 0–55)
AST: 14 U/L (ref 5–34)
Albumin: 3.9 g/dL (ref 3.5–5.0)
Alkaline Phosphatase: 82 U/L (ref 40–150)
Anion Gap: 13 mEq/L — ABNORMAL HIGH (ref 3–11)
BUN: 8.6 mg/dL (ref 7.0–26.0)
CO2: 23 mEq/L (ref 22–29)
Calcium: 9.3 mg/dL (ref 8.4–10.4)
Chloride: 103 mEq/L (ref 98–109)
Creatinine: 1.1 mg/dL (ref 0.6–1.1)
EGFR: 71 mL/min/{1.73_m2} — ABNORMAL LOW (ref >90)
Glucose: 93 mg/dl (ref 70–140)
Potassium: 3.8 mEq/L (ref 3.5–5.1)
Sodium: 139 mEq/L (ref 136–145)
Total Bilirubin: 0.35 mg/dL (ref 0.20–1.20)
Total Protein: 7.3 g/dL (ref 6.4–8.3)

## 2015-01-19 LAB — CBC WITH DIFFERENTIAL/PLATELET
BASO%: 0.7 % (ref 0.0–2.0)
Basophils Absolute: 0 10*3/uL (ref 0.0–0.1)
EOS%: 0.4 % (ref 0.0–7.0)
Eosinophils Absolute: 0 10*3/uL (ref 0.0–0.5)
HCT: 44.9 % (ref 34.8–46.6)
HGB: 14.4 g/dL (ref 11.6–15.9)
LYMPH%: 26.5 % (ref 14.0–49.7)
MCH: 25.6 pg (ref 25.1–34.0)
MCHC: 32.1 g/dL (ref 31.5–36.0)
MCV: 79.9 fL (ref 79.5–101.0)
MONO#: 0.6 10*3/uL (ref 0.1–0.9)
MONO%: 12.6 % (ref 0.0–14.0)
NEUT#: 2.9 10*3/uL (ref 1.5–6.5)
NEUT%: 59.8 % (ref 38.4–76.8)
Platelets: 238 10*3/uL (ref 145–400)
RBC: 5.62 10*6/uL — ABNORMAL HIGH (ref 3.70–5.45)
RDW: 17.3 % — ABNORMAL HIGH (ref 11.2–14.5)
WBC: 4.9 10*3/uL (ref 3.9–10.3)
lymph#: 1.3 10*3/uL (ref 0.9–3.3)

## 2015-01-19 MED ORDER — ANASTROZOLE 1 MG PO TABS: 1.0000 mg | ORAL_TABLET | Freq: Every day | ORAL | Status: DC

## 2015-01-19 MED ORDER — OMEPRAZOLE 40 MG PO CPDR: 40.0000 mg | DELAYED_RELEASE_CAPSULE | Freq: Every day | ORAL | Status: DC

## 2015-01-19 MED ORDER — NAPROXEN 500 MG PO TABS
500.0000 mg | ORAL_TABLET | Freq: Two times a day (BID) | ORAL | Status: DC
Start: 2015-01-19 — End: 2015-10-21

## 2015-01-19 NOTE — Progress Notes (Signed)
[place]  [phone] [phone]     ID: Kimberly [name] DOB: Oct 18, 1971  MR#: [id number]  TUU#:[id number]  Patient Care Team: [name], DO as PCP - General [name], MD as Consulting Physician (General Surgery) [name], MD as Consulting Physician (Oncology) [name], MD as Attending Physician (Radiation Oncology) [name]  CHIEF COMPLAINT: Estrogen receptor positive breast cancer  CURRENT TREATMENT: Anastrozole   BREAST CANCER HISTORY: From the original intake note:  The patient had bilateral screening mammography with tomography 03/19/2014. This was the patient's first ever mammography. Showed a possible mass in the left breast. Left diagnostic mammography and ultrasonography 04/01/2014 showed an irregular mass in the upper outer quadrant of the left breast with a possible satellite 1 cm anterior to it. On physical exam there was a firm palpable mass at the 1:00 position of the left breast 8 cm from the nipple. There was no palpable left axillary adenopathy. Ultrasound showed an irregular hypoechoic mass in the area in question measuring 1.4 cm. There was a 4 mm nodule located anterior to this. Ultrasound of the left axilla was benign.  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 The second mass was negative for malignancy. This was felt to be concordant.  On 04/08/2014 the patient underwent bilateral breast MRI. This showed a 9 mm enhancing mass in the subareolar area of the right breast in in the left breast, the previously noted mass measuring 1.3 cm. There were no morphologically abnormal lymph nodes  The  right breast finding was followed up on 04/19/2014 with ultrasound which showed an intraductal soft tissue mass in the inferior subareolar worsening of the right breast measuring 8 mm. This was biopsied 04/19/2014 and showed ([id number]) ectatic duct, with no evidence of malignancy. Surgical excision was recommended.  Accordingly on 05/03/2014 the patient underwent right lumpectomy, showing an intraductal papilloma, with known malignancy identified. Left lumpectomy on the same day showed an invasive ductal carcinoma measuring 1.5 cm, with one of the 2 sentinel lymph nodes positive for carcinoma. There was no extracapsular extension. Margins were clear and ample. HER-2 was repeated and was again negative.  The patient's case was discussed in the multidisciplinary breast cancer conference 05/12/2014. An Oncotype had been previously requested and this showed a recurrent score of 22, in the intermediate range. The patient qualifies for the S1007 study and this will be discussed with her. Otherwise the standard recommendation would be chemotherapy followed by radiation followed by anti-estrogens.  The patient's subsequent history is as detailed below.  INTERVAL HISTORY: Kimberly [name] returns today for follow-up of her breast cancer accompanied by her husband Kimberly [name]. Since her last visit here she completed her radiation treatments. That was late December. She was supposed to have seen [name] February 8 and February 22, but did not show. She is here today to discuss starting antiestrogen therapy.  REVIEW OF SYSTEMS:   She feels severely fatigued. She can't get anything done. She just has no energy. She sleeps very poorly. In addition she has significant pain. This is mostly in her back and legs, but sometimes she says it hurts all over. She thinks she may have fibromyalgia. She feels weak  and numb and forgetful. She admits to anxiety and depression. [facility] She had an episode of bronchitis earlier this year and was prescribed a variety of supportive medicines as well as doxycycline. Unfortunately she continues to smoke. A detailed review of systems today was otherwise stable.   PAST MEDICAL HISTORY: Past Medical History  Diagnosis Date  . Anemia   . Arthritis   . Bipolar 1 disorder   . Blood transfusion without reported diagnosis 2007    post bleeding childbirth  . Cancer     eval  lt breast cancer  . Heart murmur     as a child-not adult-no cardiac work up  . Diabetes mellitus without complication     gestational  . History of radiation therapy 08/31/14-10/21/14    left breast/ left supraclavicular 50.4 Gy 28 fx, lef tposterior axillary boost 9.52 Gy 28 fx, left rbeast boost/ 10 Gy 5 fx    PAST SURGICAL HISTORY: Past Surgical History  Procedure Laterality Date  . Tonsillectomy and adenoidectomy    . Dilation and curettage of uterus      after childbirth  . Multiple tooth extractions      only 5 left  . Breast biopsy Left 04/01/14  . Breast biopsy Right 04/19/14  . Breast lumpectomy with axillary lymph node dissection Bilateral 05/03/14  . Portacath placement Right 06/18/2014    Procedure: INSERTION PORT-A-CATH;  Surgeon: [name], MD;  Location: [facility];  Service: General;  Laterality: Right;    FAMILY HISTORY No family history on file. The patient's father is currently living at age 60. The patient's mother was murdered at age 47 by the patient's mother's sister. The patient has 2 brothers, one sister. The patient's father hase prostate cancer, diagnosed at age 28. There is no history of breast or ovarian cancer in the family.  GYNECOLOGIC HISTORY:  No LMP recorded. Patient is not currently having periods (Reason: Irregular Periods). Menarche age 39, first live birth age 49, the patient is [place] P5. The patient has not had a period in the last 2 months but tells me she has tested for pregnancy x2  and she is not pregnant.  SOCIAL HISTORY:   the patient tells me she is disabled secondary to a nervous breakdown. She is currently in her third marriage. Her husband, Kimberly [name], is retired from [employer]. He is disabled secondary to seizures. The patient's children are [name] and [name], age 68 and 17, [name] age 49,  and [place] and [name] age 92 and 60. In addition a nephew, [name], 72, lives with her family.     ADVANCED DIRECTIVES:  not in place    HEALTH MAINTENANCE: History  Substance Use Topics  . Smoking status: Current Every Day Smoker -- 1.00 packs/day for 32 years    Types: Cigarettes  . Smokeless tobacco: Never Used     Comment: 11/26/14 smoking 1 pack every 2 days  . Alcohol Use: No     Colonoscopy:  PAP:  Bone density:  Lipid panel:  No Known Allergies  Current Outpatient Prescriptions  Medication Sig Dispense Refill  . acetaminophen-codeine (TYLENOL #3) 300-30 MG per tablet Take 1-2 tablets by mouth every 4 (four) hours as needed (cough). 30 tablet 0  . albuterol (PROVENTIL HFA;VENTOLIN HFA) 108 (90 BASE) MCG/ACT inhaler Inhale 2 puffs into the lungs every 6 (six) hours as needed for wheezing or shortness of breath. 1 Inhaler  2  . citalopram (CELEXA) 40 MG tablet Take 40 mg by mouth daily.    . clonazePAM (KLONOPIN) 1 MG tablet daily.    Marland Kitchen dexamethasone (DECADRON) 4 MG tablet     . doxycycline (VIBRAMYCIN) 100 MG capsule Take 1 capsule (100 mg total) by mouth 2 (two) times daily. 20 capsule 0  . ferrous sulfate 325 (65 FE) MG tablet Take 1 tablet (325 mg total) by mouth daily with breakfast. 90 tablet 3  . HYDROcodone-homatropine (HYCODAN) 5-1.5 MG/5ML syrup Take 5 mLs by mouth every 6 (six) hours as needed for cough. 120 mL 0  . ipratropium (ATROVENT) 0.06 % nasal spray Place 2 sprays into both nostrils 4 (four) times daily. 15 mL 0  . lamoTRIgine (LAMICTAL) 200 MG tablet Take 200 mg by mouth 2 (two) times daily.    Marland Kitchen lidocaine-prilocaine  (EMLA) cream     . LORazepam (ATIVAN) 0.5 MG tablet     . naproxen (NAPROSYN) 500 MG tablet Take 1 tablet (500 mg total) by mouth 2 (two) times daily with a meal. 30 tablet 0  . ondansetron (ZOFRAN) 8 MG tablet     . prazosin (MINIPRESS) 2 MG capsule     . predniSONE (DELTASONE) 50 MG tablet Take 1 tablet (50 mg total) by mouth daily with breakfast. 5 tablet 0  . QUEtiapine (SEROQUEL) 200 MG tablet Take 1 tablet (200 mg total) by mouth 2 (two) times daily. 60 tablet 5  . risperiDONE (RISPERDAL) 3 MG tablet Take 3 mg by mouth daily.     No current facility-administered medications for this visit.    OBJECTIVE: middle-aged [place] woman who appears older than stated age 78 Vitals:   01/19/15 1436  BP: 127/73  Pulse: 98  Temp: 98.3 F (36.8 C)  Resp: 18     Body mass index is 43.75 kg/(m^2).    ECOG FS:2 - Symptomatic, <50% confined to bed  Sclerae unicteric, EOMs intact Oropharynx clear, slightly dry No cervical or supraclavicular adenopathy Lungs no rales or rhonchi Heart regular rate and rhythm Abd soft, obese, nontender, positive bowel sounds MSK no focal spinal tenderness to moderate palpation Neuro: nonfocal, well oriented, discouraged affect Breasts: The right breast is unremarkable. The left breast is status post lumpectomy and radiation. There remains mild hyperpigmentation. There is no evidence of local recurrence. The left axilla is benign.  LAB RESULTS:  CMP     Component Value Date/Time   NA 139 07/16/2014 1119   NA 140 07/13/2014 0543   K 4.1 07/16/2014 1119   K 4.0 07/13/2014 0543   CL 104 07/13/2014 0543   CO2 23 07/16/2014 1119   CO2 25 07/13/2014 0543   GLUCOSE 98 07/16/2014 1119   GLUCOSE 84 07/13/2014 0543   BUN 12.0 07/16/2014 1119   BUN 5* 07/13/2014 0543   CREATININE 1.1 07/16/2014 1119   CREATININE 0.94 07/13/2014 0543   CREATININE 0.94 02/25/2014 0926   CALCIUM 10.1 07/16/2014 1119   CALCIUM 8.7 07/13/2014 0543   PROT 7.7 07/16/2014 1119    PROT 7.3 07/10/2014 1850   ALBUMIN 3.9 07/16/2014 1119   ALBUMIN 3.8 07/10/2014 1850   AST 16 07/16/2014 1119   AST 17 07/10/2014 1850   ALT 11 07/16/2014 1119   ALT 18 07/10/2014 1850   ALKPHOS 86 07/16/2014 1119   ALKPHOS 103 07/10/2014 1850   BILITOT 0.20 07/16/2014 1119   BILITOT 0.4 07/10/2014 1850   GFRNONAA 74* 07/13/2014 0543   GFRAA 86* 07/13/2014 0543  I No results found for: SPEP  Lab Results  Component Value Date   WBC 4.9 01/19/2015   NEUTROABS 2.9 01/19/2015   HGB 14.4 01/19/2015   HCT 44.9 01/19/2015   MCV 79.9 01/19/2015   PLT 238 01/19/2015      Chemistry      Component Value Date/Time   NA 139 07/16/2014 1119   NA 140 07/13/2014 0543   K 4.1 07/16/2014 1119   K 4.0 07/13/2014 0543   CL 104 07/13/2014 0543   CO2 23 07/16/2014 1119   CO2 25 07/13/2014 0543   BUN 12.0 07/16/2014 1119   BUN 5* 07/13/2014 0543   CREATININE 1.1 07/16/2014 1119   CREATININE 0.94 07/13/2014 0543   CREATININE 0.94 02/25/2014 0926      Component Value Date/Time   CALCIUM 10.1 07/16/2014 1119   CALCIUM 8.7 07/13/2014 0543   ALKPHOS 86 07/16/2014 1119   ALKPHOS 103 07/10/2014 1850   AST 16 07/16/2014 1119   AST 17 07/10/2014 1850   ALT 11 07/16/2014 1119   ALT 18 07/10/2014 1850   BILITOT 0.20 07/16/2014 1119   BILITOT 0.4 07/10/2014 1850       Lab Results  Component Value Date   LABCA2 28 04/30/2014    No components found for: EHUDJ497  No results for input(s): INR in the last 168 hours.  Urinalysis    Component Value Date/Time   COLORURINE YELLOW 07/10/2014 1951   APPEARANCEUR CLEAR 07/10/2014 1951   LABSPEC 1.017 07/10/2014 1951   PHURINE 6.0 07/10/2014 1951   GLUCOSEU NEGATIVE 07/10/2014 1951   HGBUR NEGATIVE 07/10/2014 1951   BILIRUBINUR NEGATIVE 07/10/2014 1951   KETONESUR NEGATIVE 07/10/2014 1951   PROTEINUR NEGATIVE 07/10/2014 1951   UROBILINOGEN 0.2 07/10/2014 1951   NITRITE NEGATIVE 07/10/2014 1951   LEUKOCYTESUR TRACE* 07/10/2014  1951    STUDIES: No results found.   ASSESSMENT: 44 y.o. BRCA negative [place] woman status post left lumpectomy and sentinel lymph node sampling 05/03/2014 for a pT1c pN0, stage IA invasive ductal carcinoma, grade 2, estrogen receptor 83% positive, progesterone receptor 66% positive, with an MIB-1 of 14% and no HER-2 amplification.  (1) Oncotype score of 22 predicts a risk of outside the breast recurrence within 10 years of 13% if the patient's only systemic therapy is tamoxifen for 5 years.  (2) adjuvant chemotherapy with cyclophosphamide and docetaxel started 07/06/2014, patient tolerated chemotherapy very poorly and it was discontinued after one cycle.    (3) adjuvant radiation completed 10/21/2014 1) Left Breast / 50.4 Gy in 28 fractions 2) Left supraclavicular / 50.4 Gy in 28 fractions 3) Left Posterior axillary boost / 9.52 Gy in 28 fractions 4) Left Breast Boost / 10 Gy in 5 fractions  (4) anastrozole started 01/19/2015  PLAN: I am not sure what the cause of [name]'s disabling fatigue is, but she is 3 months out from radiation and I think we must start anti-estrogens. Given her relative inactivity and obesity I would prefer to use aromatase inhibitors. We discussed the possible toxicities side effects and complications of this agent and I went ahead and placed an order for anastrozole. She is going to return to see me in about 2 months and if she tolerates it well the plan will be to continue this for 5 years.  She had run out of Naprosyn and I went ahead and refill that for her. If she is going to be taking that chronically for her chronic back and leg pains it would be helpful  for her to start omeprazole and I went ahead and enter that prescription as well.  Otherwise she is ready to have her port removed. I am also referring her to physical therapy to see if we can improve on the range of motion of the left upper extremity. She will see [name] again in 2 months. She knows to  call for any problems that may develop before her next visit here.    [name], [place] [phone] 01/19/2015 3:03 PM

## 2015-01-20 ENCOUNTER — Telehealth: Payer: Self-pay | Admitting: *Deleted

## 2015-01-20 ENCOUNTER — Telehealth: Payer: Self-pay | Admitting: Oncology

## 2015-01-20 NOTE — Telephone Encounter (Signed)
Obtained fax number for MD dictation to be sent for pt already established with Dr Elane Fritz.

## 2015-01-20 NOTE — Telephone Encounter (Signed)
s.w. pt and advised on May and June appt....pt ok and aware °

## 2015-01-25 ENCOUNTER — Ambulatory Visit: Payer: Self-pay | Admitting: Physical Therapy

## 2015-01-28 ENCOUNTER — Ambulatory Visit: Payer: Commercial Managed Care - HMO | Attending: Oncology | Admitting: Physical Therapy

## 2015-02-07 ENCOUNTER — Ambulatory Visit: Payer: Commercial Managed Care - HMO | Admitting: Physical Therapy

## 2015-02-14 ENCOUNTER — Ambulatory Visit: Payer: Commercial Managed Care - HMO | Admitting: Obstetrics and Gynecology

## 2015-02-17 ENCOUNTER — Other Ambulatory Visit: Payer: Self-pay

## 2015-02-17 ENCOUNTER — Other Ambulatory Visit: Payer: Self-pay | Admitting: Oncology

## 2015-02-17 DIAGNOSIS — Z853 Personal history of malignant neoplasm of breast: Secondary | ICD-10-CM

## 2015-02-18 ENCOUNTER — Telehealth: Payer: Self-pay | Admitting: Obstetrics and Gynecology

## 2015-02-18 DIAGNOSIS — C50919 Malignant neoplasm of unspecified site of unspecified female breast: Secondary | ICD-10-CM

## 2015-02-18 NOTE — Telephone Encounter (Signed)
Mailed letter to patient. Kodi Guerrera,CMA

## 2015-02-18 NOTE — Telephone Encounter (Signed)
Will forward to MD. Halei Hanover,CMA  

## 2015-02-18 NOTE — Telephone Encounter (Signed)
Referral placed. I still have never met this patient please have her schedule an appointment to be seen in clinic for at least a wellness exam.

## 2015-02-18 NOTE — Telephone Encounter (Signed)
Kimberly [name] with [facility] need referral for [name]'s appt 5/4 Wed next week.  Coming in for recheck of hx of breast cancer.  Pt have [place]... Provider she's seeing is Dr. [name].  NPI# [id number].   Dx is C50.819  Please contact Kimberly [name] for any additional information or questions

## 2015-03-16 ENCOUNTER — Other Ambulatory Visit: Payer: Self-pay | Admitting: *Deleted

## 2015-03-16 DIAGNOSIS — C50412 Malignant neoplasm of upper-outer quadrant of left female breast: Secondary | ICD-10-CM

## 2015-03-17 ENCOUNTER — Other Ambulatory Visit: Payer: Commercial Managed Care - HMO

## 2015-03-24 ENCOUNTER — Telehealth: Payer: Self-pay | Admitting: Obstetrics and Gynecology

## 2015-03-24 ENCOUNTER — Ambulatory Visit: Payer: Commercial Managed Care - HMO | Admitting: Oncology

## 2015-03-24 DIAGNOSIS — C50412 Malignant neoplasm of upper-outer quadrant of left female breast: Secondary | ICD-10-CM

## 2015-03-24 NOTE — Progress Notes (Signed)
No show

## 2015-03-24 NOTE — Telephone Encounter (Signed)
[facility]:  Needs referral to see Dr Jana Hakim  icd 10 code is C50.412 Pt appt was today Can call back and leave a detailed message

## 2015-03-25 NOTE — Telephone Encounter (Signed)
Will forward to MD to place referral.  [employer] does not retro pay visits done before authorization received. Darcy Barbara,CMA

## 2015-03-25 NOTE — Telephone Encounter (Signed)
[employer] Silverback auth # [id number] 03/25/15 thru 09/21/15, 6 visits Approved.

## 2015-03-25 NOTE — Telephone Encounter (Signed)
Placed referral. Kimberly [name] try an call office to see if it can count for today's visit I would hate for patient to have this not be covered.   Thanks

## 2015-03-25 NOTE — Telephone Encounter (Signed)
Patient "no-showed" her appt with Dr. Jana Hakim on 03/24/15. I will place a [facility] Silver[employer] referral online for any additional appts she may make.

## 2015-03-30 ENCOUNTER — Telehealth: Payer: Self-pay | Admitting: *Deleted

## 2015-03-30 NOTE — Telephone Encounter (Signed)
Received referral request for an appt from [facility].  Noticed that the pt is an established pt of Dr. Virgie Dad.  Sent Anne in Scheduling an inbasket for her to schedule the pt a f/u appt w/ Dr. Jana Hakim.

## 2015-03-31 ENCOUNTER — Telehealth: Payer: Self-pay | Admitting: *Deleted

## 2015-03-31 NOTE — Telephone Encounter (Signed)
Called pt and confirmed 04/26/15 appt w/ her.  Mailed calendar to pt.

## 2015-04-25 NOTE — Progress Notes (Signed)
No show

## 2015-04-26 ENCOUNTER — Ambulatory Visit (HOSPITAL_BASED_OUTPATIENT_CLINIC_OR_DEPARTMENT_OTHER): Payer: Self-pay | Admitting: Oncology

## 2015-04-26 DIAGNOSIS — C50412 Malignant neoplasm of upper-outer quadrant of left female breast: Secondary | ICD-10-CM

## 2015-04-27 ENCOUNTER — Telehealth: Payer: Self-pay | Admitting: Oncology

## 2015-04-27 NOTE — Telephone Encounter (Signed)
Confirmed appointment for August. mailed calendar.

## 2015-05-20 ENCOUNTER — Other Ambulatory Visit: Payer: Self-pay | Admitting: *Deleted

## 2015-06-03 ENCOUNTER — Ambulatory Visit: Payer: Commercial Managed Care - HMO | Admitting: Nurse Practitioner

## 2015-06-03 ENCOUNTER — Other Ambulatory Visit: Payer: Commercial Managed Care - HMO

## 2015-07-17 ENCOUNTER — Other Ambulatory Visit: Payer: Self-pay | Admitting: Oncology

## 2015-07-25 ENCOUNTER — Other Ambulatory Visit: Payer: Self-pay | Admitting: *Deleted

## 2015-07-25 MED ORDER — LAMOTRIGINE 200 MG PO TABS: 200.0000 mg | ORAL_TABLET | Freq: Two times a day (BID) | ORAL | Status: DC

## 2015-07-25 NOTE — Telephone Encounter (Signed)
Will give 2 week supply.  Per Dr Gerarda Fraction last note patient needs office visit for further refills.  Please advise patient.

## 2015-07-25 NOTE — Telephone Encounter (Signed)
Patient made an appt for a physical for 08-24-15 at 1:45pm with pcp.  Jazmin Hartsell,CMA

## 2015-08-24 ENCOUNTER — Encounter: Payer: Commercial Managed Care - HMO | Admitting: Obstetrics and Gynecology

## 2015-08-26 ENCOUNTER — Encounter: Payer: Self-pay | Admitting: Genetic Counselor

## 2015-08-26 DIAGNOSIS — Z1379 Encounter for other screening for genetic and chromosomal anomalies: Secondary | ICD-10-CM | POA: Insufficient documentation

## 2015-09-21 ENCOUNTER — Ambulatory Visit (HOSPITAL_COMMUNITY)
Admission: RE | Admit: 2015-09-21 | Discharge: 2015-09-21 | Disposition: A | Discharge status: Home / Self Care | Payer: Commercial Managed Care - HMO | Source: Ambulatory Visit | Attending: Internal Medicine | Admitting: Internal Medicine

## 2015-09-29 ENCOUNTER — Ambulatory Visit (HOSPITAL_COMMUNITY)
Admission: RE | Admit: 2015-09-29 | Discharge: 2015-09-29 | Disposition: A | Discharge status: Home / Self Care | Payer: Commercial Managed Care - HMO | Source: Ambulatory Visit | Attending: Internal Medicine | Admitting: Internal Medicine

## 2015-09-29 ENCOUNTER — Other Ambulatory Visit: Payer: Self-pay | Admitting: Family Medicine

## 2015-09-29 ENCOUNTER — Ambulatory Visit (INDEPENDENT_AMBULATORY_CARE_PROVIDER_SITE_OTHER): Payer: Commercial Managed Care - HMO | Admitting: Family Medicine

## 2015-09-29 ENCOUNTER — Encounter: Payer: Self-pay | Admitting: Family Medicine

## 2015-09-29 VITALS — BP 138/74 | HR 114 | Temp 97.9°F | Ht 69.0 in | Wt 310.0 lb

## 2015-09-29 DIAGNOSIS — Z23 Encounter for immunization: Secondary | ICD-10-CM | POA: Diagnosis not present

## 2015-09-29 DIAGNOSIS — R079 Chest pain, unspecified: Secondary | ICD-10-CM

## 2015-09-29 DIAGNOSIS — M2550 Pain in unspecified joint: Secondary | ICD-10-CM | POA: Diagnosis not present

## 2015-09-29 DIAGNOSIS — N912 Amenorrhea, unspecified: Secondary | ICD-10-CM

## 2015-09-29 DIAGNOSIS — D509 Iron deficiency anemia, unspecified: Secondary | ICD-10-CM | POA: Diagnosis not present

## 2015-09-29 DIAGNOSIS — C50412 Malignant neoplasm of upper-outer quadrant of left female breast: Secondary | ICD-10-CM

## 2015-09-29 DIAGNOSIS — F17200 Nicotine dependence, unspecified, uncomplicated: Secondary | ICD-10-CM

## 2015-09-29 DIAGNOSIS — Z0000 Encounter for general adult medical examination without abnormal findings: Secondary | ICD-10-CM | POA: Diagnosis not present

## 2015-09-29 DIAGNOSIS — N329 Bladder disorder, unspecified: Secondary | ICD-10-CM

## 2015-09-29 DIAGNOSIS — N3946 Mixed incontinence: Secondary | ICD-10-CM

## 2015-09-29 DIAGNOSIS — F3160 Bipolar disorder, current episode mixed, unspecified: Secondary | ICD-10-CM

## 2015-09-29 LAB — TSH: TSH: 8.418 u[IU]/mL — ABNORMAL HIGH (ref 0.350–4.500)

## 2015-09-29 MED ORDER — FERROUS SULFATE 325 (65 FE) MG PO TABS: 325.0000 mg | ORAL_TABLET | Freq: Every day | ORAL | Status: DC

## 2015-09-29 MED ORDER — VARENICLINE TARTRATE 0.5 MG PO TABS: 0.5000 mg | ORAL_TABLET | Freq: Two times a day (BID) | ORAL | Status: DC

## 2015-09-29 NOTE — Progress Notes (Signed)
Subjective:    Patient ID: Kimberly [name] is a 44 y.o. female presenting with Annual Exam  on 09/29/2015  HPI: Here for annual exam. Would like to start meds to quit smoking.  She is s/p XRT and lumpectomy for breast cancer. She reports leakage of urine from bladder. She has some stabbing left sided chest pain with left arm numbness. Denies diaphoresis although did have SOB. No associated nausea.  Reports aches and pains in her body. Takes Ibuprofen, which is not helping. Reports pain in knees and back. No cycle x 5 years.  Review of Systems  Constitutional: Negative for fever and chills.  HENT: Negative for congestion, nosebleeds and rhinorrhea.   Eyes: Negative for visual disturbance.  Respiratory: Positive for chest tightness and shortness of breath.   Cardiovascular: Positive for chest pain. Negative for palpitations.  Gastrointestinal: Negative for nausea, vomiting, abdominal pain, diarrhea, constipation, blood in stool and abdominal distention.  Genitourinary: Negative for dysuria, frequency, vaginal bleeding and menstrual problem.  Musculoskeletal: Positive for myalgias, back pain and arthralgias.  Skin: Negative for rash.  Neurological: Negative for dizziness and headaches.  Psychiatric/Behavioral: Positive for dysphoric mood. Negative for behavioral problems and sleep disturbance.  All other systems reviewed and are negative.     Objective:    BP 146/89 mmHg  Pulse 114  Temp(Src) 97.9 F (36.6 C) (Oral)  Ht '5\' 9"'  (1.753 m)  Wt 310 lb (140.615 kg)  BMI 45.76 kg/m2 Physical Exam  Constitutional: She is oriented to person, place, and time. She appears well-developed and well-nourished.  HENT:  Head: Normocephalic and atraumatic.  Eyes: Pupils are equal, round, and reactive to light. No scleral icterus.  Neck: Normal range of motion. No thyromegaly present.  Cardiovascular: Normal rate, regular rhythm and intact distal pulses.   Pulmonary/Chest: Effort normal and  breath sounds normal.  Abdominal: Soft. She exhibits no distension. There is no tenderness.  Neurological: She is alert and oriented to person, place, and time.  Skin: Skin is warm and dry.  Psychiatric: Her affect is blunt. Her speech is delayed. She is slowed.   EKG shows sinus tach and non-specific T wave changes, no signs of ischemia     Assessment & Plan:   Problem List Items Addressed This Visit      Unprioritized   OBESITY, MORBID    Discussed weight loss, addition of exercise      ANEMIA, IRON DEFICIENCY, CHRONIC    Refilled her iron, although last check had normal Ferritin and Hgb.      Relevant Medications   ferrous sulfate 325 (65 FE) MG tablet   Mixed bipolar I disorder ([place])    Followed at [facility], who manages her meds.      TOBACCO ABUSE    Smoking cessation reviewed, given Chantix, to set quit date. Alternatives to smoking discussed. Health benefits discussed.      Relevant Medications   varenicline (CHANTIX) 0.5 MG tablet   Breast cancer of upper-outer quadrant of left female breast ([place])    Continue current meds. For f/u in January      Mixed incontinence    Using pads--Kegel's given, weight loss will help with this as well.       Other Visit Diagnoses    Routine medical exam    -  Primary    likely needs fasting lipids, but is not fasting today, had CBC and CMP in 3/16--WNL    Relevant Orders    Lipid panel  Encounter for immunization        Flu shot update    Amenorrhea        No cycle x 5 years--would be very early for [facility], but if not, would need endometrial sampling.    Relevant Orders    Follicle stimulating hormone    TSH    Joint ache        Multiple--suspect deconditioning, weight and degenerative disease, play a big role--will look for evidence of auto-immune issues.    Relevant Orders    Sed Rate (ESR)    Rheumatoid Factor    Antinuclear Antib (ANA)    Chest pain, unspecified chest pain type        check  EKG--she is young for CAD, but has FH, smoking and obesity--may need stress test if persists.    Relevant Orders    EKG 12-Lead       Return in about 3 months (around 12/28/2015).   Lakechia Nay S 09/29/2015 9:14 AM

## 2015-09-29 NOTE — Assessment & Plan Note (Signed)
Followed at [facility], who manages her meds.

## 2015-09-29 NOTE — Assessment & Plan Note (Signed)
Smoking cessation reviewed, given Chantix, to set quit date. Alternatives to smoking discussed. Health benefits discussed.

## 2015-09-29 NOTE — Patient Instructions (Addendum)
Preventive Care for Adults, Female A healthy lifestyle and preventive care can promote health and wellness. Preventive health guidelines for women include the following key practices. 1. A routine yearly physical is a good way to check with your health care provider about your health and preventive screening. It is a chance to share any concerns and updates on your health and to receive a thorough exam. 2. Visit your dentist for a routine exam and preventive care every 6 months. Brush your teeth twice a day and floss once a day. Good oral hygiene prevents tooth decay and gum disease. 3. The frequency of eye exams is based on your age, health, family medical history, use of contact lenses, and other factors. Follow your health care provider's recommendations for frequency of eye exams. 4. Eat a healthy diet. Foods like vegetables, fruits, whole grains, low-fat dairy products, and lean protein foods contain the nutrients you need without too many calories. Decrease your intake of foods high in solid fats, added sugars, and salt. Eat the right amount of calories for you.Get information about a proper diet from your health care provider, if necessary. 5. Regular physical exercise is one of the most important things you can do for your health. Most adults should get at least 150 minutes of moderate-intensity exercise (any activity that increases your heart rate and causes you to sweat) each week. In addition, most adults need muscle-strengthening exercises on 2 or more days a week. 6. Maintain a healthy weight. The body mass index (BMI) is a screening tool to identify possible weight problems. It provides an estimate of body fat based on height and weight. Your health care provider can find your BMI and can help you achieve or maintain a healthy weight.For adults 20 years and older: 1. A BMI below 18.5 is considered underweight. 2. A BMI of 18.5 to 24.9 is normal. 3. A BMI of 25 to 29.9 is considered  overweight. 4. A BMI of 30 and above is considered obese. 7. Maintain normal blood lipids and cholesterol levels by exercising and minimizing your intake of saturated fat. Eat a balanced diet with plenty of fruit and vegetables. Blood tests for lipids and cholesterol should begin at age 51 and be repeated every 5 years. If your lipid or cholesterol levels are high, you are over 50, or you are at high risk for heart disease, you may need your cholesterol levels checked more frequently.Ongoing high lipid and cholesterol levels should be treated with medicines if diet and exercise are not working. 8. If you smoke, find out from your health care provider how to quit. If you do not use tobacco, do not start. 9. Lung cancer screening is recommended for adults aged 74-80 years who are at high risk for developing lung cancer because of a history of smoking. A yearly low-dose CT scan of the lungs is recommended for people who have at least a 30-pack-year history of smoking and are a current smoker or have quit within the past 15 years. A pack year of smoking is smoking an average of 1 pack of cigarettes a day for 1 year (for example: 1 pack a day for 30 years or 2 packs a day for 15 years). Yearly screening should continue until the smoker has stopped smoking for at least 15 years. Yearly screening should be stopped for people who develop a health problem that would prevent them from having lung cancer treatment. 10. If you are pregnant, do not drink alcohol. If you are  breastfeeding, be very cautious about drinking alcohol. If you are not pregnant and choose to drink alcohol, do not have more than 1 drink per day. One drink is considered to be 12 ounces (355 mL) of beer, 5 ounces (148 mL) of wine, or 1.5 ounces (44 mL) of liquor. 11. Avoid use of street drugs. Do not share needles with anyone. Ask for help if you need support or instructions about stopping the use of drugs. 12. High blood pressure causes heart  disease and increases the risk of stroke. Your blood pressure should be checked at least every 1 to 2 years. Ongoing high blood pressure should be treated with medicines if weight loss and exercise do not work. 43. If you are 12-94 years old, ask your health care provider if you should take aspirin to prevent strokes. 14. Diabetes screening is done by taking a blood sample to check your blood glucose level after you have not eaten for a certain period of time (fasting). If you are not overweight and you do not have risk factors for diabetes, you should be screened once every 3 years starting at age 25. If you are overweight or obese and you are 100-44 years of age, you should be screened for diabetes every year as part of your cardiovascular risk assessment. 15. Breast cancer screening is essential preventive care for women. You should practice "breast self-awareness." This means understanding the normal appearance and feel of your breasts and may include breast self-examination. Any changes detected, no matter how small, should be reported to a health care provider. Women in their 72s and 30s should have a clinical breast exam (CBE) by a health care provider as part of a regular health exam every 1 to 3 years. After age 71, women should have a CBE every year. Starting at age 89, women should consider having a mammogram (breast X-ray test) every year. Women who have a family history of breast cancer should talk to their health care provider about genetic screening. Women at a high risk of breast cancer should talk to their health care providers about having an MRI and a mammogram every year. 16. Breast cancer gene (BRCA)-related cancer risk assessment is recommended for women who have family members with BRCA-related cancers. BRCA-related cancers include breast, ovarian, tubal, and peritoneal cancers. Having family members with these cancers may be associated with an increased risk for harmful changes (mutations)  in the breast cancer genes BRCA1 and BRCA2. Results of the assessment will determine the need for genetic counseling and BRCA1 and BRCA2 testing. 45. Your health care provider may recommend that you be screened regularly for cancer of the pelvic organs (ovaries, uterus, and vagina). This screening involves a pelvic examination, including checking for microscopic changes to the surface of your cervix (Pap test). You may be encouraged to have this screening done every 3 years, beginning at age 26. 33. For women ages 62-65, health care providers may recommend pelvic exams and Pap testing every 3 years, or they may recommend the Pap and pelvic exam, combined with testing for human papilloma virus (HPV), every 5 years. Some types of HPV increase your risk of cervical cancer. Testing for HPV may also be done on women of any age with unclear Pap test results. 2. Other health care providers may not recommend any screening for nonpregnant women who are considered low risk for pelvic cancer and who do not have symptoms. Ask your health care provider if a screening pelvic exam is right for  you. 18. If you have had past treatment for cervical cancer or a condition that could lead to cancer, you need Pap tests and screening for cancer for at least 20 years after your treatment. If Pap tests have been discontinued, your risk factors (such as having a new sexual partner) need to be reassessed to determine if screening should resume. Some women have medical problems that increase the chance of getting cervical cancer. In these cases, your health care provider may recommend more frequent screening and Pap tests. 19. Colorectal cancer can be detected and often prevented. Most routine colorectal cancer screening begins at the age of 27 years and continues through age 13 years. However, your health care provider may recommend screening at an earlier age if you have risk factors for colon cancer. On a yearly basis, your health care  provider may provide home test kits to check for hidden blood in the stool. Use of a small camera at the end of a tube, to directly examine the colon (sigmoidoscopy or colonoscopy), can detect the earliest forms of colorectal cancer. Talk to your health care provider about this at age 60, when routine screening begins. Direct exam of the colon should be repeated every 5-10 years through age 69 years, unless early forms of precancerous polyps or small growths are found. 20. People who are at an increased risk for hepatitis B should be screened for this virus. You are considered at high risk for hepatitis B if: 1. You were born in a country where hepatitis B occurs often. Talk with your health care provider about which countries are considered high risk. 2. Your parents were born in a high-risk country and you have not received a shot to protect against hepatitis B (hepatitis B vaccine). 3. You have HIV or AIDS. 4. You use needles to inject street drugs. 5. You live with, or have sex with, someone who has hepatitis B. 6. You get hemodialysis treatment. 7. You take certain medicines for conditions like cancer, organ transplantation, and autoimmune conditions. 21. Hepatitis C blood testing is recommended for all people born from [birth year] through 1965 and any individual with known risks for hepatitis C. 22. Practice safe sex. Use condoms and avoid high-risk sexual practices to reduce the spread of sexually transmitted infections (STIs). STIs include gonorrhea, chlamydia, syphilis, trichomonas, herpes, HPV, and human immunodeficiency virus (HIV). Herpes, HIV, and HPV are viral illnesses that have no cure. They can result in disability, cancer, and death. 23. You should be screened for sexually transmitted illnesses (STIs) including gonorrhea and chlamydia if: 1. You are sexually active and are younger than 24 years. 2. You are older than 24 years and your health care provider tells you that you are at risk for  this type of infection. 3. Your sexual activity has changed since you were last screened and you are at an increased risk for chlamydia or gonorrhea. Ask your health care provider if you are at risk. 24. If you are at risk of being infected with HIV, it is recommended that you take a prescription medicine daily to prevent HIV infection. This is called preexposure prophylaxis (PrEP). You are considered at risk if: 1. You are sexually active and do not regularly use condoms or know the HIV status of your partner(s). 2. You take drugs by injection. 3. You are sexually active with a partner who has HIV. 4. Talk with your health care provider about whether you are at high risk of being infected with HIV. If  you choose to begin PrEP, you should first be tested for HIV. You should then be tested every 3 months for as long as you are taking PrEP. 25. Osteoporosis is a disease in which the bones lose minerals and strength with aging. This can result in serious bone fractures or breaks. The risk of osteoporosis can be identified using a bone density scan. Women ages 76 years and over and women at risk for fractures or osteoporosis should discuss screening with their health care providers. Ask your health care provider whether you should take a calcium supplement or vitamin D to reduce the rate of osteoporosis. 26. Menopause can be associated with physical symptoms and risks. Hormone replacement therapy is available to decrease symptoms and risks. You should talk to your health care provider about whether hormone replacement therapy is right for you. 27. Use sunscreen. Apply sunscreen liberally and repeatedly throughout the day. You should seek shade when your shadow is shorter than you. Protect yourself by wearing long sleeves, pants, a wide-brimmed hat, and sunglasses year round, whenever you are outdoors. 28. Once a month, do a whole body skin exam, using a mirror to look at the skin on your back. Tell your health  care provider of new moles, moles that have irregular borders, moles that are larger than a pencil eraser, or moles that have changed in shape or color. 29. Stay current with required vaccines (immunizations). 1. Influenza vaccine. All adults should be immunized every year. 2. Tetanus, diphtheria, and acellular pertussis (Td, Tdap) vaccine. Pregnant women should receive 1 dose of Tdap vaccine during each pregnancy. The dose should be obtained regardless of the length of time since the last dose. Immunization is preferred during the 27th-36th week of gestation. An adult who has not previously received Tdap or who does not know her vaccine status should receive 1 dose of Tdap. This initial dose should be followed by tetanus and diphtheria toxoids (Td) booster doses every 10 years. Adults with an unknown or incomplete history of completing a 3-dose immunization series with Td-containing vaccines should begin or complete a primary immunization series including a Tdap dose. Adults should receive a Td booster every 10 years. 3. Varicella vaccine. An adult without evidence of immunity to varicella should receive 2 doses or a second dose if she has previously received 1 dose. Pregnant females who do not have evidence of immunity should receive the first dose after pregnancy. This first dose should be obtained before leaving the health care facility. The second dose should be obtained 4-8 weeks after the first dose. 4. Human papillomavirus (HPV) vaccine. Females aged 13-26 years who have not received the vaccine previously should obtain the 3-dose series. The vaccine is not recommended for use in pregnant females. However, pregnancy testing is not needed before receiving a dose. If a female is found to be pregnant after receiving a dose, no treatment is needed. In that case, the remaining doses should be delayed until after the pregnancy. Immunization is recommended for any person with an immunocompromised condition  through the age of 55 years if she did not get any or all doses earlier. During the 3-dose series, the second dose should be obtained 4-8 weeks after the first dose. The third dose should be obtained 24 weeks after the first dose and 16 weeks after the second dose. 5. Zoster vaccine. One dose is recommended for adults aged 60 years or older unless certain conditions are present. 6. Measles, mumps, and rubella (MMR) vaccine. Adults born  before 1957 generally are considered immune to measles and mumps. Adults born in [birth year] or later should have 1 or more doses of MMR vaccine unless there is a contraindication to the vaccine or there is laboratory evidence of immunity to each of the three diseases. A routine second dose of MMR vaccine should be obtained at least 28 days after the first dose for students attending postsecondary schools, health care workers, or international travelers. People who received inactivated measles vaccine or an unknown type of measles vaccine during 1963-1967 should receive 2 doses of MMR vaccine. People who received inactivated mumps vaccine or an unknown type of mumps vaccine before 1979 and are at high risk for mumps infection should consider immunization with 2 doses of MMR vaccine. For females of childbearing age, rubella immunity should be determined. If there is no evidence of immunity, females who are not pregnant should be vaccinated. If there is no evidence of immunity, females who are pregnant should delay immunization until after pregnancy. Unvaccinated health care workers born before [birth year] who lack laboratory evidence of measles, mumps, or rubella immunity or laboratory confirmation of disease should consider measles and mumps immunization with 2 doses of MMR vaccine or rubella immunization with 1 dose of MMR vaccine. 7. Pneumococcal 13-valent conjugate (PCV13) vaccine. When indicated, a person who is uncertain of his immunization history and has no record of immunization should  receive the PCV13 vaccine. All adults 66 years of age and older should receive this vaccine. An adult aged 31 years or older who has certain medical conditions and has not been previously immunized should receive 1 dose of PCV13 vaccine. This PCV13 should be followed with a dose of pneumococcal polysaccharide (PPSV23) vaccine. Adults who are at high risk for pneumococcal disease should obtain the PPSV23 vaccine at least 8 weeks after the dose of PCV13 vaccine. Adults older than 43 years of age who have normal immune system function should obtain the PPSV23 vaccine dose at least 1 year after the dose of PCV13 vaccine. 8. Pneumococcal polysaccharide (PPSV23) vaccine. When PCV13 is also indicated, PCV13 should be obtained first. All adults aged 43 years and older should be immunized. An adult younger than age 81 years who has certain medical conditions should be immunized. Any person who resides in a nursing home or long-term care facility should be immunized. An adult smoker should be immunized. People with an immunocompromised condition and certain other conditions should receive both PCV13 and PPSV23 vaccines. People with human immunodeficiency virus (HIV) infection should be immunized as soon as possible after diagnosis. Immunization during chemotherapy or radiation therapy should be avoided. Routine use of PPSV23 vaccine is not recommended for American Indians, [place] Natives, or people younger than 65 years unless there are medical conditions that require PPSV23 vaccine. When indicated, people who have unknown immunization and have no record of immunization should receive PPSV23 vaccine. One-time revaccination 5 years after the first dose of PPSV23 is recommended for people aged 19-64 years who have chronic kidney failure, nephrotic syndrome, asplenia, or immunocompromised conditions. People who received 1-2 doses of PPSV23 before age 57 years should receive another dose of PPSV23 vaccine at age 56 years or  later if at least 5 years have passed since the previous dose. Doses of PPSV23 are not needed for people immunized with PPSV23 at or after age 41 years. 9. Meningococcal vaccine. Adults with asplenia or persistent complement component deficiencies should receive 2 doses of quadrivalent meningococcal conjugate (MenACWY-D) vaccine. The doses should be obtained  at least 2 months apart. Microbiologists working with certain meningococcal bacteria, [employer] recruits, people at risk during an outbreak, and people who travel to or live in countries with a high rate of meningitis should be immunized. A first-year college student up through age 43 years who is living in a residence hall should receive a dose if she did not receive a dose on or after her 16th birthday. Adults who have certain high-risk conditions should receive one or more doses of vaccine. 10. Hepatitis A vaccine. Adults who wish to be protected from this disease, have certain high-risk conditions, work with hepatitis A-infected animals, work in hepatitis A research labs, or travel to or work in countries with a high rate of hepatitis A should be immunized. Adults who were previously unvaccinated and who anticipate close contact with an international adoptee during the first 60 days after arrival in the [place] from a country with a high rate of hepatitis A should be immunized. 11. Hepatitis B vaccine. Adults who wish to be protected from this disease, have certain high-risk conditions, may be exposed to blood or other infectious body fluids, are household contacts or sex partners of hepatitis B positive people, are clients or workers in certain care facilities, or travel to or work in countries with a high rate of hepatitis B should be immunized. 12. Haemophilus influenzae type b (Hib) vaccine. A previously unvaccinated person with asplenia or sickle cell disease or having a scheduled splenectomy should receive 1 dose of Hib vaccine. Regardless of  previous immunization, a recipient of a hematopoietic stem cell transplant should receive a 3-dose series 6-12 months after her successful transplant. Hib vaccine is not recommended for adults with HIV infection. Preventive Services / Frequency Ages 34 to 75 years  Blood pressure check.** / Every 3-5 years.  Lipid and cholesterol check.** / Every 5 years beginning at age 58.  Clinical breast exam.** / Every 3 years for women in their 60s and 26s.  BRCA-related cancer risk assessment.** / For women who have family members with a BRCA-related cancer (breast, ovarian, tubal, or peritoneal cancers).  Pap test.** / Every 2 years from ages 15 through 71. Every 3 years starting at age 51 through age 37 or 29 with a history of 3 consecutive normal Pap tests.  HPV screening.** / Every 3 years from ages 55 through ages 31 to 60 with a history of 3 consecutive normal Pap tests.  Hepatitis C blood test.** / For any individual with known risks for hepatitis C.  Skin self-exam. / Monthly.  Influenza vaccine. / Every year.  Tetanus, diphtheria, and acellular pertussis (Tdap, Td) vaccine.** / Consult your health care provider. Pregnant women should receive 1 dose of Tdap vaccine during each pregnancy. 1 dose of Td every 10 years.  Varicella vaccine.** / Consult your health care provider. Pregnant females who do not have evidence of immunity should receive the first dose after pregnancy.  HPV vaccine. / 3 doses over 6 months, if 59 and younger. The vaccine is not recommended for use in pregnant females. However, pregnancy testing is not needed before receiving a dose.  Measles, mumps, rubella (MMR) vaccine.** / You need at least 1 dose of MMR if you were born in 1957 or later. You may also need a 2nd dose. For females of childbearing age, rubella immunity should be determined. If there is no evidence of immunity, females who are not pregnant should be vaccinated. If there is no evidence of immunity,  females who  are pregnant should delay immunization until after pregnancy.  Pneumococcal 13-valent conjugate (PCV13) vaccine.** / Consult your health care provider.  Pneumococcal polysaccharide (PPSV23) vaccine.** / 1 to 2 doses if you smoke cigarettes or if you have certain conditions.  Meningococcal vaccine.** / 1 dose if you are age 29 to 21 years and a Market researcher living in a residence hall, or have one of several medical conditions, you need to get vaccinated against meningococcal disease. You may also need additional booster doses.  Hepatitis A vaccine.** / Consult your health care provider.  Hepatitis B vaccine.** / Consult your health care provider.  Haemophilus influenzae type b (Hib) vaccine.** / Consult your health care provider. Ages 73 to 9 years  Blood pressure check.** / Every year.  Lipid and cholesterol check.** / Every 5 years beginning at age 55 years.  Lung cancer screening. / Every year if you are aged 51-80 years and have a 30-pack-year history of smoking and currently smoke or have quit within the past 15 years. Yearly screening is stopped once you have quit smoking for at least 15 years or develop a health problem that would prevent you from having lung cancer treatment.  Clinical breast exam.** / Every year after age 38 years.  BRCA-related cancer risk assessment.** / For women who have family members with a BRCA-related cancer (breast, ovarian, tubal, or peritoneal cancers).  Mammogram.** / Every year beginning at age 79 years and continuing for as long as you are in good health. Consult with your health care provider.  Pap test.** / Every 3 years starting at age 47 years through age 73 or 52 years with a history of 3 consecutive normal Pap tests.  HPV screening.** / Every 3 years from ages 54 years through ages 25 to 58 years with a history of 3 consecutive normal Pap tests.  Fecal occult blood test (FOBT) of stool. / Every year beginning at  age 1 years and continuing until age 30 years. You may not need to do this test if you get a colonoscopy every 10 years.  Flexible sigmoidoscopy or colonoscopy.** / Every 5 years for a flexible sigmoidoscopy or every 10 years for a colonoscopy beginning at age 49 years and continuing until age 28 years.  Hepatitis C blood test.** / For all people born from [birth year] through 1964 and any individual with known risks for hepatitis C.  Skin self-exam. / Monthly.  Influenza vaccine. / Every year.  Tetanus, diphtheria, and acellular pertussis (Tdap/Td) vaccine.** / Consult your health care provider. Pregnant women should receive 1 dose of Tdap vaccine during each pregnancy. 1 dose of Td every 10 years.  Varicella vaccine.** / Consult your health care provider. Pregnant females who do not have evidence of immunity should receive the first dose after pregnancy.  Zoster vaccine.** / 1 dose for adults aged 56 years or older.  Measles, mumps, rubella (MMR) vaccine.** / You need at least 1 dose of MMR if you were born in 1957 or later. You may also need a second dose. For females of childbearing age, rubella immunity should be determined. If there is no evidence of immunity, females who are not pregnant should be vaccinated. If there is no evidence of immunity, females who are pregnant should delay immunization until after pregnancy.  Pneumococcal 13-valent conjugate (PCV13) vaccine.** / Consult your health care provider.  Pneumococcal polysaccharide (PPSV23) vaccine.** / 1 to 2 doses if you smoke cigarettes or if you have certain conditions.  Meningococcal vaccine.** /  Consult your health care provider.  Hepatitis A vaccine.** / Consult your health care provider.  Hepatitis B vaccine.** / Consult your health care provider.  Haemophilus influenzae type b (Hib) vaccine.** / Consult your health care provider. Ages 63 years and over  Blood pressure check.** / Every year.  Lipid and cholesterol check.**  / Every 5 years beginning at age 35 years.  Lung cancer screening. / Every year if you are aged 58-80 years and have a 30-pack-year history of smoking and currently smoke or have quit within the past 15 years. Yearly screening is stopped once you have quit smoking for at least 15 years or develop a health problem that would prevent you from having lung cancer treatment.  Clinical breast exam.** / Every year after age 78 years.  BRCA-related cancer risk assessment.** / For women who have family members with a BRCA-related cancer (breast, ovarian, tubal, or peritoneal cancers).  Mammogram.** / Every year beginning at age 33 years and continuing for as long as you are in good health. Consult with your health care provider.  Pap test.** / Every 3 years starting at age 65 years through age 27 or 27 years with 3 consecutive normal Pap tests. Testing can be stopped between 65 and 70 years with 3 consecutive normal Pap tests and no abnormal Pap or HPV tests in the past 10 years.  HPV screening.** / Every 3 years from ages 54 years through ages 95 or 63 years with a history of 3 consecutive normal Pap tests. Testing can be stopped between 65 and 70 years with 3 consecutive normal Pap tests and no abnormal Pap or HPV tests in the past 10 years.  Fecal occult blood test (FOBT) of stool. / Every year beginning at age 85 years and continuing until age 29 years. You may not need to do this test if you get a colonoscopy every 10 years.  Flexible sigmoidoscopy or colonoscopy.** / Every 5 years for a flexible sigmoidoscopy or every 10 years for a colonoscopy beginning at age 65 years and continuing until age 60 years.  Hepatitis C blood test.** / For all people born from [birth year] through 1964 and any individual with known risks for hepatitis C.  Osteoporosis screening.** / A one-time screening for women ages 84 years and over and women at risk for fractures or osteoporosis.  Skin self-exam. / Monthly.  Influenza  vaccine. / Every year.  Tetanus, diphtheria, and acellular pertussis (Tdap/Td) vaccine.** / 1 dose of Td every 10 years.  Varicella vaccine.** / Consult your health care provider.  Zoster vaccine.** / 1 dose for adults aged 44 years or older.  Pneumococcal 13-valent conjugate (PCV13) vaccine.** / Consult your health care provider.  Pneumococcal polysaccharide (PPSV23) vaccine.** / 1 dose for all adults aged 43 years and older.  Meningococcal vaccine.** / Consult your health care provider.  Hepatitis A vaccine.** / Consult your health care provider.  Hepatitis B vaccine.** / Consult your health care provider.  Haemophilus influenzae type b (Hib) vaccine.** / Consult your health care provider. ** Family history and personal history of risk and conditions may change your health care provider's recommendations.   This information is not intended to replace advice given to you by your health care provider. Make sure you discuss any questions you have with your health care provider.   Document Released: 12/04/2001 Document Revised: 10/29/2014 Document Reviewed: 03/05/2011 Elsevier Interactive Patient Education 2016 [employer]. Smoking Cessation, Tips for Success If you are ready to quit smoking,  congratulations! You have chosen to help yourself be healthier. Cigarettes bring nicotine, tar, carbon monoxide, and other irritants into your body. Your lungs, heart, and blood vessels will be able to work better without these poisons. There are many different ways to quit smoking. Nicotine gum, nicotine patches, a nicotine inhaler, or nicotine nasal spray can help with physical craving. Hypnosis, support groups, and medicines help break the habit of smoking. WHAT THINGS CAN I DO TO MAKE QUITTING EASIER?  Here are some tips to help you quit for good: 54. Pick a date when you will quit smoking completely. Tell all of your friends and family about your plan to quit on that date. 31. Do not try to  slowly cut down on the number of cigarettes you are smoking. Pick a quit date and quit smoking completely starting on that day. 32. Throw away all cigarettes.  39. Clean and remove all ashtrays from your home, work, and car. 34. On a card, write down your reasons for quitting. Carry the card with you and read it when you get the urge to smoke. 35. Cleanse your body of nicotine. Drink enough water and fluids to keep your urine clear or pale yellow. Do this after quitting to flush the nicotine from your body. 36. Learn to predict your moods. Do not let a bad situation be your excuse to have a cigarette. Some situations in your life might tempt you into wanting a cigarette. 37. Never have "just one" cigarette. It leads to wanting another and another. Remind yourself of your decision to quit. 38. Change habits associated with smoking. If you smoked while driving or when feeling stressed, try other activities to replace smoking. Stand up when drinking your coffee. Brush your teeth after eating. Sit in a different chair when you read the paper. Avoid alcohol while trying to quit, and try to drink fewer caffeinated beverages. Alcohol and caffeine may urge you to smoke. 39. Avoid foods and drinks that can trigger a desire to smoke, such as sugary or spicy foods and alcohol. 40. Ask people who smoke not to smoke around you. 41. Have something planned to do right after eating or having a cup of coffee. For example, plan to take a walk or exercise. 42. Try a relaxation exercise to calm you down and decrease your stress. Remember, you may be tense and nervous for the first 2 weeks after you quit, but this will pass. 45. Find new activities to keep your hands busy. Play with a pen, coin, or rubber band. Doodle or draw things on paper. 44. Brush your teeth right after eating. This will help cut down on the craving for the taste of tobacco after meals. You can also try mouthwash.  45. Use oral substitutes in place  of cigarettes. Try using lemon drops, carrots, cinnamon sticks, or chewing gum. Keep them handy so they are available when you have the urge to smoke. 46. When you have the urge to smoke, try deep breathing. 59. Designate your home as a nonsmoking area. 48. If you are a heavy smoker, ask your health care provider about a prescription for nicotine chewing gum. It can ease your withdrawal from nicotine. 49. Reward yourself. Set aside the cigarette money you save and buy yourself something nice. 50. Look for support from others. Join a support group or smoking cessation program. Ask someone at home or at work to help you with your plan to quit smoking. 51. Always ask yourself, "Do I need this  cigarette or is this just a reflex?" Tell yourself, "Today, I choose not to smoke," or "I do not want to smoke." You are reminding yourself of your decision to quit. 52. Do not replace cigarette smoking with electronic cigarettes (commonly called e-cigarettes). The safety of e-cigarettes is unknown, and some may contain harmful chemicals. 53. If you relapse, do not give up! Plan ahead and think about what you will do the next time you get the urge to smoke. HOW WILL I FEEL WHEN I QUIT SMOKING? You may have symptoms of withdrawal because your body is used to nicotine (the addictive substance in cigarettes). You may crave cigarettes, be irritable, feel very hungry, cough often, get headaches, or have difficulty concentrating. The withdrawal symptoms are only temporary. They are strongest when you first quit but will go away within 10-14 days. When withdrawal symptoms occur, stay in control. Think about your reasons for quitting. Remind yourself that these are signs that your body is healing and getting used to being without cigarettes. Remember that withdrawal symptoms are easier to treat than the major diseases that smoking can cause.  Even after the withdrawal is over, expect periodic urges to smoke. However, these  cravings are generally short lived and will go away whether you smoke or not. Do not smoke! WHAT RESOURCES ARE AVAILABLE TO HELP ME QUIT SMOKING? Your health care provider can direct you to community resources or hospitals for support, which may include:  Group support.  Education.  Hypnosis.  Therapy.   This information is not intended to replace advice given to you by your health care provider. Make sure you discuss any questions you have with your health care provider.   Document Released: 07/06/2004 Document Revised: 10/29/2014 Document Reviewed: 03/26/2013 Elsevier Interactive Patient Education 2016 [employer]. Kegel Exercises The goal of Kegel exercises is to isolate and exercise your pelvic floor muscles. These muscles act as a hammock that supports the rectum, vagina, small intestine, and uterus. As the muscles weaken, the hammock sags and these organs are displaced from their normal positions. Kegel exercises can strengthen your pelvic floor muscles and help you to improve bladder and bowel control, improve sexual response, and help reduce many problems and some discomfort during pregnancy. Kegel exercises can be done anywhere and at any time. HOW TO PERFORM KEGEL EXERCISES 54. Locate your pelvic floor muscles. To do this, squeeze (contract) the muscles that you use when you try to stop the flow of urine. You will feel a tightness in the vaginal area (women) and a tight lift in the rectal area (men and women). 55. When you begin, contract your pelvic muscles tight for 2-5 seconds, then relax them for 2-5 seconds. This is one set. Do 4-5 sets with a short pause in between. 56. Contract your pelvic muscles for 8-10 seconds, then relax them for 8-10 seconds. Do 4-5 sets. If you cannot contract your pelvic muscles for 8-10 seconds, try 5-7 seconds and work your way up to 8-10 seconds. Your goal is 4-5 sets of 10 contractions each day. Keep your stomach, buttocks, and legs relaxed during  the exercises. Perform sets of both short and long contractions. Vary your positions. Perform these contractions 3-4 times per day. Perform sets while you are:   Lying in bed in the morning.  Standing at lunch.  Sitting in the late afternoon.  Lying in bed at night. You should do 40-50 contractions per day. Do not perform more Kegel exercises per day than recommended. Overexercising  can cause muscle fatigue. Continue these exercises for for at least 15-20 weeks or as directed by your caregiver.   This information is not intended to replace advice given to you by your health care provider. Make sure you discuss any questions you have with your health care provider.   Document Released: 09/24/2012 Document Revised: 10/29/2014 Document Reviewed: 09/24/2012 Elsevier Interactive Patient Education [employer].

## 2015-09-29 NOTE — Assessment & Plan Note (Signed)
Discussed weight loss, addition of exercise

## 2015-09-29 NOTE — Assessment & Plan Note (Signed)
Using pads--Kegel's given, weight loss will help with this as well.

## 2015-09-29 NOTE — Assessment & Plan Note (Signed)
Continue current meds. For f/u in January

## 2015-09-29 NOTE — Assessment & Plan Note (Signed)
Refilled her iron, although last check had normal Ferritin and Hgb.

## 2015-09-30 LAB — RHEUMATOID FACTOR: Rhuematoid fact SerPl-aCnc: <10 IU/mL (ref <=14)

## 2015-09-30 LAB — FOLLICLE STIMULATING HORMONE: FSH: 6.5 m[IU]/mL

## 2015-09-30 LAB — T4, FREE

## 2015-09-30 LAB — T3, FREE

## 2015-09-30 LAB — SEDIMENTATION RATE: Sed Rate: 4 mm/hr (ref 0–20)

## 2015-09-30 LAB — ANA: Anti Nuclear Antibody(ANA): NEGATIVE

## 2015-09-30 NOTE — Addendum Note (Signed)
Addended by: Valerie Roys on: 09/30/2015 11:45 AM   Modules accepted: Orders

## 2015-10-03 ENCOUNTER — Telehealth: Payer: Self-pay | Admitting: *Deleted

## 2015-10-03 NOTE — Telephone Encounter (Signed)
LM for patient to call back and schedule a lab appt to follow up on her thyroid level.  Jazmin Hartsell,CMA

## 2015-10-03 NOTE — Telephone Encounter (Signed)
-----   Message from Donnamae Jude, MD sent at 09/30/2015  4:03 PM EST ----- They could not run--can we have her come in for another draw?

## 2015-10-07 ENCOUNTER — Emergency Department (HOSPITAL_COMMUNITY): Payer: Commercial Managed Care - HMO

## 2015-10-07 ENCOUNTER — Emergency Department (HOSPITAL_COMMUNITY)
Admission: EM | Admit: 2015-10-07 | Discharge: 2015-10-07 | Disposition: A | Discharge status: Home / Self Care | Payer: Commercial Managed Care - HMO | Attending: Emergency Medicine | Admitting: Emergency Medicine

## 2015-10-07 ENCOUNTER — Encounter (HOSPITAL_COMMUNITY): Payer: Self-pay | Admitting: Emergency Medicine

## 2015-10-07 ENCOUNTER — Encounter: Payer: Self-pay | Admitting: *Deleted

## 2015-10-07 ENCOUNTER — Telehealth: Payer: Self-pay | Admitting: *Deleted

## 2015-10-07 DIAGNOSIS — Z791 Long term (current) use of non-steroidal anti-inflammatories (NSAID): Secondary | ICD-10-CM | POA: Diagnosis not present

## 2015-10-07 DIAGNOSIS — E669 Obesity, unspecified: Secondary | ICD-10-CM | POA: Diagnosis not present

## 2015-10-07 DIAGNOSIS — F17210 Nicotine dependence, cigarettes, uncomplicated: Secondary | ICD-10-CM | POA: Diagnosis not present

## 2015-10-07 DIAGNOSIS — R079 Chest pain, unspecified: Secondary | ICD-10-CM

## 2015-10-07 DIAGNOSIS — M1990 Unspecified osteoarthritis, unspecified site: Secondary | ICD-10-CM | POA: Diagnosis not present

## 2015-10-07 DIAGNOSIS — R011 Cardiac murmur, unspecified: Secondary | ICD-10-CM | POA: Diagnosis not present

## 2015-10-07 DIAGNOSIS — Z923 Personal history of irradiation: Secondary | ICD-10-CM | POA: Insufficient documentation

## 2015-10-07 DIAGNOSIS — Z853 Personal history of malignant neoplasm of breast: Secondary | ICD-10-CM | POA: Diagnosis not present

## 2015-10-07 DIAGNOSIS — Z8632 Personal history of gestational diabetes: Secondary | ICD-10-CM | POA: Insufficient documentation

## 2015-10-07 DIAGNOSIS — F319 Bipolar disorder, unspecified: Secondary | ICD-10-CM | POA: Insufficient documentation

## 2015-10-07 DIAGNOSIS — Z79899 Other long term (current) drug therapy: Secondary | ICD-10-CM | POA: Diagnosis not present

## 2015-10-07 DIAGNOSIS — R0789 Other chest pain: Secondary | ICD-10-CM | POA: Diagnosis not present

## 2015-10-07 DIAGNOSIS — D649 Anemia, unspecified: Secondary | ICD-10-CM | POA: Insufficient documentation

## 2015-10-07 LAB — BASIC METABOLIC PANEL
Anion gap: 11 (ref 5–15)
BUN: 7 mg/dL (ref 6–20)
CO2: 22 mmol/L (ref 22–32)
Calcium: 9.4 mg/dL (ref 8.9–10.3)
Chloride: 103 mmol/L (ref 101–111)
Creatinine, Ser: 1 mg/dL (ref 0.44–1.00)
GFR calc Af Amer: >60 mL/min (ref >60)
GFR calc non Af Amer: >60 mL/min (ref >60)
Glucose, Bld: 96 mg/dL (ref 65–99)
Potassium: 3.9 mmol/L (ref 3.5–5.1)
Sodium: 136 mmol/L (ref 135–145)

## 2015-10-07 LAB — BRAIN NATRIURETIC PEPTIDE: B Natriuretic Peptide: 5.1 pg/mL (ref 0.0–100.0)

## 2015-10-07 LAB — I-STAT CHEM 8, ED
BUN: 7 mg/dL (ref 6–20)
Calcium, Ion: 1.16 mmol/L (ref 1.12–1.23)
Chloride: 102 mmol/L (ref 101–111)
Creatinine, Ser: 0.9 mg/dL (ref 0.44–1.00)
Glucose, Bld: 92 mg/dL (ref 65–99)
HCT: 49 % — ABNORMAL HIGH (ref 36.0–46.0)
Hemoglobin: 16.7 g/dL — ABNORMAL HIGH (ref 12.0–15.0)
Potassium: 3.8 mmol/L (ref 3.5–5.1)
Sodium: 138 mmol/L (ref 135–145)
TCO2: 24 mmol/L (ref 0–100)

## 2015-10-07 LAB — CBC
HCT: 44.7 % (ref 36.0–46.0)
Hemoglobin: 15.2 g/dL — ABNORMAL HIGH (ref 12.0–15.0)
MCH: 28.1 pg (ref 26.0–34.0)
MCHC: 34 g/dL (ref 30.0–36.0)
MCV: 82.8 fL (ref 78.0–100.0)
Platelets: 215 10*3/uL (ref 150–400)
RBC: 5.4 MIL/uL — ABNORMAL HIGH (ref 3.87–5.11)
RDW: 15.1 % (ref 11.5–15.5)
WBC: 5.7 10*3/uL (ref 4.0–10.5)

## 2015-10-07 LAB — I-STAT TROPONIN, ED: Troponin i, poc: 0 ng/mL (ref 0.00–0.08)

## 2015-10-07 MED ORDER — IOHEXOL 350 MG/ML SOLN
80.0000 mL | Freq: Once | INTRAVENOUS | Status: AC | PRN
  Administered 2015-10-07: 80 mL via INTRAVENOUS

## 2015-10-07 NOTE — ED Notes (Signed)
EKG completed given to EDP.  

## 2015-10-07 NOTE — Telephone Encounter (Signed)
Patient in nurse clinic with her children and spouse for their flu vaccines.  Patient was sitting in nursing office and starting holding her chest.  Nurse asked patient if she was ok; patient stated she was having chest pain.  Pain located left side above breast area.  The pain lasted a few minutes; pain level 10/10.  Nurse assisted patient to standing position and she began to have pains again.  Pt has history of smoking a pack of cigarettes a day.  Pt has been having chest pain everyday; several times in a day for a month.  Blood pressure 150/90 manually, heart rate 122; 97% RA.  Patient usually take 81 mg of Asprin a day; however she did not take it this morning.  Patient was moved to exam room and Dr. Lindell Noe came in to assess the patient.  Asprin 325 mg given at 10:16 AM and Nitro 0.4 mg given at 10:15 AM.  Patient was placed on 2 Liters of Oxygen.  Patient had several more episodes of chest pain lasting a few minutes.  Lung sounds were clear per Dr. Lindell Noe.  Tried to get a 12 Lead EKG, but unable to read.  EMS was called to transport patient to ED.  BP reading was taken again before EMS arrived 130/82, heart rate 124, temp 97.4.  Patient also complained of left leg cramp and arm pain.  Taking deep breath help relieved some of the pain. EMS transported patient to ED.   Derl Barrow, RN

## 2015-10-07 NOTE — ED Provider Notes (Signed)
CSN: [id number]     Arrival date & time 10/07/15  1058 History   First MD Initiated Contact with Patient 10/07/15 1059     Chief Complaint  Patient presents with  . Chest Pain     (Consider location/radiation/quality/duration/timing/severity/associated sxs/prior Treatment) Patient is a 44 y.o. female presenting with chest pain. The history is provided by the patient.  Chest Pain Pain location:  L lateral chest Pain quality: sharp and shooting   Radiates to: left leg. Pain radiates to the back: no   Pain severity:  Severe Onset quality:  Sudden Duration: less than a minute. Timing:  Intermittent Progression:  Waxing and waning Chronicity:  Recurrent (2 weeks) Relieved by:  Nothing Worsened by:  Nothing tried Ineffective treatments:  None tried Associated symptoms: no cough, no dizziness, no fatigue, no fever, no headache, no lower extremity edema, no nausea, no numbness, no palpitations, no shortness of breath and not vomiting   Risk factors: diabetes mellitus, high cholesterol, hypertension and obesity   Risk factors: no coronary artery disease     44 yo F with a chief complaint of chest pain. This been off and on for the past month. Patient states that the pain comes and goes that sharp shooting last for less than a minute at a time. Pain goes from her left lateral chest down into her legs from time to time. Patient had 3 of these episodes happen at her family practice office this morning they gave her 1 nitroglycerin and 324 of aspirin and Center here. Patient denies shortness of breath with these episodes denies diaphoresis nausea or vomiting. Patient has a history of breast cancer had a lumpectomy and radiation therapy. There are multiple notes in the system requesting the patient to have a follow-up visit for hormone therapy however the patient has not shown up for any of these appointments. Per the patient she is currently cancer free. Denies history of blood clots. Denies lower  extremity edema denies recent surgery. Denies recent long travel.  Past Medical History  Diagnosis Date  . Anemia   . Arthritis   . Bipolar 1 disorder ([place])   . Blood transfusion without reported diagnosis 2007    post bleeding childbirth  . Cancer ([place])     eval  lt breast cancer  . Heart murmur     as a child-not adult-no cardiac work up  . Diabetes mellitus without complication ([place])     gestational  . History of radiation therapy 08/31/14-10/21/14    left breast/ left supraclavicular 50.4 Gy 28 fx, lef tposterior axillary boost 9.52 Gy 28 fx, left rbeast boost/ 10 Gy 5 fx   Past Surgical History  Procedure Laterality Date  . Tonsillectomy and adenoidectomy    . Dilation and curettage of uterus      after childbirth  . Multiple tooth extractions      only 5 left  . Breast biopsy Left 04/01/14  . Breast biopsy Right 04/19/14  . Breast lumpectomy with axillary lymph node dissection Bilateral 05/03/14  . Portacath placement Right 06/18/2014    Procedure: INSERTION PORT-A-CATH;  Surgeon: [name], MD;  Location: [place];  Service: General;  Laterality: Right;   Family History  Problem Relation Age of Onset  . Heart disease Father   . Cancer Father     Prostate   Social History  Substance Use Topics  . Smoking status: Current Every Day Smoker -- 1.00 packs/day for 32 years    Types: Cigarettes  .  Smokeless tobacco: Never Used     Comment: 11/26/14 smoking 1 pack every 2 days  . Alcohol Use: No   OB History    No data available     Review of Systems  Constitutional: Negative for fever, chills and fatigue.  HENT: Negative for congestion and rhinorrhea.   Eyes: Negative for redness and visual disturbance.  Respiratory: Negative for cough, shortness of breath and wheezing.   Cardiovascular: Negative for chest pain and palpitations.  Gastrointestinal: Negative for nausea and vomiting.  Genitourinary: Negative for dysuria and urgency.   Musculoskeletal: Negative for myalgias and arthralgias.  Skin: Negative for pallor and wound.  Neurological: Negative for dizziness, numbness and headaches.      Allergies  Review of patient's allergies indicates no known allergies.  Home Medications   Prior to Admission medications   Medication Sig Start Date End Date Taking? Authorizing Provider  albuterol (PROVENTIL HFA;VENTOLIN HFA) 108 (90 BASE) MCG/ACT inhaler Inhale 2 puffs into the lungs every 6 (six) hours as needed for wheezing or shortness of breath. 07/16/14  Yes Minette Headland, NP  citalopram (CELEXA) 40 MG tablet Take 40 mg by mouth daily.   Yes Historical Provider, MD  clonazePAM (KLONOPIN) 1 MG tablet Take 1 mg by mouth 3 (three) times daily.  03/21/14  Yes Historical Provider, MD  ferrous sulfate 325 (65 FE) MG tablet Take 1 tablet (325 mg total) by mouth daily with breakfast. 09/29/15  Yes Donnamae Jude, MD  ipratropium (ATROVENT) 0.06 % nasal spray Place 2 sprays into both nostrils 4 (four) times daily. 11/30/14  Yes Melony Overly, MD  lamoTRIgine (LAMICTAL) 200 MG tablet Take 1 tablet (200 mg total) by mouth 2 (two) times daily. 07/25/15  Yes Ashly M Gottschalk, DO  naproxen (NAPROSYN) 500 MG tablet Take 1 tablet (500 mg total) by mouth 2 (two) times daily with a meal. 01/19/15  Yes [name], MD  omeprazole (PRILOSEC) 40 MG capsule Take 1 capsule (40 mg total) by mouth daily. 01/19/15  Yes [name], MD  prazosin (MINIPRESS) 2 MG capsule  07/01/14  Yes Historical Provider, MD  QUEtiapine (SEROQUEL) 200 MG tablet Take 1 tablet (200 mg total) by mouth 2 (two) times daily. 09/15/14  Yes [name], DO  risperiDONE (RISPERDAL) 3 MG tablet Take 3 mg by mouth daily. 08/11/14  Yes Historical Provider, MD  anastrozole (ARIMIDEX) 1 MG tablet TAKE 1 TABLET BY MOUTH DAILY 07/19/15   [name], MD  varenicline (CHANTIX) 0.5 MG tablet Take 1 tablet (0.5 mg total) by mouth 2 (two) times daily. 09/29/15   Donnamae Jude, MD   BP 101/65 mmHg  Pulse 98  Temp(Src) 97.7 F (36.5 C) (Oral)  Resp 16  Ht 5\' 9"  (1.753 m)  Wt 310 lb (140.615 kg)  BMI 45.76 kg/m2  SpO2 97% Physical Exam  Constitutional: She is oriented to person, place, and time. She appears well-developed and well-nourished. No distress.  obese  HENT:  Head: Normocephalic and atraumatic.  Eyes: EOM are normal. Pupils are equal, round, and reactive to light.  Neck: Normal range of motion. Neck supple.  Cardiovascular: Normal rate, regular rhythm and intact distal pulses.  Exam reveals no gallop and no friction rub.   No murmur heard. Pulmonary/Chest: Effort normal. She has no wheezes. She has no rales.  Abdominal: Soft. She exhibits no distension. There is no tenderness. There is no rebound and no guarding.  Musculoskeletal: She exhibits no edema or  tenderness.  Neurological: She is alert and oriented to person, place, and time.  Skin: Skin is warm and dry. She is not diaphoretic.  Psychiatric: She has a normal mood and affect. Her behavior is normal.  Nursing note and vitals reviewed.   ED Course  Procedures (including critical care time) Labs Review Labs Reviewed  CBC - Abnormal; Notable for the following:    RBC 5.40 (*)    Hemoglobin 15.2 (*)    All other components within normal limits  I-STAT CHEM 8, ED - Abnormal; Notable for the following:    Hemoglobin 16.7 (*)    HCT 49.0 (*)    All other components within normal limits  BASIC METABOLIC PANEL  BRAIN NATRIURETIC PEPTIDE  I-STAT TROPOININ, ED  Randolm Idol, ED    Imaging Review Dg Chest 2 View  10/07/2015  CLINICAL DATA:  Chest pain EXAM: CHEST  2 VIEW COMPARISON:  11/30/2014 FINDINGS: Right subclavian Port-A-Cath tip in the mid SVC unchanged. Mild kinking of the proximal catheter unchanged. Surgical clips in the left axilla. Heart size and vascularity within normal limits. Negative for heart failure. Lungs are clear without infiltrate effusion or mass  lesion. IMPRESSION: No active cardiopulmonary disease. Electronically Signed   By: Franchot Gallo M.D.   On: 10/07/2015 12:42   Ct Angio Chest Pe W/cm &/or Wo Cm  10/07/2015  CLINICAL DATA:  Chest pain and cough.  History of breast carcinoma EXAM: CT ANGIOGRAPHY CHEST WITH CONTRAST TECHNIQUE: Multidetector CT imaging of the chest was performed using the standard protocol during bolus administration of intravenous contrast. Multiplanar CT image reconstructions and MIPs were obtained to evaluate the vascular anatomy. CONTRAST:  70mL OMNIPAQUE IOHEXOL 350 MG/ML SOLN COMPARISON:  Chest CT July 10, 2014 ; chest radiograph October 07, 2015 FINDINGS: There is no demonstrable pulmonary embolus. There is no thoracic aortic aneurysm or dissection. The visualized great vessels appear unremarkable. There is modest atherosclerotic calcification in the aortic arch region. There is no lung edema or consolidation. No parenchymal lung nodular lesions are identified. Visualized thyroid appears normal. There is no demonstrable thoracic adenopathy. The pericardium is not thickened. The right azygos vein is mildly prominent and nonenhancing at this time, likely due to timing of the contrast bolus. In the visualized upper abdomen, there is cholelithiasis. There is slight left adrenal hypertrophy. Visualized upper abdominal structures otherwise appear unremarkable. There are no blastic or lytic bone lesions. Review of the MIP images confirms the above findings. IMPRESSION: No demonstrable pulmonary embolus. No edema or consolidation. No apparent adenopathy. There is cholelithiasis. Electronically Signed   By: Lowella Grip III M.D.   On: 10/07/2015 14:37   I have personally reviewed and evaluated these images and lab results as part of my medical decision-making.   EKG Interpretation   Date/Time:  Friday October 07 2015 10:59:24 EST Ventricular Rate:  110 PR Interval:  141 QRS Duration: 98 QT Interval:  379 QTC  Calculation: 513 R Axis:   94 Text Interpretation:  Sinus tachycardia Borderline right axis deviation  Confirmed by Mack Thurmon MD, DANIEL [id number]) on 10/07/2015 11:03:05 AM      MDM   Final diagnoses:  Chest pain, unspecified chest pain type    44 yo F with a chief complaint of chest pain. This been off and on for the past month. Pain is atypical of cardiac chest pain. However patient does have a history of breast cancer. Patient had tachycardia on arrival. Concern for possibility of PE will obtain a CT  angio of the chest.  CT scan of the chest negative for pulmonary embolism. Initial troponin was negative, patient was offered a delta troponin and refused. Will have the patient follow-up with their family doctor.  3:28 PM:  I have discussed the diagnosis/risks/treatment options with the patient and family and believe the pt to be eligible for discharge home to follow-up with PCP. We also discussed returning to the ED immediately if new or worsening sx occur. We discussed the sx which are most concerning (e.g., sudden worsening pain, sob, fever, inability to tolerate by mouth) that necessitate immediate return. Medications administered to the patient during their visit and any new prescriptions provided to the patient are listed below.  Medications given during this visit Medications  iohexol (OMNIPAQUE) 350 MG/ML injection 80 mL (80 mLs Intravenous Contrast Given 10/07/15 1408)    New Prescriptions   No medications on file    The patient appears reasonably screen and/or stabilized for discharge and I doubt any other medical condition or other [facility] requiring further screening, evaluation, or treatment in the ED at this time prior to discharge.    Deno Etienne, DO 10/07/15 [id number]

## 2015-10-07 NOTE — ED Notes (Signed)
Onset one month ago intermittent chest pain seen Doctor last week at [facility]. Today while at [facility] office with kids to get them flu shots and again developed chest pain. One nitro and aspirin 324mg  administered at office. EMS transported patient.

## 2015-10-07 NOTE — Discharge Instructions (Signed)
We do not find the cause of your chest pain. His most likely related to your stomach based on the symptoms you've been having. Try Zantac twice a day for this pain. Follow-up with your family doctor for possible further testing Nonspecific Chest Pain  Chest pain can be caused by many different conditions. There is always a chance that your pain could be related to something serious, such as a heart attack or a blood clot in your lungs. Chest pain can also be caused by conditions that are not life-threatening. If you have chest pain, it is very important to follow up with your health care provider. CAUSES  Chest pain can be caused by:  Heartburn.  Pneumonia or bronchitis.  Anxiety or stress.  Inflammation around your heart (pericarditis) or lung (pleuritis or pleurisy).  A blood clot in your lung.  A collapsed lung (pneumothorax). It can develop suddenly on its own (spontaneous pneumothorax) or from trauma to the chest.  Shingles infection (varicella-zoster virus).  Heart attack.  Damage to the bones, muscles, and cartilage that make up your chest wall. This can include:  Bruised bones due to injury.  Strained muscles or cartilage due to frequent or repeated coughing or overwork.  Fracture to one or more ribs.  Sore cartilage due to inflammation (costochondritis). RISK FACTORS  Risk factors for chest pain may include:  Activities that increase your risk for trauma or injury to your chest.  Respiratory infections or conditions that cause frequent coughing.  Medical conditions or overeating that can cause heartburn.  Heart disease or family history of heart disease.  Conditions or health behaviors that increase your risk of developing a blood clot.  Having had chicken pox (varicella zoster). SIGNS AND SYMPTOMS Chest pain can feel like:  Burning or tingling on the surface of your chest or deep in your chest.  Crushing, pressure, aching, or squeezing pain.  Dull or  sharp pain that is worse when you move, cough, or take a deep breath.  Pain that is also felt in your back, neck, shoulder, or arm, or pain that spreads to any of these areas. Your chest pain may come and go, or it may stay constant. DIAGNOSIS Lab tests or other studies may be needed to find the cause of your pain. Your health care provider may have you take a test called an ambulatory ECG (electrocardiogram). An ECG records your heartbeat patterns at the time the test is performed. You may also have other tests, such as:  Transthoracic echocardiogram (TTE). During echocardiography, sound waves are used to create a picture of all of the heart structures and to look at how blood flows through your heart.  Transesophageal echocardiogram (TEE).This is a more advanced imaging test that obtains images from inside your body. It allows your health care provider to see your heart in finer detail.  Cardiac monitoring. This allows your health care provider to monitor your heart rate and rhythm in real time.  Holter monitor. This is a portable device that records your heartbeat and can help to diagnose abnormal heartbeats. It allows your health care provider to track your heart activity for several days, if needed.  Stress tests. These can be done through exercise or by taking medicine that makes your heart beat more quickly.  Blood tests.  Imaging tests. TREATMENT  Your treatment depends on what is causing your chest pain. Treatment may include:  Medicines. These may include:  Acid blockers for heartburn.  Anti-inflammatory medicine.  Pain medicine for  inflammatory conditions.  Antibiotic medicine, if an infection is present.  Medicines to dissolve blood clots.  Medicines to treat coronary artery disease.  Supportive care for conditions that do not require medicines. This may include:  Resting.  Applying heat or cold packs to injured areas.  Limiting activities until pain  decreases. HOME CARE INSTRUCTIONS  If you were prescribed an antibiotic medicine, finish it all even if you start to feel better.  Avoid any activities that bring on chest pain.  Do not use any tobacco products, including cigarettes, chewing tobacco, or electronic cigarettes. If you need help quitting, ask your health care provider.  Do not drink alcohol.  Take medicines only as directed by your health care provider.  Keep all follow-up visits as directed by your health care provider. This is important. This includes any further testing if your chest pain does not go away.  If heartburn is the cause for your chest pain, you may be told to keep your head raised (elevated) while sleeping. This reduces the chance that acid will go from your stomach into your esophagus.  Make lifestyle changes as directed by your health care provider. These may include:  Getting regular exercise. Ask your health care provider to suggest some activities that are safe for you.  Eating a heart-healthy diet. A registered dietitian can help you to learn healthy eating options.  Maintaining a healthy weight.  Managing diabetes, if necessary.  Reducing stress. SEEK MEDICAL CARE IF:  Your chest pain does not go away after treatment.  You have a rash with blisters on your chest.  You have a fever. SEEK IMMEDIATE MEDICAL CARE IF:   Your chest pain is worse.  You have an increasing cough, or you cough up blood.  You have severe abdominal pain.  You have severe weakness.  You faint.  You have chills.  You have sudden, unexplained chest discomfort.  You have sudden, unexplained discomfort in your arms, back, neck, or jaw.  You have shortness of breath at any time.  You suddenly start to sweat, or your skin gets clammy.  You feel nauseous or you vomit.  You suddenly feel light-headed or dizzy.  Your heart begins to beat quickly, or it feels like it is skipping beats. These symptoms may  represent a serious problem that is an emergency. Do not wait to see if the symptoms will go away. Get medical help right away. Call your local emergency services (911 in the [place] Do not drive yourself to the hospital.   This information is not intended to replace advice given to you by your health care provider. Make sure you discuss any questions you have with your health care provider.   Document Released: 07/18/2005 Document Revised: 10/29/2014 Document Reviewed: 05/14/2014 Elsevier Interactive Patient Education [employer].

## 2015-10-12 ENCOUNTER — Encounter: Payer: Self-pay | Admitting: Radiation Oncology

## 2015-10-13 ENCOUNTER — Other Ambulatory Visit: Payer: Self-pay | Admitting: Oncology

## 2015-10-17 ENCOUNTER — Other Ambulatory Visit: Payer: Self-pay | Admitting: Oncology

## 2015-10-18 ENCOUNTER — Telehealth: Payer: Self-pay | Admitting: Nurse Practitioner

## 2015-10-18 ENCOUNTER — Other Ambulatory Visit: Payer: Self-pay | Admitting: *Deleted

## 2015-10-18 ENCOUNTER — Other Ambulatory Visit: Payer: Self-pay | Admitting: Oncology

## 2015-10-18 NOTE — Progress Notes (Signed)
Obtained refill request for arimidex.  Noted pt has been a no show for prior appointments.  30 day refill given and URGENT pof sent to schedule pt for follow up with request to document communication with pt.

## 2015-10-18 NOTE — Telephone Encounter (Signed)
Spoke with the patient and she confirmed to come in this Friday 10/21/15 11:45

## 2015-10-21 ENCOUNTER — Other Ambulatory Visit: Payer: Self-pay | Admitting: *Deleted

## 2015-10-21 ENCOUNTER — Encounter: Payer: Self-pay | Admitting: Nurse Practitioner

## 2015-10-21 ENCOUNTER — Telehealth: Payer: Self-pay | Admitting: Oncology

## 2015-10-21 ENCOUNTER — Other Ambulatory Visit: Payer: Self-pay | Admitting: Oncology

## 2015-10-21 ENCOUNTER — Ambulatory Visit (HOSPITAL_BASED_OUTPATIENT_CLINIC_OR_DEPARTMENT_OTHER): Payer: Commercial Managed Care - HMO | Admitting: Nurse Practitioner

## 2015-10-21 ENCOUNTER — Other Ambulatory Visit: Payer: Self-pay | Admitting: Nurse Practitioner

## 2015-10-21 VITALS — BP 139/83 | HR 95 | Temp 97.7°F | Resp 20 | Ht 69.0 in | Wt 311.7 lb

## 2015-10-21 DIAGNOSIS — Z853 Personal history of malignant neoplasm of breast: Secondary | ICD-10-CM

## 2015-10-21 DIAGNOSIS — N898 Other specified noninflammatory disorders of vagina: Secondary | ICD-10-CM

## 2015-10-21 DIAGNOSIS — Z79811 Long term (current) use of aromatase inhibitors: Secondary | ICD-10-CM

## 2015-10-21 DIAGNOSIS — M2550 Pain in unspecified joint: Secondary | ICD-10-CM | POA: Diagnosis not present

## 2015-10-21 DIAGNOSIS — Z170 Estrogen receptor positive status [ER+]: Secondary | ICD-10-CM

## 2015-10-21 DIAGNOSIS — C50412 Malignant neoplasm of upper-outer quadrant of left female breast: Secondary | ICD-10-CM

## 2015-10-21 DIAGNOSIS — N951 Menopausal and female climacteric states: Secondary | ICD-10-CM

## 2015-10-21 DIAGNOSIS — R232 Flushing: Secondary | ICD-10-CM

## 2015-10-21 MED ORDER — NAPROXEN 500 MG PO TABS: 500.0000 mg | ORAL_TABLET | Freq: Two times a day (BID) | ORAL | Status: DC

## 2015-10-21 MED ORDER — ANASTROZOLE 1 MG PO TABS: 1.0000 mg | ORAL_TABLET | Freq: Every day | ORAL | Status: DC

## 2015-10-21 MED ORDER — OMEPRAZOLE 40 MG PO CPDR
40.0000 mg | DELAYED_RELEASE_CAPSULE | Freq: Every day | ORAL | Status: DC
Start: 2015-10-21 — End: 2017-03-06

## 2015-10-21 NOTE — Progress Notes (Signed)
[facility]  [phone] [phone]    ID: Kimberly [name] DOB: 07/20/1971  MR#: [id number]  CSN#:[id number]  Patient Care Team: Jazma Y Phelps, DO as PCP - General Matthew Wakefield, MD as Consulting Physician (General Surgery) Gustav C Magrinat, MD as Consulting Physician (Oncology) Sarah Squire, MD as Attending Physician (Radiation Oncology) Carol L. Sena MD  CHIEF COMPLAINT: Estrogen receptor positive breast cancer  CURRENT TREATMENT: Anastrozole  BREAST CANCER HISTORY: From the original intake note:  The patient had bilateral screening mammography with tomography 03/19/2014. This was the patient's first ever mammography. Showed a possible mass in the left breast. Left diagnostic mammography and ultrasonography 04/01/2014 showed an irregular mass in the upper outer quadrant of the left breast with a possible satellite 1 cm anterior to it. On physical exam there was a firm palpable mass at the 1:00 position of the left breast 8 cm from the nipple. There was no palpable left axillary adenopathy. Ultrasound showed an irregular hypoechoic mass in the area in question measuring 1.4 cm. There was a 4 mm nodule located anterior to this. Ultrasound of the left axilla was benign.  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 The second mass was negative for malignancy. This was felt to be concordant.  On 04/08/2014 the patient underwent bilateral breast MRI. This showed a 9 mm enhancing mass in the subareolar area of the right breast in in the left breast, the previously noted mass measuring 1.3 cm. There were no morphologically abnormal lymph nodes  The right  breast finding was followed up on 04/19/2014 with ultrasound which showed an intraductal soft tissue mass in the inferior subareolar worsening of the right breast measuring 8 mm. This was biopsied 04/19/2014 and showed ([id number]) ectatic duct, with no evidence of malignancy. Surgical excision was recommended.  Accordingly on 05/03/2014 the patient underwent right lumpectomy, showing an intraductal papilloma, with known malignancy identified. Left lumpectomy on the same day showed an invasive ductal carcinoma measuring 1.5 cm, with one of the 2 sentinel lymph nodes positive for carcinoma. There was no extracapsular extension. Margins were clear and ample. HER-2 was repeated and was again negative.  The patient's case was discussed in the multidisciplinary breast cancer conference 05/12/2014. An Oncotype had been previously requested and this showed a recurrent score of 22, in the intermediate range. The patient qualifies for the S1007 study and this will be discussed with her. Otherwise the standard recommendation would be chemotherapy followed by radiation followed by anti-estrogens.  The patient's subsequent history is as detailed below.  INTERVAL HISTORY: Kimberly returns today for follow-up of her breast cancer accompanied by her husband [name] and their 3 children. She has been on anastrozole since March of this year, and just now understands that her hot flashes and vaginal dryness may be as a result of this addition. She has chronic joint pain and stiffness. It is unclear how much anastrozole has changed this. She may have fibromyalgia as the pain is so widespread. She takes NSAIDs PRN which are moderately helpful. The interval history is remarkable for an ED visit for atypical chest pain. She was ruled out for a PE and the troponins were negative.   REVIEW OF   SYSTEMS: Kimberly denies fevers, chills ,nausea, or vomiting. She has chronically loose stools. She has episodes of urinary  incontinence. Her appetite is good. She is chronically fatigued. She has shortness of breath with exertion and uses and inhaler PRN. She has frequent headaches. She endorses anxiety, depression, and a drop in libido. She is getting counseling and psychotropics through the [facility]. She wants to quit smoking but chantix is too expensive. A detailed review of systems is otherwise stable.  PAST MEDICAL HISTORY: Past Medical History  Diagnosis Date  . Anemia   . Arthritis   . Bipolar 1 disorder (HCC)   . Blood transfusion without reported diagnosis 2007    post bleeding childbirth  . Cancer (HCC)     eval  lt breast cancer  . Heart murmur     as a child-not adult-no cardiac work up  . Diabetes mellitus without complication (HCC)     gestational  . History of radiation therapy 08/31/14-10/21/14    left breast/ left supraclavicular 50.4 Gy 28 fx, lef tposterior axillary boost 9.52 Gy 28 fx, left rbeast boost/ 10 Gy 5 fx    PAST SURGICAL HISTORY: Past Surgical History  Procedure Laterality Date  . Tonsillectomy and adenoidectomy    . Dilation and curettage of uterus      after childbirth  . Multiple tooth extractions      only 5 left  . Breast biopsy Left 04/01/14  . Breast biopsy Right 04/19/14  . Breast lumpectomy with axillary lymph node dissection Bilateral 05/03/14  . Portacath placement Right 06/18/2014    Procedure: INSERTION PORT-A-CATH;  Surgeon: Matthew Wakefield, MD;  Location: [facility];  Service: General;  Laterality: Right;    FAMILY HISTORY Family History  Problem Relation Age of Onset  . Heart disease Father   . Cancer Father     Prostate   The patient's father is currently living at age 70. The patient's mother was murdered at age 58 by the patient's mother's sister. The patient has 2 brothers, one sister. The patient's father hase prostate cancer, diagnosed at age 70. There is no history of breast or ovarian cancer in the family.  GYNECOLOGIC  HISTORY:  No LMP recorded. Patient is not currently having periods (Reason: Irregular Periods). Menarche age 15, first live birth age 27, the patient is GX P5. The patient has not had a period in the last 2 months but tells me she has tested for pregnancy x2 and she is not pregnant.  SOCIAL HISTORY:   the patient tells me she is disabled secondary to a nervous breakdown. She is currently in her third marriage. Her husband, [name] Potempa, is retired from the military. He is disabled secondary to seizures. The patient's children are Alberto and Hague Hinojos, age 18 and 16, Francois Phillips age 10,  and Zion and Dakota Schrager age 9 and 8. In addition a nephew, Jeff, 16, lives with her family.     ADVANCED DIRECTIVES:  not in place    HEALTH MAINTENANCE: Social History  Substance Use Topics  . Smoking status: Current Every Day Smoker -- 1.00 packs/day for 32 years    Types: Cigarettes  . Smokeless tobacco: Never Used     Comment: 11/26/14 smoking 1 pack every 2 days  . Alcohol Use: No     Colonoscopy:  PAP:  Bone density:  Lipid panel:  No Known Allergies  Current Outpatient Prescriptions  Medication Sig Dispense Refill  . anastrozole (ARIMIDEX) 1 MG tablet   TAKE 1 TABLET BY MOUTH EVERY DAY 30 tablet 0  . citalopram (CELEXA) 40 MG tablet Take 40 mg by mouth daily.    . clonazePAM (KLONOPIN) 1 MG tablet Take 1 mg by mouth 3 (three) times daily.     . ferrous sulfate 325 (65 FE) MG tablet Take 1 tablet (325 mg total) by mouth daily with breakfast. 90 tablet 3  . lamoTRIgine (LAMICTAL) 200 MG tablet Take 1 tablet (200 mg total) by mouth 2 (two) times daily. 28 tablet 0  . naproxen (NAPROSYN) 500 MG tablet Take 1 tablet (500 mg total) by mouth 2 (two) times daily with a meal. 90 tablet 4  . omeprazole (PRILOSEC) 40 MG capsule Take 1 capsule (40 mg total) by mouth daily. 30 capsule 12  . prazosin (MINIPRESS) 2 MG capsule     . QUEtiapine (SEROQUEL) 200 MG tablet Take 1 tablet (200 mg total)  by mouth 2 (two) times daily. 60 tablet 5  . risperiDONE (RISPERDAL) 3 MG tablet Take 3 mg by mouth daily.    . albuterol (PROVENTIL HFA;VENTOLIN HFA) 108 (90 BASE) MCG/ACT inhaler Inhale 2 puffs into the lungs every 6 (six) hours as needed for wheezing or shortness of breath. (Patient not taking: Reported on 10/21/2015) 1 Inhaler 2  . ipratropium (ATROVENT) 0.06 % nasal spray Place 2 sprays into both nostrils 4 (four) times daily. (Patient not taking: Reported on 10/21/2015) 15 mL 0  . varenicline (CHANTIX) 0.5 MG tablet Take 1 tablet (0.5 mg total) by mouth 2 (two) times daily. (Patient not taking: Reported on 10/21/2015) 60 tablet 3   No current facility-administered medications for this visit.    OBJECTIVE: middle-aged [place] woman who appears older than stated age Filed Vitals:   10/21/15 1150  BP: 139/83  Pulse: 95  Temp: 97.7 F (36.5 C)  Resp: 20     Body mass index is 46.01 kg/(m^2).    ECOG FS:2 - Symptomatic, <50% confined to bed  Skin: warm, dry  HEENT: sclerae anicteric, conjunctivae pink, oropharynx clear. No thrush or mucositis.  Lymph Nodes: No cervical or supraclavicular lymphadenopathy  Lungs: clear to auscultation bilaterally, no rales, wheezes, or rhonci  Heart: regular rate and rhythm  Abdomen: round, soft, non tender, positive bowel sounds  Musculoskeletal: No focal spinal tenderness, no peripheral edema  Neuro: non focal, well oriented, positive affect  Breasts: left breast status post lumpectomy and radiation. Mild residual hyperpigmentation noted. No evidence of recurrent disease. Left axilla benign. Right breast unremarkable.   LAB RESULTS:  CMP     Component Value Date/Time   NA 138 10/07/2015 1204   NA 139 01/19/2015 1422   K 3.8 10/07/2015 1204   K 3.8 01/19/2015 1422   CL 102 10/07/2015 1204   CO2 22 10/07/2015 1141   CO2 23 01/19/2015 1422   GLUCOSE 92 10/07/2015 1204   GLUCOSE 93 01/19/2015 1422   BUN 7 10/07/2015 1204   BUN 8.6 01/19/2015  1422   CREATININE 0.90 10/07/2015 1204   CREATININE 1.1 01/19/2015 1422   CREATININE 0.94 02/25/2014 0926   CALCIUM 9.4 10/07/2015 1141   CALCIUM 9.3 01/19/2015 1422   PROT 7.3 01/19/2015 1422   PROT 7.3 07/10/2014 1850   ALBUMIN 3.9 01/19/2015 1422   ALBUMIN 3.8 07/10/2014 1850   AST 14 01/19/2015 1422   AST 17 07/10/2014 1850   ALT 13 01/19/2015 1422   ALT 18 07/10/2014 1850   ALKPHOS 82 01/19/2015 1422   ALKPHOS 103 07/10/2014 1850     BILITOT 0.35 01/19/2015 1422   BILITOT 0.4 07/10/2014 1850   GFRNONAA >60 10/07/2015 1141   GFRAA >60 10/07/2015 1141    I No results found for: SPEP  Lab Results  Component Value Date   WBC 5.7 10/07/2015   NEUTROABS 2.9 01/19/2015   HGB 16.7* 10/07/2015   HCT 49.0* 10/07/2015   MCV 82.8 10/07/2015   PLT 215 10/07/2015      Chemistry      Component Value Date/Time   NA 138 10/07/2015 1204   NA 139 01/19/2015 1422   K 3.8 10/07/2015 1204   K 3.8 01/19/2015 1422   CL 102 10/07/2015 1204   CO2 22 10/07/2015 1141   CO2 23 01/19/2015 1422   BUN 7 10/07/2015 1204   BUN 8.6 01/19/2015 1422   CREATININE 0.90 10/07/2015 1204   CREATININE 1.1 01/19/2015 1422   CREATININE 0.94 02/25/2014 0926      Component Value Date/Time   CALCIUM 9.4 10/07/2015 1141   CALCIUM 9.3 01/19/2015 1422   ALKPHOS 82 01/19/2015 1422   ALKPHOS 103 07/10/2014 1850   AST 14 01/19/2015 1422   AST 17 07/10/2014 1850   ALT 13 01/19/2015 1422   ALT 18 07/10/2014 1850   BILITOT 0.35 01/19/2015 1422   BILITOT 0.4 07/10/2014 1850       Lab Results  Component Value Date   LABCA2 28 04/30/2014    No components found for: [id number]  No results for input(s): INR in the last 168 hours.  Urinalysis    Component Value Date/Time   COLORURINE YELLOW 07/10/2014 1951   APPEARANCEUR CLEAR 07/10/2014 1951   LABSPEC 1.017 07/10/2014 1951   PHURINE 6.0 07/10/2014 1951   GLUCOSEU NEGATIVE 07/10/2014 1951   HGBUR NEGATIVE 07/10/2014 1951   BILIRUBINUR NEGATIVE  07/10/2014 1951   KETONESUR NEGATIVE 07/10/2014 1951   PROTEINUR NEGATIVE 07/10/2014 1951   UROBILINOGEN 0.2 07/10/2014 1951   NITRITE NEGATIVE 07/10/2014 1951   LEUKOCYTESUR TRACE* 07/10/2014 1951    STUDIES: Dg Chest 2 View  10/07/2015  CLINICAL DATA:  Chest pain EXAM: CHEST  2 VIEW COMPARISON:  11/30/2014 FINDINGS: Right subclavian Port-A-Cath tip in the mid SVC unchanged. Mild kinking of the proximal catheter unchanged. Surgical clips in the left axilla. Heart size and vascularity within normal limits. Negative for heart failure. Lungs are clear without infiltrate effusion or mass lesion. IMPRESSION: No active cardiopulmonary disease. Electronically Signed   By: Franchot Gallo M.D.   On: 10/07/2015 12:42   Ct Angio Chest Pe W/cm &/or Wo Cm  10/07/2015  CLINICAL DATA:  Chest pain and cough.  History of breast carcinoma EXAM: CT ANGIOGRAPHY CHEST WITH CONTRAST TECHNIQUE: Multidetector CT imaging of the chest was performed using the standard protocol during bolus administration of intravenous contrast. Multiplanar CT image reconstructions and MIPs were obtained to evaluate the vascular anatomy. CONTRAST:  18m OMNIPAQUE IOHEXOL 350 MG/ML SOLN COMPARISON:  Chest CT July 10, 2014 ; chest radiograph October 07, 2015 FINDINGS: There is no demonstrable pulmonary embolus. There is no thoracic aortic aneurysm or dissection. The visualized great vessels appear unremarkable. There is modest atherosclerotic calcification in the aortic arch region. There is no lung edema or consolidation. No parenchymal lung nodular lesions are identified. Visualized thyroid appears normal. There is no demonstrable thoracic adenopathy. The pericardium is not thickened. The right azygos vein is mildly prominent and nonenhancing at this time, likely due to timing of the contrast bolus. In the visualized upper abdomen, there is cholelithiasis. There is slight  left adrenal hypertrophy. Visualized upper abdominal structures  otherwise appear unremarkable. There are no blastic or lytic bone lesions. Review of the MIP images confirms the above findings. IMPRESSION: No demonstrable pulmonary embolus. No edema or consolidation. No apparent adenopathy. There is cholelithiasis. Electronically Signed   By: Lowella Grip III M.D.   On: 10/07/2015 14:37     ASSESSMENT: 44 y.o. BRCA negative [place] woman status post left lumpectomy and sentinel lymph node sampling 05/03/2014 for a pT1c pN0, stage IA invasive ductal carcinoma, grade 2, estrogen receptor 83% positive, progesterone receptor 66% positive, with an MIB-1 of 14% and no HER-2 amplification.  (1) Oncotype score of 22 predicts a risk of outside the breast recurrence within 10 years of 13% if the patient's only systemic therapy is tamoxifen for 5 years.  (2) adjuvant chemotherapy with cyclophosphamide and docetaxel started 07/06/2014, patient tolerated chemotherapy very poorly and it was discontinued after one cycle.    (3) adjuvant radiation completed 10/21/2014 1) Left Breast / 50.4 Gy in 28 fractions 2) Left supraclavicular / 50.4 Gy in 28 fractions 3) Left Posterior axillary boost / 9.52 Gy in 28 fractions 4) Left Breast Boost / 10 Gy in 5 fractions  (4) anastrozole started 01/19/2015  PLAN: Kimberly is stable by exam today. She is overdue for her yearly mammogram, so I have placed orders for that to be performed within in the next month. She is managing ok with the anastrozole, despite hot flashes and vaginal dryness. The join pain to some degree existed prior to anastrozole use, but I'm sure some of the stiffness can be attributed to this drug. Nevertheless she is complaint with the prescription. She will continue to use naproxen for the pain. I have encouraged physical activity, light stretching, and weight loss as possible interventions for this symptom.   Kimberly will return in April for follow up. She understands and agrees with this plan. She  knows the goal of treatment in her case is cure. She has been encouraged to cal with any issues that might arise before her next visit here.   Laurie Panda, NP  10/21/2015 1:04 PM

## 2015-10-21 NOTE — Telephone Encounter (Signed)
Appointments made and avs printed for patient °

## 2015-10-26 ENCOUNTER — Ambulatory Visit (HOSPITAL_COMMUNITY): Payer: Commercial Managed Care - HMO | Attending: Family Medicine

## 2015-11-25 DIAGNOSIS — F3013 Manic episode, severe, without psychotic symptoms: Secondary | ICD-10-CM | POA: Diagnosis not present

## 2015-12-21 ENCOUNTER — Encounter: Payer: Self-pay | Admitting: Family Medicine

## 2015-12-21 ENCOUNTER — Ambulatory Visit (INDEPENDENT_AMBULATORY_CARE_PROVIDER_SITE_OTHER): Payer: Commercial Managed Care - HMO | Admitting: Family Medicine

## 2015-12-21 VITALS — BP 152/106 | HR 131 | Temp 98.2°F | Wt 311.0 lb

## 2015-12-21 DIAGNOSIS — J45901 Unspecified asthma with (acute) exacerbation: Secondary | ICD-10-CM

## 2015-12-21 MED ORDER — PREDNISONE 20 MG PO TABS: 60.0000 mg | ORAL_TABLET | Freq: Every day | ORAL | Status: DC

## 2015-12-21 MED ORDER — ALBUTEROL SULFATE (2.5 MG/3ML) 0.083% IN NEBU
2.5000 mg | INHALATION_SOLUTION | Freq: Once | RESPIRATORY_TRACT | Status: AC
  Administered 2015-12-21: 2.5 mg via RESPIRATORY_TRACT

## 2015-12-21 MED ORDER — BENZONATATE 100 MG PO CAPS: 100.0000 mg | ORAL_CAPSULE | Freq: Two times a day (BID) | ORAL | Status: DC | PRN

## 2015-12-21 MED ORDER — IPRATROPIUM BROMIDE 0.02 % IN SOLN
0.5000 mg | Freq: Once | RESPIRATORY_TRACT | Status: AC
  Administered 2015-12-21: 0.5 mg via RESPIRATORY_TRACT

## 2015-12-21 NOTE — Patient Instructions (Addendum)
Thank you for coming to see me today. It was a pleasure. Today we talked about:   Asthma exacberbation: I want you to use your albuterol inhaler every 4 hours while awake. I am prescribing prednisone for you to take for 5 days. If your symptoms worsen, please go straight to the emergency department.  Please make an appointment to see Dr. Gerarda Fraction in 1 week.  If you have any questions or concerns, please do not hesitate to call the office at [phone].  Sincerely,  Cordelia Poche, MD

## 2015-12-21 NOTE — Progress Notes (Signed)
    Subjective   Kimberly [name] is a 45 y.o. female that presents for a same day visit  1. Asthma exacerbation: Symptoms started four days ago. She has coughing, wheezing and dyspnea. She has had trouble sleeping at night because . She has been using her nebulizer every four hours with little relief. She is taking her Atrovent. She reports never being hospitalized. She has some rhinorrhea, sneezing that preceded the wheezing. She has been taking a lot of cold medications which help with her cough.  ROS Per HPI  Social History  Substance Use Topics  . Smoking status: Current Every Day Smoker -- 1.00 packs/day for 32 years    Types: Cigarettes  . Smokeless tobacco: Never Used     Comment: 11/26/14 smoking 1 pack every 2 days  . Alcohol Use: No    No Known Allergies  Objective   BP 152/106 mmHg  Pulse 131  Temp(Src) 98.2 F (36.8 C) (Oral)  Wt 311 lb (141.069 kg) SpO2: 93%  General: Initially appeared in mild distress, improved with duonebs HEENT:   Head:  Normocephalic  Ears: Tympanic membranes normal bilaterally.  Nose/Throat: Nares patent bilaterally. Oropharnx clear and moist.  Neck: No cervical adenopathy bilaterally Respiratory/Chest: Wheezing throughout with diminished breath sounds, mild supraclavicular muscle use and tachypnea.   Assessment and Plan   1. Asthma with acute exacerbation, unspecified asthma severity - improved with treatment in office. Patient is against transfer to ED and admission. Exam significantly improved and feel patient can start treatment with steroid burst. Strict return precautions discussed and reasons for heading to the ED - predniSONE (DELTASONE) 20 MG tablet; Take 3 tablets (60 mg total) by mouth daily with breakfast.  Dispense: 15 tablet; Refill: 0 - benzonatate (TESSALON) 100 MG capsule; Take 1 capsule (100 mg total) by mouth 2 (two) times daily as needed for cough.  Dispense: 20 capsule; Refill: 0 - albuterol (PROVENTIL) (2.5 MG/3ML)  0.083% nebulizer solution 2.5 mg; Take 3 mLs (2.5 mg total) by nebulization once. - ipratropium (ATROVENT) nebulizer solution 0.5 mg; Take 2.5 mLs (0.5 mg total) by nebulization once.

## 2015-12-23 ENCOUNTER — Ambulatory Visit
Admission: RE | Admit: 2015-12-23 | Discharge: 2015-12-23 | Disposition: A | Discharge status: Home / Self Care | Payer: Commercial Managed Care - HMO | Source: Ambulatory Visit | Attending: Oncology | Admitting: Oncology

## 2015-12-23 DIAGNOSIS — R928 Other abnormal and inconclusive findings on diagnostic imaging of breast: Secondary | ICD-10-CM | POA: Diagnosis not present

## 2015-12-23 DIAGNOSIS — Z853 Personal history of malignant neoplasm of breast: Secondary | ICD-10-CM

## 2015-12-26 ENCOUNTER — Encounter (HOSPITAL_COMMUNITY): Payer: Self-pay | Admitting: Emergency Medicine

## 2015-12-26 ENCOUNTER — Emergency Department (INDEPENDENT_AMBULATORY_CARE_PROVIDER_SITE_OTHER): Payer: Commercial Managed Care - HMO

## 2015-12-26 ENCOUNTER — Emergency Department (INDEPENDENT_AMBULATORY_CARE_PROVIDER_SITE_OTHER)
Admission: EM | Admit: 2015-12-26 | Discharge: 2015-12-26 | Disposition: A | Discharge status: Home / Self Care | Payer: Commercial Managed Care - HMO | Source: Home / Self Care | Attending: Family Medicine | Admitting: Family Medicine

## 2015-12-26 DIAGNOSIS — R0602 Shortness of breath: Secondary | ICD-10-CM | POA: Diagnosis not present

## 2015-12-26 DIAGNOSIS — R05 Cough: Secondary | ICD-10-CM | POA: Diagnosis not present

## 2015-12-26 DIAGNOSIS — J209 Acute bronchitis, unspecified: Secondary | ICD-10-CM

## 2015-12-26 MED ORDER — AZITHROMYCIN 250 MG PO TABS: ORAL_TABLET | ORAL | Status: DC

## 2015-12-26 MED ORDER — HYDROCOD POLST-CPM POLST ER 10-8 MG/5ML PO SUER: 5.0000 mL | Freq: Two times a day (BID) | ORAL | Status: DC | PRN

## 2015-12-26 NOTE — ED Notes (Signed)
Pt here with continued cough, sob and wheezing after prescribed Prednisone, Albuterol inhaler/ Duo nebs at doctors last week Pt has completed steroids yesterday with last treatment 2 hrs ago without relief Pt was instructed to f/u with PCP in 1 week but did not schedule Sats 97% RA,tachycardic but no distress

## 2015-12-26 NOTE — Discharge Instructions (Signed)

## 2015-12-26 NOTE — ED Provider Notes (Signed)
CSN: [id number]     Arrival date & time 12/26/15  1314 History   First MD Initiated Contact with Patient 12/26/15 1445     Chief Complaint  Patient presents with  . Wheezing  . Cough   (Consider location/radiation/quality/duration/timing/severity/associated sxs/prior Treatment) HPI History obtained from patient:  Pt presents with the cc [id number], wheezing. Symptoms started: almost 1 week ago. Symptoms: treated by PCP with prednisone, but no change in cough. Unable to sleep. Has tried sleeping in an recliner. Has not spoken with PCP to let him know she is not getting better.   symptoms of this nature: pneumonia a couple of years ago.  Pain score: Other symptoms include: Using nebulizer and inhaler at home.  States she feels she needs an antibx.  Past Medical History  Diagnosis Date  . Anemia   . Arthritis   . Bipolar 1 disorder ([place])   . Blood transfusion without reported diagnosis 2007    post bleeding childbirth  . Cancer ([place])     eval  lt breast cancer  . Heart murmur     as a child-not adult-no cardiac work up  . Diabetes mellitus without complication ([place])     gestational  . History of radiation therapy 08/31/14-10/21/14    left breast/ left supraclavicular 50.4 Gy 28 fx, lef tposterior axillary boost 9.52 Gy 28 fx, left rbeast boost/ 10 Gy 5 fx   Past Surgical History  Procedure Laterality Date  . Tonsillectomy and adenoidectomy    . Dilation and curettage of uterus      after childbirth  . Multiple tooth extractions      only 5 left  . Breast biopsy Left 04/01/14  . Breast biopsy Right 04/19/14  . Breast lumpectomy with axillary lymph node dissection Bilateral 05/03/14  . Portacath placement Right 06/18/2014    Procedure: INSERTION PORT-A-CATH;  Surgeon: [name], MD;  Location: [facility];  Service: General;  Laterality: Right;   Family History  Problem Relation Age of Onset  . Heart disease Father   . Cancer Father     Prostate    Social History  Substance Use Topics  . Smoking status: Current Every Day Smoker -- 1.00 packs/day for 32 years    Types: Cigarettes  . Smokeless tobacco: Never Used     Comment: 11/26/14 smoking 1 pack every 2 days  . Alcohol Use: No   OB History    No data available     Review of Systems ROS +'ve wheezing  Denies: HEADACHE, NAUSEA, ABDOMINAL PAIN, CHEST PAIN, CONGESTION, DYSURIA, SHORTNESS OF BREATH  Allergies  Review of patient's allergies indicates no known allergies.  Home Medications   Prior to Admission medications   Medication Sig Start Date End Date Taking? Authorizing Provider  albuterol (PROVENTIL HFA;VENTOLIN HFA) 108 (90 BASE) MCG/ACT inhaler Inhale 2 puffs into the lungs every 6 (six) hours as needed for wheezing or shortness of breath. Patient not taking: Reported on 10/21/2015 07/16/14   Minette Headland, NP  anastrozole (ARIMIDEX) 1 MG tablet Take 1 tablet (1 mg total) by mouth daily. 10/21/15   Laurie Panda, NP  benzonatate (TESSALON) 100 MG capsule Take 1 capsule (100 mg total) by mouth 2 (two) times daily as needed for cough. 12/21/15   Mariel Aloe, MD  citalopram (CELEXA) 40 MG tablet Take 40 mg by mouth daily.    Historical Provider, MD  clonazePAM (KLONOPIN) 1 MG tablet Take 1 mg by mouth 3 (three) times daily.  03/21/14   Historical Provider, MD  ferrous sulfate 325 (65 FE) MG tablet Take 1 tablet (325 mg total) by mouth daily with breakfast. 09/29/15   Donnamae Jude, MD  ipratropium (ATROVENT) 0.06 % nasal spray Place 2 sprays into both nostrils 4 (four) times daily. Patient not taking: Reported on 10/21/2015 11/30/14   Melony Overly, MD  lamoTRIgine (LAMICTAL) 200 MG tablet Take 1 tablet (200 mg total) by mouth 2 (two) times daily. 07/25/15   Ashly Windell Moulding, DO  naproxen (NAPROSYN) 500 MG tablet Take 1 tablet (500 mg total) by mouth 2 (two) times daily with a meal. 10/21/15   Laurie Panda, NP  omeprazole (PRILOSEC) 40 MG capsule Take 1 capsule  (40 mg total) by mouth daily. 10/21/15   Laurie Panda, NP  prazosin (MINIPRESS) 2 MG capsule  07/01/14   Historical Provider, MD  predniSONE (DELTASONE) 20 MG tablet Take 3 tablets (60 mg total) by mouth daily with breakfast. 12/21/15   Mariel Aloe, MD  QUEtiapine (SEROQUEL) 200 MG tablet Take 1 tablet (200 mg total) by mouth 2 (two) times daily. 09/15/14   [name], DO  risperiDONE (RISPERDAL) 3 MG tablet Take 3 mg by mouth daily. 08/11/14   Historical Provider, MD  varenicline (CHANTIX) 0.5 MG tablet Take 1 tablet (0.5 mg total) by mouth 2 (two) times daily. Patient not taking: Reported on 10/21/2015 09/29/15   Donnamae Jude, MD   Meds Ordered and Administered this Visit  Medications - No data to display  BP 140/83 mmHg  Pulse 117  Resp 44  SpO2 97% No data found.   Physical Exam NURSES NOTES AND VITAL SIGNS REVIEWED. CONSTITUTIONAL: Well developed, well nourished, no acute distress HEENT: normocephalic, atraumatic, right and left TM's are normal EYES: Conjunctiva normal NECK:normal ROM, supple, no adenopathy PULMONARY:No respiratory distress, normal effort, Lungs: CTAb/l, no wheezes, or increased work of breathing, Abnormal BS are exaggerated and are much improved when patient is distracted with conversation. CARDIOVASCULAR: RRR, no murmur ABDOMEN: soft, ND, NT, +'ve BS MUSCULOSKELETAL: Normal ROM of all extremities,  SKIN: warm and dry without rash PSYCHIATRIC: Mood and affect, behavior are normal  ED Course  Procedures (including critical care time)  Labs Review Labs Reviewed - No data to display  Imaging Review Dg Chest 2 View  12/26/2015  CLINICAL DATA:  Cough and shortness of Breath EXAM: CHEST  2 VIEW COMPARISON:  10/07/2015 FINDINGS: Cardiac shadow is within normal limits. The lungs are clear bilaterally. A right-sided chest wall port is noted. No acute bony abnormality is seen. IMPRESSION: No acute abnormality noted. Electronically Signed   By: Inez Catalina  M.D.   On: 12/26/2015 15:46     Visual Acuity Review  Right Eye Distance:   Left Eye Distance:   Bilateral Distance:    Right Eye Near:   Left Eye Near:    Bilateral Near:      Review with pt and husband results of CXR. RX zpak  MDM   1. Bronchospasm with bronchitis, acute    Patient is reassured that there are no issues that require transfer to higher level of care at this time.  Patient is advised to continue home symptomatic treatment. Prescription is sent to  pharmacy patient has indicated.  Patient is advised that if there are new or worsening symptoms or attend the emergency department, or contact primary care provider. Instructions of care provided discharged home in stable condition. Return to work/school note provided.  THIS NOTE WAS GENERATED USING A VOICE RECOGNITION SOFTWARE PROGRAM. ALL REASONABLE EFFORTS  WERE MADE TO PROOFREAD THIS DOCUMENT FOR ACCURACY.     Konrad Felix, PA 12/26/15 1615

## 2016-01-03 ENCOUNTER — Other Ambulatory Visit: Payer: Self-pay | Admitting: *Deleted

## 2016-01-20 ENCOUNTER — Telehealth: Payer: Self-pay | Admitting: *Deleted

## 2016-01-20 NOTE — Telephone Encounter (Signed)
Received a fax from [facility] requesting Rx for Omgea-3-Acid Ethyl Esters caps and Lidocaine/Prilocaine Cream.  Forms placed in provider box for review.  Derl Barrow, RN

## 2016-01-20 NOTE — Telephone Encounter (Signed)
I think [facility] is one of those commercial pharmacies that call patient's about medications they can get. Patient has never had Rx for either of these scripts that I can see and if she wants to start on them we need to have a visit. Will not fill these.

## 2016-02-06 ENCOUNTER — Other Ambulatory Visit: Payer: Commercial Managed Care - HMO

## 2016-02-06 ENCOUNTER — Ambulatory Visit (HOSPITAL_BASED_OUTPATIENT_CLINIC_OR_DEPARTMENT_OTHER): Payer: Commercial Managed Care - HMO | Admitting: Oncology

## 2016-02-06 ENCOUNTER — Encounter: Payer: Self-pay | Admitting: Oncology

## 2016-02-06 DIAGNOSIS — C50412 Malignant neoplasm of upper-outer quadrant of left female breast: Secondary | ICD-10-CM

## 2016-02-06 NOTE — Progress Notes (Signed)
[facility]  [phone] [phone]    ID: Kimberly [name] DOB: Jun 12, 1971  MR#: [id number]  WYO#:[id number]  Patient Care Team: [name], DO as PCP - General [name], MD as Consulting Physician (General Surgery) [name], MD as Consulting Physician (Oncology) [name], MD as Attending Physician (Radiation Oncology) [name]  CHIEF COMPLAINT: Estrogen receptor positive breast cancer  CURRENT TREATMENT: Anastrozole  BREAST CANCER HISTORY: From the original intake note:  The patient had bilateral screening mammography with tomography 03/19/2014. This was the patient's first ever mammography. Showed a possible mass in the left breast. Left diagnostic mammography and ultrasonography 04/01/2014 showed an irregular mass in the upper outer quadrant of the left breast with a possible satellite 1 cm anterior to it. On physical exam there was a firm palpable mass at the 1:00 position of the left breast 8 cm from the nipple. There was no palpable left axillary adenopathy. Ultrasound showed an irregular hypoechoic mass in the area in question measuring 1.4 cm. There was a 4 mm nodule located anterior to this. Ultrasound of the left axilla was benign.  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 The second mass was negative for malignancy. This was felt to be concordant.  On 04/08/2014 the patient underwent bilateral breast MRI. This showed a 9 mm enhancing mass in the subareolar area of the right breast in in the left breast, the previously noted mass measuring 1.3 cm. There were no morphologically abnormal lymph nodes  The right  breast finding was followed up on 04/19/2014 with ultrasound which showed an intraductal soft tissue mass in the inferior subareolar worsening of the right breast measuring 8 mm. This was biopsied 04/19/2014 and showed ([id number]) ectatic duct, with no evidence of malignancy. Surgical excision was recommended.  Accordingly on 05/03/2014 the patient underwent right lumpectomy, showing an intraductal papilloma, with known malignancy identified. Left lumpectomy on the same day showed an invasive ductal carcinoma measuring 1.5 cm, with one of the 2 sentinel lymph nodes positive for carcinoma. There was no extracapsular extension. Margins were clear and ample. HER-2 was repeated and was again negative.  The patient's case was discussed in the multidisciplinary breast cancer conference 05/12/2014. An Oncotype had been previously requested and this showed a recurrent score of 22, in the intermediate range. The patient qualifies for the S1007 study and this will be discussed with her. Otherwise the standard recommendation would be chemotherapy followed by radiation followed by anti-estrogens.  The patient's subsequent history is as detailed below.  INTERVAL HISTORY: Kimberly [name] returns today for follow-up of her breast cancer accompanied by her husband [name] and their 3 children. She has been on anastrozole since March of this year, and just now understands that her hot flashes and vaginal dryness may be as a result of this addition. She has chronic joint pain and stiffness. It is unclear how much anastrozole has changed this. She may have fibromyalgia as the pain is so widespread. She takes NSAIDs PRN which are moderately helpful. The interval history is remarkable for an ED visit for atypical chest pain. She was ruled out for a PE and the troponins were negative.   REVIEW OF  SYSTEMS: Kimberly [name] denies fevers, chills ,nausea, or vomiting. She has chronically loose stools. She has episodes of urinary  incontinence. Her appetite is good. She is chronically fatigued. She has shortness of breath with exertion and uses and inhaler PRN. She has frequent headaches. She endorses anxiety, depression, and a drop in libido. She is getting counseling and psychotropics through the [facility]. She wants to quit smoking but chantix is too expensive. A detailed review of systems is otherwise stable.  PAST MEDICAL HISTORY: Past Medical History  Diagnosis Date  . Anemia   . Arthritis   . Bipolar 1 disorder ([place])   . Blood transfusion without reported diagnosis 2007    post bleeding childbirth  . Cancer ([place])     eval  lt breast cancer  . Heart murmur     as a child-not adult-no cardiac work up  . Diabetes mellitus without complication ([place])     gestational  . History of radiation therapy 08/31/14-10/21/14    left breast/ left supraclavicular 50.4 Gy 28 fx, lef tposterior axillary boost 9.52 Gy 28 fx, left rbeast boost/ 10 Gy 5 fx    PAST SURGICAL HISTORY: Past Surgical History  Procedure Laterality Date  . Tonsillectomy and adenoidectomy    . Dilation and curettage of uterus      after childbirth  . Multiple tooth extractions      only 5 left  . Breast biopsy Left 04/01/14  . Breast biopsy Right 04/19/14  . Breast lumpectomy with axillary lymph node dissection Bilateral 05/03/14  . Portacath placement Right 06/18/2014    Procedure: INSERTION PORT-A-CATH;  Surgeon: [name], MD;  Location: [facility];  Service: General;  Laterality: Right;    FAMILY HISTORY Family History  Problem Relation Age of Onset  . Heart disease Father   . Cancer Father     Prostate   The patient's father is currently living at age 88. The patient's mother was murdered at age 47 by the patient's mother's sister. The patient has 2 brothers, one sister. The patient's father hase prostate cancer, diagnosed at age 24. There is no history of breast or ovarian cancer in the family.  GYNECOLOGIC  HISTORY:  No LMP recorded. Patient is not currently having periods (Reason: Irregular Periods). Menarche age 34, first live birth age 43, the patient is [place] P5. The patient has not had a period in the last 2 months but tells me she has tested for pregnancy x2 and she is not pregnant.  SOCIAL HISTORY:   the patient tells me she is disabled secondary to a nervous breakdown. She is currently in her third marriage. Her husband, Caliah Kopke, is retired from [employer]. He is disabled secondary to seizures. The patient's children are [name] and [name], age 39 and 75, [name] age 34,  and [place] and [name] age 74 and 50. In addition a nephew, [name], 79, lives with her family.     ADVANCED DIRECTIVES:  not in place    HEALTH MAINTENANCE: Social History  Substance Use Topics  . Smoking status: Current Every Day Smoker -- 1.00 packs/day for 32 years    Types: Cigarettes  . Smokeless tobacco: Never Used     Comment: 11/26/14 smoking 1 pack every 2 days  . Alcohol Use: No     Colonoscopy:  PAP:  Bone density:  Lipid panel:  No Known Allergies  Current Outpatient Prescriptions  Medication Sig Dispense Refill  . albuterol (PROVENTIL HFA;VENTOLIN HFA) 108 (  90 BASE) MCG/ACT inhaler Inhale 2 puffs into the lungs every 6 (six) hours as needed for wheezing or shortness of breath. (Patient not taking: Reported on 10/21/2015) 1 Inhaler 2  . anastrozole (ARIMIDEX) 1 MG tablet Take 1 tablet (1 mg total) by mouth daily. 30 tablet 6  . azithromycin (ZITHROMAX Z-PAK) 250 MG tablet 2 pills day 1 then 1 pill daily for days 2-5 6 tablet 0  . benzonatate (TESSALON) 100 MG capsule Take 1 capsule (100 mg total) by mouth 2 (two) times daily as needed for cough. 20 capsule 0  . chlorpheniramine-HYDROcodone (TUSSIONEX PENNKINETIC ER) 10-8 MG/5ML SUER Take 5 mLs by mouth every 12 (twelve) hours as needed for cough. 115 mL 0  . citalopram (CELEXA) 40 MG tablet Take 40 mg by mouth daily.    .  clonazePAM (KLONOPIN) 1 MG tablet Take 1 mg by mouth 3 (three) times daily.     . ferrous sulfate 325 (65 FE) MG tablet Take 1 tablet (325 mg total) by mouth daily with breakfast. 90 tablet 3  . ipratropium (ATROVENT) 0.06 % nasal spray Place 2 sprays into both nostrils 4 (four) times daily. (Patient not taking: Reported on 10/21/2015) 15 mL 0  . lamoTRIgine (LAMICTAL) 200 MG tablet Take 1 tablet (200 mg total) by mouth 2 (two) times daily. 28 tablet 0  . naproxen (NAPROSYN) 500 MG tablet Take 1 tablet (500 mg total) by mouth 2 (two) times daily with a meal. 90 tablet 4  . omeprazole (PRILOSEC) 40 MG capsule Take 1 capsule (40 mg total) by mouth daily. 30 capsule 12  . prazosin (MINIPRESS) 2 MG capsule     . predniSONE (DELTASONE) 20 MG tablet Take 3 tablets (60 mg total) by mouth daily with breakfast. 15 tablet 0  . QUEtiapine (SEROQUEL) 200 MG tablet Take 1 tablet (200 mg total) by mouth 2 (two) times daily. 60 tablet 5  . risperiDONE (RISPERDAL) 3 MG tablet Take 3 mg by mouth daily.    . varenicline (CHANTIX) 0.5 MG tablet Take 1 tablet (0.5 mg total) by mouth 2 (two) times daily. (Patient not taking: Reported on 10/21/2015) 60 tablet 3   No current facility-administered medications for this visit.    OBJECTIVE: middle-aged [place] woman who appears older than stated age There were no vitals filed for this visit.   There is no weight on file to calculate BMI.    ECOG FS:2 - Symptomatic, <50% confined to bed  Skin: warm, dry  HEENT: sclerae anicteric, conjunctivae pink, oropharynx clear. No thrush or mucositis.  Lymph Nodes: No cervical or supraclavicular lymphadenopathy  Lungs: clear to auscultation bilaterally, no rales, wheezes, or rhonci  Heart: regular rate and rhythm  Abdomen: round, soft, non tender, positive bowel sounds  Musculoskeletal: No focal spinal tenderness, no peripheral edema  Neuro: non focal, well oriented, positive affect  Breasts: left breast status post lumpectomy  and radiation. Mild residual hyperpigmentation noted. No evidence of recurrent disease. Left axilla benign. Right breast unremarkable.   LAB RESULTS:  CMP     Component Value Date/Time   NA 138 10/07/2015 1204   NA 139 01/19/2015 1422   K 3.8 10/07/2015 1204   K 3.8 01/19/2015 1422   CL 102 10/07/2015 1204   CO2 22 10/07/2015 1141   CO2 23 01/19/2015 1422   GLUCOSE 92 10/07/2015 1204   GLUCOSE 93 01/19/2015 1422   BUN 7 10/07/2015 1204   BUN 8.6 01/19/2015 1422   CREATININE 0.90 10/07/2015 1204  CREATININE 1.1 01/19/2015 1422   CREATININE 0.94 02/25/2014 0926   CALCIUM 9.4 10/07/2015 1141   CALCIUM 9.3 01/19/2015 1422   PROT 7.3 01/19/2015 1422   PROT 7.3 07/10/2014 1850   ALBUMIN 3.9 01/19/2015 1422   ALBUMIN 3.8 07/10/2014 1850   AST 14 01/19/2015 1422   AST 17 07/10/2014 1850   ALT 13 01/19/2015 1422   ALT 18 07/10/2014 1850   ALKPHOS 82 01/19/2015 1422   ALKPHOS 103 07/10/2014 1850   BILITOT 0.35 01/19/2015 1422   BILITOT 0.4 07/10/2014 1850   GFRNONAA >60 10/07/2015 1141   GFRAA >60 10/07/2015 1141    I No results found for: SPEP  Lab Results  Component Value Date   WBC 5.7 10/07/2015   NEUTROABS 2.9 01/19/2015   HGB 16.7* 10/07/2015   HCT 49.0* 10/07/2015   MCV 82.8 10/07/2015   PLT 215 10/07/2015      Chemistry      Component Value Date/Time   NA 138 10/07/2015 1204   NA 139 01/19/2015 1422   K 3.8 10/07/2015 1204   K 3.8 01/19/2015 1422   CL 102 10/07/2015 1204   CO2 22 10/07/2015 1141   CO2 23 01/19/2015 1422   BUN 7 10/07/2015 1204   BUN 8.6 01/19/2015 1422   CREATININE 0.90 10/07/2015 1204   CREATININE 1.1 01/19/2015 1422   CREATININE 0.94 02/25/2014 0926      Component Value Date/Time   CALCIUM 9.4 10/07/2015 1141   CALCIUM 9.3 01/19/2015 1422   ALKPHOS 82 01/19/2015 1422   ALKPHOS 103 07/10/2014 1850   AST 14 01/19/2015 1422   AST 17 07/10/2014 1850   ALT 13 01/19/2015 1422   ALT 18 07/10/2014 1850   BILITOT 0.35 01/19/2015  1422   BILITOT 0.4 07/10/2014 1850       Lab Results  Component Value Date   LABCA2 28 04/30/2014    No components found for: [id number]  No results for input(s): INR in the last 168 hours.  Urinalysis    Component Value Date/Time   COLORURINE YELLOW 07/10/2014 1951   APPEARANCEUR CLEAR 07/10/2014 1951   LABSPEC 1.017 07/10/2014 1951   PHURINE 6.0 07/10/2014 1951   GLUCOSEU NEGATIVE 07/10/2014 1951   HGBUR NEGATIVE 07/10/2014 1951   BILIRUBINUR NEGATIVE 07/10/2014 1951   KETONESUR NEGATIVE 07/10/2014 1951   PROTEINUR NEGATIVE 07/10/2014 1951   UROBILINOGEN 0.2 07/10/2014 1951   NITRITE NEGATIVE 07/10/2014 1951   LEUKOCYTESUR TRACE* 07/10/2014 1951    STUDIES: No results found.   ASSESSMENT: 45 y.o. BRCA negative [place] woman status post left lumpectomy and sentinel lymph node sampling 05/03/2014 for a pT1c pN0, stage IA invasive ductal carcinoma, grade 2, estrogen receptor 83% positive, progesterone receptor 66% positive, with an MIB-1 of 14% and no HER-2 amplification.  (1) Oncotype score of 22 predicts a risk of outside the breast recurrence within 10 years of 13% if the patient's only systemic therapy is tamoxifen for 5 years.  (2) adjuvant chemotherapy with cyclophosphamide and docetaxel started 07/06/2014, patient tolerated chemotherapy very poorly and it was discontinued after one cycle.    (3) adjuvant radiation completed 10/21/2014 1) Left Breast / 50.4 Gy in 28 fractions 2) Left supraclavicular / 50.4 Gy in 28 fractions 3) Left Posterior axillary boost / 9.52 Gy in 28 fractions 4) Left Breast Boost / 10 Gy in 5 fractions  (4) anastrozole started 01/19/2015  (a) bone density?  PLAN: Kimberly [name] is stable by exam today. She is overdue for her yearly mammogram,  so I have placed orders for that to be performed within in the next month. She is managing ok with the anastrozole, despite hot flashes and vaginal dryness. The join pain to some degree existed prior  to anastrozole use, but I'm sure some of the stiffness can be attributed to this drug. Nevertheless she is complaint with the prescription. She will continue to use naproxen for the pain. I have encouraged physical activity, light stretching, and weight loss as possible interventions for this symptom.   Kimberly [name] will return in April for follow up. She understands and agrees with this plan. She knows the goal of treatment in her case is cure. She has been encouraged to cal with any issues that might arise before her next visit here.   [name], MD  02/06/2016 9:33 AM

## 2016-03-25 IMAGING — MG MM DIGITAL DIAGNOSTIC UNILAT*L*
2 series · 2 of 2 positions shown · non-contrast
Comparison: Previous examinations.

CLINICAL DATA: RECALL FROM SCREENING MAMMOGRAM.

EXAM:
DIGITAL DIAGNOSTIC  LEFT BREAST MAMMOGRAM
ULTRASOUND LEFT BREAST

[L CC]
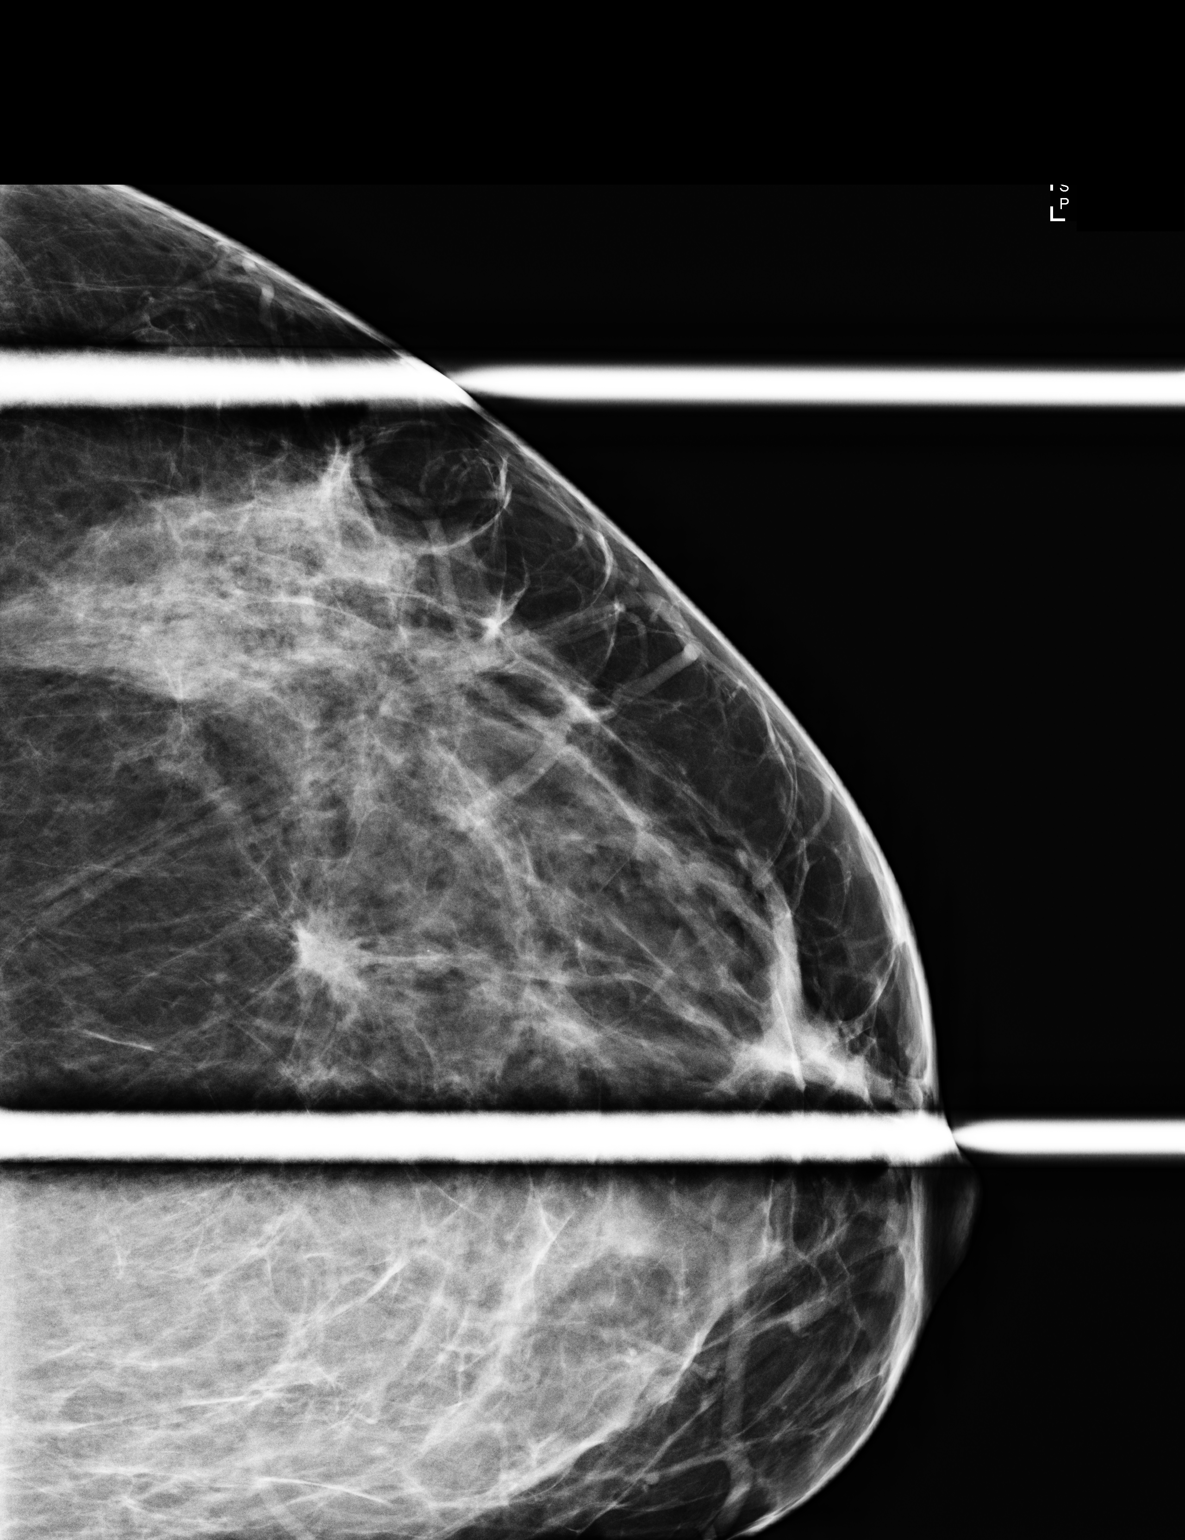

[L MLO]
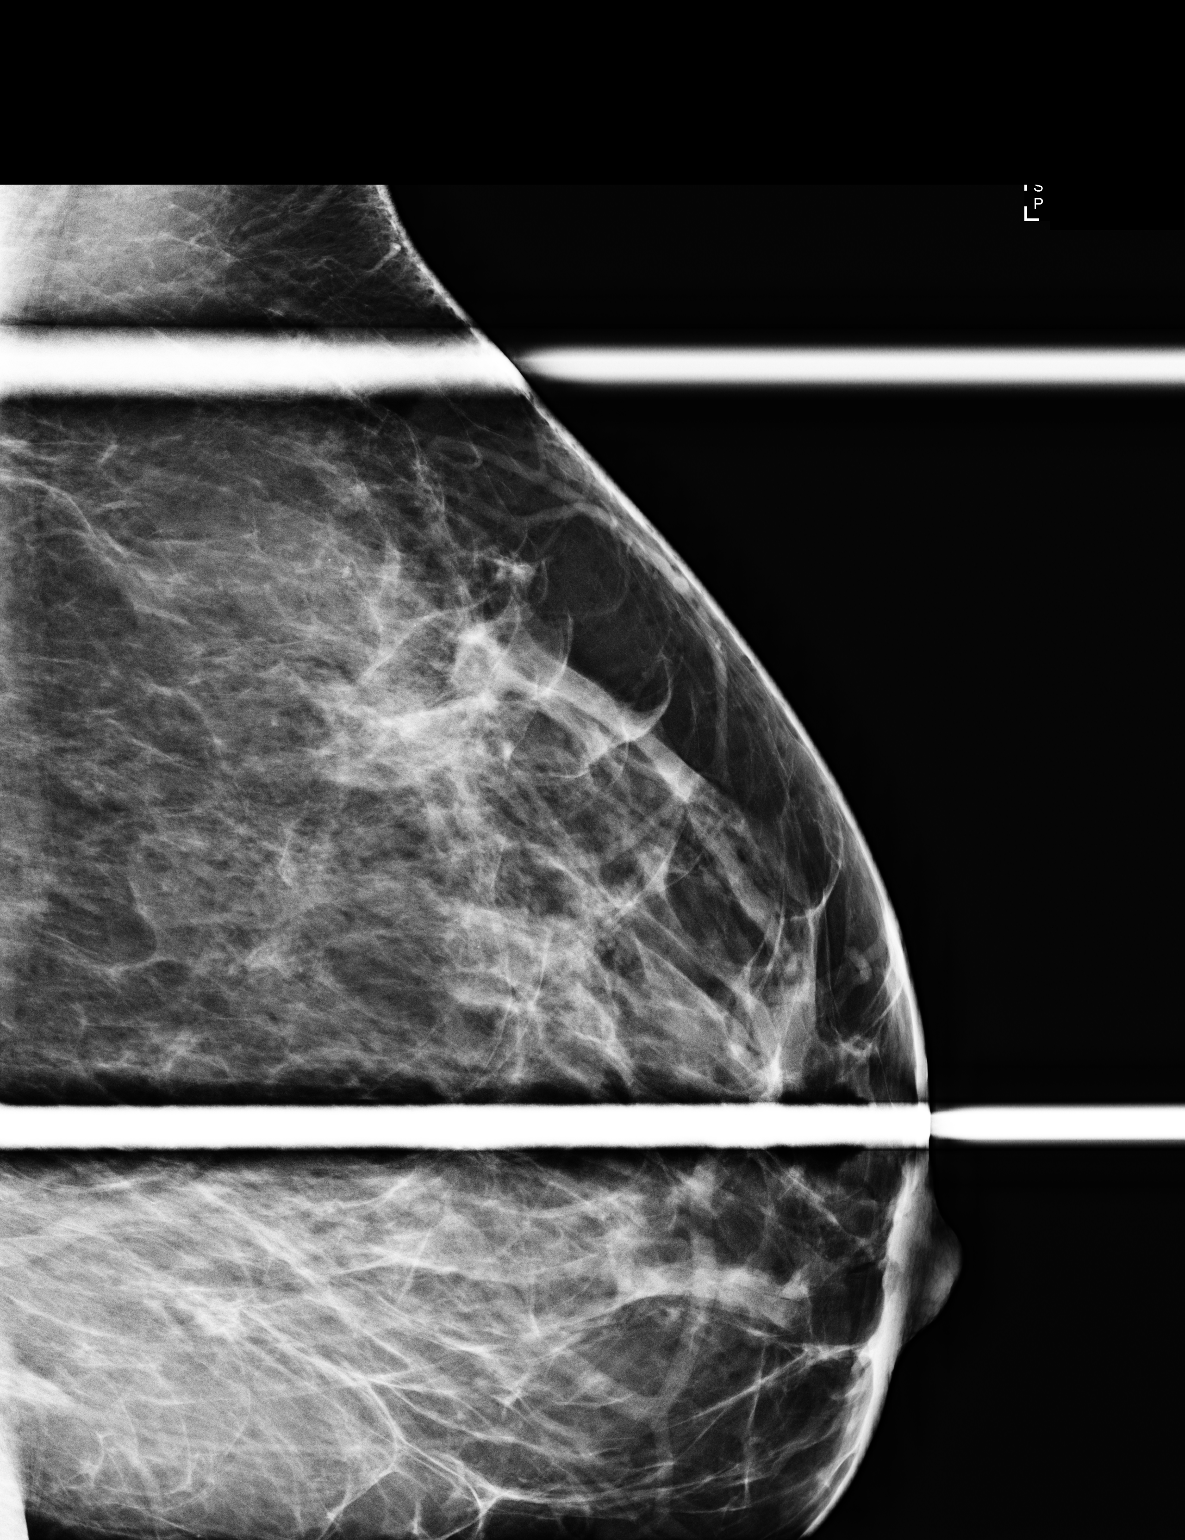

[2 of 2 positions shown; findings below may reference images not displayed]

ACR Breast Density Category c: The breast tissue is heterogeneously
dense, which may obscure small masses.
FINDINGS: Spot compression views of the left breast demonstrate an irregular
mass located within the upper outer quadrant left breast 1 o'clock
position. There may be a subtle small satellite nodule located
approximately 1 cm anterior to this mass.

On physical exam, there is a firm palpable mass located within the
left breast at 1 o'clock position 8 cm from the nipple which by
physical examination measures approximately 1 cm in size. There is
no palpable left axillary adenopathy.

Ultrasound is performed, showing an irregular hypoechoic mass with
shadowing located within the left breast at 1 o'clock position 8 cm
from the nipple corresponding to the palpable finding. This measures
1.4 x 1.0 x 0.6 cm in size and is worrisome for invasive mammary
carcinoma. There is a subtle small (4 mm) nodule located 9 mm
anterior and inferior to this larger mass which may represent a
small satellite nodule. I have discussed ultrasound-guided core
biopsy of the larger mass and this tiny satellite nodule with the
patient. She would like to proceed with this. This will be performed
and reported separately.

Ultrasound of the left axilla demonstrates normal axillary contents
and no evidence for adenopathy.
IMPRESSION: 1.4 cm irregular mass located within the left breast at the 1
o'clock position 8 cm from the nipple suspicious for invasive
mammary carcinoma. There is a second small (4 mm) subtle mass
located 1 cm anterior and inferior to this larger mass which may
represent a small satellite nodule. Ultrasound-guided core biopsy is
recommended and will be performed and reported separately.

RECOMMENDATION:
Left breast ultrasound-guided core biopsy.

I have discussed the findings and recommendations with the patient.
Results were also provided in writing at the conclusion of the
visit. If applicable, a reminder letter will be sent to the patient
regarding the next appointment.

BI-RADS CATEGORY  5: Highly suggestive of malignancy.

## 2016-04-26 DIAGNOSIS — F3013 Manic episode, severe, without psychotic symptoms: Secondary | ICD-10-CM | POA: Diagnosis not present

## 2016-05-29 ENCOUNTER — Encounter: Payer: Self-pay | Admitting: Obstetrics and Gynecology

## 2016-05-29 ENCOUNTER — Telehealth: Payer: Self-pay | Admitting: Obstetrics and Gynecology

## 2016-05-29 ENCOUNTER — Ambulatory Visit (INDEPENDENT_AMBULATORY_CARE_PROVIDER_SITE_OTHER): Payer: Commercial Managed Care - HMO | Admitting: Obstetrics and Gynecology

## 2016-05-29 VITALS — BP 154/94 | HR 100 | Temp 97.9°F | Wt 313.0 lb

## 2016-05-29 DIAGNOSIS — M5442 Lumbago with sciatica, left side: Secondary | ICD-10-CM | POA: Diagnosis not present

## 2016-05-29 MED ORDER — GABAPENTIN 300 MG PO CAPS: 300.0000 mg | ORAL_CAPSULE | Freq: Two times a day (BID) | ORAL | 2 refills | Status: DC

## 2016-05-29 MED ORDER — CYCLOBENZAPRINE HCL 10 MG PO TABS: 10.0000 mg | ORAL_TABLET | Freq: Three times a day (TID) | ORAL | 0 refills | Status: DC | PRN

## 2016-05-29 NOTE — Progress Notes (Deleted)
Subjective:     Patient ID: Kimberly [name], female   DOB: 1971-02-05, 45 y.o.   MRN: [id number]  HPI Kimberly [name] is a 45yo female presenting today for back pain. -  Review of Systems     Objective:   Physical Exam     Assessment and Plan:     ***

## 2016-05-29 NOTE — Telephone Encounter (Signed)
Pt was seen this afternoon, 2 Rx were supposed to be called in to [facility]. [name] husband states they are not there yet, wants to know when we will call them in. Please advise. Thanks! ep

## 2016-05-29 NOTE — Patient Instructions (Signed)
Go to [facility] for back x-ray whenever convenient Medications being sent to your pharmacy will include gabapentin and muscle relaxant Apply heat to back Keep active Order placed for physical therapy   Sciatica Sciatica is pain, weakness, numbness, or tingling along your sciatic nerve. The nerve starts in the lower back and runs down the back of each leg. Nerve damage or certain conditions pinch or put pressure on the sciatic nerve. This causes the pain, weakness, and other discomforts of sciatica. HOME CARE   Only take medicine as told by your doctor.  Apply ice to the affected area for 20 minutes. Do this 3-4 times a day for the first 48-72 hours. Then try heat in the same way.  Exercise, stretch, or do your usual activities if these do not make your pain worse.  Go to physical therapy as told by your doctor.  Keep all doctor visits as told.  Do not wear high heels or shoes that are not supportive.  Get a firm mattress if your mattress is too soft to lessen pain and discomfort. GET HELP RIGHT AWAY IF:   You cannot control when you poop (bowel movement) or pee (urinate).  You have more weakness in your lower back, lower belly (pelvis), butt (buttocks), or legs.  You have redness or puffiness (swelling) of your back.  You have a burning feeling when you pee.  You have pain that gets worse when you lie down.  You have pain that wakes you from your sleep.  Your pain is worse than past pain.  Your pain lasts longer than 4 weeks.  You are suddenly losing weight without reason. MAKE SURE YOU:   Understand these instructions.  Will watch this condition.  Will get help right away if you are not doing well or get worse.   This information is not intended to replace advice given to you by your health care provider. Make sure you discuss any questions you have with your health care provider.   Document Released: 07/17/2008 Document Revised: 06/29/2015 Document Reviewed:  02/17/2012 Elsevier Interactive Patient Education [employer].

## 2016-05-29 NOTE — Telephone Encounter (Signed)
Will forward to provider that saw patient today.  Altariq Goodall L, RN  

## 2016-05-29 NOTE — Progress Notes (Signed)
Subjective:   Patient ID: Kimberly [name], female    DOB: 08-04-1971, 45 y.o.   MRN: [id number]  Patient presents for Same Day Appointment  Chief Complaint  Patient presents with  . 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 Had chemo but couldn't tolerate so had radiation; Takes anastrazole now  Symptoms Incontinence of bowel or bladder:  Bladder incontinence normal s/p 5 vaginal births Numbness of leg: in legs and hands  Fever: no Rest or Night pain: yes Weight Loss:  no Rash: no   Review of Systems   See HPI for ROS.   Smoking status - current every day smoker  Past medical history, surgical, family, and social history reviewed and updated in the EMR as appropriate.  Objective:  BP (!) 154/94   Pulse 100   Temp 97.9 F (36.6 C) (Oral)   Wt (!) 313 lb (142 kg)   BMI 46.22 kg/m  Vitals and nursing note reviewed  Physical Exam  Constitutional: She appears distressed.  Musculoskeletal:       Lumbar back: She exhibits decreased range of motion, tenderness and pain.  Back Exam:  Inspection: Unremarkable  ROM limited in all directions; pain exacerbated with extension SLR laying: positive Palpable  tenderness: Yes over sciatic nerve and around L1  Sensory change: Gross sensation intact to all lumbar and sacral dermatomes.  Abnormal gait Strength intact   Assessment & Plan:  1. Left-sided low back pain with left-sided sciatica Patient in distress with back movement. No red flags on exam. DDx includes DJD, MSK strain, herniated disk, spinal stenosis, sciatica, metastatic cancer (with history of breast cancer), r/o cauda equina. Most likely has sciatica with history and physical being most consistent. Also high on differential is herniated disk with positive straight leg raise and radicular symptoms that are worse with extension.  -Rx for Neurontin to control neuropathic pain and flexeril given -order placed for PT -need to obtain thoracolumbar films; will likely need MRI to evaluate further -handout given -return to clinic in 2 weeks -emergency care isntructions given with return precuations  Orders Placed This Encounter  Procedures  . DG THORACOLUMABAR SPINE    Standing Status:   Future    Standing Expiration Date:   07/29/2017    Order Specific Question:   Reason for Exam (SYMPTOM  OR DIAGNOSIS REQUIRED)    Answer:   back pain    Order Specific Question:   Is the patient pregnant?    Answer:   No    Order Specific Question:   Preferred imaging location?    Answer:   Internal  . Ambulatory referral  to Physical Therapy    Referral Priority:   Routine    Referral Type:   Physical Medicine    Referral Reason:   Specialty Services Required    Requested Specialty:   Physical Therapy    Number of Visits Requested:   1     Meds ordered this encounter  Medications  . gabapentin (NEURONTIN) 300 MG capsule    Sig: Take 1 capsule (300 mg total) by mouth 2 (two) times daily.    Dispense:  60 capsule    Refill:  2  . cyclobenzaprine (FLEXERIL) 10 MG tablet    Sig: Take 1 tablet (10 mg total) by mouth 3 (three) times daily as needed for muscle spasms.    Dispense:  90 tablet     Refill:  0     Luiz Blare, DO 05/29/2016, 3:04 PM PGY-3, [place]

## 2016-05-30 NOTE — Telephone Encounter (Signed)
Called patient back at the end of my clinic and let her know the medications were sent to her pharmacy. No further follow-up needed. Thanks

## 2016-07-19 ENCOUNTER — Other Ambulatory Visit: Payer: Self-pay | Admitting: *Deleted

## 2016-07-19 DIAGNOSIS — C50412 Malignant neoplasm of upper-outer quadrant of left female breast: Secondary | ICD-10-CM

## 2016-07-19 MED ORDER — ANASTROZOLE 1 MG PO TABS: 1.0000 mg | ORAL_TABLET | Freq: Every day | ORAL | 6 refills | Status: DC

## 2016-07-27 ENCOUNTER — Ambulatory Visit: Payer: Commercial Managed Care - HMO | Admitting: Obstetrics and Gynecology

## 2016-08-31 ENCOUNTER — Other Ambulatory Visit: Payer: Self-pay | Admitting: *Deleted

## 2016-09-04 ENCOUNTER — Other Ambulatory Visit: Payer: Self-pay | Admitting: *Deleted

## 2016-09-04 MED ORDER — GABAPENTIN 300 MG PO CAPS: 300.0000 mg | ORAL_CAPSULE | Freq: Two times a day (BID) | ORAL | 2 refills | Status: DC

## 2016-09-04 MED ORDER — CYCLOBENZAPRINE HCL 10 MG PO TABS: 10.0000 mg | ORAL_TABLET | Freq: Three times a day (TID) | ORAL | 0 refills | Status: DC | PRN

## 2016-09-26 ENCOUNTER — Other Ambulatory Visit: Payer: Self-pay | Admitting: Obstetrics and Gynecology

## 2016-10-23 ENCOUNTER — Emergency Department (HOSPITAL_COMMUNITY)
Admission: EM | Admit: 2016-10-23 | Discharge: 2016-10-23 | Disposition: A | Discharge status: Home / Self Care | Payer: Medicare PPO | Attending: Emergency Medicine | Admitting: Emergency Medicine

## 2016-10-23 ENCOUNTER — Encounter (HOSPITAL_COMMUNITY): Payer: Self-pay

## 2016-10-23 DIAGNOSIS — Y9389 Activity, other specified: Secondary | ICD-10-CM | POA: Insufficient documentation

## 2016-10-23 DIAGNOSIS — W108XXA Fall (on) (from) other stairs and steps, initial encounter: Secondary | ICD-10-CM | POA: Insufficient documentation

## 2016-10-23 DIAGNOSIS — S51812A Laceration without foreign body of left forearm, initial encounter: Secondary | ICD-10-CM | POA: Diagnosis present

## 2016-10-23 DIAGNOSIS — Y929 Unspecified place or not applicable: Secondary | ICD-10-CM | POA: Diagnosis not present

## 2016-10-23 DIAGNOSIS — E119 Type 2 diabetes mellitus without complications: Secondary | ICD-10-CM | POA: Diagnosis not present

## 2016-10-23 DIAGNOSIS — F17210 Nicotine dependence, cigarettes, uncomplicated: Secondary | ICD-10-CM | POA: Insufficient documentation

## 2016-10-23 DIAGNOSIS — Y999 Unspecified external cause status: Secondary | ICD-10-CM | POA: Diagnosis not present

## 2016-10-23 DIAGNOSIS — W19XXXA Unspecified fall, initial encounter: Secondary | ICD-10-CM

## 2016-10-23 DIAGNOSIS — Z853 Personal history of malignant neoplasm of breast: Secondary | ICD-10-CM | POA: Insufficient documentation

## 2016-10-23 DIAGNOSIS — S41112A Laceration without foreign body of left upper arm, initial encounter: Secondary | ICD-10-CM

## 2016-10-23 MED ORDER — KETOROLAC TROMETHAMINE 30 MG/ML IJ SOLN: 30.0000 mg | Freq: Once | INTRAMUSCULAR | Status: DC

## 2016-10-23 MED ORDER — HYDROCODONE-ACETAMINOPHEN 5-325 MG PO TABS: 1.0000 | ORAL_TABLET | ORAL | 0 refills | Status: DC | PRN

## 2016-10-23 MED ORDER — KETOROLAC TROMETHAMINE 30 MG/ML IJ SOLN
30.0000 mg | Freq: Once | INTRAMUSCULAR | Status: AC
  Administered 2016-10-23: 30 mg via INTRAVENOUS
  Filled 2016-10-23: qty 1

## 2016-10-23 MED ORDER — LIDOCAINE-EPINEPHRINE (PF) 2 %-1:200000 IJ SOLN
10.0000 mL | Freq: Once | INTRAMUSCULAR | Status: AC
  Administered 2016-10-23: 10 mL
  Filled 2016-10-23: qty 20

## 2016-10-23 NOTE — ED Provider Notes (Signed)
I was asked to perform laceration repair on this patient . I was otherwise not involved in the management or MDM of this patient.   Marland Kitchen.Laceration Repair Date/Time: 10/23/2016 12:26 PM Performed by: Lajuana Matte Authorized by: Margarita Mail   Consent:    Consent obtained:  Verbal   Consent given by:  Patient   Risks discussed:  Infection, need for additional repair and pain   Alternatives discussed:  No treatment Anesthesia (see MAR for exact dosages):    Anesthesia method:  Local infiltration   Local anesthetic:  Lidocaine 1% WITH epi Laceration details:    Location:  Shoulder/arm   Shoulder/arm location:  L lower arm Repair type:    Repair type:  Simple Exploration:    Wound exploration: wound explored through full range of motion   Treatment:    Area cleansed with:  Betadine   Amount of cleaning:  Standard   Irrigation solution:  Sterile saline Skin repair:    Repair method:  Sutures   Suture size:  5-0   Suture material:  Prolene   Number of sutures:  12 Approximation:    Vermilion border: well-aligned   Post-procedure details:    Patient tolerance of procedure:  Tolerated with difficulty .Marland KitchenLaceration Repair Date/Time: 10/23/2016 12:26 PM Performed by: Lajuana Matte Authorized by: Margarita Mail   Consent:    Consent obtained:  Verbal   Consent given by:  Patient   Risks discussed:  Infection and pain Anesthesia (see MAR for exact dosages):    Anesthesia method:  Local infiltration   Local anesthetic:  Lidocaine 1% WITH epi Laceration details:    Location:  Shoulder/arm   Shoulder/arm location:  L lower arm   Length (cm):  6   Depth (mm):  10 Repair type:    Repair type:  Intermediate Pre-procedure details:    Preparation:  Patient was prepped and draped in usual sterile fashion Exploration:    Wound exploration: wound explored through full range of motion     Contaminated: no   Treatment:    Area cleansed with:  Betadine   Amount of cleaning:   Standard   Irrigation solution:  Sterile saline Skin repair:    Repair method:  Sutures   Suture size:  5-0   Suture material:  Prolene   Number of sutures:  3 Approximation:    Vermilion border: well-aligned   Post-procedure details:    Patient tolerance of procedure:  Tolerated well, no immediate complications      Margarita Mail, [name] 10/23/16 1646    Isla Pence, MD 10/25/16 [phone]

## 2016-10-23 NOTE — ED Notes (Signed)
ED Provider at bedside. 

## 2016-10-23 NOTE — ED Provider Notes (Signed)
[facility] DEPT Provider Note   CSN: [id number] Arrival date & time: 10/23/16  1039     History   Chief Complaint Chief Complaint  Patient presents with  . Fall  . Extremity Laceration    HPI [name] Obst is a 46 y.o. female.  Pt presents to the ED today via EMS after tripping and falling down steps.  She sustained lacerations to her left forearm.  She denies any other injury.  Fentanyl 150 mcg given by EMS.  She said that she did not hit her head or have a loc.  The pt was ambulatory after the fall.      Past Medical History:  Diagnosis Date  . Anemia   . Arthritis   . Bipolar 1 disorder ([place])   . Blood transfusion without reported diagnosis 2007   post bleeding childbirth  . Cancer ([place])    eval  lt breast cancer  . Diabetes mellitus without complication ([place])    gestational  . Heart murmur    as a child-not adult-no cardiac work up  . History of radiation therapy 08/31/14-10/21/14   left breast/ left supraclavicular 50.4 Gy 28 fx, lef tposterior axillary boost 9.52 Gy 28 fx, left rbeast boost/ 10 Gy 5 fx    Patient Active Problem List   Diagnosis Date Noted  . Mixed incontinence 09/29/2015  . Genetic testing 08/26/2015  . Breast cancer of upper-outer quadrant of left female breast ([place]) 05/07/2014  . Disorder of bladder 09/29/2008  . LOW BACK PAIN, CHRONIC 09/29/2008  . OBESITY, MORBID 11/19/2007  . ANEMIA, IRON DEFICIENCY, CHRONIC 11/14/2007  . Mixed bipolar I disorder ([place]) 02/20/2007  . TOBACCO ABUSE 02/20/2007    Past Surgical History:  Procedure Laterality Date  . BREAST BIOPSY Left 04/01/14  . BREAST BIOPSY Right 04/19/14  . BREAST LUMPECTOMY WITH AXILLARY LYMPH NODE DISSECTION Bilateral 05/03/14  . DILATION AND CURETTAGE OF UTERUS     after childbirth  . MULTIPLE TOOTH EXTRACTIONS     only 5 left  . PORTACATH PLACEMENT Right 06/18/2014   Procedure: INSERTION PORT-A-CATH;  Surgeon: [name], MD;  Location: [place];  Service: General;  Laterality: Right;  . TONSILLECTOMY AND ADENOIDECTOMY      OB History    No data available       Home Medications    Prior to Admission medications   Medication Sig Start Date End Date Taking? Authorizing Provider  albuterol (PROVENTIL HFA;VENTOLIN HFA) 108 (90 BASE) MCG/ACT inhaler Inhale 2 puffs into the lungs every 6 (six) hours as needed for wheezing or shortness of breath. Patient not taking: Reported on 10/21/2015 07/16/14   Minette Headland, NP  anastrozole (ARIMIDEX) 1 MG tablet Take 1 tablet (1 mg total) by mouth daily. 07/19/16   [name], MD  azithromycin (ZITHROMAX Z-PAK) 250 MG tablet 2 pills day 1 then 1 pill daily for days 2-5 12/26/15   Konrad Felix, PA  benzonatate (TESSALON) 100 MG capsule Take 1 capsule (100 mg total) by mouth 2 (two) times daily as needed for cough. 12/21/15   Mariel Aloe, MD  chlorpheniramine-HYDROcodone [facility] PENNKINETIC ER) 10-8 MG/5ML SUER Take 5 mLs by mouth every 12 (twelve) hours as needed for cough. 12/26/15   Konrad Felix, PA  citalopram (CELEXA) 40 MG tablet Take 40 mg by mouth daily.    Historical Provider, MD  clonazePAM (KLONOPIN) 1 MG tablet Take 1 mg by mouth 3 (three) times daily.  03/21/14  Historical Provider, MD  cyclobenzaprine (FLEXERIL) 10 MG tablet Take 1 tablet (10 mg total) by mouth 3 (three) times daily as needed for muscle spasms. 09/04/16   [name], DO  ferrous sulfate 325 (65 FE) MG tablet Take 1 tablet (325 mg total) by mouth daily with breakfast. 09/29/15   Donnamae Jude, MD  gabapentin (NEURONTIN) 300 MG capsule Take 1 capsule (300 mg total) by mouth 2 (two) times daily. 09/04/16   [name], DO  HYDROcodone-acetaminophen (NORCO/VICODIN) 5-325 MG tablet Take 1 tablet by mouth every 4 (four) hours as needed. 10/23/16   Isla Pence, MD  ipratropium (ATROVENT) 0.06 % nasal spray Place 2 sprays into both nostrils 4 (four) times daily. Patient not taking: Reported on  10/21/2015 11/30/14   Melony Overly, MD  lamoTRIgine (LAMICTAL) 200 MG tablet Take 1 tablet (200 mg total) by mouth 2 (two) times daily. 07/25/15   Ashly Windell Moulding, DO  naproxen (NAPROSYN) 500 MG tablet Take 1 tablet (500 mg total) by mouth 2 (two) times daily with a meal. 10/21/15   Laurie Panda, NP  omeprazole (PRILOSEC) 40 MG capsule Take 1 capsule (40 mg total) by mouth daily. 10/21/15   Laurie Panda, NP  prazosin (MINIPRESS) 2 MG capsule  07/01/14   Historical Provider, MD  predniSONE (DELTASONE) 20 MG tablet Take 3 tablets (60 mg total) by mouth daily with breakfast. 12/21/15   Mariel Aloe, MD  QUEtiapine (SEROQUEL) 200 MG tablet Take 1 tablet (200 mg total) by mouth 2 (two) times daily. 09/15/14   [name], DO  risperiDONE (RISPERDAL) 3 MG tablet Take 3 mg by mouth daily. 08/11/14   Historical Provider, MD  varenicline (CHANTIX) 0.5 MG tablet Take 1 tablet (0.5 mg total) by mouth 2 (two) times daily. Patient not taking: Reported on 10/21/2015 09/29/15   Donnamae Jude, MD    Family History Family History  Problem Relation Age of Onset  . Heart disease Father   . Cancer Father     Prostate    Social History Social History  Substance Use Topics  . Smoking status: Current Every Day Smoker    Packs/day: 1.00    Years: 32.00    Types: Cigarettes  . Smokeless tobacco: Never Used     Comment: 11/26/14 smoking 1 pack every 2 days  . Alcohol use No     Allergies   Patient has no known allergies.   Review of Systems Review of Systems  Skin: Positive for wound.  All other systems reviewed and are negative.    Physical Exam Updated Vital Signs BP (!) 136/106 (BP Location: Right Arm)   Pulse 110   Resp 18   Ht 5\' 8"  (1.727 m)   Wt 296 lb (134.3 kg)   SpO2 94%   BMI 45.01 kg/m   Physical Exam  Constitutional: She is oriented to person, place, and time. She appears well-developed and well-nourished.  HENT:  Head: Normocephalic and atraumatic.  Right Ear:  External ear normal.  Left Ear: External ear normal.  Nose: Nose normal.  Mouth/Throat: Oropharynx is clear and moist.  Eyes: Conjunctivae and EOM are normal. Pupils are equal, round, and reactive to light.  Neck: Normal range of motion. Neck supple.  Cardiovascular: Regular rhythm, normal heart sounds and intact distal pulses.  Tachycardia present.   Pulmonary/Chest: Effort normal and breath sounds normal.  Abdominal: Soft. Bowel sounds are normal.  Musculoskeletal: Normal range of motion.  Legs: Neurological: She is alert and oriented to person, place, and time.  Skin: Skin is warm and dry.     Psychiatric: She has a normal mood and affect. Her behavior is normal. Judgment and thought content normal.  Nursing note and vitals reviewed.    ED Treatments / Results  Labs (all labs ordered are listed, but only abnormal results are displayed) Labs Reviewed - No data to display  EKG  EKG Interpretation None       Radiology No results found.  Procedures Procedures (including critical care time)  Medications Ordered in ED Medications  lidocaine-EPINEPHrine (XYLOCAINE W/EPI) 2 %-1:200000 (PF) injection 10 mL (10 mLs Infiltration Given by Other 10/23/16 1122)  ketorolac (TORADOL) 30 MG/ML injection 30 mg (30 mg Intravenous Given 10/23/16 1122)     Initial Impression / Assessment and Plan / ED Course  I have reviewed the triage vital signs and the nursing notes.  Pertinent labs & imaging results that were available during my care of the patient were reviewed by me and considered in my medical decision making (see chart for details).  Clinical Course     Arm laceration repair per PA Harris.  Pt tolerated well.  She will be ambulated and d/c with lortab.    Final Clinical Impressions(s) / ED Diagnoses   Final diagnoses:  Fall, initial encounter  Arm laceration, left, initial encounter    New Prescriptions New Prescriptions   HYDROCODONE-ACETAMINOPHEN  (NORCO/VICODIN) 5-325 MG TABLET    Take 1 tablet by mouth every 4 (four) hours as needed.     Isla Pence, MD 10/23/16 [phone]

## 2016-10-23 NOTE — ED Triage Notes (Signed)
PER EMS: pt tripped and fall into a glass type screen door and then fell down 3 steps. Has laceration to left forearm, bleeding controlled at this time with gauze. Denies LOC, no head injury. EMS adm 171mcg of Fentanyl IV. A&OX4.

## 2016-11-05 ENCOUNTER — Encounter (HOSPITAL_COMMUNITY): Payer: Self-pay | Admitting: Emergency Medicine

## 2016-11-05 ENCOUNTER — Ambulatory Visit (HOSPITAL_COMMUNITY)
Admission: EM | Admit: 2016-11-05 | Discharge: 2016-11-05 | Disposition: A | Discharge status: Home / Self Care | Payer: Medicare PPO | Attending: Emergency Medicine | Admitting: Emergency Medicine

## 2016-11-05 DIAGNOSIS — Z4802 Encounter for removal of sutures: Secondary | ICD-10-CM

## 2016-11-05 NOTE — ED Triage Notes (Signed)
Patient seen 1/2 at [facility] long for sutures.  Here today for removal of sutures.  2 sites, left forearm and left elbow

## 2016-11-05 NOTE — Discharge Instructions (Signed)
Keep wounds clean with cool soapy water. Watch for any signs of infection such as redness, swelling, pus or other drainage. Return if needed.

## 2016-11-05 NOTE — ED Provider Notes (Signed)
CSN: [id number]     Arrival date & time 11/05/16  1041 History   First MD Initiated Contact with Patient 11/05/16 1137     Chief Complaint  Patient presents with  . Suture / Staple Removal   (Consider location/radiation/quality/duration/timing/severity/associated sxs/prior Treatment) 46 year old female had a laceration to the left volar forearm as well as the elbow repaired and an emergency room at [facility] on January 2 give her tachycardia day. She states she was unable to return at the time a pointed due to her husband being sick. She has 3 sutures in the left elbow and several more and the left forearm. There are approximately 7-8 days over due and are noticed to be rather deep with overgrown dermis.      Past Medical History:  Diagnosis Date  . Anemia   . Arthritis   . Bipolar 1 disorder ([place])   . Blood transfusion without reported diagnosis 2007   post bleeding childbirth  . Cancer ([place])    eval  lt breast cancer  . Diabetes mellitus without complication ([place])    gestational  . Heart murmur    as a child-not adult-no cardiac work up  . History of radiation therapy 08/31/14-10/21/14   left breast/ left supraclavicular 50.4 Gy 28 fx, lef tposterior axillary boost 9.52 Gy 28 fx, left rbeast boost/ 10 Gy 5 fx   Past Surgical History:  Procedure Laterality Date  . BREAST BIOPSY Left 04/01/14  . BREAST BIOPSY Right 04/19/14  . BREAST LUMPECTOMY WITH AXILLARY LYMPH NODE DISSECTION Bilateral 05/03/14  . DILATION AND CURETTAGE OF UTERUS     after childbirth  . MULTIPLE TOOTH EXTRACTIONS     only 5 left  . PORTACATH PLACEMENT Right 06/18/2014   Procedure: INSERTION PORT-A-CATH;  Surgeon: [name], MD;  Location: [place];  Service: General;  Laterality: Right;  . TONSILLECTOMY AND ADENOIDECTOMY     Family History  Problem Relation Age of Onset  . Heart disease Father   . Cancer Father     Prostate   Social History  Substance Use Topics   . Smoking status: Current Every Day Smoker    Packs/day: 1.00    Years: 32.00    Types: Cigarettes  . Smokeless tobacco: Never Used     Comment: 11/26/14 smoking 1 pack every 2 days  . Alcohol use No   OB History    No data available     Review of Systems  HENT: Negative.   Respiratory: Negative.   Skin: Positive for wound.       As per history of present illness    Allergies  Patient has no known allergies.  Home Medications   Prior to Admission medications   Medication Sig Start Date End Date Taking? Authorizing Provider  anastrozole (ARIMIDEX) 1 MG tablet Take 1 tablet (1 mg total) by mouth daily. 07/19/16   [name], MD  citalopram (CELEXA) 40 MG tablet Take 40 mg by mouth daily.    Historical Provider, MD  clonazePAM (KLONOPIN) 1 MG tablet Take 1 mg by mouth 3 (three) times daily.  03/21/14   Historical Provider, MD  cyclobenzaprine (FLEXERIL) 10 MG tablet Take 1 tablet (10 mg total) by mouth 3 (three) times daily as needed for muscle spasms. 09/04/16   [name], DO  ferrous sulfate 325 (65 FE) MG tablet Take 1 tablet (325 mg total) by mouth daily with breakfast. 09/29/15   Donnamae Jude, MD  gabapentin (NEURONTIN)  300 MG capsule Take 1 capsule (300 mg total) by mouth 2 (two) times daily. 09/04/16   [name], DO  HYDROcodone-acetaminophen (NORCO/VICODIN) 5-325 MG tablet Take 1 tablet by mouth every 4 (four) hours as needed. 10/23/16   Isla Pence, MD  lamoTRIgine (LAMICTAL) 200 MG tablet Take 1 tablet (200 mg total) by mouth 2 (two) times daily. 07/25/15   Ashly Windell Moulding, DO  naproxen (NAPROSYN) 500 MG tablet Take 1 tablet (500 mg total) by mouth 2 (two) times daily with a meal. 10/21/15   Laurie Panda, NP  omeprazole (PRILOSEC) 40 MG capsule Take 1 capsule (40 mg total) by mouth daily. 10/21/15   Laurie Panda, NP  prazosin (MINIPRESS) 2 MG capsule  07/01/14   Historical Provider, MD  QUEtiapine (SEROQUEL) 200 MG tablet Take 1 tablet (200 mg  total) by mouth 2 (two) times daily. 09/15/14   [name], DO  risperiDONE (RISPERDAL) 3 MG tablet Take 3 mg by mouth daily. 08/11/14   Historical Provider, MD   Meds Ordered and Administered this Visit  Medications - No data to display  BP 132/95 (BP Location: Left Arm) Comment (BP Location): large cuff  Pulse 107   Temp 98.5 F (36.9 C) (Oral)   Resp 22   SpO2 96%  No data found.   Physical Exam  Constitutional: She is oriented to person, place, and time. She appears well-developed and well-nourished. No distress.  Neurological: She is alert and oriented to person, place, and time.  Skin: Skin is warm and dry.  The 2 wounds of the left forearm are intact. The sutures were removed. They were deep and a scalpel was used for most of the thumb. There was some bleeding. No sutures to be visualized as remaining. There is no evidence of infection. No erythema no purulence or other draining. Once they were removed there had been superficial skin injury due to the scalpel. The wound was cleaned with Betadine and alcohol and dressed.  Nursing note and vitals reviewed.   Urgent Care Course   Clinical Course     Procedures (including critical care time)  Labs Review Labs Reviewed - No data to display  Imaging Review No results found.   Visual Acuity Review  Right Eye Distance:   Left Eye Distance:   Bilateral Distance:    Right Eye Near:   Left Eye Near:    Bilateral Near:         MDM   1. Visit for suture removal    Keep wounds clean with cool soapy water. Watch for any signs of infection such as redness, swelling, pus or other drainage. Return if needed.     Janne Napoleon, NP 11/05/16 [phone]

## 2016-11-16 ENCOUNTER — Ambulatory Visit (INDEPENDENT_AMBULATORY_CARE_PROVIDER_SITE_OTHER): Payer: Medicare PPO | Admitting: Obstetrics and Gynecology

## 2016-11-16 ENCOUNTER — Ambulatory Visit: Payer: Commercial Managed Care - HMO | Admitting: Obstetrics and Gynecology

## 2016-11-16 ENCOUNTER — Encounter: Payer: Self-pay | Admitting: Psychology

## 2016-11-16 ENCOUNTER — Encounter: Payer: Self-pay | Admitting: Obstetrics and Gynecology

## 2016-11-16 VITALS — BP 124/80 | HR 113 | Temp 97.7°F | Ht 69.0 in | Wt 308.0 lb

## 2016-11-16 DIAGNOSIS — F4320 Adjustment disorder, unspecified: Secondary | ICD-10-CM

## 2016-11-16 DIAGNOSIS — F3160 Bipolar disorder, current episode mixed, unspecified: Secondary | ICD-10-CM

## 2016-11-16 DIAGNOSIS — F4321 Adjustment disorder with depressed mood: Secondary | ICD-10-CM

## 2016-11-16 MED ORDER — QUETIAPINE FUMARATE 300 MG PO TABS: 300.0000 mg | ORAL_TABLET | Freq: Every day | ORAL | 0 refills | Status: DC

## 2016-11-16 MED ORDER — RISPERIDONE 3 MG PO TABS: 3.0000 mg | ORAL_TABLET | Freq: Every day | ORAL | 0 refills | Status: DC

## 2016-11-16 MED FILL — clonazePAM 1 MG TABS: 1 | 30 days supply | Qty: 90 | Fill #0

## 2016-11-16 MED FILL — QUETIAPINE 300 MG TABLET: 300 | 30 days supply | Qty: 30 | Fill #0

## 2016-11-16 MED FILL — risperiDONE 3 MG TABS: 3 | 30 days supply | Qty: 30 | Fill #0

## 2016-11-16 NOTE — Progress Notes (Signed)
     Subjective: Chief Complaint  Patient presents with  . grief     HPI: Kimberly [name] is a 46 y.o. presenting to clinic today to discuss the following:  #Grief Patient presents to office to discuss recent loss of husband, patient states her husband passed on 2023/02/12. She has been having a hard time coping with loss as he was central to her family. The children are also having a hard time dealing with the loss. Patient states that her finances have been frozen since they were all in her husband's name. She is unable to take care of her own finances due to her extensive mental health history. She is requesting assistance with food and medications until her sister (who accompanied patient to appointment) can resume patient's finances.   #Mental Health Patient with diagnosis of mixed bipolar 1 disorder. She follows with [facility]. Last seen on Jan 3. She is unable to get refills of medications due to not having money to pay for them. Compliant with medications. Denies SI/HI.   Patient denies any other concerns today. States she just feels like she is going to be unable to move on.     ROS noted in HPI.  Past Medical, Surgical, Social, and Family History Reviewed & Updated per EMR. History  Smoking Status  . Current Every Day Smoker  . Packs/day: 1.00  . Years: 32.00  . Types: Cigarettes  Smokeless Tobacco  . Never Used    Comment: 11/26/14 smoking 1 pack every 2 days    Objective: BP 124/80   Pulse (!) 113   Temp 97.7 F (36.5 C) (Oral)   Ht 5\' 9"  (1.753 m)   Wt (!) 308 lb (139.7 kg)   BMI 45.48 kg/m  Vitals and nursing notes reviewed  Physical Exam  Constitutional: She is well-developed, well-nourished, and in no distress.  Cardiovascular: Regular rhythm.  Tachycardia present.   Pulmonary/Chest: Effort normal and breath sounds normal.  Psychiatric: She exhibits a depressed mood. She expresses no homicidal and no suicidal ideation. She has a flat affect.  Tearful  and sad-appearing.     Depression screen PHQ 2/9 11/16/2016  Decreased Interest 3  Down, Depressed, Hopeless 3  PHQ - 2 Score 6  Altered sleeping 3  Tired, decreased energy 3  Change in appetite 3  Feeling bad or failure about yourself  1  Trouble concentrating 3  Moving slowly or fidgety/restless 1  Suicidal thoughts 0  PHQ-9 Score 20  Difficult doing work/chores Extremely dIfficult  Some recent data might be hidden    Assessment/Plan: Please see problem based Assessment and Plan PATIENT EDUCATION PROVIDED: See AVS   Greater than 50% of time was spent in counseling and coordination of care with patient. Total of >25 minutes.  Luiz Blare, DO 11/16/2016, 2:17 PM PGY-3, [place]

## 2016-11-16 NOTE — Patient Instructions (Addendum)
Sorry for your loss.  Please let us know how we can be of help to you Food box given today with other reasources  [facility]  Follow-up with me in 2 weeks   Scheduled appointment at [facility] for Feb. 14th @11am 
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
## 2016-11-21 ENCOUNTER — Ambulatory Visit (INDEPENDENT_AMBULATORY_CARE_PROVIDER_SITE_OTHER): Payer: Medicare PPO | Admitting: Family Medicine

## 2016-11-21 ENCOUNTER — Encounter: Payer: Self-pay | Admitting: Family Medicine

## 2016-11-21 VITALS — BP 110/86 | HR 101 | Temp 97.9°F | Ht 69.0 in | Wt 308.4 lb

## 2016-11-21 DIAGNOSIS — R059 Cough, unspecified: Secondary | ICD-10-CM

## 2016-11-21 DIAGNOSIS — R05 Cough: Secondary | ICD-10-CM | POA: Diagnosis not present

## 2016-11-21 DIAGNOSIS — F4321 Adjustment disorder with depressed mood: Secondary | ICD-10-CM | POA: Insufficient documentation

## 2016-11-21 MED ORDER — DOXYCYCLINE HYCLATE 100 MG PO TABS: 100.0000 mg | ORAL_TABLET | Freq: Two times a day (BID) | ORAL | 0 refills | Status: DC

## 2016-11-21 MED ORDER — PREDNISONE 50 MG PO TABS: 50.0000 mg | ORAL_TABLET | Freq: Every day | ORAL | 0 refills | Status: DC

## 2016-11-21 MED ORDER — GUAIFENESIN-DM 100-10 MG/5ML PO SYRP: 5.0000 mL | ORAL_SOLUTION | ORAL | 0 refills | Status: DC | PRN

## 2016-11-21 MED FILL — predniSONE 50 MG TABS: 50 | 5 days supply | Qty: 5 | Fill #0

## 2016-11-21 MED FILL — DOXYCYCLINE HYCLATE 100 MG: 100 | 5 days supply | Qty: 10 | Fill #0

## 2016-11-21 NOTE — Progress Notes (Signed)
   Subjective:  Kimberly [name] is a 46 y.o. female who presents to the [facility] today with a chief complaint of cough.   HPI:  Cough Symptoms started 3 days ago. Had subjective fevers intermittently over that time. Associated symptoms including runny nose, headache, and wheezing.  No sore throat. Cough up green-yellow sputum. Some shortness of breath. No chest pain. Been in the hospital over the last 13 days with her husband who recently passed away. No muscle aches.   Patient is a current smoker. Has smoked as much as 2 packs per day in the past.   ROS: Per HPI  PMH: Smoking history reviewed.   Objective:  Physical Exam: BP 110/86 (BP Location: Right Arm, Patient Position: Sitting, Cuff Size: Large)   Pulse (!) 101   Temp 97.9 F (36.6 C) (Oral)   Ht 5\' 9"  (1.753 m)   Wt (!) 308 lb 6.4 oz (139.9 kg)   SpO2 94%   BMI 45.54 kg/m   Gen: Mildly increased work of breathing. CV: RRR with no murmurs appreciated Pulm: NWOB. Diffuse rhonchi and wheezes noted.  MSK: no edema, cyanosis, or clubbing noted Skin: warm, dry Neuro: grossly normal, moves all extremities Psych: Normal affect and thought content  Assessment/Plan:  Cough Given smoking history and wheeze, will treat as COPD exacerbation despite having no formal diagnosis. Stable on room air. Will treat with 5 day course of prednisone and doxycycline. Consider PFTs once over acute illness to establish pulmonary diagnosis. Return precautions reviewed. Follow up as needed.    Algis Greenhouse. Jerline Pain, [place] Resident PGY-3 11/21/2016 4:52 PM

## 2016-11-21 NOTE — Patient Instructions (Addendum)
We will send in the antibiotics and prednisone.  Come back to see Dr Gerarda Fraction when you are feeling better to discuss your lung function tests.  Take care,  Dr Jerline Pain

## 2016-11-21 NOTE — Assessment & Plan Note (Signed)
Mood has always has been stable since I have been seeing her. At risk for dysfunctional mood in setting of actively grieving. Made patient appointment for patient to follow-up at [facility] on Feb. 14th. Gave patient a one month fill on her medications [facility] so she did not have to worry about paying for them while her finances are being transitioned over. PHQ-9 today high; this may be misleading in setting of grief. Will need to reassess at next visit. Patient to follow-up in 2-3 weeks.

## 2016-11-21 NOTE — Assessment & Plan Note (Signed)
Currently grieving loss of husband. Extensive time at this visit on counseling and normal grief reactions. Patient is at high risk for complicated or prolonged grieving reaction. Will need to follow closely. Patient met with behavioral health team today (please see their separate note). Patient also given resources from Education officer, museum on food pantries in community. Patient received food items from clinic today. Encouraged patient to reach out to a grief counselor and get the children involved in [facility].

## 2016-11-23 ENCOUNTER — Telehealth: Payer: Self-pay | Admitting: Psychology

## 2016-11-23 NOTE — Telephone Encounter (Signed)
[facility] Caroleen Hamman) called patient to check in. Patient was able to pick up psychiatric medications and has continued taking medication. [facility] checked in whether she is continuing counseling and patient noted that she doesn't like her counselor and has not been to see her counselor in 6 months. She and her counselor have been working together for 5 years. She noted that she has communicated her frustrations with her counselor who expressed understanding but did not change. The specific issue she noted is that the counselor listens but doesn't give feedback. This appears inconsistent with the concern that her sister noted at the last appointment (the counselor doesn't properly listen). During this call, the patient noted that she would like to come to see [facility]. [facility] noted that we offer brief interventions rather than longer term therapy but patient was insistent. [facility] offered to check in with supervisor and if unable to see patient, could offer list of referrals. Patient voiced understanding.

## 2016-11-30 ENCOUNTER — Encounter: Payer: Self-pay | Admitting: Family Medicine

## 2016-11-30 ENCOUNTER — Ambulatory Visit (INDEPENDENT_AMBULATORY_CARE_PROVIDER_SITE_OTHER): Payer: Medicare PPO | Admitting: Family Medicine

## 2016-11-30 VITALS — BP 142/80 | HR 120 | Temp 98.0°F | Wt 305.0 lb

## 2016-11-30 DIAGNOSIS — K649 Unspecified hemorrhoids: Secondary | ICD-10-CM

## 2016-11-30 MED ORDER — HYDROCORTISONE ACETATE 25 MG RE SUPP: 25.0000 mg | Freq: Two times a day (BID) | RECTAL | 0 refills | Status: DC

## 2016-11-30 MED FILL — ANUCORT-HC 25 MG SUPP: 25 | 6 days supply | Qty: 12 | Fill #0

## 2016-11-30 NOTE — Patient Instructions (Signed)
Use the suppository twice daily.  Start taking metamucil or another fiber supplement.  Try sitting in warm water for a couple times a day.  If it is not getting better in 5 days or if it worsens, let us know.   Take care,  Dr Jerline Pain

## 2016-11-30 NOTE — Progress Notes (Signed)
    Subjective:  Kimberly [name] is a 46 y.o. female who presents to the [facility] today with a chief complaint of hemorrhoids.   HPI:  Hemorrhoids Symptoms started about 3-4 days ago with pain in her anal region. Feels like past episodes of hemorrhoids. Some bloody drainage. No purulent drainage. Having a liquid BM every day. No constipation. She has tried several OTC products including witch hazel, preparation H and ice.   ROS: Per HPI  PMH: Smoking history reviewed.   Objective:  Physical Exam: BP (!) 142/80   Pulse (!) 120   Temp 98 F (36.7 C) (Oral)   Wt (!) 305 lb (138.3 kg)   SpO2 97%   BMI 45.04 kg/m   Gen: NAD, resting comfortably GI: Normal bowel sounds present. Soft, Nontender, Nondistended. GU: large 3cm soft external hemorrhoid and 3 o clock position very tender to palpation MSK: no edema, cyanosis, or clubbing noted Skin: warm, dry Neuro: grossly normal, moves all extremities Psych: Normal affect and thought content  Assessment/Plan:  Hemorrhoid No signs of significant thrombosis or other complication. Will proceed with conservative therapy. Instructed patient to start fiber supplement. Also instructed her to sit in warm water for 15 minutes at a time 2-3 times a day for the next week. Will treat symptomatically with hydrocortisone suppositories. Return precautions reviewed. Follow up as needed.   Algis Greenhouse. Jerline Pain, [place] Resident PGY-3 11/30/2016 11:35 AM

## 2016-12-03 ENCOUNTER — Ambulatory Visit (INDEPENDENT_AMBULATORY_CARE_PROVIDER_SITE_OTHER): Payer: Medicare PPO | Admitting: Family Medicine

## 2016-12-03 ENCOUNTER — Encounter: Payer: Self-pay | Admitting: Family Medicine

## 2016-12-03 DIAGNOSIS — K645 Perianal venous thrombosis: Secondary | ICD-10-CM | POA: Insufficient documentation

## 2016-12-03 MED ORDER — LIDOCAINE-HYDROCORTISONE ACE 2-2 % RE KIT: 1.0000 "application " | PACK | Freq: Two times a day (BID) | RECTAL | 0 refills | Status: DC | PRN

## 2016-12-03 MED ORDER — POLYETHYLENE GLYCOL 3350 17 G PO PACK: 17.0000 g | PACK | Freq: Every day | ORAL | 0 refills | Status: DC

## 2016-12-03 MED ORDER — DOCUSATE SODIUM 100 MG PO CAPS: 100.0000 mg | ORAL_CAPSULE | Freq: Two times a day (BID) | ORAL | 0 refills | Status: DC

## 2016-12-03 MED FILL — LIDOCAINE 4% CREAM: 4 | 7 days supply | Qty: 30 | Fill #0

## 2016-12-03 NOTE — Progress Notes (Signed)
   HPI  CC: Worsening hemorrhoidal pain Patient is here with worsening hemorrhoidal pain. She was seen this past Friday by Dr. Jerline Pain for this same issue. She states that since that time her pain has gotten significantly worse. She states that she has tried "everything over-the-counter". She was provided suppositories at the last visit and these have not helped either. Patient has been trying to soak in her bath with Epsom salts, which is not made anything better. She endorses some rectal bleeding which is consistent with her hemorrhoids. She denies any significant fever, chills. She states that her last bowel movement was approximately 3 days ago.  Of note: Patient's has been passed away late last month, 11/21/16. Patient is very distraught over this and tearful at times.  Review of Systems    See HPI for ROS. All other systems reviewed and are negative.  CC, SH/smoking status, and VS noted  Objective: BP 128/78   Pulse (!) 117   Temp 98.2 F (36.8 C) (Oral)   Ht _0  (1.753 m)   Wt (!) 304 lb 9.6 oz (138.2 kg)   SpO2 97%   BMI 44.98 kg/m  Gen: NAD, alert, cooperative, and pleasant. CV: RRR, no murmur Resp: CTAB, no wheezes, non-labored Abd: SNTND, BS present, no guarding or organomegaly Neuro: Alert and oriented, Speech clear, No gross deficits Rectal exam: tenderness noted at the site of hemorrhoids, thrombosed external hemorrhoids noted ~5 o'clock.  Procedure: INCISION AND DRAINAGE: Thrombosed external hemorrhoid  20 minutes prior to procedure time topical lidocaine cream was applied to the hemorrhoid and surrounding tissues. A timeout protocol was performed prior to initiating the procedure. The area was prepared with Betadine and alcohol swabs and draped in the usual, sterile manner. The site was anesthetized with 61m of 1% lidocaine with epinephrine. A 1.5 cm linear incision along the external hemorrhoid was made and the area was manipulated. Blood was expressed as well as a  small 0.5x0.5cm clot. Bleeding was minimal, EBL ~5cc. Dressing was placed.   The patient tolerated the procedure well without complications.  Standard post-procedure care is explained and return precautions are given.   Assessment and plan:  External hemorrhoid, thrombosed Patient presenting with acute worsening of her external hemorrhoids. Signs and symptoms consistent with thrombosed external hemorrhoid. Procedure performed to incise and drain the thrombosis. Patient tolerated procedure well. - Colace twice a day for 2 weeks - MiraLAX once a day for 2 weeks - Strongly encouraged continued Epsom salt baths. - Lidocaine jelly prescribed; to be used as needed. - PCP may want to consider discontinuing or reducing frequency of iron supplementation.  [Dr. CErin Hearingand Deseree present for procedure.]   Meds ordered this encounter  Medications  . docusate sodium (COLACE) 100 MG capsule    Sig: Take 1 capsule (100 mg total) by mouth 2 (two) times daily.    Dispense:  28 capsule    Refill:  0  . polyethylene glycol (MIRALAX) packet    Sig: Take 17 g by mouth daily.    Dispense:  14 each    Refill:  0  . Lidocaine-Hydrocortisone Ace 2-2 % KIT    Sig: Place 1 application rectally 2 (two) times daily as needed. For hemorrhoidal pain.    Dispense:  1 each    Refill:  0     IElberta Leatherwood MD,MS,  PGY3 12/03/2016 2:52 PM

## 2016-12-03 NOTE — Assessment & Plan Note (Addendum)
Patient presenting with acute worsening of her external hemorrhoids. Signs and symptoms consistent with thrombosed external hemorrhoid. Procedure performed to incise and drain the thrombosis. Patient tolerated procedure well. - Colace twice a day for 2 weeks - MiraLAX once a day for 2 weeks - Strongly encouraged continued Epsom salt baths. - Lidocaine jelly prescribed; to be used as needed. - PCP may want to consider discontinuing or reducing frequency of iron supplementation.  [Dr. Erin Hearing and Deseree present for procedure.]

## 2016-12-03 NOTE — Patient Instructions (Signed)
Hemorrhoids Hemorrhoids are swollen veins in and around the rectum or anus. There are two types of hemorrhoids:  Internal hemorrhoids. These occur in the veins that are just inside the rectum. They may poke through to the outside and become irritated and painful.  External hemorrhoids. These occur in the veins that are outside of the anus and can be felt as a painful swelling or hard lump near the anus.  Most hemorrhoids do not cause serious problems, and they can be managed with home treatments such as diet and lifestyle changes. If home treatments do not help your symptoms, procedures can be done to shrink or remove the hemorrhoids. What are the causes? This condition is caused by increased pressure in the anal area. This pressure may result from various things, including:  Constipation.  Straining to have a bowel movement.  Diarrhea.  Pregnancy.  Obesity.  Sitting for long periods of time.  Heavy lifting or other activity that causes you to strain.  Anal sex.  What are the signs or symptoms? Symptoms of this condition include:  Pain.  Anal itching or irritation.  Rectal bleeding.  Leakage of stool (feces).  Anal swelling.  One or more lumps around the anus.  How is this diagnosed? This condition can often be diagnosed through a visual exam. Other exams or tests may also be done, such as:  Examination of the rectal area with a gloved hand (digital rectal exam).  Examination of the anal canal using a small tube (anoscope).  A blood test, if you have lost a significant amount of blood.  A test to look inside the colon (sigmoidoscopy or colonoscopy).  How is this treated? This condition can usually be treated at home. However, various procedures may be done if dietary changes, lifestyle changes, and other home treatments do not help your symptoms. These procedures can help make the hemorrhoids smaller or remove them completely. Some of these procedures involve  surgery, and others do not. Common procedures include:  Rubber band ligation. Rubber bands are placed at the base of the hemorrhoids to cut off the blood supply to them.  Sclerotherapy. Medicine is injected into the hemorrhoids to shrink them.  Infrared coagulation. A type of light energy is used to get rid of the hemorrhoids.  Hemorrhoidectomy surgery. The hemorrhoids are surgically removed, and the veins that supply them are tied off.  Stapled hemorrhoidopexy surgery. A circular stapling device is used to remove the hemorrhoids and use staples to cut off the blood supply to them.  Follow these instructions at home: Eating and drinking  Eat foods that have a lot of fiber in them, such as whole grains, beans, nuts, fruits, and vegetables. Ask your health care provider about taking products that have added fiber (fiber supplements).  Drink enough fluid to keep your urine clear or pale yellow. Managing pain and swelling  Take warm sitz baths for 20 minutes, 3-4 times a day to ease pain and discomfort.  If directed, apply ice to the affected area. Using ice packs between sitz baths may be helpful. ? Put ice in a plastic bag. ? Place a towel between your skin and the bag. ? Leave the ice on for 20 minutes, 2-3 times a day. General instructions  Take over-the-counter and prescription medicines only as told by your health care provider.  Use medicated creams or suppositories as told.  Exercise regularly.  Go to the bathroom when you have the urge to have a bowel movement. Do not wait.    Avoid straining to have bowel movements.  Keep the anal area dry and clean. Use wet toilet paper or moist towelettes after a bowel movement.  Do not sit on the toilet for long periods of time. This increases blood pooling and pain. Contact a health care provider if:  You have increasing pain and swelling that are not controlled by treatment or medicine.  You have uncontrolled bleeding.  You  have difficulty having a bowel movement, or you are unable to have a bowel movement.  You have pain or inflammation outside the area of the hemorrhoids. This information is not intended to replace advice given to you by your health care provider. Make sure you discuss any questions you have with your health care provider. Document Released: 10/05/2000 Document Revised: 03/07/2016 Document Reviewed: 06/22/2015 Elsevier Interactive Patient Education  2017 [employer] Inc.  

## 2016-12-07 ENCOUNTER — Telehealth: Payer: Self-pay | Admitting: Psychology

## 2016-12-07 ENCOUNTER — Other Ambulatory Visit: Payer: Self-pay | Admitting: Obstetrics and Gynecology

## 2016-12-07 MED ORDER — QUETIAPINE FUMARATE 300 MG PO TABS: 300.0000 mg | ORAL_TABLET | Freq: Every day | ORAL | 0 refills | Status: DC

## 2016-12-07 NOTE — Telephone Encounter (Signed)
Pt came into office. Her psychiatrist requested dr Gerarda Fraction refill her Seroquel.  [place].  She has 4 pills left. She will be out on Monday

## 2016-12-07 NOTE — Telephone Encounter (Signed)
Will forward to MD. Jazmin Hartsell,CMA  

## 2016-12-07 NOTE — Telephone Encounter (Signed)
[facility] Caroleen Hamman) called patient. Nephew picked up the phone and said patient was not at home. [facility] informed will call again later.

## 2016-12-10 ENCOUNTER — Telehealth: Payer: Self-pay

## 2016-12-10 MED FILL — QUETIAPINE 300 MG TABLET: 300 | 30 days supply | Qty: 30 | Fill #0

## 2016-12-12 ENCOUNTER — Encounter: Payer: Self-pay | Admitting: Obstetrics and Gynecology

## 2016-12-12 ENCOUNTER — Ambulatory Visit (INDEPENDENT_AMBULATORY_CARE_PROVIDER_SITE_OTHER): Payer: Medicare PPO | Admitting: Obstetrics and Gynecology

## 2016-12-12 VITALS — BP 130/80 | HR 113 | Temp 98.0°F | Wt 301.0 lb

## 2016-12-12 DIAGNOSIS — F4321 Adjustment disorder with depressed mood: Secondary | ICD-10-CM

## 2016-12-12 DIAGNOSIS — J441 Chronic obstructive pulmonary disease with (acute) exacerbation: Secondary | ICD-10-CM

## 2016-12-12 DIAGNOSIS — F4320 Adjustment disorder, unspecified: Secondary | ICD-10-CM | POA: Diagnosis not present

## 2016-12-12 LAB — CBC
HCT: 41.7 % (ref 35.0–45.0)
Hemoglobin: 14.1 g/dL (ref 11.7–15.5)
MCH: 28.7 pg (ref 27.0–33.0)
MCHC: 33.8 g/dL (ref 32.0–36.0)
MCV: 84.8 fL (ref 80.0–100.0)
MPV: 8.8 fL (ref 7.5–12.5)
Platelets: 310 10*3/uL (ref 140–400)
RBC: 4.92 MIL/uL (ref 3.80–5.10)
RDW: 15.9 % — ABNORMAL HIGH (ref 11.0–15.0)
WBC: 8 10*3/uL (ref 3.8–10.8)

## 2016-12-12 MED ORDER — HYDROCODONE-CHLORPHENIRAMINE 5-4 MG/5ML PO SOLN: ORAL | 0 refills | Status: DC

## 2016-12-12 MED ORDER — AMOXICILLIN-POT CLAVULANATE 500-125 MG PO TABS: 1.0000 | ORAL_TABLET | Freq: Three times a day (TID) | ORAL | 0 refills | Status: DC

## 2016-12-12 MED ORDER — AMOXICILLIN-POT CLAVULANATE 500-125 MG PO TABS: 1.0000 | ORAL_TABLET | Freq: Three times a day (TID) | ORAL | 0 refills | Status: AC

## 2016-12-12 MED FILL — AMOX-CLAV 500-125 MG TABLET: 500-125 | 5 days supply | Qty: 15 | Fill #0

## 2016-12-12 MED FILL — HYDROCODONE-CHLORPHEN ER SU: 10-8 | 10 days supply | Qty: 100 | Fill #0

## 2016-12-12 NOTE — Progress Notes (Signed)
Subjective:   Patient ID: Kimberly [name], female    DOB: 1971/01/14, 46 y.o.   MRN: [id number]  Patient presents for Same Day Appointment  Chief Complaint  Patient presents with  . Nasal Congestion    HPI: # Congestion Has been sick for almost a 3 weeks States she was seen in our clinic and given antibiotics and prednisone but that didn't work Dx of bronchitis Has had fevers and chills symptoms include congestion, sever cough, and sputum  #Mental Health Continues to grieve lose of husband Was supposed to follow-up with [facility]; I had scheduled appointment [facility] canceled appointment stating it wasn't time for patient to be seen  Feels depression is worse Grief counseling when children starts next week  Finances are improving Has court date in March 7th so that sister can become her payee  Review of Systems   See HPI for ROS.   History  Smoking Status  . Current Every Day Smoker  . Packs/day: 1.00  . Years: 32.00  . Types: Cigarettes  Smokeless Tobacco  . Never Used    Comment: 11/26/14 smoking 1 pack every 2 days    Past medical history, surgical, family, and social history reviewed and updated in the EMR as appropriate.  Pertinent Historical Findings include: Mixed bipolar 1 disorder Objective:  BP 130/80   Pulse (!) 113   Temp 98 F (36.7 C) (Oral)   Wt (!) 301 lb (136.5 kg)   SpO2 95%   BMI 44.45 kg/m  Vitals and nursing note reviewed  Physical Exam  Constitutional: She is well-developed, well-nourished, and in no distress.  Sad-appearance  HENT:  Mouth/Throat: Oropharynx is clear and moist.  Eyes: Conjunctivae are normal.  Neck: Normal range of motion. Neck supple.  Cardiovascular: Regular rhythm and normal heart sounds.  Tachycardia present.   Pulmonary/Chest: Effort normal. She has no wheezes. She has rhonchi.  Psychiatric: She exhibits a depressed mood. She expresses no homicidal and no suicidal ideation. She has a flat affect.    Tearful   Depression screen [facility] 2/9 12/12/2016  Decreased Interest 3  Down, Depressed, Hopeless 3  PHQ - 2 Score 6  Altered sleeping 3  Tired, decreased energy 3  Change in appetite 3  Feeling bad or failure about yourself  3  Trouble concentrating 3  Moving slowly or fidgety/restless 3  Suicidal thoughts 0  PHQ-9 Score 24  Difficult doing work/chores Very difficult  Some recent data might be hidden    Assessment & Plan:  1. Bronchitis, chronic obstructive, with exacerbation ([place]) Continues to have exacerbation of bronchitis based off clinical presentation. Will obtain chest x-ray to make sure she has no underlying pneumonia. Obtain blood work as well. Rx written for Augmentin. Patient has h/o prolonged QT interval plus her psych medications prolong QT so avoided QTc prolonging medications. Medication given for cough as well.  - DG Chest 2 View; Future - CBC  2. Grieving Continues to grieve husband. Has not even been a month since his passing. PHQ-9 scores are up but believe it is due to grief and not worsening depression. Patient to continue all her psych medications. DO not want to adjust as she is getting managed elsewhere. To follow-up with [facility] in the beginning of March. No SI/HI. Patient to start grief counseling next week. Continued support for patient offered.   Meds ordered this encounter  Medications  . Hydrocodone-Chlorpheniramine 5-4 MG/5ML SOLN    Sig: Oral Every 12 hours PRN for coughing  Dispense:  100 mL    Refill:  0  . DISCONTD: amoxicillin-clavulanate (AUGMENTIN) 500-125 MG tablet    Sig: Take 1 tablet (500 mg total) by mouth 3 (three) times daily.    Dispense:  15 tablet    Refill:  0  . amoxicillin-clavulanate (AUGMENTIN) 500-125 MG tablet    Sig: Take 1 tablet (500 mg total) by mouth 3 (three) times daily.    Dispense:  15 tablet    Refill:  0     Diagnosis and plan along with any newly prescribed medication(s) were discussed in detail  with this patient today. The patient verbalized understanding and agreed with the plan. Patient advised if symptoms worsen return to clinic or ER.   PATIENT EDUCATION PROVIDED: See AVS   Luiz Blare, DO 12/12/2016, 10:45 AM PGY-3, [place]

## 2016-12-12 NOTE — Progress Notes (Deleted)
   Subjective:   Patient ID: Kimberly [name], female    DOB: 30-Jan-1971, 47 y.o.   MRN: [id number]  Patient presents for Same Day Appointment  Chief Complaint  Patient presents with  . Nasal Congestion    HPI: # ***   Review of Systems   See HPI for ROS.   History  Smoking Status  . Current Every Day Smoker  . Packs/day: 1.00  . Years: 32.00  . Types: Cigarettes  Smokeless Tobacco  . Never Used    Comment: 11/26/14 smoking 1 pack every 2 days    Past medical history, surgical, family, and social history reviewed and updated in the EMR as appropriate.  Pertinent Historical Findings include: Objective:  BP 130/80   Pulse (!) 113   Temp 98 F (36.7 C) (Oral)   Wt (!) 301 lb (136.5 kg)   SpO2 95%   BMI 44.45 kg/m  Vitals and nursing note reviewed  Physical Exam  Assessment & Plan:  There are no diagnoses linked to this encounter.    Diagnosis and plan along with any newly prescribed medication(s) were discussed in detail with this patient today. The patient verbalized understanding and agreed with the plan. Patient advised if symptoms worsen return to clinic or ER.   PATIENT EDUCATION PROVIDED: See AVS   Luiz Blare, DO 12/12/2016, 10:40 AM PGY-3, [place]

## 2016-12-12 NOTE — Patient Instructions (Signed)
Antibiotics and cough medicine sent to pharmacy Follow-up with grief counselor Go get chest x-ray  Follow-up with me or ED if symptoms not improved in 5 days

## 2016-12-13 ENCOUNTER — Ambulatory Visit
Admission: RE | Admit: 2016-12-13 | Discharge: 2016-12-13 | Disposition: A | Discharge status: Home / Self Care | Payer: Medicare PPO | Source: Ambulatory Visit | Attending: Family Medicine | Admitting: Family Medicine

## 2016-12-13 DIAGNOSIS — J441 Chronic obstructive pulmonary disease with (acute) exacerbation: Secondary | ICD-10-CM

## 2016-12-13 NOTE — Telephone Encounter (Signed)
Error

## 2016-12-14 ENCOUNTER — Telehealth: Payer: Self-pay | Admitting: Psychology

## 2016-12-14 NOTE — Telephone Encounter (Signed)
[facility] Caroleen Hamman) attempted to reach patient to discuss therapy referrals per patient's request. The patient's number did not work.

## 2016-12-19 ENCOUNTER — Telehealth: Payer: Self-pay | Admitting: Obstetrics and Gynecology

## 2016-12-19 NOTE — Telephone Encounter (Signed)
Spoke with sister regarding number not working and she stated that she was unable to talk right now (sister brunhilda) and that she would have patient call back.  If she does please inform her that per Dr. Gerarda Fraction her xray was negative. Jazmin Hartsell,CMA

## 2016-12-19 NOTE — Telephone Encounter (Signed)
Would like results from chest xray done on Friday

## 2016-12-24 NOTE — Telephone Encounter (Signed)
Pt was advised. ep °

## 2017-01-08 ENCOUNTER — Encounter: Payer: Self-pay | Admitting: Family Medicine

## 2017-01-08 ENCOUNTER — Ambulatory Visit (INDEPENDENT_AMBULATORY_CARE_PROVIDER_SITE_OTHER): Payer: Medicare PPO | Admitting: Family Medicine

## 2017-01-08 VITALS — BP 124/86 | HR 112 | Temp 97.6°F | Ht 69.0 in | Wt 297.0 lb

## 2017-01-08 DIAGNOSIS — L720 Epidermal cyst: Secondary | ICD-10-CM | POA: Diagnosis not present

## 2017-01-08 MED ORDER — DOXYCYCLINE HYCLATE 100 MG PO TABS: 100.0000 mg | ORAL_TABLET | Freq: Two times a day (BID) | ORAL | 0 refills | Status: DC

## 2017-01-08 MED FILL — DOXYCYCLINE HYCLATE 100 MG: 100 | 10 days supply | Qty: 20 | Fill #0

## 2017-01-08 NOTE — Patient Instructions (Signed)
Please return in two weeks for next evaluation. We will re-examine the cyst at that visit and determine if it can be removed here or if referral to Dermatology is warranted.  Dr. Gerlean Ren   Epidermal Cyst An epidermal cyst is sometimes called an epidermal inclusion cyst or an infundibular cyst. It is a sac made of skin tissue. The sac contains a substance called keratin. Keratin is a protein that is normally secreted through the hair follicles. When keratin becomes trapped in the top layer of skin (epidermis), it can form an epidermal cyst. Epidermal cysts are usually found on the face, neck, trunk, and genitals. These cysts are usually harmless (benign), and they may not cause symptoms unless they become infected. It is important not to pop epidermal cysts yourself. What are the causes? This condition may be caused by:  A blocked hair follicle.  A hair that curls and re-enters the skin instead of growing straight out of the skin (ingrown hair).  A blocked pore.  Irritated skin.  An injury to the skin.  Certain conditions that are passed along from parent to child (inherited).  Human papillomavirus (HPV). What increases the risk? The following factors may make you more likely to develop an epidermal cyst:  Having acne.  Being overweight.  Wearing tight clothing. What are the signs or symptoms? The only symptom of this condition may be a small, painless lump underneath the skin. When an epidermal cyst becomes infected, symptoms may include:  Redness.  Inflammation.  Tenderness.  Warmth.  Fever.  Keratin draining from the cyst. Keratin may look like a grayish-white, bad-smelling substance.  Pus draining from the cyst. How is this diagnosed? This condition is diagnosed with a physical exam. In some cases, you may have a sample of tissue (biopsy) taken from your cyst to be examined under a microscope or tested for bacteria. You may be referred to a health care provider who  specializes in skin care (dermatologist). How is this treated? In many cases, epidermal cysts go away on their own without treatment. If a cyst becomes infected, treatment may include:  Opening and draining the cyst. After draining, minor surgery to remove the rest of the cyst may be done.  Antibiotic medicine to help prevent infection.  Injections of medicines (steroids) that help to reduce inflammation.  Surgery to remove the cyst. Surgery may be done if:  The cyst becomes large.  The cyst bothers you.  There is a chance that the cyst could turn into cancer. Follow these instructions at home:  Take over-the-counter and prescription medicines only as told by your health care provider.  If you were prescribed an antibiotic, use it as told by your health care provider. Do not stop using the antibiotic even if you start to feel better.  Keep the area around your cyst clean and dry.  Wear loose, dry clothing.  Do not try to pop your cyst.  Avoid touching your cyst.  Check your cyst every day for signs of infection.  Keep all follow-up visits as told by your health care provider. This is important. How is this prevented?  Wear clean, dry, clothing.  Avoid wearing tight clothing.  Keep your skin clean and dry. Shower or take baths every day.  Wash your body with a benzoyl peroxide wash when you shower or bathe. Contact a health care provider if:  Your cyst develops symptoms of infection.  Your condition is not improving or is getting worse.  You develop a cyst  that looks different from other cysts you have had.  You have a fever. Get help right away if:  Redness spreads from the cyst into the surrounding area. This information is not intended to replace advice given to you by your health care provider. Make sure you discuss any questions you have with your health care provider. Document Released: 09/08/2004 Document Revised: 06/06/2016 Document Reviewed:  08/10/2015 Elsevier Interactive Patient Education  2017 [employer].

## 2017-01-10 NOTE — Progress Notes (Signed)
Subjective:     Patient ID: Kimberly [name], female   DOB: 05-13-1971, 46 y.o.   MRN: [id number]  HPI Kimberly [name] is a 46yo female presenting today for concern for boil on her back. Reports she has a boil on her back that has been growing larger for the last 3 weeks. Now red and painful. Notes occasional subjective fever, but no measurements of temperature. Denies drainage. Has not taken any medication.  Review of Systems Per HPI    Objective:   Physical Exam  Constitutional: She appears well-developed and well-nourished. No distress.  Cardiovascular: Normal rate and regular rhythm.   No murmur heard. Pulmonary/Chest: Effort normal. No respiratory distress. She has no wheezes.  Skin:  Epidermal cyst palpable over left back measuring 6x5cm with area of erythema measuring 5x3cm, pictured below.        Assessment and Plan:     1. Epidermal cyst Will initiated course of Doxycycline. Return in 2 weeks to re-evaluate for possible removal.

## 2017-02-01 ENCOUNTER — Other Ambulatory Visit: Payer: Self-pay

## 2017-02-01 DIAGNOSIS — C50412 Malignant neoplasm of upper-outer quadrant of left female breast: Secondary | ICD-10-CM

## 2017-02-04 ENCOUNTER — Other Ambulatory Visit: Payer: Commercial Managed Care - HMO

## 2017-02-04 ENCOUNTER — Ambulatory Visit: Payer: Commercial Managed Care - HMO | Admitting: Oncology

## 2017-02-04 NOTE — Progress Notes (Deleted)
[facility]  [phone] [phone]    ID: [name] DOB: 1971-01-10  MR#: [id number]  VVO#:[id number]  Patient Care Team: [name], DO as PCP - General [name], MD as Consulting Physician (Oncology) [name], MD as Attending Physician (Radiation Oncology) [name]  CHIEF COMPLAINT: Estrogen receptor positive breast cancer  CURRENT TREATMENT: Anastrozole  BREAST CANCER HISTORY: From the original intake note:  The patient had bilateral screening mammography with tomography 03/19/2014. This was the patient's first ever mammography. Showed a possible mass in the left breast. Left diagnostic mammography and ultrasonography 04/01/2014 showed an irregular mass in the upper outer quadrant of the left breast with a possible satellite 1 cm anterior to it. On physical exam there was a firm palpable mass at the 1:00 position of the left breast 8 cm from the nipple. There was no palpable left axillary adenopathy. Ultrasound showed an irregular hypoechoic mass in the area in question measuring 1.4 cm. There was a 4 mm nodule located anterior to this. Ultrasound of the left axilla was benign.  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 The second mass was negative for malignancy. This was felt to be concordant.  On 04/08/2014 the patient underwent bilateral breast MRI. This showed a 9 mm enhancing mass in the subareolar area of the right breast in in the left breast, the previously noted mass measuring 1.3 cm. There were no morphologically abnormal lymph nodes  The right breast finding was followed up on 04/19/2014 with ultrasound  which showed an intraductal soft tissue mass in the inferior subareolar worsening of the right breast measuring 8 mm. This was biopsied 04/19/2014 and showed ([id number]) ectatic duct, with no evidence of malignancy. Surgical excision was recommended.  Accordingly on 05/03/2014 the patient underwent right lumpectomy, showing an intraductal papilloma, with known malignancy identified. Left lumpectomy on the same day showed an invasive ductal carcinoma measuring 1.5 cm, with one of the 2 sentinel lymph nodes positive for carcinoma. There was no extracapsular extension. Margins were clear and ample. HER-2 was repeated and was again negative.  The patient's case was discussed in the multidisciplinary breast cancer conference 05/12/2014. An Oncotype had been previously requested and this showed a recurrent score of 22, in the intermediate range. The patient qualifies for the S1007 study and this will be discussed with her. Otherwise the standard recommendation would be chemotherapy followed by radiation followed by anti-estrogens.  The patient's subsequent history is as detailed below.  INTERVAL HISTORY: [name] returns today for follow-up of her breast cancer accompanied by her husband [name] and their 3 children. She has been on anastrozole since March of this year, and just now understands that her hot flashes and vaginal dryness may be as a result of this addition. She has chronic joint pain and stiffness. It is unclear how much anastrozole has changed this. She may have fibromyalgia as the pain is so widespread. She takes NSAIDs PRN which are moderately helpful. The interval history is remarkable for an ED visit for atypical chest pain. She was ruled out for a PE and the troponins were negative.   REVIEW OF SYSTEMS: [name] denies fevers, chills ,nausea, or vomiting.  She has chronically loose stools. She has episodes of urinary incontinence. Her appetite is good. She is chronically fatigued. She  has shortness of breath with exertion and uses and inhaler PRN. She has frequent headaches. She endorses anxiety, depression, and a drop in libido. She is getting counseling and psychotropics through the [facility]. She wants to quit smoking but chantix is too expensive. A detailed review of systems is otherwise stable.  PAST MEDICAL HISTORY: Past Medical History:  Diagnosis Date  . Anemia   . Arthritis   . Bipolar 1 disorder ([place])   . Blood transfusion without reported diagnosis 2007   post bleeding childbirth  . Cancer ([place])    eval  lt breast cancer  . Diabetes mellitus without complication ([place])    gestational  . Heart murmur    as a child-not adult-no cardiac work up  . History of radiation therapy 08/31/14-10/21/14   left breast/ left supraclavicular 50.4 Gy 28 fx, lef tposterior axillary boost 9.52 Gy 28 fx, left rbeast boost/ 10 Gy 5 fx    PAST SURGICAL HISTORY: Past Surgical History:  Procedure Laterality Date  . BREAST BIOPSY Left 04/01/14  . BREAST BIOPSY Right 04/19/14  . BREAST LUMPECTOMY WITH AXILLARY LYMPH NODE DISSECTION Bilateral 05/03/14  . DILATION AND CURETTAGE OF UTERUS     after childbirth  . MULTIPLE TOOTH EXTRACTIONS     only 5 left  . PORTACATH PLACEMENT Right 06/18/2014   Procedure: INSERTION PORT-A-CATH;  Surgeon: [name], MD;  Location: [facility];  Service: General;  Laterality: Right;  . TONSILLECTOMY AND ADENOIDECTOMY      FAMILY HISTORY Family History  Problem Relation Age of Onset  . Heart disease Father   . Cancer Father     Prostate   The patient's father is currently living at age 9. The patient's mother was murdered at age 52 by the patient's mother's sister. The patient has 2 brothers, one sister. The patient's father hase prostate cancer, diagnosed at age 41. There is no history of breast or ovarian cancer in the family.  GYNECOLOGIC HISTORY:  No LMP recorded. Patient is not currently having periods (Reason:  Perimenopausal). Menarche age 14, first live birth age 46, the patient is [place] P5. The patient has not had a period in the last 2 months but tells me she has tested for pregnancy x2 and she is not pregnant.  SOCIAL HISTORY:   the patient tells me she is disabled secondary to a nervous breakdown. She is currently in her third marriage. Her husband, Kairee Kozma, is retired from [employer]. He is disabled secondary to seizures. The patient's children are [name] and [name], age 5 and 69, [name] age 50,  and [place] and [name] age 57 and 47. In addition a nephew, [name], 38, lives with her family.     ADVANCED DIRECTIVES:  not in place    HEALTH MAINTENANCE: Social History  Substance Use Topics  . Smoking status: Current Every Day Smoker    Packs/day: 1.00    Years: 32.00    Types: Cigarettes  . Smokeless tobacco: Never Used     Comment: 11/26/14 smoking 1 pack every 2 days  . Alcohol use No     Colonoscopy:  PAP:  Bone density:  Lipid panel:  No Known Allergies  Current Outpatient Prescriptions  Medication Sig Dispense Refill  . anastrozole (ARIMIDEX) 1 MG tablet Take 1 tablet (1 mg total) by mouth daily. 30 tablet 6  .  clonazePAM (KLONOPIN) 1 MG tablet Take 1 mg by mouth 3 (three) times daily.     . cyclobenzaprine (FLEXERIL) 10 MG tablet Take 1 tablet (10 mg total) by mouth 3 (three) times daily as needed for muscle spasms. 30 tablet 0  . docusate sodium (COLACE) 100 MG capsule Take 1 capsule (100 mg total) by mouth 2 (two) times daily. 28 capsule 0  . doxycycline (VIBRA-TABS) 100 MG tablet Take 1 tablet (100 mg total) by mouth 2 (two) times daily. 20 tablet 0  . ferrous sulfate 325 (65 FE) MG tablet Take 1 tablet (325 mg total) by mouth daily with breakfast. 90 tablet 3  . gabapentin (NEURONTIN) 300 MG capsule Take 1 capsule (300 mg total) by mouth 2 (two) times daily. 90 capsule 2  . HYDROcodone-acetaminophen (NORCO/VICODIN) 5-325 MG tablet Take 1 tablet by  mouth every 4 (four) hours as needed. 10 tablet 0  . Hydrocodone-Chlorpheniramine 5-4 MG/5ML SOLN Oral Every 12 hours PRN for coughing 100 mL 0  . hydrocortisone (ANUSOL-HC) 25 MG suppository Place 1 suppository (25 mg total) rectally 2 (two) times daily. 12 suppository 0  . lamoTRIgine (LAMICTAL) 200 MG tablet Take 1 tablet (200 mg total) by mouth 2 (two) times daily. 28 tablet 0  . Lidocaine-Hydrocortisone Ace 2-2 % KIT Place 1 application rectally 2 (two) times daily as needed. For hemorrhoidal pain. 1 each 0  . naproxen (NAPROSYN) 500 MG tablet Take 1 tablet (500 mg total) by mouth 2 (two) times daily with a meal. 90 tablet 4  . omeprazole (PRILOSEC) 40 MG capsule Take 1 capsule (40 mg total) by mouth daily. 30 capsule 12  . polyethylene glycol (MIRALAX) packet Take 17 g by mouth daily. 14 each 0  . prazosin (MINIPRESS) 2 MG capsule     . QUEtiapine (SEROQUEL) 300 MG tablet Take 1 tablet (300 mg total) by mouth at bedtime. 30 tablet 0  . risperiDONE (RISPERDAL) 3 MG tablet Take 1 tablet (3 mg total) by mouth at bedtime. 30 tablet 0   No current facility-administered medications for this visit.     OBJECTIVE: middle-aged [place] woman who appears older than stated age There were no vitals filed for this visit.   There is no height or weight on file to calculate BMI.    ECOG FS:2 - Symptomatic, <50% confined to bed  Skin: warm, dry  HEENT: sclerae anicteric, conjunctivae pink, oropharynx clear. No thrush or mucositis.  Lymph Nodes: No cervical or supraclavicular lymphadenopathy  Lungs: clear to auscultation bilaterally, no rales, wheezes, or rhonci  Heart: regular rate and rhythm  Abdomen: round, soft, non tender, positive bowel sounds  Musculoskeletal: No focal spinal tenderness, no peripheral edema  Neuro: non focal, well oriented, positive affect  Breasts: left breast status post lumpectomy and radiation. Mild residual hyperpigmentation noted. No evidence of recurrent disease. Left  axilla benign. Right breast unremarkable.   LAB RESULTS:  CMP     Component Value Date/Time   NA 138 10/07/2015 1204   NA 139 01/19/2015 1422   K 3.8 10/07/2015 1204   K 3.8 01/19/2015 1422   CL 102 10/07/2015 1204   CO2 22 10/07/2015 1141   CO2 23 01/19/2015 1422   GLUCOSE 92 10/07/2015 1204   GLUCOSE 93 01/19/2015 1422   BUN 7 10/07/2015 1204   BUN 8.6 01/19/2015 1422   CREATININE 0.90 10/07/2015 1204   CREATININE 1.1 01/19/2015 1422   CALCIUM 9.4 10/07/2015 1141   CALCIUM 9.3 01/19/2015 1422   PROT  7.3 01/19/2015 1422   ALBUMIN 3.9 01/19/2015 1422   AST 14 01/19/2015 1422   ALT 13 01/19/2015 1422   ALKPHOS 82 01/19/2015 1422   BILITOT 0.35 01/19/2015 1422   GFRNONAA >60 10/07/2015 1141   GFRAA >60 10/07/2015 1141    I No results found for: SPEP  Lab Results  Component Value Date   WBC 8.0 12/12/2016   NEUTROABS 2.9 01/19/2015   HGB 14.1 12/12/2016   HCT 41.7 12/12/2016   MCV 84.8 12/12/2016   PLT 310 12/12/2016      Chemistry      Component Value Date/Time   NA 138 10/07/2015 1204   NA 139 01/19/2015 1422   K 3.8 10/07/2015 1204   K 3.8 01/19/2015 1422   CL 102 10/07/2015 1204   CO2 22 10/07/2015 1141   CO2 23 01/19/2015 1422   BUN 7 10/07/2015 1204   BUN 8.6 01/19/2015 1422   CREATININE 0.90 10/07/2015 1204   CREATININE 1.1 01/19/2015 1422      Component Value Date/Time   CALCIUM 9.4 10/07/2015 1141   CALCIUM 9.3 01/19/2015 1422   ALKPHOS 82 01/19/2015 1422   AST 14 01/19/2015 1422   ALT 13 01/19/2015 1422   BILITOT 0.35 01/19/2015 1422       Lab Results  Component Value Date   LABCA2 28 04/30/2014    No components found for: [id number]  No results for input(s): INR in the last 168 hours.  Urinalysis    Component Value Date/Time   COLORURINE YELLOW 07/10/2014 1951   APPEARANCEUR CLEAR 07/10/2014 1951   LABSPEC 1.017 07/10/2014 1951   PHURINE 6.0 07/10/2014 1951   GLUCOSEU NEGATIVE 07/10/2014 1951   HGBUR NEGATIVE 07/10/2014  1951   BILIRUBINUR NEGATIVE 07/10/2014 1951   KETONESUR NEGATIVE 07/10/2014 1951   PROTEINUR NEGATIVE 07/10/2014 1951   UROBILINOGEN 0.2 07/10/2014 1951   NITRITE NEGATIVE 07/10/2014 1951   LEUKOCYTESUR TRACE (A) 07/10/2014 1951    STUDIES: No results found.   ASSESSMENT: 46 y.o. BRCA negative [place] woman status post left lumpectomy and sentinel lymph node sampling 05/03/2014 for a pT1c pN0, stage IA invasive ductal carcinoma, grade 2, estrogen receptor 83% positive, progesterone receptor 66% positive, with an MIB-1 of 14% and no HER-2 amplification.  (1) Oncotype score of 22 predicts a risk of outside the breast recurrence within 10 years of 13% if the patient's only systemic therapy is tamoxifen for 5 years.  (2) adjuvant chemotherapy with cyclophosphamide and docetaxel started 07/06/2014, patient tolerated chemotherapy very poorly and it was discontinued after one cycle.    (3) adjuvant radiation completed 10/21/2014 1) Left Breast / 50.4 Gy in 28 fractions 2) Left supraclavicular / 50.4 Gy in 28 fractions 3) Left Posterior axillary boost / 9.52 Gy in 28 fractions 4) Left Breast Boost / 10 Gy in 5 fractions  (4) anastrozole started 01/19/2015  (a) bone density?  PLAN: [name] is stable by exam today. She is overdue for her yearly mammogram, so I have placed orders for that to be performed within in the next month. She is managing ok with the anastrozole, despite hot flashes and vaginal dryness. The join pain to some degree existed prior to anastrozole use, but I'm sure some of the stiffness can be attributed to this drug. Nevertheless she is complaint with the prescription. She will continue to use naproxen for the pain. I have encouraged physical activity, light stretching, and weight loss as possible interventions for this symptom.   [name] will return in  April for follow up. She understands and agrees with this plan. She knows the goal of treatment in her case is  cure. She has been encouraged to cal with any issues that might arise before her next visit here.   [name], MD  02/04/2017 12:20 PM

## 2017-02-25 ENCOUNTER — Ambulatory Visit (INDEPENDENT_AMBULATORY_CARE_PROVIDER_SITE_OTHER): Payer: Medicare PPO | Admitting: Obstetrics and Gynecology

## 2017-02-25 ENCOUNTER — Encounter: Payer: Self-pay | Admitting: Obstetrics and Gynecology

## 2017-02-25 VITALS — BP 112/84 | HR 127 | Temp 98.2°F | Wt 294.0 lb

## 2017-02-25 DIAGNOSIS — J441 Chronic obstructive pulmonary disease with (acute) exacerbation: Secondary | ICD-10-CM | POA: Diagnosis not present

## 2017-02-25 DIAGNOSIS — R0602 Shortness of breath: Secondary | ICD-10-CM | POA: Diagnosis not present

## 2017-02-25 MED ORDER — TIOTROPIUM BROMIDE MONOHYDRATE 18 MCG IN CAPS: 18.0000 ug | ORAL_CAPSULE | Freq: Every day | RESPIRATORY_TRACT | 12 refills | Status: DC

## 2017-02-25 MED ORDER — ALBUTEROL SULFATE HFA 108 (90 BASE) MCG/ACT IN AERS: 2.0000 | INHALATION_SPRAY | Freq: Four times a day (QID) | RESPIRATORY_TRACT | 1 refills | Status: DC | PRN

## 2017-02-25 MED ORDER — ALBUTEROL SULFATE (2.5 MG/3ML) 0.083% IN NEBU
2.5000 mg | INHALATION_SOLUTION | Freq: Once | RESPIRATORY_TRACT | Status: AC
  Administered 2017-02-25: 2.5 mg via RESPIRATORY_TRACT

## 2017-02-25 MED ORDER — IPRATROPIUM BROMIDE 0.02 % IN SOLN
0.5000 mg | Freq: Once | RESPIRATORY_TRACT | Status: AC
  Administered 2017-02-25: 0.5 mg via RESPIRATORY_TRACT

## 2017-02-25 MED ORDER — LEVOFLOXACIN 500 MG PO TABS: 500.0000 mg | ORAL_TABLET | Freq: Every day | ORAL | 0 refills | Status: DC

## 2017-02-25 MED ORDER — PREDNISONE 20 MG PO TABS: 40.0000 mg | ORAL_TABLET | Freq: Every day | ORAL | 0 refills | Status: AC

## 2017-02-25 MED ORDER — HYDROCODONE-CHLORPHENIRAMINE 5-4 MG/5ML PO SOLN: ORAL | 0 refills | Status: DC

## 2017-02-25 MED FILL — predniSONE 20 MG TABS: 20 | 5 days supply | Qty: 10 | Fill #0

## 2017-02-25 MED FILL — VENTOLIN HFA 90 MCG INHALER: 108 (90 BAS | 30 days supply | Qty: 18 | Fill #0

## 2017-02-25 MED FILL — levoFLOXacin 500 MG TABS: 500 | 7 days supply | Qty: 7 | Fill #0

## 2017-02-25 MED FILL — HYDROCODONE-CHLORPHEN ER SU: 10-8 | 10 days supply | Qty: 100 | Fill #0

## 2017-02-25 NOTE — Patient Instructions (Addendum)
Referral placed to pulmonology  Treating you with bronchitis again with antibiotics, steroids, and cough medicine.  Go get chest x-ray  Practice deep breathing  New inhalers sent to pharmacy

## 2017-02-25 NOTE — Progress Notes (Signed)
   Subjective:   Patient ID: Kimberly [name], female    DOB: Jan 29, 1971, 46 y.o.   MRN: [id number]  Patient presents for Same Day Appointment  Chief Complaint  Patient presents with  . Shortness of Breath    HPI: # Dyspnea Has been short of breath for months now States that it started after her husbands death associated coughing, wheezing, lightheadedness Has been treated twice for bronchitis exacerbation Is current smoker but has no reported COPD (no PFTs have been performed) Shortness of breath worse after coughing Denies fevers Does not have any medications to help  Symptoms Fever: no Sputum: yes Wheezing or asthma: yes Leg swelling: no Chest Pain: no Immobility: no  Review of Systems   See HPI for ROS.   History  Smoking Status  . Current Every Day Smoker  . Packs/day: 1.00  . Years: 32.00  . Types: Cigarettes  Smokeless Tobacco  . Never Used    Comment: 11/26/14 smoking 1 pack every 2 days    Past medical history, surgical, family, and social history reviewed and updated in the EMR as appropriate.  Pertinent Historical Findings include: Objective:  BP 112/84   Pulse (!) 127   Temp 98.2 F (36.8 C) (Oral)   Wt 294 lb (133.4 kg)   SpO2 97%   BMI 43.42 kg/m  Vitals and nursing note reviewed  Physical Exam Gen: Distressed CV: Tachycardia, normal rhythm, with no murmurs appreciated Pulm: Increased work of breathing. Diffuse rhonchi and wheezes noted.  MSK: no edema, cyanosis, or clubbing noted Skin: warm, dry Neuro: grossly normal, moves all extremities Psych: Anxious   Assessment & Plan:  1. Obstructive chronic bronchitis with exacerbation ([place]) With bronchospasm and acute exacerbation. Patient likely with COPD. Will place referral for pulmonology for PFT testing and management assistance. Patient given duoneb treatment in clinic with improvement in symptoms. Rx given for Spiriva and albuterol to help with symptoms. Will treat with course of  Levaquin and steroids for bronchitis. Cough medication also given. Ordered placed for chest x-ray. I believe patient's grief and anxiety are worsening symptoms.  - Ambulatory referral to Pulmonology  2. Shortness of breath - DG Chest 2 View; Future - albuterol (PROVENTIL) (2.5 MG/3ML) 0.083% nebulizer solution 2.5 mg; Take 3 mLs (2.5 mg total) by nebulization once. - ipratropium (ATROVENT) nebulizer solution 0.5 mg; Take 2.5 mLs (0.5 mg total) by nebulization once.  Diagnosis and plan along with any newly prescribed medication(s) were discussed in detail with this patient today. The patient verbalized understanding and agreed with the plan. Patient advised if symptoms worsen return to clinic or ER.   PATIENT EDUCATION PROVIDED: See AVS   Luiz Blare, DO 02/25/2017, 4:21 PM PGY-3, [facility]

## 2017-03-06 ENCOUNTER — Emergency Department (HOSPITAL_COMMUNITY): Payer: Medicare PPO

## 2017-03-06 ENCOUNTER — Ambulatory Visit (INDEPENDENT_AMBULATORY_CARE_PROVIDER_SITE_OTHER): Payer: Medicare PPO | Admitting: Obstetrics and Gynecology

## 2017-03-06 ENCOUNTER — Inpatient Hospital Stay (HOSPITAL_COMMUNITY)
Admission: EM | Admit: 2017-03-06 | Discharge: 2017-03-08 | DRG: 191 | Disposition: A | Discharge status: Home / Self Care | Payer: Medicare PPO | Attending: Family Medicine | Admitting: Family Medicine

## 2017-03-06 ENCOUNTER — Other Ambulatory Visit: Payer: Self-pay

## 2017-03-06 ENCOUNTER — Encounter (HOSPITAL_COMMUNITY): Payer: Self-pay | Admitting: Emergency Medicine

## 2017-03-06 ENCOUNTER — Encounter: Payer: Self-pay | Admitting: Obstetrics and Gynecology

## 2017-03-06 VITALS — BP 132/60 | HR 116 | Temp 98.4°F

## 2017-03-06 DIAGNOSIS — R0602 Shortness of breath: Secondary | ICD-10-CM

## 2017-03-06 DIAGNOSIS — I10 Essential (primary) hypertension: Secondary | ICD-10-CM | POA: Diagnosis present

## 2017-03-06 DIAGNOSIS — R000 Tachycardia, unspecified: Secondary | ICD-10-CM

## 2017-03-06 DIAGNOSIS — Z6841 Body Mass Index (BMI) 40.0 and over, adult: Secondary | ICD-10-CM

## 2017-03-06 DIAGNOSIS — M1990 Unspecified osteoarthritis, unspecified site: Secondary | ICD-10-CM | POA: Diagnosis present

## 2017-03-06 DIAGNOSIS — M25551 Pain in right hip: Secondary | ICD-10-CM | POA: Diagnosis not present

## 2017-03-06 DIAGNOSIS — D509 Iron deficiency anemia, unspecified: Secondary | ICD-10-CM | POA: Diagnosis present

## 2017-03-06 DIAGNOSIS — R05 Cough: Secondary | ICD-10-CM

## 2017-03-06 DIAGNOSIS — G8929 Other chronic pain: Secondary | ICD-10-CM | POA: Diagnosis not present

## 2017-03-06 DIAGNOSIS — Z923 Personal history of irradiation: Secondary | ICD-10-CM | POA: Diagnosis not present

## 2017-03-06 DIAGNOSIS — Z9221 Personal history of antineoplastic chemotherapy: Secondary | ICD-10-CM | POA: Diagnosis not present

## 2017-03-06 DIAGNOSIS — F17200 Nicotine dependence, unspecified, uncomplicated: Secondary | ICD-10-CM | POA: Diagnosis not present

## 2017-03-06 DIAGNOSIS — J441 Chronic obstructive pulmonary disease with (acute) exacerbation: Principal | ICD-10-CM | POA: Diagnosis present

## 2017-03-06 DIAGNOSIS — W19XXXA Unspecified fall, initial encounter: Secondary | ICD-10-CM

## 2017-03-06 DIAGNOSIS — R0902 Hypoxemia: Secondary | ICD-10-CM | POA: Diagnosis present

## 2017-03-06 DIAGNOSIS — F17210 Nicotine dependence, cigarettes, uncomplicated: Secondary | ICD-10-CM | POA: Diagnosis present

## 2017-03-06 DIAGNOSIS — Z853 Personal history of malignant neoplasm of breast: Secondary | ICD-10-CM

## 2017-03-06 DIAGNOSIS — Z23 Encounter for immunization: Secondary | ICD-10-CM

## 2017-03-06 DIAGNOSIS — Z8632 Personal history of gestational diabetes: Secondary | ICD-10-CM | POA: Diagnosis not present

## 2017-03-06 DIAGNOSIS — Z8042 Family history of malignant neoplasm of prostate: Secondary | ICD-10-CM | POA: Diagnosis not present

## 2017-03-06 DIAGNOSIS — F3160 Bipolar disorder, current episode mixed, unspecified: Secondary | ICD-10-CM | POA: Diagnosis not present

## 2017-03-06 DIAGNOSIS — Z8249 Family history of ischemic heart disease and other diseases of the circulatory system: Secondary | ICD-10-CM

## 2017-03-06 DIAGNOSIS — R0609 Other forms of dyspnea: Secondary | ICD-10-CM

## 2017-03-06 DIAGNOSIS — R059 Cough, unspecified: Secondary | ICD-10-CM

## 2017-03-06 DIAGNOSIS — R0600 Dyspnea, unspecified: Secondary | ICD-10-CM

## 2017-03-06 DIAGNOSIS — M545 Low back pain: Secondary | ICD-10-CM | POA: Diagnosis not present

## 2017-03-06 DIAGNOSIS — S79911A Unspecified injury of right hip, initial encounter: Secondary | ICD-10-CM | POA: Diagnosis not present

## 2017-03-06 DIAGNOSIS — I350 Nonrheumatic aortic (valve) stenosis: Secondary | ICD-10-CM | POA: Diagnosis not present

## 2017-03-06 LAB — CBC WITH DIFFERENTIAL/PLATELET
Basophils Absolute: 0 10*3/uL (ref 0.0–0.1)
Basophils Relative: 0 %
Eosinophils Absolute: 0.1 10*3/uL (ref 0.0–0.7)
Eosinophils Relative: 1 %
HCT: 45.4 % (ref 36.0–46.0)
Hemoglobin: 14.8 g/dL (ref 12.0–15.0)
Lymphocytes Relative: 32 %
Lymphs Abs: 2.7 10*3/uL (ref 0.7–4.0)
MCH: 28.5 pg (ref 26.0–34.0)
MCHC: 32.6 g/dL (ref 30.0–36.0)
MCV: 87.5 fL (ref 78.0–100.0)
Monocytes Absolute: 0.8 10*3/uL (ref 0.1–1.0)
Monocytes Relative: 9 %
Neutro Abs: 4.9 10*3/uL (ref 1.7–7.7)
Neutrophils Relative %: 58 %
Platelets: 231 10*3/uL (ref 150–400)
RBC: 5.19 MIL/uL — ABNORMAL HIGH (ref 3.87–5.11)
RDW: 16.6 % — ABNORMAL HIGH (ref 11.5–15.5)
WBC: 8.4 10*3/uL (ref 4.0–10.5)

## 2017-03-06 LAB — I-STAT TROPONIN, ED: Troponin i, poc: 0 ng/mL (ref 0.00–0.08)

## 2017-03-06 LAB — COMPREHENSIVE METABOLIC PANEL
ALT: 21 U/L (ref 14–54)
AST: 24 U/L (ref 15–41)
Albumin: 3.9 g/dL (ref 3.5–5.0)
Alkaline Phosphatase: 57 U/L (ref 38–126)
Anion gap: 8 (ref 5–15)
BUN: 7 mg/dL (ref 6–20)
CO2: 28 mmol/L (ref 22–32)
Calcium: 9.2 mg/dL (ref 8.9–10.3)
Chloride: 103 mmol/L (ref 101–111)
Creatinine, Ser: 0.95 mg/dL (ref 0.44–1.00)
GFR calc Af Amer: >60 mL/min (ref >60)
GFR calc non Af Amer: >60 mL/min (ref >60)
Glucose, Bld: 91 mg/dL (ref 65–99)
Potassium: 3.5 mmol/L (ref 3.5–5.1)
Sodium: 139 mmol/L (ref 135–145)
Total Bilirubin: 0.6 mg/dL (ref 0.3–1.2)
Total Protein: 6.9 g/dL (ref 6.5–8.1)

## 2017-03-06 LAB — BRAIN NATRIURETIC PEPTIDE: B Natriuretic Peptide: 9 pg/mL (ref 0.0–100.0)

## 2017-03-06 MED ORDER — ALBUTEROL SULFATE (2.5 MG/3ML) 0.083% IN NEBU
2.5000 mg | INHALATION_SOLUTION | Freq: Once | RESPIRATORY_TRACT | Status: AC
  Administered 2017-03-06: 2.5 mg via RESPIRATORY_TRACT

## 2017-03-06 MED ORDER — POLYETHYLENE GLYCOL 3350 17 G PO PACK: 17.0000 g | PACK | Freq: Every day | ORAL | Status: DC | PRN

## 2017-03-06 MED ORDER — PRAZOSIN HCL 2 MG PO CAPS
2.0000 mg | ORAL_CAPSULE | Freq: Every day | ORAL | Status: DC
  Administered 2017-03-07 (×2): 2 mg via ORAL
  Filled 2017-03-06 (×3): qty 1

## 2017-03-06 MED ORDER — CLONAZEPAM 1 MG PO TABS
1.0000 mg | ORAL_TABLET | Freq: Three times a day (TID) | ORAL | Status: DC
  Administered 2017-03-07 – 2017-03-08 (×5): 1 mg via ORAL
  Filled 2017-03-06 (×5): qty 1

## 2017-03-06 MED ORDER — IPRATROPIUM-ALBUTEROL 0.5-2.5 (3) MG/3ML IN SOLN: 3.0000 mL | RESPIRATORY_TRACT | Status: DC | PRN

## 2017-03-06 MED ORDER — ALBUTEROL (5 MG/ML) CONTINUOUS INHALATION SOLN
10.0000 mg/h | INHALATION_SOLUTION | Freq: Once | RESPIRATORY_TRACT | Status: AC
  Administered 2017-03-06: 10 mg/h via RESPIRATORY_TRACT

## 2017-03-06 MED ORDER — PREDNISONE 20 MG PO TABS
40.0000 mg | ORAL_TABLET | Freq: Every day | ORAL | Status: DC
  Administered 2017-03-07 – 2017-03-08 (×2): 40 mg via ORAL
  Filled 2017-03-06 (×3): qty 2

## 2017-03-06 MED ORDER — PANTOPRAZOLE SODIUM 40 MG PO TBEC
40.0000 mg | DELAYED_RELEASE_TABLET | Freq: Every day | ORAL | Status: DC
  Administered 2017-03-07 – 2017-03-08 (×2): 40 mg via ORAL
  Filled 2017-03-06 (×2): qty 1

## 2017-03-06 MED ORDER — ENOXAPARIN SODIUM 40 MG/0.4ML ~~LOC~~ SOLN
40.0000 mg | Freq: Every day | SUBCUTANEOUS | Status: DC
  Administered 2017-03-07 – 2017-03-08 (×2): 40 mg via SUBCUTANEOUS
  Filled 2017-03-06 (×2): qty 0.4

## 2017-03-06 MED ORDER — ALBUTEROL SULFATE (2.5 MG/3ML) 0.083% IN NEBU
2.5000 mg | INHALATION_SOLUTION | RESPIRATORY_TRACT | Status: DC | PRN
  Administered 2017-03-07 (×2): 2.5 mg via RESPIRATORY_TRACT
  Filled 2017-03-06 (×2): qty 3

## 2017-03-06 MED ORDER — IPRATROPIUM BROMIDE 0.02 % IN SOLN
0.5000 mg | Freq: Once | RESPIRATORY_TRACT | Status: AC
  Administered 2017-03-06: 0.5 mg via RESPIRATORY_TRACT
  Filled 2017-03-06: qty 2.5

## 2017-03-06 MED ORDER — MAGNESIUM SULFATE 2 GM/50ML IV SOLN
2.0000 g | Freq: Once | INTRAVENOUS | Status: AC
  Administered 2017-03-06: 2 g via INTRAVENOUS
  Filled 2017-03-06: qty 50

## 2017-03-06 MED ORDER — LAMOTRIGINE 100 MG PO TABS
200.0000 mg | ORAL_TABLET | Freq: Two times a day (BID) | ORAL | Status: DC
  Administered 2017-03-07 – 2017-03-08 (×4): 200 mg via ORAL
  Filled 2017-03-06 (×5): qty 2

## 2017-03-06 MED ORDER — ANASTROZOLE 1 MG PO TABS
1.0000 mg | ORAL_TABLET | Freq: Every day | ORAL | Status: DC
  Administered 2017-03-07 – 2017-03-08 (×2): 1 mg via ORAL
  Filled 2017-03-06 (×2): qty 1

## 2017-03-06 MED ORDER — DEXTROSE 5 % IV SOLN
500.0000 mg | Freq: Once | INTRAVENOUS | Status: AC
  Administered 2017-03-06: 500 mg via INTRAVENOUS
  Filled 2017-03-06: qty 500

## 2017-03-06 MED ORDER — ACETAMINOPHEN 325 MG PO TABS
650.0000 mg | ORAL_TABLET | Freq: Four times a day (QID) | ORAL | Status: DC | PRN
  Administered 2017-03-07: 650 mg via ORAL
  Filled 2017-03-06: qty 2

## 2017-03-06 MED ORDER — IOPAMIDOL (ISOVUE-370) INJECTION 76%
INTRAVENOUS | Status: AC
  Administered 2017-03-06: 100 mL
  Filled 2017-03-06: qty 100

## 2017-03-06 MED ORDER — QUETIAPINE FUMARATE 50 MG PO TABS
100.0000 mg | ORAL_TABLET | Freq: Every day | ORAL | Status: DC
  Administered 2017-03-07 (×2): 100 mg via ORAL
  Filled 2017-03-06 (×2): qty 2
  Filled 2017-03-06: qty 1

## 2017-03-06 MED ORDER — QUETIAPINE FUMARATE 300 MG PO TABS
300.0000 mg | ORAL_TABLET | Freq: Every day | ORAL | Status: DC
  Filled 2017-03-06: qty 1

## 2017-03-06 MED ORDER — DEXTROSE 5 % IV SOLN
1.0000 g | Freq: Once | INTRAVENOUS | Status: AC
  Administered 2017-03-06: 1 g via INTRAVENOUS
  Filled 2017-03-06: qty 10

## 2017-03-06 MED ORDER — SODIUM CHLORIDE 0.9% FLUSH
3.0000 mL | Freq: Two times a day (BID) | INTRAVENOUS | Status: DC
  Administered 2017-03-07 (×2): 3 mL via INTRAVENOUS

## 2017-03-06 MED ORDER — ALBUTEROL (5 MG/ML) CONTINUOUS INHALATION SOLN
10.0000 mg/h | INHALATION_SOLUTION | Freq: Once | RESPIRATORY_TRACT | Status: AC
  Administered 2017-03-06: 10 mg/h via RESPIRATORY_TRACT
  Filled 2017-03-06: qty 20

## 2017-03-06 MED ORDER — RISPERIDONE 3 MG PO TABS
3.0000 mg | ORAL_TABLET | Freq: Every day | ORAL | Status: DC
  Administered 2017-03-07 (×2): 3 mg via ORAL
  Filled 2017-03-06 (×3): qty 1

## 2017-03-06 MED ORDER — METHYLPREDNISOLONE SODIUM SUCC 125 MG IJ SOLR
125.0000 mg | Freq: Once | INTRAMUSCULAR | Status: AC
  Administered 2017-03-06: 125 mg via INTRAVENOUS
  Filled 2017-03-06: qty 2

## 2017-03-06 MED ORDER — SODIUM CHLORIDE 0.9 % IV BOLUS (SEPSIS)
1000.0000 mL | Freq: Once | INTRAVENOUS | Status: AC
  Administered 2017-03-06: 1000 mL via INTRAVENOUS

## 2017-03-06 MED ORDER — ACETAMINOPHEN 650 MG RE SUPP: 650.0000 mg | Freq: Four times a day (QID) | RECTAL | Status: DC | PRN

## 2017-03-06 MED ORDER — IPRATROPIUM BROMIDE 0.02 % IN SOLN
0.5000 mg | Freq: Once | RESPIRATORY_TRACT | Status: AC
  Administered 2017-03-06: 0.5 mg via RESPIRATORY_TRACT

## 2017-03-06 MED ORDER — DEXTROSE 5 % IV SOLN
1.0000 g | INTRAVENOUS | Status: DC
  Filled 2017-03-06: qty 10

## 2017-03-06 NOTE — Progress Notes (Signed)
     Subjective: Chief Complaint  Patient presents with  . Illness     HPI: Kimberly [name] is a 46 y.o. presenting to clinic today for shortness of breath and feeling ill. PMH significant for obesity, mixed bipolar 1, tobacco use. Patient has been seen in clinic for the last 3-4 months due to being ill with recurrent bronchitis. She has had persistent cough, shortness of breath, wheezing. She has had several courses of antibiotics including doxycycline, azithromycin, and most recently Levaquin. She was recently started on Spiriva and albuterol. She states that she has been taking her inhalers and medications but symptoms have persisted. Today patient states that she took all of her medications even ones that other doctors have prescribed and nothing has helped; patient states she did not take medications due to SI but to get better. Called this morning to make appointment.  Patient presented today by car to the clinic for persistent symptoms. She has been feeling unwell. Upon being seen in the clinic patient looked ill and I evaluated her immediately and decided that she needs ED evaluation and admission.  Son who is with patient states mother has been acting different and has been sick. She fell 2 days ago and has not been stable on her feet.   Past Medical, Surgical, Social, and Family History Reviewed & Updated per EMR.    History  Smoking Status  . Current Every Day Smoker  . Packs/day: 1.00  . Years: 32.00  . Types: Cigarettes  Smokeless Tobacco  . Never Used    Comment: 11/26/14 smoking 1 pack every 2 days    Objective: BP 132/60   Pulse (!) 116   Temp 98.4 F (36.9 C) (Axillary)   SpO2 91%  Vitals and nursing notes reviewed  Physical Exam  Constitutional: She has a sickly appearance. She appears distressed.  Cardiovascular: Tachycardia present.   Pulmonary/Chest: Effort normal. She has no wheezes. She has rhonchi.  Neurological: She is alert. She is disoriented. Gait  abnormal.    Assessment/Plan: Ill-appearing 46yo female. Presenting with dyspnea. Patient with AMS, decreased oxygen saturation, and tachycardia. Has had persistent bronchitis without signs of pneumonia for several months despite multiple rounds of treatment. Patient did not go get her CXR at last office visit. Patient states she was compliant with all medications given at last visit.   Due to appearance of patient recommend she go to ED via [place]. While waiting for transportation patient given breathing treatment. In ED, I would recommend full lab workup, chest x-ray, and possible CT chest. Would also recommend that the admitting family medicine team admit her for further workup and observation. Would recommend IV antibiotics, fluids, steroids, and breathing treatments.   Case discussed with Dr. Gwendlyn Deutscher.  Luiz Blare, DO 03/06/2017, 3:14 PM PGY-3, [place]

## 2017-03-06 NOTE — H&P (Signed)
[facility] Admission History and Physical Service Pager: [phone]  Patient name: Kimberly [name] Medical record number: [id number] Date of birth: May 13, 1971 Age: 46 y.o. Gender: female  Primary Care Provider: [name], DO Consultants: None  Code Status: Full   Chief Complaint: SOB   Assessment and Plan: Kimberly [name] is a 46 y.o. female presenting with SOB. PMH is significant for bipolar 1 disorder, T2DM, Tobacco abuse, Anemia   SOB: Patient presenting with SOB, desats in the ED, tachycardic, tachypneic. Patient with no formal diagnosis of COPD, though concerns for this given patient's hx of tobacco abuse (35 pack-years approximately). Furthermore, breast radiation for breast cancer about 2 years which could contribute to lung disease. CTA negative for signs of PE or abnormal lung findings.  CXR with possible opacity noted in lingula on 1 view, though no signs of consolidation noted on CTA make consolidation less likely. CBC without elevated WBC, patient does indicate a hx of fever outpatient, therefore would still consider infection though less likely given imaging. Patient with trace edema noted in her legs, echo in 2015 showing slightly reduce EF of 45-50% and G1DD, BNP 9.0. Given patients exam, less likely concerning for fluid overload/heart failure. Chest pain reproducible/associated with cough therefore unlikely ACS. EKG without ischemic changes, however will check troponin with hx of diffuse hypokinesis noted on previous ECHO. Has received ceftriaxone x 1, Mag x1, Solumedrol x1, CAT x 1. Likely COPD exacerbation will rule out other.  - Attending Dr. Gwendlyn Deutscher, telemetry  - Vitals per floor  - continue pulse oximetry  - Blood culture pending  - Dunebs q4hr prn and albuterol q2 prn  - Continue trending Troponin x3 - AM EKG  - HIV, pertussis, strep, legionella - Continue ceftriaxone and azithromycin for now  - Strict I and Os  - Will check ESR to  help delineate presence of infection - Could consider procalcition - Could consider repeating ECHO  - Outpatient PFTs   Hx of Breast Cancer - Diagnosis with stage 2 breast cancer about 2 1/2 years ago. Received breast radiation and chemotherapy. See most recently by oncology on 4/172/107. Yearly mammogram showed no signs of cancer  - Continue anastrozole   T2DM, A1C 5.3 (2013). Diet controlled  - Obtain A1C   - Will hold of checking CBGs   Hx of Anemia:  Hgb 14.8 today  - does not take iron   Biopolar Disorder: Home Lamictal, Seroquel, and Risperdal  - Continue home meds   Hx of urinary retention. Denies seeing a urologist, thought patient described study where she was tested for - continue prazosin   Hip Pain: Hx of falling on her hip 3 days after jumping from trailer height, Negative hip xray film. No signs of hematoma on examine. Patient likely with some bruising  - tylenol as needed for pain   FEN/GI: Regular Diet  Prophylaxis: Lovenox  Disposition: Home   History of Present Illness:  Kimberly [name] is a 46 y.o. female presenting with SOB. Patient states that since 2022/11/02 following the death of her husband she has been short of breath, with cough, congestion, and wheezing. She has been seen at the [facility] several times. She has received several courses of antibiotics as well as prednisone as an outpatient. She she has received doxycycline, Augmentin, Levaquin. She also states that she has received several doses of prednisone though I have not been able to find these in the chart review of her medications. Patient states  that she has had significant cough with sputum production of yellow, green and in the past Carlino sputum. She indicates some chest pain associated with coughing which is reproducible when she pushes on her chest. She denies any significant history of night sweats. However she does indicate having fevers with a T-max of 103. She does also  endorse some congestion associated with the cough she denies any significant sources of pain other then her hip. She states that she was jumping down from a trailer home that was missing its steps down and she hurt her hip. She does have a history of breast cancer was diagnosed with breast cancer about 2-1/2 years ago; patient did receive radiation for this. She is a active smoker and has smoked for the past 35 years recently she has cut down on the number of cigarettes that she smokes to about 4 cigarettes a day   Review Of Systems: Per HPI with the following additions:   Review of Systems  Constitutional: Positive for fever.  HENT: Positive for congestion. Negative for sore throat.   Eyes: Negative for blurred vision and double vision.  Respiratory: Positive for cough, sputum production, shortness of breath and wheezing.   Cardiovascular: Positive for chest pain. Negative for leg swelling.  Gastrointestinal: Negative for abdominal pain, nausea and vomiting.  Genitourinary: Negative for dysuria, frequency and urgency.  Musculoskeletal: Negative for myalgias and neck pain.  Neurological: Negative for dizziness and headaches.    Patient Active Problem List   Diagnosis Date Noted  . COPD exacerbation ([place]) 03/06/2017  . External hemorrhoid, thrombosed 12/03/2016  . Grieving 11/21/2016  . Mixed incontinence 09/29/2015  . Genetic testing 08/26/2015  . Malignant neoplasm of upper-outer quadrant of left breast in female, estrogen receptor positive ([place]) 05/07/2014  . Disorder of bladder 09/29/2008  . LOW BACK PAIN, CHRONIC 09/29/2008  . OBESITY, MORBID 11/19/2007  . ANEMIA, IRON DEFICIENCY, CHRONIC 11/14/2007  . Mixed bipolar I disorder ([place]) 02/20/2007  . TOBACCO ABUSE 02/20/2007    Past Medical History: Past Medical History:  Diagnosis Date  . Anemia   . Arthritis   . Bipolar 1 disorder ([place])   . Blood transfusion without reported diagnosis 2007   post bleeding childbirth  .  Cancer ([place])    eval  lt breast cancer  . Diabetes mellitus without complication ([place])    gestational  . Heart murmur    as a child-not adult-no cardiac work up  . History of radiation therapy 08/31/14-10/21/14   left breast/ left supraclavicular 50.4 Gy 28 fx, lef tposterior axillary boost 9.52 Gy 28 fx, left rbeast boost/ 10 Gy 5 fx    Past Surgical History: Past Surgical History:  Procedure Laterality Date  . BREAST BIOPSY Left 04/01/14  . BREAST BIOPSY Right 04/19/14  . BREAST LUMPECTOMY WITH AXILLARY LYMPH NODE DISSECTION Bilateral 05/03/14  . DILATION AND CURETTAGE OF UTERUS     after childbirth  . MULTIPLE TOOTH EXTRACTIONS     only 5 left  . PORTACATH PLACEMENT Right 06/18/2014   Procedure: INSERTION PORT-A-CATH;  Surgeon: [name], MD;  Location: [facility];  Service: General;  Laterality: Right;  . TONSILLECTOMY AND ADENOIDECTOMY      Social History: Social History  Substance Use Topics  . Smoking status: Current Every Day Smoker    Packs/day: 1.00    Years: 32.00    Types: Cigarettes  . Smokeless tobacco: Never Used     Comment: 11/26/14 smoking 1 pack every 2 days  .  Alcohol use No   Additional social history: none Please also refer to relevant sections of EMR.  Family History: Family History  Problem Relation Age of Onset  . Heart disease Father   . Cancer Father        Prostate    Allergies and Medications: No Known Allergies No current facility-administered medications on file prior to encounter.    Current Outpatient Prescriptions on File Prior to Encounter  Medication Sig Dispense Refill  . albuterol (PROVENTIL HFA;VENTOLIN HFA) 108 (90 Base) MCG/ACT inhaler Inhale 2 puffs into the lungs every 6 (six) hours as needed for wheezing or shortness of breath. 1 Inhaler 1  . clonazePAM (KLONOPIN) 1 MG tablet Take 1 mg by mouth 3 (three) times daily.     . cyclobenzaprine (FLEXERIL) 10 MG tablet Take 1 tablet (10 mg total) by mouth 3  (three) times daily as needed for muscle spasms. 30 tablet 0  . ferrous sulfate 325 (65 FE) MG tablet Take 1 tablet (325 mg total) by mouth daily with breakfast. 90 tablet 3  . gabapentin (NEURONTIN) 300 MG capsule Take 1 capsule (300 mg total) by mouth 2 (two) times daily. 90 capsule 2  . lamoTRIgine (LAMICTAL) 200 MG tablet Take 1 tablet (200 mg total) by mouth 2 (two) times daily. 28 tablet 0  . polyethylene glycol (MIRALAX) packet Take 17 g by mouth daily. 14 each 0  . prazosin (MINIPRESS) 2 MG capsule Take 2 mg by mouth at bedtime.     . risperiDONE (RISPERDAL) 3 MG tablet Take 1 tablet (3 mg total) by mouth at bedtime. 30 tablet 0    Objective: BP (!) 143/89   Pulse (!) 104   Temp 98.3 F (36.8 C) (Oral)   Resp 17   Ht '5\' 9"'  (1.753 m)   Wt 293 lb (132.9 kg)   SpO2 94%   BMI 43.27 kg/m  Exam: General: Pleasant middle aged female, NAD  Eyes: PERRLA ENTM: Moist mucosa membranes Neck: No lymphadenopathy, no thryomegaly Cardiovascular: RRR, no murmurs  Respiratory: Rhonchi throughout lung fields, no increased WOB, currently on 4 Liters sats 93%  Gastrointestinal: BS+, no distention, no ttp  MSK: Slight trace edema noted to knee, no swelling in legs, no pain with dorsiflexion/planterflexion, no calf tenderness. Right hip slight tenderness, no signs of bruising or erythema  Derm: No rashes, no ulcers Neuro:Strength equal & normal in upper & lower extremities, no changes in sensations Psych: Appropriate   Labs and Imaging: CBC BMET   Recent Labs Lab 03/06/17 1620  WBC 8.4  HGB 14.8  HCT 45.4  PLT 231    Recent Labs Lab 03/06/17 1620  NA 139  K 3.5  CL 103  CO2 28  BUN 7  CREATININE 0.95  GLUCOSE 91  CALCIUM 9.2     Ct Angio Chest Pe W Or Wo Contrast  Result Date: 03/06/2017 CLINICAL DATA:  Shortness of breath wheezing and cough with generalized chest pain EXAM: CT ANGIOGRAPHY CHEST WITH CONTRAST TECHNIQUE: Multidetector CT imaging of the chest was performed  using the standard protocol during bolus administration of intravenous contrast. Multiplanar CT image reconstructions and MIPs were obtained to evaluate the vascular anatomy. CONTRAST:  80 cc Isovue 370 intravenous COMPARISON:  03/06/2017 radiograph, CT chest 10/07/2015 FINDINGS: Cardiovascular: Satisfactory opacification of the pulmonary arteries to the segmental level. No evidence of pulmonary embolism. Normal heart size. No pericardial effusion. Non aneurysmal aorta. No dissection. Mediastinum/Nodes: No enlarged mediastinal, hilar, or axillary lymph nodes. Thyroid gland,  trachea, and esophagus demonstrate no significant findings. Lungs/Pleura: Lungs are clear. No pleural effusion or pneumothorax. Upper Abdomen: No acute abnormality. Musculoskeletal: No chest wall abnormality. No acute or significant osseous findings. Review of the MIP images confirms the above findings. IMPRESSION: Negative for acute pulmonary embolus or aortic dissection. Clear lung fields. Electronically Signed   By: Donavan Foil M.D.   On: 03/06/2017 20:58   Dg Chest Port 1 View  Result Date: 03/06/2017 CLINICAL DATA:  46 year old female with cough and shortness of breath today. Initial encounter. EXAM: PORTABLE CHEST 1 VIEW COMPARISON:  12/13/2016 and earlier. FINDINGS: Portable AP semi upright view at 1618 hours. Chronic right chest subclavian approach porta cath appear stable. Normal lung volumes. Normal cardiac size and mediastinal contours. a questionable a increased opacity in the lingula at the cardiac apex. Otherwise when allowing for portable technique the lungs are clear. No pneumothorax or pleural effusion. Postoperative changes to the left axilla and chest wall. IMPRESSION: Possible abnormal opacity in the lingula. Recommend follow-up PA and lateral chest radiographs. No other acute cardiopulmonary abnormality. Electronically Signed   By: Genevie Ann M.D.   On: 03/06/2017 16:36   Dg Hip Port Unilat W Or Wo Pelvis 1 View  Right  Result Date: 03/06/2017 CLINICAL DATA:  46 year old female status post fall 2 days ago with right hip pain. EXAM: DG HIP (WITH OR WITHOUT PELVIS) 1V PORT RIGHT COMPARISON:  None. FINDINGS: AP portable supine views of the right hip and pelvis. Button artifact projects over the lower lumbar spine. Bone mineralization is within normal limits. Femoral heads are normally located. Hip joint spaces appear symmetric and within normal limits. No pelvis fracture identified. SI joints appear normal. Grossly intact proximal left femur. The proximal right femur appears intact and within normal limits. IMPRESSION: No osseous abnormality identified about the right hip or pelvis. Electronically Signed   By: Genevie Ann M.D.   On: 03/06/2017 16:38   Tonette Bihari, MD 03/06/2017, 11:17 PM PGY-2, [place] Intern pager: [phone], text pages welcome

## 2017-03-06 NOTE — Progress Notes (Signed)
Pharmacy Antibiotic Note  Kimberly [name] is a 46 y.o. female admitted on 03/06/2017 with CAP.  Pharmacy has been consulted for Rocephin dosing.  Plan: Rocephin 1gm IV q24h Pharmacy will sign off - please reconsult if needed  Height: 5\' 9"  (175.3 cm) Weight: 293 lb (132.9 kg) IBW/kg (Calculated) : 66.2  Temp (24hrs), Avg:98.4 F (36.9 C), Min:98.3 F (36.8 C), Max:98.4 F (36.9 C)   Recent Labs Lab 03/06/17 1620  WBC 8.4  CREATININE 0.95    Estimated Creatinine Clearance: 109.7 mL/min (by C-G formula based on SCr of 0.95 mg/dL).    No Known Allergies  Thank you for allowing pharmacy to be a part of this patient's care.  Sherlon Handing, PharmD, BCPS Clinical pharmacist, pager [phone] 03/06/2017 11:28 PM

## 2017-03-06 NOTE — ED Provider Notes (Signed)
[facility]-EMERGENCY DEPT Provider Note   CSN: [id number] Arrival date & time: 03/06/17  1551     History   Chief Complaint Chief Complaint  Patient presents with  . Shortness of Breath    HPI Kimberly [name] is a 46 y.o. female.  The history is provided by the patient and the EMS personnel.  Shortness of Breath  This is a recurrent problem. The problem occurs intermittently.The current episode started more than 2 days ago. The problem has been gradually worsening. Associated symptoms include a fever, cough, sputum production, wheezing and chest pain. Pertinent negatives include no headaches, no coryza, no rhinorrhea, no sore throat, no swollen glands, no ear pain, no neck pain, no hemoptysis, no vomiting, no abdominal pain, no rash and no leg swelling. The problem's precipitants include smoke. She has tried beta-agonist inhalers for the symptoms. The treatment provided mild relief. Associated medical issues include asthma. Associated medical issues do not include PE, CAD, heart failure, past MI or DVT.    Past Medical History:  Diagnosis Date  . Anemia   . Arthritis   . Bipolar 1 disorder (HCC)   . Blood transfusion without reported diagnosis 2007   post bleeding childbirth  . Cancer (HCC)    eval  lt breast cancer  . Diabetes mellitus without complication (HCC)    gestational  . Heart murmur    as a child-not adult-no cardiac work up  . History of radiation therapy 08/31/14-10/21/14   left breast/ left supraclavicular 50.4 Gy 28 fx, lef tposterior axillary boost 9.52 Gy 28 fx, left rbeast boost/ 10 Gy 5 fx    Patient Active Problem List   Diagnosis Date Noted  . External hemorrhoid, thrombosed 12/03/2016  . Grieving 11/21/2016  . Mixed incontinence 09/29/2015  . Genetic testing 08/26/2015  . Malignant neoplasm of upper-outer quadrant of left breast in female, estrogen receptor positive (HCC) 05/07/2014  . Disorder of bladder 09/29/2008  . LOW BACK PAIN, CHRONIC  09/29/2008  . OBESITY, MORBID 11/19/2007  . ANEMIA, IRON DEFICIENCY, CHRONIC 11/14/2007  . Mixed bipolar I disorder (HCC) 02/20/2007  . TOBACCO ABUSE 02/20/2007    Past Surgical History:  Procedure Laterality Date  . BREAST BIOPSY Left 04/01/14  . BREAST BIOPSY Right 04/19/14  . BREAST LUMPECTOMY WITH AXILLARY LYMPH NODE DISSECTION Bilateral 05/03/14  . DILATION AND CURETTAGE OF UTERUS     after childbirth  . MULTIPLE TOOTH EXTRACTIONS     only 5 left  . PORTACATH PLACEMENT Right 06/18/2014   Procedure: INSERTION PORT-A-CATH;  Surgeon: Emelia Loron, MD;  Location: [facility];  Service: General;  Laterality: Right;  . TONSILLECTOMY AND ADENOIDECTOMY      OB History    No data available       Home Medications    Prior to Admission medications   Medication Sig Start Date End Date Taking? Authorizing Provider  albuterol (PROVENTIL HFA;VENTOLIN HFA) 108 (90 Base) MCG/ACT inhaler Inhale 2 puffs into the lungs every 6 (six) hours as needed for wheezing or shortness of breath. 02/25/17   Pincus Large, DO  anastrozole (ARIMIDEX) 1 MG tablet Take 1 tablet (1 mg total) by mouth daily. 07/19/16   Magrinat, Valentino Hue, MD  clonazePAM (KLONOPIN) 1 MG tablet Take 1 mg by mouth 3 (three) times daily.  03/21/14   [provider]  cyclobenzaprine (FLEXERIL) 10 MG tablet Take 1 tablet (10 mg total) by mouth 3 (three) times daily as needed for muscle spasms. 09/04/16   Caryl Ada  Y, DO  docusate sodium (COLACE) 100 MG capsule Take 1 capsule (100 mg total) by mouth 2 (two) times daily. 12/03/16   McKeag, Marylynn Pearson, MD  doxycycline (VIBRA-TABS) 100 MG tablet Take 1 tablet (100 mg total) by mouth 2 (two) times daily. 01/08/17   Rumley, Burna Cash, DO  ferrous sulfate 325 (65 FE) MG tablet Take 1 tablet (325 mg total) by mouth daily with breakfast. 09/29/15   Donnamae Jude, MD  gabapentin (NEURONTIN) 300 MG capsule Take 1 capsule (300 mg total) by mouth 2 (two) times daily. 09/04/16    [name], DO  HYDROcodone-acetaminophen (NORCO/VICODIN) 5-325 MG tablet Take 1 tablet by mouth every 4 (four) hours as needed. 10/23/16   Isla Pence, MD  Hydrocodone-Chlorpheniramine 5-4 MG/5ML SOLN Take 5 mL every 12 hours as needed for coughing 02/25/17   [name], DO  hydrocortisone (ANUSOL-HC) 25 MG suppository Place 1 suppository (25 mg total) rectally 2 (two) times daily. 11/30/16   Vivi Barrack, MD  lamoTRIgine (LAMICTAL) 200 MG tablet Take 1 tablet (200 mg total) by mouth 2 (two) times daily. 07/25/15   Janora Norlander, DO  levofloxacin (LEVAQUIN) 500 MG tablet Take 1 tablet (500 mg total) by mouth daily. 02/25/17   [name], DO  Lidocaine-Hydrocortisone Ace 2-2 % KIT Place 1 application rectally 2 (two) times daily as needed. For hemorrhoidal pain. 12/03/16   McKeag, Marylynn Pearson, MD  naproxen (NAPROSYN) 500 MG tablet Take 1 tablet (500 mg total) by mouth 2 (two) times daily with a meal. 10/21/15   Boelter, Genelle Gather, NP  omeprazole (PRILOSEC) 40 MG capsule Take 1 capsule (40 mg total) by mouth daily. 10/21/15   Boelter, Genelle Gather, NP  polyethylene glycol (MIRALAX) packet Take 17 g by mouth daily. 12/03/16   McKeag, Marylynn Pearson, MD  prazosin (MINIPRESS) 2 MG capsule  07/01/14   [provider]  QUEtiapine (SEROQUEL) 300 MG tablet Take 1 tablet (300 mg total) by mouth at bedtime. 12/07/16   Vivi Barrack, MD  risperiDONE (RISPERDAL) 3 MG tablet Take 1 tablet (3 mg total) by mouth at bedtime. 11/16/16   [name], DO  tiotropium (SPIRIVA) 18 MCG inhalation capsule Place 1 capsule (18 mcg total) into inhaler and inhale daily. 02/25/17   [name], DO    Family History Family History  Problem Relation Age of Onset  . Heart disease Father   . Cancer Father        Prostate    Social History Social History  Substance Use Topics  . Smoking status: Current Every Day Smoker    Packs/day: 1.00    Years: 32.00    Types: Cigarettes  . Smokeless tobacco: Never  Used     Comment: 11/26/14 smoking 1 pack every 2 days  . Alcohol use No     Allergies   Patient has no known allergies.   Review of Systems Review of Systems  Constitutional: Positive for fever. Negative for chills.  HENT: Negative for ear pain, rhinorrhea and sore throat.   Eyes: Negative for pain and visual disturbance.  Respiratory: Positive for cough, sputum production, shortness of breath and wheezing. Negative for hemoptysis.   Cardiovascular: Positive for chest pain. Negative for palpitations and leg swelling.  Gastrointestinal: Negative for abdominal distention, abdominal pain, nausea and vomiting.  Genitourinary: Negative for dysuria and hematuria.  Musculoskeletal: Negative for arthralgias, back pain and neck pain.  Skin: Negative for color change and rash.  Neurological: Negative for seizures, syncope and headaches.  All other systems reviewed and are negative.    Physical Exam Updated Vital Signs  ED Triage Vitals  Enc Vitals Group     BP 03/06/17 1607 129/77     Pulse Rate 03/06/17 1607 (!) 115     Resp 03/06/17 1607 (!) 25     Temp 03/06/17 1607 98.3 F (36.8 C)     Temp Source 03/06/17 1607 Oral     SpO2 03/06/17 1607 (!) 85 %     Weight 03/06/17 1607 293 lb (132.9 kg)     Height 03/06/17 1607 _0  (1.753 m)     Head Circumference --      Peak Flow --      Pain Score 03/06/17 1606 10     Pain Loc --      Pain Edu? --      Excl. in [place]? --     Physical Exam  Constitutional: She is oriented to person, place, and time. She appears distressed.  HENT:  Head: Normocephalic and atraumatic.  Eyes: Conjunctivae are normal. Pupils are equal, round, and reactive to light.  Neck: Normal range of motion. Neck supple.  Cardiovascular: Normal rate, regular rhythm, normal heart sounds and intact distal pulses.   Pulmonary/Chest: She is in respiratory distress. She has wheezes.  Poor effort, diminished throughout  Abdominal: Soft. Bowel sounds are normal. She  exhibits no distension. There is no tenderness.  Musculoskeletal: Normal range of motion. She exhibits edema (trace edema in b/l LE).  Neurological: She is alert and oriented to person, place, and time.  Skin: Skin is warm and dry. Capillary refill takes less than 2 seconds.  Psychiatric: She has a normal mood and affect.  Nursing note and vitals reviewed.    ED Treatments / Results  Labs (all labs ordered are listed, but only abnormal results are displayed) Labs Reviewed  CBC WITH DIFFERENTIAL/PLATELET - Abnormal; Notable for the following:       Result Value   RBC 5.19 (*)    RDW 16.6 (*)    All other components within normal limits  CULTURE, BLOOD (ROUTINE X 2)  CULTURE, BLOOD (ROUTINE X 2)  COMPREHENSIVE METABOLIC PANEL  BRAIN NATRIURETIC PEPTIDE  I-STAT TROPOININ, ED    EKG  EKG Interpretation None       Radiology Ct Angio Chest Pe W Or Wo Contrast  Result Date: 03/06/2017 CLINICAL DATA:  Shortness of breath wheezing and cough with generalized chest pain EXAM: CT ANGIOGRAPHY CHEST WITH CONTRAST TECHNIQUE: Multidetector CT imaging of the chest was performed using the standard protocol during bolus administration of intravenous contrast. Multiplanar CT image reconstructions and MIPs were obtained to evaluate the vascular anatomy. CONTRAST:  80 cc Isovue 370 intravenous COMPARISON:  03/06/2017 radiograph, CT chest 10/07/2015 FINDINGS: Cardiovascular: Satisfactory opacification of the pulmonary arteries to the segmental level. No evidence of pulmonary embolism. Normal heart size. No pericardial effusion. Non aneurysmal aorta. No dissection. Mediastinum/Nodes: No enlarged mediastinal, hilar, or axillary lymph nodes. Thyroid gland, trachea, and esophagus demonstrate no significant findings. Lungs/Pleura: Lungs are clear. No pleural effusion or pneumothorax. Upper Abdomen: No acute abnormality. Musculoskeletal: No chest wall abnormality. No acute or significant osseous findings.  Review of the MIP images confirms the above findings. IMPRESSION: Negative for acute pulmonary embolus or aortic dissection. Clear lung fields. Electronically Signed   By: Donavan Foil M.D.   On: 03/06/2017 20:58   Dg Chest Port 1 View  Result Date: 03/06/2017  CLINICAL DATA:  46 year old female with cough and shortness of breath today. Initial encounter. EXAM: PORTABLE CHEST 1 VIEW COMPARISON:  12/13/2016 and earlier. FINDINGS: Portable AP semi upright view at 1618 hours. Chronic right chest subclavian approach porta cath appear stable. Normal lung volumes. Normal cardiac size and mediastinal contours. a questionable a increased opacity in the lingula at the cardiac apex. Otherwise when allowing for portable technique the lungs are clear. No pneumothorax or pleural effusion. Postoperative changes to the left axilla and chest wall. IMPRESSION: Possible abnormal opacity in the lingula. Recommend follow-up PA and lateral chest radiographs. No other acute cardiopulmonary abnormality. Electronically Signed   By: Genevie Ann M.D.   On: 03/06/2017 16:36   Dg Hip Port Unilat W Or Wo Pelvis 1 View Right  Result Date: 03/06/2017 CLINICAL DATA:  46 year old female status post fall 2 days ago with right hip pain. EXAM: DG HIP (WITH OR WITHOUT PELVIS) 1V PORT RIGHT COMPARISON:  None. FINDINGS: AP portable supine views of the right hip and pelvis. Button artifact projects over the lower lumbar spine. Bone mineralization is within normal limits. Femoral heads are normally located. Hip joint spaces appear symmetric and within normal limits. No pelvis fracture identified. SI joints appear normal. Grossly intact proximal left femur. The proximal right femur appears intact and within normal limits. IMPRESSION: No osseous abnormality identified about the right hip or pelvis. Electronically Signed   By: Genevie Ann M.D.   On: 03/06/2017 16:38    Procedures Procedures (including critical care time)  Medications Ordered in  ED Medications  albuterol (PROVENTIL,VENTOLIN) solution continuous neb (10 mg/hr Nebulization Given 03/06/17 2052)  albuterol (PROVENTIL,VENTOLIN) solution continuous neb (10 mg/hr Nebulization Given 03/06/17 1629)  ipratropium (ATROVENT) nebulizer solution 0.5 mg (0.5 mg Nebulization Given 03/06/17 1630)  methylPREDNISolone sodium succinate (SOLU-MEDROL) 125 mg/2 mL injection 125 mg (125 mg Intravenous Given 03/06/17 1627)  magnesium sulfate IVPB 2 g 50 mL (0 g Intravenous Stopped 03/06/17 1727)  sodium chloride 0.9 % bolus 1,000 mL (1,000 mLs Intravenous New Bag/Given 03/06/17 1628)  cefTRIAXone (ROCEPHIN) 1 g in dextrose 5 % 50 mL IVPB (0 g Intravenous Stopped 03/06/17 1936)  azithromycin (ZITHROMAX) 500 mg in dextrose 5 % 250 mL IVPB (500 mg Intravenous New Bag/Given 03/06/17 1936)  iopamidol (ISOVUE-370) 76 % injection (100 mLs  Contrast Given 03/06/17 2027)     Initial Impression / Assessment and Plan / ED Course  I have reviewed the triage vital signs and the nursing notes.  Pertinent labs & imaging results that were available during my care of the patient were reviewed by me and considered in my medical decision making (see chart for details).     Kimberly [name] is a 46 year old female with history of bipolar disorder, diabetes, asthma who presents to the ED with shortness of breath, wheezing, hypoxia. Patient's vitals at time of arrival to the ED are significant for hypoxia and tachycardia but otherwise are unremarkable. Patient has been seen in her family medicine doctor for multiple pneumonias and asthma exacerbations and was follow-up today when they found her to be hypoxic on room air. Patient does not wear oxygen at baseline. Patient states that she has felt sick for several months and has been on and off antibiotics during that time as well as steroids. Patient with worsening symptoms over the last several days. Patient was given breathing treatment at primary care provider's office and  sent for evaluation. She denies chest pain, shortness of breath. Patient had EKG performed that showed  sinus tachycardia with no signs of ischemia and normal intervals. Exam is positive for diffuse wheezing and poor inspiratory effort. Patient with no obvious leg swelling. Concern for worsening COPD versus asthma exacerbation and patient started on albuterol and Atrovent and given IV Solu-Medrol and IV magnesium and fluid bolus. Patient with CBC, CMP, troponin, BNP, chest x-ray ordered. Patient with chest x-ray showing possible opacity and patient with blood cultures collected and started on IV azithromycin and IV Rocephin. Patient with right hip pain after fall two days ago but normal xray with no fracture or malalignment.  Patient otherwise with unremarkable labs. No signs of anemia, no leukocytosis. Patient with normal BNP and no signs of volume overload on chest x-ray. Patient with some improvement following breathing treatment but with continued wheezing and was started on another continuous albuterol. CT angiography was ordered to further evaluate for PE versus pneumonia.   CT angiography of chest is without pulmonary embolism or pneumonia. Patient likely with COPD exacerbation with new oxygen requirement. Patient requiring 3 L of oxygen to maintain her saturations. Patient admitted to family medicine service for further care.  Final Clinical Impressions(s) / ED Diagnoses   Final diagnoses:  SOB (shortness of breath)  COPD exacerbation [facility])    New Prescriptions New Prescriptions   No medications on file     Lennice Sites, DO 03/06/17 2311    Gareth Morgan, MD 03/08/17 1256

## 2017-03-06 NOTE — ED Triage Notes (Signed)
Pt to ER for worsening shortness of breath over the past three weeks. States recently was seen by PCP and given antibiotics and steroids. Went to [facility] today for acute respiratory distress and essentially sent here for further treatment. Was given albuterol breathing treatment prior to departure from UC. Pt RA sats 85%, labored respirations noted, pt is however alert and oriented. Able to speak in one word fragments.

## 2017-03-07 ENCOUNTER — Encounter (HOSPITAL_COMMUNITY): Payer: Self-pay

## 2017-03-07 DIAGNOSIS — R0602 Shortness of breath: Secondary | ICD-10-CM

## 2017-03-07 DIAGNOSIS — R0600 Dyspnea, unspecified: Secondary | ICD-10-CM

## 2017-03-07 DIAGNOSIS — R000 Tachycardia, unspecified: Secondary | ICD-10-CM

## 2017-03-07 DIAGNOSIS — R05 Cough: Secondary | ICD-10-CM

## 2017-03-07 DIAGNOSIS — R059 Cough, unspecified: Secondary | ICD-10-CM

## 2017-03-07 DIAGNOSIS — R0609 Other forms of dyspnea: Secondary | ICD-10-CM

## 2017-03-07 DIAGNOSIS — W19XXXA Unspecified fall, initial encounter: Secondary | ICD-10-CM

## 2017-03-07 LAB — COMPREHENSIVE METABOLIC PANEL
ALT: 19 U/L (ref 14–54)
AST: 24 U/L (ref 15–41)
Albumin: 3.6 g/dL (ref 3.5–5.0)
Alkaline Phosphatase: 58 U/L (ref 38–126)
Anion gap: 10 (ref 5–15)
BUN: 7 mg/dL (ref 6–20)
CO2: 24 mmol/L (ref 22–32)
Calcium: 9.2 mg/dL (ref 8.9–10.3)
Chloride: 104 mmol/L (ref 101–111)
Creatinine, Ser: 0.89 mg/dL (ref 0.44–1.00)
GFR calc Af Amer: >60 mL/min (ref >60)
GFR calc non Af Amer: >60 mL/min (ref >60)
Glucose, Bld: 146 mg/dL — ABNORMAL HIGH (ref 65–99)
Potassium: 3.9 mmol/L (ref 3.5–5.1)
Sodium: 138 mmol/L (ref 135–145)
Total Bilirubin: 0.4 mg/dL (ref 0.3–1.2)
Total Protein: 6.7 g/dL (ref 6.5–8.1)

## 2017-03-07 LAB — CBC
HCT: 42.6 % (ref 36.0–46.0)
Hemoglobin: 13.8 g/dL (ref 12.0–15.0)
MCH: 28.2 pg (ref 26.0–34.0)
MCHC: 32.4 g/dL (ref 30.0–36.0)
MCV: 86.9 fL (ref 78.0–100.0)
Platelets: 243 10*3/uL (ref 150–400)
RBC: 4.9 MIL/uL (ref 3.87–5.11)
RDW: 16.6 % — ABNORMAL HIGH (ref 11.5–15.5)
WBC: 9.2 10*3/uL (ref 4.0–10.5)

## 2017-03-07 LAB — GLUCOSE, CAPILLARY: Glucose-Capillary: 139 mg/dL — ABNORMAL HIGH (ref 65–99)

## 2017-03-07 LAB — URINALYSIS, ROUTINE W REFLEX MICROSCOPIC
Bilirubin Urine: NEGATIVE
Glucose, UA: NEGATIVE mg/dL
Hgb urine dipstick: NEGATIVE
Ketones, ur: NEGATIVE mg/dL
Leukocytes, UA: NEGATIVE
Nitrite: NEGATIVE
Protein, ur: NEGATIVE mg/dL
Specific Gravity, Urine: 1.016 (ref 1.005–1.030)
pH: 7 (ref 5.0–8.0)

## 2017-03-07 LAB — HIV ANTIBODY (ROUTINE TESTING W REFLEX): HIV Screen 4th Generation wRfx: NONREACTIVE

## 2017-03-07 LAB — TROPONIN I
Troponin I: <0.03 ng/mL (ref <0.03)
Troponin I: <0.03 ng/mL (ref <0.03)
Troponin I: <0.03 ng/mL (ref <0.03)

## 2017-03-07 LAB — HCG, QUANTITATIVE, PREGNANCY: hCG, Beta Chain, Quant, S: 1 m[IU]/mL (ref <5)

## 2017-03-07 LAB — STREP PNEUMONIAE URINARY ANTIGEN: Strep Pneumo Urinary Antigen: NEGATIVE

## 2017-03-07 LAB — TSH: TSH: 1.629 u[IU]/mL (ref 0.350–4.500)

## 2017-03-07 LAB — SEDIMENTATION RATE: Sed Rate: 2 mm/hr (ref 0–22)

## 2017-03-07 MED ORDER — DM-GUAIFENESIN ER 30-600 MG PO TB12
1.0000 | ORAL_TABLET | Freq: Two times a day (BID) | ORAL | Status: DC
  Administered 2017-03-07 – 2017-03-08 (×2): 1 via ORAL
  Filled 2017-03-07 (×3): qty 1

## 2017-03-07 MED ORDER — DOXYCYCLINE HYCLATE 100 MG PO TABS
100.0000 mg | ORAL_TABLET | Freq: Two times a day (BID) | ORAL | Status: DC
  Administered 2017-03-07 – 2017-03-08 (×3): 100 mg via ORAL
  Filled 2017-03-07 (×3): qty 1

## 2017-03-07 MED ORDER — TIOTROPIUM BROMIDE MONOHYDRATE 18 MCG IN CAPS: 18.0000 ug | ORAL_CAPSULE | Freq: Every day | RESPIRATORY_TRACT | Status: DC

## 2017-03-07 MED ORDER — PNEUMOCOCCAL VAC POLYVALENT 25 MCG/0.5ML IJ INJ
0.5000 mL | INJECTION | INTRAMUSCULAR | Status: AC
  Administered 2017-03-08: 0.5 mL via INTRAMUSCULAR
  Filled 2017-03-07: qty 0.5

## 2017-03-07 MED ORDER — IPRATROPIUM-ALBUTEROL 0.5-2.5 (3) MG/3ML IN SOLN
3.0000 mL | RESPIRATORY_TRACT | Status: DC
  Administered 2017-03-07 – 2017-03-08 (×7): 3 mL via RESPIRATORY_TRACT
  Filled 2017-03-07 (×8): qty 3

## 2017-03-07 NOTE — Progress Notes (Signed)
Pt refused her 16:00 neb tx. PT stated her breathing is fine right now. No distress noted.

## 2017-03-07 NOTE — Progress Notes (Signed)
Family Medicine Teaching Service Daily Progress Note Intern Pager: [phone]  Patient name: Kimberly [name] Medical record number: [id number] Date of birth: [birth year] Age: 46 y.o. Gender: female  Primary Care Provider: [name], DO Consultants: None Code Status: Full Code  Pt Overview and Major Events to Date:  Husband passed away months ago and has had SOB and cough symptoms since that time. Treated multiple times for COPD exacerbation. Started on albuterol and spiriva, but has not been using spiriva due to cost. CXR with opacity in lingula, no signs of such on CTA. 35pack/year smoking history. Desatting and now on 2L O2, not on oxygen at baseline.  Assessment and Plan: Kimberly [name] is a 46 y.o. female presenting with SOB. PMH is significant for bipolar 1 disorder, T2DM, Tobacco abuse, Anemia, history of breast cancer s/p radiation and chemo.   Dyspnea: Improved but still on 3 L by [place].  Likely due to COPD exacerbation. 35-pack-year smoking history.  CTA negative for PE or abnormal lung findings.  Unlikely CHF with BNP 9.0. No signs of fluid overload. ACS ruled out by serial troponin and EKG. 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 No signs of fluid overload. BNP 9.0.  -Repeat echo   Hx of Breast Cstatus post lumpectomy and lymph node dissection, radiation and partial chemotherapy with (1 round of cyclophosphamide and Docetaxel). See most recently by oncology on 02/06/16. Yearly mammogram showed no signs of cancer  - Continue anastrozole   T2DM, A1C 5.3 (2013). Diet controlled  - Obtain A1C   - Will  hold off checking CBGs   Hx of Anemia:  Hgb 14.8 on admission. 13.8 today - does not take iron   Biopolar Disorder: Home Lamictal, Seroquel, and Risperdal  - Continue home meds   Hx of urinary retention. ? seen urology in the past  - continue prazosin  - UA  - Monitor urine output   Right Hip Pain: stable. hx of fall on her hip 3 days after jumping from trailer height, Negative hip xray film. No signs of hematoma on examine. Patient likely with some bruising  - tylenol as needed for pain   History of tobacco use: 35 year pack history. Recently cut down to 4 cigarettes a day. -We'll offer nicotine patch as needed  FEN/GI: Regular Diet   Prophylaxis: Lovenox  Disposition: inpatient pending medical improvement  Subjective:  Kimberly [name] is eating breakfast and very pleasant. She talks about her goal of being able to breathe on her own. She mentions wanting to quit smoking and having recently joined a gym where she was told to rest due to ongoing coughing with any activity. She is interested in treatment but hopes she can afford it.   Objective: Temp:  [98 F (36.7 C)-98.4 F (36.9 C)] 98 F (36.7 C) (05/17 7622) Pulse Rate:  [97-116] 104 (05/17 0608) Resp:  [15-33] 17 (05/17 6333) BP: (129-157)/(60-94) 132/73 (05/17 0608) SpO2:  [85 %-97 %] 93 % (05/17 0608) Weight:  [293 lb (132.9 kg)-297 lb 9.9 oz (135 kg)] 297 lb 9.9 oz (135 kg) (05/16 2358) Physical Exam: General:  Stable appearing woman sitting in bed eating breakfast with nasal cannula on Cardiovascular: RRR, no murmurs Respiratory:  no increased work of breathing, rhoncorous throughout, clearing some whitish phlegm. Didn't appreciate wheeze or crackles Abdomen: Normoactive bowel sounds. Non distended.  Tender to palpation in upper quadrants (this extends over ribs and is likely due to coughing) Extremities: No edema.   Laboratory:  Recent Labs Lab 03/06/17 1620 03/07/17 0453  WBC 8.4 9.2  HGB 14.8 13.8  HCT  45.4 42.6  PLT 231 243    Recent Labs Lab 03/06/17 1620 03/07/17 0453  NA 139 138  K 3.5 3.9  CL 103 104  CO2 28 24  BUN 7 7  CREATININE 0.95 0.89  CALCIUM 9.2 9.2  PROT 6.9 6.7  BILITOT 0.6 0.4  ALKPHOS 57 58  ALT 21 19  AST 24 24  GLUCOSE 91 146*    Imaging/Diagnostic Tests:  DG HIP unilateral IMPRESSION: No osseous abnormality identified about the right hip or pelvis.  CXR FINDINGS: Portable AP semi upright view at 1618 hours. Chronic right chest subclavian approach porta cath appear stable. Normal lung volumes. Normal cardiac size and mediastinal contours. a questionable a increased opacity in the lingula at the cardiac apex. Otherwise when allowing for portable technique the lungs are clear. No pneumothorax or pleural effusion. Postoperative changes to the left axilla and chest wall.  IMPRESSION: Possible abnormal opacity in the lingula. Recommend follow-up PA and lateral chest radiographs. No other acute cardiopulmonary abnormality.  CT Angiogram IMPRESSION: Negative for acute pulmonary embolus or aortic dissection. Clear lung fields.  Karl Pock, Medical Student 03/07/2017, 8:25 AM MS4, [place] Intern pager: [phone], text pages welcome  I have seen and evaluated the patient with Student Dr. Karrie Meres I am in agreement with the note above in its revised form.  Wendee Beavers, MD, PGY-2 03/07/2017 5:21 PM

## 2017-03-07 NOTE — Discharge Summary (Signed)
Kimberly [name] Discharge Summary  Patient name: Kimberly [name] Medical record number: [id number] Date of birth: 15-Jun-1971 Age: 46 y.o. Gender: female Date of Admission: 03/06/2017  Date of Discharge: 03/08/17  Admitting Physician: Kinnie Feil, MD  Primary Care Provider: [name], DO Consultants: None  Indication for Hospitalization: Hypoxia  Discharge Diagnoses/Problem List:  COPD exacerbation Asthma Bipolar Type 1 History of Gestational Diabetes HFrEF (ECHO 2015 45-50%) History of breast cancer s/p lumpectomy, LND, radiation, partial chemo Tobacco use  Disposition: Discharge to home.   Discharge Condition: Stable. Oxygenating well on room air.   Discharge Exam:  General: Well appearing, no distress Cardiovascular: RRR, no murmurs Respiratory: no increased work of breathing,no rhonchi. Mild expiratory wheezes heard in upper right lobe. No coughing with inspiration. Abdomen: Normoactive bowel sounds. Non distended.  Extremities: No edema.   Brief [name] Course:  Kimberly [name] is a 46 yo with PMH of asthma, 35 pack year smoking history, breast cancer in 2014 treated with chemotherapy and radiation, gestational DM, HFrEF (2015 ECHO 45-50%) and Bipolar disorder who was sent from clinic with cough and acute worsening shortness of breath and hypoxia. She was seen recently at her PCP for similar issues and was treated with Augmentin and Levaquin then she presented to clinic looking dyspneic and found to be satting in the 70s-80s. She was very rhonchorous and unable to inspire deeply without profuse coughing and sputum production. In the ED, CXR and CT chest were negative for signs of PE and pneumonia. EKG without ischemic changes, and troponins were negative x3. She was treated initially with Ceftriaxone, azithromycin, magnesium, Solumedrol, continuous albuterol treatment for likely COPD exacerbation. She was on 3L O2.  Infectious etiology  was considered and HIV, Strep pneumo were negatve. Legionella and Pertussis were low on the differential and had not resulted by the time of discharge. CHF exacerbation was considered too, but no signs of fluid overload and BNP 9. During admission she was treated with prednisone, scheduled duonebs and albuterol, mucinex and antibiotics transitioned to Doxycycline on 5/17 to be treated for 7 days total (end on 5/21). Her exam improved over 24 hours and she was transitioned to room air where she continued to saturate well (97%). ECHO was done on day of discharge ----Spiriva was started and duonebs and albuterol were no longer scheduled. Ms Krzywicki was clear that she wants to stop smoking, she is down to 4 cigarettes a day. She also mentioned that her house is under construction and black mold has been found. This is concerning for renewal of her symptoms upon going home if symptoms were caused by airborne irritants.  Issues for Follow Up:  1. Follow up legionella and pertussis results 2. Please refer to Dr. Valentina Lucks for smoking cessation 3. Pulmonary Function Test out patient, currently appointment with Dr. Melvyn Novas  4. Please follow up on ECHO results 5. Make sure patient completed doxycycline and prednisone course  6. Discuss medication options with patient - patient has to meet her deductible, can not currently afford Spiriva. No options available per case management - sent patient home with Spiriva inhaler and nebulizer from [name] - will need to discuss next steps with patient - possibly send to Dr. Valentina Lucks for medication management    Significant Procedures: none  Significant Labs and Imaging:   Recent Labs Lab 03/06/17 1620 03/07/17 0453 03/08/17 1127  WBC 8.4 9.2 11.7*  HGB 14.8 13.8 13.5  HCT 45.4 42.6 41.9  PLT 231 243 223  Recent Labs Lab 03/06/17 1620 03/07/17 0453 03/08/17 1127  NA 139 138 140  K 3.5 3.9 3.9  CL 103 104 106  CO2 28 24 23   GLUCOSE 91 146* 105*  BUN 7 7 7    CREATININE 0.95 0.89 0.88  CALCIUM 9.2 9.2 9.4  ALKPHOS 57 58  --   AST 24 24  --   ALT 21 19  --   ALBUMIN 3.9 3.6  --     ECHO: pending   Results/Tests Pending at Time of Discharge: Legionella, Pertussis  Discharge Medications:  Allergies as of 03/08/2017   No Known Allergies     Medication List    TAKE these medications   albuterol 108 (90 Base) MCG/ACT inhaler Commonly known as:  PROVENTIL HFA;VENTOLIN HFA Inhale 2 puffs into the lungs every 6 (six) hours as needed for wheezing or shortness of breath.   clonazePAM 1 MG tablet Commonly known as:  KLONOPIN Take 1 mg by mouth 3 (three) times daily.   cyclobenzaprine 10 MG tablet Commonly known as:  FLEXERIL Take 1 tablet (10 mg total) by mouth 3 (three) times daily as needed for muscle spasms.   doxycycline 100 MG tablet Commonly known as:  VIBRA-TABS Take 1 tablet (100 mg total) by mouth every 12 (twelve) hours.   ferrous sulfate 325 (65 FE) MG tablet Take 1 tablet (325 mg total) by mouth daily with breakfast.   gabapentin 300 MG capsule Commonly known as:  NEURONTIN Take 1 capsule (300 mg total) by mouth 2 (two) times daily.   ipratropium-albuterol 0.5-2.5 (3) MG/3ML Soln Commonly known as:  DUONEB Take 3 mLs by nebulization every 4 (four) hours as needed.   lamoTRIgine 200 MG tablet Commonly known as:  LAMICTAL Take 1 tablet (200 mg total) by mouth 2 (two) times daily.   polyethylene glycol packet Commonly known as:  MIRALAX Take 17 g by mouth daily.   prazosin 2 MG capsule Commonly known as:  MINIPRESS Take 2 mg by mouth at bedtime.   predniSONE 20 MG tablet Commonly known as:  DELTASONE Take 2 tablets (40 mg total) by mouth daily with breakfast. Start taking on:  03/09/2017   QUEtiapine 100 MG tablet Commonly known as:  SEROQUEL Take 100 mg by mouth at bedtime.   risperiDONE 3 MG tablet Commonly known as:  RISPERDAL Take 1 tablet (3 mg total) by mouth at bedtime.   tiotropium 18 MCG  inhalation capsule Commonly known as:  SPIRIVA Place 1 capsule (18 mcg total) into inhaler and inhale daily. Start taking on:  03/09/2017            Durable Medical Equipment        Start     Ordered   03/08/17 1348  For home use only DME Nebulizer/meds  Once    Question:  Patient needs a nebulizer to treat with the following condition  Answer:  COPD (chronic obstructive pulmonary disease) ([place])   03/08/17 1347   03/08/17 0000  DME Nebulizer/meds    Question:  Patient needs a nebulizer to treat with the following condition  Answer:  COPD (chronic obstructive pulmonary disease) ([place])   03/08/17 1408      Discharge Instructions: Please refer to Patient Instructions section of EMR for full details.  Patient was counseled important signs and symptoms that should prompt return to medical care, changes in medications, dietary instructions, activity restrictions, and follow up appointments.   Follow-Up Appointments: Follow-up Information    [name], DO.  Go on 03/14/2017.   Specialty:  Family Medicine Why:  Please go to your appointment @ 2:30 PM Contact information: [address] [place] [address] [phone]           Tonette Bihari, MD 03/08/2017, 2:38 PM PGY2, [place]

## 2017-03-08 ENCOUNTER — Inpatient Hospital Stay (HOSPITAL_COMMUNITY): Payer: Medicare PPO

## 2017-03-08 DIAGNOSIS — I350 Nonrheumatic aortic (valve) stenosis: Secondary | ICD-10-CM

## 2017-03-08 LAB — ECHOCARDIOGRAM COMPLETE
Height: 69 in
Weight: 4761.94 oz

## 2017-03-08 LAB — BASIC METABOLIC PANEL
Anion gap: 11 (ref 5–15)
BUN: 7 mg/dL (ref 6–20)
CO2: 23 mmol/L (ref 22–32)
Calcium: 9.4 mg/dL (ref 8.9–10.3)
Chloride: 106 mmol/L (ref 101–111)
Creatinine, Ser: 0.88 mg/dL (ref 0.44–1.00)
GFR calc Af Amer: >60 mL/min (ref >60)
GFR calc non Af Amer: >60 mL/min (ref >60)
Glucose, Bld: 105 mg/dL — ABNORMAL HIGH (ref 65–99)
Potassium: 3.9 mmol/L (ref 3.5–5.1)
Sodium: 140 mmol/L (ref 135–145)

## 2017-03-08 LAB — CBC
HCT: 41.9 % (ref 36.0–46.0)
Hemoglobin: 13.5 g/dL (ref 12.0–15.0)
MCH: 28.6 pg (ref 26.0–34.0)
MCHC: 32.2 g/dL (ref 30.0–36.0)
MCV: 88.8 fL (ref 78.0–100.0)
Platelets: 223 10*3/uL (ref 150–400)
RBC: 4.72 MIL/uL (ref 3.87–5.11)
RDW: 17 % — ABNORMAL HIGH (ref 11.5–15.5)
WBC: 11.7 10*3/uL — ABNORMAL HIGH (ref 4.0–10.5)

## 2017-03-08 LAB — HEMOGLOBIN A1C
Hgb A1c MFr Bld: 5.5 % (ref 4.8–5.6)
Mean Plasma Glucose: 111 mg/dL

## 2017-03-08 LAB — GLUCOSE, CAPILLARY: Glucose-Capillary: 126 mg/dL — ABNORMAL HIGH (ref 65–99)

## 2017-03-08 LAB — LEGIONELLA PNEUMOPHILA SEROGP 1 UR AG: L. pneumophila Serogp 1 Ur Ag: NEGATIVE

## 2017-03-08 MED ORDER — PREDNISONE 20 MG PO TABS: 40.0000 mg | ORAL_TABLET | Freq: Every day | ORAL | 0 refills | Status: DC

## 2017-03-08 MED ORDER — TIOTROPIUM BROMIDE MONOHYDRATE 18 MCG IN CAPS: 18.0000 ug | ORAL_CAPSULE | Freq: Every day | RESPIRATORY_TRACT | 12 refills | Status: DC

## 2017-03-08 MED ORDER — PREDNISONE 20 MG PO TABS: 40.0000 mg | ORAL_TABLET | Freq: Every day | ORAL | 0 refills | Status: AC

## 2017-03-08 MED ORDER — TIOTROPIUM BROMIDE MONOHYDRATE 18 MCG IN CAPS
18.0000 ug | ORAL_CAPSULE | Freq: Every day | RESPIRATORY_TRACT | Status: DC
  Administered 2017-03-08: 18 ug via RESPIRATORY_TRACT
  Filled 2017-03-08 (×2): qty 5

## 2017-03-08 MED ORDER — DOXYCYCLINE HYCLATE 100 MG PO TABS: 100.0000 mg | ORAL_TABLET | Freq: Two times a day (BID) | ORAL | 0 refills | Status: DC

## 2017-03-08 MED ORDER — IPRATROPIUM-ALBUTEROL 0.5-2.5 (3) MG/3ML IN SOLN: 3.0000 mL | RESPIRATORY_TRACT | 0 refills | Status: DC | PRN

## 2017-03-08 MED ORDER — ANASTROZOLE 1 MG PO TABS: 1.0000 mg | ORAL_TABLET | Freq: Every day | ORAL | Status: DC

## 2017-03-08 MED FILL — IPRAT-ALBUT 0.5-3(2.5) MG/3: 0.5-2.5 (3) | 30 days supply | Qty: 360 | Fill #0

## 2017-03-08 MED FILL — DOXYCYCLINE HYCLATE 100 MG: 100 | 5 days supply | Qty: 10 | Fill #0

## 2017-03-08 MED FILL — predniSONE 20 MG TABS: 20 | 3 days supply | Qty: 6 | Fill #0

## 2017-03-08 NOTE — Progress Notes (Signed)
Pt walking on room- O2 saturation stayed at 96-98%.  No SOB.   Heart rate 120-130 when walking.   Paulla Fore, RN

## 2017-03-08 NOTE — Care Management Note (Addendum)
Case Management Note  Patient Details  Name: Kimberly [name] MRN: [id number] Date of Birth: 1971/01/22  Subjective/Objective:                 Spoke with patient at the bedside. She states that she is having a hard time affording Spiriva. She states that it costs over $300. She states that she was told it will until she meets her deductible. CM looked into Assistance coupon from manufacturer however with her medicare coverage she is not able to use the coupon. Please label Spiriva inhaler for home use so it can be sent home with her if she is to continue use. She states that she would need a nebulizer if she was discharged with medications requiring one.  Verified Spiriva cost: COVER- YES  CO-PAY- 264.96  TIER- 3 DRUG  PRIOR APPROVAL- NO  PREFERRED PHARMACY : [facility]   Nebulizer to be delivered to room prior to DC. Pt provided with patient assistance applocation through [place]. Patient understands to take it to PCP to them fill it out.   Action/Plan:  CM will continue to follow.  Expected Discharge Date:                  Expected Discharge Plan:  Home/Self Care  In-House Referral:     Discharge planning Services  CM Consult, Medication Assistance  Post Acute Care Choice:    Choice offered to:     DME Arranged:    DME Agency:     HH Arranged:    HH Agency:     Status of Service:  In process, will continue to follow  If discussed at Long Length of Stay Meetings, dates discussed:    Additional Comments:  Carles Collet, RN 03/08/2017, 12:00 PM

## 2017-03-08 NOTE — Progress Notes (Signed)
O2 saturation at 97% on RA.    Will continue to monitor   Paulla Fore, RN

## 2017-03-08 NOTE — Progress Notes (Signed)
Discharge instructions (including medications) discussed with and copy provided to patient/caregiver 

## 2017-03-08 NOTE — Progress Notes (Signed)
Transitions of Care Pharmacy Note  Plan:  Educated on new doxcycline and prednisone. Explained administration of Spiriva including how to appropriately use the inhaler.  Addressed concerns regarding cost of Spiriva. Explained to patient that she would be enrolled in a program to help her afford her copay.   --------------------------------------------- Kimberly [name] is an 46 y.o. female who presents with a chief complaint of a COPD exacerbation.  In anticipation of discharge, pharmacy has reviewed this patient's prior to admission medication history, as well as current inpatient medications listed per the [facility].  Current medication indications, dosing, frequency, and notable side effects reviewed with patient. patient verbalized understanding of current inpatient medication regimen and is aware that the After Visit Summary when presented, will represent the most accurate medication list at discharge.   Kimberly [name] expressed concerns regarding the cost of her Spiriva. Patient has been provided with samples and will be enrolled in a program through her physician's office.   Assessment: Understanding of regimen: good Understanding of indications: good Potential of compliance: good Barriers to Obtaining Medications: Yes- Spiriva copay  Patient instructed to contact inpatient pharmacy team with further questions or concerns if needed.    Time spent preparing for discharge counseling: 10 minutes  Time spent counseling patient: 15 minutes    Thank you for allowing pharmacy to be a part of this patient's care.  Ihor Austin, PharmD PGY1 Pharmacy Resident Pager: [phone]

## 2017-03-08 NOTE — Progress Notes (Signed)
Family Medicine Teaching Service Daily Progress Note Intern Pager: [phone]  Patient name: Kimberly [name] Medical record number: [id number] Date of birth: [birth year] Age: 46 y.o. Gender: female  Primary Care Provider: [name], DO Consultants: None Code Status: FULL CODE  Pt Overview and Major Events to Date:  Husband passed away months ago and has had SOB and cough symptoms since that time. Treated multiple times for COPD exacerbation. Started on albuterol and spiriva, but has not been using spiriva due to cost. CXR with opacity in lingula, no signs of such on CTA. 35pack/year smoking history. Desatting and put on 2L O2. Not on O2 at baseline. Clear lungs this AM, now on room air satting 97%  Assessment and Plan: Kimberly Brownis a 46 y.o.femalepresenting with SOB. PMH is significant for bipolar 1 disorder, T2DM, Tobacco abuse, Anemia, history of breast cancer s/p radiation and chemo.   Dyspnea: Much improved, now on RA.  Likely due to COPD exacerbation. 35-pack-year smoking history.  CTA negative for PE or abnormal lung findings.  Unlikely CHF with BNP 9.0. No signs of fluid overload. ACS ruled out by serial troponin and EKG. Received ceftriaxone, magnesium and Solu-Medrol once in ED. Duonebs and albuterol scheduled yesterday. Today clear to auscultation, no rhonchi, mild expiratory wheezes in upper lobes. Turned O2 down and tolerating RA. Satting 97% on ra after walking. Strep and HIV negative. On Doxy.  - Vitals per floor  - Continue pulse oximetry  - Blood culture pending  - change Duonebs and albuterol to PRN - prednisone 40mg , plan taper - start Spiriva today to be continued out pt - pneumovax 23 today - Mucinex  - pertussis, legionella - Outpatient PFTs and sleep study - Formal nurse walk test  HFmrEF: Echo in 2015 with ejection fraction of 45-50% and G1DD. No signs of fluid overload. BNP 9.0.  -Repeat echo if possible before discharge, at follow up fine too.    Hx of Breast Cstatus post lumpectomy and lymph node dissection, radiation and partial chemotherapy with (1 round of cyclophosphamide and Docetaxel). See most recently by oncology on 02/06/16. Yearly mammogram showed no signs of cancer  - Continue anastrozole   History of GDM - no history of T2DM, A1C 5.5 now. Diet controlled  - Will hold off checking CBGs   Hx of Anemia: Hgb 14.8 on admission. 13.8 today - does not take iron   Biopolar Disorder: Home Lamictal, Seroquel, and Risperdal  - Continue home meds   Hx of urinary retention. ? seen urology in the past  - continue prazosin  - UA  - Monitor urine output   Right Hip Pain: stable.hx of fall on her hip 3 days after jumping from trailer height, Negative hip xray film. No signs of hematoma on examine. Patient likely with some bruising  - tylenol as needed for pain   History of tobacco use: 35 year pack history. Recently cut down to 4 cigarettes a day. States she really wants to stop smoking forever. -We'll offer nicotine patch as needed - Smoking cessation team consult  FEN/GI: Regular Diet   Prophylaxis: Lovenox  Disposition: Watch today, discharge tomorrow   Subjective:  Reporting feeling better than she has since last December. Denies wheezing or sputum production. States that she has been having construction in her home and they have found black mold and she thinks this may have something to do with her acute worsening.  Objective: Temp:  [97.7 F (36.5 C)-98.4 F (36.9 C)] 97.7 F (36.5 C) (05/18  0435) Pulse Rate:  [88-109] 101 (05/18 0435) Resp:  [17-20] 18 (05/18 0435) BP: (124-160)/(75-92) 129/83 (05/18 0435) SpO2:  [92 %-99 %] 94 % (05/18 0752) Physical Exam: General: Well appearing, no distress Cardiovascular: RRR, no murmurs Respiratory:  no increased work of breathing, no rhonchi. Mild expiratory wheezes heard in upper right lobe. No coughing with inspiration. Abdomen: Normoactive bowel sounds.  Non distended.   Extremities: No edema.   Laboratory:  Recent Labs Lab 03/06/17 1620 03/07/17 0453  WBC 8.4 9.2  HGB 14.8 13.8  HCT 45.4 42.6  PLT 231 243    Recent Labs Lab 03/06/17 1620 03/07/17 0453  NA 139 138  K 3.5 3.9  CL 103 104  CO2 28 24  BUN 7 7  CREATININE 0.95 0.89  CALCIUM 9.2 9.2  PROT 6.9 6.7  BILITOT 0.6 0.4  ALKPHOS 57 58  ALT 21 19  AST 24 24  GLUCOSE 91 146*     Kimberly [name], Medical Student 03/08/2017, 8:47 AM MS4, [place] Intern pager: [phone], text pages welcome  ________________________________________________________________________ I agree with the medical student's note above, with notable exceptions in the assessment and plan which I have given below.   Kimberly [name] is a 46 y.o., here with presenting with SOB. PMH is significant for bipolar 1 disorder, T2DM, Tobacco abuse, Anemia, history of breast cancer s/p radiation and chemo.    Physical Exam Gen: Well appearing, NAD  Pulm: No increased WOB, intermittent expiratory wheezes, CV: RRR, no murmurs  Abd: BS+, no ttp, no distention   Plan:   Kimberly Brownis a 46 y.o.femalepresenting with SOB. PMH is significant for bipolar 1 disorder, T2DM, Tobacco abuse, Anemia, history of breast cancer s/p radiation and chemo.   COPD exacerbation: Strep and HIV negative. On Doxy.  - Vitals per floor  - Continue pulse oximetry  - Blood culture pending  - change Duonebs and albuterol to PRN - prednisone 40mg ,  - start Spiriva  - pneumovax 23 today - Mucinex  - pertussis, legionella - Outpatient PFTs and sleep study - Formal nurse walk test  HFmrEF: Echo in 2015 with ejection fraction of 45-50% and G1DD. No signs of fluid overload. BNP 9.0.  -Repeat echo if possible before discharge, at follow up fine too.   Hx of Breast Cstatus post lumpectomy and lymph node dissection, radiation and partial chemotherapy with (1 round of cyclophosphamide  and Docetaxel). See most recently by oncology on 02/06/16. Yearly mammogram showed no signs of cancer  - Continue anastrozole    History of GDM - no history of T2DM, A1C 5.5 now. Diet controlled  - Will hold off checking CBGs   Hx of Anemia: Hgb 14.8 on admission. 13.8 today - does not take iron   Biopolar Disorder: Home Lamictal, Seroquel, and Risperdal  - Continue home meds   Hx of urinary retention. seen by urology in the past  - continue prazosin   Right Hip Pain: stable.hx of fall on her hip 3 days after jumping from trailer height, Negative hip xray film. No signs of hematoma on examine. Patient likely with some bruising  - tylenol as needed for pain   History of tobacco use: 35 year pack history. Recently cut down to 4 cigarettes a day. States she really wants to stop smoking forever. -We'll offer nicotine patch as needed  FEN/GI: Regular Diet   Prophylaxis: Lovenox  Dispo: Home today   Kerrin Mo, MD Family Medicine, PGY 2 03/08/2017 12:56 PM

## 2017-03-08 NOTE — Discharge Instructions (Signed)
Dear Ms Owens Shark,   You were seen for worsening shortness of breath. We treated you for a COPD exacerbation and you improved. You are being sent home with 5 more days of the antibiotic Doxycycline and 3 more days of Prednisone. Please finish this course of antibiotics and steroids. Make sure you follow up with your primary care doctor at your appointment on 5/24.

## 2017-03-08 NOTE — Progress Notes (Signed)
  Echocardiogram 2D Echocardiogram has been performed.  Kimberly [name] 03/08/2017, 1:55 PM

## 2017-03-08 NOTE — Progress Notes (Signed)
Kimberly [name] Ready to be D/C'd Home per MD order.  Discussed prescriptions and follow up appointments with the patient. Prescriptions given to patient, medication list explained in detail. Pt verbalized understanding.  Allergies as of 03/08/2017   No Known Allergies     Medication List    TAKE these medications   albuterol 108 (90 Base) MCG/ACT inhaler Commonly known as:  PROVENTIL HFA;VENTOLIN HFA Inhale 2 puffs into the lungs every 6 (six) hours as needed for wheezing or shortness of breath.   anastrozole 1 MG tablet Commonly known as:  ARIMIDEX Take 1 tablet (1 mg total) by mouth daily. Start taking on:  03/09/2017   clonazePAM 1 MG tablet Commonly known as:  KLONOPIN Take 1 mg by mouth 3 (three) times daily.   cyclobenzaprine 10 MG tablet Commonly known as:  FLEXERIL Take 1 tablet (10 mg total) by mouth 3 (three) times daily as needed for muscle spasms.   doxycycline 100 MG tablet Commonly known as:  VIBRA-TABS Take 1 tablet (100 mg total) by mouth every 12 (twelve) hours.   ferrous sulfate 325 (65 FE) MG tablet Take 1 tablet (325 mg total) by mouth daily with breakfast.   gabapentin 300 MG capsule Commonly known as:  NEURONTIN Take 1 capsule (300 mg total) by mouth 2 (two) times daily.   ipratropium-albuterol 0.5-2.5 (3) MG/3ML Soln Commonly known as:  DUONEB Take 3 mLs by nebulization every 4 (four) hours as needed.   lamoTRIgine 200 MG tablet Commonly known as:  LAMICTAL Take 1 tablet (200 mg total) by mouth 2 (two) times daily.   polyethylene glycol packet Commonly known as:  MIRALAX Take 17 g by mouth daily.   prazosin 2 MG capsule Commonly known as:  MINIPRESS Take 2 mg by mouth at bedtime.   predniSONE 20 MG tablet Commonly known as:  DELTASONE Take 2 tablets (40 mg total) by mouth daily with breakfast. Start taking on:  03/09/2017   QUEtiapine 100 MG tablet Commonly known as:  SEROQUEL Take 100 mg by mouth at bedtime.   risperiDONE 3 MG  tablet Commonly known as:  RISPERDAL Take 1 tablet (3 mg total) by mouth at bedtime.   tiotropium 18 MCG inhalation capsule Commonly known as:  SPIRIVA Place 1 capsule (18 mcg total) into inhaler and inhale daily. Start taking on:  03/09/2017            Durable Medical Equipment        Start     Ordered   03/08/17 1348  For home use only DME Nebulizer/meds  Once    Question:  Patient needs a nebulizer to treat with the following condition  Answer:  COPD (chronic obstructive pulmonary disease) ([place])   03/08/17 1347   03/08/17 0000  DME Nebulizer/meds    Question:  Patient needs a nebulizer to treat with the following condition  Answer:  COPD (chronic obstructive pulmonary disease) ([place])   03/08/17 1408      Vitals:   03/08/17 0435 03/08/17 1012  BP: 129/83 129/73  Pulse: (!) 101 (!) 107  Resp: 18 17  Temp: 97.7 F (36.5 C) 98.3 F (36.8 C)    Skin clean, dry and intact without evidence of skin break down, no evidence of skin tears noted. IV catheter discontinued intact. Site without signs and symptoms of complications. Dressing and pressure applied. Pt denies pain at this time. No complaints noted.  An After Visit Summary was printed and given to the patient. Patient escorted via [place], and  D/C home via private auto.  Emilio Math, RN [facility] Phone [phone]

## 2017-03-08 NOTE — Progress Notes (Signed)
[place] email to Dr Gerarda Fraction (PCP) to update that patient needs sample of Spiriva from office if they have it and patient assistance form completed.

## 2017-03-09 LAB — BORDETELLA PERTUSSIS PCR
B parapertussis, DNA: NEGATIVE
B pertussis, DNA: NEGATIVE

## 2017-03-11 LAB — CULTURE, BLOOD (ROUTINE X 2)
Culture: NO GROWTH
Culture: NO GROWTH
Special Requests: ADEQUATE
Special Requests: ADEQUATE

## 2017-03-13 NOTE — Progress Notes (Signed)
Subjective: Chief Complaint  Patient presents with  . Hospitalization Follow-up    copd  . Hip Pain     HPI: Kimberly [name] is a 46 y.o. presenting to clinic today for hospital follow-up. She was admitted from 03/06/17-03/09/15 for COPD exacerbation. Patient states she feels much improved. Denies wheezing, coughing, dyspnea, chest pain. Patient completed all her medications. Continues to smoke but has decreased amount. Does not have any Spiriva left. Has been giving herself breathing treatments q4hrs not due to symptoms but just because she was told to. Unable to afford. Needs medication assistance application completed. Has pulmonology follow-up scheduled.   Also complaining of back/hip pain in the right. States that pain has worsened since her fall prior to hospitlization. Feels a shooting pain in her pack that comes around to her inner thigh. She has tried taking her flexeril and gabapentin without relief. Worse when she walks or stands up. Relieved with bending forward. Had hip XR in hospital that was negative for any acute fractures or changes.   ROS noted in HPI.   Past Medical, Surgical, Social, and Family History Reviewed & Updated per EMR.    History  Smoking Status  . Current Every Day Smoker  . Packs/day: 1.00  . Years: 32.00  . Types: Cigarettes  Smokeless Tobacco  . Never Used    Comment: 11/26/14 smoking 1 pack every 2 days    Objective: BP 128/74   Pulse (!) 114   Temp 98.6 F (37 C) (Oral)   Ht 5\' 9"  (1.753 m)   Wt 289 lb (131.1 kg)   SpO2 97%   BMI 42.68 kg/m  Vitals and nursing notes reviewed  Physical Exam  Constitutional: She is well-developed, well-nourished, and in no distress.  HENT:  Head: Normocephalic and atraumatic.  Eyes: Conjunctivae and EOM are normal.  Cardiovascular: Regular rhythm.  Tachycardia present.   Pulmonary/Chest: Effort normal and breath sounds normal. No respiratory distress. She has no wheezes. She has no rales.    Musculoskeletal:       Lumbar back: She exhibits decreased range of motion, tenderness and pain. She exhibits no deformity.  Worsening of pain with back extension and relief with forward bending. Inspection: Unremarkable    Neurological: She has normal sensation, normal strength and intact cranial nerves. She displays no weakness. She exhibits normal muscle tone. She has an abnormal Straight Leg Raise Test. Gait abnormal.    Assessment/Plan: Please see problem based Assessment and Plan PATIENT EDUCATION PROVIDED: See AVS    Diagnosis and plan along with any newly prescribed medication(s) were discussed in detail with this patient today. The patient verbalized understanding and agreed with the plan. Patient advised if symptoms worsen return to clinic or ER.    Orders Placed This Encounter  Procedures  . MR Lumbar Spine Wo Contrast    Standing Status:   Future    Standing Expiration Date:   05/14/2018    Order Specific Question:   Reason for Exam (SYMPTOM  OR DIAGNOSIS REQUIRED)    Answer:   discogenic back pain    Order Specific Question:   What is the patient's sedation requirement?    Answer:   No Sedation    Order Specific Question:   Does the patient have a pacemaker or implanted devices?    Answer:   No    Order Specific Question:   Preferred imaging location?    Answer:   Internal    Order Specific Question:  Radiology Contrast Protocol - do NOT remove file path    Answer:   [email or web address]  . Ambulatory referral to Neurosurgery    Referral Priority:   Routine    Referral Type:   Surgical    Referral Reason:   Specialty Services Required    Requested Specialty:   Neurosurgery    Number of Visits Requested:   1    Meds ordered this encounter  Medications  . oxyCODONE-acetaminophen (PERCOCET) 10-325 MG tablet    Sig: Take 1 tablet by mouth every 8 (eight) hours as needed for pain.    Dispense:  20 tablet    Refill:  [place],  DO 03/14/2017, 2:42 PM PGY-3, [facility]

## 2017-03-14 ENCOUNTER — Ambulatory Visit (INDEPENDENT_AMBULATORY_CARE_PROVIDER_SITE_OTHER): Payer: Medicare PPO | Admitting: Obstetrics and Gynecology

## 2017-03-14 ENCOUNTER — Encounter: Payer: Self-pay | Admitting: Obstetrics and Gynecology

## 2017-03-14 VITALS — BP 128/74 | HR 114 | Temp 98.6°F | Ht 69.0 in | Wt 289.0 lb

## 2017-03-14 DIAGNOSIS — Z09 Encounter for follow-up examination after completed treatment for conditions other than malignant neoplasm: Secondary | ICD-10-CM | POA: Diagnosis not present

## 2017-03-14 DIAGNOSIS — M5136 Other intervertebral disc degeneration, lumbar region: Secondary | ICD-10-CM

## 2017-03-14 DIAGNOSIS — J449 Chronic obstructive pulmonary disease, unspecified: Secondary | ICD-10-CM | POA: Diagnosis not present

## 2017-03-14 DIAGNOSIS — R000 Tachycardia, unspecified: Secondary | ICD-10-CM

## 2017-03-14 MED ORDER — OXYCODONE-ACETAMINOPHEN 10-325 MG PO TABS: 1.0000 | ORAL_TABLET | Freq: Three times a day (TID) | ORAL | 0 refills | Status: DC | PRN

## 2017-03-14 MED FILL — OXYCODONE-ACETAMINOPHEN 10-: 10-325 | 7 days supply | Qty: 20 | Fill #0

## 2017-03-14 NOTE — Patient Instructions (Addendum)
Please go today to get back x-ray MRI ordered but likely wont get done until the x-ray  Referral made to back specialist Pain medications given Continue flexeril, ibuprofen, and gabapentin in between pain medication  Follow-up if 3 weeks for back pain

## 2017-03-16 ENCOUNTER — Emergency Department (HOSPITAL_COMMUNITY)
Admission: EM | Admit: 2017-03-16 | Discharge: 2017-03-16 | Disposition: A | Discharge status: Home / Self Care | Payer: Medicare PPO | Attending: Emergency Medicine | Admitting: Emergency Medicine

## 2017-03-16 ENCOUNTER — Encounter (HOSPITAL_COMMUNITY): Payer: Self-pay | Admitting: *Deleted

## 2017-03-16 DIAGNOSIS — Y939 Activity, unspecified: Secondary | ICD-10-CM | POA: Diagnosis not present

## 2017-03-16 DIAGNOSIS — F17210 Nicotine dependence, cigarettes, uncomplicated: Secondary | ICD-10-CM | POA: Diagnosis not present

## 2017-03-16 DIAGNOSIS — Z79899 Other long term (current) drug therapy: Secondary | ICD-10-CM | POA: Diagnosis not present

## 2017-03-16 DIAGNOSIS — W1830XA Fall on same level, unspecified, initial encounter: Secondary | ICD-10-CM | POA: Diagnosis not present

## 2017-03-16 DIAGNOSIS — J449 Chronic obstructive pulmonary disease, unspecified: Secondary | ICD-10-CM | POA: Insufficient documentation

## 2017-03-16 DIAGNOSIS — M5441 Lumbago with sciatica, right side: Secondary | ICD-10-CM | POA: Insufficient documentation

## 2017-03-16 DIAGNOSIS — T148XXA Other injury of unspecified body region, initial encounter: Secondary | ICD-10-CM | POA: Diagnosis not present

## 2017-03-16 DIAGNOSIS — Y999 Unspecified external cause status: Secondary | ICD-10-CM | POA: Insufficient documentation

## 2017-03-16 DIAGNOSIS — Z853 Personal history of malignant neoplasm of breast: Secondary | ICD-10-CM | POA: Insufficient documentation

## 2017-03-16 DIAGNOSIS — Y929 Unspecified place or not applicable: Secondary | ICD-10-CM | POA: Insufficient documentation

## 2017-03-16 DIAGNOSIS — M5489 Other dorsalgia: Secondary | ICD-10-CM | POA: Diagnosis not present

## 2017-03-16 DIAGNOSIS — M549 Dorsalgia, unspecified: Secondary | ICD-10-CM | POA: Diagnosis present

## 2017-03-16 MED ORDER — HYDROMORPHONE HCL 1 MG/ML IJ SOLN
1.0000 mg | Freq: Once | INTRAMUSCULAR | Status: AC
  Administered 2017-03-16: 1 mg via INTRAVENOUS
  Filled 2017-03-16: qty 1

## 2017-03-16 MED ORDER — GABAPENTIN 300 MG PO CAPS: 300.0000 mg | ORAL_CAPSULE | Freq: Three times a day (TID) | ORAL | 0 refills | Status: DC

## 2017-03-16 NOTE — ED Notes (Signed)
Patient left at this time with all belongings. 

## 2017-03-16 NOTE — ED Triage Notes (Signed)
Pt arrived by [facility]. Pt reports right side lower back pain that radiates down her right buttock and leg. Reports having a fall on wed. Has MRI scheduled for 6/4 but unable to wait that long, no relief with pain meds prescribed.

## 2017-03-16 NOTE — ED Provider Notes (Signed)
[facility] DEPT Provider Note   CSN: [id number] Arrival date & time: 03/16/17  1727  By signing my name below, I, Neta Mends, attest that this documentation has been prepared under the direction and in the presence of Providence Lanius, [place]. Electronically Signed: Neta Mends, ED Scribe. 03/16/2017. 7:38 PM.    History   Chief Complaint Chief Complaint  Patient presents with  . Back Pain     The history is provided by the patient. No language interpreter was used.   HPI Comments:  Kimberly [name] is a 46 y.o. female who presents to the Emergency Department complaining of worsening lower back/hip pain x 5 days. She reports that she has been dealing with history of back and hip pain for a long time. Pt reports that 5 days ago she fell off of a camper and landed on her buttocks, and she has been having worsening back and buttock  pain since the fall. She states that the pain radiates to her Posterior right buttock and leg. Pt was seen by her PCP 2 days ago her evaluation of the pain. At that time she was scheduled for an MRI on 03/27/17. She was discharged home with oxycodone for pain relief. States that she has been taking it. She also reports that she has been taking gabapentin and Flexeril as no relief of symptoms. She has been able to ambulate without difficulty but states that she has worsening pain. Patient denies any back surgery, history of IV drug abuse, fevers, urinary or bowel incontinence, saddle anesthesia, numbness/weakness of her arms or legs.   Past Medical History:  Diagnosis Date  . Anemia   . Arthritis   . Bipolar 1 disorder ([place])   . Blood transfusion without reported diagnosis 2007   post bleeding childbirth  . Cancer ([place])    eval  lt breast cancer  . Diabetes mellitus without complication ([place])    gestational  . Heart murmur    as a child-not adult-no cardiac work up  . History of radiation therapy 08/31/14-10/21/14   left breast/ left  supraclavicular 50.4 Gy 28 fx, lef tposterior axillary boost 9.52 Gy 28 fx, left rbeast boost/ 10 Gy 5 fx    Patient Active Problem List   Diagnosis Date Noted  . Tachycardia   . Fall   . Cough   . SOB (shortness of breath)   . COPD exacerbation ([place]) 03/06/2017  . External hemorrhoid, thrombosed 12/03/2016  . Grieving 11/21/2016  . Mixed incontinence 09/29/2015  . Genetic testing 08/26/2015  . Malignant neoplasm of upper-outer quadrant of left breast in female, estrogen receptor positive ([place]) 05/07/2014  . Disorder of bladder 09/29/2008  . LOW BACK PAIN, CHRONIC 09/29/2008  . OBESITY, MORBID 11/19/2007  . ANEMIA, IRON DEFICIENCY, CHRONIC 11/14/2007  . Mixed bipolar I disorder ([employer]) 02/20/2007  . TOBACCO ABUSE 02/20/2007    Past Surgical History:  Procedure Laterality Date  . BREAST BIOPSY Left 04/01/14  . BREAST BIOPSY Right 04/19/14  . BREAST LUMPECTOMY WITH AXILLARY LYMPH NODE DISSECTION Bilateral 05/03/14  . DILATION AND CURETTAGE OF UTERUS     after childbirth  . MULTIPLE TOOTH EXTRACTIONS     only 5 left  . PORTACATH PLACEMENT Right 06/18/2014   Procedure: INSERTION PORT-A-CATH;  Surgeon: [name], MD;  Location: [facility];  Service: General;  Laterality: Right;  . TONSILLECTOMY AND ADENOIDECTOMY      OB History    No data available       Home  Medications    Prior to Admission medications   Medication Sig Start Date End Date Taking? Authorizing Provider  albuterol (PROVENTIL HFA;VENTOLIN HFA) 108 (90 Base) MCG/ACT inhaler Inhale 2 puffs into the lungs every 6 (six) hours as needed for wheezing or shortness of breath. 02/25/17   [name], DO  anastrozole (ARIMIDEX) 1 MG tablet Take 1 tablet (1 mg total) by mouth daily. 03/09/17   Leeanne Rio, MD  clonazePAM (KLONOPIN) 1 MG tablet Take 1 mg by mouth 3 (three) times daily.  03/21/14   [provider]  cyclobenzaprine (FLEXERIL) 10 MG tablet Take 1 tablet (10 mg total) by  mouth 3 (three) times daily as needed for muscle spasms. 09/04/16   [name], DO  doxycycline (VIBRA-TABS) 100 MG tablet Take 1 tablet (100 mg total) by mouth every 12 (twelve) hours. 03/08/17   Mikell, Jeani Sow, MD  ferrous sulfate 325 (65 FE) MG tablet Take 1 tablet (325 mg total) by mouth daily with breakfast. 09/29/15   Donnamae Jude, MD  gabapentin (NEURONTIN) 300 MG capsule Take 1 capsule (300 mg total) by mouth 3 (three) times daily. 03/16/17   Volanda Napoleon, [name]  ipratropium-albuterol (DUONEB) 0.5-2.5 (3) MG/3ML SOLN Take 3 mLs by nebulization every 4 (four) hours as needed. 03/08/17   Mikell, Jeani Sow, MD  lamoTRIgine (LAMICTAL) 200 MG tablet Take 1 tablet (200 mg total) by mouth 2 (two) times daily. 07/25/15   Janora Norlander, DO  oxyCODONE-acetaminophen (PERCOCET) 10-325 MG tablet Take 1 tablet by mouth every 8 (eight) hours as needed for pain. 03/14/17   [name], DO  polyethylene glycol [facility]) packet Take 17 g by mouth daily. 12/03/16   McKeag, Marylynn Pearson, MD  prazosin (MINIPRESS) 2 MG capsule Take 2 mg by mouth at bedtime.  07/01/14   [provider]  QUEtiapine (SEROQUEL) 100 MG tablet Take 100 mg by mouth at bedtime.    [provider]  risperiDONE (RISPERDAL) 3 MG tablet Take 1 tablet (3 mg total) by mouth at bedtime. 11/16/16   [name], DO  tiotropium (SPIRIVA) 18 MCG inhalation capsule Place 1 capsule (18 mcg total) into inhaler and inhale daily. 03/09/17   Tonette Bihari, MD    Family History Family History  Problem Relation Age of Onset  . Heart disease Father   . Cancer Father        Prostate    Social History Social History  Substance Use Topics  . Smoking status: Current Every Day Smoker    Packs/day: 1.00    Years: 32.00    Types: Cigarettes  . Smokeless tobacco: Never Used     Comment: 11/26/14 smoking 1 pack every 2 days  . Alcohol use No     Allergies   Patient has no known allergies.   Review of  Systems Review of Systems  Constitutional: Negative for fever.  Musculoskeletal: Positive for back pain and myalgias.  Neurological: Negative for weakness and numbness.     Physical Exam Updated Vital Signs BP 123/88   Pulse 93   Temp 98.9 F (37.2 C) (Oral)   Resp 18   SpO2 98%   Physical Exam  Constitutional: She is oriented to person, place, and time. She appears well-developed and well-nourished. No distress.  Appears uncomfortable but sitting in bed in no acute distress.  HENT:  Head: Normocephalic and atraumatic.  Eyes: Conjunctivae are normal.  Neck: Normal range of motion. Neck supple. No spinous  process tenderness present.  Flexion/extension and lateral movement of neck intact without difficult. No midline bony tenderness.  Cardiovascular: Normal rate.   Pulmonary/Chest: Effort normal.  Abdominal: She exhibits no distension.  Musculoskeletal:       Cervical back: She exhibits no tenderness.  Diffuse muscular tenderness to palpation to the  thoracic area. No midline bony tenderness. No deformities or crepitus noted. Diffuse muscular tenderness to the lumbar region.  Neurological: She is alert and oriented to person, place, and time. GCS eye subscore is 4. GCS verbal subscore is 5. GCS motor subscore is 6.  Follows commands, Moves all extremities  5/5 strength to BUE and LLE. Limited exam of RLE due to poor effort of patient.  Sensation intact throughout  No slurred speech. No facial droop.   Skin: Skin is warm and dry.  Psychiatric: She has a normal mood and affect.  Nursing note and vitals reviewed.    ED Treatments / Results  DIAGNOSTIC STUDIES:  Oxygen Saturation is 95% on RA, normal by my interpretation.    COORDINATION OF CARE:  7:27 PM Discussed treatment plan with pt at bedside and pt agreed to plan.   Labs (all labs ordered are listed, but only abnormal results are displayed) Labs Reviewed - No data to display  EKG  EKG Interpretation None        Radiology No results found.  Procedures Procedures (including critical care time)  Medications Ordered in ED Medications  HYDROmorphone (DILAUDID) injection 1 mg (1 mg Intravenous Given 03/16/17 2025)     Initial Impression / Assessment and Plan / ED Course  I have reviewed the triage vital signs and the nursing notes.  Pertinent labs & imaging results that were available during my care of the patient were reviewed by me and considered in my medical decision making (see chart for details).     46 year old female who presents with back pain. Notes that the history of back pain and "pinched nerve" that she has been seen by her doctor for. She was seen by her primary care doctor 2 days ago and is scheduled for an outpatient MRI on 03/27/2017. She's been prescribed a short course of pain medications for her pain. She comes in today because she said the pain was not controlled by her use of pain medications. No red flag symptoms on history. No neuro deficits to bilateral upper extremities and left lower x-rays. Poor effort on right lower extremity. Will plan to give analgesics in the department and reassess.  Reexamination after pain medications given. Patient reports improvement in pain. She is able to ambulate without difficulty. Reexamination of neuro exam shows that she now has full strength of right lower extremity and left lower extremity. She is requesting some of "that medication to go home with." Explained her that she has pain medication that she should continue using that. She does also continue using her gabapentin and Flexeril as directed by her primary care doctor. Patient states that she has run out of her gabapentin. Will provide her with a short course of gabapentin that she can take to help with the shooting pain down her legs. Instructed her to continue taking pain medications as directed. Follow-up with her primary care doctor next 24-48 hours for further evaluation. Strict  return precautions discussed. Patient's Expresses understanding and agreement to plan.  Final Clinical Impressions(s) / ED Diagnoses   Final diagnoses:  Acute right-sided low back pain with right-sided sciatica    New Prescriptions New Prescriptions  GABAPENTIN (NEURONTIN) 300 MG CAPSULE    Take 1 capsule (300 mg total) by mouth 3 (three) times daily.   I personally performed the services described in this documentation, which was scribed in my presence. The recorded information has been reviewed and is accurate.    Volanda Napoleon, [name] 03/17/17 2482    Dorie Rank, MD 03/17/17 [phone]

## 2017-03-16 NOTE — Discharge Instructions (Signed)
Follow-up with your primary care doctor in the next 24-48 hour for further evaluation.  If you do not have a primary care doctor, you can use one provided in the list below.   Take Medications as directed.   Return to the Emergency Department for: Fever  Leg weakness Urinary or bowel accidents  Abdominal pain Worsening or severe pain    Back Pain:   Your back pain should be treated with medicines such as ibuprofen or aleve and this back pain should get better over the next 2 weeks.  However if you develop severe or worsening pain, low back pain with fever, numbness, weakness or inability to walk or urinate, you should return to the ER immediately.    Please follow up with your doctor this week for a recheck if still having symptoms . Low back pain is discomfort in the lower back that may be due to injuries to muscles and ligaments around the spine.  Occasionally, it may be caused by a a problem to a part of the spine called a disc.  The pain may last several days or a week;  However, most patients get completely well in 4 weeks.  Self - care:  The application of heat can help soothe the pain.  Maintaining your daily activities, including walking, is encourged, as it will help you get better faster than just staying in bed.  Medications are also useful to help with pain control.  A commonly prescribed medications includes acetaminophen.  This medication is generally safe, though you should not take more than 8 of the extra strength (500mg ) pills a day.  Non steroidal anti inflammatory medications including Ibuprofen and naproxen;  These medications help both pain and swelling and are very useful in treating back pain.  They should be taken with food, as they can cause stomach upset, and more seriously, stomach bleeding.

## 2017-03-20 DIAGNOSIS — J449 Chronic obstructive pulmonary disease, unspecified: Secondary | ICD-10-CM | POA: Insufficient documentation

## 2017-03-20 DIAGNOSIS — M5136 Other intervertebral disc degeneration, lumbar region: Secondary | ICD-10-CM | POA: Insufficient documentation

## 2017-03-20 NOTE — Assessment & Plan Note (Signed)
Has chronic low back pain that has worsened in the setting of fall. Symptoms and physical consistent with a discogenic origin of back pain. Neurovascularly intact without signs of cauda equina.  Patient has no back imaging to review. DG lumbar spine ordered as well as MRI. Will go ahead and put in neurosurgery referral. Pain medication given.

## 2017-03-20 NOTE — Assessment & Plan Note (Signed)
Presumed at this point based off clinical information. No recorded PFTs. Recent hospilization for COPD exacerbation; now resolved. Patient continues to smoke. Discussed tobacco cessation.  Has pulmonology appointment scheduled for PFTs and management. Continue prn breathing treatments. Application filled out for Spiriva.

## 2017-03-20 NOTE — Assessment & Plan Note (Signed)
In setting of breathing treatments that patient has been continuously giving herself. Will recheck at next visit.

## 2017-03-27 ENCOUNTER — Other Ambulatory Visit: Payer: Self-pay | Admitting: Internal Medicine

## 2017-03-27 ENCOUNTER — Encounter: Payer: Self-pay | Admitting: Internal Medicine

## 2017-03-27 ENCOUNTER — Ambulatory Visit (HOSPITAL_COMMUNITY)
Admission: RE | Admit: 2017-03-27 | Discharge: 2017-03-27 | Disposition: A | Discharge status: Home / Self Care | Payer: Medicare PPO | Source: Ambulatory Visit | Attending: Family Medicine | Admitting: Family Medicine

## 2017-03-27 ENCOUNTER — Ambulatory Visit (INDEPENDENT_AMBULATORY_CARE_PROVIDER_SITE_OTHER): Payer: Medicare PPO | Admitting: Internal Medicine

## 2017-03-27 VITALS — BP 122/84 | HR 113 | Ht 69.0 in | Wt 283.0 lb

## 2017-03-27 DIAGNOSIS — F17210 Nicotine dependence, cigarettes, uncomplicated: Secondary | ICD-10-CM | POA: Diagnosis not present

## 2017-03-27 DIAGNOSIS — M5136 Other intervertebral disc degeneration, lumbar region: Secondary | ICD-10-CM

## 2017-03-27 DIAGNOSIS — M5126 Other intervertebral disc displacement, lumbar region: Secondary | ICD-10-CM | POA: Diagnosis not present

## 2017-03-27 DIAGNOSIS — R0609 Other forms of dyspnea: Secondary | ICD-10-CM

## 2017-03-27 DIAGNOSIS — R0600 Dyspnea, unspecified: Secondary | ICD-10-CM

## 2017-03-27 DIAGNOSIS — M48061 Spinal stenosis, lumbar region without neurogenic claudication: Secondary | ICD-10-CM | POA: Insufficient documentation

## 2017-03-27 DIAGNOSIS — M545 Low back pain: Secondary | ICD-10-CM | POA: Diagnosis not present

## 2017-03-27 DIAGNOSIS — R05 Cough: Secondary | ICD-10-CM | POA: Diagnosis not present

## 2017-03-27 DIAGNOSIS — R058 Other specified cough: Secondary | ICD-10-CM | POA: Insufficient documentation

## 2017-03-27 MED ORDER — PANTOPRAZOLE SODIUM 40 MG PO TBEC: 40.0000 mg | DELAYED_RELEASE_TABLET | Freq: Every day | ORAL | 2 refills | Status: DC

## 2017-03-27 MED ORDER — FAMOTIDINE 20 MG PO TABS: ORAL_TABLET | ORAL | 2 refills | Status: DC

## 2017-03-27 NOTE — Patient Instructions (Addendum)
Pantoprazole (protonix) 40 mg   Take  30-60 min before first meal of the day and Pepcid (famotidine)  20 mg one @  bedtime until return to office - this is the best way to tell whether stomach acid is contributing to your problem.    GERD (REFLUX)  is an extremely common cause of respiratory symptoms- especially a cough  just like yours , many times with no obvious heartburn at all.    It can be treated with medication, but also with lifestyle changes including elevation of the head of your bed (ideally with 6 inch  bed blocks),  Smoking cessation, avoidance of late meals, excessive alcohol, and avoid fatty foods, chocolate, peppermint, colas, red wine, and acidic juices such as orange juice.  NO MINT OR MENTHOL PRODUCTS SO NO COUGH DROPS   USE SUGARLESS CANDY INSTEAD (Jolley ranchers or Stover's or Life Savers) or even ice chips will also do - the key is to swallow to prevent all throat clearing. NO OIL BASED VITAMINS - use powdered substitutes.   The key is to stop smoking completely before smoking completely stops you - in your case it's not too late.  Plan B = backup  Only use your albuterol (spray) as a rescue medication to be used if you can't catch your breath by resting or doing a relaxed purse lip breathing pattern.  - The less you use it, the better it will work when you need it. - Ok to use up to 2 puffs  every 4 hours if you must but call for immediate appointment if use goes up over your usual need - Don't leave home without it !!  (think of it like the spare tire for your car)   Plan C -Crisis Use the nebulizer only if you try plan B first and it fails to work - ok to use up to every 4 hours if you must     Please schedule a follow up office visit in 6 weeks, call sooner if needed with full pfts

## 2017-03-27 NOTE — Progress Notes (Signed)
Subjective:     Patient ID: Kimberly [name], female   DOB: [birth year],     MRN: [id number]  HPI  88 yowf severely obese active smoker no resp problems at all through mulitple pregnancies with last in 2005 at baseline 150 and up to 250 p last baby with peak wt 373 around 2016/2017 with acute onset sob cough 02/28/18at death of husband from CVA and despite cutting down on smoking has had marked variability in sob     Date of birth: [birth year]        Age: 46 y.o.    Gender: female Date of Admission: 03/06/2017                      Date of Discharge: 03/08/17  Admitting Physician: Kinnie Feil, MD  Primary Care Provider: [name], DO Consultants: None  Indication for Hospitalization: Hypoxia  Discharge Diagnoses/Problem List:  COPD exacerbation Asthma Bipolar Type 1 History of Gestational Diabetes HFrEF (ECHO 2015 45-50%) History of breast cancer s/p lumpectomy, LND, radiation, partial chemo Tobacco use  Disposition: Discharge to home.   Discharge Condition: Stable. Oxygenating well on room air.   Discharge Exam:  General: Well appearing, no distress Cardiovascular: RRR, no murmurs Respiratory: no increased work of breathing,no rhonchi. Mild expiratory wheezes heard in upper right lobe. No coughing with inspiration. Abdomen: Normoactive bowel sounds. Non distended.  Extremities: No edema.   Brief Hospital Course:  Kimberly [name] is a 46 yo with PMH of asthma, 35 pack year smoking history, breast cancer in 2014 treated with chemotherapy and radiation, gestational DM, HFrEF (2015 ECHO 45-50%) and Bipolar disorder who was sent from clinic with cough and acute worsening shortness of breath and hypoxia. She was seen recently at her PCP for similar issues and was treated with Augmentin and Levaquin then she presented to clinic looking dyspneic and found to be satting in the 70s-80s. She was very rhonchorous and unable to inspire deeply without profuse coughing  and sputum production. In the ED, CXR and CT chest were negative for signs of PE and pneumonia. EKG without ischemic changes, and troponins were negative x3. She was treated initially with Ceftriaxone, azithromycin, magnesium, Solumedrol, continuous albuterol treatment for likely COPD exacerbation. She was on 3L O2.  Infectious etiology was considered and HIV, Strep pneumo were negatve. Legionella and Pertussis were low on the differential and had not resulted by the time of discharge. CHF exacerbation was considered too, but no signs of fluid overload and BNP 9. During admission she was treated with prednisone, scheduled duonebs and albuterol, mucinex and antibiotics transitioned to Doxycycline on 5/17 to be treated for 7 days total (end on 5/21). Her exam improved over 24 hours and she was transitioned to room air where she continued to saturate well (97%). ECHO was done on day of discharge ----Spiriva was started and duonebs and albuterol were no longer scheduled. Kimberly [name] was clear that she wants to stop smoking, she is down to 4 cigarettes a day. She also mentioned that her house is under construction and black mold has been found. This is concerning for renewal of her symptoms upon going home if symptoms were caused by airborne irritants.  Issues for Follow Up:  1. Follow up legionella and pertussis results 2. Please refer to Dr. Valentina Lucks for smoking cessation 3. Pulmonary Function Test out patient, currently appointment with Dr. Melvyn Novas  4. Please follow up on ECHO results 5. Make sure patient completed doxycycline and  prednisone course  6. Discuss medication options with patient - patient has to meet her deductible, can not currently afford Spiriva. No options available per case management - sent patient home with Spiriva inhaler and nebulizer from hospital - will need to discuss next steps with patient - possibly send to Dr. Valentina Lucks for medication management    Significant Procedures:  none  Significant Labs and Imaging:   LastLabs   Recent Labs Lab 03/06/17 1620 03/07/17 0453 03/08/17 1127  WBC 8.4 9.2 11.7*  HGB 14.8 13.8 13.5  HCT 45.4 42.6 41.9  PLT 231 243 223      LastLabs   Recent Labs Lab 03/06/17 1620 03/07/17 0453 03/08/17 1127  NA 139 138 140  K 3.5 3.9 3.9  CL 103 104 106  CO2 28 24 23   GLUCOSE 91 146* 105*  BUN 7 7 7   CREATININE 0.95 0.89 0.88  CALCIUM 9.2 9.2 9.4  ALKPHOS 57 58  --   AST 24 24  --   ALT 21 19  --   ALBUMIN 3.9 3.6  --       ECHO: pending   Results/Tests Pending at Time of Discharge: Legionella, Pertussis  Discharge Medications:  Allergies as of 03/08/2017   No Known Allergies                    Medication List           TAKE these medications          albuterol 108 (90 Base) MCG/ACT inhaler Commonly known as:  PROVENTIL HFA;VENTOLIN HFA Inhale 2 puffs into the lungs every 6 (six) hours as needed for wheezing or shortness of breath.   clonazePAM 1 MG tablet Commonly known as:  KLONOPIN Take 1 mg by mouth 3 (three) times daily.   cyclobenzaprine 10 MG tablet Commonly known as:  FLEXERIL Take 1 tablet (10 mg total) by mouth 3 (three) times daily as needed for muscle spasms.   doxycycline 100 MG tablet Commonly known as:  VIBRA-TABS Take 1 tablet (100 mg total) by mouth every 12 (twelve) hours.   ferrous sulfate 325 (65 FE) MG tablet Take 1 tablet (325 mg total) by mouth daily with breakfast.   gabapentin 300 MG capsule Commonly known as:  NEURONTIN Take 1 capsule (300 mg total) by mouth 2 (two) times daily.   ipratropium-albuterol 0.5-2.5 (3) MG/3ML Soln Commonly known as:  DUONEB Take 3 mLs by nebulization every 4 (four) hours as needed.   lamoTRIgine 200 MG tablet Commonly known as:  LAMICTAL Take 1 tablet (200 mg total) by mouth 2 (two) times daily.   polyethylene glycol packet Commonly known as:  MIRALAX Take 17 g by mouth daily.   prazosin 2 MG  capsule Commonly known as:  MINIPRESS Take 2 mg by mouth at bedtime.   predniSONE 20 MG tablet Commonly known as:  DELTASONE Take 2 tablets (40 mg total) by mouth daily with breakfast. Start taking on:  03/09/2017   QUEtiapine 100 MG tablet Commonly known as:  SEROQUEL Take 100 mg by mouth at bedtime.   risperiDONE 3 MG tablet Commonly known as:  RISPERDAL Take 1 tablet (3 mg total) by mouth at bedtime.   tiotropium 18 MCG inhalation capsule Commonly known as:  SPIRIVA Place 1 capsule (18 mcg total) into inhaler and inhale daily. Start taking on:  03/09/2017    03/27/2017 1st [facility] office visit/ Kimberly [name]   Chief Complaint  Patient presents with  .  Pulmonary Consult    Referred by Dr. Luiz Blare.  Pt c/o SOB since 11-12-16- started the day her spouse died and "I have been sick ever since".  She states she gets SOB walking to the end of her street and back.  She is using albuterol rescue inhaler and duoneb both 4 x daily on average.   doe = 100 ft  whereas prior to January 2018 doe = MMRC1 =   Walked nl pace, flat grade, can't hurry or go uphills or steps s sob  @ at  [employer] [address]  Sob was assoc with severe cough no better with saba  Since d/c placed on spiriva but not taking it and just using the nebulizer and last used around 3  prior to [place] having coughing fits but not as bad   No obvious patterns in day to day or daytime variability or assoc excess/ purulent sputum or mucus plugs or hemoptysis or cp or chest tightness, subjective wheeze or overt sinus or hb symptoms. No unusual exp hx or h/o childhood pna/ asthma or knowledge of premature birth.  Sleeping ok without nocturnal  or early am exacerbation  of respiratory  c/o's or need for noct saba. Also denies any obvious fluctuation of symptoms with weather or environmental changes or other aggravating or alleviating factors except as outlined above   Current Medications, Allergies, Complete Past Medical History, Past  Surgical History, Family History, and Social History were reviewed in [employer] record.  ROS  The following are not active complaints unless bolded sore throat, dysphagia, dental problems, itching, sneezing,  nasal congestion or excess/ purulent secretions, ear ache,   fever, chills, sweats, unintended wt loss, classically pleuritic or exertional cp,  orthopnea pnd or leg swelling, presyncope, palpitations, abdominal pain, anorexia, nausea, vomiting, diarrhea  or change in bowel or bladder habits, change in stools or urine, dysuria,hematuria,  rash, arthralgias, visual complaints, headache, numbness, weakness or ataxia or problems with walking or coordination,  change in mood/affect ever since husband's death or memory.           Review of Systems     Objective:   Physical Exam    Obese amb wf nad   Wt Readings from Last 3 Encounters:  03/27/17 283 lb (128.4 kg)  03/14/17 289 lb (131.1 kg)  03/06/17 297 lb 9.9 oz (135 kg)    Vital signs reviewed  - Note on arrival 02 sats  96% on RA      HEENT: nl dentition, turbinates bilaterally, and oropharynx. Nl external ear canals without cough reflex   NECK :  without JVD/Nodes/TM/ nl carotid upstrokes bilaterally   LUNGS: no acc muscle use,  Nl contour chest which is clear to A and P bilaterally without cough on insp or exp maneuvers   CV:  RRR  no s3 or murmur or increase in P2, and no edema   ABD:  soft and nontender with nl inspiratory excursion in the supine position. No bruits or organomegaly appreciated, bowel sounds nl  Kimberly:  Nl gait/ ext warm without deformities, calf tenderness, cyanosis or clubbing No obvious joint restrictions   SKIN: warm and dry without lesions    NEURO:  alert, approp, nl sensorium with  no motor or cerebellar deficits apparent.     I personally reviewed images and agree with radiology impression as follows:   Chest CTa 03/06/17 Negative for acute pulmonary embolus or  aortic dissection. Clear lung fields.  Assessment:

## 2017-03-27 NOTE — Assessment & Plan Note (Addendum)
Spirometry 03/27/2017  FEV1 2.29 (67%)  Ratio 76 s curvature 3 h p duoneb    Symptoms are markedly disproportionate to objective findings and not clear this is actually much of a  lung problem but pt does appear to have difficult to sort out respiratory symptoms of unknown origin for which  DDX  = almost all start with A and  include Adherence, Ace Inhibitors, Acid Reflux, Active Sinus Disease, Alpha 1 Antitripsin deficiency, Anxiety masquerading as Airways dz,  ABPA,  Allergy(esp in young), Aspiration (esp in elderly), Adverse effects of meds,  Active smokers, A bunch of PE's (a small clot burden can't cause this syndrome unless there is already severe underlying pulm or vascular dz with poor reserve) plus two Bs  = Bronchiectasis and Beta blocker use..and one C= CHF     Adherence is always the initial "prime suspect" and is a multilayered concern that requires a "trust but verify" approach in every patient - starting with knowing how to use medications, especially inhalers, correctly, keeping up with refills and understanding the fundamental difference between maintenance and prns vs those medications only taken for a very short course and then stopped and not refilled.  - advised to only use saba and always use the hfa form first to reduce risk of tachyphylaxis   Active smoker  (see separate a/p)   ? Acid (or non-acid) GERD > always difficult to exclude as up to 75% of pts in some series report no assoc GI/ Heartburn symptoms> rec max (24h)  acid suppression and diet restrictions/ reviewed and instructions given in writing.   ? Anxiety/depression > usually at the bottom of this list of usual suspects but should be much higher on this pt's based on H and P and note already on psychotropics .   ?  A  Bunch of PE's > ruled out by CTa  ? chf > echo 03/08/17 c/w only mild AI    Will start with aggressive rx for gerd and f/u with full pfts in 6 weeks  Total time devoted to counseling  > 50 % of  initial 60 min office visit:  review case with pt/ discussion of options/alternatives/ personally creating written customized instructions  in presence of pt  then going over those specific  Instructions directly with the pt including how to use all of the meds but in particular covering each new medication in detail and the difference between the maintenance= "automatic" meds and the prns using an action plan format for the latter (If this problem/symptom => do that organization reading Left to right).  Please see AVS from this visit for a full list of these instructions which I personally wrote for this pt and  are unique to this visit.

## 2017-03-27 NOTE — Assessment & Plan Note (Signed)

## 2017-03-27 NOTE — Assessment & Plan Note (Signed)
Body mass index is 41.79 kg/m.  -  trending down/ encouraged  Lab Results  Component Value Date   TSH 1.629 03/06/2017     Contributing to gerd risk/ doe/reviewed the need and the process to achieve and maintain neg calorie balance > defer f/u primary care including intermittently monitoring thyroid status

## 2017-03-28 ENCOUNTER — Other Ambulatory Visit: Payer: Self-pay | Admitting: Obstetrics and Gynecology

## 2017-03-28 MED ORDER — OXYCODONE-ACETAMINOPHEN 10-325 MG PO TABS: 1.0000 | ORAL_TABLET | Freq: Three times a day (TID) | ORAL | 0 refills | Status: DC | PRN

## 2017-03-28 MED FILL — OXYCODONE-ACETAMINOPHEN 10-: 10-325 | 15 days supply | Qty: 45 | Fill #0

## 2017-03-28 NOTE — Assessment & Plan Note (Addendum)
Upper airway cough syndrome (previously labeled PNDS) , is  so named because it's frequently impossible to sort out how much is  CR/sinusitis with freq throat clearing (which can be related to primary GERD)   vs  causing  secondary (" extra esophageal")  GERD from wide swings in gastric pressure that occur with throat clearing, often  promoting self use of mint and menthol lozenges that reduce the lower esophageal sphincter tone and exacerbate the problem further in a cyclical fashion.   These are the same pts (now being labeled as having "irritable larynx syndrome" by some cough centers) who not infrequently have a history of having failed to tolerate ace inhibitors,  dry powder inhalers or biphosphonates or report having atypical/extraesophageal reflux symptoms that don't respond to standard doses of PPI  and are easily confused as having aecopd or asthma flares by even experienced allergists/ pulmonologists (myself included).   Try first rx for gerd -  see avs for instructions unique to this ov

## 2017-04-03 ENCOUNTER — Other Ambulatory Visit: Payer: Self-pay | Admitting: *Deleted

## 2017-04-03 MED ORDER — GABAPENTIN 300 MG PO CAPS: 300.0000 mg | ORAL_CAPSULE | Freq: Three times a day (TID) | ORAL | 0 refills | Status: DC

## 2017-04-08 DIAGNOSIS — J441 Chronic obstructive pulmonary disease with (acute) exacerbation: Secondary | ICD-10-CM | POA: Diagnosis not present

## 2017-04-11 DIAGNOSIS — M5126 Other intervertebral disc displacement, lumbar region: Secondary | ICD-10-CM | POA: Diagnosis not present

## 2017-04-16 ENCOUNTER — Other Ambulatory Visit: Payer: Self-pay | Admitting: Obstetrics and Gynecology

## 2017-04-16 NOTE — Telephone Encounter (Signed)
Needs refill on oxycodone °Please call when ready for pickup  °

## 2017-04-17 NOTE — Telephone Encounter (Signed)
Need more information from patient on if she saw the back specialist. Appt was made for 6/21. Did she get medicine from him?

## 2017-04-18 NOTE — Telephone Encounter (Signed)
Contacted pt and she stated she did go to the back specialist and they did not give her any medication for pain. Pt states she would like a refill as soon as possible. Please advise.

## 2017-04-19 MED ORDER — OXYCODONE-ACETAMINOPHEN 10-325 MG PO TABS: 1.0000 | ORAL_TABLET | Freq: Three times a day (TID) | ORAL | 0 refills | Status: DC | PRN

## 2017-04-19 NOTE — Telephone Encounter (Signed)
Tried to call patient but there was no answer and neither voicemail is set up. Shellia Hartl,CMA

## 2017-04-19 NOTE — Telephone Encounter (Signed)
Medication ready for pick-up. Limited amount given. Patient needs to meet with new PCP to establish pain contract and ongoing pain medication.

## 2017-04-19 NOTE — Telephone Encounter (Signed)
Patient is aware and will make an appointment when she comes to pick up her script. Kimberly [name],CMA

## 2017-04-23 ENCOUNTER — Telehealth: Payer: Self-pay

## 2017-04-23 NOTE — Telephone Encounter (Signed)
Clarification form filled out by provider and faxed to [facility]. Copy made and placed in scan pile for patients chart.

## 2017-05-03 ENCOUNTER — Encounter: Payer: Self-pay | Admitting: Family Medicine

## 2017-05-03 ENCOUNTER — Ambulatory Visit (INDEPENDENT_AMBULATORY_CARE_PROVIDER_SITE_OTHER): Payer: Medicare PPO | Admitting: Family Medicine

## 2017-05-03 VITALS — BP 114/72 | HR 93 | Temp 98.0°F | Wt 281.0 lb

## 2017-05-03 DIAGNOSIS — G8929 Other chronic pain: Secondary | ICD-10-CM | POA: Diagnosis not present

## 2017-05-03 DIAGNOSIS — M25562 Pain in left knee: Secondary | ICD-10-CM | POA: Diagnosis not present

## 2017-05-03 DIAGNOSIS — M25561 Pain in right knee: Secondary | ICD-10-CM | POA: Diagnosis not present

## 2017-05-03 MED ORDER — METHYLPREDNISOLONE ACETATE 80 MG/ML IJ SUSP: 80.0000 mg | Freq: Once | INTRAMUSCULAR | Status: DC

## 2017-05-03 MED ORDER — METHYLPREDNISOLONE ACETATE 80 MG/ML IJ SUSP
40.0000 mg | Freq: Once | INTRAMUSCULAR | Status: AC
  Administered 2017-05-03: 40 mg via INTRAMUSCULAR

## 2017-05-03 NOTE — Assessment & Plan Note (Addendum)
-   bilateral steroid injection given. - post procedure instructions given. - B/l knee xray to assess joint space.

## 2017-05-03 NOTE — Patient Instructions (Signed)
It was great to see you!  For your knee pain, - can take ibuprofen or tylenol as needed for pain - call the clinic if you develop redness, warmth or swelling of your knee.  You are due for a pap smear and should make an appointment for that soon.  Take care and seek immediate care sooner if you develop any concerns.  Rory Percy, DO [facility]   Knee Pain, Adult Many things can cause knee pain. The pain often goes away on its own with time and rest. If the pain does not go away, tests may be done to find out what is causing the pain. Follow these instructions at home: Activity  Rest your knee.  Do not do things that cause pain.  Avoid activities where both feet leave the ground at the same time (high-impact activities). Examples are running, jumping rope, and doing jumping jacks. General instructions  Take medicines only as told by your doctor.  Raise (elevate) your knee when you are resting. Make sure your knee is higher than your heart.  Sleep with a pillow under your knee.  If told, put ice on the knee: ? Put ice in a plastic bag. ? Place a towel between your skin and the bag. ? Leave the ice on for 20 minutes, 2-3 times a day.  Ask your doctor if you should wear an elastic knee support.  Lose weight if you are overweight. Being overweight can make your knee hurt more.  Do not use any tobacco products. These include cigarettes, chewing tobacco, or electronic cigarettes. If you need help quitting, ask your doctor. Smoking may slow down healing. Contact a doctor if:  The pain does not stop.  The pain changes or gets worse.  You have a fever along with knee pain.  Your knee gives out or locks up.  Your knee swells, and becomes worse. Get help right away if:  Your knee feels warm.  You cannot move your knee.  You have very bad knee pain.  You have chest pain.  You have trouble breathing. Summary  Many things can cause knee pain. The pain  often goes away on its own with time and rest.  Avoid activities that put stress on your knee. These include running and jumping rope.  Get help right away if you cannot move your knee, or if your knee feels warm, or if you have trouble breathing. This information is not intended to replace advice given to you by your health care provider. Make sure you discuss any questions you have with your health care provider. Document Released: 01/04/2009 Document Revised: 10/02/2016 Document Reviewed: 10/02/2016 Elsevier Interactive Patient Education  2017 [employer].

## 2017-05-03 NOTE — Progress Notes (Signed)
    Subjective:   Kimberly [name] is a 46 y.o. female with a history of obesity here for chronic bilateral knee pain.  She denies recent falls or trauma.  Has lost about 10 lbs since 02/2017.  She has been working out at [employer] but her knee pain has made it hard for her to recently.  She uses the Elliptical, bike, and weights.  It is particularly painful to climb stairs and her knees have been "giving way" and popping/clicking more often recently.  She received a steroid injection about 6-7 years ago that lasted for about 3 months and wants injections today.  She denies fever, chills.  ROS   Review of Systems:  Per HPI.   Social History: Current smoker  Objective:  BP 114/72   Pulse 93   Temp 98 F (36.7 C) (Oral)   Wt 281 lb (127.5 kg)   SpO2 96%   BMI 41.50 kg/m   Gen:  46 y.o. female in NAD HEENT: NCAT, MMM, anicteric sclerae CV: RRR, no MRG Resp: Non-labored, CTAB, no wheezes noted Ext: WWP MSK: Full ROM, strength intact, Crepitus present in both knee joints, gait normal.  No knee joint laxity. No pain to light palpation.  No joint effusion.  No erythema. Neuro: Alert and oriented, speech normal  INJECTION: Patient was given informed consent, signed copy in the chart. Appropriate time out was taken. Area prepped and draped in usual sterile fashion. 0.5cc of depo-medrol 80 mg/ml plus 4 cc of 1% lidocaine without epinephrine was injected into the medial patellar space using a(n) superior lateral approach. The patient tolerated the procedure well. There were no complications. Post procedure instructions were given.   Assessment & Plan:     Kimberly [name] is a 46 y.o. female here for   Chronic pain of both knees - bilateral steroid injection given. - post procedure instructions given. - B/l knee xray to assess joint space.   Rory Percy, DO  PGY-1,  [facility] 05/03/2017  3:52 PM

## 2017-05-08 DIAGNOSIS — J441 Chronic obstructive pulmonary disease with (acute) exacerbation: Secondary | ICD-10-CM | POA: Diagnosis not present

## 2017-05-17 ENCOUNTER — Ambulatory Visit: Payer: Medicare PPO | Admitting: Internal Medicine

## 2017-05-26 ENCOUNTER — Emergency Department (HOSPITAL_COMMUNITY)
Admission: EM | Admit: 2017-05-26 | Discharge: 2017-05-26 | Disposition: A | Discharge status: Home / Self Care | Payer: Medicare PPO | Attending: Emergency Medicine | Admitting: Emergency Medicine

## 2017-05-26 ENCOUNTER — Encounter (HOSPITAL_COMMUNITY): Payer: Self-pay | Admitting: *Deleted

## 2017-05-26 DIAGNOSIS — E119 Type 2 diabetes mellitus without complications: Secondary | ICD-10-CM | POA: Diagnosis not present

## 2017-05-26 DIAGNOSIS — F17210 Nicotine dependence, cigarettes, uncomplicated: Secondary | ICD-10-CM | POA: Diagnosis not present

## 2017-05-26 DIAGNOSIS — C50412 Malignant neoplasm of upper-outer quadrant of left female breast: Secondary | ICD-10-CM | POA: Insufficient documentation

## 2017-05-26 DIAGNOSIS — Z79899 Other long term (current) drug therapy: Secondary | ICD-10-CM | POA: Insufficient documentation

## 2017-05-26 DIAGNOSIS — J449 Chronic obstructive pulmonary disease, unspecified: Secondary | ICD-10-CM | POA: Insufficient documentation

## 2017-05-26 DIAGNOSIS — R2231 Localized swelling, mass and lump, right upper limb: Secondary | ICD-10-CM | POA: Diagnosis not present

## 2017-05-26 DIAGNOSIS — R229 Localized swelling, mass and lump, unspecified: Secondary | ICD-10-CM | POA: Diagnosis not present

## 2017-05-26 LAB — CBC WITH DIFFERENTIAL/PLATELET
Basophils Absolute: 0 10*3/uL (ref 0.0–0.1)
Basophils Relative: 1 %
Eosinophils Absolute: 0.1 10*3/uL (ref 0.0–0.7)
Eosinophils Relative: 1 %
HCT: 43.5 % (ref 36.0–46.0)
Hemoglobin: 14.5 g/dL (ref 12.0–15.0)
Lymphocytes Relative: 29 %
Lymphs Abs: 2.4 10*3/uL (ref 0.7–4.0)
MCH: 29.4 pg (ref 26.0–34.0)
MCHC: 33.3 g/dL (ref 30.0–36.0)
MCV: 88.2 fL (ref 78.0–100.0)
Monocytes Absolute: 1 10*3/uL (ref 0.1–1.0)
Monocytes Relative: 12 %
Neutro Abs: 4.7 10*3/uL (ref 1.7–7.7)
Neutrophils Relative %: 57 %
Platelets: 234 10*3/uL (ref 150–400)
RBC: 4.93 MIL/uL (ref 3.87–5.11)
RDW: 18 % — ABNORMAL HIGH (ref 11.5–15.5)
WBC: 8.2 10*3/uL (ref 4.0–10.5)

## 2017-05-26 LAB — BASIC METABOLIC PANEL
Anion gap: 10 (ref 5–15)
BUN: 7 mg/dL (ref 6–20)
CO2: 25 mmol/L (ref 22–32)
Calcium: 9.5 mg/dL (ref 8.9–10.3)
Chloride: 101 mmol/L (ref 101–111)
Creatinine, Ser: 0.91 mg/dL (ref 0.44–1.00)
GFR calc Af Amer: >60 mL/min (ref >60)
GFR calc non Af Amer: >60 mL/min (ref >60)
Glucose, Bld: 89 mg/dL (ref 65–99)
Potassium: 3.7 mmol/L (ref 3.5–5.1)
Sodium: 136 mmol/L (ref 135–145)

## 2017-05-26 MED ORDER — OXYCODONE-ACETAMINOPHEN 5-325 MG PO TABS
1.0000 | ORAL_TABLET | Freq: Once | ORAL | Status: AC
  Administered 2017-05-26: 1 via ORAL
  Filled 2017-05-26: qty 1

## 2017-05-26 MED ORDER — OXYCODONE-ACETAMINOPHEN 5-325 MG PO TABS
ORAL_TABLET | ORAL | Status: AC
  Filled 2017-05-26: qty 1

## 2017-05-26 MED ORDER — OXYCODONE-ACETAMINOPHEN 5-325 MG PO TABS
1.0000 | ORAL_TABLET | ORAL | Status: DC | PRN
  Administered 2017-05-26: 1 via ORAL

## 2017-05-26 MED ORDER — HYDROCODONE-ACETAMINOPHEN 5-325 MG PO TABS: 1.0000 | ORAL_TABLET | Freq: Four times a day (QID) | ORAL | 0 refills | Status: DC | PRN

## 2017-05-26 NOTE — Discharge Instructions (Signed)
Please call your primary care doctor for follow-up. They may be to arrange follow-up for ultrasound guided biopsy through the breast imaging center rather than through surgeon.  Please return without fail for worsening symptoms, including fever, increased redness/swelling, rapidly enlarging mass, or any other symptoms concerning to you.  Continue to ice. Take ibuprofen for pain. Use stronger pain medication for break through.

## 2017-05-26 NOTE — ED Provider Notes (Signed)
[facility] DEPT Provider Note   CSN: [id number] Arrival date & time: 05/26/17  1512     History   Chief Complaint Chief Complaint  Patient presents with  . Abscess    HPI Echo Propp is a 46 y.o. female.  The history is provided by the patient.  Illness  This is a new problem. The current episode started more than 2 days ago. The problem occurs constantly. The problem has been gradually worsening. Pertinent negatives include no chest pain, no abdominal pain and no shortness of breath. Exacerbated by: palpation and movement of the right arm. Nothing relieves the symptoms. She has tried a cold compress for the symptoms. The treatment provided mild relief.   46 year old female who presents with palpable mass under the right arm. This is been ongoing for the past 5 days, painful to touch and movement to the right arm. Pain mildly improved with ice pack and Percocet. States that she has had similar presentation in the past. States that 16 years ago she had a biopsy and excision showing benign process. States that 2 years ago she had a palpable mass under bilateral armpits, that had to be biopsied and 1 showing breast cancer. She subsequently underwent lumpectomy, lymph node dissection, and chemoradiation therapy. Currently is in remission. Denies any other lymphadenopathy. Nice fevers or chills, or injury.  Past Medical History:  Diagnosis Date  . Anemia   . Arthritis   . Bipolar 1 disorder ([place])   . Blood transfusion without reported diagnosis 2007   post bleeding childbirth  . Cancer ([place])    eval  lt breast cancer  . Diabetes mellitus without complication ([place])    gestational  . Heart murmur    as a child-not adult-no cardiac work up  . History of radiation therapy 08/31/14-10/21/14   left breast/ left supraclavicular 50.4 Gy 28 fx, lef tposterior axillary boost 9.52 Gy 28 fx, left rbeast boost/ 10 Gy 5 fx    Patient Active Problem List   Diagnosis Date Noted  .  Chronic pain of both knees 05/03/2017  . Upper airway cough syndrome 03/27/2017  . Discogenic low back pain 03/20/2017  . COPD (chronic obstructive pulmonary disease) ([place]) 03/20/2017  . Tachycardia   . Fall   . Dyspnea on exertion   . Grieving 11/21/2016  . Mixed incontinence 09/29/2015  . Genetic testing 08/26/2015  . Malignant neoplasm of upper-outer quadrant of left breast in female, estrogen receptor positive ([place]) 05/07/2014  . Disorder of bladder 09/29/2008  . LOW BACK PAIN, CHRONIC 09/29/2008  . Morbid (severe) obesity due to excess calories ([place]) 11/19/2007  . ANEMIA, IRON DEFICIENCY, CHRONIC 11/14/2007  . Mixed bipolar I disorder ([place]) 02/20/2007  . Cigarette smoker 02/20/2007    Past Surgical History:  Procedure Laterality Date  . BREAST BIOPSY Left 04/01/14  . BREAST BIOPSY Right 04/19/14  . BREAST LUMPECTOMY WITH AXILLARY LYMPH NODE DISSECTION Bilateral 05/03/14  . DILATION AND CURETTAGE OF UTERUS     after childbirth  . MULTIPLE TOOTH EXTRACTIONS     only 5 left  . PORTACATH PLACEMENT Right 06/18/2014   Procedure: INSERTION PORT-A-CATH;  Surgeon: [name], MD;  Location: [facility];  Service: General;  Laterality: Right;  . TONSILLECTOMY AND ADENOIDECTOMY      OB History    No data available       Home Medications    Prior to Admission medications   Medication Sig Start Date End Date Taking? Authorizing Provider  albuterol (PROVENTIL  HFA;VENTOLIN HFA) 108 (90 Base) MCG/ACT inhaler Inhale 2 puffs into the lungs every 6 (six) hours as needed for wheezing or shortness of breath. 02/25/17   [name], DO  anastrozole (ARIMIDEX) 1 MG tablet Take 1 tablet (1 mg total) by mouth daily. 03/09/17   Leeanne Rio, MD  clonazePAM (KLONOPIN) 1 MG tablet Take 1 mg by mouth 3 (three) times daily.  03/21/14   [provider]  cyclobenzaprine (FLEXERIL) 10 MG tablet Take 1 tablet (10 mg total) by mouth 3 (three) times daily as needed  for muscle spasms. 09/04/16   [name], DO  famotidine (PEPCID) 20 MG tablet One at bedtime 03/27/17   Tanda Rockers, MD  gabapentin (NEURONTIN) 300 MG capsule TAKE 1 CAPSULE TWICE DAILY 04/17/17   [name], DO  HYDROcodone-acetaminophen (NORCO/VICODIN) 5-325 MG tablet Take 1 tablet by mouth every 6 (six) hours as needed for severe pain. 05/26/17   Forde Dandy, MD  ipratropium-albuterol (DUONEB) 0.5-2.5 (3) MG/3ML SOLN Take 3 mLs by nebulization every 4 (four) hours as needed. 03/08/17   Mikell, Jeani Sow, MD  lamoTRIgine (LAMICTAL) 200 MG tablet Take 1 tablet (200 mg total) by mouth 2 (two) times daily. 07/25/15   Janora Norlander, DO  oxyCODONE-acetaminophen (PERCOCET) 10-325 MG tablet Take 1 tablet by mouth every 8 (eight) hours as needed for pain. 04/19/17   [name], DO  pantoprazole (PROTONIX) 40 MG tablet TAKE 1 TABLET DAILY BY MOUTH 30 TO 60 MINUTES BEFORE FIRST MEAL OF THE DAY 03/27/17   Tanda Rockers, MD  polyethylene glycol [facility]) packet Take 17 g by mouth daily. 12/03/16   McKeag, Marylynn Pearson, MD  prazosin (MINIPRESS) 2 MG capsule Take 2 mg by mouth at bedtime.  07/01/14   [provider]  QUEtiapine (SEROQUEL) 100 MG tablet Take 100 mg by mouth at bedtime.    [provider]  risperiDONE (RISPERDAL) 3 MG tablet Take 1 tablet (3 mg total) by mouth at bedtime. 11/16/16   [name], DO    Family History Family History  Problem Relation Age of Onset  . Heart disease Father   . Cancer Father        Prostate    Social History Social History  Substance Use Topics  . Smoking status: Current Every Day Smoker    Packs/day: 1.00    Years: 32.00    Types: Cigarettes  . Smokeless tobacco: Never Used  . Alcohol use No     Allergies   Patient has no known allergies.   Review of Systems Review of Systems  Constitutional: Negative for fever.  Respiratory: Negative for shortness of breath.   Cardiovascular: Negative for chest pain.    Gastrointestinal: Negative for abdominal pain.  All other systems reviewed and are negative.    Physical Exam Updated Vital Signs BP (!) 130/91   Pulse 97   Temp 98.9 F (37.2 C) (Oral)   Resp 18   SpO2 97%   Physical Exam Physical Exam  Nursing note and vitals reviewed. Constitutional: Well developed, well nourished, non-toxic, and in no acute distress Head: Normocephalic and atraumatic.  Mouth/Throat: Oropharynx is clear and moist.  Neck: Normal range of motion. Neck supple.  Cardiovascular: Normal rate and regular rhythm.   Pulmonary/Chest: Effort normal and breath sounds normal.  Abdominal: Soft. There is no tenderness. There is no rebound and no guarding.  Musculoskeletal: Normal range of motion.  Neurological: Alert, no facial droop, fluent  speech, moves all extremities symmetrically Skin: Skin is warm and dry. Palpable tender nodule under the right axilla. No lymphadenopathy in cervical chain, inguinal area, or left axilla Psychiatric: Cooperative   ED Treatments / Results  Labs (all labs ordered are listed, but only abnormal results are displayed) Labs Reviewed  CBC WITH DIFFERENTIAL/PLATELET - Abnormal; Notable for the following:       Result Value   RDW 18.0 (*)    All other components within normal limits  BASIC METABOLIC PANEL    EKG  EKG Interpretation None       Radiology No results found.  Procedures Procedures (including critical care time)  Medications Ordered in ED Medications  oxyCODONE-acetaminophen (PERCOCET/ROXICET) 5-325 MG per tablet 1 tablet (1 tablet Oral Given 05/26/17 1545)  oxyCODONE-acetaminophen (PERCOCET/ROXICET) 5-325 MG per tablet (not administered)  oxyCODONE-acetaminophen (PERCOCET/ROXICET) 5-325 MG per tablet 1 tablet (1 tablet Oral Given 05/26/17 1643)     Initial Impression / Assessment and Plan / ED Course  I have reviewed the triage vital signs and the nursing notes.  Pertinent labs & imaging results that were  available during my care of the patient were reviewed by me and considered in my medical decision making (see chart for details).     46 year old female with previous history of breast cancer who presents with axillary nodule enlarging over the past 5 days. She is afebrile, well-appearing. There is palpable nodule in the right axilla. No overlying skin changes to suggest cellulitis/obvious abscess. No other areas of possible lymphadenopathy on exam. Patient did express that she is unable to return to her surgeon who did her initial mass excision due to unpaid bills. Case management consult was placed to see if potentially assistance could be provided. Patient does have primary care doctor who she can follow up with who may be able to arrange ultrasound-guided biopsy through different method. Blood work is reassuring. Strict return and follow-up instructions reviewed. She expressed understanding of all discharge instructions and felt comfortable with the plan of care.   Final Clinical Impressions(s) / ED Diagnoses   Final diagnoses:  Axillary lump, right    New Prescriptions New Prescriptions   HYDROCODONE-ACETAMINOPHEN (NORCO/VICODIN) 5-325 MG TABLET    Take 1 tablet by mouth every 6 (six) hours as needed for severe pain.     Forde Dandy, MD 05/26/17 [phone]

## 2017-05-26 NOTE — ED Triage Notes (Signed)
To ED for eval of right axilla/arm pain and swelling for past couple of days. Noticed to have become bigger over the last day. Pt with hx of same which was cancer, per pt. Last cancer treatment was 2 years ago- which was last time anything like this was noted and prior to that was 16 yrs ago. No noted fevers at home.

## 2017-05-26 NOTE — ED Notes (Signed)
Patient is alert and orientedx4.  Patient was explained discharge instructions and they understood them with no questions.  The patient's friend, Seward Carol, is coming tor get the patient.

## 2017-05-27 ENCOUNTER — Ambulatory Visit (INDEPENDENT_AMBULATORY_CARE_PROVIDER_SITE_OTHER): Payer: Medicare PPO | Admitting: Internal Medicine

## 2017-05-27 ENCOUNTER — Encounter: Payer: Self-pay | Admitting: Internal Medicine

## 2017-05-27 VITALS — BP 102/78 | HR 115 | Temp 98.6°F | Wt 277.0 lb

## 2017-05-27 DIAGNOSIS — L02411 Cutaneous abscess of right axilla: Secondary | ICD-10-CM | POA: Diagnosis not present

## 2017-05-27 MED ORDER — DOXYCYCLINE HYCLATE 50 MG PO CAPS: 50.0000 mg | ORAL_CAPSULE | Freq: Two times a day (BID) | ORAL | 0 refills | Status: DC

## 2017-05-27 NOTE — Patient Instructions (Signed)
It was so nice to see you!  We drained two areas of your underarm today.  I have also prescribed some Doxycyline, which is an antibiotic. Please take 1 tablet twice a day for 7 days.  If you are not better after finishing the antibiotics, please come back to see [name]!  -Dr. Brett Albino

## 2017-05-28 ENCOUNTER — Telehealth: Payer: Self-pay | Admitting: Family Medicine

## 2017-05-28 NOTE — Telephone Encounter (Signed)
Pt is in extreme pain from the places that were lanced under her arm.  She would like some pain medicine.    [facility] on [address]

## 2017-05-29 ENCOUNTER — Other Ambulatory Visit: Payer: Self-pay | Admitting: Internal Medicine

## 2017-05-29 DIAGNOSIS — L02411 Cutaneous abscess of right axilla: Secondary | ICD-10-CM | POA: Insufficient documentation

## 2017-05-29 MED ORDER — HYDROCODONE-ACETAMINOPHEN 5-325 MG PO TABS: 1.0000 | ORAL_TABLET | Freq: Four times a day (QID) | ORAL | 0 refills | Status: DC | PRN

## 2017-05-29 NOTE — Assessment & Plan Note (Signed)
Patient seen in ED 8/5 and thought to have lymph node (which is concerning in a patient with a history of breast cancer), however it seems that masses have enlarged and patient has developed fevers and chills. Exam consistent with abscesses, as the masses are fluctuant, very tender to palpation, and now have a small amount of overlying cellulitis. - I&D performed in clinic today with drainage of yellow purulent material (also consistent with abscess). See procedure note below. - Will prescribe Doxycycline 50mg  bid x 7 days - Follow-up in clinic in 5-7 days to ensure resolution. If she has persistent masses, would need to consider biopsy to rule out breast cancer. - Patient also overdue for yearly mammogram. Last done in 12/2015 and did not show any evidence of cancer.

## 2017-05-29 NOTE — Telephone Encounter (Signed)
Patient given prescription for Percocet #10, placed at front desk. Follow-up appointment scheduled with Dr. Reesa Chew on 8/10 for recheck of axillary abscess. Called patient on the phone and let her know. She voiced understanding.

## 2017-05-29 NOTE — Progress Notes (Signed)
   [facility] Phone: [phone]  Subjective:  [name] is a 46 year old female presenting to clinic with lumps under her right armpit. The lumps started 5 days ago. The lumps are very painful. She was seen in the ED on 8/5, where there was concern that she may have an enlarging lymph node, particularly concerning in a patient with a history of breast cancer. It was thought that she might need ultrasound-guided biopsy to evaluate further. Patient was prescribed pain medication and discharged with PCP follow-up. In clinic, patient states that the lumps have enlarged a lot overnight. She has been having fevers and chills since leaving the ED. She had temperatures to 101F at home. She has been using warm compresses, which haven't helped.  ROS: See HPI for pertinent positives and negatives  Past Medical History- COPD, anemia, chronic low back pain, hx of left breast cancer, bipolar disorder, morbid obesity  Family history reviewed for today's visit. No changes.  Social history- patient is a current smoker  Objective: BP 102/78   Pulse (!) 115   Temp 98.6 F (37 C) (Oral)   Wt 277 lb (125.6 kg)   SpO2 96%   BMI 40.91 kg/m  Gen: NAD, alert, cooperative with exam Msk: Two fluctuant masses palpated in the right axilla, each measuring 2cm x 1cm. Masses are exquisitely tender to palpation. Small amount of overlying erythema present.  Assessment/Plan: Abscess of right axilla: Patient seen in ED 8/5 and thought to have lymph node (which is concerning in a patient with a history of breast cancer), however it seems that masses have enlarged and patient has developed fevers and chills. Exam consistent with abscesses, as the masses are fluctuant, very tender to palpation, and now have a small amount of overlying cellulitis. - I&D performed in clinic today with drainage of yellow purulent material (also consistent with abscess). See procedure note below. - Will prescribe  Doxycycline 50mg  bid x 7 days - Follow-up in clinic in 5-7 days to ensure resolution. If she has persistent masses, would need to consider biopsy to rule out breast cancer. - Patient also overdue for yearly mammogram. Last done in 12/2015 and did not show any evidence of cancer.  I&D Procedure Note: The procedure, risks and complications have been discussed in detail (including, but not limited to infection and bleeding) with the patient, and the patient has given verbal consent to the procedure.  The skin was prepped with Betadine and alcohol. After adequate local anesthesia with 5cc of lidocaine with 1% epinephrine, I&D was performed on the two abscesses as described in physical exam section. Purulent drainage: present.  Blunt dissection was used to break up loculations. Wound culture was not sent. A sterile pressure dressing was applied. The patient tolerated the procedure well. The patient was given aftercare instructions and started on antibiotics as listed above.  Hyman Bible, MD PGY-3

## 2017-05-31 ENCOUNTER — Ambulatory Visit (INDEPENDENT_AMBULATORY_CARE_PROVIDER_SITE_OTHER): Payer: Medicare PPO | Admitting: Family Medicine

## 2017-05-31 ENCOUNTER — Encounter: Payer: Self-pay | Admitting: Family Medicine

## 2017-05-31 VITALS — BP 122/82 | HR 110 | Temp 98.3°F | Ht 69.0 in | Wt 227.0 lb

## 2017-05-31 DIAGNOSIS — L02411 Cutaneous abscess of right axilla: Secondary | ICD-10-CM

## 2017-05-31 MED ORDER — HYDROCODONE-ACETAMINOPHEN 5-325 MG PO TABS
1.0000 | ORAL_TABLET | Freq: Four times a day (QID) | ORAL | 0 refills | Status: DC | PRN
Start: 2017-05-31 — End: 2018-04-15

## 2017-05-31 NOTE — Assessment & Plan Note (Addendum)
Able to drain much of the abcess today with simple expression. Will pack with cotton strips and have pt remove about 1 cm a day unit approximately 1 cm remains. Have pt return in 1 week to re-evaluate. Continue doxycycline.

## 2017-05-31 NOTE — Patient Instructions (Signed)
It was a pleasure to see you today! Thank you for choosing [facility] for your primary care. Kimberly [name] was seen for right underarm abcess. Please continue to remove packing strip 1 cm a day, until approximately 1 cm remains.  Repack wound as shown if material comes out. Finish your antibiotic course. Come back to the clinic in 1 week to follow up.  Best,  Marny Lowenstein, MD, [place] - PGY1 05/31/2017 3:26 PM

## 2017-05-31 NOTE — Progress Notes (Signed)
   Subjective:   Patient ID: Kimberly [name]    DOB: [birth year], 46 y.o. female   MRN: [id number]  CC: f/u axillary abscess  HPI: Kimberly [name] is a 46 y.o. female who presents to clinic today for f/u on axillary abscess.  Right Axillary abscess:  Received Vicodin in ED on 8/5.  Underwent I+D on 8/6 and was prescribed doxycycline.  Has been taking it as prescribed and has 4 days left.  Additionally, received Percocet 10 tabs on 8/8 due to ongoing pain.  Has 5 pills left but is going on vacation till 8/17 and feels like she will be in excruciating pain if she runs out.  Today she states she is in a lot of pain and notes a lot of swelling. Feels like overall it has improved from initial visit.  One fever yesterday evening to 101.  Took Motrin and Ibuprofen which brought it down.  Has been using warm and cold compresses over the area.  Cold feels better than the warm.  Does not think the lumps have enlarged.   ROS: Denies palpitations, CP, SOB.  Denies nausea, vomiting, abdominal pain. [place]: Pertinent past medical, surgical, family, and social history were reviewed and updated as appropriate. Smoking status reviewed. Medications reviewed.   Objective:   BP 122/82   Pulse (!) 104   Temp 98 F (36.7 C) (Oral)   Ht 5\' 9"  (1.753 m)   Wt 277 lb (125.6 kg)   SpO2 95%   BMI 40.91 kg/m  Vitals and nursing note reviewed.  Gen: 46 yo f, NAD, alert Msk: Small area of overlying erythema in right axilla.  No palpable  induration or fluctuant masses however the area is exquisitely tender. No drainage or open lesions present.   Assessment & Plan:   Abscess of axilla, right Improving on antibiotics.  Continue doxycycline.  Patient going on beach vacation with family until next week and concerned regarding pain level as area remains exquisitely tender to palpation.  -Rx for Hydrocodone, tabs #10 for severe pain  -Recommend avoid going in swimming pools and water until area is healed  -Tylenol  as needed for fever -Return precautions discussed -Patient instructed to seek medical care if ongoing fevers, worsening swelling or pain noted. She expressed good understanding of this.  -F/u in 1 week after her vacation to ensure abscess is resolving   Meds ordered this encounter  Medications  . HYDROcodone-acetaminophen (NORCO/VICODIN) 5-325 MG tablet    Sig: Take 1 tablet by mouth every 6 (six) hours as needed for severe pain.    Dispense:  10 tablet    Refill:  0   Follow up: 1 week   Lovenia Kim, MD [place], PGY-2 06/07/2017 1:00 AM

## 2017-05-31 NOTE — Progress Notes (Signed)
    Subjective:  Kimberly [name] is a 46 y.o. female who presents to the [facility] today with a chief complaint of underarm abcess.   HPI: Pt diagnosed with skin abscess on 08/05 in ED. S/p I&D on 08/06 in office. Pt has had continued pain and drainage from the site. Pt on day 4 of 7 of doxycycline. Earlier today, pt lifted up arm and had drainage "shoot across the room." She says that her pain significantly improved after this. Denies fevers, chills.   ROS: Per HPI  Objective:  Physical Exam: BP 122/82   Pulse (!) 110   Temp 98.3 F (36.8 C) (Oral)   Ht 5\' 9"  (1.753 m)   Wt 227 lb (103 kg)   SpO2 96%   BMI 33.52 kg/m   Skin: skin abscess at Right axilla s/p I&D, copious amounts of purulent fluid drainage. Mild erythema around abscess. No signs over underlying cellulitis. Abscess tracks approximately 2 cm.   No results found for this or any previous visit (from the past 72 hour(s)).   Assessment/Plan:  Abscess of axilla, right Able to drain much of the abcess today with simple expression. Will pack with cotton strips and have pt remove about 1 cm a day unit approximately 1 cm remains. Have pt return in 1 week to re-evaluate. Continue doxycycline.    Marny Lowenstein, MD, MS FAMILY MEDICINE RESIDENT - PGY1 05/31/2017 4:40 PM

## 2017-05-31 NOTE — Patient Instructions (Addendum)
It was nice to meet you! You were seen in clinic for follow-up of your abscess in right underarm area.  You should continue the full course of current antibiotics as prescribed.  You did not have a fever in clinic this morning but you can take Tylenol or Ibuprofen every 6 hours as needed.  If you have continued fevers or worsening pain, swelling of your infection, I would recommend seeing a doctor again.    As we discussed, I would like you to make an appointment in 1 week after your vacation on 8/17 to follow up again and make sure this is improving.  In the meantime, please call clinic if you have any questions.    Be well,  Lovenia Kim, MD

## 2017-06-07 NOTE — Assessment & Plan Note (Addendum)
Improving on antibiotics.  Continue doxycycline.  Patient going on beach vacation with family until next week and concerned regarding pain level as area remains exquisitely tender to palpation.  -Rx for Hydrocodone, tabs #10 for severe pain  -Recommend avoid going in swimming pools and water until area is healed  -Tylenol as needed for fever -Return precautions discussed -Patient instructed to seek medical care if ongoing fevers, worsening swelling or pain noted. She expressed good understanding of this.  -F/u in 1 week after her vacation to ensure abscess is resolving

## 2017-06-08 DIAGNOSIS — J441 Chronic obstructive pulmonary disease with (acute) exacerbation: Secondary | ICD-10-CM | POA: Diagnosis not present

## 2017-06-13 ENCOUNTER — Ambulatory Visit: Payer: Medicare PPO | Admitting: Internal Medicine

## 2017-06-14 ENCOUNTER — Encounter: Payer: Self-pay | Admitting: Family Medicine

## 2017-06-14 ENCOUNTER — Ambulatory Visit (INDEPENDENT_AMBULATORY_CARE_PROVIDER_SITE_OTHER): Payer: Medicare PPO | Admitting: Family Medicine

## 2017-06-14 VITALS — BP 128/82 | HR 102 | Temp 98.0°F | Ht 69.0 in | Wt 272.8 lb

## 2017-06-14 DIAGNOSIS — L02411 Cutaneous abscess of right axilla: Secondary | ICD-10-CM | POA: Diagnosis not present

## 2017-06-14 DIAGNOSIS — L0293 Carbuncle, unspecified: Secondary | ICD-10-CM

## 2017-06-14 MED ORDER — SULFAMETHOXAZOLE-TRIMETHOPRIM 800-160 MG PO TABS: 1.0000 | ORAL_TABLET | Freq: Two times a day (BID) | ORAL | 0 refills | Status: DC

## 2017-06-14 NOTE — Patient Instructions (Signed)
Kimberly [name], you were seen today for your ongoing drainage from your arm infection.  I want to try another antibiotic called bactrim for 10 days.   If you get any worsening pain, increased drainage, fevers, chills, swelling or worsening redness in your arm please make a same day visit to be seen in clinic.  Otherwise follow up in 10 days.   Very nice seeing you today, [name] L. Rosalyn Gess, [address] Medicine Resident PGY-2 06/14/2017 5:18 PM

## 2017-06-18 NOTE — Progress Notes (Signed)
    Subjective:  Kimberly [name] is a 46 y.o. female who presents to the [facility] today for recurrent drainage from abscess s/p ID and course of doxycycline  HPI:  Axillary abscess, right Had I&D by Dr. Brett Albino on 8/6 and has since completed course of doxycycline. Seen last week and had additional pus expressed and packed. Patient has since removed packing per instructions. Denies fevers, chills, but has persistent drainage and area is exquisitely TTP. No pictures available to track progress. Patient feels it is not getting better, but not getting worse. No documented history of MRSA and no cultures sent.   Tobacco use reviewed Medication: reviewed and updated ROS: see HPI   Objective:  Physical Exam: BP 128/82   Pulse (!) 102   Temp 98 F (36.7 C) (Oral)   Ht 5\' 9"  (1.753 m)   Wt 272 lb 12.8 oz (123.7 kg)   SpO2 98%   BMI 40.29 kg/m   Gen: 46yo F in NAD, resting comfortably Skin: Single fluctuant mass measuring approx 2x1cm with overlying erythema and TTP. No warmth or visible drainage. Copious amounts of yellow purulent material easily expressed with minimal pressure Neuro: grossly normal, moves all extremities Psych: Normal affect and thought content  No results found for this or any previous visit (from the past 72 hour(s)).   Assessment/Plan:  Abscess of axilla, right Continues with pain and drainage. Expressed copious amounts of yellow purulent material with minimal pressure. Patient reports no fevers or chills. No need for IV abx at this time. Patient already has follow up scheduled with PCP on 9/6.  - will try course of bactrim DS x10 days - return precautions discussed. Follow up with PCP at scheduled appointment

## 2017-06-18 NOTE — Assessment & Plan Note (Signed)
Continues with pain and drainage. Expressed copious amounts of yellow purulent material with minimal pressure. Patient reports no fevers or chills. No need for IV abx at this time. Patient already has follow up scheduled with PCP on 9/6.  - will try course of bactrim DS x10 days - return precautions discussed. Follow up with PCP at scheduled appointment

## 2017-06-25 NOTE — Progress Notes (Deleted)
   Subjective:   Patient ID: Kimberly [name]    DOB: [birth year], 46 y.o. female   MRN: [id number]  Kimberly [name] is a 46 y.o. female with a history of breast cancer s/p lumpectomy*** here for routine PAP and checkup.***  No LMP recorded. Patient is not currently having periods (Reason: Perimenopausal). Last PAP - 02/25/2014, due Last mammogram - 12/23/2015, due Last A1C - 5.5 03/06/17 Flu shot  Fasting lipid panel to assess need for statin Smoking status?  ***  Review of Systems:  Per HPI.  ROS:  Feeling well. No dyspnea or chest pain on exertion.  No abdominal pain, change in bowel habits, black or bloody stools.  No urinary tract symptoms.  GYN ROS: {gyn ros:315267::"normal menses, no abnormal bleeding, pelvic pain or discharge","no breast pain or new or enlarging lumps on self exam"}. No neurological complaints.  [place]: ***. Smoking status reviewed. Medications reviewed.  Objective:   There were no vitals taken for this visit. Vitals and nursing note reviewed.  General: well nourished, well developed, in no acute distress with non-toxic appearance HEENT: normocephalic, atraumatic, moist mucous membranes Neck: supple, non-tender without lymphadenopathy CV: regular rate and rhythm without murmurs, rubs, or gallops, no lower extremity edema Lungs: clear to auscultation bilaterally with normal work of breathing Abdomen: soft, non-tender, non-distended, no masses or organomegaly palpable, normoactive bowel sounds Skin: warm, dry, no rashes or lesions, cap refill < 2 seconds Extremities: warm and well perfused, normal tone MSK: Full ROM, strength intact, gait normal Neuro: Alert and oriented, speech normal  BREAST EXAM: {pe breast exam:315056::"breasts appear normal, no suspicious masses, no skin or nipple changes or axillary nodes"}  PELVIC EXAM: {pelvic exam:315900::"normal external genitalia, vulva, vagina, cervix, uterus and adnexa"}  Assessment & Plan:   No  problem-specific Assessment & Plan notes found for this encounter.  No orders of the defined types were placed in this encounter.  No orders of the defined types were placed in this encounter.   ASSESSMENT:  {gyn assessment:315268::"well woman"}  PLAN:  {gyn plan:315269::"mammogram","pap smear","return annually or prn"}  Rory Percy, DO PGY-1, [facility] 06/25/2017 7:32 PM

## 2017-06-27 ENCOUNTER — Ambulatory Visit: Payer: Medicare PPO | Admitting: Family Medicine

## 2017-07-09 DIAGNOSIS — J441 Chronic obstructive pulmonary disease with (acute) exacerbation: Secondary | ICD-10-CM | POA: Diagnosis not present

## 2017-07-25 ENCOUNTER — Ambulatory Visit: Payer: Medicare PPO

## 2017-07-29 ENCOUNTER — Ambulatory Visit: Payer: Medicare PPO

## 2017-08-08 DIAGNOSIS — J441 Chronic obstructive pulmonary disease with (acute) exacerbation: Secondary | ICD-10-CM | POA: Diagnosis not present

## 2017-09-08 DIAGNOSIS — J441 Chronic obstructive pulmonary disease with (acute) exacerbation: Secondary | ICD-10-CM | POA: Diagnosis not present

## 2017-09-23 ENCOUNTER — Ambulatory Visit (INDEPENDENT_AMBULATORY_CARE_PROVIDER_SITE_OTHER): Payer: Medicare PPO | Admitting: *Deleted

## 2017-09-23 DIAGNOSIS — Z23 Encounter for immunization: Secondary | ICD-10-CM

## 2017-10-08 DIAGNOSIS — J441 Chronic obstructive pulmonary disease with (acute) exacerbation: Secondary | ICD-10-CM | POA: Diagnosis not present

## 2017-10-25 ENCOUNTER — Ambulatory Visit (INDEPENDENT_AMBULATORY_CARE_PROVIDER_SITE_OTHER): Payer: Medicare PPO | Admitting: Family Medicine

## 2017-10-25 ENCOUNTER — Encounter: Payer: Self-pay | Admitting: Family Medicine

## 2017-10-25 VITALS — BP 115/70 | HR 90 | Temp 98.0°F | Ht 69.0 in | Wt 268.4 lb

## 2017-10-25 DIAGNOSIS — N950 Postmenopausal bleeding: Secondary | ICD-10-CM | POA: Insufficient documentation

## 2017-10-25 DIAGNOSIS — L02411 Cutaneous abscess of right axilla: Secondary | ICD-10-CM

## 2017-10-25 MED ORDER — DOXYCYCLINE HYCLATE 100 MG PO TABS: 100.0000 mg | ORAL_TABLET | Freq: Two times a day (BID) | ORAL | 0 refills | Status: DC

## 2017-10-25 NOTE — Patient Instructions (Addendum)
Get a transvaginal ultrasound. Make an appointment with your doctor to discuss the next steps.  Antibiotic doxycycline twice a day. Referral to surgeon placed today, let us know if you have not heard back after a few weeks.  Hidradenitis Suppurativa Hidradenitis suppurativa is a long-term (chronic) skin disease that starts with blocked sweat glands or hair follicles. Bacteria may grow in these blocked openings of your skin. Hidradenitis suppurativa is like a severe form of acne that develops in areas of your body where acne would be unusual. It is most likely to affect the areas of your body where skin rubs against skin and becomes moist. This includes your:  Underarms.  Groin.  Genital areas.  Buttocks.  Upper thighs.  Breasts.  Hidradenitis suppurativa may start out with small pimples. The pimples can develop into deep sores that break open (rupture) and drain pus. Over time your skin may thicken and become scarred. Hidradenitis suppurativa cannot be passed from person to person. What are the causes? The exact cause of hidradenitis suppurativa is not known. This condition may be due to:  Female and female hormones. The condition is rare before and after puberty.  An overactive body defense system (immune system). Your immune system may overreact to the blocked hair follicles or sweat glands and cause swelling and pus-filled sores.  What increases the risk? You may have a higher risk of hidradenitis suppurativa if you:  Are a woman.  Are between ages 29 and 32.  Have a family history of hidradenitis suppurativa.  Have a personal history of acne.  Are overweight.  Smoke.  Take the drug lithium.  What are the signs or symptoms? The first signs of an outbreak are usually painful skin bumps that look like pimples. As the condition progresses:  Skin bumps may get bigger and grow deeper into the skin.  Bumps under the skin may rupture and drain smelly pus.  Skin may become  itchy and infected.  Skin may thicken and scar.  Drainage may continue through tunnels under the skin (fistulas).  Walking and moving your arms can become painful.  How is this diagnosed? Your health care provider may diagnose hidradenitis suppurativa based on your medical history and your signs and symptoms. A physical exam will also be done. You may need to see a health care provider who specializes in skin diseases (dermatologist). You may also have tests done to confirm the diagnosis. These can include:  Swabbing a sample of pus or drainage from your skin so it can be sent to the lab and tested for infection.  Blood tests to check for infection.  How is this treated? The same treatment will not work for everybody with hidradenitis suppurativa. Your treatment will depend on how severe your symptoms are. You may need to try several treatments to find what works best for you. Part of your treatment may include cleaning and bandaging (dressing) your wounds. You may also have to take medicines, such as the following:  Antibiotics.  Acne medicines.  Medicines to block or suppress the immune system.  A diabetes medicine (metformin) is sometimes used to treat this condition.  For women, birth control pills can sometimes help relieve symptoms.  You may need surgery if you have a severe case of hidradenitis suppurativa that does not respond to medicine. Surgery may involve:  Using a laser to clear the skin and remove hair follicles.  Opening and draining deep sores.  Removing the areas of skin that are diseased and scarred.  Follow these instructions at home:  Learn as much as you can about your disease, and work closely with your health care providers.  Take medicines only as directed by your health care provider.  If you were prescribed an antibiotic medicine, finish it all even if you start to feel better.  If you are overweight, losing weight may be very helpful. Try to  reach and maintain a healthy weight.  Do not use any tobacco products, including cigarettes, chewing tobacco, or electronic cigarettes. If you need help quitting, ask your health care provider.  Do not shave the areas where you get hidradenitis suppurativa.  Do not wear deodorant.  Wear loose-fitting clothes.  Try not to overheat and get sweaty.  Take a daily bleach bath as directed by your health care provider. ? Fill your bathtub halfway with water. ? Pour in  cup of unscented household bleach. ? Soak for 5-10 minutes.  Cover sore areas with a warm, clean washcloth (compress) for 5-10 minutes. Contact a health care provider if:  You have a flare-up of hidradenitis suppurativa.  You have chills or a fever.  You are having trouble controlling your symptoms at home. This information is not intended to replace advice given to you by your health care provider. Make sure you discuss any questions you have with your health care provider. Document Released: 05/22/2004 Document Revised: 03/15/2016 Document Reviewed: 01/08/2014 Elsevier Interactive Patient Education  2018 [employer].

## 2017-10-25 NOTE — Assessment & Plan Note (Signed)
Patient started with post menopausal bleeding today. Advised that she should make a follow up with her PCP to discuss further but that needed to start the work up with a transvaginal ultrasound to evaluate uterine lining for hyperplasia.

## 2017-10-25 NOTE — Progress Notes (Signed)
    Subjective:  Kimberly [name] is a 47 y.o. female who presents to the [facility] today with a chief complaint of R axilla abscess and vaginal bleeding.   HPI:  R axilla abscess Has been ongoing for 7 months, has needed several rounds of antibiotics for this, last was back in August.  She states that over the last couple of months it never fully healed and has continued to drain daily however acutely worsened over the last 3 days with more pain and redness and swelling and increased foul odor in addition to her chronic drainage. She has also had subjective fevers at home.  She would like to see a specialist for this as she feels  very frustrated that this has never completely resolved.  Vaginal bleeding She states that she has had no period for past 11 years and starting having bleeding today. No recent intercourse. No vaginal discharge or itching.      ROS: Per HPI  Objective:  Physical Exam: BP 115/70   Pulse 90   Temp 98 F (36.7 C) (Oral)   Ht 5\' 9"  (1.753 m)   Wt 268 lb 6.4 oz (121.7 kg)   SpO2 99%   BMI 39.64 kg/m   Gen: NAD, resting comfortably Skin: Right axilla with a 2 cm fluctuant pocket deep and mid axillary fold with a shallow 1 cm open area that is draining purulent malodorous fluid.  See picture below.      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  Purulent drainage able to be expressed manually however deeper pocket of fluctuance not appropriate for incision and drainage.  Patient is nontoxic-appearing with stable vitals.  Treat with doxycycline twice daily and referred to general surgery.  Given return precautions  Postmenopausal bleeding Patient started with post menopausal bleeding today. Advised that she should make a follow up with her PCP to discuss further but that needed to start the work up with a transvaginal ultrasound to evaluate uterine lining for hyperplasia.   Bufford Lope, DO PGY-2,  [facility] 10/25/2017 3:44 PM

## 2017-10-25 NOTE — Assessment & Plan Note (Signed)
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  Purulent drainage able to be expressed manually however deeper pocket of fluctuance not appropriate for incision and drainage.  Patient is nontoxic-appearing with stable vitals.  Treat with doxycycline twice daily and referred to general surgery.  Given return precautions

## 2017-10-31 ENCOUNTER — Ambulatory Visit (HOSPITAL_COMMUNITY): Admission: RE | Admit: 2017-10-31 | Payer: Medicare PPO | Source: Ambulatory Visit

## 2017-11-06 ENCOUNTER — Other Ambulatory Visit: Payer: Self-pay | Admitting: Family Medicine

## 2017-11-06 DIAGNOSIS — N950 Postmenopausal bleeding: Secondary | ICD-10-CM

## 2017-11-06 NOTE — Progress Notes (Unsigned)
Previous order unable to be used for scheduling due to account issue, new order placed as per scheduler request.

## 2017-11-08 DIAGNOSIS — J441 Chronic obstructive pulmonary disease with (acute) exacerbation: Secondary | ICD-10-CM | POA: Diagnosis not present

## 2017-12-09 DIAGNOSIS — J441 Chronic obstructive pulmonary disease with (acute) exacerbation: Secondary | ICD-10-CM | POA: Diagnosis not present

## 2018-01-03 IMAGING — CR DG HIP (WITH OR WITHOUT PELVIS) 1V PORT*R*
3 series · 3 of 3 positions shown · non-contrast
Comparison: None.

CLINICAL DATA: 46-year-old female status post fall 2 days ago with
right hip pain.

EXAM:
DG HIP (WITH OR WITHOUT PELVIS) 1V PORT RIGHT

[AP (1 of 2)]
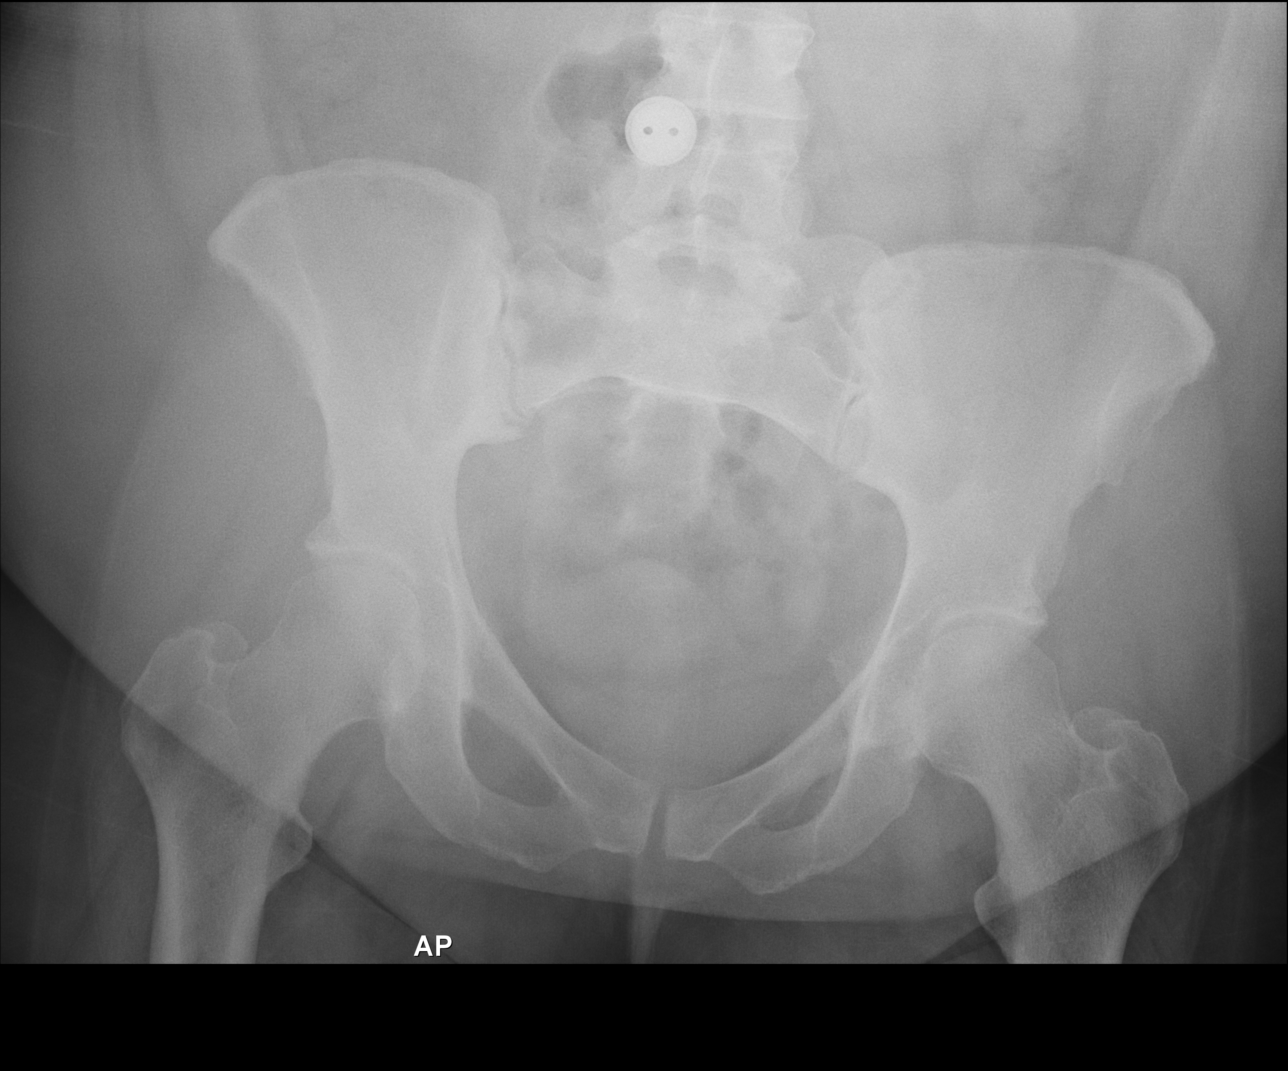

[AP (2 of 2)]
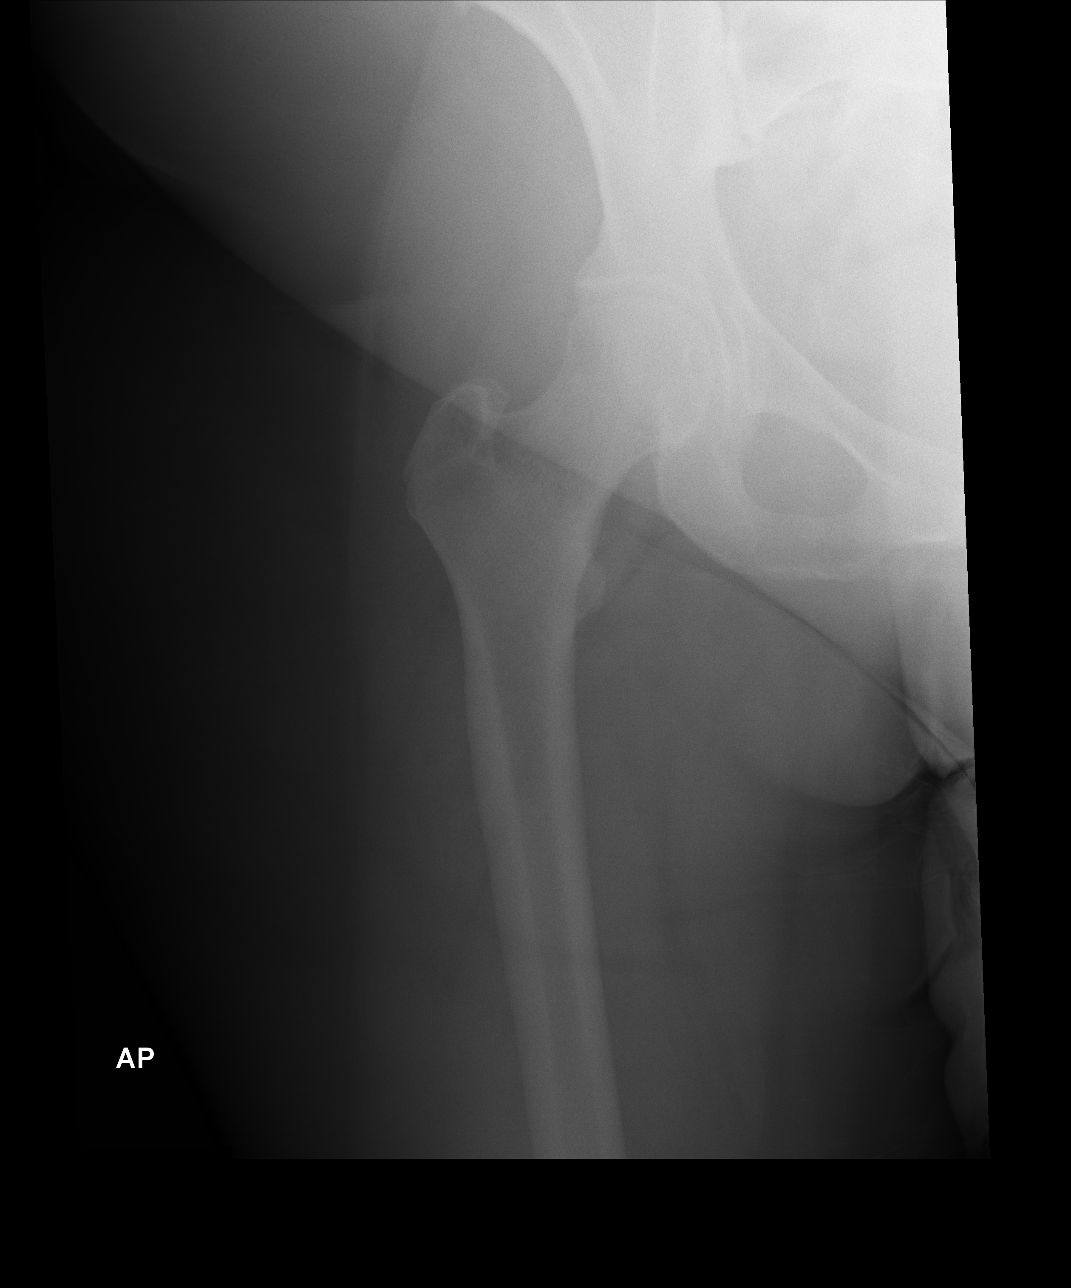

[lateral]
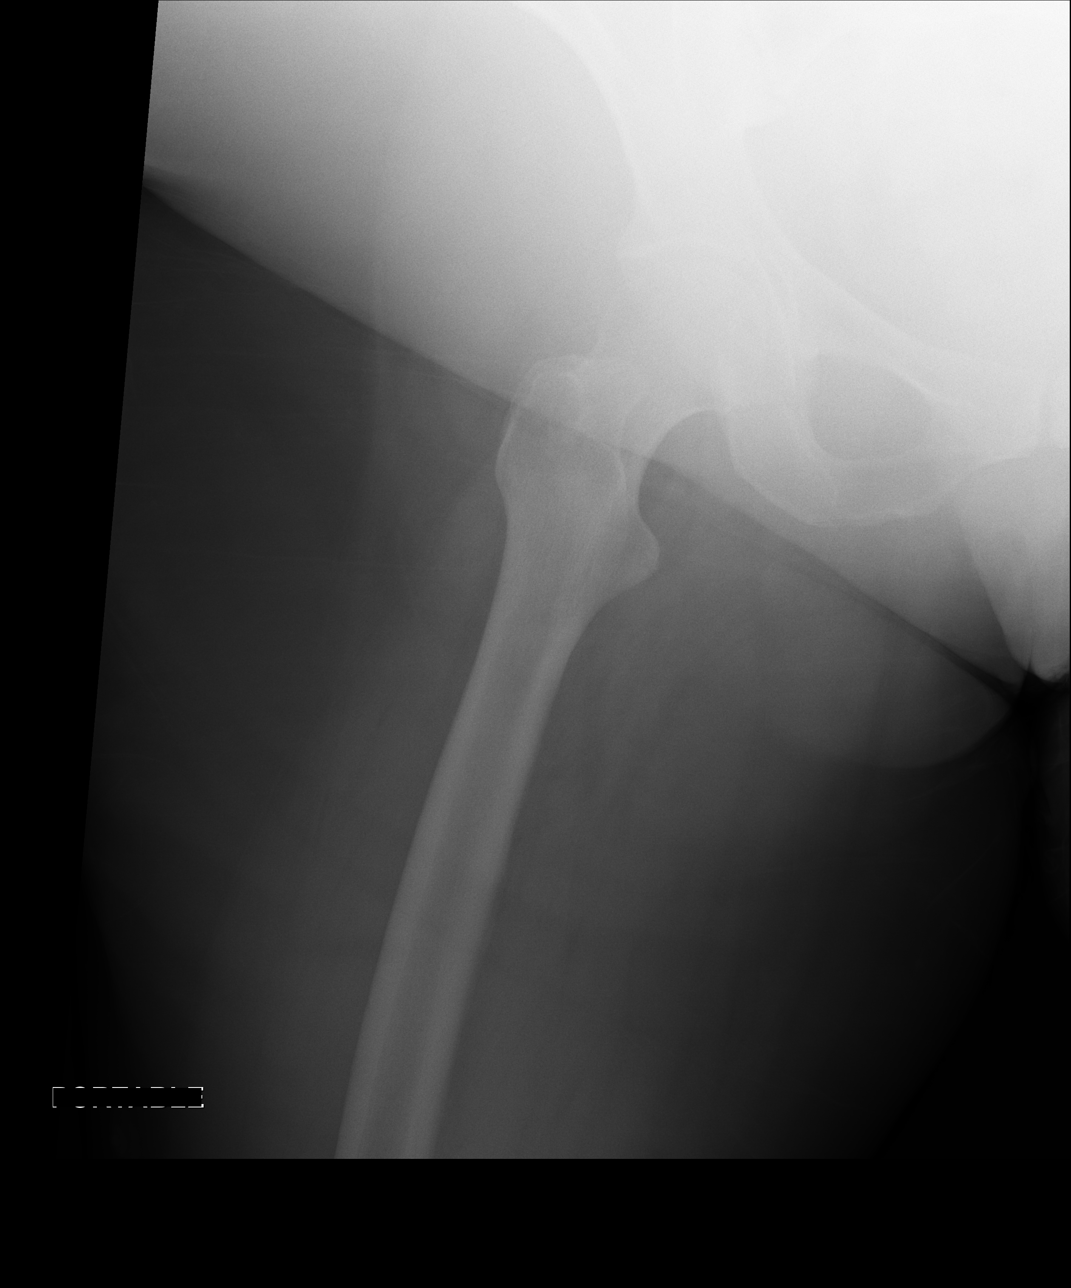

[3 of 3 positions shown; findings below may reference images not displayed]

FINDINGS: AP portable supine views of the right hip and pelvis. Button
artifact projects over the lower lumbar spine. Bone mineralization
is within normal limits. Femoral heads are normally located. Hip
joint spaces appear symmetric and within normal limits. No pelvis
fracture identified. SI joints appear normal. Grossly intact
proximal left femur. The proximal right femur appears intact and
within normal limits.
IMPRESSION: No osseous abnormality identified about the right hip or pelvis.

## 2018-01-06 DIAGNOSIS — J441 Chronic obstructive pulmonary disease with (acute) exacerbation: Secondary | ICD-10-CM | POA: Diagnosis not present

## 2018-01-31 ENCOUNTER — Other Ambulatory Visit: Payer: Self-pay | Admitting: Obstetrics and Gynecology

## 2018-02-06 DIAGNOSIS — J441 Chronic obstructive pulmonary disease with (acute) exacerbation: Secondary | ICD-10-CM | POA: Diagnosis not present

## 2018-03-02 NOTE — Progress Notes (Deleted)
   Subjective:   Patient ID: Kimberly [name]    DOB: 11-Apr-1971, 47 y.o. female   MRN: [id number]  Kimberly [name] is a 47 y.o. female with a history of COPD, morbid obesity, Bipolar d/o, chronic pain here for   Extremity numbness/shoulder pain - last A1c 5.5 02/2017  Healthcare Maintenance - Pap Smear: last 02/2014, due - A1c: 5.5 02/2017 - Lipid Panel: last 2015, due  Review of Systems:  Per HPI.   [place]: reviewed. Smoking status reviewed. Medications reviewed.  Objective:   There were no vitals taken for this visit. Vitals and nursing note reviewed.  General: well nourished, well developed, in no acute distress with non-toxic appearance HEENT: normocephalic, atraumatic, moist mucous membranes Neck: supple, non-tender without lymphadenopathy CV: regular rate and rhythm without murmurs, rubs, or gallops, no lower extremity edema Lungs: clear to auscultation bilaterally with normal work of breathing Abdomen: soft, non-tender, non-distended, no masses or organomegaly palpable, normoactive bowel sounds Skin: warm, dry, no rashes or lesions Extremities: warm and well perfused, normal tone MSK: ROM grossly intact, strength intact, gait normal Neuro: Alert and oriented, speech normal  Assessment & Plan:   No problem-specific Assessment & Plan notes found for this encounter.  No orders of the defined types were placed in this encounter.  No orders of the defined types were placed in this encounter.   Rory Percy, DO PGY-1, [facility] 03/02/2018 10:13 AM

## 2018-03-03 ENCOUNTER — Ambulatory Visit: Payer: Medicare PPO | Admitting: Family Medicine

## 2018-03-05 ENCOUNTER — Telehealth: Payer: Self-pay | Admitting: Family Medicine

## 2018-03-05 NOTE — Telephone Encounter (Signed)
Pt called and is requesting a referral to [facility]. She called them to set up an appointment and they said she would need a referral first. Please advise

## 2018-03-05 NOTE — Telephone Encounter (Signed)
Will forward to MD to advise. Sharayah Renfrow,CMA  

## 2018-03-07 NOTE — Telephone Encounter (Signed)
Patient will need an appointment to discuss further. Thanks! Rory Percy, DO PGY-1, [facility] 03/07/2018 6:56 PM

## 2018-03-08 DIAGNOSIS — J441 Chronic obstructive pulmonary disease with (acute) exacerbation: Secondary | ICD-10-CM | POA: Diagnosis not present

## 2018-03-10 NOTE — Telephone Encounter (Signed)
Patient voiced understanding and appointment made 03-24-18. Audia Amick,CMA

## 2018-03-23 NOTE — Progress Notes (Signed)
   Subjective:   Patient ID: Kimberly [name]    DOB: 10-12-1971, 47 y.o. female   MRN: [id number]  Kimberly [name] is a 47 y.o. female with a history of COPD, morbid obesity, Mixed bipolar I d/o here for   Psych referral Patient previously seen at [facility] but regular provider left recently and has not seen new provider, was told it would take a month to be reestablished with different provider. She is requesting referral to [facility]. On clonazepam TID which she has been out of for about a month. States she is noticing a huge difference since she has been without it with frequent panic attacks and anxiety. States she often does not want to do anything, often doesn't shower. Cannot bring herself to cook meals for her family.   GAD 7 : Generalized Anxiety Score 03/24/2018  Nervous, Anxious, on Edge 3  Control/stop worrying 3  Worry too much - different things 3  Trouble relaxing 3  Restless 3  Easily annoyed or irritable 3  Afraid - awful might happen 3  Total GAD 7 Score 21  Anxiety Difficulty Extremely difficult    Depression screen PHQ 2/9 03/24/2018  Decreased Interest 3  Down, Depressed, Hopeless 3  PHQ - 2 Score 6  Altered sleeping 3  Tired, decreased energy 3  Change in appetite 3  Feeling bad or failure about yourself  3  Trouble concentrating 3  Moving slowly or fidgety/restless 2  Suicidal thoughts 1  PHQ-9 Score 24  Difficult doing work/chores Extremely dIfficult  Some recent data might be hidden    Review of Systems:  Per HPI.   [place]: reviewed. Smoking status reviewed. Medications reviewed.  Objective:   BP 124/72   Pulse 94   Temp (!) 97.5 F (36.4 C) (Oral)   Ht 5\' 9"  (1.753 m)   Wt 128.4 kg (283 lb)   SpO2 95%   BMI 41.79 kg/m  Vitals and nursing note reviewed.  General: obese female, tearful on exam and in acute distress, disheveled appearance.  HEENT: normocephalic, atraumatic, moist mucous membranes CV: regular rate and rhythm  without murmurs, rubs, or gallops Lungs: clear to auscultation bilaterally with normal work of breathing Extremities: warm and well perfused, normal tone MSK: ROM grossly intact, strength intact, gait normal Neuro: Alert and oriented, speech normal  Assessment & Plan:   Mixed bipolar I disorder (HCC) Anxiety and panic attacks have increased in frequency and severity since the passing of her husband. GAD and PHQ9 today shows significant distress. Patient has been without clonazepam for about 1 month, will refill enough until she is able to be reestablished with [facility] where she will need ongoing medication management and counseling. Warm handoff completed today, see separate note. Advised patient to return if further needs arise before she is able to be seen at [facility].  No orders of the defined types were placed in this encounter.  Meds ordered this encounter  Medications  . clonazePAM (KLONOPIN) 1 MG tablet    Sig: Take 1 tablet (1 mg total) by mouth 3 (three) times daily.    Dispense:  30 tablet    Refill:  0    Rory Percy, DO PGY-1, [facility] 03/24/2018 12:05 PM

## 2018-03-24 ENCOUNTER — Other Ambulatory Visit: Payer: Self-pay

## 2018-03-24 ENCOUNTER — Ambulatory Visit (INDEPENDENT_AMBULATORY_CARE_PROVIDER_SITE_OTHER): Payer: Medicare PPO | Admitting: Family Medicine

## 2018-03-24 ENCOUNTER — Encounter: Payer: Self-pay | Admitting: Family Medicine

## 2018-03-24 ENCOUNTER — Encounter: Payer: Self-pay | Admitting: Psychology

## 2018-03-24 VITALS — BP 124/72 | HR 94 | Temp 97.5°F | Ht 69.0 in | Wt 283.0 lb

## 2018-03-24 DIAGNOSIS — F3160 Bipolar disorder, current episode mixed, unspecified: Secondary | ICD-10-CM | POA: Diagnosis not present

## 2018-03-24 MED ORDER — CLONAZEPAM 1 MG PO TABS: 1.0000 mg | ORAL_TABLET | Freq: Three times a day (TID) | ORAL | 0 refills | Status: DC

## 2018-03-24 NOTE — Progress Notes (Signed)
Jazmin from [facility] requested a Behavioral Health Consult due to the distress that the patient was in.  Presenting Issue:  Patient reports she is needing a referral to [facility].  Says Dr. Silvio Pate left the [facility] and she can't see a NP there for about a month.  Is wanting a change in her medications, thinking that they are no longer working.  Upon further questioning, she feels like the receptionist and some other people she interacts with there are not kind to her.  She is interested in going to [facility].  Report of symptoms:  Overwhelmed.  Biopsychosocial picture is complex with significant stressors in every space.  Long standing history of mental health issues.  Did not get into specifics today.  Medications: She reports the following: Seroquel 400 mg RIsperidone 3 mg Lamictal 200 mg Prazosin: 5 mg Clonazepam:  1 mg  PHQ-9:  24 GAD-7:  21  Warmhandoff:    Warm Hand Off Completed.

## 2018-03-24 NOTE — Assessment & Plan Note (Addendum)
Patient was tearful and reported feeling overwhelmed.  Denied active suicidal or homicidal ideation.  Obtained a release of information and called the [facility].  Spoke to [employer].  She provided the following information:  Confirmed the medications as Ms. Dake stated them.  However, hasn't been seen at the [facility] since 12/2016.  Did not show for follow-up 01/21/2017 and requested and was granted a refill in 03/2017.  This was reported as a year's worth of everything except Clonazepam which was 6 months.  Dr. Ky Barban checked database.  No red flags there.  Ms. Delpriore says she has been out of Clonazepam about a month.  Pam at [facility] said they have openings with NPs this and next week.  Ms. Acero chart has been "closed" secondary to lack of follow-up both with counseling and the psychiatric component.  She said she would be willing to reopen but patient needs to understand close follow-up (q three months with NP and more frequent with counselor) would be necessary.  She can call and speak to [facility] for this appointment.      Jazmin called [facility] to see their time frame and to see what payment would be (she may have to pay up front to be seen and this could be problematic).  We want to provider Ms. Storey with her options.  She left a VM.    Provided Dr. Ky Barban this information and Ms. Owens Shark as well.  Close follow-up via phone may be necessary to ensure patient gets reconnected.

## 2018-03-24 NOTE — Assessment & Plan Note (Addendum)
Anxiety and panic attacks have increased in frequency and severity since the passing of her husband. GAD and PHQ9 today shows significant distress. Patient has been without clonazepam for about 1 month, will refill enough until she is able to be reestablished with [facility] where she will need ongoing medication management and counseling. Warm handoff completed today, see separate note. Advised patient to return if further needs arise before she is able to be seen at [facility].

## 2018-03-24 NOTE — Patient Instructions (Signed)
I have discussed your medications with Dr. Ky Barban.  She will let you know what she can do there.  I spoke with Pam at the [facility].  She was very helpful AND very concerned about you.  She said that you had challenges following up regularly there (as you said yourself) and that regular follow-up is necessary for you to continue to get care there.  It may be the best option if [facility] has a long wait list or is too costly.  Pam said you could call to schedule.  She will let Gwen know to re-open your chart.  Jazmin called [facility] to see their time frame and to see what payment would be.  She left a VM and will call you with what she finds out.

## 2018-03-26 DIAGNOSIS — F3013 Manic episode, severe, without psychotic symptoms: Secondary | ICD-10-CM | POA: Diagnosis not present

## 2018-03-27 DIAGNOSIS — F3013 Manic episode, severe, without psychotic symptoms: Secondary | ICD-10-CM | POA: Diagnosis not present

## 2018-04-03 ENCOUNTER — Telehealth: Payer: Self-pay

## 2018-04-03 ENCOUNTER — Other Ambulatory Visit: Payer: Self-pay | Admitting: Family Medicine

## 2018-04-03 MED ORDER — CLONAZEPAM 1 MG PO TABS: 1.0000 mg | ORAL_TABLET | Freq: Three times a day (TID) | ORAL | 0 refills | Status: DC

## 2018-04-03 NOTE — Telephone Encounter (Signed)
Refill provided until can be seen by [facility]. Please let patient know.

## 2018-04-03 NOTE — Telephone Encounter (Signed)
Pt calling to ask Dr. Ky Barban to refill Clonazepam. Can't get appt at [facility] unily 04/22/2018. Please let pt know if Dr. Ky Barban is sending the refill to [facility]. Call pt at [phone]. Ottis Stain, CMA

## 2018-04-03 NOTE — Telephone Encounter (Signed)
Patient informed.  Jazmin Hartsell,CMA  
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
## 2018-04-15 ENCOUNTER — Other Ambulatory Visit: Payer: Self-pay

## 2018-04-15 ENCOUNTER — Encounter: Payer: Self-pay | Admitting: Family Medicine

## 2018-04-15 ENCOUNTER — Ambulatory Visit (INDEPENDENT_AMBULATORY_CARE_PROVIDER_SITE_OTHER): Payer: 59 | Admitting: Family Medicine

## 2018-04-15 VITALS — BP 110/74 | HR 111 | Temp 97.8°F | Ht 69.0 in | Wt 273.2 lb

## 2018-04-15 DIAGNOSIS — G8929 Other chronic pain: Secondary | ICD-10-CM

## 2018-04-15 DIAGNOSIS — M5442 Lumbago with sciatica, left side: Secondary | ICD-10-CM

## 2018-04-15 DIAGNOSIS — M5441 Lumbago with sciatica, right side: Secondary | ICD-10-CM | POA: Diagnosis not present

## 2018-04-15 NOTE — Assessment & Plan Note (Addendum)
Acute on chronic back pain that has been present with progressive worsening for about 2 months. MRI last year with some mild spinal stenosis and disc bulge but without major stenosis or impingement. Exam reassuring, do not see the need for repeat imaging at this time. Counseled on continued use of gabapentin and using tylenol and ibuprofen together. Counseled on continued weight loss to relieve strain. Given no chronic use of narcotics, will not start at this time for chronic pain. PT referral made. Red flags discussed. RTC if no better with conservative measures.

## 2018-04-15 NOTE — Assessment & Plan Note (Signed)
Contributing to chronic back pain. Patient has lost some weight over the past few years but no recent weight loss of significant degree over the last year. Counseled on diet and exercise changes to lose weight. Unfortunately, mental health instability provides a barrier as well. Hopefully will see some progress once able to reestablish with [facility].

## 2018-04-22 DIAGNOSIS — F3013 Manic episode, severe, without psychotic symptoms: Secondary | ICD-10-CM | POA: Diagnosis not present

## 2018-04-22 DIAGNOSIS — F900 Attention-deficit hyperactivity disorder, predominantly inattentive type: Secondary | ICD-10-CM | POA: Diagnosis not present

## 2018-04-22 DIAGNOSIS — F4310 Post-traumatic stress disorder, unspecified: Secondary | ICD-10-CM | POA: Diagnosis not present

## 2018-04-22 DIAGNOSIS — F250 Schizoaffective disorder, bipolar type: Secondary | ICD-10-CM | POA: Diagnosis not present

## 2018-04-22 DIAGNOSIS — F411 Generalized anxiety disorder: Secondary | ICD-10-CM | POA: Diagnosis not present

## 2018-04-23 DIAGNOSIS — F3013 Manic episode, severe, without psychotic symptoms: Secondary | ICD-10-CM | POA: Diagnosis not present

## 2018-04-28 DIAGNOSIS — F250 Schizoaffective disorder, bipolar type: Secondary | ICD-10-CM | POA: Diagnosis not present

## 2018-04-28 DIAGNOSIS — R739 Hyperglycemia, unspecified: Secondary | ICD-10-CM | POA: Diagnosis not present

## 2018-04-28 DIAGNOSIS — E039 Hypothyroidism, unspecified: Secondary | ICD-10-CM | POA: Diagnosis not present

## 2018-04-28 DIAGNOSIS — E569 Vitamin deficiency, unspecified: Secondary | ICD-10-CM | POA: Diagnosis not present

## 2018-05-22 DIAGNOSIS — F3013 Manic episode, severe, without psychotic symptoms: Secondary | ICD-10-CM | POA: Diagnosis not present

## 2018-05-23 DIAGNOSIS — Z6841 Body Mass Index (BMI) 40.0 and over, adult: Secondary | ICD-10-CM | POA: Diagnosis not present

## 2018-05-23 DIAGNOSIS — M545 Low back pain: Secondary | ICD-10-CM | POA: Diagnosis not present

## 2018-05-23 DIAGNOSIS — M1990 Unspecified osteoarthritis, unspecified site: Secondary | ICD-10-CM | POA: Diagnosis not present

## 2018-05-23 DIAGNOSIS — F319 Bipolar disorder, unspecified: Secondary | ICD-10-CM | POA: Diagnosis not present

## 2018-05-23 DIAGNOSIS — F209 Schizophrenia, unspecified: Secondary | ICD-10-CM | POA: Diagnosis not present

## 2018-05-23 DIAGNOSIS — H9190 Unspecified hearing loss, unspecified ear: Secondary | ICD-10-CM | POA: Diagnosis not present

## 2018-05-23 DIAGNOSIS — F17210 Nicotine dependence, cigarettes, uncomplicated: Secondary | ICD-10-CM | POA: Diagnosis not present

## 2018-05-23 DIAGNOSIS — F418 Other specified anxiety disorders: Secondary | ICD-10-CM | POA: Diagnosis not present

## 2018-06-24 DIAGNOSIS — F411 Generalized anxiety disorder: Secondary | ICD-10-CM | POA: Diagnosis not present

## 2018-06-24 DIAGNOSIS — F250 Schizoaffective disorder, bipolar type: Secondary | ICD-10-CM | POA: Diagnosis not present

## 2018-06-24 DIAGNOSIS — F900 Attention-deficit hyperactivity disorder, predominantly inattentive type: Secondary | ICD-10-CM | POA: Diagnosis not present

## 2018-06-24 DIAGNOSIS — F4310 Post-traumatic stress disorder, unspecified: Secondary | ICD-10-CM | POA: Diagnosis not present

## 2018-06-24 DIAGNOSIS — F3013 Manic episode, severe, without psychotic symptoms: Secondary | ICD-10-CM | POA: Diagnosis not present

## 2018-07-22 NOTE — Progress Notes (Deleted)
  Subjective:   Patient ID: Kimberly [name]    DOB: 04/02/1971, 47 y.o. female   MRN: [id number]  Kimberly [name] is a 47 y.o. female with a history of COPD, chronic knee pain, morbid obesity, bipolar d/o, chronic LBP here for   Knee pain - Seen 04/2017 for similar, received bilateral knee injections ***  Healthcare Maintenance - Vaccines: flu - Pap Smear: due - Lipid Panel: due (not on statin)  Review of Systems:  Per HPI.  [place], medications and smoking status reviewed.  Objective:   There were no vitals taken for this visit. Vitals and nursing note reviewed.  General: well nourished, well developed, in no acute distress with non-toxic appearance HEENT: normocephalic, atraumatic, moist mucous membranes Neck: supple, non-tender without lymphadenopathy CV: regular rate and rhythm without murmurs, rubs, or gallops, no lower extremity edema Lungs: clear to auscultation bilaterally with normal work of breathing Abdomen: soft, non-tender, non-distended, no masses or organomegaly palpable, normoactive bowel sounds Skin: warm, dry, no rashes or lesions Extremities: warm and well perfused, normal tone MSK: ROM grossly intact, strength intact, gait normal Neuro: Alert and oriented, speech normal  Assessment & Plan:   No problem-specific Assessment & Plan notes found for this encounter.  No orders of the defined types were placed in this encounter.  No orders of the defined types were placed in this encounter.   Rory Percy, DO PGY-2, [facility] 07/22/2018 6:43 PM

## 2018-07-23 ENCOUNTER — Ambulatory Visit: Payer: 59 | Admitting: Family Medicine

## 2018-08-25 DIAGNOSIS — F4312 Post-traumatic stress disorder, chronic: Secondary | ICD-10-CM | POA: Diagnosis not present

## 2018-08-25 DIAGNOSIS — F250 Schizoaffective disorder, bipolar type: Secondary | ICD-10-CM | POA: Diagnosis not present

## 2018-08-25 DIAGNOSIS — F411 Generalized anxiety disorder: Secondary | ICD-10-CM | POA: Diagnosis not present

## 2018-08-25 DIAGNOSIS — F3013 Manic episode, severe, without psychotic symptoms: Secondary | ICD-10-CM | POA: Diagnosis not present

## 2018-08-25 DIAGNOSIS — F902 Attention-deficit hyperactivity disorder, combined type: Secondary | ICD-10-CM | POA: Diagnosis not present

## 2018-09-03 DIAGNOSIS — F3013 Manic episode, severe, without psychotic symptoms: Secondary | ICD-10-CM | POA: Diagnosis not present

## 2018-09-29 DIAGNOSIS — F902 Attention-deficit hyperactivity disorder, combined type: Secondary | ICD-10-CM | POA: Diagnosis not present

## 2018-09-29 DIAGNOSIS — F4312 Post-traumatic stress disorder, chronic: Secondary | ICD-10-CM | POA: Diagnosis not present

## 2018-09-29 DIAGNOSIS — F411 Generalized anxiety disorder: Secondary | ICD-10-CM | POA: Diagnosis not present

## 2018-09-29 DIAGNOSIS — F250 Schizoaffective disorder, bipolar type: Secondary | ICD-10-CM | POA: Diagnosis not present

## 2018-10-06 ENCOUNTER — Ambulatory Visit (INDEPENDENT_AMBULATORY_CARE_PROVIDER_SITE_OTHER): Payer: Medicare PPO | Admitting: *Deleted

## 2018-10-06 DIAGNOSIS — Z23 Encounter for immunization: Secondary | ICD-10-CM

## 2018-10-27 DIAGNOSIS — F902 Attention-deficit hyperactivity disorder, combined type: Secondary | ICD-10-CM | POA: Diagnosis not present

## 2018-10-27 DIAGNOSIS — F3013 Manic episode, severe, without psychotic symptoms: Secondary | ICD-10-CM | POA: Diagnosis not present

## 2018-10-27 DIAGNOSIS — F4312 Post-traumatic stress disorder, chronic: Secondary | ICD-10-CM | POA: Diagnosis not present

## 2018-10-27 DIAGNOSIS — F250 Schizoaffective disorder, bipolar type: Secondary | ICD-10-CM | POA: Diagnosis not present

## 2018-11-03 DIAGNOSIS — F3013 Manic episode, severe, without psychotic symptoms: Secondary | ICD-10-CM | POA: Diagnosis not present

## 2018-11-03 DIAGNOSIS — F250 Schizoaffective disorder, bipolar type: Secondary | ICD-10-CM | POA: Diagnosis not present

## 2018-11-03 DIAGNOSIS — F902 Attention-deficit hyperactivity disorder, combined type: Secondary | ICD-10-CM | POA: Diagnosis not present

## 2018-11-03 DIAGNOSIS — F4312 Post-traumatic stress disorder, chronic: Secondary | ICD-10-CM | POA: Diagnosis not present

## 2018-11-17 DIAGNOSIS — F902 Attention-deficit hyperactivity disorder, combined type: Secondary | ICD-10-CM | POA: Diagnosis not present

## 2018-11-17 DIAGNOSIS — F4312 Post-traumatic stress disorder, chronic: Secondary | ICD-10-CM | POA: Diagnosis not present

## 2018-11-17 DIAGNOSIS — F250 Schizoaffective disorder, bipolar type: Secondary | ICD-10-CM | POA: Diagnosis not present

## 2018-12-01 DIAGNOSIS — F1020 Alcohol dependence, uncomplicated: Secondary | ICD-10-CM | POA: Diagnosis not present

## 2018-12-01 DIAGNOSIS — F4312 Post-traumatic stress disorder, chronic: Secondary | ICD-10-CM | POA: Diagnosis not present

## 2018-12-01 DIAGNOSIS — F1110 Opioid abuse, uncomplicated: Secondary | ICD-10-CM | POA: Diagnosis not present

## 2018-12-01 DIAGNOSIS — F1220 Cannabis dependence, uncomplicated: Secondary | ICD-10-CM | POA: Diagnosis not present

## 2018-12-01 DIAGNOSIS — F902 Attention-deficit hyperactivity disorder, combined type: Secondary | ICD-10-CM | POA: Diagnosis not present

## 2018-12-01 DIAGNOSIS — F3013 Manic episode, severe, without psychotic symptoms: Secondary | ICD-10-CM | POA: Diagnosis not present

## 2018-12-01 DIAGNOSIS — F250 Schizoaffective disorder, bipolar type: Secondary | ICD-10-CM | POA: Diagnosis not present

## 2019-01-13 ENCOUNTER — Telehealth (INDEPENDENT_AMBULATORY_CARE_PROVIDER_SITE_OTHER): Payer: Medicare PPO | Admitting: Family Medicine

## 2019-01-13 ENCOUNTER — Telehealth: Payer: Self-pay | Admitting: Family Medicine

## 2019-01-13 ENCOUNTER — Other Ambulatory Visit: Payer: Self-pay

## 2019-01-13 ENCOUNTER — Other Ambulatory Visit: Payer: Self-pay | Admitting: Family Medicine

## 2019-01-13 DIAGNOSIS — K921 Melena: Secondary | ICD-10-CM | POA: Diagnosis not present

## 2019-01-13 DIAGNOSIS — R52 Pain, unspecified: Secondary | ICD-10-CM

## 2019-01-13 MED ORDER — MELOXICAM 7.5 MG PO TABS: 7.5000 mg | ORAL_TABLET | Freq: Every day | ORAL | 0 refills | Status: AC
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
## 2019-01-13 NOTE — Assessment & Plan Note (Signed)
Reported per patient and has been going on for years.  Unclear if this is truly blood as patient reports it is sometimes dark but also sometimes bright red, mixed in the stool as well as drops on the toilet paper.  Given age and prior cancer history, cannot rule out malignancy, will place order for colonoscopy although not likely to be done soon given current coronavirus outbreak.  Will assess when patient returns for follow-up, at that time could consider obtaining FOBT.
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
## 2019-01-13 NOTE — Telephone Encounter (Signed)
Patient called returning Dr.Rumballs earlier this evening. Please call patient back at [phone].

## 2019-02-01 DIAGNOSIS — M158 Other polyosteoarthritis: Secondary | ICD-10-CM | POA: Diagnosis not present

## 2019-02-01 DIAGNOSIS — F17200 Nicotine dependence, unspecified, uncomplicated: Secondary | ICD-10-CM | POA: Diagnosis not present

## 2019-02-01 DIAGNOSIS — F209 Schizophrenia, unspecified: Secondary | ICD-10-CM | POA: Diagnosis not present

## 2019-02-01 DIAGNOSIS — F418 Other specified anxiety disorders: Secondary | ICD-10-CM | POA: Diagnosis not present

## 2019-02-01 DIAGNOSIS — G4700 Insomnia, unspecified: Secondary | ICD-10-CM | POA: Diagnosis not present

## 2019-02-01 DIAGNOSIS — F4321 Adjustment disorder with depressed mood: Secondary | ICD-10-CM | POA: Diagnosis not present

## 2019-02-01 DIAGNOSIS — M545 Low back pain: Secondary | ICD-10-CM | POA: Diagnosis not present

## 2019-02-01 DIAGNOSIS — F3131 Bipolar disorder, current episode depressed, mild: Secondary | ICD-10-CM | POA: Diagnosis not present

## 2019-02-01 DIAGNOSIS — F909 Attention-deficit hyperactivity disorder, unspecified type: Secondary | ICD-10-CM | POA: Diagnosis not present

## 2019-02-24 DIAGNOSIS — F1110 Opioid abuse, uncomplicated: Secondary | ICD-10-CM | POA: Diagnosis not present

## 2019-02-24 DIAGNOSIS — F1020 Alcohol dependence, uncomplicated: Secondary | ICD-10-CM | POA: Diagnosis not present

## 2019-02-24 DIAGNOSIS — F250 Schizoaffective disorder, bipolar type: Secondary | ICD-10-CM | POA: Diagnosis not present

## 2019-02-24 DIAGNOSIS — F902 Attention-deficit hyperactivity disorder, combined type: Secondary | ICD-10-CM | POA: Diagnosis not present

## 2019-02-24 DIAGNOSIS — F3013 Manic episode, severe, without psychotic symptoms: Secondary | ICD-10-CM | POA: Diagnosis not present

## 2019-02-24 DIAGNOSIS — F1220 Cannabis dependence, uncomplicated: Secondary | ICD-10-CM | POA: Diagnosis not present

## 2019-02-24 DIAGNOSIS — F4312 Post-traumatic stress disorder, chronic: Secondary | ICD-10-CM | POA: Diagnosis not present

## 2019-03-06 ENCOUNTER — Ambulatory Visit (HOSPITAL_COMMUNITY)
Admission: EM | Admit: 2019-03-06 | Discharge: 2019-03-06 | Disposition: A | Discharge status: Home / Self Care | Payer: Medicare PPO | Attending: Family Medicine | Admitting: Family Medicine

## 2019-03-06 ENCOUNTER — Encounter (HOSPITAL_COMMUNITY): Payer: Self-pay

## 2019-03-06 ENCOUNTER — Ambulatory Visit (INDEPENDENT_AMBULATORY_CARE_PROVIDER_SITE_OTHER): Payer: Medicare PPO | Admitting: Family Medicine

## 2019-03-06 ENCOUNTER — Other Ambulatory Visit: Payer: Self-pay

## 2019-03-06 ENCOUNTER — Ambulatory Visit (INDEPENDENT_AMBULATORY_CARE_PROVIDER_SITE_OTHER): Payer: Medicare PPO

## 2019-03-06 ENCOUNTER — Encounter: Payer: Self-pay | Admitting: Family Medicine

## 2019-03-06 DIAGNOSIS — S62366A Nondisplaced fracture of neck of fifth metacarpal bone, right hand, initial encounter for closed fracture: Secondary | ICD-10-CM

## 2019-03-06 DIAGNOSIS — S6991XA Unspecified injury of right wrist, hand and finger(s), initial encounter: Secondary | ICD-10-CM

## 2019-03-06 DIAGNOSIS — S62316A Displaced fracture of base of fifth metacarpal bone, right hand, initial encounter for closed fracture: Secondary | ICD-10-CM | POA: Diagnosis not present

## 2019-03-06 DIAGNOSIS — F3013 Manic episode, severe, without psychotic symptoms: Secondary | ICD-10-CM | POA: Diagnosis not present

## 2019-03-06 MED ORDER — TRAMADOL HCL 50 MG PO TABS: 50.0000 mg | ORAL_TABLET | Freq: Four times a day (QID) | ORAL | 0 refills | Status: AC | PRN

## 2019-03-06 NOTE — ED Provider Notes (Signed)
[facility]    CSN: [id number] Arrival date & time: 03/06/19  1703     History   Chief Complaint Chief Complaint  Patient presents with  . Hand Pain    HPI Kimberly [name] is a 48 y.o. female.   HPI   She states that several bricks fell on her right hand while she was working in the garden yesterday.  It is very swollen, bruised, and painful.  She saw her primary care physician today.  He recommended she come here for x-rays and pain management. [facility]  She also is under the care of psychiatry for her mental illness.  She states that everything is stable.  Past Medical History:  Diagnosis Date  . Anemia   . Arthritis   . Bipolar 1 disorder ([place])   . Blood transfusion without reported diagnosis 2007   post bleeding childbirth  . Cancer ([place])    eval  lt breast cancer  . Diabetes mellitus without complication ([place])    gestational  . Heart murmur    as a child-not adult-no cardiac work up  . History of radiation therapy 08/31/14-10/21/14   left breast/ left supraclavicular 50.4 Gy 28 fx, lef tposterior axillary boost 9.52 Gy 28 fx, left rbeast boost/ 10 Gy 5 fx    Patient Active Problem List   Diagnosis Date Noted  . Total body pain 01/13/2019  . Blood in stool 01/13/2019  . Postmenopausal bleeding 10/25/2017  . Abscess of axilla, right 05/29/2017  . Chronic pain of both knees 05/03/2017  . Upper airway cough syndrome 03/27/2017  . Discogenic low back pain 03/20/2017  . COPD (chronic obstructive pulmonary disease) ([place]) 03/20/2017  . Tachycardia   . Dyspnea on exertion   . Grieving 11/21/2016  . Mixed incontinence 09/29/2015  . Genetic testing 08/26/2015  . Malignant neoplasm of upper-outer quadrant of left breast in female, estrogen receptor positive ([place]) 05/07/2014  . Disorder of bladder 09/29/2008  . LOW BACK PAIN, CHRONIC 09/29/2008  . Morbid (severe) obesity due to excess  calories ([place]) 11/19/2007  . ANEMIA, IRON DEFICIENCY, CHRONIC 11/14/2007  . Mixed bipolar I disorder ([place]) 02/20/2007  . Cigarette smoker 02/20/2007    Past Surgical History:  Procedure Laterality Date  . BREAST BIOPSY Left 04/01/14  . BREAST BIOPSY Right 04/19/14  . BREAST LUMPECTOMY WITH AXILLARY LYMPH NODE DISSECTION Bilateral 05/03/14  . DILATION AND CURETTAGE OF UTERUS     after childbirth  . MULTIPLE TOOTH EXTRACTIONS     only 5 left  . PORTACATH PLACEMENT Right 06/18/2014   Procedure: INSERTION PORT-A-CATH;  Surgeon: [name], MD;  Location: [facility];  Service: General;  Laterality: Right;  . TONSILLECTOMY AND ADENOIDECTOMY      OB History   No obstetric history on file.      Home Medications    Prior to Admission medications   Medication Sig Start Date End Date Taking? Authorizing Provider  albuterol (PROVENTIL HFA;VENTOLIN HFA) 108 (90 Base) MCG/ACT inhaler Inhale 2 puffs into the lungs every 6 (six) hours as needed for wheezing or shortness of breath. 02/25/17   [name], DO  ALPRAZolam (XANAX XR) 2 MG 24 hr tablet TAKE 1 TABLET BY MOUTH IN THE  MORNING FOR ANXIETY. 10/31/18   [provider]  amphetamine-dextroamphetamine (ADDERALL) 15 MG tablet TK 1 T PO BID FOR ADD 09/15/18   [provider]  busPIRone (BUSPAR) 15 MG tablet  TK 1 T PO TID 10/27/18   [provider]  clonazePAM (KLONOPIN) 1 MG tablet TK 1 T PO ONCE TO BID PRF ANXIETY. 10/28/18   [provider]  doxepin (SINEQUAN) 50 MG capsule TK 1 C PO Q NIGHT 12/18/18   [provider]  FLUoxetine (PROZAC) 40 MG capsule TK 2 CS PO ONCE D 10/27/18   [provider]  ipratropium-albuterol (DUONEB) 0.5-2.5 (3) MG/3ML SOLN Take 3 mLs by nebulization every 4 (four) hours as needed. 03/08/17   Mikell, Jeani Sow, MD  lamoTRIgine (LAMICTAL) 200 MG tablet Take 1 tablet (200 mg total) by mouth 2 (two) times daily. 07/25/15   Janora Norlander, DO   Lurasidone HCl 60 MG TABS Take 60 mg by mouth.    [provider]  pantoprazole (PROTONIX) 40 MG tablet TAKE 1 TABLET DAILY BY MOUTH 30 TO 60 MINUTES BEFORE FIRST MEAL OF THE DAY 03/27/17   Tanda Rockers, MD  polyethylene glycol [facility]) packet Take 17 g by mouth daily. 12/03/16   McKeag, Marylynn Pearson, MD  prazosin (MINIPRESS) 2 MG capsule Take 2 mg by mouth at bedtime.  07/01/14   [provider]  QUEtiapine (SEROQUEL) 100 MG tablet Take 100 mg by mouth at bedtime.    [provider]  risperiDONE (RISPERDAL) 3 MG tablet Take 1 tablet (3 mg total) by mouth at bedtime. 11/16/16   [name], DO  traMADol (ULTRAM) 50 MG tablet Take 1 tablet (50 mg total) by mouth every 6 (six) hours as needed for up to 5 days. 03/06/19 03/11/19  Sherene Sires, DO    Family History Family History  Problem Relation Age of Onset  . Heart disease Father   . Cancer Father        Prostate    Social History Social History   Tobacco Use  . Smoking status: Former Smoker    Packs/day: 1.00    Years: 32.00    Pack years: 32.00    Types: Cigarettes    Last attempt to quit: 01/08/2019    Years since quitting: 0.1  . Smokeless tobacco: Never Used  Substance Use Topics  . Alcohol use: No  . Drug use: No     Allergies   Patient has no known allergies.   Review of Systems Review of Systems  Constitutional: Negative for chills and fever.  HENT: Negative for ear pain and sore throat.   Eyes: Negative for pain and visual disturbance.  Respiratory: Negative for cough and shortness of breath.   Cardiovascular: Negative for chest pain and palpitations.  Gastrointestinal: Negative for abdominal pain and vomiting.  Genitourinary: Negative for dysuria and hematuria.  Musculoskeletal: Positive for arthralgias. Negative for back pain.  Skin: Positive for color change. Negative for rash.  Neurological: Negative for seizures and syncope.  All other systems reviewed and are negative.     Physical Exam Triage Vital Signs ED Triage Vitals  Enc Vitals Group     BP 03/06/19 1712 (!) 148/109     Pulse Rate 03/06/19 1712 72     Resp 03/06/19 1712 20     Temp 03/06/19 1712 98.6 F (37 C)     Temp Source 03/06/19 1712 Oral     SpO2 03/06/19 1712 98 %     Weight --      Height --      Head Circumference --      Peak Flow --      Pain Score 03/06/19 1713  10     Pain Loc --      Pain Edu? --      Excl. in [place]? --    No data found.  Updated Vital Signs BP (!) 148/109 (BP Location: Right Arm)   Pulse 72   Temp 98.6 F (37 C) (Oral)   Resp 20   LMP 03/06/2019   SpO2 98%       Physical Exam Constitutional:      General: She is not in acute distress.    Appearance: She is well-developed.  HENT:     Head: Normocephalic and atraumatic.  Eyes:     Conjunctiva/sclera: Conjunctivae normal.     Pupils: Pupils are equal, round, and reactive to light.  Neck:     Musculoskeletal: Normal range of motion.  Cardiovascular:     Rate and Rhythm: Normal rate.  Pulmonary:     Effort: Pulmonary effort is normal. No respiratory distress.  Abdominal:     General: There is no distension.     Palpations: Abdomen is soft.  Musculoskeletal: Normal range of motion.     Comments: Dorsum of hand is swollen.  There is tenderness over third fourth and fifth metacarpal heads.  No tenderness over the distal radius or ulna.  Tenderness of the digits.  Extensive bruising.  Skin:    General: Skin is warm and dry.     Comments: Bruising and swelling of skin on right hand  Neurological:     General: No focal deficit present.     Mental Status: She is alert.     Sensory: No sensory deficit.     Motor: No weakness.  Psychiatric:        Mood and Affect: Mood normal.        Behavior: Behavior normal.      UC Treatments / Results  Labs (all labs ordered are listed, but only abnormal results are displayed) Labs Reviewed - No data to display  EKG None  Radiology Dg Hand Complete  Right  Result Date: 03/06/2019 CLINICAL DATA:  Pain EXAM: RIGHT HAND - COMPLETE 3+ VIEW COMPARISON:  None. FINDINGS: There is an impacted and slightly angulated fracture of the distal right fifth metacarpal; although difficult to discern there appears to be approximately 30 degrees palmar angulation. Soft tissue edema about the hypothenar aspect of the right hand. Joint spaces are generally well preserved. IMPRESSION: There is an impacted and slightly angulated fracture of the distal right fifth metacarpal; although difficult to discern there appears to be approximately 30 degrees palmar angulation. Soft tissue edema about the hypothenar aspect of the right hand. Joint spaces are generally well preserved. Electronically Signed   By: Eddie Candle M.D.   On: 03/06/2019 19:04    Procedures Procedures (including critical care time)  Medications Ordered in UC Medications - No data to display  Initial Impression / Assessment and Plan / UC Course  I have reviewed the triage vital signs and the nursing notes.  Pertinent labs & imaging results that were available during my care of the patient were reviewed by me and considered in my medical decision making (see chart for details).      Final Clinical Impressions(s) / UC Diagnoses   Final diagnoses:  Closed nondisplaced fracture of neck of fifth metacarpal bone of right hand, initial encounter     Discharge Instructions     [facility] Call orthopedic office on Monday to be seen next week Your doctor sent tramadol for you to  take for pain Leave splint in place, keep clean and dry    ED Prescriptions    None     Controlled Substance Prescriptions [place] Controlled Substance Registry consulted? Not Applicable   Raylene Everts, MD 03/06/19 2026

## 2019-03-06 NOTE — Discharge Instructions (Addendum)
[facility] Call orthopedic office on Monday to be seen next week Your doctor sent tramadol for you to take for pain Leave splint in place, keep clean and dry

## 2019-03-06 NOTE — ED Notes (Signed)
Ortho tech aware of need for arm splint

## 2019-03-06 NOTE — ED Triage Notes (Signed)
Pt states working in the garden yesterday and a large brick fell on her rt hand. Swelling and bruising noted. States sent here from her PCP for x-ray and pain meds

## 2019-03-06 NOTE — Progress Notes (Signed)
Orthopedic Tech Progress Note Patient Details:  Anoushka Divito December 21, 1970 [id number]  Ortho Devices Type of Ortho Device: Ulna gutter splint, Arm sling Ortho Device/Splint Location: URE Ortho Device/Splint Interventions: Adjustment, Application, Ordered   Post Interventions Patient Tolerated: Well Instructions Provided: Care of device, Adjustment of device   Janit Pagan 03/06/2019, 7:11 PM

## 2019-03-10 ENCOUNTER — Telehealth: Payer: Self-pay

## 2019-03-10 NOTE — Telephone Encounter (Signed)
Covid-19 travel screening questions  Have you traveled in the last 14 days? If yes where?  Do you now or have you had a fever in the last 14 days?  Do you have any respiratory symptoms of shortness of breath or cough now or in the last 14 days?  Do you have a medical history of Congestive Heart Failure?  Do you have a medical history of lung disease?  Do you have any family members or close contacts with diagnosed or suspected Covid-19?       L/M to call.

## 2019-03-11 ENCOUNTER — Other Ambulatory Visit: Payer: Self-pay

## 2019-03-11 ENCOUNTER — Ambulatory Visit (INDEPENDENT_AMBULATORY_CARE_PROVIDER_SITE_OTHER): Payer: Medicare PPO | Admitting: Gastroenterology

## 2019-03-11 DIAGNOSIS — K5909 Other constipation: Secondary | ICD-10-CM | POA: Diagnosis not present

## 2019-03-11 DIAGNOSIS — K625 Hemorrhage of anus and rectum: Secondary | ICD-10-CM

## 2019-03-11 MED ORDER — POLYETHYLENE GLYCOL 3350 17 G PO PACK: 17.0000 g | PACK | Freq: Every day | ORAL | 3 refills | Status: DC

## 2019-03-11 NOTE — Telephone Encounter (Signed)
Patient is a Transport planner visit. COVID19 screen not needed.
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
## 2019-03-19 ENCOUNTER — Encounter: Payer: Self-pay | Admitting: Gastroenterology

## 2019-03-19 DIAGNOSIS — S6991XA Unspecified injury of right wrist, hand and finger(s), initial encounter: Secondary | ICD-10-CM

## 2019-03-19 HISTORY — DX: Unspecified injury of right wrist, hand and finger(s), initial encounter: S69.91XA

## 2019-03-19 NOTE — Assessment & Plan Note (Signed)
Concern for break in right hand, short course tramadol given and patient sent to urgent care for XR as walkin XR at [facility] had already closed for the day (Friday late afternoon)  We discussed return precautions and likelihood of fracture, patient declined sling.

## 2019-03-25 DIAGNOSIS — S62316A Displaced fracture of base of fifth metacarpal bone, right hand, initial encounter for closed fracture: Secondary | ICD-10-CM | POA: Diagnosis not present

## 2019-03-25 DIAGNOSIS — M79641 Pain in right hand: Secondary | ICD-10-CM | POA: Diagnosis not present

## 2019-04-01 ENCOUNTER — Telehealth: Payer: Self-pay | Admitting: *Deleted

## 2019-04-01 NOTE — Telephone Encounter (Signed)
Pt is here.  Sample of Plenvu given to pt and instructions giver per Saul Fordyce RN

## 2019-04-01 NOTE — Telephone Encounter (Signed)
Covid-19 screening questions  Have you traveled in the last 14 days? no If yes where?  Do you now or have you had a fever in the last 14 days? no  Do you have any respiratory symptoms of shortness of breath or cough now or in the last 14 days? no  Do you have any family members or close contacts with diagnosed or suspected Covid-19 in the past 14 days? no  Have you been tested for Covid-19 and found to be positive? No  Pt is aware that care partner will wait in the car during parking lot; if they feel like they will be too hot to wait in the car; they may wait in the lobby.  We want them to wear a mask (we do not have any that we can provide them), practice social distancing, and we will check their temperatures when they get here.  I did remind patient that their care partner needs to stay in the parking lot the entire time. Pt will wear mask into building  Pt doesn't have the prep or instructions.  She has been drinking clear liquids only today.  She will be in to pick up her prep and instructions in an hours

## 2019-04-02 ENCOUNTER — Ambulatory Visit (AMBULATORY_SURGERY_CENTER): Payer: Medicare PPO | Admitting: Gastroenterology

## 2019-04-02 ENCOUNTER — Other Ambulatory Visit: Payer: Self-pay

## 2019-04-02 ENCOUNTER — Encounter: Payer: Self-pay | Admitting: Gastroenterology

## 2019-04-02 VITALS — BP 128/84 | HR 84 | Temp 98.6°F | Resp 16 | Ht 69.0 in | Wt 254.0 lb

## 2019-04-02 DIAGNOSIS — K648 Other hemorrhoids: Secondary | ICD-10-CM | POA: Diagnosis not present

## 2019-04-02 DIAGNOSIS — D122 Benign neoplasm of ascending colon: Secondary | ICD-10-CM

## 2019-04-02 DIAGNOSIS — K625 Hemorrhage of anus and rectum: Secondary | ICD-10-CM

## 2019-04-02 DIAGNOSIS — Z1211 Encounter for screening for malignant neoplasm of colon: Secondary | ICD-10-CM | POA: Diagnosis not present

## 2019-04-02 MED ORDER — SODIUM CHLORIDE 0.9 % IV SOLN: 500.0000 mL | Freq: Once | INTRAVENOUS | Status: DC

## 2019-04-02 NOTE — Op Note (Signed)
[place] Patient Name: Destry Bezdek Procedure Date: 04/02/2019 8:30 AM MRN: [id number] Endoscopist: Mallie Mussel L. Loletha Carrow , MD Age: 48 Referring MD:  Date of Birth: 06/02/1971 Gender: Female Account #: [id number] Procedure:                Colonoscopy Indications:              Rectal bleeding (intermittent x years) Medicines:                Monitored Anesthesia Care Procedure:                Pre-Anesthesia Assessment:                           - Prior to the procedure, a History and Physical                            was performed, and patient medications and                            allergies were reviewed. The patient's tolerance of                            previous anesthesia was also reviewed. The risks                            and benefits of the procedure and the sedation                            options and risks were discussed with the patient.                            All questions were answered, and informed consent                            was obtained. Prior Anticoagulants: The patient has                            taken no previous anticoagulant or antiplatelet                            agents. ASA Grade Assessment: III - A patient with                            severe systemic disease. After reviewing the risks                            and benefits, the patient was deemed in                            satisfactory condition to undergo the procedure.                           After obtaining informed consent, the colonoscope  was passed under direct vision. Throughout the                            procedure, the patient's blood pressure, pulse, and                            oxygen saturations were monitored continuously. The                            Colonoscope was introduced through the anus and                            advanced to the the cecum, identified by                            appendiceal orifice and  ileocecal valve. The                            colonoscopy was somewhat difficult due to poor                            bowel prep and a redundant colon. Successful                            completion of the procedure was aided by applying                            abdominal pressure and lavage. The patient                            tolerated the procedure well. The quality of the                            bowel preparation was poor (patient vomited AM prep                            dose). The ileocecal valve, appendiceal orifice,                            and rectum were photographed. The bowel preparation                            used was Plenvu via split dose instruction. Scope In: 8:45:05 AM Scope Out: 9:01:36 AM Scope Withdrawal Time: 0 hours 10 minutes 7 seconds  Total Procedure Duration: 0 hours 16 minutes 31 seconds  Findings:                 The perianal and digital rectal examinations were                            normal.                           A 4 mm polyp was found in the ascending colon. The  polyp was sessile. The polyp was removed with a                            cold snare. Resection and retrieval were complete.                           Internal hemorrhoids were found (RA column).                           The exam was otherwise without abnormality on                            direct and retroflexion views (given limitations of                            bowel preparation). Complications:            No immediate complications. Estimated Blood Loss:     Estimated blood loss was minimal. Impression:               - Preparation of the colon was poor.                           - One 4 mm polyp in the ascending colon, removed                            with a cold snare. Resected and retrieved.                           - Internal hemorrhoids.                           - The examination was otherwise normal on direct                             and retroflexion views. Recommendation:           - Patient has a contact number available for                            emergencies. The signs and symptoms of potential                            delayed complications were discussed with the                            patient. Return to normal activities tomorrow.                            Written discharge instructions were provided to the                            patient.                           - Resume previous diet.                           -  Continue present medications.                           - Await pathology results.                           - Repeat colonoscopy in 3 years for surveillance. Henry L. Loletha Carrow, MD 04/02/2019 9:09:25 AM This report has been signed electronically.

## 2019-04-02 NOTE — Progress Notes (Signed)
To PACU, VSS. Report to [email or web address] 

## 2019-04-02 NOTE — Progress Notes (Signed)
Called to room to assist during endoscopic procedure.  Patient ID and intended procedure confirmed with present staff. Received instructions for my participation in the procedure from the performing physician.  

## 2019-04-02 NOTE — Patient Instructions (Signed)
Thank you for allowing [name] to care for you today!  Await pathology result of polyp that was removed.  Approximately 2 weeks by mail.  Recommend next screening colonoscopy in 3 years.  Resume your normal diet and medications today.  Return to your normal activities tomorrow.      YOU HAD AN ENDOSCOPIC PROCEDURE TODAY AT [facility]:   Refer to the procedure report that was given to you for any specific questions about what was found during the examination.  If the procedure report does not answer your questions, please call your gastroenterologist to clarify.  If you requested that your care partner not be given the details of your procedure findings, then the procedure report has been included in a sealed envelope for you to review at your convenience later.  YOU SHOULD EXPECT: Some feelings of bloating in the abdomen. Passage of more gas than usual.  Walking can help get rid of the air that was put into your GI tract during the procedure and reduce the bloating. If you had a lower endoscopy (such as a colonoscopy or flexible sigmoidoscopy) you may notice spotting of blood in your stool or on the toilet paper. If you underwent a bowel prep for your procedure, you may not have a normal bowel movement for a few days.  Please Note:  You might notice some irritation and congestion in your nose or some drainage.  This is from the oxygen used during your procedure.  There is no need for concern and it should clear up in a day or so.  SYMPTOMS TO REPORT IMMEDIATELY:   Following lower endoscopy (colonoscopy or flexible sigmoidoscopy):  Excessive amounts of blood in the stool  Significant tenderness or worsening of abdominal pains  Swelling of the abdomen that is new, acute  Fever of 100F or higher   For urgent or emergent issues, a gastroenterologist can be reached at any hour by calling [phone].   DIET:  We do recommend a small meal at first, but then you may proceed to  your regular diet.  Drink plenty of fluids but you should avoid alcoholic beverages for 24 hours.  ACTIVITY:  You should plan to take it easy for the rest of today and you should NOT DRIVE or use heavy machinery until tomorrow (because of the sedation medicines used during the test).    FOLLOW UP: Our staff will call the number listed on your records 48-72 hours following your procedure to check on you and address any questions or concerns that you may have regarding the information given to you following your procedure. If we do not reach you, we will leave a message.  We will attempt to reach you two times.  During this call, we will ask if you have developed any symptoms of COVID 19. If you develop any symptoms (ie: fever, flu-like symptoms, shortness of breath, cough etc.) before then, please call [phone].  If you test positive for Covid 19 in the 2 weeks post procedure, please call and report this information to [name].    If any biopsies were taken you will be contacted by phone or by letter within the next 1-3 weeks.  Please call us at [phone] if you have not heard about the biopsies in 3 weeks.    SIGNATURES/CONFIDENTIALITY: You and/or your care partner have signed paperwork which will be entered into your electronic medical record.  These signatures attest to the fact that that the information above on  your After Visit Summary has been reviewed and is understood.  Full responsibility of the confidentiality of this discharge information lies with you and/or your care-partner. 

## 2019-04-06 ENCOUNTER — Telehealth: Payer: Self-pay | Admitting: *Deleted

## 2019-04-06 NOTE — Telephone Encounter (Signed)
  Follow up Call-  Call back number 04/02/2019  Post procedure Call Back phone  # [phone]  Permission to leave phone message Yes  Some recent data might be hidden     Patient questions:  Do you have a fever, pain , or abdominal swelling? No. Pain Score  0 *  Have you tolerated food without any problems? Yes.    Have you been able to return to your normal activities? Yes.    Do you have any questions about your discharge instructions: Diet   No. Medications  No. Follow up visit  No.  Do you have questions or concerns about your Care? No.  Actions: * If pain score is 4 or above: No action needed, pain <4.  1. Have you developed a fever since your procedure? NO  2.   Have you had an respiratory symptoms (SOB or cough) since your procedure? NO  3.   Have you tested positive for COVID 19 since your procedure NO  4.   Have you had any family members/close contacts diagnosed with the COVID 19 since your procedure?  NO   If yes to any of these questions please route to Joylene John, RN and Alphonsa Gin, RN.

## 2019-04-07 ENCOUNTER — Encounter: Payer: Self-pay | Admitting: Gastroenterology

## 2019-04-14 DIAGNOSIS — F250 Schizoaffective disorder, bipolar type: Secondary | ICD-10-CM | POA: Diagnosis not present

## 2019-04-17 ENCOUNTER — Emergency Department (HOSPITAL_COMMUNITY): Payer: Medicare PPO

## 2019-04-17 ENCOUNTER — Other Ambulatory Visit: Payer: Self-pay

## 2019-04-17 ENCOUNTER — Observation Stay (HOSPITAL_COMMUNITY)
Admission: EM | Admit: 2019-04-17 | Discharge: 2019-04-18 | Discharge status: Against Medical Advice | Payer: Medicare PPO | Attending: General Surgery | Admitting: General Surgery

## 2019-04-17 ENCOUNTER — Ambulatory Visit: Payer: Medicare PPO | Admitting: Family Medicine

## 2019-04-17 DIAGNOSIS — Z79899 Other long term (current) drug therapy: Secondary | ICD-10-CM | POA: Insufficient documentation

## 2019-04-17 DIAGNOSIS — R109 Unspecified abdominal pain: Secondary | ICD-10-CM | POA: Diagnosis not present

## 2019-04-17 DIAGNOSIS — K219 Gastro-esophageal reflux disease without esophagitis: Secondary | ICD-10-CM | POA: Diagnosis not present

## 2019-04-17 DIAGNOSIS — R0602 Shortness of breath: Secondary | ICD-10-CM | POA: Diagnosis not present

## 2019-04-17 DIAGNOSIS — Z87891 Personal history of nicotine dependence: Secondary | ICD-10-CM | POA: Diagnosis not present

## 2019-04-17 DIAGNOSIS — E119 Type 2 diabetes mellitus without complications: Secondary | ICD-10-CM | POA: Diagnosis not present

## 2019-04-17 DIAGNOSIS — Z1159 Encounter for screening for other viral diseases: Secondary | ICD-10-CM | POA: Diagnosis not present

## 2019-04-17 DIAGNOSIS — M1990 Unspecified osteoarthritis, unspecified site: Secondary | ICD-10-CM | POA: Diagnosis not present

## 2019-04-17 DIAGNOSIS — Z853 Personal history of malignant neoplasm of breast: Secondary | ICD-10-CM | POA: Diagnosis not present

## 2019-04-17 DIAGNOSIS — R52 Pain, unspecified: Secondary | ICD-10-CM | POA: Diagnosis not present

## 2019-04-17 DIAGNOSIS — F4310 Post-traumatic stress disorder, unspecified: Secondary | ICD-10-CM | POA: Insufficient documentation

## 2019-04-17 DIAGNOSIS — K8020 Calculus of gallbladder without cholecystitis without obstruction: Secondary | ICD-10-CM | POA: Diagnosis not present

## 2019-04-17 DIAGNOSIS — J449 Chronic obstructive pulmonary disease, unspecified: Secondary | ICD-10-CM | POA: Insufficient documentation

## 2019-04-17 DIAGNOSIS — R112 Nausea with vomiting, unspecified: Secondary | ICD-10-CM | POA: Diagnosis not present

## 2019-04-17 DIAGNOSIS — K8012 Calculus of gallbladder with acute and chronic cholecystitis without obstruction: Principal | ICD-10-CM | POA: Insufficient documentation

## 2019-04-17 DIAGNOSIS — K810 Acute cholecystitis: Secondary | ICD-10-CM

## 2019-04-17 DIAGNOSIS — R0902 Hypoxemia: Secondary | ICD-10-CM | POA: Diagnosis not present

## 2019-04-17 DIAGNOSIS — Z20828 Contact with and (suspected) exposure to other viral communicable diseases: Secondary | ICD-10-CM | POA: Diagnosis not present

## 2019-04-17 DIAGNOSIS — Z6837 Body mass index (BMI) 37.0-37.9, adult: Secondary | ICD-10-CM | POA: Diagnosis not present

## 2019-04-17 DIAGNOSIS — R1011 Right upper quadrant pain: Secondary | ICD-10-CM | POA: Diagnosis not present

## 2019-04-17 DIAGNOSIS — R1084 Generalized abdominal pain: Secondary | ICD-10-CM | POA: Diagnosis not present

## 2019-04-17 DIAGNOSIS — F319 Bipolar disorder, unspecified: Secondary | ICD-10-CM | POA: Insufficient documentation

## 2019-04-17 DIAGNOSIS — K819 Cholecystitis, unspecified: Secondary | ICD-10-CM

## 2019-04-17 DIAGNOSIS — R42 Dizziness and giddiness: Secondary | ICD-10-CM | POA: Diagnosis not present

## 2019-04-17 DIAGNOSIS — R110 Nausea: Secondary | ICD-10-CM | POA: Diagnosis not present

## 2019-04-17 DIAGNOSIS — K8010 Calculus of gallbladder with chronic cholecystitis without obstruction: Secondary | ICD-10-CM | POA: Diagnosis not present

## 2019-04-17 HISTORY — DX: Acute cholecystitis: K81.0

## 2019-04-17 LAB — SURGICAL PCR SCREEN
MRSA, PCR: NEGATIVE
Staphylococcus aureus: POSITIVE — AB

## 2019-04-17 LAB — CBC WITH DIFFERENTIAL/PLATELET
Abs Immature Granulocytes: 0.03 10*3/uL (ref 0.00–0.07)
Basophils Absolute: 0.1 10*3/uL (ref 0.0–0.1)
Basophils Relative: 1 %
Eosinophils Absolute: 0.1 10*3/uL (ref 0.0–0.5)
Eosinophils Relative: 1 %
HCT: 43.9 % (ref 36.0–46.0)
Hemoglobin: 13.5 g/dL (ref 12.0–15.0)
Immature Granulocytes: 0 %
Lymphocytes Relative: 17 %
Lymphs Abs: 1.6 10*3/uL (ref 0.7–4.0)
MCH: 26.5 pg (ref 26.0–34.0)
MCHC: 30.8 g/dL (ref 30.0–36.0)
MCV: 86.2 fL (ref 80.0–100.0)
Monocytes Absolute: 0.6 10*3/uL (ref 0.1–1.0)
Monocytes Relative: 7 %
Neutro Abs: 6.8 10*3/uL (ref 1.7–7.7)
Neutrophils Relative %: 74 %
Platelets: 243 10*3/uL (ref 150–400)
RBC: 5.09 MIL/uL (ref 3.87–5.11)
RDW: 15.8 % — ABNORMAL HIGH (ref 11.5–15.5)
WBC: 9.1 10*3/uL (ref 4.0–10.5)
nRBC: 0 % (ref 0.0–0.2)

## 2019-04-17 LAB — COMPREHENSIVE METABOLIC PANEL
ALT: 149 U/L — ABNORMAL HIGH (ref 0–44)
AST: 266 U/L — ABNORMAL HIGH (ref 15–41)
Albumin: 4 g/dL (ref 3.5–5.0)
Alkaline Phosphatase: 75 U/L (ref 38–126)
Anion gap: 9 (ref 5–15)
BUN: 9 mg/dL (ref 6–20)
CO2: 20 mmol/L — ABNORMAL LOW (ref 22–32)
Calcium: 8.9 mg/dL (ref 8.9–10.3)
Chloride: 107 mmol/L (ref 98–111)
Creatinine, Ser: 0.83 mg/dL (ref 0.44–1.00)
GFR calc Af Amer: >60 mL/min (ref >60)
GFR calc non Af Amer: >60 mL/min (ref >60)
Glucose, Bld: 97 mg/dL (ref 70–99)
Potassium: 3.9 mmol/L (ref 3.5–5.1)
Sodium: 136 mmol/L (ref 135–145)
Total Bilirubin: 0.8 mg/dL (ref 0.3–1.2)
Total Protein: 7.6 g/dL (ref 6.5–8.1)

## 2019-04-17 LAB — LIPASE, BLOOD: Lipase: 24 U/L (ref 11–51)

## 2019-04-17 LAB — I-STAT BETA HCG BLOOD, ED (MC, WL, AP ONLY): I-stat hCG, quantitative: <5 m[IU]/mL (ref <5)

## 2019-04-17 LAB — SARS CORONAVIRUS 2 BY RT PCR (HOSPITAL ORDER, PERFORMED IN ~~LOC~~ HOSPITAL LAB): SARS Coronavirus 2: NEGATIVE

## 2019-04-17 MED ORDER — LAMOTRIGINE 100 MG PO TABS
200.0000 mg | ORAL_TABLET | Freq: Two times a day (BID) | ORAL | Status: DC
  Administered 2019-04-17 – 2019-04-18 (×2): 200 mg via ORAL
  Filled 2019-04-17 (×2): qty 2

## 2019-04-17 MED ORDER — SIMETHICONE 80 MG PO CHEW
40.0000 mg | CHEWABLE_TABLET | Freq: Four times a day (QID) | ORAL | Status: DC | PRN
  Filled 2019-04-17: qty 1

## 2019-04-17 MED ORDER — SODIUM CHLORIDE 0.9 % IV SOLN: INTRAVENOUS | Status: DC | PRN

## 2019-04-17 MED ORDER — DOXEPIN HCL 50 MG PO CAPS
50.0000 mg | ORAL_CAPSULE | Freq: Every day | ORAL | Status: DC
  Administered 2019-04-17 – 2019-04-18 (×2): 50 mg via ORAL
  Filled 2019-04-17 (×2): qty 1

## 2019-04-17 MED ORDER — FAMOTIDINE IN NACL 20-0.9 MG/50ML-% IV SOLN
20.0000 mg | Freq: Once | INTRAVENOUS | Status: AC
  Administered 2019-04-17: 20 mg via INTRAVENOUS
  Filled 2019-04-17: qty 50

## 2019-04-17 MED ORDER — HYDRALAZINE HCL 20 MG/ML IJ SOLN: 10.0000 mg | INTRAMUSCULAR | Status: DC | PRN

## 2019-04-17 MED ORDER — DIPHENHYDRAMINE HCL 25 MG PO CAPS
25.0000 mg | ORAL_CAPSULE | Freq: Four times a day (QID) | ORAL | Status: DC | PRN
  Administered 2019-04-17 – 2019-04-18 (×2): 25 mg via ORAL
  Filled 2019-04-17 (×2): qty 1

## 2019-04-17 MED ORDER — SODIUM CHLORIDE 0.9 % IV SOLN
2.0000 g | INTRAVENOUS | Status: DC
  Administered 2019-04-17 – 2019-04-18 (×2): 2 g via INTRAVENOUS
  Filled 2019-04-17 (×2): qty 2

## 2019-04-17 MED ORDER — MORPHINE SULFATE (PF) 4 MG/ML IV SOLN
4.0000 mg | Freq: Once | INTRAVENOUS | Status: AC
  Administered 2019-04-17: 4 mg via INTRAVENOUS
  Filled 2019-04-17: qty 1

## 2019-04-17 MED ORDER — OXYCODONE HCL 5 MG PO TABS
5.0000 mg | ORAL_TABLET | ORAL | Status: DC | PRN
  Administered 2019-04-17: 5 mg via ORAL
  Administered 2019-04-17 – 2019-04-18 (×3): 10 mg via ORAL
  Filled 2019-04-17 (×3): qty 2
  Filled 2019-04-17: qty 1

## 2019-04-17 MED ORDER — ACETAMINOPHEN 325 MG PO TABS: 650.0000 mg | ORAL_TABLET | Freq: Four times a day (QID) | ORAL | Status: DC | PRN

## 2019-04-17 MED ORDER — ENOXAPARIN SODIUM 40 MG/0.4ML ~~LOC~~ SOLN
40.0000 mg | SUBCUTANEOUS | Status: DC
  Administered 2019-04-17 – 2019-04-18 (×2): 40 mg via SUBCUTANEOUS
  Filled 2019-04-17 (×2): qty 0.4

## 2019-04-17 MED ORDER — ONDANSETRON 4 MG PO TBDP: 4.0000 mg | ORAL_TABLET | Freq: Four times a day (QID) | ORAL | Status: DC | PRN

## 2019-04-17 MED ORDER — QUETIAPINE FUMARATE 100 MG PO TABS
100.0000 mg | ORAL_TABLET | Freq: Every day | ORAL | Status: DC
  Administered 2019-04-17 – 2019-04-18 (×2): 100 mg via ORAL
  Filled 2019-04-17 (×2): qty 1

## 2019-04-17 MED ORDER — ONDANSETRON HCL 4 MG/2ML IJ SOLN: 4.0000 mg | Freq: Four times a day (QID) | INTRAMUSCULAR | Status: DC | PRN

## 2019-04-17 MED ORDER — FLUOXETINE HCL 40 MG PO CAPS: 80.0000 mg | ORAL_CAPSULE | Freq: Every day | ORAL | Status: DC

## 2019-04-17 MED ORDER — CLONAZEPAM 1 MG PO TABS
1.0000 mg | ORAL_TABLET | Freq: Two times a day (BID) | ORAL | Status: DC | PRN
  Administered 2019-04-17 – 2019-04-18 (×2): 1 mg via ORAL
  Filled 2019-04-17: qty 2
  Filled 2019-04-17: qty 1

## 2019-04-17 MED ORDER — ONDANSETRON HCL 4 MG/2ML IJ SOLN
4.0000 mg | Freq: Once | INTRAMUSCULAR | Status: AC
  Administered 2019-04-17: 4 mg via INTRAVENOUS
  Filled 2019-04-17: qty 2

## 2019-04-17 MED ORDER — IPRATROPIUM-ALBUTEROL 0.5-2.5 (3) MG/3ML IN SOLN: 3.0000 mL | RESPIRATORY_TRACT | Status: DC | PRN

## 2019-04-17 MED ORDER — ACETAMINOPHEN 650 MG RE SUPP: 650.0000 mg | Freq: Four times a day (QID) | RECTAL | Status: DC | PRN

## 2019-04-17 MED ORDER — SODIUM CHLORIDE 0.9 % IV BOLUS
1000.0000 mL | Freq: Once | INTRAVENOUS | Status: AC
  Administered 2019-04-17: 1000 mL via INTRAVENOUS

## 2019-04-17 MED ORDER — METHOCARBAMOL 500 MG PO TABS: 500.0000 mg | ORAL_TABLET | Freq: Four times a day (QID) | ORAL | Status: DC | PRN

## 2019-04-17 MED ORDER — ALPRAZOLAM ER 1 MG PO TB24: 2.0000 mg | ORAL_TABLET | Freq: Every day | ORAL | Status: DC

## 2019-04-17 MED ORDER — PROMETHAZINE HCL 25 MG/ML IJ SOLN
25.0000 mg | Freq: Once | INTRAMUSCULAR | Status: AC
  Administered 2019-04-17: 25 mg via INTRAVENOUS
  Filled 2019-04-17: qty 1

## 2019-04-17 MED ORDER — MUPIROCIN 2 % EX OINT: 1.0000 "application " | TOPICAL_OINTMENT | Freq: Two times a day (BID) | CUTANEOUS | Status: DC

## 2019-04-17 MED ORDER — PANTOPRAZOLE SODIUM 40 MG IV SOLR
40.0000 mg | Freq: Every day | INTRAVENOUS | Status: DC
  Administered 2019-04-17: 40 mg via INTRAVENOUS
  Filled 2019-04-17: qty 40

## 2019-04-17 MED ORDER — FLUOXETINE HCL 20 MG PO CAPS
80.0000 mg | ORAL_CAPSULE | Freq: Every day | ORAL | Status: DC
  Administered 2019-04-17: 80 mg via ORAL
  Filled 2019-04-17: qty 4

## 2019-04-17 MED ORDER — DIPHENHYDRAMINE HCL 50 MG/ML IJ SOLN: 25.0000 mg | Freq: Four times a day (QID) | INTRAMUSCULAR | Status: DC | PRN

## 2019-04-17 MED ORDER — BUSPIRONE HCL 5 MG PO TABS
15.0000 mg | ORAL_TABLET | Freq: Three times a day (TID) | ORAL | Status: DC
  Administered 2019-04-17 – 2019-04-18 (×3): 15 mg via ORAL
  Filled 2019-04-17: qty 2
  Filled 2019-04-17 (×2): qty 1

## 2019-04-17 MED ORDER — POLYETHYLENE GLYCOL 3350 17 G PO PACK: 17.0000 g | PACK | Freq: Every day | ORAL | Status: DC | PRN

## 2019-04-17 MED ORDER — PRAZOSIN HCL 1 MG PO CAPS
2.0000 mg | ORAL_CAPSULE | Freq: Every day | ORAL | Status: DC
  Administered 2019-04-17 – 2019-04-18 (×2): 2 mg via ORAL
  Filled 2019-04-17 (×2): qty 2
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
## 2019-04-17 NOTE — H&P (Signed)
[facility] Admission Note  Kimberly [name] 11-Mar-1971  [id number].    Requesting MD: Coral Ceo, [name] Chief Complaint/Reason for Consult: Gallstones  HPI: Kimberly [name] is a 48 y.o. female with a history is a history of breast cancer status post radiation and bilateral lumpectomy who presented to the emergency department with abdominal pain, nausea as well as vomiting.  Patient reports she awoke this morning with epigastric abdominal pain that radiated to her right upper quadrant and bilateral flanks.  She reports that pain was mild at onset and has gradually worsened. Her pain is constant. She reports that she has associated chills, nausea and has had approximately 4 episodes of emesis.  She denies any aggravating or relieving factors.  She denies history of similar symptoms in the past.  She reports she was asymptomatic yesterday.  The patient denies any fever, cough, urinary symptoms, constipation or diarrhea.  No prior abdominal surgeries.  She presented to the emergency department for evaluation.  In the emergency department she was found to be afebrile, heart rate 99-102, and hypertensive.  WBC 9100, AST 276, ALT 149, alk phos 75, T bili 0.8.  Lipase 24. RUQ ultrasound showed a 1.9 cm gallstone in the gallbladder neck.  There is borderline gallbladder wall thickening at 3.3 mm.  No pericholecystic fluid.  Common bile duct was 9 mm. General surgery was asked to see.   ROS: Review of Systems  Constitutional: Positive for chills. Negative for fever.  Respiratory: Negative for cough and shortness of breath.   Cardiovascular: Negative for leg swelling.  Gastrointestinal: Positive for abdominal pain. Negative for blood in stool, constipation, diarrhea, melena, nausea and vomiting.  All systems reviewed and otherwise negative except for as above  Family History  Problem Relation Age of Onset  . Heart disease Father   . Cancer Father        Prostate  . Colon cancer  Maternal Grandfather   . Colon cancer Paternal Grandmother   . Stomach cancer Neg Hx   . Esophageal cancer Neg Hx     Past Medical History:  Diagnosis Date  . Anemia   . Arthritis   . Bipolar 1 disorder ([place])   . Blood transfusion without reported diagnosis 2007   post bleeding childbirth  . Cancer ([place])    eval  lt breast cancer  . Diabetes mellitus without complication ([place])    gestational  . Heart murmur    as a child-not adult-no cardiac work up  . History of radiation therapy 08/31/14-10/21/14   left breast/ left supraclavicular 50.4 Gy 28 fx, lef tposterior axillary boost 9.52 Gy 28 fx, left rbeast boost/ 10 Gy 5 fx    Past Surgical History:  Procedure Laterality Date  . BREAST BIOPSY Left 04/01/14  . BREAST BIOPSY Right 04/19/14  . BREAST LUMPECTOMY WITH AXILLARY LYMPH NODE DISSECTION Bilateral 05/03/14  . DILATION AND CURETTAGE OF UTERUS     after childbirth  . MULTIPLE TOOTH EXTRACTIONS     only 5 left  . PORTACATH PLACEMENT Right 06/18/2014   Procedure: INSERTION PORT-A-CATH;  Surgeon: [name], MD;  Location: [facility];  Service: General;  Laterality: Right;  . TONSILLECTOMY AND ADENOIDECTOMY      Social History:  reports that she quit smoking about 3 months ago. Her smoking use included cigarettes. She has a 32.00 pack-year smoking history. She has never used smokeless tobacco. She reports that she does not drink alcohol or use drugs.  Patient reports she smoked for  50 years, recently quitting 3 months ago She denies any alcohol use or illicit drug use Lives at home with her children Is currently on disability  Allergies: No Known Allergies  (Not in a hospital admission)   Prior to Admission medications   Medication Sig Start Date End Date Taking? Authorizing Provider  albuterol (PROVENTIL HFA;VENTOLIN HFA) 108 (90 Base) MCG/ACT inhaler Inhale 2 puffs into the lungs every 6 (six) hours as needed for wheezing or shortness of breath.  02/25/17   [name], DO  ALPRAZolam (XANAX XR) 2 MG 24 hr tablet TAKE 1 TABLET BY MOUTH IN THE  MORNING FOR ANXIETY. 10/31/18   [provider]  amphetamine-dextroamphetamine (ADDERALL) 15 MG tablet TK 1 T PO BID FOR ADD 09/15/18   [provider]  busPIRone (BUSPAR) 15 MG tablet TK 1 T PO TID 10/27/18   [provider]  clonazePAM (KLONOPIN) 1 MG tablet TK 1 T PO ONCE TO BID PRF ANXIETY. 10/28/18   [provider]  doxepin (SINEQUAN) 50 MG capsule TK 1 C PO Q NIGHT 12/18/18   [provider]  FLUoxetine (PROZAC) 40 MG capsule TK 2 CS PO ONCE D 10/27/18   [provider]  ipratropium-albuterol (DUONEB) 0.5-2.5 (3) MG/3ML SOLN Take 3 mLs by nebulization every 4 (four) hours as needed. 03/08/17   Mikell, Jeani Sow, MD  lamoTRIgine (LAMICTAL) 200 MG tablet Take 1 tablet (200 mg total) by mouth 2 (two) times daily. 07/25/15   Janora Norlander, DO  Lurasidone HCl 60 MG TABS Take 60 mg by mouth.    [provider]  pantoprazole (PROTONIX) 40 MG tablet TAKE 1 TABLET DAILY BY MOUTH 30 TO 60 MINUTES BEFORE FIRST MEAL OF THE DAY 03/27/17   Tanda Rockers, MD  polyethylene glycol (MIRALAX / GLYCOLAX) 17 g packet Take 17 g by mouth daily. 03/11/19   Doran Stabler, MD  prazosin (MINIPRESS) 2 MG capsule Take 2 mg by mouth at bedtime.  07/01/14   [provider]  QUEtiapine (SEROQUEL) 100 MG tablet Take 100 mg by mouth at bedtime.    [provider]  risperiDONE (RISPERDAL) 3 MG tablet Take 1 tablet (3 mg total) by mouth at bedtime. Patient not taking: Reported on 04/02/2019 11/16/16   [name], DO    Blood pressure (!) 126/95, pulse 99, temperature 97.8 F (36.6 C), temperature source Oral, resp. rate 18, SpO2 100 %. Physical Exam: General: pleasant, WD/WN female who is laying in bed in NAD HEENT: head is normocephalic, atraumatic.  Sclera are noninjected.  Pupils equal and round.  Ears and nose without any masses or  lesions.  Mouth is pink and moist. Edentulous.  Heart: regular, rate, and rhythm.  No obvious murmurs, gallops, or rubs noted.  Palpable pedal pulses bilaterally Lungs: CTAB, no wheezes, rhonchi, or rales noted.  Respiratory effort nonlabored Abd: Soft, ND, epigastric and RUQ tenderness with + Murphy's sign. +BS, no masses, hernias, or organomegaly.  MS: all 4 extremities are symmetrical with no cyanosis, clubbing, or edema. Skin: warm and dry with no masses, lesions, or rashes Psych: A&Ox3 with an appropriate affect. Neuro: cranial nerves grossly intact, extremity CSM intact bilaterally, normal speech  Results for orders placed or performed during the hospital encounter of 04/17/19 (from the past 48 hour(s))  CBC with Differential     Status: Abnormal   Collection Time: 04/17/19  1:40 PM  Result Value Ref Range   WBC 9.1 4.0 - 10.5 K/uL  RBC 5.09 3.87 - 5.11 MIL/uL   Hemoglobin 13.5 12.0 - 15.0 g/dL   HCT 43.9 36.0 - 46.0 %   MCV 86.2 80.0 - 100.0 fL   MCH 26.5 26.0 - 34.0 pg   MCHC 30.8 30.0 - 36.0 g/dL   RDW 15.8 (H) 11.5 - 15.5 %   Platelets 243 150 - 400 K/uL   nRBC 0.0 0.0 - 0.2 %   Neutrophils Relative % 74 %   Neutro Abs 6.8 1.7 - 7.7 K/uL   Lymphocytes Relative 17 %   Lymphs Abs 1.6 0.7 - 4.0 K/uL   Monocytes Relative 7 %   Monocytes Absolute 0.6 0.1 - 1.0 K/uL   Eosinophils Relative 1 %   Eosinophils Absolute 0.1 0.0 - 0.5 K/uL   Basophils Relative 1 %   Basophils Absolute 0.1 0.0 - 0.1 K/uL   Immature Granulocytes 0 %   Abs Immature Granulocytes 0.03 0.00 - 0.07 K/uL    Comment: Performed at [facility], [place] [address] [place], [place] [address]  Comprehensive metabolic panel     Status: Abnormal   Collection Time: 04/17/19  1:40 PM  Result Value Ref Range   Sodium 136 135 - 145 mmol/L   Potassium 3.9 3.5 - 5.1 mmol/L   Chloride 107 98 - 111 mmol/L   CO2 20 (L) 22 - 32 mmol/L   Glucose, Bld 97 70 - 99 mg/dL   BUN 9 6 - 20 mg/dL    Creatinine, Ser 0.83 0.44 - 1.00 mg/dL   Calcium 8.9 8.9 - 10.3 mg/dL   Total Protein 7.6 6.5 - 8.1 g/dL   Albumin 4.0 3.5 - 5.0 g/dL   AST 266 (H) 15 - 41 U/L   ALT 149 (H) 0 - 44 U/L   Alkaline Phosphatase 75 38 - 126 U/L   Total Bilirubin 0.8 0.3 - 1.2 mg/dL   GFR calc non Af Amer >60 >60 mL/min   GFR calc Af Amer >60 >60 mL/min   Anion gap 9 5 - 15    Comment: Performed at [facility], [place] [address], [place], [place] [address]  Lipase, blood     Status: None   Collection Time: 04/17/19  1:40 PM  Result Value Ref Range   Lipase 24 11 - 51 U/L    Comment: Performed at [facility], [place] [address], [place], [place] [address]  I-Stat Beta hCG blood, ED ([facility], [facility], [facility] only)     Status: None   Collection Time: 04/17/19  1:46 PM  Result Value Ref Range   I-stat hCG, quantitative <5.0 <5 mIU/mL   Comment 3            Comment:   GEST. AGE      CONC.  (mIU/mL)   <=1 WEEK        5 - 50     2 WEEKS       50 - 500     3 WEEKS       100 - 10,000     4 WEEKS     1,000 - 30,000        FEMALE AND NON-PREGNANT FEMALE:     LESS THAN 5 mIU/mL    Dg Chest Portable 1 View  Result Date: 04/17/2019 CLINICAL DATA:  Abdominal pain.  Nausea. EXAM: PORTABLE CHEST 1 VIEW COMPARISON:  CT 03/06/2017. FINDINGS: PowerPort catheter with tip over superior vena cava. Heart size normal. No focal infiltrate. No  pleural effusion or pneumothorax. Degenerative change thoracic spine. Surgical clips noted over the left chest. IMPRESSION: PowerPort catheter with tip over superior vena cava. No acute cardiopulmonary disease. Electronically Signed   By: Marcello Moores  Register   On: 04/17/2019 13:36   US Abdomen Limited Ruq  Result Date: 04/17/2019 CLINICAL DATA:  Right upper abdominal pain x2 days EXAM: ULTRASOUND ABDOMEN LIMITED RIGHT UPPER QUADRANT COMPARISON:  None. FINDINGS: Gallbladder: 1.9 cm gallstone in the gallbladder neck. Borderline gallbladder wall thickening up to 3.3 mm. No  pericholecystic fluid. Sonographer does not comment on sonographic Murphy's sign. Common bile duct: Diameter: 9 mm, dilated for age Liver: No focal lesion identified. Within normal limits in parenchymal echogenicity. Portal vein is patent on color Doppler imaging with normal direction of blood flow towards the liver. IMPRESSION: 1. Cholelithiasis 2. Dilated common bile duct. Correlate with any clinical or laboratory evidence of biliary obstruction. Electronically Signed   By: Lucrezia Europe M.D.   On: 04/17/2019 15:10   Anti-infectives (From admission, onward)   Start     Dose/Rate Route Frequency Ordered Stop   04/17/19 1700  cefTRIAXone (ROCEPHIN) 2 g in sodium chloride 0.9 % 100 mL IVPB     2 g 200 mL/hr over 30 Minutes Intravenous Every 24 hours 04/17/19 1608          Assessment/Plan Bipolar disorder - home meds   Acute Cholecystitis - RUQ ultrasound showed a 1.9 cm gallstone in the gallbladder neck. Borderline gallbladder wall thickening at 3.3 mm.  No pericholecystic fluid. - WBC 9100, AST 276, ALT 149, Alk Phos 75 - CBD 31m. T bili 0.8.  Lipase 24.  - Will plan to recheck labs in AM. If LFTs stable/downtrending will plan for lap chole for the morning. If LFTs are uptrending, will get MRCP and possible GI consult.  - IV abx, CLD today, NPO after midnight.   ID - Rocephin VTE - SCD, Lovenox FEN - CLD today, NPO after midnight  MJillyn Ledger [facility] 04/17/2019, 4:09 PM Pager: [phone]

## 2019-04-17 NOTE — ED Provider Notes (Signed)
[facility] DEPT Provider Note   CSN: [id number] Arrival date & time: 04/17/19  1231    History   Chief Complaint Chief Complaint  Patient presents with  . Abdominal Pain    HPI Zakya Halabi is a 48 y.o. female.     HPI   Pt is 48 y/o female with a h/o bipolar d/o, PTSD, DM, who presents to the ED today for eval of abd pain that began this morning. Pain located to the upper abdomen. Pain located to the epigastrium. Pain initially radiating to the back.  Reports associated nausea, vomiting. Has had a small amount of blood tinged vomit. No coffee groun emesis.  Denies diarrhea, constipation. Denies dysuria, frequency hematuria. Denies associated chest pain. Does note some sob. Denies exacerbating or alleviating factors.   Denies ETOH use or drug use  Past Medical History:  Diagnosis Date  . Anemia   . Arthritis   . Bipolar 1 disorder ([place])   . Blood transfusion without reported diagnosis 2007   post bleeding childbirth  . Cancer ([place])    eval  lt breast cancer  . Diabetes mellitus without complication ([place])    gestational  . Heart murmur    as a child-not adult-no cardiac work up  . History of radiation therapy 08/31/14-10/21/14   left breast/ left supraclavicular 50.4 Gy 28 fx, lef tposterior axillary boost 9.52 Gy 28 fx, left rbeast boost/ 10 Gy 5 fx    Patient Active Problem List   Diagnosis Date Noted  . Acute cholecystitis 04/17/2019  . Hand injury, right, initial encounter 03/19/2019  . Total body pain 01/13/2019  . Blood in stool 01/13/2019  . Postmenopausal bleeding 10/25/2017  . Abscess of axilla, right 05/29/2017  . Chronic pain of both knees 05/03/2017  . Upper airway cough syndrome 03/27/2017  . Discogenic low back pain 03/20/2017  . COPD (chronic obstructive pulmonary disease) ([place]) 03/20/2017  . Tachycardia   . Dyspnea on exertion   . Grieving 11/21/2016  . Mixed incontinence 09/29/2015  . Genetic testing  08/26/2015  . Malignant neoplasm of upper-outer quadrant of left breast in female, estrogen receptor positive ([place]) 05/07/2014  . Disorder of bladder 09/29/2008  . LOW BACK PAIN, CHRONIC 09/29/2008  . Morbid (severe) obesity due to excess calories ([place]) 11/19/2007  . ANEMIA, IRON DEFICIENCY, CHRONIC 11/14/2007  . Mixed bipolar I disorder ([place]) 02/20/2007  . Cigarette smoker 02/20/2007    Past Surgical History:  Procedure Laterality Date  . BREAST BIOPSY Left 04/01/14  . BREAST BIOPSY Right 04/19/14  . BREAST LUMPECTOMY WITH AXILLARY LYMPH NODE DISSECTION Bilateral 05/03/14  . DILATION AND CURETTAGE OF UTERUS     after childbirth  . MULTIPLE TOOTH EXTRACTIONS     only 5 left  . PORTACATH PLACEMENT Right 06/18/2014   Procedure: INSERTION PORT-A-CATH;  Surgeon: [name], MD;  Location: [facility];  Service: General;  Laterality: Right;  . TONSILLECTOMY AND ADENOIDECTOMY       OB History   No obstetric history on file.      Home Medications    Prior to Admission medications   Medication Sig Start Date End Date Taking? Authorizing Provider  busPIRone (BUSPAR) 15 MG tablet Take 15 mg by mouth 3 (three) times daily.  10/27/18  Yes [provider]  clonazePAM (KLONOPIN) 1 MG tablet Take 1 mg by mouth 2 (two) times daily as needed for anxiety.  10/28/18  Yes [provider]  doxepin (SINEQUAN) 50 MG capsule  Take 50 mg by mouth at bedtime.  12/18/18  Yes [provider]  FLUoxetine (PROZAC) 40 MG capsule Take 80 mg by mouth daily.  10/27/18  Yes [provider]  lamoTRIgine (LAMICTAL) 200 MG tablet Take 1 tablet (200 mg total) by mouth 2 (two) times daily. 07/25/15  Yes Gottschalk, Leatrice Jewels M, DO  Lurasidone HCl 60 MG TABS Take 60 mg by mouth at bedtime.    Yes [provider]  prazosin (MINIPRESS) 2 MG capsule Take 2 mg by mouth at bedtime as needed (nightmares).  07/01/14  Yes [provider]  QUEtiapine (SEROQUEL) 100 MG  tablet Take 100 mg by mouth at bedtime.   Yes [provider]  albuterol (PROVENTIL HFA;VENTOLIN HFA) 108 (90 Base) MCG/ACT inhaler Inhale 2 puffs into the lungs every 6 (six) hours as needed for wheezing or shortness of breath. Patient not taking: Reported on 04/17/2019 02/25/17   [name], DO  ipratropium-albuterol (DUONEB) 0.5-2.5 (3) MG/3ML SOLN Take 3 mLs by nebulization every 4 (four) hours as needed. Patient not taking: Reported on 04/17/2019 03/08/17   Tonette Bihari, MD  pantoprazole (PROTONIX) 40 MG tablet TAKE 1 TABLET DAILY BY MOUTH 30 TO 60 MINUTES BEFORE FIRST MEAL OF THE DAY Patient not taking: No sig reported 03/27/17   Tanda Rockers, MD  polyethylene glycol (MIRALAX / GLYCOLAX) 17 g packet Take 17 g by mouth daily. Patient not taking: Reported on 04/17/2019 03/11/19   Doran Stabler, MD  risperiDONE (RISPERDAL) 3 MG tablet Take 1 tablet (3 mg total) by mouth at bedtime. Patient not taking: Reported on 04/02/2019 11/16/16   [name], DO    Family History Family History  Problem Relation Age of Onset  . Heart disease Father   . Cancer Father        Prostate  . Colon cancer Maternal Grandfather   . Colon cancer Paternal Grandmother   . Stomach cancer Neg Hx   . Esophageal cancer Neg Hx     Social History Social History   Tobacco Use  . Smoking status: Former Smoker    Packs/day: 1.00    Years: 32.00    Pack years: 32.00    Types: Cigarettes    Quit date: 01/08/2019    Years since quitting: 0.2  . Smokeless tobacco: Never Used  Substance Use Topics  . Alcohol use: No  . Drug use: No     Allergies   Patient has no known allergies.   Review of Systems Review of Systems  Constitutional: Negative for fever.  HENT: Negative for sore throat.   Eyes: Negative for visual disturbance.  Respiratory: Positive for shortness of breath. Negative for cough.   Cardiovascular: Negative for chest pain.  Gastrointestinal: Positive for abdominal  pain, nausea and vomiting. Negative for constipation and diarrhea.  Genitourinary: Negative for dysuria, hematuria, pelvic pain and urgency.  Musculoskeletal: Positive for back pain.  Skin: Negative for rash.  Neurological: Negative for headaches.  All other systems reviewed and are negative.    Physical Exam Updated Vital Signs BP 130/77 (BP Location: Left Arm)   Pulse 93   Temp 98.6 F (37 C) (Oral)   Resp 16   SpO2 100%   Physical Exam Vitals signs and nursing note reviewed.  Constitutional:      General: She is not in acute distress.    Appearance: She is well-developed. She is ill-appearing.  HENT:     Head: Normocephalic and atraumatic.  Eyes:  Conjunctiva/sclera: Conjunctivae normal.  Neck:     Musculoskeletal: Neck supple.  Cardiovascular:     Rate and Rhythm: Normal rate and regular rhythm.     Heart sounds: No murmur.  Pulmonary:     Effort: Pulmonary effort is normal.     Comments: Mild rhonchi, rll Abdominal:     General: Bowel sounds are decreased.     Palpations: Abdomen is soft.     Tenderness: There is abdominal tenderness in the right upper quadrant and epigastric area. There is guarding. There is no right CVA tenderness, left CVA tenderness or rebound.     Comments: Actively vomiting on exam  Skin:    General: Skin is warm and dry.  Neurological:     Mental Status: She is alert.      ED Treatments / Results  Labs (all labs ordered are listed, but only abnormal results are displayed) Labs Reviewed  CBC WITH DIFFERENTIAL/PLATELET - Abnormal; Notable for the following components:      Result Value   RDW 15.8 (*)    All other components within normal limits  COMPREHENSIVE METABOLIC PANEL - Abnormal; Notable for the following components:   CO2 20 (*)    AST 266 (*)    ALT 149 (*)    All other components within normal limits  SARS CORONAVIRUS 2 (HOSPITAL ORDER, [facility])  SURGICAL PCR SCREEN  LIPASE, BLOOD   URINALYSIS, ROUTINE W REFLEX MICROSCOPIC  HIV ANTIBODY (ROUTINE TESTING W REFLEX)  COMPREHENSIVE METABOLIC PANEL  CBC  LIPASE, BLOOD  I-STAT BETA HCG BLOOD, ED ([facility], [facility], [facility] ONLY)    EKG None  Radiology Dg Chest Portable 1 View  Result Date: 04/17/2019 CLINICAL DATA:  Abdominal pain.  Nausea. EXAM: PORTABLE CHEST 1 VIEW COMPARISON:  CT 03/06/2017. FINDINGS: PowerPort catheter with tip over superior vena cava. Heart size normal. No focal infiltrate. No pleural effusion or pneumothorax. Degenerative change thoracic spine. Surgical clips noted over the left chest. IMPRESSION: PowerPort catheter with tip over superior vena cava. No acute cardiopulmonary disease. Electronically Signed   By: Marcello Moores  Register   On: 04/17/2019 13:36   US Abdomen Limited Ruq  Result Date: 04/17/2019 CLINICAL DATA:  Right upper abdominal pain x2 days EXAM: ULTRASOUND ABDOMEN LIMITED RIGHT UPPER QUADRANT COMPARISON:  None. FINDINGS: Gallbladder: 1.9 cm gallstone in the gallbladder neck. Borderline gallbladder wall thickening up to 3.3 mm. No pericholecystic fluid. Sonographer does not comment on sonographic Murphy's sign. Common bile duct: Diameter: 9 mm, dilated for age Liver: No focal lesion identified. Within normal limits in parenchymal echogenicity. Portal vein is patent on color Doppler imaging with normal direction of blood flow towards the liver. IMPRESSION: 1. Cholelithiasis 2. Dilated common bile duct. Correlate with any clinical or laboratory evidence of biliary obstruction. Electronically Signed   By: Lucrezia Europe M.D.   On: 04/17/2019 15:10    Procedures Procedures (including critical care time)  Medications Ordered in ED Medications  enoxaparin (LOVENOX) injection 40 mg (40 mg Subcutaneous Given 04/17/19 1826)  cefTRIAXone (ROCEPHIN) 2 g in sodium chloride 0.9 % 100 mL IVPB (has no administration in time range)  acetaminophen (TYLENOL) tablet 650 mg (has no administration in time range)    Or   acetaminophen (TYLENOL) suppository 650 mg (has no administration in time range)  oxyCODONE (Oxy IR/ROXICODONE) immediate release tablet 5-10 mg (has no administration in time range)  methocarbamol (ROBAXIN) tablet 500 mg (has no administration in time range)  diphenhydrAMINE (BENADRYL) capsule 25  mg (has no administration in time range)    Or  diphenhydrAMINE (BENADRYL) injection 25 mg (has no administration in time range)  polyethylene glycol (MIRALAX / GLYCOLAX) packet 17 g (has no administration in time range)  ondansetron (ZOFRAN-ODT) disintegrating tablet 4 mg (has no administration in time range)    Or  ondansetron (ZOFRAN) injection 4 mg (has no administration in time range)  simethicone (MYLICON) chewable tablet 40 mg (has no administration in time range)  pantoprazole (PROTONIX) injection 40 mg (has no administration in time range)  hydrALAZINE (APRESOLINE) injection 10 mg (has no administration in time range)  busPIRone (BUSPAR) tablet 15 mg (has no administration in time range)  clonazePAM (KLONOPIN) tablet 1 mg (has no administration in time range)  doxepin (SINEQUAN) capsule 50 mg (has no administration in time range)  lamoTRIgine (LAMICTAL) tablet 200 mg (has no administration in time range)  ipratropium-albuterol (DUONEB) 0.5-2.5 (3) MG/3ML nebulizer solution 3 mL (has no administration in time range)  QUEtiapine (SEROQUEL) tablet 100 mg (has no administration in time range)  prazosin (MINIPRESS) capsule 2 mg (has no administration in time range)  mupirocin ointment (BACTROBAN) 2 % 1 application (has no administration in time range)  FLUoxetine (PROZAC) capsule 80 mg (has no administration in time range)  ondansetron (ZOFRAN) injection 4 mg (4 mg Intravenous Given 04/17/19 1337)  sodium chloride 0.9 % bolus 1,000 mL (0 mLs Intravenous Stopped 04/17/19 1627)  morphine 4 MG/ML injection 4 mg (4 mg Intravenous Given 04/17/19 1337)  famotidine (PEPCID) IVPB 20 mg premix (0 mg  Intravenous Stopped 04/17/19 1409)  promethazine (PHENERGAN) injection 25 mg (25 mg Intravenous Given 04/17/19 1428)  morphine 4 MG/ML injection 4 mg (4 mg Intravenous Given 04/17/19 1456)     Initial Impression / Assessment and Plan / ED Course  I have reviewed the triage vital signs and the nursing notes.  Pertinent labs & imaging results that were available during my care of the patient were reviewed by me and considered in my medical decision making (see chart for details).  Final Clinical Impressions(s) / ED Diagnoses   Final diagnoses:  RUQ pain   48 y/o female presenting to the ED today c/o epigastric abd pain with NV. No diarrhea/constipation.   On exam, RUQ and epigastric TTP with guarding. No rebound TTP.   CBC without leukocytosis or anemia CMP with mildly low bicarb.  Normal BUN/creatinine.  AST/ALT elevated at 266 and 149.  T bili is normal. Lipase negative Beta hcg negative UA pending at the time of admission.  CXR negative  RUQ [name] with 1.9 cm gallstone in the gallbladder neck. Borderline gallbladder wall thickening up to 3.3 mm. No pericholecystic fluid. Sonographer does not comment on sonographic Murphy's sign. Common bile duct: Diameter: 9 mm, dilated for age  57:30 PM CONSULT with Alferd Apa with general surgery who will evaluate the pt in the ED.   Pt admitted to general surgery service.   ED Discharge Orders    None       Bishop Dublin 04/17/19 1830    Fatima Blank, MD 04/20/19 [phone]

## 2019-04-18 ENCOUNTER — Inpatient Hospital Stay: Admit: 2019-04-18 | Payer: Medicare PPO | Admitting: General Surgery

## 2019-04-18 ENCOUNTER — Observation Stay (HOSPITAL_COMMUNITY): Payer: Medicare PPO

## 2019-04-18 ENCOUNTER — Encounter (HOSPITAL_COMMUNITY)
Admission: EM | Discharge status: Against Medical Advice | Payer: Self-pay | Source: Home / Self Care | Attending: Emergency Medicine

## 2019-04-18 ENCOUNTER — Observation Stay (HOSPITAL_COMMUNITY): Payer: Medicare PPO | Admitting: Certified Registered"

## 2019-04-18 ENCOUNTER — Encounter (HOSPITAL_COMMUNITY): Payer: Self-pay | Admitting: Certified Registered"

## 2019-04-18 DIAGNOSIS — M1990 Unspecified osteoarthritis, unspecified site: Secondary | ICD-10-CM | POA: Diagnosis not present

## 2019-04-18 DIAGNOSIS — Z1159 Encounter for screening for other viral diseases: Secondary | ICD-10-CM | POA: Diagnosis not present

## 2019-04-18 DIAGNOSIS — K219 Gastro-esophageal reflux disease without esophagitis: Secondary | ICD-10-CM | POA: Diagnosis not present

## 2019-04-18 DIAGNOSIS — F319 Bipolar disorder, unspecified: Secondary | ICD-10-CM | POA: Diagnosis not present

## 2019-04-18 DIAGNOSIS — D509 Iron deficiency anemia, unspecified: Secondary | ICD-10-CM | POA: Diagnosis not present

## 2019-04-18 DIAGNOSIS — K8020 Calculus of gallbladder without cholecystitis without obstruction: Secondary | ICD-10-CM | POA: Diagnosis not present

## 2019-04-18 DIAGNOSIS — E119 Type 2 diabetes mellitus without complications: Secondary | ICD-10-CM | POA: Diagnosis not present

## 2019-04-18 DIAGNOSIS — Z79899 Other long term (current) drug therapy: Secondary | ICD-10-CM | POA: Diagnosis not present

## 2019-04-18 DIAGNOSIS — K810 Acute cholecystitis: Secondary | ICD-10-CM | POA: Diagnosis not present

## 2019-04-18 DIAGNOSIS — J449 Chronic obstructive pulmonary disease, unspecified: Secondary | ICD-10-CM | POA: Diagnosis not present

## 2019-04-18 DIAGNOSIS — K8012 Calculus of gallbladder with acute and chronic cholecystitis without obstruction: Secondary | ICD-10-CM | POA: Diagnosis not present

## 2019-04-18 DIAGNOSIS — F4310 Post-traumatic stress disorder, unspecified: Secondary | ICD-10-CM | POA: Diagnosis not present

## 2019-04-18 DIAGNOSIS — K8010 Calculus of gallbladder with chronic cholecystitis without obstruction: Secondary | ICD-10-CM | POA: Diagnosis not present

## 2019-04-18 HISTORY — PX: CHOLECYSTECTOMY: SHX55

## 2019-04-18 LAB — COMPREHENSIVE METABOLIC PANEL
ALT: 430 U/L — ABNORMAL HIGH (ref 0–44)
AST: 531 U/L — ABNORMAL HIGH (ref 15–41)
Albumin: 3.6 g/dL (ref 3.5–5.0)
Alkaline Phosphatase: 106 U/L (ref 38–126)
Anion gap: 9 (ref 5–15)
BUN: 6 mg/dL (ref 6–20)
CO2: 23 mmol/L (ref 22–32)
Calcium: 8.7 mg/dL — ABNORMAL LOW (ref 8.9–10.3)
Chloride: 105 mmol/L (ref 98–111)
Creatinine, Ser: 0.83 mg/dL (ref 0.44–1.00)
GFR calc Af Amer: >60 mL/min (ref >60)
GFR calc non Af Amer: >60 mL/min (ref >60)
Glucose, Bld: 89 mg/dL (ref 70–99)
Potassium: 3.7 mmol/L (ref 3.5–5.1)
Sodium: 137 mmol/L (ref 135–145)
Total Bilirubin: 2.3 mg/dL — ABNORMAL HIGH (ref 0.3–1.2)
Total Protein: 6.8 g/dL (ref 6.5–8.1)

## 2019-04-18 LAB — LIPASE, BLOOD: Lipase: 66 U/L — ABNORMAL HIGH (ref 11–51)

## 2019-04-18 LAB — CBC
HCT: 39.6 % (ref 36.0–46.0)
Hemoglobin: 12.4 g/dL (ref 12.0–15.0)
MCH: 26.3 pg (ref 26.0–34.0)
MCHC: 31.3 g/dL (ref 30.0–36.0)
MCV: 83.9 fL (ref 80.0–100.0)
Platelets: 238 10*3/uL (ref 150–400)
RBC: 4.72 MIL/uL (ref 3.87–5.11)
RDW: 15.7 % — ABNORMAL HIGH (ref 11.5–15.5)
WBC: 4.2 10*3/uL (ref 4.0–10.5)
nRBC: 0 % (ref 0.0–0.2)

## 2019-04-18 LAB — HIV ANTIBODY (ROUTINE TESTING W REFLEX): HIV Screen 4th Generation wRfx: NONREACTIVE

## 2019-04-18 SURGERY — LAPAROSCOPIC CHOLECYSTECTOMY WITH INTRAOPERATIVE CHOLANGIOGRAM
Anesthesia: General | Site: Abdomen

## 2019-04-18 MED ORDER — FENTANYL CITRATE (PF) 250 MCG/5ML IJ SOLN
INTRAMUSCULAR | Status: AC
  Filled 2019-04-18: qty 5

## 2019-04-18 MED ORDER — FENTANYL CITRATE (PF) 100 MCG/2ML IJ SOLN
INTRAMUSCULAR | Status: DC | PRN
  Administered 2019-04-18: 50 ug via INTRAVENOUS
  Administered 2019-04-18 (×2): 100 ug via INTRAVENOUS

## 2019-04-18 MED ORDER — DEXAMETHASONE SODIUM PHOSPHATE 10 MG/ML IJ SOLN
INTRAMUSCULAR | Status: AC
  Filled 2019-04-18: qty 1

## 2019-04-18 MED ORDER — LIDOCAINE 2% (20 MG/ML) 5 ML SYRINGE
INTRAMUSCULAR | Status: DC | PRN
  Administered 2019-04-18: 100 mg via INTRAVENOUS

## 2019-04-18 MED ORDER — SCOPOLAMINE 1 MG/3DAYS TD PT72
1.0000 | MEDICATED_PATCH | Freq: Once | TRANSDERMAL | Status: DC
  Administered 2019-04-18: 1.5 mg via TRANSDERMAL

## 2019-04-18 MED ORDER — SCOPOLAMINE 1 MG/3DAYS TD PT72
MEDICATED_PATCH | TRANSDERMAL | Status: AC
  Filled 2019-04-18: qty 1

## 2019-04-18 MED ORDER — SUGAMMADEX SODIUM 500 MG/5ML IV SOLN
INTRAVENOUS | Status: DC | PRN
  Administered 2019-04-18: 250 mg via INTRAVENOUS

## 2019-04-18 MED ORDER — PROPOFOL 10 MG/ML IV BOLUS
INTRAVENOUS | Status: DC | PRN
  Administered 2019-04-18: 200 mg via INTRAVENOUS

## 2019-04-18 MED ORDER — MIDAZOLAM HCL 5 MG/5ML IJ SOLN
INTRAMUSCULAR | Status: DC | PRN
  Administered 2019-04-18: 2 mg via INTRAVENOUS

## 2019-04-18 MED ORDER — BUPIVACAINE-EPINEPHRINE 0.25% -1:200000 IJ SOLN
INTRAMUSCULAR | Status: DC | PRN
  Administered 2019-04-18: 30 mL

## 2019-04-18 MED ORDER — LIDOCAINE 2% (20 MG/ML) 5 ML SYRINGE
INTRAMUSCULAR | Status: AC
  Filled 2019-04-18: qty 5

## 2019-04-18 MED ORDER — IOHEXOL 300 MG/ML  SOLN
INTRAMUSCULAR | Status: DC | PRN
  Administered 2019-04-18: 50 mL

## 2019-04-18 MED ORDER — PROMETHAZINE HCL 25 MG/ML IJ SOLN: 6.2500 mg | INTRAMUSCULAR | Status: DC | PRN

## 2019-04-18 MED ORDER — SUCCINYLCHOLINE CHLORIDE 200 MG/10ML IV SOSY
PREFILLED_SYRINGE | INTRAVENOUS | Status: AC
  Filled 2019-04-18: qty 10

## 2019-04-18 MED ORDER — DEXAMETHASONE SODIUM PHOSPHATE 10 MG/ML IJ SOLN
INTRAMUSCULAR | Status: DC | PRN
  Administered 2019-04-18: 10 mg via INTRAVENOUS

## 2019-04-18 MED ORDER — ONDANSETRON HCL 4 MG/2ML IJ SOLN
INTRAMUSCULAR | Status: AC
  Filled 2019-04-18: qty 2

## 2019-04-18 MED ORDER — SUCCINYLCHOLINE CHLORIDE 200 MG/10ML IV SOSY
PREFILLED_SYRINGE | INTRAVENOUS | Status: DC | PRN
  Administered 2019-04-18: 120 mg via INTRAVENOUS

## 2019-04-18 MED ORDER — LABETALOL HCL 5 MG/ML IV SOLN
INTRAVENOUS | Status: DC | PRN
  Administered 2019-04-18 (×2): 5 mg via INTRAVENOUS

## 2019-04-18 MED ORDER — SUGAMMADEX SODIUM 500 MG/5ML IV SOLN
INTRAVENOUS | Status: AC
  Filled 2019-04-18: qty 5

## 2019-04-18 MED ORDER — LACTATED RINGERS IV SOLN
INTRAVENOUS | Status: DC | PRN
  Administered 2019-04-18: 09:00:00 via INTRAVENOUS

## 2019-04-18 MED ORDER — ROCURONIUM BROMIDE 10 MG/ML (PF) SYRINGE
PREFILLED_SYRINGE | INTRAVENOUS | Status: DC | PRN
  Administered 2019-04-18: 40 mg via INTRAVENOUS

## 2019-04-18 MED ORDER — MIDAZOLAM HCL 2 MG/2ML IJ SOLN
INTRAMUSCULAR | Status: AC
  Filled 2019-04-18: qty 2

## 2019-04-18 MED ORDER — 0.9 % SODIUM CHLORIDE (POUR BTL) OPTIME
TOPICAL | Status: DC | PRN
  Administered 2019-04-18: 1000 mL

## 2019-04-18 MED ORDER — ROCURONIUM BROMIDE 10 MG/ML (PF) SYRINGE
PREFILLED_SYRINGE | INTRAVENOUS | Status: AC
  Filled 2019-04-18: qty 10

## 2019-04-18 MED ORDER — HYDROMORPHONE HCL 1 MG/ML IJ SOLN
INTRAMUSCULAR | Status: AC
  Filled 2019-04-18: qty 1

## 2019-04-18 MED ORDER — LACTATED RINGERS IV SOLN
INTRAVENOUS | Status: AC | PRN
  Administered 2019-04-18: 1000 mL

## 2019-04-18 MED ORDER — HYDROMORPHONE HCL 1 MG/ML IJ SOLN
0.2500 mg | INTRAMUSCULAR | Status: DC | PRN
  Administered 2019-04-18 (×3): 0.25 mg via INTRAVENOUS

## 2019-04-18 MED ORDER — PROPOFOL 10 MG/ML IV BOLUS
INTRAVENOUS | Status: AC
  Filled 2019-04-18: qty 20

## 2019-04-18 MED ORDER — MIDAZOLAM HCL 2 MG/2ML IJ SOLN: 0.5000 mg | Freq: Once | INTRAMUSCULAR | Status: DC | PRN

## 2019-04-18 MED ORDER — ONDANSETRON HCL 4 MG/2ML IJ SOLN
INTRAMUSCULAR | Status: DC | PRN
  Administered 2019-04-18: 4 mg via INTRAVENOUS

## 2019-04-18 MED ORDER — MEPERIDINE HCL 50 MG/ML IJ SOLN: 6.2500 mg | INTRAMUSCULAR | Status: DC | PRN

## 2019-04-18 MED ORDER — BUPIVACAINE-EPINEPHRINE (PF) 0.25% -1:200000 IJ SOLN
INTRAMUSCULAR | Status: AC
  Filled 2019-04-18: qty 30

## 2019-04-18 SURGICAL SUPPLY — 33 items
ADH SKN CLS APL DERMABOND .7 (GAUZE/BANDAGES/DRESSINGS) ×1
APL PRP STRL LF DISP 70% ISPRP (MISCELLANEOUS) ×1
APPLIER CLIP 5 13 M/L LIGAMAX5 (MISCELLANEOUS) ×2
APR CLP MED LRG 5 ANG JAW (MISCELLANEOUS) ×1
BAG SPEC RTRVL LRG 6X4 10 (ENDOMECHANICALS) ×1
CABLE HIGH FREQUENCY MONO STRZ (ELECTRODE) ×2 IMPLANT
CATH REDDICK CHOLANGI 4FR 50CM (CATHETERS) ×2 IMPLANT
CHLORAPREP W/TINT 26 (MISCELLANEOUS) ×2 IMPLANT
CLIP APPLIE 5 13 M/L LIGAMAX5 (MISCELLANEOUS) ×1 IMPLANT
COVER MAYO STAND STRL (DRAPES) ×2 IMPLANT
COVER WAND RF STERILE (DRAPES) IMPLANT
DECANTER SPIKE VIAL GLASS SM (MISCELLANEOUS) ×2 IMPLANT
DERMABOND ADVANCED (GAUZE/BANDAGES/DRESSINGS) ×1
DERMABOND ADVANCED .7 DNX12 (GAUZE/BANDAGES/DRESSINGS) ×1 IMPLANT
DRAPE C-ARM 42X120 X-RAY (DRAPES) ×2 IMPLANT
ELECT REM PT RETURN 15FT ADLT (MISCELLANEOUS) ×2 IMPLANT
GLOVE BIO SURGEON STRL SZ7.5 (GLOVE) ×2 IMPLANT
GOWN STRL REUS W/TWL XL LVL3 (GOWN DISPOSABLE) ×6 IMPLANT
HEMOSTAT SURGICEL 4X8 (HEMOSTASIS) IMPLANT
IV CATH 14GX2 1/4 (CATHETERS) ×2 IMPLANT
KIT BASIN OR (CUSTOM PROCEDURE TRAY) ×2 IMPLANT
KIT TURNOVER KIT A (KITS) IMPLANT
POUCH SPECIMEN RETRIEVAL 10MM (ENDOMECHANICALS) ×2 IMPLANT
SCISSORS LAP 5X35 DISP (ENDOMECHANICALS) ×2 IMPLANT
SET IRRIG TUBING LAPAROSCOPIC (IRRIGATION / IRRIGATOR) ×2 IMPLANT
SET TUBE SMOKE EVAC HIGH FLOW (TUBING) ×2 IMPLANT
SLEEVE XCEL OPT CAN 5 100 (ENDOMECHANICALS) ×4 IMPLANT
SUT MNCRL AB 4-0 PS2 18 (SUTURE) ×2 IMPLANT
TOWEL OR 17X26 10 PK STRL BLUE (TOWEL DISPOSABLE) ×2 IMPLANT
TOWEL OR NON WOVEN STRL DISP B (DISPOSABLE) ×2 IMPLANT
TRAY LAPAROSCOPIC (CUSTOM PROCEDURE TRAY) ×2 IMPLANT
TROCAR BLADELESS OPT 5 100 (ENDOMECHANICALS) ×2 IMPLANT
TROCAR XCEL BLUNT TIP 100MML (ENDOMECHANICALS) ×2 IMPLANT

## 2019-04-18 NOTE — Progress Notes (Signed)
Pt received from PACU. Surgical sites intact, pain well controlled. No problems noted. Will continue to monitor

## 2019-04-18 NOTE — Anesthesia Preprocedure Evaluation (Addendum)
Anesthesia Evaluation  Patient identified by MRN, date of birth, ID band Patient awake    Reviewed: Allergy & Precautions, NPO status , Patient's Chart, lab work & pertinent test results  History of Anesthesia Complications Negative for: history of anesthetic complications  Airway Mallampati: II  TM Distance: >3 FB Neck ROM: Full    Dental  (+) Edentulous Upper, Edentulous Lower   Pulmonary COPD,  COPD inhaler, former smoker (quit 12/2018),  04/17/2019 SARS coronavirus NEG   breath sounds clear to auscultation       Cardiovascular negative cardio ROS   Rhythm:Regular Rate:Normal  '18 ECHO: EF 55-60%, mild AI   Neuro/Psych Bipolar Disorder negative neurological ROS     GI/Hepatic GERD  Medicated and Poorly Controlled,Elevated LFTs with acute cholecystitis N/v with acute cholecystitis   Endo/Other  obese  Renal/GU      Musculoskeletal   Abdominal (+) + obese,   Peds  Hematology negative hematology ROS (+)   Anesthesia Other Findings H/o breast cancer: XRT  Reproductive/Obstetrics LMP 2 weeks ago                            Anesthesia Physical Anesthesia Plan  ASA: II  Anesthesia Plan: General   Post-op Pain Management:    Induction: Intravenous  PONV Risk Score and Plan: 4 or greater and Scopolamine patch - Pre-op, Dexamethasone and Ondansetron  Airway Management Planned: Oral ETT  Additional Equipment:   Intra-op Plan:   Post-operative Plan: Extubation in OR  Informed Consent: I have reviewed the patients History and Physical, chart, labs and discussed the procedure including the risks, benefits and alternatives for the proposed anesthesia with the patient or authorized representative who has indicated his/her understanding and acceptance.       Plan Discussed with: CRNA and Surgeon  Anesthesia Plan Comments:        Anesthesia Quick Evaluation

## 2019-04-18 NOTE — Anesthesia Postprocedure Evaluation (Signed)
Anesthesia Post Note  Patient: Kimberly [name]  Procedure(s) Performed: LAPAROSCOPIC CHOLECYSTECTOMY WITH INTRAOPERATIVE CHOLANGIOGRAM (N/A Abdomen)     Patient location during evaluation: PACU Anesthesia Type: General Level of consciousness: awake and alert, patient cooperative and oriented Pain management: pain level controlled Vital Signs Assessment: post-procedure vital signs reviewed and stable Respiratory status: spontaneous breathing, nonlabored ventilation and respiratory function stable Cardiovascular status: blood pressure returned to baseline and stable Postop Assessment: no apparent nausea or vomiting Anesthetic complications: no    Last Vitals:  Vitals:   04/18/19 1045 04/18/19 1115  BP: 121/76   Pulse:    Resp:    Temp:  36.8 C  SpO2:      Last Pain:  Vitals:   04/18/19 1100  TempSrc:   PainSc: 4                  Deem Marmol,E. Lala Been

## 2019-04-18 NOTE — Op Note (Addendum)
04/17/2019 - 04/18/2019  10:04 AM  PATIENT:  Kimberly [name]  48 y.o. female  PRE-OPERATIVE DIAGNOSIS:  ACUTE CHOLECYSTITIS  POST-OPERATIVE DIAGNOSIS:  acute cholecystitis with stones  PROCEDURE:  Procedure(s): LAPAROSCOPIC CHOLECYSTECTOMY WITH INTRAOPERATIVE CHOLANGIOGRAM (N/A)  SURGEON:  Surgeon(s) and Role:    * Jovita Kussmaul, MD - Primary    * Clovis Riley, MD - Assisting  PHYSICIAN ASSISTANT:   ASSISTANTS: Dr. Kae Heller   ANESTHESIA:   local and general  EBL:  minimal   BLOOD ADMINISTERED:none  DRAINS: none   LOCAL MEDICATIONS USED:  MARCAINE     SPECIMEN:  Source of Specimen:  gallbladder  DISPOSITION OF SPECIMEN:  PATHOLOGY  COUNTS:  YES  TOURNIQUET:  * No tourniquets in log *  DICTATION: .Dragon Dictation     Procedure: After informed consent was obtained the patient was brought to the operating room and placed in the supine position on the operating room table. After adequate induction of general anesthesia the patient's abdomen was prepped with ChloraPrep allowed to dry and draped in usual sterile manner. An appropriate timeout was performed. The area below the umbilicus was infiltrated with quarter percent  Marcaine. A small incision was made with a 15 blade knife. The incision was carried down through the subcutaneous tissue bluntly with a hemostat and [employer] retractors. The linea alba was identified. The linea alba was incised with a 15 blade knife and each side was grasped with Coker clamps. The preperitoneal space was then probed with a hemostat until the peritoneum was opened and access was gained to the abdominal cavity. A 0 Vicryl pursestring stitch was placed in the fascia surrounding the opening. A Hassan cannula was then placed through the opening and anchored in place with the previously placed Vicryl purse string stitch. The abdomen was insufflated with carbon dioxide without difficulty. A laparoscope was inserted through the [facility] cannula in  the right upper quadrant was inspected. Next the epigastric region was infiltrated with % Marcaine. A small incision was made with a 15 blade knife. A 5 mm port was placed bluntly through this incision into the abdominal cavity under direct vision. Next 2 sites were chosen laterally on the right side of the abdomen for placement of 5 mm ports. Each of these areas was infiltrated with quarter percent Marcaine. Small stab incisions were made with a 15 blade knife. 5 mm ports were then placed bluntly through these incisions into the abdominal cavity under direct vision without difficulty. A blunt grasper was placed through the lateralmost 5 mm port and used to grasp the dome of the gallbladder and elevated anteriorly and superiorly. Another blunt grasper was placed through the other 5 mm port and used to retract the body and neck of the gallbladder. The gallbladder was quite inflamed and thickwalled. A dissector was placed through the epigastric port and using the electrocautery the peritoneal reflection at the gallbladder neck was opened. Blunt dissection was then carried out in this area until the gallbladder neck-cystic duct junction was readily identified and a good window was created. A single clip was placed on the gallbladder neck. A small  ductotomy was made just below the clip with laparoscopic scissors. A 14-gauge Angiocath was then placed through the anterior abdominal wall under direct vision. A Reddick cholangiogram catheter was then placed through the Angiocath and flushed. The catheter was then placed in the cystic duct and anchored in place with a clip. A cholangiogram was obtained that showed no filling defects good emptying into  the duodenum an adequate length on the cystic duct. The anchoring clip and catheters were then removed from the patient. 3 clips were placed proximally on the cystic duct and the duct was divided between the 2 sets of clips. Posterior to this the cystic artery was identified  and again dissected bluntly in a circumferential manner until a good window  was created. 2 clips were placed proximally and one distally on the artery and the artery was divided between the 2 sets of clips. Next a laparoscopic hook cautery device was used to separate the gallbladder from the liver bed. Prior to completely detaching the gallbladder from the liver bed the liver bed was inspected and several small bleeding points were coagulated with the electrocautery until the area was completely hemostatic. The gallbladder was then detached the rest of it from the liver bed without difficulty. A laparoscopic bag was inserted through the hassan port. The laparoscope was moved to the epigastric port. The gallbladder was placed within the bag and the bag was sealed.  The bag with the gallbladder was then removed with the [facility] cannula through the infraumbilical port without difficulty. The fascial defect was then closed with the previously placed Vicryl pursestring stitch as well as with another figure-of-eight 0 Vicryl stitch. The liver bed was inspected again and found to be hemostatic. The abdomen was irrigated with copious amounts of saline until the effluent was clear. The ports were then removed under direct vision without difficulty and were found to be hemostatic. The gas was allowed to escape. The skin incisions were all closed with interrupted 4-0 Monocryl subcuticular stitches. Dermabond dressings were applied. The patient tolerated the procedure well. At the end of the case all needle sponge and instrument counts were correct. The patient was then awakened and taken to recovery in stable condition. The assistant was crucial for retraction and visualization during the case  PLAN OF CARE: Admit for overnight observation  PATIENT DISPOSITION:  PACU - hemodynamically stable.   Delay start of Pharmacological VTE agent (>24hrs) due to surgical blood loss or risk of bleeding: no

## 2019-04-18 NOTE — Progress Notes (Signed)
Pt requesting to leave AMA.  Pt stated " the doctor wont give me all my  medications and I want to sleep in my  bed tonight". Pt willing to sign AMA papers once explained. MD was notified. Pt was escorted by nurse tech to car of family member picking up patient.

## 2019-04-18 NOTE — Anesthesia Procedure Notes (Signed)
Procedure Name: Intubation Date/Time: 04/18/2019 9:09 AM Performed by: Alim Cattell D, CRNA Pre-anesthesia Checklist: Patient identified, Emergency Drugs available, Suction available and Patient being monitored Patient Re-evaluated:Patient Re-evaluated prior to induction Oxygen Delivery Method: Circle system utilized Preoxygenation: Pre-oxygenation with 100% oxygen Induction Type: IV induction, Rapid sequence and Cricoid Pressure applied Laryngoscope Size: Mac and 4 Grade View: Grade I Tube type: Oral Number of attempts: 1 Airway Equipment and Method: Stylet Placement Confirmation: ETT inserted through vocal cords under direct vision,  positive ETCO2 and breath sounds checked- equal and bilateral Secured at: 21 cm Tube secured with: Tape Dental Injury: Teeth and Oropharynx as per pre-operative assessment

## 2019-04-18 NOTE — Care Management Obs Status (Signed)
[facility] NOTIFICATION   Patient Details  Name: Kimberly [name] MRN: [id number] Date of Birth: 10/22/1971   Medicare Observation Status Notification Given:  Yes    Joaquin Courts, RN 04/18/2019, 2:38 PM

## 2019-04-18 NOTE — Interval H&P Note (Signed)
History and Physical Interval Note:  04/18/2019 8:29 AM  Kimberly [name]  has presented today for surgery, with the diagnosis of ACUTE CHOLECYSTITIS.  The various methods of treatment have been discussed with the patient and family. After consideration of risks, benefits and other options for treatment, the patient has consented to  Procedure(s): LAPAROSCOPIC CHOLECYSTECTOMY WITH INTRAOPERATIVE CHOLANGIOGRAM (N/A) as a surgical intervention.  The patient's history has been reviewed, patient examined, no change in status, stable for surgery.  I have reviewed the patient's chart and labs.  Questions were answered to the patient's satisfaction.     Autumn Messing III

## 2019-04-18 NOTE — Transfer of Care (Signed)
Immediate Anesthesia Transfer of Care Note  Patient: Kimberly [name]  Procedure(s) Performed: LAPAROSCOPIC CHOLECYSTECTOMY WITH INTRAOPERATIVE CHOLANGIOGRAM (N/A Abdomen)  Patient Location: PACU  Anesthesia Type:General  Level of Consciousness: awake, alert  and oriented  Airway & Oxygen Therapy: Patient Spontanous Breathing and Patient connected to face mask oxygen  Post-op Assessment: Report given to RN and Post -op Vital signs reviewed and stable  Post vital signs: Reviewed and stable  Last Vitals:  Vitals Value Taken Time  BP    Temp    Pulse    Resp    SpO2      Last Pain:  Vitals:   04/18/19 0628  TempSrc: Oral  PainSc:       Patients Stated Pain Goal: 2 (21/03/12 8118)  Complications: No apparent anesthesia complications

## 2019-04-19 ENCOUNTER — Encounter (HOSPITAL_COMMUNITY): Payer: Self-pay | Admitting: General Surgery

## 2019-04-22 DIAGNOSIS — F250 Schizoaffective disorder, bipolar type: Secondary | ICD-10-CM | POA: Diagnosis not present

## 2019-04-28 NOTE — Discharge Summary (Signed)
Patient ID: Kimberly [name] [id number] [birth year] 48 y.o.  Admit date: 04/17/2019 AMA leave date: 04/18/2019  Admitting Diagnosis: Acute Cholecystitis  Discharge Diagnosis Patient Active Problem List   Diagnosis Date Noted  . Acute cholecystitis 04/17/2019  . Hand injury, right, initial encounter 03/19/2019  . Total body pain 01/13/2019  . Blood in stool 01/13/2019  . Postmenopausal bleeding 10/25/2017  . Abscess of axilla, right 05/29/2017  . Chronic pain of both knees 05/03/2017  . Upper airway cough syndrome 03/27/2017  . Discogenic low back pain 03/20/2017  . COPD (chronic obstructive pulmonary disease) ([place]) 03/20/2017  . Tachycardia   . Dyspnea on exertion   . Grieving 11/21/2016  . Mixed incontinence 09/29/2015  . Genetic testing 08/26/2015  . Malignant neoplasm of upper-outer quadrant of left breast in female, estrogen receptor positive ([place]) 05/07/2014  . Disorder of bladder 09/29/2008  . LOW BACK PAIN, CHRONIC 09/29/2008  . Morbid (severe) obesity due to excess calories ([place]) 11/19/2007  . ANEMIA, IRON DEFICIENCY, CHRONIC 11/14/2007  . Mixed bipolar I disorder ([place]) 02/20/2007  . Cigarette smoker 02/20/2007    Consultants None  H&P: Kimberly [name] is a 48 y.o. female with a history is a history of breast cancer status post radiation and bilateral lumpectomy who presented to the emergency department with abdominal pain, nausea as well as vomiting. Patient reports she awoke this morning with epigastric abdominal pain that radiated to her right upper quadrant and bilateral flanks.  She reports that pain was mild at onset and has gradually worsened. Her pain is constant. She reports that she has associated chills, nausea and has had approximately 4 episodes of emesis.  She denies any aggravating or relieving factors.  She denies history of similar symptoms in the past.  She reports she was asymptomatic yesterday.  The patient denies any fever, cough, urinary  symptoms, constipation or diarrhea.  No prior abdominal surgeries.  She presented to the emergency department for evaluation.  In the emergency department she was found to be afebrile, heart rate 99-102, and hypertensive.  WBC 9100, AST 276, ALT 149, alk phos 75, T bili 0.8.  Lipase 24. RUQ ultrasound showed a 1.9 cm gallstone in the gallbladder neck.  There is borderline gallbladder wall thickening at 3.3 mm.  No pericholecystic fluid.  Common bile duct was 9 mm. General surgery was asked to see.   Procedures Laparoscopic Cholecystectomy with IOC, 6/27, Dr. Alden Benjamin Course:  The patient was admitted and underwent a laparoscopic cholecystectomy with IOC.  The patient tolerated the procedure well. On POD 0, the patient left AMA. Follow up was arranged for the office for 7/21 at 11:45am. The office will contact the patient with this appointment time.   Physical Exam: I was not present at the time of the patient leaving AMA  Allergies as of 04/18/2019   No Known Allergies     Medication List    ASK your doctor about these medications   albuterol 108 (90 Base) MCG/ACT inhaler Commonly known as: VENTOLIN HFA Inhale 2 puffs into the lungs every 6 (six) hours as needed for wheezing or shortness of breath.   busPIRone 15 MG tablet Commonly known as: BUSPAR Take 15 mg by mouth 3 (three) times daily.   clonazePAM 1 MG tablet Commonly known as: KLONOPIN Take 1 mg by mouth 2 (two) times daily as needed for anxiety.   doxepin 50 MG capsule Commonly known as: SINEQUAN Take 50 mg by mouth at bedtime.   FLUoxetine  40 MG capsule Commonly known as: PROZAC Take 80 mg by mouth daily.   ipratropium-albuterol 0.5-2.5 (3) MG/3ML Soln Commonly known as: DUONEB Take 3 mLs by nebulization every 4 (four) hours as needed.   lamoTRIgine 200 MG tablet Commonly known as: LAMICTAL Take 1 tablet (200 mg total) by mouth 2 (two) times daily.   Lurasidone HCl 60 MG Tabs Take 60 mg by mouth at  bedtime.   pantoprazole 40 MG tablet Commonly known as: PROTONIX TAKE 1 TABLET DAILY BY MOUTH 30 TO 60 MINUTES BEFORE FIRST MEAL OF THE DAY   polyethylene glycol 17 g packet Commonly known as: MIRALAX / GLYCOLAX Take 17 g by mouth daily.   prazosin 2 MG capsule Commonly known as: MINIPRESS Take 2 mg by mouth at bedtime as needed (nightmares).   QUEtiapine 100 MG tablet Commonly known as: SEROQUEL Take 100 mg by mouth at bedtime.   risperiDONE 3 MG tablet Commonly known as: RISPERDAL Take 1 tablet (3 mg total) by mouth at bedtime.        Follow-up Information    [facility] .   Specialty: Radiology Contact information: [address] [id number] [place] [phone]          Signed: Alferd Apa [facility] 04/28/2019, 2:34 PM Pager: [phone]

## 2019-05-14 ENCOUNTER — Encounter: Payer: Self-pay | Admitting: Family Medicine

## 2019-06-05 ENCOUNTER — Telehealth (INDEPENDENT_AMBULATORY_CARE_PROVIDER_SITE_OTHER): Payer: Medicare PPO | Admitting: Family Medicine

## 2019-06-05 ENCOUNTER — Other Ambulatory Visit: Payer: Self-pay

## 2019-06-05 VITALS — Temp 102.0°F

## 2019-06-05 DIAGNOSIS — U071 COVID-19: Secondary | ICD-10-CM | POA: Diagnosis not present

## 2019-06-05 NOTE — Progress Notes (Signed)
[facility] Telemedicine Visit  Patient consented to have virtual visit. Method of visit: Video  Encounter participants: Patient: Kimberly [name] - located at home Provider: Kathrene Alu - located at [facility] Others (if applicable): none  Chief Complaint: cough, fever  HPI: Patient reports that she has had a cough, sore throat, and fever up to 102 F since 2 days ago.  She has a son that has similar symptoms, and another son who has recently tested positive for COVID-19.  She has had posttussive emesis but no other GI problems.  She has shortness of breath when she coughs a lot but denies shortness of breath at other times.  She has been able to drink ginger ale.    ROS: per HPI  Pertinent PMHx: COPD, morbid obesity, tobacco use disorder, anemia  Exam:  General: Appears anxious and tired, although she becomes less anxious during our conversation Respiratory: Able to speak in complete sentences without shortness of breath, occasional coughing heard  Assessment/Plan:  COVID-19 virus infection Due to patient's close exposure of her son who has tested positive for COVID-19 as well as her symptoms of cough and fever, patient likely has COVID infection.  Patient was told that if she desires, she can get tested, but this is not required as she clinically has COVID-19.  She was counseled to continue hydrating and to rest as much as possible.  She was told that we do not have any medications that cure this illness, but she should take care of her body to allow it to fight off the virus.  She was told to stay quarantined for 2 weeks and to wash her hands frequently.  She was counseled that if she experiences shortness of breath and cannot stay hydrated, she should go to the emergency department.  She expressed understanding of this plan.    Time spent during visit with patient: 8 minutes

## 2019-06-07 DIAGNOSIS — U071 COVID-19: Secondary | ICD-10-CM

## 2019-06-07 HISTORY — DX: COVID-19: U07.1

## 2019-06-07 NOTE — Assessment & Plan Note (Signed)
Due to patient's close exposure of her son who has tested positive for COVID-19 as well as her symptoms of cough and fever, patient likely has COVID infection.  Patient was told that if she desires, she can get tested, but this is not required as she clinically has COVID-19.  She was counseled to continue hydrating and to rest as much as possible.  She was told that we do not have any medications that cure this illness, but she should take care of her body to allow it to fight off the virus.  She was told to stay quarantined for 2 weeks and to wash her hands frequently.  She was counseled that if she experiences shortness of breath and cannot stay hydrated, she should go to the emergency department.  She expressed understanding of this plan.

## 2019-06-09 ENCOUNTER — Other Ambulatory Visit: Payer: Self-pay | Admitting: *Deleted

## 2019-06-09 MED ORDER — IPRATROPIUM-ALBUTEROL 0.5-2.5 (3) MG/3ML IN SOLN: 3.0000 mL | RESPIRATORY_TRACT | 3 refills | Status: DC | PRN

## 2019-06-22 DIAGNOSIS — F250 Schizoaffective disorder, bipolar type: Secondary | ICD-10-CM | POA: Diagnosis not present

## 2019-08-03 DIAGNOSIS — Z03818 Encounter for observation for suspected exposure to other biological agents ruled out: Secondary | ICD-10-CM | POA: Diagnosis not present

## 2019-08-03 DIAGNOSIS — Z20828 Contact with and (suspected) exposure to other viral communicable diseases: Secondary | ICD-10-CM | POA: Diagnosis not present

## 2019-08-07 DIAGNOSIS — F315 Bipolar disorder, current episode depressed, severe, with psychotic features: Secondary | ICD-10-CM | POA: Diagnosis not present

## 2019-08-11 DIAGNOSIS — Z20828 Contact with and (suspected) exposure to other viral communicable diseases: Secondary | ICD-10-CM | POA: Diagnosis not present

## 2019-08-13 DIAGNOSIS — F315 Bipolar disorder, current episode depressed, severe, with psychotic features: Secondary | ICD-10-CM | POA: Diagnosis not present

## 2019-08-14 ENCOUNTER — Telehealth: Payer: Self-pay

## 2019-08-14 NOTE — Telephone Encounter (Signed)
Mother came in with daughter that had appointment today, so the mother asked for her flu shot as well. Salvatore Marvel, CMA

## 2019-08-27 DIAGNOSIS — F315 Bipolar disorder, current episode depressed, severe, with psychotic features: Secondary | ICD-10-CM | POA: Diagnosis not present

## 2019-09-10 DIAGNOSIS — F315 Bipolar disorder, current episode depressed, severe, with psychotic features: Secondary | ICD-10-CM | POA: Diagnosis not present

## 2019-10-01 DIAGNOSIS — F17200 Nicotine dependence, unspecified, uncomplicated: Secondary | ICD-10-CM | POA: Diagnosis not present

## 2019-10-01 DIAGNOSIS — F315 Bipolar disorder, current episode depressed, severe, with psychotic features: Secondary | ICD-10-CM | POA: Diagnosis not present

## 2019-11-17 DIAGNOSIS — F315 Bipolar disorder, current episode depressed, severe, with psychotic features: Secondary | ICD-10-CM | POA: Diagnosis not present

## 2019-11-26 DIAGNOSIS — F17200 Nicotine dependence, unspecified, uncomplicated: Secondary | ICD-10-CM | POA: Diagnosis not present

## 2019-12-08 DIAGNOSIS — F315 Bipolar disorder, current episode depressed, severe, with psychotic features: Secondary | ICD-10-CM | POA: Diagnosis not present

## 2019-12-31 DIAGNOSIS — F315 Bipolar disorder, current episode depressed, severe, with psychotic features: Secondary | ICD-10-CM | POA: Diagnosis not present

## 2020-02-11 ENCOUNTER — Ambulatory Visit (INDEPENDENT_AMBULATORY_CARE_PROVIDER_SITE_OTHER): Payer: Medicare HMO | Admitting: Family Medicine

## 2020-02-11 ENCOUNTER — Other Ambulatory Visit: Payer: Self-pay

## 2020-02-11 ENCOUNTER — Encounter: Payer: Self-pay | Admitting: Family Medicine

## 2020-02-11 VITALS — BP 115/75 | HR 110 | Temp 97.8°F | Wt 313.0 lb

## 2020-02-11 DIAGNOSIS — Z124 Encounter for screening for malignant neoplasm of cervix: Secondary | ICD-10-CM

## 2020-02-11 DIAGNOSIS — Z853 Personal history of malignant neoplasm of breast: Secondary | ICD-10-CM | POA: Diagnosis not present

## 2020-02-11 DIAGNOSIS — R7989 Other specified abnormal findings of blood chemistry: Secondary | ICD-10-CM | POA: Diagnosis not present

## 2020-02-11 DIAGNOSIS — F3160 Bipolar disorder, current episode mixed, unspecified: Secondary | ICD-10-CM

## 2020-02-11 DIAGNOSIS — R7309 Other abnormal glucose: Secondary | ICD-10-CM

## 2020-02-11 LAB — POCT GLYCOSYLATED HEMOGLOBIN (HGB A1C): HbA1c, POC (controlled diabetic range): 5.5 % (ref 0.0–7.0)

## 2020-02-11 MED ORDER — CLONAZEPAM 1 MG PO TABS: 1.0000 mg | ORAL_TABLET | Freq: Two times a day (BID) | ORAL | 0 refills | Status: DC | PRN

## 2020-02-11 MED ORDER — LURASIDONE HCL 60 MG PO TABS: 60.0000 mg | ORAL_TABLET | Freq: Every day | ORAL | 0 refills | Status: DC

## 2020-02-11 NOTE — Assessment & Plan Note (Signed)
Acutely worsened 2/2 medication adjustments and recent exacerbations in chronic stressors. Well connected with counseling and without current SI or plan. Will provide 2 weeks of latuda and klonopin refills until she is able to get to psychiatry appointment. Reviewed emergency suicide precautions. Protective factors include personal faith, children in the home, no access to firearms. Follow up in one week.

## 2020-02-11 NOTE — Patient Instructions (Addendum)
It was great to see you! I am so sorry you are going through this.   Our plans for today:  - I gave you a 2 week refill of your medications until you can see your psychiatrist.  - Come back next week so we can check in on how you are doing. - Make an appointment later for your pap smear.  - I am so glad you are getting therapy with [facility]. Call the suicide hotline or 911 if you have overwhelming thoughts of self-harm or plan to act on them. See the information below on resources.   We are checking some labs today, we will call you or send you a letter if they are abnormal.   Take care and seek immediate care sooner if you develop any concerns.   Dr. Johnsie [facility]   . [place]  [phone] (after hours)  . [facility]   [phone]    [facility]  [phone]   . [place] National Suicide Hotline  (903)758-4617 ([place])  . Call 911 or go to emergency room  . [employer]  [phone]);  [place] [facility] [employer]  [address], [place]   [place], [place] [address]      [phone]
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
## 2020-02-12 ENCOUNTER — Telehealth: Payer: Self-pay

## 2020-02-12 LAB — COMPREHENSIVE METABOLIC PANEL
ALT: 10 IU/L (ref 0–32)
AST: 15 IU/L (ref 0–40)
Albumin/Globulin Ratio: 1.6 (ref 1.2–2.2)
Albumin: 4.4 g/dL (ref 3.8–4.8)
Alkaline Phosphatase: 64 IU/L (ref 39–117)
BUN/Creatinine Ratio: 3 — ABNORMAL LOW (ref 9–23)
BUN: 3 mg/dL — ABNORMAL LOW (ref 6–24)
Bilirubin Total: 0.2 mg/dL (ref 0.0–1.2)
CO2: 20 mmol/L (ref 20–29)
Calcium: 9.1 mg/dL (ref 8.7–10.2)
Chloride: 103 mmol/L (ref 96–106)
Creatinine, Ser: 0.98 mg/dL (ref 0.57–1.00)
GFR calc Af Amer: 79 mL/min/{1.73_m2} (ref >59)
GFR calc non Af Amer: 68 mL/min/{1.73_m2} (ref >59)
Globulin, Total: 2.8 g/dL (ref 1.5–4.5)
Glucose: 106 mg/dL — ABNORMAL HIGH (ref 65–99)
Potassium: 3.9 mmol/L (ref 3.5–5.2)
Sodium: 137 mmol/L (ref 134–144)
Total Protein: 7.2 g/dL (ref 6.0–8.5)

## 2020-02-12 LAB — LIPID PANEL
Chol/HDL Ratio: 4.7 ratio — ABNORMAL HIGH (ref 0.0–4.4)
Cholesterol, Total: 175 mg/dL (ref 100–199)
HDL: 37 mg/dL — ABNORMAL LOW (ref >39)
LDL Chol Calc (NIH): 111 mg/dL — ABNORMAL HIGH (ref 0–99)
Triglycerides: 154 mg/dL — ABNORMAL HIGH (ref 0–149)
VLDL Cholesterol Cal: 27 mg/dL (ref 5–40)

## 2020-02-12 NOTE — Telephone Encounter (Signed)
Received fax from [facility] requesting prior authorization of Latuda. Clinical questions placed in providers box for completion.

## 2020-02-17 ENCOUNTER — Other Ambulatory Visit: Payer: Self-pay | Admitting: Family Medicine

## 2020-02-17 DIAGNOSIS — Z853 Personal history of malignant neoplasm of breast: Secondary | ICD-10-CM

## 2020-02-18 ENCOUNTER — Other Ambulatory Visit: Payer: Self-pay | Admitting: Family Medicine

## 2020-02-18 NOTE — Telephone Encounter (Signed)
Clinical questions faxed to covermymeds. Will await determination.

## 2020-02-19 ENCOUNTER — Ambulatory Visit: Payer: Medicare HMO | Admitting: Family Medicine

## 2020-02-19 NOTE — Progress Notes (Deleted)
    SUBJECTIVE:   CHIEF COMPLAINT: follow up  HPI:   Seen 4/22 with worsened bipolar d/o, was out of bipolar medication (klonopin, latuda) at that time with recent exacerbations in chronic stressors. Refilled meds and recommended 1 wk f/u. Had psychiatry f/u. No SI at that time.  Presents today for follow up.    PERTINENT  PMH / PSH: COPD, morbid obesity, iron deficiency anemia, bipolar disorder, tobacco use, history of breast cancer, mixed incontinence  OBJECTIVE:   LMP 02/11/2020   ***  ASSESSMENT/PLAN:   No problem-specific Assessment & Plan notes found for this encounter.     Rory Percy, [place]

## 2020-02-23 ENCOUNTER — Ambulatory Visit (INDEPENDENT_AMBULATORY_CARE_PROVIDER_SITE_OTHER): Payer: Medicare HMO | Admitting: Family Medicine

## 2020-02-23 ENCOUNTER — Encounter: Payer: Self-pay | Admitting: Family Medicine

## 2020-02-23 ENCOUNTER — Other Ambulatory Visit: Payer: Self-pay

## 2020-02-23 VITALS — BP 120/70 | HR 111 | Ht 69.0 in | Wt 314.2 lb

## 2020-02-23 DIAGNOSIS — M25562 Pain in left knee: Secondary | ICD-10-CM

## 2020-02-23 DIAGNOSIS — F3160 Bipolar disorder, current episode mixed, unspecified: Secondary | ICD-10-CM | POA: Diagnosis not present

## 2020-02-23 DIAGNOSIS — M25561 Pain in right knee: Secondary | ICD-10-CM

## 2020-02-23 DIAGNOSIS — G8929 Other chronic pain: Secondary | ICD-10-CM

## 2020-02-23 MED ORDER — METHYLPREDNISOLONE ACETATE 40 MG/ML IJ SUSP
40.0000 mg | Freq: Once | INTRAMUSCULAR | Status: AC
  Administered 2020-02-23: 40 mg via INTRA_ARTICULAR

## 2020-02-23 NOTE — Progress Notes (Signed)
    SUBJECTIVE:   CHIEF COMPLAINT / HPI:   Bipolar f/u - Seen last week with exacerbation of depression. Rx given for klonopin and latuda refill. Hasn't been able to pick up latuda from pharmacy, still awaiting results of prior auth. Compliant with other antidepressants. Has appointment with [facility] on 5/6. Klonopin has helped with depression. Denies SI/HI.  R Knee pain - has h/o chronic knee pain. Currently having worsened R knee pain after being on floor recently. Hard to flex knee fully. Hurts along anterior joint line. R>L. Hasn't tried any meds yet. Previous steroid shot with relief. Pain feels similar to prior.   PERTINENT  PMH / PSH: COPD, IDA, chronic knee pain, chronic LBP, h/o breast cancer, mixed bipolar d/o, tobacco use  OBJECTIVE:   BP 120/70   Pulse (!) 111   Ht 5\' 9"  (1.753 m)   Wt (!) 314 lb 4 oz (142.5 kg)   LMP 02/11/2020   SpO2 97%   BMI 46.41 kg/m   Gen: obese, agitated appearing, in NAD MSK: tenderness to palpation along medial and lateral joint lines of R knee. No swelling or overlying skin changes appreciated. Decreased AROM in flexion. No joint laxity. +modified [place].  INJECTION: Patient was given informed consent, signed copy in the chart. Appropriate time out was taken. Area prepped and draped in usual sterile fashion. 1 cc of methylprednisolone 40 mg/ml plus  4 cc of 1% lidocaine without epinephrine was injected into the R knee using a(n) anterior medial approach. The patient tolerated the procedure well. There were no complications. Post procedure instructions were given.    Depression screen [facility] 2/9 02/23/2020 02/23/2020 02/11/2020  Decreased Interest 1 1 1   Down, Depressed, Hopeless 1 1 1   PHQ - 2 Score 2 2 2   Altered sleeping 3 - 3  Tired, decreased energy 3 - 3  Change in appetite 3 - 3  Feeling bad or failure about yourself  1 - 1  Trouble concentrating 3 - 2  Moving slowly or fidgety/restless 3 - 3  Suicidal thoughts 0 - 1  PHQ-9 Score 18 - -   Difficult doing work/chores - - -  Some recent data might be hidden     ASSESSMENT/PLAN:   Mixed bipolar I disorder (HCC) Subjective slight improvement with klonopin although largely unchanged, PHQ score same as last week. Unable to obtain latuda. Personally called pharmacy, PA has been approved but will cost patient ~$230. Has psych f/u tomorrow, advised she discuss medication changes and worsened depression at appointment. Currently without SI/HI.  Chronic pain of both knees Chronic, likely exacerbated by pressure when bending on floor recently, otherwise no h/o trauma and no findings on exam to suggest acute fracture. No prior XR available for review, will obtain standing A/P as well as sunrise views to evaluate joint space. Likely arthritic in nature given longstanding history, obesity, and prior relief with steroid injection. Also consider degenerative meniscal tear. Intraarticular steroid injection provided today, see procedure note above. Would also benefit from weight loss and leg strengthening however currently uncontrolled psychiatric comorbidity prevents much success with this.      Rory Percy, [place]

## 2020-02-23 NOTE — Patient Instructions (Signed)
It was great to see you!  Our plans for today:  - We gave you a shot for your knee pain today. - I will call the pharmacy about your latuda. - Keep your appointment with [facility]. - Make an appointment to update your pap smear.   Take care and seek immediate care sooner if you develop any concerns.   Dr. [facility]

## 2020-02-25 NOTE — Assessment & Plan Note (Signed)
Subjective slight improvement with klonopin although largely unchanged, PHQ score same as last week. Unable to obtain latuda. Personally called pharmacy, PA has been approved but will cost patient ~$230. Has psych f/u tomorrow, advised she discuss medication changes and worsened depression at appointment. Currently without SI/HI.

## 2020-02-25 NOTE — Assessment & Plan Note (Signed)
Chronic, likely exacerbated by pressure when bending on floor recently, otherwise no h/o trauma and no findings on exam to suggest acute fracture. No prior XR available for review, will obtain standing A/P as well as sunrise views to evaluate joint space. Likely arthritic in nature given longstanding history, obesity, and prior relief with steroid injection. Also consider degenerative meniscal tear. Intraarticular steroid injection provided today, see procedure note above. Would also benefit from weight loss and leg strengthening however currently uncontrolled psychiatric comorbidity prevents much success with this.

## 2020-03-04 ENCOUNTER — Ambulatory Visit: Payer: Medicare HMO | Admitting: Family Medicine

## 2020-03-08 DIAGNOSIS — F315 Bipolar disorder, current episode depressed, severe, with psychotic features: Secondary | ICD-10-CM | POA: Diagnosis not present

## 2020-03-09 ENCOUNTER — Ambulatory Visit
Admission: RE | Admit: 2020-03-09 | Discharge: 2020-03-09 | Disposition: A | Discharge status: Home / Self Care | Payer: Medicare HMO | Source: Ambulatory Visit | Attending: Family Medicine | Admitting: Family Medicine

## 2020-03-09 ENCOUNTER — Other Ambulatory Visit: Payer: Self-pay

## 2020-03-09 ENCOUNTER — Other Ambulatory Visit: Payer: Self-pay | Admitting: Family Medicine

## 2020-03-09 DIAGNOSIS — N6310 Unspecified lump in the right breast, unspecified quadrant: Secondary | ICD-10-CM

## 2020-03-09 DIAGNOSIS — R922 Inconclusive mammogram: Secondary | ICD-10-CM | POA: Diagnosis not present

## 2020-03-09 DIAGNOSIS — Z853 Personal history of malignant neoplasm of breast: Secondary | ICD-10-CM

## 2020-03-09 DIAGNOSIS — N6011 Diffuse cystic mastopathy of right breast: Secondary | ICD-10-CM | POA: Diagnosis not present

## 2020-03-09 HISTORY — DX: Personal history of irradiation: Z92.3

## 2020-03-09 HISTORY — DX: Personal history of antineoplastic chemotherapy: Z92.21

## 2020-03-10 ENCOUNTER — Ambulatory Visit (INDEPENDENT_AMBULATORY_CARE_PROVIDER_SITE_OTHER): Payer: Medicare HMO | Admitting: Family Medicine

## 2020-03-10 ENCOUNTER — Other Ambulatory Visit: Payer: Self-pay

## 2020-03-10 VITALS — BP 120/90 | HR 99 | Temp 97.5°F | Wt 315.0 lb

## 2020-03-10 DIAGNOSIS — R0602 Shortness of breath: Secondary | ICD-10-CM

## 2020-03-10 DIAGNOSIS — R32 Unspecified urinary incontinence: Secondary | ICD-10-CM | POA: Diagnosis not present

## 2020-03-10 DIAGNOSIS — R34 Anuria and oliguria: Secondary | ICD-10-CM

## 2020-03-10 LAB — POCT URINALYSIS DIP (MANUAL ENTRY)
Bilirubin, UA: NEGATIVE
Glucose, UA: NEGATIVE mg/dL
Ketones, POC UA: NEGATIVE mg/dL
Leukocytes, UA: NEGATIVE
Nitrite, UA: NEGATIVE
Protein Ur, POC: 30 mg/dL — AB
Spec Grav, UA: 1.015 (ref 1.010–1.025)
Urobilinogen, UA: 0.2 E.U./dL
pH, UA: 7 (ref 5.0–8.0)

## 2020-03-10 LAB — POCT UA - MICROSCOPIC ONLY

## 2020-03-10 NOTE — Progress Notes (Signed)
    SUBJECTIVE:   CHIEF COMPLAINT / HPI: Infrequent urination  Infrequent urination Kimberly [name] has presented to clinic today because she is concerned that she is not urinating very much.  She reports that she is only passing urine about 1 time per day.  Additionally, she notes that her urine seems to be dark [place] in color.  She did not note any particular malodor.  She denies any dysuria.  She has noted some mild lower stomach pain.  In addition to her urinary issues, she is also noted some lower extremity swelling with occasional involvement of her upper extremities.  Yesterday evening, she did experience some moderate nausea without vomiting which seem to be gone by the morning.  Kimberly [name] had a number of additional issues on her review of systems which included increase dyspnea on exertion.  She has not noticed any worsened orthopnea (persistent orthopnea at baseline which does not seem to have worsened lately.  She did not report any chest pain.  PERTINENT  PMH / PSH: Mixed incontinence, diastolic heart failure  OBJECTIVE:   BP 120/90   Pulse 99   Temp (!) 97.5 F (36.4 C)   Wt (!) 315 lb (142.9 kg)   LMP 02/11/2020   SpO2 97%   BMI 46.52 kg/m    General: Obese, middle-aged woman seated comfortably in her exam chair.  Very anxious appearing.  Tangential speech.  Able to walk around the exam room, up onto the exam table without issue. HEENT: No significant JVD.  No obvious orbital edema. Cardiac: Regular rate and rhythm.  No murmur appreciated. Respiratory: Breathing comfortably on room air.  No wheezing/rales/rhonchi Abdomen: Protuberant stomach.  Soft, nontender to palpation.  No significant suprapubic pain.  No palpable bladder on exam. Extremities: 3+ pitting edema noted to the tibial tuberosity.  Warm, dry Skin: No evidence of rashes or petechiae.   ASSESSMENT/PLAN:   Oligouria Unclear etiology at this time.  The differential includes urinary retention, kidney stone,  acute kidney failure heart failure is also on the differential although relatively low suspicion, anticholinergic effect from doxepin.  Last kidney function tests from 4/22 showed normal kidney function with a creatinine of 0.98.  UA performed in clinic today showed a large amount of hemoglobin and protein.  No nitrates or leukocytes.  Low suspicion for UTI at this time.  Overall, not ill-appearing.  We will plan to move forward with basic labs including BMP and BNP today and follow-up with those results. -Follow-up BMP, BNP     Matilde Haymaker, MD [place]

## 2020-03-10 NOTE — Assessment & Plan Note (Addendum)
Unclear etiology at this time.  The differential includes urinary retention, kidney stone, acute kidney failure heart failure is also on the differential although relatively low suspicion, anticholinergic effect from doxepin.  Last kidney function tests from 4/22 showed normal kidney function with a creatinine of 0.98.  UA performed in clinic today showed a large amount of hemoglobin and protein.  No nitrates or leukocytes.  Low suspicion for UTI at this time.  Overall, not ill-appearing.  We will plan to move forward with basic labs including BMP and BNP today and follow-up with those results. -Follow-up BMP, BNP

## 2020-03-10 NOTE — Patient Instructions (Signed)
Thank you for coming in today Kimberly [name].  I am not positive what the issue is but I do want to run some test to look at your kidneys.  We will get some blood work today.  I will also send you home with a cup so that you can take a urine sample.  If you can provide a urine sample at home, please bring it to the clinic within 1 hour if we are open.  If we are not open at the time you collect the urine, please start in the refrigerator and bring it to the clinic within 24 hours.

## 2020-03-11 LAB — BASIC METABOLIC PANEL
BUN/Creatinine Ratio: 8 — ABNORMAL LOW (ref 9–23)
BUN: 8 mg/dL (ref 6–24)
CO2: 24 mmol/L (ref 20–29)
Calcium: 9.7 mg/dL (ref 8.7–10.2)
Chloride: 101 mmol/L (ref 96–106)
Creatinine, Ser: 1.01 mg/dL — ABNORMAL HIGH (ref 0.57–1.00)
GFR calc Af Amer: 76 mL/min/{1.73_m2} (ref >59)
GFR calc non Af Amer: 66 mL/min/{1.73_m2} (ref >59)
Glucose: 73 mg/dL (ref 65–99)
Potassium: 4 mmol/L (ref 3.5–5.2)
Sodium: 138 mmol/L (ref 134–144)

## 2020-03-11 LAB — BRAIN NATRIURETIC PEPTIDE: BNP: 10.1 pg/mL (ref 0.0–100.0)

## 2020-03-14 ENCOUNTER — Encounter: Payer: Self-pay | Admitting: Family Medicine

## 2020-03-24 DIAGNOSIS — F315 Bipolar disorder, current episode depressed, severe, with psychotic features: Secondary | ICD-10-CM | POA: Diagnosis not present

## 2020-07-15 ENCOUNTER — Other Ambulatory Visit: Payer: Self-pay

## 2020-07-15 ENCOUNTER — Encounter (HOSPITAL_COMMUNITY): Payer: Self-pay

## 2020-07-15 ENCOUNTER — Ambulatory Visit (HOSPITAL_COMMUNITY)
Admission: EM | Admit: 2020-07-15 | Discharge: 2020-07-15 | Disposition: A | Discharge status: Home / Self Care | Payer: Medicare Other | Attending: Internal Medicine | Admitting: Internal Medicine

## 2020-07-15 DIAGNOSIS — J449 Chronic obstructive pulmonary disease, unspecified: Secondary | ICD-10-CM | POA: Diagnosis not present

## 2020-07-15 DIAGNOSIS — U071 COVID-19: Secondary | ICD-10-CM | POA: Insufficient documentation

## 2020-07-15 DIAGNOSIS — E119 Type 2 diabetes mellitus without complications: Secondary | ICD-10-CM | POA: Diagnosis not present

## 2020-07-15 DIAGNOSIS — Z79899 Other long term (current) drug therapy: Secondary | ICD-10-CM | POA: Diagnosis not present

## 2020-07-15 DIAGNOSIS — F319 Bipolar disorder, unspecified: Secondary | ICD-10-CM | POA: Diagnosis not present

## 2020-07-15 DIAGNOSIS — Z6841 Body Mass Index (BMI) 40.0 and over, adult: Secondary | ICD-10-CM | POA: Diagnosis not present

## 2020-07-15 DIAGNOSIS — Z87891 Personal history of nicotine dependence: Secondary | ICD-10-CM | POA: Insufficient documentation

## 2020-07-15 DIAGNOSIS — B349 Viral infection, unspecified: Secondary | ICD-10-CM

## 2020-07-15 DIAGNOSIS — Z853 Personal history of malignant neoplasm of breast: Secondary | ICD-10-CM | POA: Insufficient documentation

## 2020-07-15 DIAGNOSIS — M1990 Unspecified osteoarthritis, unspecified site: Secondary | ICD-10-CM | POA: Insufficient documentation

## 2020-07-15 DIAGNOSIS — G8929 Other chronic pain: Secondary | ICD-10-CM | POA: Diagnosis not present

## 2020-07-15 DIAGNOSIS — Z20822 Contact with and (suspected) exposure to covid-19: Secondary | ICD-10-CM | POA: Diagnosis present

## 2020-07-15 DIAGNOSIS — M545 Low back pain: Secondary | ICD-10-CM | POA: Insufficient documentation

## 2020-07-15 MED ORDER — ONDANSETRON HCL 4 MG PO TABS: 4.0000 mg | ORAL_TABLET | Freq: Three times a day (TID) | ORAL | 0 refills | Status: DC | PRN

## 2020-07-15 MED ORDER — LOPERAMIDE HCL 2 MG PO CAPS: 2.0000 mg | ORAL_CAPSULE | Freq: Every day | ORAL | 0 refills | Status: AC

## 2020-07-15 MED ORDER — ACETAMINOPHEN 325 MG PO TABS: 650.0000 mg | ORAL_TABLET | Freq: Four times a day (QID) | ORAL | 0 refills | Status: DC | PRN

## 2020-07-15 MED ORDER — FLUTICASONE PROPIONATE 50 MCG/ACT NA SUSP: 1.0000 | Freq: Every day | NASAL | 2 refills | Status: DC

## 2020-07-15 MED ORDER — BENZONATATE 100 MG PO CAPS: 100.0000 mg | ORAL_CAPSULE | Freq: Three times a day (TID) | ORAL | 0 refills | Status: DC

## 2020-07-15 NOTE — ED Triage Notes (Signed)
Pt c/o N/V/D, body aches, chills and feverx5 days. Pt states vomited 2 times and had diarrhea 20 times in the past 5 days. PT states highest fever was 103. Pt states son and daughter tested COVID + yesterday.

## 2020-07-15 NOTE — Discharge Instructions (Signed)
Take the medications as prescribed -Only take the Zofran if you are unable to tolerate fluids, or feeling very nauseous -You may consider taking the Imodium once a day if bowels are not swelling -Take the Tessalon for cough -Take Tylenol for pain and body ache  If severely worsening symptoms with shortness of breath, high fevers, severe belly pain, blood in your stool or very dark color stools or other concerning symptoms go to the emergency department  Follow-up with your primary care as needed  If your Covid-19 test is positive, you will receive a phone call from [facility] regarding your results. Negative test results are not called. Both positive and negative results area always visible on MyChart. If you do not have a MyChart account, sign up instructions are in your discharge papers.   Persons who are directed to care for themselves at home may discontinue isolation under the following conditions:   At least 10 days have passed since symptom onset and  At least 24 hours have passed without running a fever (this means without the use of fever-reducing medications) and  Other symptoms have improved.  Persons infected with COVID-19 who never develop symptoms may discontinue isolation and other precautions 10 days after the date of their first positive COVID-19 test.

## 2020-07-15 NOTE — ED Provider Notes (Signed)
[facility]    CSN: [id number] Arrival date & time: 07/15/20  1142      History   Chief Complaint Chief Complaint  Patient presents with  . Emesis    HPI Kimberly [name] is a 49 y.o. female.   Patient reports for Covid testing due to Covid symptoms of cough, runny nose, vomiting and episodes of loose stool.  She reports she has had a dry cough at night runny nose.  She is also had 4-5 episodes of loose watery stool daily.  She reports 2 episodes of vomiting, most recently yesterday morning.  She has tolerated foods and liquids since then and states she feels hungry.  There is been no blood in the vomit or stool.  She denies any dark color to the stool.  Denies abdominal pain.  Denies any shortness of breath or chest pain.  Reports a fever at home, up to" 103".  She has been taking DayQuil and NyQuil for her symptoms with some relief.  Most recent DayQuil was this morning prior to arrival.  Denies headache.  Denies rash.  Denies painful urination, frequency or urgency.  Reports both her children at home are recently Covid positive at a different facility, test yesterday and the day before.     Past Medical History:  Diagnosis Date  . Anemia   . Arthritis   . Bipolar 1 disorder ([place])   . Blood transfusion without reported diagnosis 2007   post bleeding childbirth  . Cancer ([place])    eval  lt breast cancer  . COVID-19 virus infection 06/07/2019  . Diabetes mellitus without complication ([place])    gestational  . Hand injury, right, initial encounter 03/19/2019  . Heart murmur    as a child-not adult-no cardiac work up  . History of radiation therapy 08/31/14-10/21/14   left breast/ left supraclavicular 50.4 Gy 28 fx, lef tposterior axillary boost 9.52 Gy 28 fx, left rbeast boost/ 10 Gy 5 fx  . Personal history of chemotherapy   . Personal history of radiation therapy     Patient Active Problem List   Diagnosis Date Noted  . Oligouria 03/10/2020  . History of breast  cancer 02/11/2020  . Acute cholecystitis 04/17/2019  . Total body pain 01/13/2019  . Blood in stool 01/13/2019  . Postmenopausal bleeding 10/25/2017  . Abscess of axilla, right 05/29/2017  . Chronic pain of both knees 05/03/2017  . Upper airway cough syndrome 03/27/2017  . Discogenic low back pain 03/20/2017  . COPD (chronic obstructive pulmonary disease) ([place]) 03/20/2017  . Tachycardia   . Dyspnea on exertion   . Grieving 11/21/2016  . Mixed incontinence 09/29/2015  . Genetic testing 08/26/2015  . Malignant neoplasm of upper-outer quadrant of left breast in female, estrogen receptor positive ([place]) 05/07/2014  . Disorder of bladder 09/29/2008  . LOW BACK PAIN, CHRONIC 09/29/2008  . Morbid (severe) obesity due to excess calories ([place]) 11/19/2007  . ANEMIA, IRON DEFICIENCY, CHRONIC 11/14/2007  . Mixed bipolar I disorder ([place]) 02/20/2007  . Tobacco use disorder 02/20/2007    Past Surgical History:  Procedure Laterality Date  . BREAST BIOPSY Left 04/01/14  . BREAST BIOPSY Right 04/19/14  . BREAST LUMPECTOMY WITH AXILLARY LYMPH NODE DISSECTION Bilateral 05/03/14  . CHOLECYSTECTOMY N/A 04/18/2019   Procedure: LAPAROSCOPIC CHOLECYSTECTOMY WITH INTRAOPERATIVE CHOLANGIOGRAM;  Surgeon: Jovita Kussmaul, MD;  Location: [facility];  Service: General;  Laterality: N/A;  . DILATION AND CURETTAGE OF UTERUS     after childbirth  . MULTIPLE  TOOTH EXTRACTIONS     only 5 left  . PORTACATH PLACEMENT Right 06/18/2014   Procedure: INSERTION PORT-A-CATH;  Surgeon: [name], MD;  Location: [facility];  Service: General;  Laterality: Right;  . TONSILLECTOMY AND ADENOIDECTOMY      OB History   No obstetric history on file.      Home Medications    Prior to Admission medications   Medication Sig Start Date End Date Taking? Authorizing Provider  acetaminophen (TYLENOL) 325 MG tablet Take 2 tablets (650 mg total) by mouth every 6 (six) hours as needed. 07/15/20   Katelinn Justice, Marguerita Beards, [name]    albuterol (PROVENTIL HFA;VENTOLIN HFA) 108 (90 Base) MCG/ACT inhaler Inhale 2 puffs into the lungs every 6 (six) hours as needed for wheezing or shortness of breath. Patient not taking: Reported on 04/17/2019 02/25/17   [name], DO  benzonatate (TESSALON) 100 MG capsule Take 1 capsule (100 mg total) by mouth every 8 (eight) hours. 07/15/20   Yoshi Mancillas, Marguerita Beards, [name]  busPIRone (BUSPAR) 15 MG tablet Take 15 mg by mouth at bedtime. 10/27/18   [provider]  clonazePAM (KLONOPIN) 1 MG tablet Take 1 tablet (1 mg total) by mouth 2 (two) times daily as needed for anxiety. 02/11/20   Myles Gip, DO  doxepin (SINEQUAN) 50 MG capsule Take 50 mg by mouth at bedtime.  12/18/18   [provider]  FLUoxetine (PROZAC) 40 MG capsule Take 80 mg by mouth daily.  10/27/18   [provider]  fluticasone (FLONASE) 50 MCG/ACT nasal spray Place 1 spray into both nostrils daily. 07/15/20   Kyal Arts, Marguerita Beards, [name]  ipratropium-albuterol (DUONEB) 0.5-2.5 (3) MG/3ML SOLN Take 3 mLs by nebulization every 4 (four) hours as needed. 06/09/19   Myles Gip, DO  lamoTRIgine (LAMICTAL) 200 MG tablet Take 1 tablet (200 mg total) by mouth 2 (two) times daily. 07/25/15   Janora Norlander, DO  loperamide (IMODIUM) 2 MG capsule Take 1 capsule (2 mg total) by mouth daily for 7 days. 07/15/20 07/22/20  Marianna Cid, Marguerita Beards, [name]  Lurasidone HCl 60 MG TABS Take 1 tablet (60 mg total) by mouth at bedtime. 02/11/20   Myles Gip, DO  ondansetron (ZOFRAN) 4 MG tablet Take 1 tablet (4 mg total) by mouth every 8 (eight) hours as needed for nausea or vomiting. 07/15/20   Deloy Archey, Marguerita Beards, [name]  pantoprazole (PROTONIX) 40 MG tablet TAKE 1 TABLET DAILY BY MOUTH 30 TO 60 MINUTES BEFORE FIRST MEAL OF THE DAY Patient not taking: No sig reported 03/27/17   Tanda Rockers, MD  polyethylene glycol (MIRALAX / GLYCOLAX) 17 g packet Take 17 g by mouth daily. Patient not taking: Reported on 04/17/2019 03/11/19   Doran Stabler, MD   prazosin (MINIPRESS) 2 MG capsule Take 2 mg by mouth at bedtime as needed (nightmares).  07/01/14   [provider]  QUEtiapine (SEROQUEL) 400 MG tablet at bedtime. 11/29/19   [provider]  risperiDONE (RISPERDAL) 3 MG tablet Take 1 tablet (3 mg total) by mouth at bedtime. Patient not taking: Reported on 04/02/2019 11/16/16   [name], DO    Family History Family History  Problem Relation Age of Onset  . Heart disease Father   . Cancer Father        Prostate  . Colon cancer Maternal Grandfather   . Colon cancer Paternal Grandmother   . Stomach cancer Neg Hx   . Esophageal cancer  Neg Hx     Social History Social History   Tobacco Use  . Smoking status: Former Smoker    Packs/day: 1.00    Years: 32.00    Pack years: 32.00    Types: Cigarettes    Quit date: 01/08/2019    Years since quitting: 1.5  . Smokeless tobacco: Never Used  Vaping Use  . Vaping Use: Never used  Substance Use Topics  . Alcohol use: No  . Drug use: No     Allergies   Patient has no known allergies.   Review of Systems Review of Systems   Physical Exam Triage Vital Signs ED Triage Vitals  Enc Vitals Group     BP 07/15/20 1406 110/80     Pulse Rate 07/15/20 1406 (!) 103     Resp 07/15/20 1406 18     Temp 07/15/20 1406 98.1 F (36.7 C)     Temp Source 07/15/20 1406 Oral     SpO2 07/15/20 1406 95 %     Weight 07/15/20 1407 293 lb (132.9 kg)     Height 07/15/20 1407 5\' 8"  (1.727 m)     Head Circumference --      Peak Flow --      Pain Score 07/15/20 1407 8     Pain Loc --      Pain Edu? --      Excl. in [place]? --    No data found.  Updated Vital Signs BP 110/80   Pulse (!) 103   Temp 98.1 F (36.7 C) (Oral)   Resp 18   Ht 5\' 8"  (1.727 m)   Wt 293 lb (132.9 kg)   SpO2 95%   BMI 44.55 kg/m   Visual Acuity Right Eye Distance:   Left Eye Distance:   Bilateral Distance:    Right Eye Near:   Left Eye Near:    Bilateral Near:     Physical Exam Vitals  and nursing note reviewed.  Constitutional:      General: She is not in acute distress.    Appearance: She is well-developed. She is ill-appearing.  HENT:     Head: Normocephalic and atraumatic.     Right Ear: Tympanic membrane normal.     Left Ear: Tympanic membrane normal.     Nose: Congestion present.     Mouth/Throat:     Mouth: Mucous membranes are moist.     Pharynx: No posterior oropharyngeal erythema.  Eyes:     Extraocular Movements: Extraocular movements intact.     Conjunctiva/sclera: Conjunctivae normal.  Cardiovascular:     Rate and Rhythm: Normal rate and regular rhythm.     Heart sounds: No murmur heard.      Comments: Heart rate between 95 and 102, regular rhythm. Pulmonary:     Effort: Pulmonary effort is normal. No respiratory distress.     Breath sounds: Normal breath sounds. No stridor. No wheezing, rhonchi or rales.     Comments: Speaking in full sentences.  Saturating 95 to 97% on room air. Abdominal:     Palpations: Abdomen is soft.     Tenderness: There is no abdominal tenderness. There is no right CVA tenderness or left CVA tenderness.  Musculoskeletal:     Cervical back: Neck supple.     Right lower leg: No edema.  Skin:    General: Skin is warm and dry.  Neurological:     General: No focal deficit present.     Mental Status: She is  alert and oriented to person, place, and time.      UC Treatments / Results  Labs (all labs ordered are listed, but only abnormal results are displayed) Labs Reviewed  SARS CORONAVIRUS 2 (TAT 6-24 HRS)    EKG   Radiology No results found.  Procedures Procedures (including critical care time)  Medications Ordered in UC Medications - No data to display  Initial Impression / Assessment and Plan / UC Course  I have reviewed the triage vital signs and the nursing notes.  Pertinent labs & imaging results that were available during my care of the patient were reviewed by me and considered in my medical  decision making (see chart for details).     #Viral illness #Exposure to Covid Patient is a 49 year old presenting with viral illness symptoms consistent with COVID-19 after close exposure to Covid.  She is afebrile here, mildly elevated heart rate.  Mucous membranes are moist and she appears to be well-hydrated here.  Otherwise reassuring exam.  Covid test sent.  We will treat her symptomatically, Zofran only as needed.  We will have her consider Imodium if diarrhea is not slowing.  Tessalon for cough.  Discussed return, follow-up and emergency department precautions.  Patient verbalized agreement and understanding plan of care Final Clinical Impressions(s) / UC Diagnoses   Final diagnoses:  Viral illness  Close exposure to COVID-19 virus     Discharge Instructions     Take the medications as prescribed -Only take the Zofran if you are unable to tolerate fluids, or feeling very nauseous -You may consider taking the Imodium once a day if bowels are not swelling -Take the Tessalon for cough -Take Tylenol for pain and body ache  If severely worsening symptoms with shortness of breath, high fevers, severe belly pain, blood in your stool or very dark color stools or other concerning symptoms go to the emergency department  Follow-up with your primary care as needed  If your Covid-19 test is positive, you will receive a phone call from [facility] regarding your results. Negative test results are not called. Both positive and negative results area always visible on MyChart. If you do not have a MyChart account, sign up instructions are in your discharge papers.   Persons who are directed to care for themselves at home may discontinue isolation under the following conditions:  . At least 10 days have passed since symptom onset and . At least 24 hours have passed without running a fever (this means without the use of fever-reducing medications) and . Other symptoms have  improved.  Persons infected with COVID-19 who never develop symptoms may discontinue isolation and other precautions 10 days after the date of their first positive COVID-19 test.       ED Prescriptions    Medication Sig Dispense Auth. Provider   benzonatate (TESSALON) 100 MG capsule Take 1 capsule (100 mg total) by mouth every 8 (eight) hours. 21 capsule Ura Hausen, Marguerita Beards, [name]   ondansetron (ZOFRAN) 4 MG tablet Take 1 tablet (4 mg total) by mouth every 8 (eight) hours as needed for nausea or vomiting. 8 tablet Luz Mares, Marguerita Beards, [name]   fluticasone (FLONASE) 50 MCG/ACT nasal spray Place 1 spray into both nostrils daily. 15.8 mL Maki Hege, Marguerita Beards, [name]   loperamide (IMODIUM) 2 MG capsule Take 1 capsule (2 mg total) by mouth daily for 7 days. 7 capsule Barett Whidbee, Marguerita Beards, [name]   acetaminophen (TYLENOL) 325 MG tablet Take 2 tablets (650 mg total) by mouth  every 6 (six) hours as needed. 30 tablet Aisha Greenberger, Marguerita Beards, [name]     PDMP not reviewed this encounter.   Purnell Shoemaker, [name] 07/15/20 1447

## 2020-07-16 LAB — SARS CORONAVIRUS 2 (TAT 6-24 HRS): SARS Coronavirus 2: POSITIVE — AB

## 2020-07-18 ENCOUNTER — Telehealth: Payer: Self-pay | Admitting: Internal Medicine

## 2020-07-18 NOTE — Telephone Encounter (Signed)
Called to Discuss with patient about Covid symptoms and the use of the monoclonal antibody infusion for those with mild to moderate Covid symptoms and at a high risk of hospitalization.     Pt is qualified for this infusion due to co-morbid conditions and/or a member of an at-risk group.     Patient Active Problem List   Diagnosis Date Noted  . Oligouria 03/10/2020  . History of breast cancer 02/11/2020  . Acute cholecystitis 04/17/2019  . Total body pain 01/13/2019  . Blood in stool 01/13/2019  . Postmenopausal bleeding 10/25/2017  . Abscess of axilla, right 05/29/2017  . Chronic pain of both knees 05/03/2017  . Upper airway cough syndrome 03/27/2017  . Discogenic low back pain 03/20/2017  . COPD (chronic obstructive pulmonary disease) ([place]) 03/20/2017  . Tachycardia   . Dyspnea on exertion   . Grieving 11/21/2016  . Mixed incontinence 09/29/2015  . Genetic testing 08/26/2015  . Malignant neoplasm of upper-outer quadrant of left breast in female, estrogen receptor positive ([place]) 05/07/2014  . Disorder of bladder 09/29/2008  . LOW BACK PAIN, CHRONIC 09/29/2008  . Morbid (severe) obesity due to excess calories ([place]) 11/19/2007  . ANEMIA, IRON DEFICIENCY, CHRONIC 11/14/2007  . Mixed bipolar I disorder ([place]) 02/20/2007  . Tobacco use disorder 02/20/2007    Patient declines infusion at this time. Symptoms tier reviewed as well as criteria for ending isolation. Preventative practices reviewed. Patient verbalized understanding.    Patient advised to call back if he/she decides that she does want to get infusion. Callback number to the infusion center given. Patient advised to go to Urgent care or ED with severe symptoms.   Last day eligible for infusion would be 10/1.   Alan Ripper, [place]

## 2020-08-01 ENCOUNTER — Telehealth: Payer: Self-pay | Admitting: Family Medicine

## 2020-08-01 NOTE — Telephone Encounter (Signed)
Called patient and let her know that patients need to wait at least 90 days before they can receive the covid vaccinations once they have been diagnosed.  Patient voiced understanding and will discuss with provider tomorrow to be certain.  Tara Wich,CMA

## 2020-08-01 NOTE — Telephone Encounter (Signed)
Patient is calling and would like to know how long after being covid positive her and her children would have to wait to receive their first covid vaccine. They tested positive 3 weeks ago with symptoms and are still testing positive without symptoms.   Pt is also coming to be seen in [place] clinic tomorrow for an ear infection.   The best call back number is [phone]

## 2020-08-02 ENCOUNTER — Other Ambulatory Visit: Payer: Self-pay

## 2020-08-02 ENCOUNTER — Telehealth: Payer: Self-pay

## 2020-08-02 ENCOUNTER — Ambulatory Visit (INDEPENDENT_AMBULATORY_CARE_PROVIDER_SITE_OTHER): Payer: Medicare Other | Admitting: Family Medicine

## 2020-08-02 ENCOUNTER — Telehealth: Payer: Self-pay | Admitting: Family Medicine

## 2020-08-02 VITALS — BP 130/64 | HR 101 | Temp 98.4°F

## 2020-08-02 DIAGNOSIS — H60392 Other infective otitis externa, left ear: Secondary | ICD-10-CM | POA: Diagnosis not present

## 2020-08-02 DIAGNOSIS — H60502 Unspecified acute noninfective otitis externa, left ear: Secondary | ICD-10-CM

## 2020-08-02 DIAGNOSIS — H6092 Unspecified otitis externa, left ear: Secondary | ICD-10-CM | POA: Insufficient documentation

## 2020-08-02 MED ORDER — NEOMYCIN-POLYMYXIN-HC 3.5-10000-1 OT SOLN: 4.0000 [drp] | Freq: Four times a day (QID) | OTIC | 0 refills | Status: DC

## 2020-08-02 MED ORDER — CIPROFLOXACIN-DEXAMETHASONE 0.3-0.1 % OT SUSP: 4.0000 [drp] | Freq: Two times a day (BID) | OTIC | 0 refills | Status: DC

## 2020-08-02 NOTE — Telephone Encounter (Signed)
**  After Hours/ Emergency Line Call**  Received a call to report that Kimberly [name] as patient had concern about the medication she was prescribed in clinic today.  Patient seen for ear pain by Dr. Higinio Plan. She was told at the pharmacy that the antibiotics she was prescribed would not be in until Thursday and would cost $47.  She is concerned that she will be in pain and would like amoxicillin instead.  Will forward to PCP and Dr. Higinio Plan.  Lyndee Hensen, DO PGY-2, [facility] 08/02/2020 7:04 PM

## 2020-08-02 NOTE — Telephone Encounter (Signed)
Patient calls nurse line stating Ciprodex is not affordable for her ~90 dollars. I looked up goodrx with no help. Please send in an alternative.

## 2020-08-02 NOTE — Progress Notes (Signed)
    SUBJECTIVE:   CHIEF COMPLAINT / HPI: Ear pain  Ms. Mcelmurry is a 49 year old female presenting discussed the following:  Ear pain: Left-sided for the past 5 days.  Initially when it started she had a fever to 101F for 2 days, however has not had this any further.  Reports her ear just feels really sore and swollen in the canal.  She has been having a lot of otorrhea, yellow to [place].  Hearing sounds a little bit muffled.  She denies any ear erythema/rash, facial swelling or weakness.  She has not had an ear infection since she was younger.  No known diabetes.  She has been using Tylenol/ibuprofen with little relief.   She recently was diagnosed with Covid on 9/20 and is continuing to get better.  She still has significant fatigue related to this and a lot of nasal congestion.  She denies feeling short of breath, frequent cough, chest pain, rash, dysuria, or abdominal pain.  PERTINENT  PMH / PSH: COPD, bipolar 1 disorder, tobacco use risk cancer, chronic pain  OBJECTIVE:   BP 130/64   Pulse (!) 101   Temp 98.4 F (36.9 C) (Oral)   SpO2 96%   General: Alert, NAD HEENT: NCAT, MMM, oropharynx non-erythematous, right external canal normal and TM clear with appropriate light reflex.  Left external canal patent but edematous with yellow thin otorrhea present, no granulation tissue or significant erythema present.  Left TM clear/gray with appropriate light reflex and visualized landmarks, no perforation seen.  Very tender with movement of L tragus and otoscope specula in the canal.  No soft tissue swelling or rash of ear or around mastoid process. No tenderness with palpation of mastoid process.  Cardiac: RRR Lungs: Clear bilaterally throughout a/p, no increased WOB, without wheezing or rhonchi   Abdomen: soft, non-tender Msk: Moves all extremities spontaneously  Ext: Warm, dry, 2+ distal pulses  ASSESSMENT/PLAN:   Otitis externa of left ear Moderate and atypical given reported fever  initially (particularly in this immunocompetent patient without diabetes), however clinical exam certainly suggestive of otitis externa. Would not expect fever from Covid this late in course and does not have additional s/sx of febrile etiology elsewhere.  Rx'd Ciprodex drops BID x 7 days and recommended follow-up next week to ensure resolution.  Set strict return precautions including if symptoms not improving the next 1-2 days following treatment or any ear/neck swelling, facial weakness, or skin erythema/persistent fever.  Could consider adding oral Augmentin if minimal change.    Update: Ciprodex too expensive, then sent in Corticosporin drops which were also too expensive and not ready to be picked up.  [place] and spoke with pharmacist on 10/13, who recommended sending in ophthalmic ciprofloxacin and dexamethasone, Rx'd ciprofloxacin 0.3% and dexamethasone 0.1% ophthalmic solutions to use in her left ear which are much cheaper.  Call patient to inform they were filling medications to be ready tonight 10/13.  Discussed precautions again to follow-up closely if not better in the next 1-2 days given severity of reported pain.   Patriciaann Clan, [place]

## 2020-08-02 NOTE — Patient Instructions (Addendum)
It was wonderful to see you today.  I am so sorry that you continue to feel not well after recently having Covid and now with ear infection.  I suspect you have an infection of your outer ear, I have sent in eardrops that you will use twice daily for the next 7 days.   I would expect the pain in your ear to get better after the next 1-2 days slowly after starting the eardrops.  If you continue to have persistent ear pain, changes in hearing, drainage, or persistent fever please follow-up sooner than scheduled.  I would like you to follow-up in 2 weeks so that we can take a look back in your ear to make sure everything is back to normal and see how you are doing from a post Covid standpoint.

## 2020-08-03 ENCOUNTER — Encounter: Payer: Self-pay | Admitting: Family Medicine

## 2020-08-03 MED ORDER — DEXAMETHASONE 0.1 % OP SUSP: 4.0000 [drp] | Freq: Two times a day (BID) | OPHTHALMIC | 0 refills | Status: AC

## 2020-08-03 MED ORDER — CIPROFLOXACIN HCL 0.3 % OP SOLN: 4.0000 [drp] | Freq: Two times a day (BID) | OPHTHALMIC | 0 refills | Status: AC

## 2020-08-03 NOTE — Assessment & Plan Note (Addendum)
Moderate and atypical given reported fever initially (particularly in this immunocompetent patient without diabetes), however clinical exam certainly suggestive of otitis externa. Would not expect fever from Covid this late in course and does not have additional s/sx of febrile etiology elsewhere.  Rx'd Ciprodex drops BID x 7 days and recommended follow-up next week to ensure resolution.  Set strict return precautions including if symptoms not improving the next 1-2 days following treatment or any ear/neck swelling, facial weakness, or skin erythema/persistent fever.  Could consider adding oral Augmentin if minimal change.

## 2020-08-08 DIAGNOSIS — Z20822 Contact with and (suspected) exposure to covid-19: Secondary | ICD-10-CM | POA: Diagnosis not present

## 2020-08-09 ENCOUNTER — Ambulatory Visit (INDEPENDENT_AMBULATORY_CARE_PROVIDER_SITE_OTHER): Payer: Medicare Other | Admitting: Family Medicine

## 2020-08-09 ENCOUNTER — Encounter: Payer: Self-pay | Admitting: Family Medicine

## 2020-08-09 ENCOUNTER — Other Ambulatory Visit: Payer: Self-pay

## 2020-08-09 DIAGNOSIS — H66012 Acute suppurative otitis media with spontaneous rupture of ear drum, left ear: Secondary | ICD-10-CM

## 2020-08-09 MED ORDER — AMOXICILLIN 500 MG PO CAPS: 500.0000 mg | ORAL_CAPSULE | Freq: Three times a day (TID) | ORAL | 0 refills | Status: DC

## 2020-08-09 MED ORDER — AMOXICILLIN-POT CLAVULANATE 875-125 MG PO TABS: 1.0000 | ORAL_TABLET | Freq: Two times a day (BID) | ORAL | 0 refills | Status: AC

## 2020-08-09 NOTE — Patient Instructions (Addendum)
Take Augmentin twice daily for seven days.   If you are not feeling better in 2 days, please call us and I will refer to you an ENT to be seen as soon as possible.

## 2020-08-10 DIAGNOSIS — H6690 Otitis media, unspecified, unspecified ear: Secondary | ICD-10-CM | POA: Insufficient documentation

## 2020-08-10 NOTE — Assessment & Plan Note (Signed)
Physical exam findings consistent with otitis media.  Also concern for acute rhinosinusitis of the left maxillary sinus given tenderness with light tapping and congestion.  Given addition of sinus findings, will treat with Augmentin, 875 mg twice daily x7 days.  If patient does not have any relief in 2 days with antibiotics, she should call the office for urgent referral to ENT.

## 2020-08-10 NOTE — Progress Notes (Signed)
   SUBJECTIVE:  CHIEF COMPLAINT / HPI:   Ear Pain  Patient returning to the office complaining of continued ear pain.  She was seen by Dr. Higinio Plan on 10/12 and diagnosed with otitis externa.  She was prescribed Ciprodex, but had multiple issues obtaining the medication due to cost.  Ultimately, she was sent ciprofloxacin 0.3% and dexamethasone 0.1% ophthalmic solutions after Dr. Higinio Plan discussed cost issues with the pharmacist.  Patient reports that when using the medication, she experienced more pain in her ear canal.  She did not have any relief.  She denies any continuation of fevers but does note that she feels congested.  She also has associated pain in her anterior draw, maxillary sinus and neck.  Patient does report draining of creamy opaque fluid from her left ear.  She reports that the pain is internal and patient reports some pain on the right side as well.  Patient denies sore throat.   PERTINENT  PMH / PSH: Tobacco use  OBJECTIVE:  BP 128/80   Pulse (!) 110   Wt (!) 301 lb 3.2 oz (136.6 kg)   SpO2 98%   BMI 45.80 kg/m   General: Ill-appearing but nontoxic HEENT: Moist mucous membranes.  Oropharynx nonerythematous, tonsils nonswollen nonerythematous.  Left pinna nontender to palpation.  Internal canal with swelling and erythema.  Effusion behind the tympanic membrane without any obvious draining at this time.  Decreased hearing to finger rub on left side.  Right external and internal ear exam within normal limits. Pain with light tapping of left maxillary sinus.  Swollen turbinates bilaterally on gross exam.  ASSESSMENT/PLAN:  Otitis media Physical exam findings consistent with otitis media.  Also concern for acute rhinosinusitis of the left maxillary sinus given tenderness with light tapping and congestion.  Given addition of sinus findings, will treat with Augmentin, 875 mg twice daily x7 days.  If patient does not have any relief in 2 days with antibiotics, she should call the  office for urgent referral to ENT.    Wilber Oliphant, MD [place]

## 2020-08-19 ENCOUNTER — Telehealth: Payer: Self-pay | Admitting: Family Medicine

## 2020-08-19 ENCOUNTER — Other Ambulatory Visit: Payer: Self-pay | Admitting: Family Medicine

## 2020-08-19 DIAGNOSIS — H6092 Unspecified otitis externa, left ear: Secondary | ICD-10-CM

## 2020-08-19 NOTE — Progress Notes (Signed)
Per documentation in last office visit note, if patient had no relief with treatment for otitis externa she should have urgent referral to ENT. Per nurse note patient called with no relief. Will place ENT referral per previous office note.

## 2020-08-19 NOTE — Telephone Encounter (Signed)
Reviewed previous office note, will place urgent ENT referral per recommendation of previous note as patient has had no resolution of her symptoms. Referral placed.

## 2020-08-19 NOTE — Telephone Encounter (Signed)
Will forward to Md. Maron Stanzione,CMA  

## 2020-08-19 NOTE — Telephone Encounter (Signed)
Patient is calling to let Dr. Vanessa Olin know that the medication that Dr. Maudie Mercury prescribed for her ears was helping at first but she is now having continued ear pain and issues. Patient would like to know if she could have a referral to ENT.

## 2020-08-23 ENCOUNTER — Observation Stay (HOSPITAL_COMMUNITY)
Admission: EM | Admit: 2020-08-23 | Discharge: 2020-08-25 | Disposition: A | Discharge status: Home / Self Care | Payer: Medicare Other | Attending: Emergency Medicine | Admitting: Emergency Medicine

## 2020-08-23 ENCOUNTER — Emergency Department (HOSPITAL_COMMUNITY): Payer: Medicare Other

## 2020-08-23 DIAGNOSIS — N63 Unspecified lump in unspecified breast: Secondary | ICD-10-CM | POA: Diagnosis not present

## 2020-08-23 DIAGNOSIS — Z87891 Personal history of nicotine dependence: Secondary | ICD-10-CM | POA: Diagnosis not present

## 2020-08-23 DIAGNOSIS — Z853 Personal history of malignant neoplasm of breast: Secondary | ICD-10-CM

## 2020-08-23 DIAGNOSIS — U071 COVID-19: Secondary | ICD-10-CM | POA: Diagnosis not present

## 2020-08-23 DIAGNOSIS — Z8616 Personal history of COVID-19: Secondary | ICD-10-CM | POA: Insufficient documentation

## 2020-08-23 DIAGNOSIS — J441 Chronic obstructive pulmonary disease with (acute) exacerbation: Principal | ICD-10-CM | POA: Diagnosis present

## 2020-08-23 DIAGNOSIS — R0602 Shortness of breath: Secondary | ICD-10-CM

## 2020-08-23 DIAGNOSIS — E119 Type 2 diabetes mellitus without complications: Secondary | ICD-10-CM | POA: Diagnosis not present

## 2020-08-23 DIAGNOSIS — J9811 Atelectasis: Secondary | ICD-10-CM | POA: Diagnosis not present

## 2020-08-23 DIAGNOSIS — I10 Essential (primary) hypertension: Secondary | ICD-10-CM | POA: Diagnosis not present

## 2020-08-23 DIAGNOSIS — R059 Cough, unspecified: Secondary | ICD-10-CM | POA: Diagnosis not present

## 2020-08-23 DIAGNOSIS — Z20822 Contact with and (suspected) exposure to covid-19: Secondary | ICD-10-CM | POA: Diagnosis not present

## 2020-08-23 DIAGNOSIS — F3160 Bipolar disorder, current episode mixed, unspecified: Secondary | ICD-10-CM | POA: Diagnosis present

## 2020-08-23 DIAGNOSIS — D509 Iron deficiency anemia, unspecified: Secondary | ICD-10-CM | POA: Diagnosis present

## 2020-08-23 DIAGNOSIS — C50919 Malignant neoplasm of unspecified site of unspecified female breast: Secondary | ICD-10-CM | POA: Diagnosis not present

## 2020-08-23 DIAGNOSIS — N6320 Unspecified lump in the left breast, unspecified quadrant: Secondary | ICD-10-CM | POA: Diagnosis not present

## 2020-08-23 MED ORDER — ALBUTEROL SULFATE HFA 108 (90 BASE) MCG/ACT IN AERS
4.0000 | INHALATION_SPRAY | Freq: Once | RESPIRATORY_TRACT | Status: AC
  Administered 2020-08-23: 4 via RESPIRATORY_TRACT
  Filled 2020-08-23: qty 6.7

## 2020-08-23 MED ORDER — IPRATROPIUM BROMIDE HFA 17 MCG/ACT IN AERS
2.0000 | INHALATION_SPRAY | Freq: Once | RESPIRATORY_TRACT | Status: AC
  Administered 2020-08-23: 2 via RESPIRATORY_TRACT
  Filled 2020-08-23: qty 12.9

## 2020-08-23 MED ORDER — PREDNISONE 20 MG PO TABS
60.0000 mg | ORAL_TABLET | Freq: Once | ORAL | Status: AC
  Administered 2020-08-23: 60 mg via ORAL
  Filled 2020-08-23: qty 3

## 2020-08-23 NOTE — ED Provider Notes (Signed)
[facility] DEPT Provider Note   CSN: [id number] Arrival date & time: 08/23/20  2216     History Chief Complaint  Patient presents with  . Shortness of Breath    [name] Dede is a 49 y.o. female with past medical history significant of anemia, breast cancer s/p chemo and radiation x 5 years ago. Covid positive on 07/12/20.  HPI Patient presents to emergency department today via EMS with chief complaint of gradually worsening shortness of breath since her Covid diagnosis x aproximately 1 month ago.  She is also admitting to nonproductive cough, nausea and diarrhea.  She states been having diarrhea daily.  Denies seeing any blood in her stool.  She has tried taking multiple over-the-counter medications including Tylenol and ibuprofen as well as variety of cough medicines. She is tolerating PO intake without difficulty. She has self reported history of asthma. She has a nebulizer machine at home which she has been using without symptom improvement. She denies congestion, hemoptysis, hematemesis, chest pain, abdominal pain, nausea, emesis, urinary symptoms, diarrhea, rash.  Chart review she has been admitted for COPD exacerbation in 2018.    Past Medical History:  Diagnosis Date  . Acute cholecystitis 04/17/2019  . Anemia   . Arthritis   . Bipolar 1 disorder ([place])   . Blood transfusion without reported diagnosis 2007   post bleeding childbirth  . Cancer ([place])    eval  lt breast cancer  . COVID-19 virus infection 06/07/2019  . Diabetes mellitus without complication ([place])    gestational  . Hand injury, right, initial encounter 03/19/2019  . Heart murmur    as a child-not adult-no cardiac work up  . History of radiation therapy 08/31/14-10/21/14   left breast/ left supraclavicular 50.4 Gy 28 fx, lef tposterior axillary boost 9.52 Gy 28 fx, left rbeast boost/ 10 Gy 5 fx  . Personal history of chemotherapy   . Personal history of radiation therapy      Patient Active Problem List   Diagnosis Date Noted  . Otitis media 08/10/2020  . Otitis externa of left ear 08/02/2020  . Oligouria 03/10/2020  . History of breast cancer 02/11/2020  . Total body pain 01/13/2019  . Blood in stool 01/13/2019  . Postmenopausal bleeding 10/25/2017  . Abscess of axilla, right 05/29/2017  . Chronic pain of both knees 05/03/2017  . Upper airway cough syndrome 03/27/2017  . Discogenic low back pain 03/20/2017  . COPD (chronic obstructive pulmonary disease) ([place]) 03/20/2017  . Tachycardia   . Dyspnea on exertion   . Grieving 11/21/2016  . Mixed incontinence 09/29/2015  . Genetic testing 08/26/2015  . Malignant neoplasm of upper-outer quadrant of left breast in female, estrogen receptor positive ([place]) 05/07/2014  . Disorder of bladder 09/29/2008  . LOW BACK PAIN, CHRONIC 09/29/2008  . Morbid (severe) obesity due to excess calories ([place]) 11/19/2007  . ANEMIA, IRON DEFICIENCY, CHRONIC 11/14/2007  . Mixed bipolar I disorder ([place]) 02/20/2007  . Tobacco use disorder 02/20/2007    Past Surgical History:  Procedure Laterality Date  . BREAST BIOPSY Left 04/01/14  . BREAST BIOPSY Right 04/19/14  . BREAST LUMPECTOMY WITH AXILLARY LYMPH NODE DISSECTION Bilateral 05/03/14  . CHOLECYSTECTOMY N/A 04/18/2019   Procedure: LAPAROSCOPIC CHOLECYSTECTOMY WITH INTRAOPERATIVE CHOLANGIOGRAM;  Surgeon: Jovita Kussmaul, MD;  Location: [facility];  Service: General;  Laterality: N/A;  . DILATION AND CURETTAGE OF UTERUS     after childbirth  . MULTIPLE TOOTH EXTRACTIONS     only 5 left  .  PORTACATH PLACEMENT Right 06/18/2014   Procedure: INSERTION PORT-A-CATH;  Surgeon: [name], MD;  Location: [facility];  Service: General;  Laterality: Right;  . TONSILLECTOMY AND ADENOIDECTOMY       OB History   No obstetric history on file.     Family History  Problem Relation Age of Onset  . Heart disease Father   . Cancer Father        Prostate  . Colon  cancer Maternal Grandfather   . Colon cancer Paternal Grandmother   . Stomach cancer Neg Hx   . Esophageal cancer Neg Hx     Social History   Tobacco Use  . Smoking status: Former Smoker    Packs/day: 1.00    Years: 32.00    Pack years: 32.00    Types: Cigarettes    Quit date: 01/08/2019    Years since quitting: 1.6  . Smokeless tobacco: Never Used  Vaping Use  . Vaping Use: Never used  Substance Use Topics  . Alcohol use: No  . Drug use: No    Home Medications Prior to Admission medications   Medication Sig Start Date End Date Taking? Authorizing Provider  acetaminophen (TYLENOL) 325 MG tablet Take 2 tablets (650 mg total) by mouth every 6 (six) hours as needed. 07/15/20   Darr, Edison Nasuti, [name]  albuterol (PROVENTIL HFA;VENTOLIN HFA) 108 (90 Base) MCG/ACT inhaler Inhale 2 puffs into the lungs every 6 (six) hours as needed for wheezing or shortness of breath. Patient not taking: Reported on 04/17/2019 02/25/17   [name], DO  benzonatate (TESSALON) 100 MG capsule Take 1 capsule (100 mg total) by mouth every 8 (eight) hours. 07/15/20   Darr, Edison Nasuti, [name]  busPIRone (BUSPAR) 15 MG tablet Take 15 mg by mouth at bedtime. 10/27/18   [provider]  clonazePAM (KLONOPIN) 1 MG tablet Take 1 tablet (1 mg total) by mouth 2 (two) times daily as needed for anxiety. 02/11/20   Myles Gip, DO  doxepin (SINEQUAN) 50 MG capsule Take 50 mg by mouth at bedtime.  12/18/18   [provider]  FLUoxetine (PROZAC) 40 MG capsule Take 80 mg by mouth daily.  10/27/18   [provider]  fluticasone (FLONASE) 50 MCG/ACT nasal spray Place 1 spray into both nostrils daily. 07/15/20   Darr, Edison Nasuti, [name]  ipratropium-albuterol (DUONEB) 0.5-2.5 (3) MG/3ML SOLN Take 3 mLs by nebulization every 4 (four) hours as needed. 06/09/19   Myles Gip, DO  lamoTRIgine (LAMICTAL) 200 MG tablet Take 1 tablet (200 mg total) by mouth 2 (two) times daily. 07/25/15   Janora Norlander, DO   Lurasidone HCl 60 MG TABS Take 1 tablet (60 mg total) by mouth at bedtime. 02/11/20   Myles Gip, DO  ondansetron (ZOFRAN) 4 MG tablet Take 1 tablet (4 mg total) by mouth every 8 (eight) hours as needed for nausea or vomiting. 07/15/20   Darr, Edison Nasuti, [name]  pantoprazole (PROTONIX) 40 MG tablet TAKE 1 TABLET DAILY BY MOUTH 30 TO 60 MINUTES BEFORE FIRST MEAL OF THE DAY Patient not taking: No sig reported 03/27/17   Tanda Rockers, MD  polyethylene glycol (MIRALAX / GLYCOLAX) 17 g packet Take 17 g by mouth daily. Patient not taking: Reported on 04/17/2019 03/11/19   Doran Stabler, MD  prazosin (MINIPRESS) 2 MG capsule Take 2 mg by mouth at bedtime as needed (nightmares).  07/01/14   [provider]  QUEtiapine (SEROQUEL) 400 MG tablet at bedtime.  11/29/19   [provider]  risperiDONE (RISPERDAL) 3 MG tablet Take 1 tablet (3 mg total) by mouth at bedtime. Patient not taking: Reported on 04/02/2019 11/16/16   [name], DO    Allergies    Patient has no known allergies.  Review of Systems   Review of Systems All other systems are reviewed and are negative for acute change except as noted in the HPI.  Physical Exam Updated Vital Signs BP 122/67 (BP Location: Left Arm)   Pulse (!) 112   Temp 98.1 F (36.7 C) (Oral)   Resp 20   Ht 5\' 8"  (1.727 m)   Wt (!) 136.6 kg   SpO2 100%   BMI 45.79 kg/m   Physical Exam Vitals and nursing note reviewed.  Constitutional:      General: She is not in acute distress.    Appearance: She is not ill-appearing.     Comments: Dry hacking cough during entirety of exam  HENT:     Head: Normocephalic and atraumatic.     Right Ear: Tympanic membrane and external ear normal.     Left Ear: Tympanic membrane and external ear normal.     Nose: Nose normal.     Mouth/Throat:     Mouth: Mucous membranes are moist.     Pharynx: Oropharynx is clear.  Eyes:     General: No scleral icterus.       Right eye: No discharge.         Left eye: No discharge.     Extraocular Movements: Extraocular movements intact.     Conjunctiva/sclera: Conjunctivae normal.     Pupils: Pupils are equal, round, and reactive to light.  Neck:     Vascular: No JVD.  Cardiovascular:     Rate and Rhythm: Regular rhythm. Tachycardia present.     Pulses: Normal pulses.          Radial pulses are 2+ on the right side and 2+ on the left side.     Heart sounds: Normal heart sounds.  Pulmonary:     Comments: Oxygen saturation 100% during exam. Symmetric chest rise. Expiratory wheeze heard in all lung fields. Talking in full sentences Abdominal:     Comments: Abdomen is soft, non-distended, and non-tender in all quadrants. No rigidity, no guarding. No peritoneal signs.  Musculoskeletal:        General: Normal range of motion.     Cervical back: Normal range of motion.  Skin:    General: Skin is warm and dry.     Capillary Refill: Capillary refill takes less than 2 seconds.  Neurological:     Mental Status: She is oriented to person, place, and time.     GCS: GCS eye subscore is 4. GCS verbal subscore is 5. GCS motor subscore is 6.     Comments: Fluent speech, no facial droop.  Psychiatric:        Behavior: Behavior normal.     ED Results / Procedures / Treatments   Labs (all labs ordered are listed, but only abnormal results are displayed) Labs Reviewed  CBC WITH DIFFERENTIAL/PLATELET - Abnormal; Notable for the following components:      Result Value   Hemoglobin 10.5 (*)    HCT 34.4 (*)    MCV 76.6 (*)    MCH 23.4 (*)    RDW 19.9 (*)    All other components within normal limits  RESP PANEL BY RT PCR (RSV, FLU A&B, COVID)  BASIC  METABOLIC PANEL    EKG EKG Interpretation  Date/Time:  Wednesday August 24 2020 00:07:57 EDT Ventricular Rate:  95 PR Interval:    QRS Duration: 104 QT Interval:  414 QTC Calculation: 521 R Axis:   76 Text Interpretation: Sinus rhythm Borderline T abnormalities, anterior leads Prolonged QT  interval Confirmed by Quintella Reichert [phone]) on 08/24/2020 12:19:03 AM   Radiology DG Chest 1 View  Result Date: 08/23/2020 CLINICAL DATA:  Shortness of breath, COVID-19 2 months ago EXAM: CHEST  1 VIEW COMPARISON:  04/17/2019 FINDINGS: Single frontal view of the chest demonstrates stable right chest wall port. Cardiac silhouette is unremarkable. No airspace disease, effusion, or pneumothorax. No acute bony abnormalities. IMPRESSION: 1. No acute intrathoracic process. Electronically Signed   By: Randa Ngo M.D.   On: 08/23/2020 22:56    Procedures Procedures (including critical care time)  Medications Ordered in ED Medications  magnesium sulfate IVPB 2 g 50 mL (has no administration in time range)  albuterol (PROVENTIL,VENTOLIN) solution continuous neb (has no administration in time range)  predniSONE (DELTASONE) tablet 60 mg (60 mg Oral Given 08/23/20 2335)  albuterol (VENTOLIN HFA) 108 (90 Base) MCG/ACT inhaler 4-6 puff (4 puffs Inhalation Given 08/23/20 2334)  ipratropium (ATROVENT HFA) inhaler 2 puff (2 puffs Inhalation Given 08/23/20 2335)    ED Course  I have reviewed the triage vital signs and the nursing notes.  Pertinent labs & imaging results that were available during my care of the patient were reviewed by me and considered in my medical decision making (see chart for details).    MDM Rules/Calculators/A&P                          History provided by patient with additional history obtained from chart review.    49 yo female presenting with shortness of breath, covid positive 9/24. On ED arrival she is well appearing, in no acute distress. She is tachycardic to 112. She has hacking cough during entire exam without hypoxia. Lung exam with expiratory wheeze. No accessory muscle use. No abdominal tenderness. Does not look to be dehydrated. Patient given prednisone, albuterol and ipratropium inhaler. I viewed pt's chest xray and it does not suggest acute infectious  processes. EKG without ischemic changes. She does have prolonged QT which has been seen on previous EKGs.  After interventions tachycardia has resolved. Lung sounds not improved.   CBC without leukocytosis, she does have hemoglobin 10.5 known history of anemia.  BMP overall unremarkable.  Will give patient continuous neb and IV mag. Patient care transferred to M. McDonald [name] at the end of my shift pending reassessment. Patient presentation, ED course, and plan of care discussed with review of all pertinent labs and imaging. Please see his/her note for further details regarding further ED course and disposition.    Portions of this note were generated with [employer]. Dictation errors may occur despite best attempts at proofreading.    Final Clinical Impression(s) / ED Diagnoses Final diagnoses:  Shortness of breath    Rx / DC Orders ED Discharge Orders    None       Lewanda Rife 08/24/20 0034    Lacretia Leigh, MD 08/24/20 [phone]

## 2020-08-23 NOTE — ED Triage Notes (Signed)
Patient complains of shortness of breath/coughing up "meat"/diarrhea/fever since being diagnosed with COVID in august. Patient with history of cancer, bronchitis/asthma; expiratory wheezing can be heard although patient is 100% on room air. Patient is AxOx4.

## 2020-08-24 ENCOUNTER — Emergency Department (HOSPITAL_COMMUNITY): Payer: Medicare Other

## 2020-08-24 ENCOUNTER — Encounter (HOSPITAL_COMMUNITY): Payer: Self-pay

## 2020-08-24 ENCOUNTER — Other Ambulatory Visit: Payer: Self-pay

## 2020-08-24 DIAGNOSIS — J441 Chronic obstructive pulmonary disease with (acute) exacerbation: Principal | ICD-10-CM

## 2020-08-24 DIAGNOSIS — J9811 Atelectasis: Secondary | ICD-10-CM | POA: Diagnosis not present

## 2020-08-24 DIAGNOSIS — F3160 Bipolar disorder, current episode mixed, unspecified: Secondary | ICD-10-CM | POA: Diagnosis not present

## 2020-08-24 DIAGNOSIS — D509 Iron deficiency anemia, unspecified: Secondary | ICD-10-CM

## 2020-08-24 DIAGNOSIS — N63 Unspecified lump in unspecified breast: Secondary | ICD-10-CM | POA: Diagnosis not present

## 2020-08-24 DIAGNOSIS — C50919 Malignant neoplasm of unspecified site of unspecified female breast: Secondary | ICD-10-CM | POA: Diagnosis not present

## 2020-08-24 DIAGNOSIS — N6320 Unspecified lump in the left breast, unspecified quadrant: Secondary | ICD-10-CM | POA: Diagnosis not present

## 2020-08-24 LAB — BASIC METABOLIC PANEL
Anion gap: 9 (ref 5–15)
Anion gap: 9 (ref 5–15)
BUN: 6 mg/dL (ref 6–20)
BUN: 7 mg/dL (ref 6–20)
CO2: 24 mmol/L (ref 22–32)
CO2: 24 mmol/L (ref 22–32)
Calcium: 9.3 mg/dL (ref 8.9–10.3)
Calcium: 9.4 mg/dL (ref 8.9–10.3)
Chloride: 101 mmol/L (ref 98–111)
Chloride: 103 mmol/L (ref 98–111)
Creatinine, Ser: 0.86 mg/dL (ref 0.44–1.00)
Creatinine, Ser: 0.94 mg/dL (ref 0.44–1.00)
GFR, Estimated: >60 mL/min (ref >60)
GFR, Estimated: >60 mL/min (ref >60)
Glucose, Bld: 139 mg/dL — ABNORMAL HIGH (ref 70–99)
Glucose, Bld: 95 mg/dL (ref 70–99)
Potassium: 3.4 mmol/L — ABNORMAL LOW (ref 3.5–5.1)
Potassium: 3.5 mmol/L (ref 3.5–5.1)
Sodium: 134 mmol/L — ABNORMAL LOW (ref 135–145)
Sodium: 136 mmol/L (ref 135–145)

## 2020-08-24 LAB — CBC WITH DIFFERENTIAL/PLATELET
Abs Immature Granulocytes: 0.06 10*3/uL (ref 0.00–0.07)
Basophils Absolute: 0.1 10*3/uL (ref 0.0–0.1)
Basophils Relative: 1 %
Eosinophils Absolute: 0 10*3/uL (ref 0.0–0.5)
Eosinophils Relative: 0 %
HCT: 34.4 % — ABNORMAL LOW (ref 36.0–46.0)
Hemoglobin: 10.5 g/dL — ABNORMAL LOW (ref 12.0–15.0)
Immature Granulocytes: 1 %
Lymphocytes Relative: 44 %
Lymphs Abs: 3.4 10*3/uL (ref 0.7–4.0)
MCH: 23.4 pg — ABNORMAL LOW (ref 26.0–34.0)
MCHC: 30.5 g/dL (ref 30.0–36.0)
MCV: 76.6 fL — ABNORMAL LOW (ref 80.0–100.0)
Monocytes Absolute: 0.6 10*3/uL (ref 0.1–1.0)
Monocytes Relative: 8 %
Neutro Abs: 3.7 10*3/uL (ref 1.7–7.7)
Neutrophils Relative %: 46 %
Platelets: 272 10*3/uL (ref 150–400)
RBC: 4.49 MIL/uL (ref 3.87–5.11)
RDW: 19.9 % — ABNORMAL HIGH (ref 11.5–15.5)
WBC: 7.8 10*3/uL (ref 4.0–10.5)
nRBC: 0 % (ref 0.0–0.2)

## 2020-08-24 LAB — CBC
HCT: 33.9 % — ABNORMAL LOW (ref 36.0–46.0)
Hemoglobin: 10.3 g/dL — ABNORMAL LOW (ref 12.0–15.0)
MCH: 23.3 pg — ABNORMAL LOW (ref 26.0–34.0)
MCHC: 30.4 g/dL (ref 30.0–36.0)
MCV: 76.5 fL — ABNORMAL LOW (ref 80.0–100.0)
Platelets: 269 10*3/uL (ref 150–400)
RBC: 4.43 MIL/uL (ref 3.87–5.11)
RDW: 19.9 % — ABNORMAL HIGH (ref 11.5–15.5)
WBC: 6.7 10*3/uL (ref 4.0–10.5)
nRBC: 0 % (ref 0.0–0.2)

## 2020-08-24 LAB — EXPECTORATED SPUTUM ASSESSMENT W GRAM STAIN, RFLX TO RESP C: Special Requests: NORMAL

## 2020-08-24 LAB — RETICULOCYTES
Immature Retic Fract: 29.7 % — ABNORMAL HIGH (ref 2.3–15.9)
RBC.: 4.38 MIL/uL (ref 3.87–5.11)
Retic Count, Absolute: 61.8 10*3/uL (ref 19.0–186.0)
Retic Ct Pct: 1.4 % (ref 0.4–3.1)

## 2020-08-24 LAB — FERRITIN: Ferritin: 4 ng/mL — ABNORMAL LOW (ref 11–307)

## 2020-08-24 LAB — BLOOD GAS, ARTERIAL
Acid-Base Excess: 0.8 mmol/L (ref 0.0–2.0)
Bicarbonate: 25 mmol/L (ref 20.0–28.0)
O2 Saturation: 91.7 %
Patient temperature: 97.4
pCO2 arterial: 39.6 mmHg (ref 32.0–48.0)
pH, Arterial: 7.413 (ref 7.350–7.450)
pO2, Arterial: 62.6 mmHg — ABNORMAL LOW (ref 83.0–108.0)

## 2020-08-24 LAB — RESP PANEL BY RT PCR (RSV, FLU A&B, COVID)
Influenza A by PCR: NEGATIVE
Influenza B by PCR: NEGATIVE
Respiratory Syncytial Virus by PCR: NEGATIVE
SARS Coronavirus 2 by RT PCR: NEGATIVE

## 2020-08-24 LAB — MAGNESIUM: Magnesium: 3.1 mg/dL — ABNORMAL HIGH (ref 1.7–2.4)

## 2020-08-24 LAB — FOLATE: Folate: 8.9 ng/mL (ref >5.9)

## 2020-08-24 LAB — HIV ANTIBODY (ROUTINE TESTING W REFLEX): HIV Screen 4th Generation wRfx: NONREACTIVE

## 2020-08-24 LAB — VITAMIN B12: Vitamin B-12: 181 pg/mL (ref 180–914)

## 2020-08-24 LAB — IRON AND TIBC
Iron: 20 ug/dL — ABNORMAL LOW (ref 28–170)
Saturation Ratios: 4 % — ABNORMAL LOW (ref 10.4–31.8)
TIBC: 518 ug/dL — ABNORMAL HIGH (ref 250–450)
UIBC: 498 ug/dL

## 2020-08-24 LAB — BRAIN NATRIURETIC PEPTIDE: B Natriuretic Peptide: 24.7 pg/mL (ref 0.0–100.0)

## 2020-08-24 MED ORDER — SODIUM CHLORIDE 0.9% FLUSH
3.0000 mL | Freq: Two times a day (BID) | INTRAVENOUS | Status: DC
  Administered 2020-08-24 (×2): 3 mL via INTRAVENOUS

## 2020-08-24 MED ORDER — ALBUTEROL (5 MG/ML) CONTINUOUS INHALATION SOLN
10.0000 mg/h | INHALATION_SOLUTION | RESPIRATORY_TRACT | Status: DC
  Administered 2020-08-24: 10 mg/h via RESPIRATORY_TRACT
  Filled 2020-08-24: qty 20

## 2020-08-24 MED ORDER — SODIUM CHLORIDE 0.9% FLUSH: 3.0000 mL | INTRAVENOUS | Status: DC | PRN

## 2020-08-24 MED ORDER — ONDANSETRON HCL 4 MG PO TABS: 4.0000 mg | ORAL_TABLET | Freq: Four times a day (QID) | ORAL | Status: DC | PRN

## 2020-08-24 MED ORDER — SODIUM CHLORIDE 0.9 % IV SOLN: 250.0000 mL | INTRAVENOUS | Status: DC | PRN

## 2020-08-24 MED ORDER — METHYLPREDNISOLONE SODIUM SUCC 125 MG IJ SOLR
60.0000 mg | Freq: Once | INTRAMUSCULAR | Status: AC
  Administered 2020-08-24: 60 mg via INTRAVENOUS
  Filled 2020-08-24: qty 2

## 2020-08-24 MED ORDER — ONDANSETRON HCL 4 MG/2ML IJ SOLN: 4.0000 mg | Freq: Four times a day (QID) | INTRAMUSCULAR | Status: DC | PRN

## 2020-08-24 MED ORDER — IOHEXOL 350 MG/ML SOLN
100.0000 mL | Freq: Once | INTRAVENOUS | Status: AC | PRN
  Administered 2020-08-24: 100 mL via INTRAVENOUS

## 2020-08-24 MED ORDER — SODIUM CHLORIDE (PF) 0.9 % IJ SOLN
INTRAMUSCULAR | Status: AC
  Administered 2020-08-24: 3 mL via INTRAVENOUS
  Filled 2020-08-24: qty 50

## 2020-08-24 MED ORDER — POTASSIUM CHLORIDE CRYS ER 20 MEQ PO TBCR
40.0000 meq | EXTENDED_RELEASE_TABLET | Freq: Once | ORAL | Status: AC
  Administered 2020-08-24: 40 meq via ORAL
  Filled 2020-08-24: qty 2

## 2020-08-24 MED ORDER — MOMETASONE FURO-FORMOTEROL FUM 200-5 MCG/ACT IN AERO
1.0000 | INHALATION_SPRAY | Freq: Two times a day (BID) | RESPIRATORY_TRACT | Status: DC
  Administered 2020-08-24 – 2020-08-25 (×2): 1 via RESPIRATORY_TRACT
  Filled 2020-08-24: qty 8.8

## 2020-08-24 MED ORDER — MAGNESIUM SULFATE 2 GM/50ML IV SOLN
2.0000 g | Freq: Once | INTRAVENOUS | Status: AC
  Administered 2020-08-24: 2 g via INTRAVENOUS
  Filled 2020-08-24: qty 50

## 2020-08-24 MED ORDER — SODIUM CHLORIDE 0.9 % IV SOLN
500.0000 mg | INTRAVENOUS | Status: DC
  Administered 2020-08-24 – 2020-08-25 (×2): 500 mg via INTRAVENOUS
  Filled 2020-08-24 (×2): qty 500

## 2020-08-24 MED ORDER — POLYETHYLENE GLYCOL 3350 17 G PO PACK: 17.0000 g | PACK | Freq: Every day | ORAL | Status: DC | PRN

## 2020-08-24 MED ORDER — QUETIAPINE FUMARATE 200 MG PO TABS
400.0000 mg | ORAL_TABLET | Freq: Every day | ORAL | Status: DC
  Administered 2020-08-24: 400 mg via ORAL
  Filled 2020-08-24: qty 2

## 2020-08-24 MED ORDER — LEVOFLOXACIN IN D5W 500 MG/100ML IV SOLN: 500.0000 mg | Freq: Once | INTRAVENOUS | Status: DC

## 2020-08-24 MED ORDER — ACETAMINOPHEN 325 MG PO TABS: 650.0000 mg | ORAL_TABLET | Freq: Four times a day (QID) | ORAL | Status: DC | PRN

## 2020-08-24 MED ORDER — GUAIFENESIN-CODEINE 100-10 MG/5ML PO SOLN
10.0000 mL | Freq: Once | ORAL | Status: AC
  Administered 2020-08-24: 10 mL via ORAL
  Filled 2020-08-24: qty 10

## 2020-08-24 MED ORDER — BUSPIRONE HCL 5 MG PO TABS
15.0000 mg | ORAL_TABLET | Freq: Every day | ORAL | Status: DC
  Administered 2020-08-24: 15 mg via ORAL
  Filled 2020-08-24: qty 3

## 2020-08-24 MED ORDER — ALBUTEROL SULFATE (2.5 MG/3ML) 0.083% IN NEBU
5.0000 mg | INHALATION_SOLUTION | Freq: Once | RESPIRATORY_TRACT | Status: AC
  Administered 2020-08-24: 5 mg via RESPIRATORY_TRACT

## 2020-08-24 MED ORDER — CHLORHEXIDINE GLUCONATE CLOTH 2 % EX PADS
6.0000 | MEDICATED_PAD | Freq: Every day | CUTANEOUS | Status: DC
  Administered 2020-08-24: 6 via TOPICAL

## 2020-08-24 MED ORDER — IPRATROPIUM-ALBUTEROL 0.5-2.5 (3) MG/3ML IN SOLN: 3.0000 mL | RESPIRATORY_TRACT | Status: DC | PRN

## 2020-08-24 MED ORDER — METHYLPREDNISOLONE SODIUM SUCC 40 MG IJ SOLR
40.0000 mg | Freq: Two times a day (BID) | INTRAMUSCULAR | Status: DC
  Administered 2020-08-24 – 2020-08-25 (×2): 40 mg via INTRAVENOUS
  Filled 2020-08-24 (×2): qty 1

## 2020-08-24 MED ORDER — PANTOPRAZOLE SODIUM 40 MG PO TBEC: 40.0000 mg | DELAYED_RELEASE_TABLET | Freq: Every day | ORAL | Status: DC

## 2020-08-24 MED ORDER — IPRATROPIUM BROMIDE 0.02 % IN SOLN
0.5000 mg | Freq: Once | RESPIRATORY_TRACT | Status: AC
  Administered 2020-08-24: 0.5 mg via RESPIRATORY_TRACT

## 2020-08-24 MED ORDER — ACETAMINOPHEN 650 MG RE SUPP: 650.0000 mg | Freq: Four times a day (QID) | RECTAL | Status: DC | PRN

## 2020-08-24 MED ORDER — ENOXAPARIN SODIUM 40 MG/0.4ML ~~LOC~~ SOLN
40.0000 mg | SUBCUTANEOUS | Status: DC
  Administered 2020-08-24 – 2020-08-25 (×2): 40 mg via SUBCUTANEOUS
  Filled 2020-08-24 (×2): qty 0.4

## 2020-08-24 NOTE — ED Provider Notes (Signed)
49 year old female received at sign out from [facility] pending reevaluation after continuous nebulizer treatment.  Per her HPI:  "Kimberly [name] is a 49 y.o. female with past medical history significant of anemia, breast cancer s/p chemo and radiation x 5 years ago. Covid positive on 07/12/20.  HPI Patient presents to emergency department today via EMS with chief complaint of gradually worsening shortness of breath since her Covid diagnosis x aproximately 1 month ago.  She is also admitting to nonproductive cough, nausea and diarrhea.  She states been having diarrhea daily.  Denies seeing any blood in her stool.  She has tried taking multiple over-the-counter medications including Tylenol and ibuprofen as well as variety of cough medicines. She is tolerating PO intake without difficulty.   She has self reported history of asthma. She has a nebulizer machine at home which she has been using without symptom improvement."   On my evaluation, she has been having intermittent fevers, 100-101 for the last 3 weeks. She tested positive for Covid on September 24.    She does report that her symptoms feel similar to a COPD exacerbation that she was hospitalized for several years ago.   The patient reports that she is 2 months shy away from her 5-year mark of being in remission from breast cancer.  She does report that she has never had her Port-A-Cath removed.  States that she took her home Seroquel earlier tonight.   Physical Exam  BP (!) 139/95   Pulse 96   Temp 98.1 F (36.7 C) (Oral)   Resp (!) 23   Ht 5\' 8"  (1.727 m)   Wt (!) 136.6 kg   SpO2 96%   BMI 45.79 kg/m   Physical Exam Vitals and nursing note reviewed.  Constitutional:      General: She is not in acute distress. HENT:     Head: Normocephalic.  Eyes:     Extraocular Movements: Extraocular movements intact.     Conjunctiva/sclera: Conjunctivae normal.     Pupils: Pupils are equal, round, and reactive to light.   Cardiovascular:     Rate and Rhythm: Normal rate and regular rhythm.     Heart sounds: No murmur heard.  No friction rub. No gallop.   Pulmonary:     Effort: Pulmonary effort is normal. No respiratory distress.     Breath sounds: Wheezing and rhonchi present.     Comments: Rhonchorous breath sounds heard in all fields with scattered wheezes.  She is tachypneic, but there are no retractions or accessory muscle use.  She is speaking in short phrases and takes of breath every few words. No tenderness to palpation over the right Port-A-Cath. Chest:     Chest wall: No tenderness.  Abdominal:     General: There is no distension.     Palpations: Abdomen is soft. There is no mass.     Tenderness: There is no abdominal tenderness. There is no right CVA tenderness, left CVA tenderness, guarding or rebound.     Hernia: No hernia is present.  Musculoskeletal:     Cervical back: Neck supple.     Right lower leg: No edema.     Left lower leg: No edema.  Skin:    General: Skin is warm.     Findings: No rash.  Neurological:     Mental Status: She is alert.     Comments: Drowsy.  Speech is slightly slurred.  She is alert and oriented x4.  She is following simple commands.  Gait is not ataxic.  Cranial nerves II through XII are grossly intact.  Finger-to-nose is intact bilaterally.  Psychiatric:        Behavior: Behavior normal.     ED Course/Procedures     .Critical Care Performed by: Joanne Gavel, [name] Authorized by: Joanne Gavel, [name]   Critical care provider statement:    Critical care time (minutes):  40   Critical care time was exclusive of:  Separately billable procedures and treating other patients and teaching time   Critical care was necessary to treat or prevent imminent or life-threatening deterioration of the following conditions:  Respiratory failure   Critical care was time spent personally by me on the following activities:  Ordering and performing treatments and  interventions, ordering and review of laboratory studies, ordering and review of radiographic studies, pulse oximetry, re-evaluation of patient's condition, review of old charts, obtaining history from patient or surrogate, examination of patient, evaluation of patient's response to treatment, discussions with consultants and development of treatment plan with patient or surrogate   I assumed direction of critical care for this patient from another provider in my specialty: yes      MDM  48 year old female received a signout from [facility] pending reevaluation after continuous nebulizer.  Please see her note for further work-up and medical decision making.  The patient was discussed with Dr. Florina Ou, attending physician.  Patient with rhonchorous breath sounds and scattered wheezes in all fields with tachypnea.  Oxygen saturation was at 88% with good waveform on the monitor when I enter the room.  This was consistent for several minutes as I placed the patient on nasal cannula.  She is currently on 2 L nasal cannula.  She was given a continuous nebulizer, magnesium, and 60 mg of prednisone prior to my evaluation.  I will also add on 60 mg of Solu-Medrol and order cough suppressant.  On my initial evaluation of the patient, she was drowsy with slurred speech.  RN reports that the patient took her home Seroquel earlier tonight.  Spoke with her friend at bedside who reports that she is much drowsier than usual after taking Seroquel.  There is no concern for IV drug use or alcohol.  However, with changing mentation in the setting of concern for COPD exacerbation, ABG was obtained.  ABG without pH or CO2 changes.  Bicarb on metabolic panel was also not elevated.  She does not appear to be retaining CO2.  Patient is endorsing fever intermittently for the last 3 weeks.  This is also been documented in her medical record as she has seen her PCP on October 12 and October 19 and has been endorsing fevers.   Blood cultures x2 ordered.  However, because I'm concerned she is having a COPD exacerbation, I will order a dose of Levaquin given 3 weeks of reported fevers.  I did consider her fever could be secondary to Port-A-Cath that has been in place on her right chest wall for more than 5 years.  However, she is nontender to palpation over the Port-A-Cath.  And there are no evidence of soft tissue infection.  She was diagnosed with COVID-19 on September 24.  With her worsening shortness of breath, Dr. Florina Ou was in agreement that she should be assessed for PE.  No PE seen on CT.  She continues to have a new oxygen requirement.  Given acute hypoxia with COPD exacerbation, she will require admission.  Consult to the hospitalist team and Dr. Myna Hidalgo  will accept the patient for admission.  The patient appears reasonably stabilized for admission considering the current resources, flow, and capabilities available in the ED at this time, and I doubt any other [facility] requiring further screening and/or treatment in the ED prior to admission.       Joanne Gavel, [name] 08/24/20 0439    Shanon Rosser, MD 08/24/20 [phone]

## 2020-08-24 NOTE — H&P (Signed)
History and Physical    Kimberly [name] ENI:[id number] DOB: 1970-11-04 DOA: 08/23/2020  PCP: Lurline Del, DO   Patient coming from: Home   Chief Complaint: SOB, cough   HPI: Kimberly [name] is a 49 y.o. female with medical history significant for bipolar disorder, breast cancer status post chemotherapy and radiation, COVID-19 infection in September 2021, and treatment last month for AOM, now presenting to the emergency department for evaluation of shortness of breath and cough.  Patient reports shortness of breath and cough for close to a month now, quit smoking a couple weeks ago due to this illness, but has continued to worsen, particularly over the last few days.  She has been using duo nebs at home without any appreciable relief.  ED Course: Upon arrival to the ED, patient is found to be afebrile, saturating mid 90s on room air, tachypneic, tachycardic, and with stable blood pressure.  EKG features a sinus rhythm and chest x-rays negative for acute cardiopulmonary disease.  CTA chest is negative for PE or other acute finding.  Chemistry panel is unremarkable and CBC notable for microcytic anemia with hemoglobin 10.5, down from 12.4 last year.  BNP is normal.  COVID-19 PCR is negative.  Blood cultures were collected in the ED and the patient was treated with systemic steroids and multiple nebulized breathing treatments, reports some improvement with this, but remains dyspneic at rest.  Review of Systems:  All other systems reviewed and apart from HPI, are negative.  Past Medical History:  Diagnosis Date  . Acute cholecystitis 04/17/2019  . Anemia   . Arthritis   . Bipolar 1 disorder ([place])   . Blood transfusion without reported diagnosis 2007   post bleeding childbirth  . Cancer ([place])    eval  lt breast cancer  . COVID-19 virus infection 06/07/2019  . Diabetes mellitus without complication ([place])    gestational  . Hand injury, right, initial encounter 03/19/2019  . Heart murmur     as a child-not adult-no cardiac work up  . History of radiation therapy 08/31/14-10/21/14   left breast/ left supraclavicular 50.4 Gy 28 fx, lef tposterior axillary boost 9.52 Gy 28 fx, left rbeast boost/ 10 Gy 5 fx  . Personal history of chemotherapy   . Personal history of radiation therapy     Past Surgical History:  Procedure Laterality Date  . BREAST BIOPSY Left 04/01/14  . BREAST BIOPSY Right 04/19/14  . BREAST LUMPECTOMY WITH AXILLARY LYMPH NODE DISSECTION Bilateral 05/03/14  . CHOLECYSTECTOMY N/A 04/18/2019   Procedure: LAPAROSCOPIC CHOLECYSTECTOMY WITH INTRAOPERATIVE CHOLANGIOGRAM;  Surgeon: Jovita Kussmaul, MD;  Location: [facility];  Service: General;  Laterality: N/A;  . DILATION AND CURETTAGE OF UTERUS     after childbirth  . MULTIPLE TOOTH EXTRACTIONS     only 5 left  . PORTACATH PLACEMENT Right 06/18/2014   Procedure: INSERTION PORT-A-CATH;  Surgeon: [name], MD;  Location: [facility];  Service: General;  Laterality: Right;  . TONSILLECTOMY AND ADENOIDECTOMY      Social History:   reports that she quit smoking about 19 months ago. Her smoking use included cigarettes. She has a 32.00 pack-year smoking history. She has never used smokeless tobacco. She reports that she does not drink alcohol and does not use drugs.  No Known Allergies  Family History  Problem Relation Age of Onset  . Heart disease Father   . Cancer Father        Prostate  . Colon cancer Maternal Grandfather   .  Colon cancer Paternal Grandmother   . Stomach cancer Neg Hx   . Esophageal cancer Neg Hx      Prior to Admission medications   Medication Sig Start Date End Date Taking? Authorizing Provider  acetaminophen (TYLENOL) 325 MG tablet Take 2 tablets (650 mg total) by mouth every 6 (six) hours as needed. Patient taking differently: Take 650 mg by mouth every 6 (six) hours as needed for mild pain.  07/15/20  Yes Darr, Edison Nasuti, [name]  busPIRone (BUSPAR) 15 MG tablet Take 15 mg by  mouth at bedtime. 10/27/18  Yes [provider]  QUEtiapine (SEROQUEL) 400 MG tablet Take 400 mg by mouth at bedtime.  11/29/19  Yes [provider]  albuterol (PROVENTIL HFA;VENTOLIN HFA) 108 (90 Base) MCG/ACT inhaler Inhale 2 puffs into the lungs every 6 (six) hours as needed for wheezing or shortness of breath. Patient not taking: Reported on 04/17/2019 02/25/17   [name], DO  benzonatate (TESSALON) 100 MG capsule Take 1 capsule (100 mg total) by mouth every 8 (eight) hours. Patient not taking: Reported on 08/24/2020 07/15/20   Darr, Edison Nasuti, [name]  clonazePAM (KLONOPIN) 1 MG tablet Take 1 tablet (1 mg total) by mouth 2 (two) times daily as needed for anxiety. Patient not taking: Reported on 08/24/2020 02/11/20   Myles Gip, DO  fluticasone [facility]) 50 MCG/ACT nasal spray Place 1 spray into both nostrils daily. Patient not taking: Reported on 08/24/2020 07/15/20   Darr, Edison Nasuti, [name]  ipratropium-albuterol (DUONEB) 0.5-2.5 (3) MG/3ML SOLN Take 3 mLs by nebulization every 4 (four) hours as needed. Patient not taking: Reported on 08/24/2020 06/09/19   Myles Gip, DO  lamoTRIgine (LAMICTAL) 200 MG tablet Take 1 tablet (200 mg total) by mouth 2 (two) times daily. Patient not taking: Reported on 08/24/2020 07/25/15   Janora Norlander, DO  Lurasidone HCl 60 MG TABS Take 1 tablet (60 mg total) by mouth at bedtime. Patient not taking: Reported on 08/24/2020 02/11/20   Myles Gip, DO  ondansetron (ZOFRAN) 4 MG tablet Take 1 tablet (4 mg total) by mouth every 8 (eight) hours as needed for nausea or vomiting. Patient not taking: Reported on 08/24/2020 07/15/20   Darr, Edison Nasuti, [name]  pantoprazole (PROTONIX) 40 MG tablet TAKE 1 TABLET DAILY BY MOUTH 30 TO 60 MINUTES BEFORE FIRST MEAL OF THE DAY Patient not taking: No sig reported 03/27/17   Tanda Rockers, MD  polyethylene glycol (MIRALAX / GLYCOLAX) 17 g packet Take 17 g by mouth daily. Patient not taking: Reported on 04/17/2019  03/11/19   Doran Stabler, MD  risperiDONE (RISPERDAL) 3 MG tablet Take 1 tablet (3 mg total) by mouth at bedtime. Patient not taking: Reported on 04/02/2019 11/16/16   [name], DO    Physical Exam: Vitals:   08/24/20 0042 08/24/20 0100 08/24/20 0200 08/24/20 0300  BP:  135/90 (!) 129/96 (!) 139/95  Pulse:  92 (!) 112 96  Resp:  (!) 25 (!) 31 (!) 23  Temp:      TempSrc:      SpO2: 97% 100% 98% 96%  Weight:      Height:         Constitutional: NAD, calm  Eyes: PERTLA, lids and conjunctivae normal ENMT: Mucous membranes are moist. Posterior pharynx clear of any exudate or lesions.   Neck: normal, supple, no masses, no thyromegaly Respiratory: clear to auscultation bilaterally, no wheezing, no crackles. No accessory muscle use.  Cardiovascular: S1 & S2 heard,  regular rate and rhythm. No extremity edema. No significant JVD. Abdomen: No distension, no tenderness, soft. Bowel sounds active.  Musculoskeletal: no clubbing / cyanosis. No joint deformity upper and lower extremities.   Skin: no significant rashes, lesions, ulcers. Warm, dry, well-perfused. Neurologic: CN 2-12 grossly intact. Sensation intact, DTR normal. Strength 5/5 in all 4 limbs.  Psychiatric: Alert and oriented to person, place, and situation. Pleasant and cooperative.    Labs and Imaging on Admission: I have personally reviewed following labs and imaging studies  CBC: Recent Labs  Lab 08/23/20 2345  WBC 7.8  NEUTROABS 3.7  HGB 10.5*  HCT 34.4*  MCV 76.6*  PLT 517   Basic Metabolic Panel: Recent Labs  Lab 08/23/20 2345 08/24/20 0223  NA 136  --   K 3.5  --   CL 103  --   CO2 24  --   GLUCOSE 95  --   BUN 6  --   CREATININE 0.86  --   CALCIUM 9.3  --   MG  --  3.1*   GFR: Estimated Creatinine Clearance: 117.5 mL/min (by C-G formula based on SCr of 0.86 mg/dL). Liver Function Tests: No results for input(s): AST, ALT, ALKPHOS, BILITOT, PROT, ALBUMIN in the last 168 hours. No results for  input(s): LIPASE, AMYLASE in the last 168 hours. No results for input(s): AMMONIA in the last 168 hours. Coagulation Profile: No results for input(s): INR, PROTIME in the last 168 hours. Cardiac Enzymes: No results for input(s): CKTOTAL, CKMB, CKMBINDEX, TROPONINI in the last 168 hours. BNP (last 3 results) No results for input(s): PROBNP in the last 8760 hours. HbA1C: No results for input(s): HGBA1C in the last 72 hours. CBG: No results for input(s): GLUCAP in the last 168 hours. Lipid Profile: No results for input(s): CHOL, HDL, LDLCALC, TRIG, CHOLHDL, LDLDIRECT in the last 72 hours. Thyroid Function Tests: No results for input(s): TSH, T4TOTAL, FREET4, T3FREE, THYROIDAB in the last 72 hours. Anemia Panel: No results for input(s): VITAMINB12, FOLATE, FERRITIN, TIBC, IRON, RETICCTPCT in the last 72 hours. Urine analysis:    Component Value Date/Time   COLORURINE YELLOW 03/07/2017 2111   APPEARANCEUR CLEAR 03/07/2017 2111   LABSPEC 1.016 03/07/2017 2111   PHURINE 7.0 03/07/2017 2111   GLUCOSEU NEGATIVE 03/07/2017 2111   HGBUR NEGATIVE 03/07/2017 2111   BILIRUBINUR negative 03/10/2020 1515   KETONESUR negative 03/10/2020 1515   [place] 03/07/2017 2111   PROTEINUR =30 (A) 03/10/2020 1515   PROTEINUR NEGATIVE 03/07/2017 2111   UROBILINOGEN 0.2 03/10/2020 1515   UROBILINOGEN 0.2 07/10/2014 1951   NITRITE Negative 03/10/2020 1515   NITRITE NEGATIVE 03/07/2017 2111   LEUKOCYTESUR Negative 03/10/2020 1515   Sepsis Labs: @LABRCNTIP (procalcitonin:4,lacticidven:4) ) Recent Results (from the past 240 hour(s))  Resp Panel by RT PCR (RSV, Flu A&B, Covid) - Nasopharyngeal Swab     Status: None   Collection Time: 08/24/20 12:33 AM   Specimen: Nasopharyngeal Swab  Result Value Ref Range Status   SARS Coronavirus 2 by RT PCR NEGATIVE NEGATIVE Final    Comment: (NOTE) SARS-CoV-2 target nucleic acids are NOT DETECTED.  The SARS-CoV-2 RNA is generally detectable in upper  respiratoy specimens during the acute phase of infection. The lowest concentration of SARS-CoV-2 viral copies this assay can detect is 131 copies/mL. A negative result does not preclude SARS-Cov-2 infection and should not be used as the sole basis for treatment or other patient management decisions. A negative result may occur with  improper specimen collection/handling, submission of specimen  other than nasopharyngeal swab, presence of viral mutation(s) within the areas targeted by this assay, and inadequate number of viral copies (<131 copies/mL). A negative result must be combined with clinical observations, patient history, and epidemiological information. The expected result is Negative.  Fact Sheet for Patients:  PinkCheek.be  Fact Sheet for Healthcare Providers:  GravelBags.it  This test is no t yet approved or cleared by the [place] FDA and  has been authorized for detection and/or diagnosis of SARS-CoV-2 by FDA under an Emergency Use Authorization (EUA). This EUA will remain  in effect (meaning this test can be used) for the duration of the COVID-19 declaration under Section 564(b)(1) of the Act, 21 U.S.C. section 360bbb-3(b)(1), unless the authorization is terminated or revoked sooner.     Influenza A by PCR NEGATIVE NEGATIVE Final   Influenza B by PCR NEGATIVE NEGATIVE Final    Comment: (NOTE) The Xpert Xpress SARS-CoV-2/FLU/RSV assay is intended as an aid in  the diagnosis of influenza from Nasopharyngeal swab specimens and  should not be used as a sole basis for treatment. Nasal washings and  aspirates are unacceptable for Xpert Xpress SARS-CoV-2/FLU/RSV  testing.  Fact Sheet for Patients: PinkCheek.be  Fact Sheet for Healthcare Providers: GravelBags.it  This test is not yet approved or cleared by the [place] FDA and  has been  authorized for detection and/or diagnosis of SARS-CoV-2 by  FDA under an Emergency Use Authorization (EUA). This EUA will remain  in effect (meaning this test can be used) for the duration of the  Covid-19 declaration under Section 564(b)(1) of the Act, 21  U.S.C. section 360bbb-3(b)(1), unless the authorization is  terminated or revoked.    Respiratory Syncytial Virus by PCR NEGATIVE NEGATIVE Final    Comment: (NOTE) Fact Sheet for Patients: PinkCheek.be  Fact Sheet for Healthcare Providers: GravelBags.it  This test is not yet approved or cleared by the [place] FDA and  has been authorized for detection and/or diagnosis of SARS-CoV-2 by  FDA under an Emergency Use Authorization (EUA). This EUA will remain  in effect (meaning this test can be used) for the duration of the  COVID-19 declaration under Section 564(b)(1) of the Act, 21 U.S.C.  section 360bbb-3(b)(1), unless the authorization is terminated or  revoked. Performed at [facility], [place] [address] [place], [place] [address]      Radiological Exams on Admission: DG Chest 1 View  Result Date: 08/23/2020 CLINICAL DATA:  Shortness of breath, COVID-19 2 months ago EXAM: CHEST  1 VIEW COMPARISON:  04/17/2019 FINDINGS: Single frontal view of the chest demonstrates stable right chest wall port. Cardiac silhouette is unremarkable. No airspace disease, effusion, or pneumothorax. No acute bony abnormalities. IMPRESSION: 1. No acute intrathoracic process. Electronically Signed   By: Randa Ngo M.D.   On: 08/23/2020 22:56   CT Angio Chest PE W and/or Wo Contrast  Result Date: 08/24/2020 CLINICAL DATA:  Cough, dyspnea, remote history breast cancer EXAM: CT ANGIOGRAPHY CHEST WITH CONTRAST TECHNIQUE: Multidetector CT imaging of the chest was performed using the standard protocol during bolus administration of intravenous contrast. Multiplanar CT image  reconstructions and MIPs were obtained to evaluate the vascular anatomy. CONTRAST:  136mL OMNIPAQUE IOHEXOL 350 MG/ML SOLN COMPARISON:  03/06/2017 FINDINGS: Cardiovascular: The examination is slightly limited by motion artifact. There is adequate opacification of the pulmonary arterial tree through the level of the proximal segmental arteries. More distally, motion artifact limits evaluation. There is no intraluminal filling defect identified to suggest acute  pulmonary embolism. The central pulmonary arteries are of normal caliber. Global cardiac size is within normal limits. No pericardial effusion. Mild atherosclerotic calcification noted within the aortic arch. Thoracic aorta is of normal caliber. Mediastinum/Nodes: No enlarged mediastinal, hilar, or axillary lymph nodes. Thyroid gland, trachea, and esophagus demonstrate no significant findings. Lungs/Pleura: Minimal left basilar atelectasis. Lungs are otherwise clear. No pneumothorax or pleural effusion. Central airways are widely patent. Upper Abdomen: Cholecystectomy has been performed. No acute abnormality. Musculoskeletal: No acute bone abnormality. No lytic or blastic bone lesion. Surgical clips related to left breast lumpectomy and sentinel lymph node biopsy are identified. Review of the MIP images confirms the above findings. IMPRESSION: No pulmonary embolism. No acute abnormality. No radiographic explanation for the patient's reported symptoms. Aortic Atherosclerosis (ICD10-I70.0). Electronically Signed   By: Fidela Salisbury MD   On: 08/24/2020 03:55    EKG: Independently reviewed. Sinus rhythm, QTc 453 ms calculated manually.   Assessment/Plan   1. COPD exacerbation  - Patient reports hx of COPD, had COVID in September, and now presents with progressive SOB, cough, and wheeze  - She is afebrile, no PE or other acute findings on CTA chest  - She reports some improvement with steroids and breathing treatments in ED but still dyspneic at rest  -  Culture sputum, continue systemic steroids, start azithromycin, continue breathing treatments   2. Microcytic anemia  - Hgb 10.5 on admission with MCV 76.6; both indices were normal last year  - She denies melena or hematochezia, states she used to take iron supplements years ago  - Check anemia panel   3. Bipolar disorder  - Continue Seroquel    DVT prophylaxis: Lovenox  Code Status: Full  Family Communication: Discussed with patient  Disposition Plan:  Patient is from: Home  Anticipated d/c is to: Home  Anticipated d/c date is: 08/25/20 Patient currently: Dyspneic at rest  Consults called: None Admission status: Observation     Vianne Bulls, MD [facility]  08/24/2020, 5:02 AM

## 2020-08-24 NOTE — Progress Notes (Signed)
PROGRESS NOTE    Kimberly [name]  DJT:[id number] DOB: [birth year] DOA: 08/23/2020 PCP: Lurline Del, DO    Brief Narrative:  49 y.o. female with medical history significant for bipolar disorder, breast cancer status post chemotherapy and radiation, COVID-19 infection in September 2021, and treatment last month for AOM, now presenting to the emergency department for evaluation of shortness of breath and cough.  Patient reports shortness of breath and cough for close to a month now, quit smoking a couple weeks ago due to this illness, but has continued to worsen, particularly over the last few days.  She has been using duo nebs at home without any appreciable relief.  Assessment & Plan:   Principal Problem:   COPD exacerbation ([place]) Active Problems:   ANEMIA, IRON DEFICIENCY, CHRONIC   Mixed bipolar I disorder ([place])   History of breast cancer  1. COPD exacerbation  - Patient reports hx of COPD, had COVID in September, and now presents with progressive SOB, cough, and wheeze  - She is afebrile, no PE or other acute findings on CTA chest  - She reports some improvement with steroids and breathing treatments in ED but still dyspneic at rest  - Culture sputum, continue systemic steroids, start azithromycin -Reports improvement in breathing tx although pt is still wheezing and visibly short of breath, wheezing heard even without stethoscope  2. Microcytic anemia  - Hgb 10.5 on admission with MCV 76.6; both indices were normal last year  - She denies melena or hematochezia, states she used to take iron supplements years ago  - Iron noted to be 20 -Recommend iron supplementation   3. Bipolar disorder  - Continue Seroquel as tolerated   DVT prophylaxis: Lovenox subq Code Status: Full Family Communication: Pt in room, family at bedside  Status is: Observation  The patient remains OBS appropriate and will d/c before 2 midnights.  Dispo: The patient is from: Home               Anticipated d/c is to: Home              Anticipated d/c date is: 2 days              Patient currently is not medically stable to d/c.       Consultants:     Procedures:     Antimicrobials: Anti-infectives (From admission, onward)   Start     Dose/Rate Route Frequency Ordered Stop   08/24/20 0500  azithromycin (ZITHROMAX) 500 mg in sodium chloride 0.9 % 250 mL IVPB        500 mg 250 mL/hr over 60 Minutes Intravenous Every 24 hours 08/24/20 0453     08/24/20 0430  levofloxacin (LEVAQUIN) IVPB 500 mg  Status:  Discontinued        500 mg 100 mL/hr over 60 Minutes Intravenous  Once 08/24/20 0418 08/24/20 0444       Subjective: Still wheezing and sob  Objective: Vitals:   08/24/20 0815 08/24/20 0848 08/24/20 1315 08/24/20 1716  BP:  (!) 143/83 (!) 135/91 (!) 142/84  Pulse:  (!) 102 100 (!) 110  Resp:  19 18   Temp: 97.6 F (36.4 C) 97.7 F (36.5 C) 98.1 F (36.7 C) 98.3 F (36.8 C)  TempSrc: Oral Oral Oral Oral  SpO2:  100% 97% 97%  Weight:      Height:        Intake/Output Summary (Last 24 hours) at 08/24/2020 1816 Last data filed at 08/24/2020 1600  Gross per 24 hour  Intake 700 ml  Output 700 ml  Net 0 ml   Filed Weights   08/23/20 2245  Weight: (!) 136.6 kg    Examination:  General exam: Appears calm, visibly short of breath Respiratory system: coarse sounds, wheezing, decrease BS Cardiovascular system: S1 & S2 heard, Regular Gastrointestinal system: Abdomen is nondistended, soft and nontender. No organomegaly or masses felt. Normal bowel sounds heard. Central nervous system: Alert and oriented. No focal neurological deficits. Extremities: Symmetric 5 x 5 power. Skin: No rashes, lesions Psychiatry: Judgement and insight appear normal. Mood & affect appropriate.   Data Reviewed: I have personally reviewed following labs and imaging studies  CBC: Recent Labs  Lab 08/23/20 2345 08/24/20 0550  WBC 7.8 6.7  NEUTROABS 3.7  --   HGB 10.5* 10.3*    HCT 34.4* 33.9*  MCV 76.6* 76.5*  PLT 272 096   Basic Metabolic Panel: Recent Labs  Lab 08/23/20 2345 08/24/20 0223 08/24/20 0550  NA 136  --  134*  K 3.5  --  3.4*  CL 103  --  101  CO2 24  --  24  GLUCOSE 95  --  139*  BUN 6  --  7  CREATININE 0.86  --  0.94  CALCIUM 9.3  --  9.4  MG  --  3.1*  --    GFR: Estimated Creatinine Clearance: 107.5 mL/min (by C-G formula based on SCr of 0.94 mg/dL). Liver Function Tests: No results for input(s): AST, ALT, ALKPHOS, BILITOT, PROT, ALBUMIN in the last 168 hours. No results for input(s): LIPASE, AMYLASE in the last 168 hours. No results for input(s): AMMONIA in the last 168 hours. Coagulation Profile: No results for input(s): INR, PROTIME in the last 168 hours. Cardiac Enzymes: No results for input(s): CKTOTAL, CKMB, CKMBINDEX, TROPONINI in the last 168 hours. BNP (last 3 results) No results for input(s): PROBNP in the last 8760 hours. HbA1C: No results for input(s): HGBA1C in the last 72 hours. CBG: No results for input(s): GLUCAP in the last 168 hours. Lipid Profile: No results for input(s): CHOL, HDL, LDLCALC, TRIG, CHOLHDL, LDLDIRECT in the last 72 hours. Thyroid Function Tests: No results for input(s): TSH, T4TOTAL, FREET4, T3FREE, THYROIDAB in the last 72 hours. Anemia Panel: Recent Labs    08/24/20 0550 08/24/20 0855  VITAMINB12  --  181  FOLATE  --  8.9  FERRITIN  --  4*  TIBC  --  518*  IRON  --  20*  RETICCTPCT 1.4  --    Sepsis Labs: No results for input(s): PROCALCITON, LATICACIDVEN in the last 168 hours.  Recent Results (from the past 240 hour(s))  Resp Panel by RT PCR (RSV, Flu A&B, Covid) - Nasopharyngeal Swab     Status: None   Collection Time: 08/24/20 12:33 AM   Specimen: Nasopharyngeal Swab  Result Value Ref Range Status   SARS Coronavirus 2 by RT PCR NEGATIVE NEGATIVE Final    Comment: (NOTE) SARS-CoV-2 target nucleic acids are NOT DETECTED.  The SARS-CoV-2 RNA is generally detectable in  upper respiratoy specimens during the acute phase of infection. The lowest concentration of SARS-CoV-2 viral copies this assay can detect is 131 copies/mL. A negative result does not preclude SARS-Cov-2 infection and should not be used as the sole basis for treatment or other patient management decisions. A negative result may occur with  improper specimen collection/handling, submission of specimen other than nasopharyngeal swab, presence of viral mutation(s) within the areas targeted by  this assay, and inadequate number of viral copies (<131 copies/mL). A negative result must be combined with clinical observations, patient history, and epidemiological information. The expected result is Negative.  Fact Sheet for Patients:  PinkCheek.be  Fact Sheet for Healthcare Providers:  GravelBags.it  This test is no t yet approved or cleared by the [place] FDA and  has been authorized for detection and/or diagnosis of SARS-CoV-2 by FDA under an Emergency Use Authorization (EUA). This EUA will remain  in effect (meaning this test can be used) for the duration of the COVID-19 declaration under Section 564(b)(1) of the Act, 21 U.S.C. section 360bbb-3(b)(1), unless the authorization is terminated or revoked sooner.     Influenza A by PCR NEGATIVE NEGATIVE Final   Influenza B by PCR NEGATIVE NEGATIVE Final    Comment: (NOTE) The Xpert Xpress SARS-CoV-2/FLU/RSV assay is intended as an aid in  the diagnosis of influenza from Nasopharyngeal swab specimens and  should not be used as a sole basis for treatment. Nasal washings and  aspirates are unacceptable for Xpert Xpress SARS-CoV-2/FLU/RSV  testing.  Fact Sheet for Patients: PinkCheek.be  Fact Sheet for Healthcare Providers: GravelBags.it  This test is not yet approved or cleared by the [place] FDA and  has been  authorized for detection and/or diagnosis of SARS-CoV-2 by  FDA under an Emergency Use Authorization (EUA). This EUA will remain  in effect (meaning this test can be used) for the duration of the  Covid-19 declaration under Section 564(b)(1) of the Act, 21  U.S.C. section 360bbb-3(b)(1), unless the authorization is  terminated or revoked.    Respiratory Syncytial Virus by PCR NEGATIVE NEGATIVE Final    Comment: (NOTE) Fact Sheet for Patients: PinkCheek.be  Fact Sheet for Healthcare Providers: GravelBags.it  This test is not yet approved or cleared by the [place] FDA and  has been authorized for detection and/or diagnosis of SARS-CoV-2 by  FDA under an Emergency Use Authorization (EUA). This EUA will remain  in effect (meaning this test can be used) for the duration of the  COVID-19 declaration under Section 564(b)(1) of the Act, 21 U.S.C.  section 360bbb-3(b)(1), unless the authorization is terminated or  revoked. Performed at [facility], [place] [address], [place], [place] [address]   Culture, sputum-assessment     Status: None   Collection Time: 08/24/20  8:53 AM   Specimen: Expectorated Sputum  Result Value Ref Range Status   Specimen Description EXPECTORATED SPUTUM  Final   Special Requests Normal  Final   Sputum evaluation   Final    Sputum specimen not acceptable for testing.  Please recollect.   INFORMED RN @0937  ON 11.3.2021 BY [facility] Performed at [facility], [place] [address] [place], [place] [address]    Report Status 08/24/2020 FINAL  Final     Radiology Studies: DG Chest 1 View  Result Date: 08/23/2020 CLINICAL DATA:  Shortness of breath, COVID-19 2 months ago EXAM: CHEST  1 VIEW COMPARISON:  04/17/2019 FINDINGS: Single frontal view of the chest demonstrates stable right chest wall port. Cardiac silhouette is unremarkable. No airspace disease, effusion, or  pneumothorax. No acute bony abnormalities. IMPRESSION: 1. No acute intrathoracic process. Electronically Signed   By: Randa Ngo M.D.   On: 08/23/2020 22:56   CT Angio Chest PE W and/or Wo Contrast  Result Date: 08/24/2020 CLINICAL DATA:  Cough, dyspnea, remote history breast cancer EXAM: CT ANGIOGRAPHY CHEST WITH CONTRAST TECHNIQUE: Multidetector CT imaging of the chest was  performed using the standard protocol during bolus administration of intravenous contrast. Multiplanar CT image reconstructions and MIPs were obtained to evaluate the vascular anatomy. CONTRAST:  114mL OMNIPAQUE IOHEXOL 350 MG/ML SOLN COMPARISON:  03/06/2017 FINDINGS: Cardiovascular: The examination is slightly limited by motion artifact. There is adequate opacification of the pulmonary arterial tree through the level of the proximal segmental arteries. More distally, motion artifact limits evaluation. There is no intraluminal filling defect identified to suggest acute pulmonary embolism. The central pulmonary arteries are of normal caliber. Global cardiac size is within normal limits. No pericardial effusion. Mild atherosclerotic calcification noted within the aortic arch. Thoracic aorta is of normal caliber. Mediastinum/Nodes: No enlarged mediastinal, hilar, or axillary lymph nodes. Thyroid gland, trachea, and esophagus demonstrate no significant findings. Lungs/Pleura: Minimal left basilar atelectasis. Lungs are otherwise clear. No pneumothorax or pleural effusion. Central airways are widely patent. Upper Abdomen: Cholecystectomy has been performed. No acute abnormality. Musculoskeletal: No acute bone abnormality. No lytic or blastic bone lesion. Surgical clips related to left breast lumpectomy and sentinel lymph node biopsy are identified. Review of the MIP images confirms the above findings. IMPRESSION: No pulmonary embolism. No acute abnormality. No radiographic explanation for the patient's reported symptoms. Aortic  Atherosclerosis (ICD10-I70.0). Electronically Signed   By: Fidela Salisbury MD   On: 08/24/2020 03:55    Scheduled Meds: . busPIRone  15 mg Oral QHS  . Chlorhexidine Gluconate Cloth  6 each Topical Daily  . enoxaparin (LOVENOX) injection  40 mg Subcutaneous Q24H  . mometasone-formoterol  1 puff Inhalation BID  . QUEtiapine  400 mg Oral QHS  . sodium chloride flush  3 mL Intravenous Q12H   Continuous Infusions: . sodium chloride    . azithromycin 500 mg (08/24/20 0552)     LOS: 0 days   Marylu Lund, MD [facility] Pager On Amion  If 7PM-7AM, please contact night-coverage 08/24/2020, 6:16 PM

## 2020-08-25 ENCOUNTER — Other Ambulatory Visit (HOSPITAL_COMMUNITY): Payer: Self-pay | Admitting: Internal Medicine

## 2020-08-25 DIAGNOSIS — Z853 Personal history of malignant neoplasm of breast: Secondary | ICD-10-CM

## 2020-08-25 DIAGNOSIS — J441 Chronic obstructive pulmonary disease with (acute) exacerbation: Secondary | ICD-10-CM | POA: Diagnosis not present

## 2020-08-25 DIAGNOSIS — F3160 Bipolar disorder, current episode mixed, unspecified: Secondary | ICD-10-CM | POA: Diagnosis not present

## 2020-08-25 DIAGNOSIS — R0602 Shortness of breath: Secondary | ICD-10-CM | POA: Diagnosis not present

## 2020-08-25 LAB — CBC
HCT: 31.3 % — ABNORMAL LOW (ref 36.0–46.0)
Hemoglobin: 9.4 g/dL — ABNORMAL LOW (ref 12.0–15.0)
MCH: 23.4 pg — ABNORMAL LOW (ref 26.0–34.0)
MCHC: 30 g/dL (ref 30.0–36.0)
MCV: 77.9 fL — ABNORMAL LOW (ref 80.0–100.0)
Platelets: 257 10*3/uL (ref 150–400)
RBC: 4.02 MIL/uL (ref 3.87–5.11)
RDW: 19.9 % — ABNORMAL HIGH (ref 11.5–15.5)
WBC: 15.5 10*3/uL — ABNORMAL HIGH (ref 4.0–10.5)
nRBC: 0 % (ref 0.0–0.2)

## 2020-08-25 LAB — BASIC METABOLIC PANEL
Anion gap: 9 (ref 5–15)
BUN: 11 mg/dL (ref 6–20)
CO2: 23 mmol/L (ref 22–32)
Calcium: 9.3 mg/dL (ref 8.9–10.3)
Chloride: 104 mmol/L (ref 98–111)
Creatinine, Ser: 0.81 mg/dL (ref 0.44–1.00)
GFR, Estimated: >60 mL/min (ref >60)
Glucose, Bld: 165 mg/dL — ABNORMAL HIGH (ref 70–99)
Potassium: 4.3 mmol/L (ref 3.5–5.1)
Sodium: 136 mmol/L (ref 135–145)

## 2020-08-25 MED ORDER — PREDNISONE 10 MG PO TABS: ORAL_TABLET | ORAL | 0 refills | Status: DC

## 2020-08-25 MED ORDER — MOMETASONE FURO-FORMOTEROL FUM 200-5 MCG/ACT IN AERO
2.0000 | INHALATION_SPRAY | Freq: Two times a day (BID) | RESPIRATORY_TRACT | Status: DC
Start: 2020-08-25 — End: 2021-01-11

## 2020-08-25 MED ORDER — MOMETASONE FURO-FORMOTEROL FUM 200-5 MCG/ACT IN AERO
1.0000 | INHALATION_SPRAY | Freq: Two times a day (BID) | RESPIRATORY_TRACT | Status: DC
Start: 2020-08-25 — End: 2020-08-25

## 2020-08-25 MED ORDER — AZITHROMYCIN 250 MG PO TABS: ORAL_TABLET | ORAL | 0 refills | Status: DC

## 2020-08-25 MED FILL — predniSONE 10 MG TABS: 10 | 11 days supply | Qty: 22 | Fill #0

## 2020-08-25 MED FILL — AZITHROMYCIN 250 MG TABS: 250 | 4 days supply | Qty: 4 | Fill #0

## 2020-08-25 NOTE — Discharge Summary (Signed)
Physician Discharge Summary  Kimberly [name] XTG:[id number] DOB: [birth year] DOA: 08/23/2020  PCP: Lurline Del, DO  Admit date: 08/23/2020 Discharge date: 08/25/2020  Admitted From: Home Disposition:  Home  Recommendations for Outpatient Follow-up:  1. Follow up with PCP in 1-2 weeks  Discharge Condition:Improved CODE STATUS:Full Diet recommendation: Regular   Brief/Interim Summary: 49 y.o.femalewith medical history significant forbipolar disorder, breast cancer status post chemotherapy and radiation, COVID-19 infection in September 2021, and treatment last month for AOM, now presenting to the emergency department for evaluation of shortness of breath and cough. Patient reports shortness of breath and cough for close to a month now, quit smoking a couple weeks ago due to this illness, but has continued to worsen, particularly over the last few days. She has been using duo nebs at home without any appreciable relief.  Discharge Diagnoses:  Principal Problem:   COPD exacerbation ([place]) Active Problems:   ANEMIA, IRON DEFICIENCY, CHRONIC   Mixed bipolar I disorder ([place])   History of breast cancer   1.COPD exacerbation -Patient reports hx of COPD, had COVID in September, and now presents with progressive SOB, cough, and wheeze -She is afebrile, no PE or other acute findings on CTA chest -She reports some improvement with steroids and breathing treatments in ED but still dyspneic at rest -Culture sputum, continue systemic steroids, started empiric azithromycin -Improvement with breathing treatments with addition of IV solumedrol -At time of d/c, lungs clear with no further wheezing. On room air -Will d/c with prednisone taper and complete azithro  2.Microcytic anemia -Hgb 10.5 on admission with MCV 76.6; both indices were normal last year -She denies melena or hematochezia, states she used to take iron supplements years ago -Iron noted to be 20  3.Bipolar  disorder -Continue Seroquelas tolerated  Discharge Instructions   Allergies as of 08/25/2020   No Known Allergies     Medication List    STOP taking these medications   benzonatate 100 MG capsule Commonly known as: TESSALON   clonazePAM 1 MG tablet Commonly known as: KLONOPIN   fluticasone 50 MCG/ACT nasal spray Commonly known as: FLONASE   lamoTRIgine 200 MG tablet Commonly known as: LAMICTAL   Lurasidone HCl 60 MG Tabs   ondansetron 4 MG tablet Commonly known as: ZOFRAN     TAKE these medications   acetaminophen 325 MG tablet Commonly known as: Tylenol Take 2 tablets (650 mg total) by mouth every 6 (six) hours as needed. What changed: reasons to take this   albuterol 108 (90 Base) MCG/ACT inhaler Commonly known as: VENTOLIN HFA Inhale 2 puffs into the lungs every 6 (six) hours as needed for wheezing or shortness of breath.   azithromycin 250 MG tablet Commonly known as: Zithromax 1 tab po daily x 4 more days, zero refills   busPIRone 15 MG tablet Commonly known as: BUSPAR Take 15 mg by mouth at bedtime.   ipratropium-albuterol 0.5-2.5 (3) MG/3ML Soln Commonly known as: DUONEB Take 3 mLs by nebulization every 4 (four) hours as needed.   mometasone-formoterol 200-5 MCG/ACT Aero Commonly known as: DULERA Inhale 1 puff into the lungs 2 (two) times daily.   pantoprazole 40 MG tablet Commonly known as: PROTONIX TAKE 1 TABLET DAILY BY MOUTH 30 TO 60 MINUTES BEFORE FIRST MEAL OF THE DAY   polyethylene glycol 17 g packet Commonly known as: MIRALAX / GLYCOLAX Take 17 g by mouth daily.   predniSONE 10 MG tablet Commonly known as: DELTASONE Take 4 tablets (40 mg total) by mouth daily with  breakfast for 3 days, THEN 2 tablets (20 mg total) daily with breakfast for 3 days, THEN 1 tablet (10 mg total) daily with breakfast for 3 days, THEN 0.5 tablets (5 mg total) daily with breakfast for 2 days. Start taking on: August 25, 2020   QUEtiapine 400 MG  tablet Commonly known as: SEROQUEL Take 400 mg by mouth at bedtime.   risperiDONE 3 MG tablet Commonly known as: RISPERDAL Take 1 tablet (3 mg total) by mouth at bedtime.       Follow-up Information    [facility] DEPT.   Specialty: Emergency Medicine Why: return to the ER for new or worsening symptoms Contact information: [place] [id number] [place] [address] [place], [place], DO. Schedule an appointment as soon as possible for a visit in 2 week(s).   Specialty: Family Medicine Contact information: [address] [place] [address] [phone]              No Known Allergies   Procedures/Studies: DG Chest 1 View  Result Date: 08/23/2020 CLINICAL DATA:  Shortness of breath, COVID-19 2 months ago EXAM: CHEST  1 VIEW COMPARISON:  04/17/2019 FINDINGS: Single frontal view of the chest demonstrates stable right chest wall port. Cardiac silhouette is unremarkable. No airspace disease, effusion, or pneumothorax. No acute bony abnormalities. IMPRESSION: 1. No acute intrathoracic process. Electronically Signed   By: Randa Ngo M.D.   On: 08/23/2020 22:56   CT Angio Chest PE W and/or Wo Contrast  Result Date: 08/24/2020 CLINICAL DATA:  Cough, dyspnea, remote history breast cancer EXAM: CT ANGIOGRAPHY CHEST WITH CONTRAST TECHNIQUE: Multidetector CT imaging of the chest was performed using the standard protocol during bolus administration of intravenous contrast. Multiplanar CT image reconstructions and MIPs were obtained to evaluate the vascular anatomy. CONTRAST:  156mL OMNIPAQUE IOHEXOL 350 MG/ML SOLN COMPARISON:  03/06/2017 FINDINGS: Cardiovascular: The examination is slightly limited by motion artifact. There is adequate opacification of the pulmonary arterial tree through the level of the proximal segmental arteries. More distally, motion artifact limits evaluation. There is no intraluminal  filling defect identified to suggest acute pulmonary embolism. The central pulmonary arteries are of normal caliber. Global cardiac size is within normal limits. No pericardial effusion. Mild atherosclerotic calcification noted within the aortic arch. Thoracic aorta is of normal caliber. Mediastinum/Nodes: No enlarged mediastinal, hilar, or axillary lymph nodes. Thyroid gland, trachea, and esophagus demonstrate no significant findings. Lungs/Pleura: Minimal left basilar atelectasis. Lungs are otherwise clear. No pneumothorax or pleural effusion. Central airways are widely patent. Upper Abdomen: Cholecystectomy has been performed. No acute abnormality. Musculoskeletal: No acute bone abnormality. No lytic or blastic bone lesion. Surgical clips related to left breast lumpectomy and sentinel lymph node biopsy are identified. Review of the MIP images confirms the above findings. IMPRESSION: No pulmonary embolism. No acute abnormality. No radiographic explanation for the patient's reported symptoms. Aortic Atherosclerosis (ICD10-I70.0). Electronically Signed   By: Fidela Salisbury MD   On: 08/24/2020 03:55     Subjective: Very eager to go home  Discharge Exam: Vitals:   08/25/20 0636 08/25/20 0923  BP: 115/76   Pulse: 89   Resp: 20   Temp: (!) 97.5 F (36.4 C)   SpO2: (!) 88% 92%   Vitals:   08/24/20 1933 08/24/20 2048 08/25/20 0636 08/25/20 0923  BP:  116/90 115/76   Pulse:  95 89   Resp:  20 20   Temp:  98.3 F (36.8  C) (!) 97.5 F (36.4 C)   TempSrc:  Oral Oral   SpO2: 96% 100% (!) 88% 92%  Weight:      Height:        General: Pt is alert, awake, not in acute distress Cardiovascular: RRR, S1/S2 +, no rubs, no gallops Respiratory: CTA bilaterally, no wheezing, no rhonchi Abdominal: Soft, NT, ND, bowel sounds + Extremities: no edema, no cyanosis   The results of significant diagnostics from this hospitalization (including imaging, microbiology, ancillary and laboratory) are listed  below for reference.     Microbiology: Recent Results (from the past 240 hour(s))  Resp Panel by RT PCR (RSV, Flu A&B, Covid) - Nasopharyngeal Swab     Status: None   Collection Time: 08/24/20 12:33 AM   Specimen: Nasopharyngeal Swab  Result Value Ref Range Status   SARS Coronavirus 2 by RT PCR NEGATIVE NEGATIVE Final    Comment: (NOTE) SARS-CoV-2 target nucleic acids are NOT DETECTED.  The SARS-CoV-2 RNA is generally detectable in upper respiratoy specimens during the acute phase of infection. The lowest concentration of SARS-CoV-2 viral copies this assay can detect is 131 copies/mL. A negative result does not preclude SARS-Cov-2 infection and should not be used as the sole basis for treatment or other patient management decisions. A negative result may occur with  improper specimen collection/handling, submission of specimen other than nasopharyngeal swab, presence of viral mutation(s) within the areas targeted by this assay, and inadequate number of viral copies (<131 copies/mL). A negative result must be combined with clinical observations, patient history, and epidemiological information. The expected result is Negative.  Fact Sheet for Patients:  PinkCheek.be  Fact Sheet for Healthcare Providers:  GravelBags.it  This test is no t yet approved or cleared by the [place] FDA and  has been authorized for detection and/or diagnosis of SARS-CoV-2 by FDA under an Emergency Use Authorization (EUA). This EUA will remain  in effect (meaning this test can be used) for the duration of the COVID-19 declaration under Section 564(b)(1) of the Act, 21 U.S.C. section 360bbb-3(b)(1), unless the authorization is terminated or revoked sooner.     Influenza A by PCR NEGATIVE NEGATIVE Final   Influenza B by PCR NEGATIVE NEGATIVE Final    Comment: (NOTE) The Xpert Xpress SARS-CoV-2/FLU/RSV assay is intended as an aid in  the  diagnosis of influenza from Nasopharyngeal swab specimens and  should not be used as a sole basis for treatment. Nasal washings and  aspirates are unacceptable for Xpert Xpress SARS-CoV-2/FLU/RSV  testing.  Fact Sheet for Patients: PinkCheek.be  Fact Sheet for Healthcare Providers: GravelBags.it  This test is not yet approved or cleared by the [place] FDA and  has been authorized for detection and/or diagnosis of SARS-CoV-2 by  FDA under an Emergency Use Authorization (EUA). This EUA will remain  in effect (meaning this test can be used) for the duration of the  Covid-19 declaration under Section 564(b)(1) of the Act, 21  U.S.C. section 360bbb-3(b)(1), unless the authorization is  terminated or revoked.    Respiratory Syncytial Virus by PCR NEGATIVE NEGATIVE Final    Comment: (NOTE) Fact Sheet for Patients: PinkCheek.be  Fact Sheet for Healthcare Providers: GravelBags.it  This test is not yet approved or cleared by the [place] FDA and  has been authorized for detection and/or diagnosis of SARS-CoV-2 by  FDA under an Emergency Use Authorization (EUA). This EUA will remain  in effect (meaning this test can be used) for the duration of the  COVID-19 declaration under Section 564(b)(1) of the Act, 21 U.S.C.  section 360bbb-3(b)(1), unless the authorization is terminated or  revoked. Performed at [facility], [place] [address] [place], [place] [address]   Blood culture (routine x 2)     Status: None (Preliminary result)   Collection Time: 08/24/20  2:19 AM   Specimen: BLOOD  Result Value Ref Range Status   Specimen Description   Final    BLOOD RIGHT ANTECUBITAL Performed at [facility] [address] [place], [place] [address]    Special Requests   Final    BOTTLES DRAWN AEROBIC AND ANAEROBIC Blood Culture  adequate volume Performed at [facility] [address] [place], [place] [address]    Culture   Final    NO GROWTH 1 DAY Performed at [facility], [place] [address] [place], [place] [address]    Report Status PENDING  Incomplete  Blood culture (routine x 2)     Status: None (Preliminary result)   Collection Time: 08/24/20  2:24 AM   Specimen: BLOOD  Result Value Ref Range Status   Specimen Description   Final    BLOOD LEFT ANTECUBITAL Performed at [facility] [address], [place], [place] [address]    Special Requests   Final    BOTTLES DRAWN AEROBIC AND ANAEROBIC Blood Culture adequate volume Performed at [facility] [address], [place], [place] [address]    Culture   Final    NO GROWTH 1 DAY Performed at [facility], [place] [address] [place], [place] [address]    Report Status PENDING  Incomplete  Culture, sputum-assessment     Status: None   Collection Time: 08/24/20  8:53 AM   Specimen: Expectorated Sputum  Result Value Ref Range Status   Specimen Description EXPECTORATED SPUTUM  Final   Special Requests Normal  Final   Sputum evaluation   Final    Sputum specimen not acceptable for testing.  Please recollect.   INFORMED RN @0937  ON 11.3.2021 BY [facility] Performed at [facility], [place] [address] [place], [place] [address]    Report Status 08/24/2020 FINAL  Final  Expectorated sputum assessment w rflx to resp cult     Status: None   Collection Time: 08/24/20  2:30 PM  Result Value Ref Range Status   Specimen Description EXPECTORATED SPUTUM  Final   Special Requests NONE  Final   Sputum evaluation   Final    SPECIMEN MICROSCOPICALLY RESEMBLES SALIVA, NOT CULTURED Sputum specimen not acceptable for testing.  Please recollect.   Performed at [facility], [place] [address], [place], [place] [address]    Report Status 08/24/2020 FINAL  Final      Labs: BNP (last 3 results) Recent Labs    03/10/20 1732 08/24/20 0229  BNP 10.1 00.9   Basic Metabolic Panel: Recent Labs  Lab 08/23/20 2345 08/24/20 0223 08/24/20 0550 08/25/20 0543  NA 136  --  134* 136  K 3.5  --  3.4* 4.3  CL 103  --  101 104  CO2 24  --  24 23  GLUCOSE 95  --  139* 165*  BUN 6  --  7 11  CREATININE 0.86  --  0.94 0.81  CALCIUM 9.3  --  9.4 9.3  MG  --  3.1*  --   --    Liver Function Tests: No results for input(s): AST,  ALT, ALKPHOS, BILITOT, PROT, ALBUMIN in the last 168 hours. No results for input(s): LIPASE, AMYLASE in the last 168 hours. No results for input(s): AMMONIA in the last 168 hours. CBC: Recent Labs  Lab 08/23/20 2345 08/24/20 0550 08/25/20 0543  WBC 7.8 6.7 15.5*  NEUTROABS 3.7  --   --   HGB 10.5* 10.3* 9.4*  HCT 34.4* 33.9* 31.3*  MCV 76.6* 76.5* 77.9*  PLT 272 269 257   Cardiac Enzymes: No results for input(s): CKTOTAL, CKMB, CKMBINDEX, TROPONINI in the last 168 hours. BNP: Invalid input(s): POCBNP CBG: No results for input(s): GLUCAP in the last 168 hours. D-Dimer No results for input(s): DDIMER in the last 72 hours. Hgb A1c No results for input(s): HGBA1C in the last 72 hours. Lipid Profile No results for input(s): CHOL, HDL, LDLCALC, TRIG, CHOLHDL, LDLDIRECT in the last 72 hours. Thyroid function studies No results for input(s): TSH, T4TOTAL, T3FREE, THYROIDAB in the last 72 hours.  Invalid input(s): FREET3 Anemia work up Recent Labs    08/24/20 0550 08/24/20 0855  VITAMINB12  --  181  FOLATE  --  8.9  FERRITIN  --  4*  TIBC  --  518*  IRON  --  20*  RETICCTPCT 1.4  --    Urinalysis    Component Value Date/Time   COLORURINE YELLOW 03/07/2017 2111   APPEARANCEUR CLEAR 03/07/2017 2111   LABSPEC 1.016 03/07/2017 2111   PHURINE 7.0 03/07/2017 2111   GLUCOSEU NEGATIVE 03/07/2017 2111   HGBUR NEGATIVE 03/07/2017 2111   BILIRUBINUR negative 03/10/2020 1515   KETONESUR negative 03/10/2020 1515    KETONESUR NEGATIVE 03/07/2017 2111   PROTEINUR =30 (A) 03/10/2020 1515   PROTEINUR NEGATIVE 03/07/2017 2111   UROBILINOGEN 0.2 03/10/2020 1515   UROBILINOGEN 0.2 07/10/2014 1951   NITRITE Negative 03/10/2020 1515   NITRITE NEGATIVE 03/07/2017 2111   LEUKOCYTESUR Negative 03/10/2020 1515   Sepsis Labs Invalid input(s): PROCALCITONIN,  WBC,  LACTICIDVEN Microbiology Recent Results (from the past 240 hour(s))  Resp Panel by RT PCR (RSV, Flu A&B, Covid) - Nasopharyngeal Swab     Status: None   Collection Time: 08/24/20 12:33 AM   Specimen: Nasopharyngeal Swab  Result Value Ref Range Status   SARS Coronavirus 2 by RT PCR NEGATIVE NEGATIVE Final    Comment: (NOTE) SARS-CoV-2 target nucleic acids are NOT DETECTED.  The SARS-CoV-2 RNA is generally detectable in upper respiratoy specimens during the acute phase of infection. The lowest concentration of SARS-CoV-2 viral copies this assay can detect is 131 copies/mL. A negative result does not preclude SARS-Cov-2 infection and should not be used as the sole basis for treatment or other patient management decisions. A negative result may occur with  improper specimen collection/handling, submission of specimen other than nasopharyngeal swab, presence of viral mutation(s) within the areas targeted by this assay, and inadequate number of viral copies (<131 copies/mL). A negative result must be combined with clinical observations, patient history, and epidemiological information. The expected result is Negative.  Fact Sheet for Patients:  PinkCheek.be  Fact Sheet for Healthcare Providers:  GravelBags.it  This test is no t yet approved or cleared by the [place] FDA and  has been authorized for detection and/or diagnosis of SARS-CoV-2 by FDA under an Emergency Use Authorization (EUA). This EUA will remain  in effect (meaning this test can be used) for the duration of  the COVID-19 declaration under Section 564(b)(1) of the Act, 21 U.S.C. section 360bbb-3(b)(1), unless the authorization is terminated or revoked sooner.  Influenza A by PCR NEGATIVE NEGATIVE Final   Influenza B by PCR NEGATIVE NEGATIVE Final    Comment: (NOTE) The Xpert Xpress SARS-CoV-2/FLU/RSV assay is intended as an aid in  the diagnosis of influenza from Nasopharyngeal swab specimens and  should not be used as a sole basis for treatment. Nasal washings and  aspirates are unacceptable for Xpert Xpress SARS-CoV-2/FLU/RSV  testing.  Fact Sheet for Patients: PinkCheek.be  Fact Sheet for Healthcare Providers: GravelBags.it  This test is not yet approved or cleared by the [place] FDA and  has been authorized for detection and/or diagnosis of SARS-CoV-2 by  FDA under an Emergency Use Authorization (EUA). This EUA will remain  in effect (meaning this test can be used) for the duration of the  Covid-19 declaration under Section 564(b)(1) of the Act, 21  U.S.C. section 360bbb-3(b)(1), unless the authorization is  terminated or revoked.    Respiratory Syncytial Virus by PCR NEGATIVE NEGATIVE Final    Comment: (NOTE) Fact Sheet for Patients: PinkCheek.be  Fact Sheet for Healthcare Providers: GravelBags.it  This test is not yet approved or cleared by the [place] FDA and  has been authorized for detection and/or diagnosis of SARS-CoV-2 by  FDA under an Emergency Use Authorization (EUA). This EUA will remain  in effect (meaning this test can be used) for the duration of the  COVID-19 declaration under Section 564(b)(1) of the Act, 21 U.S.C.  section 360bbb-3(b)(1), unless the authorization is terminated or  revoked. Performed at [facility], [place] [address] [place], [place] [address]   Blood culture (routine x 2)     Status: None  (Preliminary result)   Collection Time: 08/24/20  2:19 AM   Specimen: BLOOD  Result Value Ref Range Status   Specimen Description   Final    BLOOD RIGHT ANTECUBITAL Performed at [facility] [address] [place], [place] [address]    Special Requests   Final    BOTTLES DRAWN AEROBIC AND ANAEROBIC Blood Culture adequate volume Performed at [facility] [address], [place], [place] [address]    Culture   Final    NO GROWTH 1 DAY Performed at [facility], [place] [address] [place], [place] [address]    Report Status PENDING  Incomplete  Blood culture (routine x 2)     Status: None (Preliminary result)   Collection Time: 08/24/20  2:24 AM   Specimen: BLOOD  Result Value Ref Range Status   Specimen Description   Final    BLOOD LEFT ANTECUBITAL Performed at [facility] [address], [place], [place] [address]    Special Requests   Final    BOTTLES DRAWN AEROBIC AND ANAEROBIC Blood Culture adequate volume Performed at [facility] [address], [place], [place] [address]    Culture   Final    NO GROWTH 1 DAY Performed at [facility], [place] [address] [place], [place] [address]    Report Status PENDING  Incomplete  Culture, sputum-assessment     Status: None   Collection Time: 08/24/20  8:53 AM   Specimen: Expectorated Sputum  Result Value Ref Range Status   Specimen Description EXPECTORATED SPUTUM  Final   Special Requests Normal  Final   Sputum evaluation   Final    Sputum specimen not acceptable for testing.  Please recollect.   INFORMED RN @0937  ON 11.3.2021 BY [facility] Performed at [facility], [address] [place], [place] [address]    Report Status 08/24/2020 FINAL  Final  Expectorated sputum assessment w rflx to resp cult     Status: None   Collection Time: 08/24/20  2:30 PM  Result Value Ref Range Status   Specimen Description EXPECTORATED  SPUTUM  Final   Special Requests NONE  Final   Sputum evaluation   Final    SPECIMEN MICROSCOPICALLY RESEMBLES SALIVA, NOT CULTURED Sputum specimen not acceptable for testing.  Please recollect.   Performed at [facility], [place] [address], [place], [place] [address]    Report Status 08/24/2020 FINAL  Final   Time spent: 30 min  SIGNED:   Marylu Lund, MD  [facility] 08/25/2020, 12:34 PM  If 7PM-7AM, please contact night-coverage

## 2020-08-25 NOTE — Progress Notes (Signed)
Patient has been given discharge instructions; all questions have been answered successfully. Patient will be leaving by private vehicle.

## 2020-08-25 NOTE — Progress Notes (Signed)
Chaplain engaged in initial visit with Ms. Owens Shark.  Chaplain explained her role and offered support.  During visit, chaplain learned that Ms. Carpino lost her husband four years ago.  She stated during that time she was first diagnosed with COPD but forgot about that diagnosis.  Chaplain could assess that her husband's death created a lot of change and trauma in her life.  She recalled that she had to be rushed to the hospital for passing out at her husband's funeral.  Ms. Gatt detailed that her husband was her "best friend," "apostle," "cut-up" partner, and more.  Ms. Fleece notes that her husband spoiled her and she was able to have whatever she wanted and needed. They were also heavily involved in the church together and building a ministry.  She spent a lot of time working with those in [employer] and was able to build a community of friends and family.  She states that she now spends a lot of time at home now and rarely goes anywhere besides [place] which is close to her home.    Ms. Mahl also shared that she has five children.  One of her daughters is currently in foster care but she is able to see her on the weekends.  She notes that if she is not discharged by tomorrow at 3:00 pm she will be leaving anyway because she has to go pick up her daughter from [place] so that they can spend the weekend together.  Ms. Oberry seems to have a relatively good relationship with all her children and states that talks to her son almost every day.    Chaplain offered the ministries of presence, listening and prayer.  Chaplain could assess that Ms. Urda still feels a great amount of grief regarding her husband's death.  Chaplain could see the various emotions Ms. Morsch traveled through throughout their conversation which included both laughter and tears. Chaplain could also recognize Ms. Kvamme's attachment to faith, God and church to process that grief and order the ways in which she treats others.  Spirituality  was a major concept and thread throughout our time together.  Chaplain will follow-up as needed.      08/25/20 1100  Clinical Encounter Type  Visited With Patient  Visit Type Initial

## 2020-08-25 NOTE — Care Management Obs Status (Signed)
[facility] NOTIFICATION   Patient Details  Name: Kimberly [name] MRN: [id number] Date of Birth: 1971/06/11   Medicare Observation Status Notification Given:  Yes    MahabirJuliann Pulse, RN 08/25/2020, 12:54 PM

## 2020-08-29 ENCOUNTER — Telehealth: Payer: Self-pay

## 2020-08-29 LAB — CULTURE, BLOOD (ROUTINE X 2)
Culture: NO GROWTH
Culture: NO GROWTH
Special Requests: ADEQUATE
Special Requests: ADEQUATE

## 2020-08-29 NOTE — Telephone Encounter (Signed)
Patient calls nurse line requesting refill on antibiotic Augmentin. Patient reports that antibiotic was helping with pain and drainage, however, she has run out and pain is returning.   Advised patient that follow up appointment would likely be needed to further assess. Also informed that referral had been placed for ENT. Patient is requesting to wait for doctor's instructions.   Patient continues to report ear pain, popping and clear to pale yellow drainage from ear. Denies having changes in hearing or fever.   Will forward to PCP and Dr. Marcelyn Bruins, RN

## 2020-08-30 NOTE — Telephone Encounter (Signed)
Called patient. She still hasn't heard from ENT but it has only been 1-1.5 weeks since the referral. Patient plans to make appointment with me to discuss COPD that she was recently in the ED for. No immediate concerns but the treatment for her recent ED visit did seem to help her ear pain per her. She plans to let me know if she hasnt heard from ENT by Friday of this week as that will be 2 weeks.

## 2020-10-13 ENCOUNTER — Ambulatory Visit (INDEPENDENT_AMBULATORY_CARE_PROVIDER_SITE_OTHER): Payer: Medicare Other | Admitting: Family Medicine

## 2020-10-13 DIAGNOSIS — Z5329 Procedure and treatment not carried out because of patient's decision for other reasons: Secondary | ICD-10-CM | POA: Insufficient documentation

## 2020-10-13 DIAGNOSIS — Z91199 Patient's noncompliance with other medical treatment and regimen due to unspecified reason: Secondary | ICD-10-CM | POA: Insufficient documentation

## 2020-10-13 NOTE — Progress Notes (Signed)
Patient no showed her appointment today 10/13/2020.  Patient also had late cancellations 03/04/2020 and 02/19/2020.  Patient was sent a letter regarding the missed appointments and stating our no-show policy.   Milus Banister, [place], PGY-3 10/13/2020 3:53 PM

## 2020-10-31 ENCOUNTER — Other Ambulatory Visit: Payer: Self-pay

## 2020-10-31 ENCOUNTER — Emergency Department (HOSPITAL_COMMUNITY)
Admission: EM | Admit: 2020-10-31 | Discharge: 2020-11-01 | Disposition: A | Discharge status: Home / Self Care | Payer: Medicare Other | Attending: Emergency Medicine | Admitting: Emergency Medicine

## 2020-10-31 ENCOUNTER — Encounter (HOSPITAL_COMMUNITY): Payer: Self-pay

## 2020-10-31 DIAGNOSIS — R509 Fever, unspecified: Secondary | ICD-10-CM | POA: Diagnosis not present

## 2020-10-31 DIAGNOSIS — R059 Cough, unspecified: Secondary | ICD-10-CM | POA: Diagnosis not present

## 2020-10-31 DIAGNOSIS — Z8616 Personal history of COVID-19: Secondary | ICD-10-CM | POA: Diagnosis not present

## 2020-10-31 DIAGNOSIS — Z5321 Procedure and treatment not carried out due to patient leaving prior to being seen by health care provider: Secondary | ICD-10-CM | POA: Insufficient documentation

## 2020-10-31 DIAGNOSIS — R042 Hemoptysis: Secondary | ICD-10-CM | POA: Insufficient documentation

## 2020-10-31 DIAGNOSIS — R0602 Shortness of breath: Secondary | ICD-10-CM | POA: Diagnosis not present

## 2020-10-31 HISTORY — DX: Chronic obstructive pulmonary disease, unspecified: J44.9

## 2020-10-31 NOTE — ED Triage Notes (Addendum)
Per EMS-Patient reports SOB and hemoptysis x 1 day. Patient reports Covid 2 months ago. Right lungs clear. Left lungs congested. Patient also reported a fever, but took Tylenol prior to EMS arrival.   Patient states she had bright red blood 11 times , the size of a half dollar.

## 2020-11-01 NOTE — ED Notes (Signed)
Pt called 3x for room placement. Eloped from waiting area.  

## 2020-12-27 ENCOUNTER — Encounter: Payer: Self-pay | Admitting: Family Medicine

## 2020-12-27 ENCOUNTER — Ambulatory Visit (INDEPENDENT_AMBULATORY_CARE_PROVIDER_SITE_OTHER): Payer: Medicare Other | Admitting: Family Medicine

## 2020-12-27 ENCOUNTER — Ambulatory Visit (HOSPITAL_COMMUNITY)
Admission: RE | Admit: 2020-12-27 | Discharge: 2020-12-27 | Disposition: A | Discharge status: Home / Self Care | Payer: Medicare Other | Source: Ambulatory Visit | Attending: Family Medicine | Admitting: Family Medicine

## 2020-12-27 ENCOUNTER — Other Ambulatory Visit: Payer: Self-pay

## 2020-12-27 VITALS — BP 125/60 | HR 101 | Ht 68.0 in | Wt 300.4 lb

## 2020-12-27 DIAGNOSIS — R0789 Other chest pain: Secondary | ICD-10-CM | POA: Insufficient documentation

## 2020-12-27 DIAGNOSIS — R21 Rash and other nonspecific skin eruption: Secondary | ICD-10-CM

## 2020-12-27 DIAGNOSIS — R079 Chest pain, unspecified: Secondary | ICD-10-CM | POA: Diagnosis not present

## 2020-12-27 HISTORY — DX: Rash and other nonspecific skin eruption: R21

## 2020-12-27 MED ORDER — HYDROCORTISONE 2.5 % EX OINT: TOPICAL_OINTMENT | Freq: Two times a day (BID) | CUTANEOUS | 0 refills | Status: DC

## 2020-12-27 MED ORDER — CETIRIZINE HCL 10 MG PO TABS: 10.0000 mg | ORAL_TABLET | Freq: Every day | ORAL | 11 refills | Status: DC | PRN

## 2020-12-27 NOTE — Patient Instructions (Signed)
Thank you for coming to see me today. It was a pleasure. Today we talked about:   If you have chest pain that does not improve after a minute, or worsening of your symptoms, please come back right away.  You can use zyrtec daily for your itching and also use hydrocortisone cream that I sent to the pharmacy.  Try to avoid itching.  Please follow-up on Friday.  If you have any questions or concerns, please do not hesitate to call the office at [phone].  Best,   [place] Constable, DO

## 2020-12-27 NOTE — Progress Notes (Signed)
SUBJECTIVE:   CHIEF COMPLAINT / HPI:   Rash and Bites on Skin Noticed Monday morning when she woke up Was away visiting family this weekend Slept on cousin's couch Saturday night and thinks they have fleas Has marks on bilateral arms, legs, back, abdomen She states that since the lesions popped up yesterday morning, she has not noticed any other new ones Reports that none of her family has any lesions at this time, but her daughter had something similar last week and was told to use hydrocortisone cream She has been using over-the-counter hydrocortisone cream and calamine lotion, but continues to itch She has difficulty sleeping because of the itching She reports that she has not had any fevers She has new dogs that she just got, but states that she treated them for fleas and has not noticed any fleas or bugs in the house She reports that she has been outside recently  Chest Pain Happens when sitting 6 months None at present Every other day, sometimes every day Feels like squeezing of her heart Feels like she can't move when it happens Lasts 1-2 min Doesn't occur with exertion When it happens, SOB doesn't worsen Sweating when she has the pain, cold sweat No nausea She states that she is always tried and doesn't know why Mother died of a heart attach at age 61  Last Lipid Panel 02/11/2020, LDL 111   States that when she was in the hospital in November, her "heart stopped"   PERTINENT  PMH / PSH: COPD, obesity, iron deficiency anemia, mixed bipolar one disorder, follows with psychiatry  OBJECTIVE:   BP 125/60   Pulse (!) 101   Ht 5\' 8"  (1.727 m)   Wt (!) 300 lb 6.4 oz (136.3 kg)   SpO2 98%   BMI 45.68 kg/m    Physical Exam:  General: 50 y.o. female in NAD Cardio: RRR no m/r/g, HR 100 on exam, pain reproduced with palpation of left 3-4th ribs Lungs: CTAB, no wheezing, no rhonchi, no crackles, no IWOB on RA Abdomen: Soft, non-tender to palpation, non-distended,  positive bowel sounds Skin: warm and dry, scattered erythematous wheals on BUE, left leg, abdomen, and back, see images below, few overlying excoritations Extremities: No edema Psych: anxious appearing, somewhat pressured speech, occasional tangential thought process, no SI            ASSESSMENT/PLAN:   Rash and nonspecific skin eruption Appears to be secondary to a bug bite of some sort.  Reassured that she has not had any new lesions palpable.  This does not appear to be scabies or bedbugs, therefore no specific treatment needed.  She is on numerous sedating medications from her psychiatrist, considered hydroxyzine, but in the setting of multiple sedating medications, would avoid at this time.  We will start her on Zyrtec and send an Rx for hydrocortisone 2.5% ointment to use twice daily for no more than 2 weeks.  Also advised to avoid scratching the area to reduce risk of superimposed bacterial infection.  She voiced understanding.  Advised her that if she has new lesions follow-up or if these do not improve to come back.  Atypical chest pain History and examination are less concerning for chest pain that is cardiac in etiology.  However, patient has multiple risk factors including smoking and a rather significant family history of an MI at age of 59.  An EKG was performed today which did not show any acute changes.  She does have a prolonged QTC  of 474, this should be monitored closely given her numerous psychiatric medications that can prolong QTC.  She mentioned her "heart stopping" in the hospital, but cannot find mention of this.  Does not seem to be a true cardiac arrest as she states that someone "woke me up and told me my heart had stopped."  Her lipid panel is obtained in April 2021, LDL is 111 at that time.  Could consider a statin in the future for primary prevention.  Overall, she would likely benefit from more extensive look at this chest pain and her cardiac risk factors, as it  was mentioned at the end of her visit.  She was given ED precautions including worsening of chest pain that does not resolve or worsening of any symptoms.  She voiced understanding.  Scheduled for 3/11 in ATC to f/u chest pain.  Also scheduled with her PCP for other chronic medical concerns for his first available on 3/21.     Cleophas Dunker, [place]

## 2020-12-27 NOTE — Assessment & Plan Note (Signed)
Appears to be secondary to a bug bite of some sort.  Reassured that she has not had any new lesions palpable.  This does not appear to be scabies or bedbugs, therefore no specific treatment needed.  She is on numerous sedating medications from her psychiatrist, considered hydroxyzine, but in the setting of multiple sedating medications, would avoid at this time.  We will start her on Zyrtec and send an Rx for hydrocortisone 2.5% ointment to use twice daily for no more than 2 weeks.  Also advised to avoid scratching the area to reduce risk of superimposed bacterial infection.  She voiced understanding.  Advised her that if she has new lesions follow-up or if these do not improve to come back.

## 2020-12-27 NOTE — Assessment & Plan Note (Addendum)
History and examination are less concerning for chest pain that is cardiac in etiology.  However, patient has multiple risk factors including smoking and a rather significant family history of an MI at age of 79.  An EKG was performed today which did not show any acute changes.  She does have a prolonged QTC of 474, this should be monitored closely given her numerous psychiatric medications that can prolong QTC.  She mentioned her "heart stopping" in the hospital, but cannot find mention of this.  Does not seem to be a true cardiac arrest as she states that someone "woke me up and told me my heart had stopped."  Her lipid panel is obtained in April 2021, LDL is 111 at that time.  Could consider a statin in the future for primary prevention.  Overall, she would likely benefit from more extensive look at this chest pain and her cardiac risk factors, as it was mentioned at the end of her visit.  She was given ED precautions including worsening of chest pain that does not resolve or worsening of any symptoms.  She voiced understanding.  Scheduled for 3/11 in ATC to f/u chest pain.  Also scheduled with her PCP for other chronic medical concerns for his first available on 3/21.

## 2020-12-30 ENCOUNTER — Ambulatory Visit (INDEPENDENT_AMBULATORY_CARE_PROVIDER_SITE_OTHER): Payer: Medicare Other | Admitting: Family Medicine

## 2020-12-30 ENCOUNTER — Ambulatory Visit (HOSPITAL_COMMUNITY)
Admission: RE | Admit: 2020-12-30 | Discharge: 2020-12-30 | Disposition: A | Discharge status: Home / Self Care | Payer: Medicare Other | Source: Ambulatory Visit | Attending: Family Medicine | Admitting: Family Medicine

## 2020-12-30 ENCOUNTER — Other Ambulatory Visit: Payer: Self-pay

## 2020-12-30 VITALS — BP 122/80 | HR 96 | Ht 68.0 in | Wt 297.0 lb

## 2020-12-30 DIAGNOSIS — R0789 Other chest pain: Secondary | ICD-10-CM | POA: Insufficient documentation

## 2020-12-30 DIAGNOSIS — E7800 Pure hypercholesterolemia, unspecified: Secondary | ICD-10-CM | POA: Diagnosis not present

## 2020-12-30 DIAGNOSIS — D508 Other iron deficiency anemias: Secondary | ICD-10-CM | POA: Diagnosis not present

## 2020-12-30 MED ORDER — ROSUVASTATIN CALCIUM 10 MG PO TABS: 10.0000 mg | ORAL_TABLET | Freq: Every day | ORAL | 1 refills | Status: DC

## 2020-12-30 NOTE — Progress Notes (Signed)
SUBJECTIVE:   CHIEF COMPLAINT / HPI:   Noncardiac chest pain, follow-up: Patient was seen in clinic 12/27/2020 reporting chest pain that has been occurring over the last 6 months daily or every other day while seated occasionally feels like squeezing of her heart.  She reports today 3/11 that she has not had any more of these episodes, but was holding her grandbaby and lifted them up to get their forehead when she felt a tugging pulling in her chest.  She put the baby back down and then try to lift them again and felt the same sensation.  Pain is reproducible on today's exam.  She denies associated shortness of breath.  Pain does not occur with exertion.  Patient has previously had associated cold sweats with the pain; however, she denies nausea.  Certainly, her mom died of a heart attack at age 34.    Patient has significant smoking history and COPD.  Does have prolonged QTC of 474 on EKG performed at last office visit, today patient's QTC is 452, stable.  Especially concerning considering her numerous psychiatric medications.  Patient's last CBC with a hemoglobin of 9.4. LDL 111 in April 2021, patient would benefit from statin for primary prevention; however, patient's ASCVD risk 1.4%.   History of anemia: Patient's last CBC in November 2021 was 9.4, MCV 77.  Patient left before being able to have repeat CBC.  Elevated transaminases: Patient noted on last CMP to have elevated transaminases.  Patient left before being able to have repeat CMP.  PERTINENT  PMH / PSH:  Patient Active Problem List   Diagnosis Date Noted  . Elevated LDL cholesterol level 12/30/2020  . Rash and nonspecific skin eruption 12/27/2020  . Noncardiac chest pain 12/27/2020  . No-show for appointment 10/13/2020  . COPD exacerbation ([place]) 08/24/2020  . Otitis media 08/10/2020  . Otitis externa of left ear 08/02/2020  . Oligouria 03/10/2020  . History of breast cancer 02/11/2020  . Total body pain 01/13/2019  .  Postmenopausal bleeding 10/25/2017  . Chronic pain of both knees 05/03/2017  . Upper airway cough syndrome 03/27/2017  . Discogenic low back pain 03/20/2017  . COPD (chronic obstructive pulmonary disease) ([place]) 03/20/2017  . Tachycardia   . Dyspnea on exertion   . Grieving 11/21/2016  . Mixed incontinence 09/29/2015  . Genetic testing 08/26/2015  . Malignant neoplasm of upper-outer quadrant of left breast in female, estrogen receptor positive ([place]) 05/07/2014  . Disorder of bladder 09/29/2008  . LOW BACK PAIN, CHRONIC 09/29/2008  . Morbid (severe) obesity due to excess calories ([place]) 11/19/2007  . ANEMIA, IRON DEFICIENCY, CHRONIC 11/14/2007  . Mixed bipolar I disorder ([place]) 02/20/2007  . Tobacco use disorder 02/20/2007    OBJECTIVE:   BP 122/80   Pulse 96   Ht 5\' 8"  (1.727 m)   Wt 297 lb (134.7 kg)   SpO2 99%   BMI 45.16 kg/m    Physical exam: General: Well-appearing patient, no apparent distress Respiratory: Comfortable work of breathing, CTA bilaterally Cardio: RRR, S1-S2 present, no murmurs appreciated, tenderness to palpation of chest wall is present, patient's pains are reproducible on exam Abdomen: Normal bowel sounds, no masses appreciated   ASSESSMENT/PLAN:   Elevated LDL cholesterol level Patient's last LDL in April 2021 only 111.  Previously was as elevated as 181.  Patient was started on Crestor 10 mg today; however, patient's ASCVD risk based off of her previous lipid panel is only 1.4%. -Patient will follow up with her PCP  on 3/23 -See how she is tolerating Crestor -Recheck patient's lipid panel at 3/23 visit  Noncardiac chest pain With noncardiac chest pain, pain is reproducible on physical exam, feel it is more MSK in nature; however, patient does have risk factors with history of smoking and mother's cardiac death at 39 years old.  EKG repeated, QTC stable at 452. -Patient given information regarding various chest pains -Strict return precautions  provided  ANEMIA, IRON DEFICIENCY, CHRONIC Patient previously noted to have anemia with hemoglobin at 9.4, MCV at 77. -Recommend PCP recheck CBC at 3/23 appointment   Elevated transaminases Patient left before being able to have CMP rechecked. -Recommend PCP recheck CMP at 3/23 appointment  Daisy Floro, [place]

## 2020-12-30 NOTE — Assessment & Plan Note (Addendum)
With noncardiac chest pain, pain is reproducible on physical exam, feel it is more MSK in nature; however, patient does have risk factors with history of smoking and mother's cardiac death at 49 years old.  EKG repeated, QTC stable at 452. -Patient given information regarding various chest pains -Strict return precautions provided

## 2020-12-30 NOTE — Assessment & Plan Note (Signed)
Patient's last LDL in April 2021 only 111.  Previously was as elevated as 181.  Patient was started on Crestor 10 mg today; however, patient's ASCVD risk based off of her previous lipid panel is only 1.4%. -Patient will follow up with her PCP on 3/23 -See how she is tolerating Crestor -Recheck patient's lipid panel at 3/23 visit

## 2020-12-30 NOTE — Assessment & Plan Note (Addendum)
Patient previously noted to have anemia with hemoglobin at 9.4, MCV at 77. -Recommend PCP recheck CBC at 3/23 appointment

## 2020-12-30 NOTE — Patient Instructions (Addendum)
Thank you for coming in to see [name] today! Please see below to review our plan for today's visit:  1. Please take Crestor 10mg  every day to reduce your Bad Cholestorol (LDL). Follow up in 3 months for repeat Lipid panel.  2. Come back to see Dr. Vanessa [place] on the 23rd.    Please call the clinic at [phone] if your symptoms worsen or you have any concerns. It was our pleasure to serve you!   Dr. Milus Banister [facility]  Nonspecific Chest Pain Chest pain can be caused by many different conditions. Some causes of chest pain can be life-threatening. These will require treatment right away. Serious causes of chest pain include:  Heart attack.  A tear in the body's main blood vessel.  Redness and swelling (inflammation) around your heart.  Blood clot in your lungs. Other causes of chest pain may not be so serious. These include:  Heartburn.  Anxiety or stress.  Damage to bones or muscles in your chest.  Lung infections. Chest pain can feel like:  Pain or discomfort in your chest.  Crushing, pressure, aching, or squeezing pain.  Burning or tingling.  Dull or sharp pain that is worse when you move, cough, or take a deep breath.  Pain or discomfort that is also felt in your back, neck, jaw, shoulder, or arm, or pain that spreads to any of these areas. It is hard to know whether your pain is caused by something that is serious or something that is not so serious. So it is important to see your doctor right away if you have chest pain. Follow these instructions at home: Medicines  Take over-the-counter and prescription medicines only as told by your doctor.  If you were prescribed an antibiotic medicine, take it as told by your doctor. Do not stop taking the antibiotic even if you start to feel better. Lifestyle  Rest as told by your doctor.  Do not use any products that contain nicotine or tobacco, such as cigarettes, e-cigarettes, and chewing tobacco.  If you need help quitting, ask your doctor.  Do not drink alcohol.  Make lifestyle changes as told by your doctor. These may include: ? Getting regular exercise. Ask your doctor what activities are safe for you. ? Eating a heart-healthy diet. A diet and nutrition specialist (dietitian) can help you to learn healthy eating options. ? Staying at a healthy weight. ? Treating diabetes or high blood pressure, if needed. ? Lowering your stress. Activities such as yoga and relaxation techniques can help.   General instructions  Pay attention to any changes in your symptoms. Tell your doctor about them or any new symptoms.  Avoid any activities that cause chest pain.  Keep all follow-up visits as told by your doctor. This is important. You may need more testing if your chest pain does not go away. Contact a doctor if:  Your chest pain does not go away.  You feel depressed.  You have a fever. Get help right away if:  Your chest pain is worse.  You have a cough that gets worse, or you cough up blood.  You have very bad (severe) pain in your belly (abdomen).  You pass out (faint).  You have either of these for no clear reason: ? Sudden chest discomfort. ? Sudden discomfort in your arms, back, neck, or jaw.  You have shortness of breath at any time.  You suddenly start to sweat, or your skin gets clammy.  You  feel sick to your stomach (nauseous).  You throw up (vomit).  You suddenly feel lightheaded or dizzy.  You feel very weak or tired.  Your heart starts to beat fast, or it feels like it is skipping beats. These symptoms may be an emergency. Do not wait to see if the symptoms will go away. Get medical help right away. Call your local emergency services (911 in the [place] Do not drive yourself to the hospital. Summary  Chest pain can be caused by many different conditions. The cause may be serious and need treatment right away. If you have chest pain, see your doctor right  away.  Follow your doctor's instructions for taking medicines and making lifestyle changes.  Keep all follow-up visits as told by your doctor. This includes visits for any further testing if your chest pain does not go away.  Be sure to know the signs that show that your condition has become worse. Get help right away if you have these symptoms. This information is not intended to replace advice given to you by your health care provider. Make sure you discuss any questions you have with your health care provider. Document Revised: 04/10/2018 Document Reviewed: 04/10/2018 Elsevier Patient Education  2021 [employer].

## 2021-01-04 ENCOUNTER — Emergency Department (HOSPITAL_COMMUNITY)
Admission: EM | Admit: 2021-01-04 | Discharge: 2021-01-04 | Disposition: A | Discharge status: Home / Self Care | Payer: Medicare Other | Attending: Emergency Medicine | Admitting: Emergency Medicine

## 2021-01-04 ENCOUNTER — Emergency Department (HOSPITAL_COMMUNITY): Payer: Medicare Other

## 2021-01-04 ENCOUNTER — Other Ambulatory Visit: Payer: Self-pay

## 2021-01-04 ENCOUNTER — Encounter (HOSPITAL_COMMUNITY): Payer: Self-pay | Admitting: Emergency Medicine

## 2021-01-04 ENCOUNTER — Ambulatory Visit (INDEPENDENT_AMBULATORY_CARE_PROVIDER_SITE_OTHER): Payer: Medicare Other | Admitting: Family Medicine

## 2021-01-04 ENCOUNTER — Encounter: Payer: Self-pay | Admitting: Family Medicine

## 2021-01-04 ENCOUNTER — Telehealth: Payer: Self-pay

## 2021-01-04 VITALS — HR 110 | Wt 297.0 lb

## 2021-01-04 DIAGNOSIS — R21 Rash and other nonspecific skin eruption: Secondary | ICD-10-CM

## 2021-01-04 DIAGNOSIS — R0600 Dyspnea, unspecified: Secondary | ICD-10-CM | POA: Diagnosis not present

## 2021-01-04 DIAGNOSIS — R42 Dizziness and giddiness: Secondary | ICD-10-CM | POA: Diagnosis not present

## 2021-01-04 DIAGNOSIS — Z8616 Personal history of COVID-19: Secondary | ICD-10-CM | POA: Diagnosis not present

## 2021-01-04 DIAGNOSIS — R000 Tachycardia, unspecified: Secondary | ICD-10-CM | POA: Insufficient documentation

## 2021-01-04 DIAGNOSIS — Z20822 Contact with and (suspected) exposure to covid-19: Secondary | ICD-10-CM | POA: Insufficient documentation

## 2021-01-04 DIAGNOSIS — Z87891 Personal history of nicotine dependence: Secondary | ICD-10-CM | POA: Diagnosis not present

## 2021-01-04 DIAGNOSIS — Z79899 Other long term (current) drug therapy: Secondary | ICD-10-CM | POA: Diagnosis not present

## 2021-01-04 DIAGNOSIS — J441 Chronic obstructive pulmonary disease with (acute) exacerbation: Secondary | ICD-10-CM | POA: Diagnosis not present

## 2021-01-04 DIAGNOSIS — Z7951 Long term (current) use of inhaled steroids: Secondary | ICD-10-CM | POA: Diagnosis not present

## 2021-01-04 DIAGNOSIS — Z853 Personal history of malignant neoplasm of breast: Secondary | ICD-10-CM | POA: Diagnosis not present

## 2021-01-04 DIAGNOSIS — R0602 Shortness of breath: Secondary | ICD-10-CM | POA: Diagnosis not present

## 2021-01-04 DIAGNOSIS — E119 Type 2 diabetes mellitus without complications: Secondary | ICD-10-CM | POA: Diagnosis not present

## 2021-01-04 DIAGNOSIS — R0902 Hypoxemia: Secondary | ICD-10-CM | POA: Diagnosis not present

## 2021-01-04 LAB — CBC
HCT: 36.1 % (ref 36.0–46.0)
Hemoglobin: 10.8 g/dL — ABNORMAL LOW (ref 12.0–15.0)
MCH: 23.2 pg — ABNORMAL LOW (ref 26.0–34.0)
MCHC: 29.9 g/dL — ABNORMAL LOW (ref 30.0–36.0)
MCV: 77.6 fL — ABNORMAL LOW (ref 80.0–100.0)
Platelets: 214 10*3/uL (ref 150–400)
RBC: 4.65 MIL/uL (ref 3.87–5.11)
RDW: 21.4 % — ABNORMAL HIGH (ref 11.5–15.5)
WBC: 4.5 10*3/uL (ref 4.0–10.5)
nRBC: 0 % (ref 0.0–0.2)

## 2021-01-04 LAB — BASIC METABOLIC PANEL
Anion gap: 10 (ref 5–15)
BUN: <5 mg/dL — ABNORMAL LOW (ref 6–20)
CO2: 23 mmol/L (ref 22–32)
Calcium: 8.9 mg/dL (ref 8.9–10.3)
Chloride: 101 mmol/L (ref 98–111)
Creatinine, Ser: 1.01 mg/dL — ABNORMAL HIGH (ref 0.44–1.00)
GFR, Estimated: >60 mL/min (ref >60)
Glucose, Bld: 91 mg/dL (ref 70–99)
Potassium: 3.8 mmol/L (ref 3.5–5.1)
Sodium: 134 mmol/L — ABNORMAL LOW (ref 135–145)

## 2021-01-04 LAB — POC SARS CORONAVIRUS 2 AG -  ED: SARS Coronavirus 2 Ag: NEGATIVE

## 2021-01-04 MED ORDER — CETIRIZINE HCL 10 MG PO TABS: 10.0000 mg | ORAL_TABLET | Freq: Every day | ORAL | 11 refills | Status: DC | PRN

## 2021-01-04 MED ORDER — AZITHROMYCIN 250 MG PO TABS: 250.0000 mg | ORAL_TABLET | Freq: Every day | ORAL | 0 refills | Status: DC

## 2021-01-04 MED ORDER — IPRATROPIUM-ALBUTEROL 0.5-2.5 (3) MG/3ML IN SOLN
3.0000 mL | Freq: Once | RESPIRATORY_TRACT | Status: AC
  Administered 2021-01-04: 3 mL via RESPIRATORY_TRACT
  Filled 2021-01-04: qty 3

## 2021-01-04 MED ORDER — MAGNESIUM SULFATE 2 GM/50ML IV SOLN
2.0000 g | Freq: Once | INTRAVENOUS | Status: AC
  Administered 2021-01-04: 2 g via INTRAVENOUS
  Filled 2021-01-04: qty 50

## 2021-01-04 MED ORDER — ALBUTEROL SULFATE HFA 108 (90 BASE) MCG/ACT IN AERS
1.0000 | INHALATION_SPRAY | Freq: Once | RESPIRATORY_TRACT | Status: AC
  Administered 2021-01-04: 2 via RESPIRATORY_TRACT
  Filled 2021-01-04: qty 6.7

## 2021-01-04 MED ORDER — METHYLPREDNISOLONE SODIUM SUCC 125 MG IJ SOLR
125.0000 mg | Freq: Once | INTRAMUSCULAR | Status: AC
  Administered 2021-01-04: 125 mg via INTRAMUSCULAR

## 2021-01-04 MED ORDER — PREDNISONE 10 MG (21) PO TBPK: ORAL_TABLET | Freq: Every day | ORAL | 0 refills | Status: DC

## 2021-01-04 NOTE — Discharge Instructions (Addendum)
Take antibiotics as directed. Please take all of your antibiotics until finished.  Use your inhalers as directed.  Take steroids as directed.   Follow up with your primary care doctor.   Return to the Emergency Dept for any worsening difficulty breathing, chest pain, vomiting or any other worsening or concerning symptoms.

## 2021-01-04 NOTE — ED Provider Notes (Signed)
[place] EMERGENCY DEPARTMENT Provider Note   CSN: [id number] Arrival date & time: 01/04/21  1149     History Chief Complaint  Patient presents with  . Shortness of Breath    Kimberly [name] is a 50 y.o. female possible history of bipolar, COPD who presents for evaluation of shortness of breath, cough.  She states that she has had about 3 to 4 days of increased coughing, dyspnea.  She has a nebulizer at home but states she has not been able to use it.  She has been using her inhalers.  Today, she felt like the symptoms were getting worse so she went to the [facility] medicine center.  While there, she was in acute distress speaking in 1-2 word sentences.  There is wheezing noted throughout all lung fields.  She was maintaining sats between 93-97.  She was placed on 2 L of oxygen for comfort care.  She was given Solu-Medrol, duo nebs and sent to the emergency department for further evaluation.  She does feel like since being here in the ED, her breathing has improved slightly.  She states she has had increasing cough production and states she is coughing up phlegm.  She states that she was bitten by some bugs about a week ago and had some areas to her anterior aspects of her legs that were erythematous, itching.  She states she started having a fever in the last 3 to 4 days that she attributed to infection of the skin from the bug bites.  She states that the fever was up to 101.  She has not been vaccinated for COVID.  She does state that her children attend school and so she did at home COVID test for all of them and they were negative.  She denies any chest pain, abdominal pain, nausea/vomiting.  She has never had to been intubated for her COPD.  She has been on BiPAP before.  The history is provided by the patient.       Past Medical History:  Diagnosis Date  . Acute cholecystitis 04/17/2019  . Anemia   . Arthritis   . Bipolar 1 disorder ([place])   . Blood transfusion  without reported diagnosis 2007   post bleeding childbirth  . Cancer ([place])    eval  lt breast cancer  . COPD (chronic obstructive pulmonary disease) ([place])   . COVID-19 virus infection 06/07/2019  . Diabetes mellitus without complication ([place])    gestational  . Hand injury, right, initial encounter 03/19/2019  . Heart murmur    as a child-not adult-no cardiac work up  . History of radiation therapy 08/31/14-10/21/14   left breast/ left supraclavicular 50.4 Gy 28 fx, lef tposterior axillary boost 9.52 Gy 28 fx, left rbeast boost/ 10 Gy 5 fx  . Personal history of chemotherapy   . Personal history of radiation therapy     Patient Active Problem List   Diagnosis Date Noted  . Elevated LDL cholesterol level 12/30/2020  . Rash and nonspecific skin eruption 12/27/2020  . Noncardiac chest pain 12/27/2020  . No-show for appointment 10/13/2020  . COPD exacerbation ([place]) 08/24/2020  . Otitis media 08/10/2020  . Otitis externa of left ear 08/02/2020  . Oligouria 03/10/2020  . History of breast cancer 02/11/2020  . Total body pain 01/13/2019  . Postmenopausal bleeding 10/25/2017  . Chronic pain of both knees 05/03/2017  . Upper airway cough syndrome 03/27/2017  . Discogenic low back pain 03/20/2017  . COPD (chronic  obstructive pulmonary disease) ([place]) 03/20/2017  . Tachycardia   . Dyspnea on exertion   . Grieving 11/21/2016  . Mixed incontinence 09/29/2015  . Genetic testing 08/26/2015  . Malignant neoplasm of upper-outer quadrant of left breast in female, estrogen receptor positive ([place]) 05/07/2014  . Disorder of bladder 09/29/2008  . LOW BACK PAIN, CHRONIC 09/29/2008  . Morbid (severe) obesity due to excess calories ([place]) 11/19/2007  . ANEMIA, IRON DEFICIENCY, CHRONIC 11/14/2007  . Mixed bipolar I disorder ([place]) 02/20/2007  . Tobacco use disorder 02/20/2007    Past Surgical History:  Procedure Laterality Date  . BREAST BIOPSY Left 04/01/14  . BREAST BIOPSY Right 04/19/14  .  BREAST LUMPECTOMY WITH AXILLARY LYMPH NODE DISSECTION Bilateral 05/03/14  . CHOLECYSTECTOMY N/A 04/18/2019   Procedure: LAPAROSCOPIC CHOLECYSTECTOMY WITH INTRAOPERATIVE CHOLANGIOGRAM;  Surgeon: Jovita Kussmaul, MD;  Location: [facility];  Service: General;  Laterality: N/A;  . DILATION AND CURETTAGE OF UTERUS     after childbirth  . MULTIPLE TOOTH EXTRACTIONS     only 5 left  . PORTACATH PLACEMENT Right 06/18/2014   Procedure: INSERTION PORT-A-CATH;  Surgeon: [name], MD;  Location: [facility];  Service: General;  Laterality: Right;  . TONSILLECTOMY AND ADENOIDECTOMY       OB History   No obstetric history on file.     Family History  Problem Relation Age of Onset  . Heart disease Father   . Cancer Father        Prostate  . Colon cancer Maternal Grandfather   . Colon cancer Paternal Grandmother   . Stomach cancer Neg Hx   . Esophageal cancer Neg Hx     Social History   Tobacco Use  . Smoking status: Former Smoker    Packs/day: 1.00    Years: 32.00    Pack years: 32.00    Types: Cigarettes    Quit date: 01/08/2019    Years since quitting: 1.9  . Smokeless tobacco: Never Used  Vaping Use  . Vaping Use: Never used  Substance Use Topics  . Alcohol use: No  . Drug use: No    Home Medications Prior to Admission medications   Medication Sig Start Date End Date Taking? Authorizing Provider  acetaminophen (TYLENOL) 325 MG tablet Take 2 tablets (650 mg total) by mouth every 6 (six) hours as needed. Patient taking differently: Take 650 mg by mouth every 6 (six) hours as needed for mild pain.  07/15/20   Darr, Edison Nasuti, [name]  albuterol (PROVENTIL HFA;VENTOLIN HFA) 108 (90 Base) MCG/ACT inhaler Inhale 2 puffs into the lungs every 6 (six) hours as needed for wheezing or shortness of breath. Patient not taking: Reported on 04/17/2019 02/25/17   [name], DO  azithromycin (ZITHROMAX) 250 MG tablet 1 tab po daily x 4 more days, zero refills 08/25/20   Donne Hazel, MD  busPIRone (BUSPAR) 15 MG tablet Take 15 mg by mouth at bedtime. 10/27/18   [provider]  cetirizine (ZYRTEC) 10 MG tablet Take 1 tablet (10 mg total) by mouth daily as needed for allergies (and itching). 12/27/20   Meccariello, Bernita Raisin, DO  doxepin (SINEQUAN) 25 MG capsule Take 25 mg by mouth at bedtime.    [provider]  hydrocortisone 2.5 % ointment Apply topically 2 (two) times daily. 12/27/20   Meccariello, Bernita Raisin, DO  ipratropium-albuterol (DUONEB) 0.5-2.5 (3) MG/3ML SOLN Take 3 mLs by nebulization every 4 (four) hours as needed. Patient not taking: Reported on 08/24/2020  06/09/19   Myles Gip, DO  mometasone-formoterol (DULERA) 200-5 MCG/ACT AERO Inhale 2 puffs into the lungs 2 (two) times daily. 08/25/20   Donne Hazel, MD  prazosin (MINIPRESS) 5 MG capsule Take 5 mg by mouth at bedtime.    [provider]  QUEtiapine (SEROQUEL) 400 MG tablet Take 400 mg by mouth at bedtime.  11/29/19   [provider]  rosuvastatin (CRESTOR) 10 MG tablet Take 1 tablet (10 mg total) by mouth daily. 12/30/20   Daisy Floro, DO    Allergies    Patient has no known allergies.  Review of Systems   Review of Systems  Constitutional: Positive for fever.  Respiratory: Positive for cough and shortness of breath.   Cardiovascular: Negative for chest pain.  Gastrointestinal: Negative for abdominal pain, nausea and vomiting.  Genitourinary: Negative for dysuria and hematuria.  Skin: Positive for color change.  Neurological: Negative for headaches.  All other systems reviewed and are negative.   Physical Exam Updated Vital Signs BP 121/84   Pulse 97   Temp 98.5 F (36.9 C)   Resp 17   SpO2 98%   Physical Exam Vitals and nursing note reviewed.  HENT:     Head: Normocephalic and atraumatic.  Eyes:     General: Lids are normal.     Conjunctiva/sclera: Conjunctivae normal.     Pupils: Pupils are equal, round, and reactive to light.   Cardiovascular:     Rate and Rhythm: Normal rate and regular rhythm.     Pulses: Normal pulses.     Heart sounds: Normal heart sounds. No murmur heard. No friction rub. No gallop.   Pulmonary:     Effort: Pulmonary effort is normal. Tachypnea present.     Comments: Diffuse rhonchi noted with intermittent wheezing.  She is able to speak in medium sentences. Abdominal:     Palpations: Abdomen is soft. Abdomen is not rigid.     Tenderness: There is no abdominal tenderness. There is no guarding.  Musculoskeletal:        General: Normal range of motion.     Cervical back: Full passive range of motion without pain.  Skin:    General: Skin is warm and dry.     Capillary Refill: Capillary refill takes less than 2 seconds.     Comments: She is a patch that is about 5 x 2 cm in total area noted to the anterior aspect of the left lower leg with small pinpoint scab wounds and some mild excoriations.  There is some mild erythema but no overlying warmth, edema.  Neurological:     Mental Status: She is alert and oriented to person, place, and time.  Psychiatric:        Speech: Speech normal.     ED Results / Procedures / Treatments   Labs (all labs ordered are listed, but only abnormal results are displayed) Labs Reviewed  BASIC METABOLIC PANEL - Abnormal; Notable for the following components:      Result Value   Sodium 134 (*)    BUN <5 (*)    Creatinine, Ser 1.01 (*)    All other components within normal limits  CBC - Abnormal; Notable for the following components:   Hemoglobin 10.8 (*)    MCV 77.6 (*)    MCH 23.2 (*)    MCHC 29.9 (*)    RDW 21.4 (*)    All other components within normal limits  SARS CORONAVIRUS 2 (TAT 6-24 HRS)  POC SARS CORONAVIRUS 2 AG -  ED    EKG None  Radiology DG Chest 2 View  Result Date: 01/04/2021 CLINICAL DATA:  Dyspnea for 3 days with lower extremity erythema, history of breast cancer EXAM: CHEST - 2 VIEW COMPARISON:  08/23/2020 chest radiograph.  FINDINGS: Right subclavian Port-A-Cath terminates over the middle third of the SVC. Stable cardiomediastinal silhouette with normal heart size. No pneumothorax. No pleural effusion. Lungs appear clear, with no acute consolidative airspace disease and no pulmonary edema. Left axillary surgical clips again noted. IMPRESSION: No active cardiopulmonary disease. Electronically Signed   By: Ilona Sorrel M.D.   On: 01/04/2021 12:32    Procedures Procedures   Medications Ordered in ED Medications  magnesium sulfate IVPB 2 g 50 mL (has no administration in time range)  ipratropium-albuterol (DUONEB) 0.5-2.5 (3) MG/3ML nebulizer solution 3 mL (has no administration in time range)  albuterol (VENTOLIN HFA) 108 (90 Base) MCG/ACT inhaler 1-2 puff (2 puffs Inhalation Given 01/04/21 1422)    ED Course  I have reviewed the triage vital signs and the nursing notes.  Pertinent labs & imaging results that were available during my care of the patient were reviewed by me and considered in my medical decision making (see chart for details).    MDM Rules/Calculators/A&P                          50 year old female past medical see of COPD who presents for evaluation of difficulty breathing, cough.  She reports that this been ongoing for last 3 to 4 days.  Presented to the [facility] family medicine center today and was in acute distress.  She was given DuoNeb, Solu-Medrol and sent to the ED.  She has not been vaccinated for COVID.  She states she has had fever at home but attributes this to being bitten by insects on her leg.  She reports that this is actually gotten better.  Initially arrival, she is afebrile, slightly tachycardic and tachypneic.  She is on 2 L and maintaining under percent oxygenation.  I turned her off and she was maintaining at about 98%.  She has diffuse rhonchi with some mild wheezing.  Will obtain chest x-ray and labs.  Concern for COPD exacerbation versus infectious process versus Covid.  Chest  x-ray shows no acute abnormality.  CBC shows hemoglobin 10.8.  BMP shows BUN less than 5, creatinine of 1.10.  We attempted to ambulate patient while evaluating her O2 sats.  Patient still feels very winded, short of breath and does not feel like she can walk.  On my evaluation, she still very rhonchorous with some wheezing noted.  We will plan to give magnesium to see if that helps.  Patient signed out to oncoming provider pending re-evaluation.   Portions of this note were generated with [employer]. Dictation errors may occur despite best attempts at proofreading.   Final Clinical Impression(s) / ED Diagnoses Final diagnoses:  COPD exacerbation [facility])    Rx / DC Orders ED Discharge Orders    None       Desma Mcgregor 01/04/21 1557    Elnora Morrison, MD 01/05/21 1502

## 2021-01-04 NOTE — Progress Notes (Signed)
    SUBJECTIVE:   CHIEF COMPLAINT / HPI: Dyspnea  Patient came in today for difficulty breathing.  Usually uses Nebulizer at home nightly but reports dog chewed hole in tube so was not able to use Nebulizer.  When entering in room patient very distressed and indicates having very difficult breathing.  Only able to speak in one word phrases.  Also had redness of skin for last few days on arms and leg.  Also has run out of inhalers.  Not on O2 at home.  PERTINENT  PMH / PSH: COPD  OBJECTIVE:   Pulse (!) 110   Wt 297 lb (134.7 kg)   SpO2 96%   BMI 45.16 kg/m    T-98.5  Physical Exam Constitutional:      General: She is in acute distress.     Appearance: She is not toxic-appearing.  HENT:     Head: Normocephalic and atraumatic.  Cardiovascular:     Rate and Rhythm: Regular rhythm. Tachycardia present.     Pulses: Normal pulses.  Pulmonary:     Effort: Respiratory distress present.     Breath sounds: Wheezing present.     Comments: Diffuse wheezes throughout lungs Neurological:     Mental Status: She is alert.     ASSESSMENT/PLAN:   COPD (chronic obstructive pulmonary disease) ([place]) Patient has been gradually worsening of breathing since last night, now in severe distress.  O2 sats between 93-97%. - Called EMS to take patient to ED - Given Duoneb treatment - Given Solumedrol 125 mg injection    Delora Fuel, MD [place]

## 2021-01-04 NOTE — Telephone Encounter (Signed)
Noted and agreed. Thank you Jarrett Soho.

## 2021-01-04 NOTE — Assessment & Plan Note (Addendum)
Patient has been gradually worsening of breathing since last night, now in severe distress.  O2 sats between 93-97%. - Called EMS to take patient to ED - Given Duoneb treatment - Given Solumedrol 125 mg injection

## 2021-01-04 NOTE — ED Provider Notes (Signed)
Accepted handoff at shift change from [place] L. [name]. Please see prior provider note for more detail.   Briefly: Patient is 50 y.o.   "Kimberly [name] is a 50 y.o. female possible history of bipolar, COPD who presents for evaluation of shortness of breath, cough.  She states that she has had about 3 to 4 days of increased coughing, dyspnea.  She has a nebulizer at home but states she has not been able to use it.  She has been using her inhalers.  Today, she felt like the symptoms were getting worse so she went to the [facility] family medicine center.  While there, she was in acute distress speaking in 1-2 word sentences.  There is wheezing noted throughout all lung fields.  She was maintaining sats between 93-97.  She was placed on 2 L of oxygen for comfort care.  She was given Solu-Medrol, duo nebs and sent to the emergency department for further evaluation.  She does feel like since being here in the ED, her breathing has improved slightly.  She states she has had increasing cough production and states she is coughing up phlegm.  She states that she was bitten by some bugs about a week ago and had some areas to her anterior aspects of her legs that were erythematous, itching.  She states she started having a fever in the last 3 to 4 days that she attributed to infection of the skin from the bug bites.  She states that the fever was up to 101.  She has not been vaccinated for COVID.  She does state that her children attend school and so she did at home COVID test for all of them and they were negative.  She denies any chest pain, abdominal pain, nausea/vomiting.  She has never had to been intubated for her COPD.  She has been on BiPAP before."  Plan: Follow up on pt     Physical Exam  BP 121/84   Pulse 97   Temp 98.5 F (36.9 C)   Resp 17   SpO2 98%   Physical Exam Vitals and nursing note reviewed.  Constitutional:      General: She is not in acute distress. HENT:     Head: Normocephalic and  atraumatic.     Nose: Nose normal.  Eyes:     General: No scleral icterus. Cardiovascular:     Rate and Rhythm: Normal rate and regular rhythm.     Pulses: Normal pulses.     Heart sounds: Normal heart sounds.  Pulmonary:     Effort: No respiratory distress.     Breath sounds: No wheezing.     Comments: Coarse lung sounds w faint rhonchi  Tachypnea has resolved No wheezing, good air movement.   Patient is able to speak in full sentences and ambulates without dyspnea under my supervision. Abdominal:     Palpations: Abdomen is soft.     Tenderness: There is no abdominal tenderness.  Musculoskeletal:     Cervical back: Normal range of motion.     Right lower leg: No edema.     Left lower leg: No edema.  Skin:    General: Skin is warm and dry.     Capillary Refill: Capillary refill takes less than 2 seconds.  Neurological:     Mental Status: She is alert. Mental status is at baseline.  Psychiatric:        Mood and Affect: Mood normal.        Behavior:  Behavior normal.       ED Course/Procedures     Procedures   Results for orders placed or performed during the hospital encounter of 24/40/10  Basic metabolic panel  Result Value Ref Range   Sodium 134 (L) 135 - 145 mmol/L   Potassium 3.8 3.5 - 5.1 mmol/L   Chloride 101 98 - 111 mmol/L   CO2 23 22 - 32 mmol/L   Glucose, Bld 91 70 - 99 mg/dL   BUN <5 (L) 6 - 20 mg/dL   Creatinine, Ser 1.01 (H) 0.44 - 1.00 mg/dL   Calcium 8.9 8.9 - 10.3 mg/dL   GFR, Estimated >60 >60 mL/min   Anion gap 10 5 - 15  CBC  Result Value Ref Range   WBC 4.5 4.0 - 10.5 K/uL   RBC 4.65 3.87 - 5.11 MIL/uL   Hemoglobin 10.8 (L) 12.0 - 15.0 g/dL   HCT 36.1 36.0 - 46.0 %   MCV 77.6 (L) 80.0 - 100.0 fL   MCH 23.2 (L) 26.0 - 34.0 pg   MCHC 29.9 (L) 30.0 - 36.0 g/dL   RDW 21.4 (H) 11.5 - 15.5 %   Platelets 214 150 - 400 K/uL   nRBC 0.0 0.0 - 0.2 %  POC SARS Coronavirus 2 Ag-ED - Nasal Swab (BD Veritor Kit)  Result Value Ref Range   SARS  Coronavirus 2 Ag NEGATIVE NEGATIVE   DG Chest 2 View  Result Date: 01/04/2021 CLINICAL DATA:  Dyspnea for 3 days with lower extremity erythema, history of breast cancer EXAM: CHEST - 2 VIEW COMPARISON:  08/23/2020 chest radiograph. FINDINGS: Right subclavian Port-A-Cath terminates over the middle third of the SVC. Stable cardiomediastinal silhouette with normal heart size. No pneumothorax. No pleural effusion. Lungs appear clear, with no acute consolidative airspace disease and no pulmonary edema. Left axillary surgical clips again noted. IMPRESSION: No active cardiopulmonary disease. Electronically Signed   By: Ilona Sorrel M.D.   On: 01/04/2021 12:32     MDM  Patient seen urgently by Layne Benton.  Reassessed by myself after magnesium, nebulizer albuterol.  Her respiratory status is significantly improved from prior.  She is no longer tachypneic and wheezing has improved.  She states that she feels well.  I did offer patient admission however she declined.  On my reassessment patient continues to breathe without difficulty.  Ambulates without desaturation and without dyspnea.  Patient's rapid antigen test was negative.  I ordered a send out 6-24-hour Covid PCR.  She will follow up with the [facility].  Discharged with azithromycin, prednisone and albuterol inhaler.  Given return precautions.    Tedd Sias, [place] 01/04/21 1752    Drenda Freeze, MD 01/06/21 [phone]

## 2021-01-04 NOTE — ED Triage Notes (Signed)
Patient BIB [facility] from PCP for evaluation of COPD exacerbation. Does not wear supplemental O2 at baseline, wearing 2L [place] with SpO2 96%. Received 1x duoneb and 125mg  solumedrol. 20g in left hand. Patent alert, oriented, and in no apparent distress at this time.

## 2021-01-04 NOTE — Telephone Encounter (Signed)
Patient calls nurse line regarding concerns with intermittent SHOB since yesterday. Patient describes [facility] as beginning of a COPD exacerbation. Patient is able to speak in complete sentences and is not currently having SHOB.  Patient also reports that she has bug bites that may be infected on arms and legs. Patient states that on both legs there are mass like appearances that are painful and warm to the touch.   Patient reports taking benadryl yesterday and that this helped with symptoms. Precepted with Dr. Owens Shark. Advised that patient could be seen in clinic today. Scheduled same day appointment with Dr. Manus Rudd.   Talbot Grumbling, RN

## 2021-01-06 ENCOUNTER — Ambulatory Visit: Payer: Medicare Other

## 2021-01-11 ENCOUNTER — Other Ambulatory Visit: Payer: Self-pay

## 2021-01-11 ENCOUNTER — Ambulatory Visit (INDEPENDENT_AMBULATORY_CARE_PROVIDER_SITE_OTHER): Payer: Medicare Other | Admitting: Family Medicine

## 2021-01-11 ENCOUNTER — Encounter: Payer: Self-pay | Admitting: Family Medicine

## 2021-01-11 DIAGNOSIS — J449 Chronic obstructive pulmonary disease, unspecified: Secondary | ICD-10-CM | POA: Diagnosis not present

## 2021-01-11 MED ORDER — IPRATROPIUM-ALBUTEROL 0.5-2.5 (3) MG/3ML IN SOLN: 3.0000 mL | RESPIRATORY_TRACT | 3 refills | Status: DC | PRN

## 2021-01-11 MED ORDER — ALBUTEROL SULFATE HFA 108 (90 BASE) MCG/ACT IN AERS: 2.0000 | INHALATION_SPRAY | Freq: Four times a day (QID) | RESPIRATORY_TRACT | 1 refills | Status: DC | PRN

## 2021-01-11 MED ORDER — MOMETASONE FURO-FORMOTEROL FUM 200-5 MCG/ACT IN AERO: 2.0000 | INHALATION_SPRAY | Freq: Two times a day (BID) | RESPIRATORY_TRACT | Status: DC

## 2021-01-11 NOTE — Progress Notes (Signed)
    SUBJECTIVE:   CHIEF COMPLAINT / HPI:   Follow-up-COPD exacerbation: Patient is a 50 year old female that presents today for follow-up from COPD exacerbation for which she went to the emergency department on 01/04/2021.  The emergency department she was given duo nebs, Solu-Medrol and maintained on 2 L O2.  Lately emergency department visit they did turn down her oxygen to room air she was satting 98% at that time.  Chest x-ray with no acute abnormality.  Patient was discharged on azithromycin, prednisone, and albuterol inhaler.  She was found to be COVID-19 negative.  Today she states she does feel shortness of breath from time to time but does not feel any chest pains at this time.  She states that the primary reason she is here is because her nebulizer machine broke and she is in need of another 1 to make sure she can stay ahead of any of her symptoms.  PERTINENT  PMH / PSH: History of COPD  OBJECTIVE:   BP 128/80   Pulse (!) 102   Ht 5\' 8"  (1.727 m)   Wt 290 lb 8 oz (131.8 kg)   SpO2 98%   BMI 44.17 kg/m    Pulse recheck 96  General: NAD, pleasant, able to participate in exam Cardiac: RRR, no murmurs. Respiratory: Patient does have end-expiratory wheezing present and some fine crackles present in the bases but no respiratory distress on room air with O2 sat of 98% Extremities: No pitting edema of the bilateral lower extremities Psych: Normal affect and mood  ASSESSMENT/PLAN:   COPD (chronic obstructive pulmonary disease) ([facility]) Assessment: 50 year old female with a history of COPD who presents today because her nebulizer machine broke and she would like a replacement.  We did provide her with a replacement from our clinic.  I did also provide her refills.  On physical exam she does have some end-expiratory wheezing and some fine crackles present in bilateral bases but is satting well on room air with O2 sat of 98%. Plan: -Provided nebulizer machine as above -Refill patient's  medication -Recommended her follow-up in 3 months to ensure that she is doing well from a COPD standpoint -Provide return precautions should she develop any concerning symptoms in the meantime.     Lurline Del, [place]    This note was prepared using Dragon voice recognition software and may include unintentional dictation errors due to the inherent limitations of voice recognition software.

## 2021-01-11 NOTE — Patient Instructions (Signed)
It was great to see you! Thank you for allowing me to participate in your care!  I recommend that you always bring your medications to each appointment as this makes it easy to ensure we are on the correct medications and helps [name] not miss when refills are needed.  Our plans for today:  -We provided you a nebulizer machine since your previous one was broken.  I have also provided refills of your inhalers and nebulizer medication. -If you develop any trouble breathing, chest pains, fevers, or other concerning symptoms please not hesitate to return or go to the emergency department -I would like to see you back in about 3 months to make sure you are still doing well, of course feel free to come back sooner if you have other concerns.   Take care and seek immediate care sooner if you develop any concerns.   Dr. Lurline Del, [place]

## 2021-01-11 NOTE — Assessment & Plan Note (Signed)
Assessment: 50 year old female with a history of COPD who presents today because her nebulizer machine broke and she would like a replacement.  We did provide her with a replacement from our clinic.  I did also provide her refills.  On physical exam she does have some end-expiratory wheezing and some fine crackles present in bilateral bases but is satting well on room air with O2 sat of 98%. Plan: -Provided nebulizer machine as above -Refill patient's medication -Recommended her follow-up in 3 months to ensure that she is doing well from a COPD standpoint -Provide return precautions should she develop any concerning symptoms in the meantime.

## 2021-01-20 ENCOUNTER — Encounter (HOSPITAL_COMMUNITY): Payer: Self-pay

## 2021-01-20 ENCOUNTER — Other Ambulatory Visit: Payer: Self-pay

## 2021-01-20 ENCOUNTER — Ambulatory Visit (HOSPITAL_COMMUNITY)
Admission: EM | Admit: 2021-01-20 | Discharge: 2021-01-20 | Disposition: A | Discharge status: Home / Self Care | Payer: Medicare Other | Attending: Emergency Medicine | Admitting: Emergency Medicine

## 2021-01-20 DIAGNOSIS — Z20822 Contact with and (suspected) exposure to covid-19: Secondary | ICD-10-CM

## 2021-01-20 DIAGNOSIS — J441 Chronic obstructive pulmonary disease with (acute) exacerbation: Secondary | ICD-10-CM | POA: Diagnosis not present

## 2021-01-20 DIAGNOSIS — J0140 Acute pansinusitis, unspecified: Secondary | ICD-10-CM | POA: Diagnosis not present

## 2021-01-20 DIAGNOSIS — B370 Candidal stomatitis: Secondary | ICD-10-CM | POA: Insufficient documentation

## 2021-01-20 LAB — POC INFLUENZA A AND B ANTIGEN (URGENT CARE ONLY)
Influenza A Ag: NEGATIVE
Influenza B Ag: NEGATIVE

## 2021-01-20 MED ORDER — FLUTICASONE PROPIONATE 50 MCG/ACT NA SUSP: 2.0000 | Freq: Every day | NASAL | 0 refills | Status: DC

## 2021-01-20 MED ORDER — AEROCHAMBER PLUS MISC: 2 refills | Status: DC

## 2021-01-20 MED ORDER — NYSTATIN 100000 UNIT/ML MT SUSP: 500000.0000 [IU] | Freq: Four times a day (QID) | OROMUCOSAL | 0 refills | Status: DC

## 2021-01-20 MED ORDER — AMOXICILLIN-POT CLAVULANATE 875-125 MG PO TABS: 1.0000 | ORAL_TABLET | Freq: Two times a day (BID) | ORAL | 0 refills | Status: AC

## 2021-01-20 MED ORDER — IBUPROFEN 600 MG PO TABS
600.0000 mg | ORAL_TABLET | Freq: Four times a day (QID) | ORAL | 0 refills | Status: DC | PRN
Start: 2021-01-20 — End: 2021-07-12

## 2021-01-20 MED ORDER — PREDNISONE 20 MG PO TABS: 40.0000 mg | ORAL_TABLET | Freq: Every day | ORAL | 0 refills | Status: DC

## 2021-01-20 MED ORDER — BENZONATATE 200 MG PO CAPS: 200.0000 mg | ORAL_CAPSULE | Freq: Three times a day (TID) | ORAL | 0 refills | Status: DC | PRN

## 2021-01-20 NOTE — ED Triage Notes (Signed)
Pt in with c/o COPD exacerbation  Pt states she is having a ST, cough and nasal congestion that has been going on for 2 weeks   States she was recently treated for URI

## 2021-01-20 NOTE — Discharge Instructions (Addendum)
Your Covid test was back in 6 to 24 hours.  Your flu test was negative.  I have ordered a referral to pulmonology.  Please follow-up with the [facility] [facility] for your ongoing symptoms.  Use your spacer with all of your inhalers.  Nystatin swish and swallow for the burning mouth.  I suspect that this is thrush from not rinsing after using your inhaled steroid.  Augmentin for sinusitis and COPD exacerbation.  Prednisone, Tessalon for the cough, 600 mg of ibuprofen combined with 1000 mg of Tylenol 3-4 times a day as needed for pain.  Saline nasal irrigation with a Milta Deiters Med rinse and distilled water as often as you want and Flonase for your sinusitis.

## 2021-01-20 NOTE — ED Provider Notes (Signed)
HPI  SUBJECTIVE:  Kimberly [name] is a 50 y.o. female who presents with persistent symptoms of fatigue, dyspnea on exertion since having COVID in November 21.  The dyspnea on exertion has not changed recently.  What is new today is that she has nasal congestion, clear rhinorrhea, cough productive of reddish-green material.  She is reporting an increased amount in her sputum.  She reports body aches, fevers to 102 last night.  She reports sinus pain or pressure and wheezing.  No facial swelling, upper dental pain.  No chest pain.  She also states that she has a burning sore throat, sore tongue and uvula.  She uses an inhaled steroid and does not rinse after using it.  She has not noticed a white coating on her tongue or on her cheeks.  No neck stiffness, drooling, trismus, muffled voice.  She has been taking Aleve, and is using her albuterol inhaler 2-3 times a day, which is more than usual.  She states that she is compliant with her Dulera and duo nebs as prescribed.  Aleve and the albuterol.  Symptoms are worse with going outside and with exertion.  She did not get the COVID or flu vaccine.  No known Covid or flu exposure.  She has a past medical history of breast cancer 5 years ago, COPD, COVID November 21, tonsillectomy, hypercholesterolemia, allergies, frequent sinusitis.  No history of diabetes, hypertension, chronic kidney disease.  No intubations for her COPD although she has required BiPAP in the ED once.   Patient was seen in the ED on 3/16 for COPD exacerbation, required multiple albuterol nebs, Solu-Medrol, magnesium.  She was discharged home with azithromycin, prednisone and albuterol inhaler.  Followed up with PMD on 3/23, was doing well, given another nebulizer machine and given refills on her medications.  Patient states that she does not have a spacer for her inhalers.  PMD: [facility] Past Medical History:  Diagnosis Date  . Acute cholecystitis 04/17/2019  . Anemia   .  Arthritis   . Bipolar 1 disorder ([place])   . Blood transfusion without reported diagnosis 2007   post bleeding childbirth  . Cancer ([place])    eval  lt breast cancer  . COPD (chronic obstructive pulmonary disease) ([place])   . COVID-19 virus infection 06/07/2019  . Diabetes mellitus without complication ([place])    gestational  . Hand injury, right, initial encounter 03/19/2019  . Heart murmur    as a child-not adult-no cardiac work up  . History of radiation therapy 08/31/14-10/21/14   left breast/ left supraclavicular 50.4 Gy 28 fx, lef tposterior axillary boost 9.52 Gy 28 fx, left rbeast boost/ 10 Gy 5 fx  . Personal history of chemotherapy   . Personal history of radiation therapy     Past Surgical History:  Procedure Laterality Date  . BREAST BIOPSY Left 04/01/14  . BREAST BIOPSY Right 04/19/14  . BREAST LUMPECTOMY WITH AXILLARY LYMPH NODE DISSECTION Bilateral 05/03/14  . CHOLECYSTECTOMY N/A 04/18/2019   Procedure: LAPAROSCOPIC CHOLECYSTECTOMY WITH INTRAOPERATIVE CHOLANGIOGRAM;  Surgeon: Jovita Kussmaul, MD;  Location: [facility];  Service: General;  Laterality: N/A;  . DILATION AND CURETTAGE OF UTERUS     after childbirth  . MULTIPLE TOOTH EXTRACTIONS     only 5 left  . PORTACATH PLACEMENT Right 06/18/2014   Procedure: INSERTION PORT-A-CATH;  Surgeon: [name], MD;  Location: [facility];  Service: General;  Laterality: Right;  . TONSILLECTOMY AND ADENOIDECTOMY  Family History  Problem Relation Age of Onset  . Heart disease Father   . Cancer Father        Prostate  . Colon cancer Maternal Grandfather   . Colon cancer Paternal Grandmother   . Stomach cancer Neg Hx   . Esophageal cancer Neg Hx     Social History   Tobacco Use  . Smoking status: Former Smoker    Packs/day: 1.00    Years: 32.00    Pack years: 32.00    Types: Cigarettes    Quit date: 01/08/2019    Years since quitting: 2.0  . Smokeless tobacco: Never Used  Vaping Use  . Vaping Use: Never  used  Substance Use Topics  . Alcohol use: No  . Drug use: No    No current facility-administered medications for this encounter.  Current Outpatient Medications:  .  amoxicillin-clavulanate (AUGMENTIN) 875-125 MG tablet, Take 1 tablet by mouth 2 (two) times daily for 7 days., Disp: 14 tablet, Rfl: 0 .  benzonatate (TESSALON) 200 MG capsule, Take 1 capsule (200 mg total) by mouth 3 (three) times daily as needed for cough., Disp: 30 capsule, Rfl: 0 .  fluticasone (FLONASE) 50 MCG/ACT nasal spray, Place 2 sprays into both nostrils daily., Disp: 16 g, Rfl: 0 .  ibuprofen (ADVIL) 600 MG tablet, Take 1 tablet (600 mg total) by mouth every 6 (six) hours as needed., Disp: 30 tablet, Rfl: 0 .  nystatin (MYCOSTATIN) 100000 UNIT/ML suspension, Take 5 mLs (500,000 Units total) by mouth 4 (four) times daily. Swish and swallow x 7-14 days. Retain in mouth as long as possible, Disp: 280 mL, Rfl: 0 .  predniSONE (DELTASONE) 20 MG tablet, Take 2 tablets (40 mg total) by mouth daily with breakfast for 5 days., Disp: 10 tablet, Rfl: 0 .  Spacer/Aero-Holding Chambers (AEROCHAMBER PLUS) inhaler, Use with inhaler, Disp: 1 each, Rfl: 2 .  acetaminophen (TYLENOL) 325 MG tablet, Take 2 tablets (650 mg total) by mouth every 6 (six) hours as needed. (Patient taking differently: Take 650 mg by mouth every 6 (six) hours as needed for mild pain. ), Disp: 30 tablet, Rfl: 0 .  albuterol (VENTOLIN HFA) 108 (90 Base) MCG/ACT inhaler, Inhale 2 puffs into the lungs every 6 (six) hours as needed for wheezing or shortness of breath., Disp: 1 each, Rfl: 1 .  buPROPion (WELLBUTRIN XL) 150 MG 24 hr tablet, Take 150 mg by mouth daily., Disp: , Rfl:  .  busPIRone (BUSPAR) 15 MG tablet, Take 15 mg by mouth at bedtime., Disp: , Rfl:  .  cetirizine (ZYRTEC) 10 MG tablet, Take 1 tablet (10 mg total) by mouth daily as needed for allergies (and itching)., Disp: 30 tablet, Rfl: 11 .  doxepin (SINEQUAN) 25 MG capsule, Take 25 mg by mouth at  bedtime., Disp: , Rfl:  .  hydrocortisone 2.5 % ointment, Apply topically 2 (two) times daily., Disp: 30 g, Rfl: 0 .  ipratropium-albuterol (DUONEB) 0.5-2.5 (3) MG/3ML SOLN, Take 3 mLs by nebulization every 4 (four) hours as needed., Disp: 360 mL, Rfl: 3 .  mometasone-formoterol (DULERA) 200-5 MCG/ACT AERO, Inhale 2 puffs into the lungs 2 (two) times daily., Disp: 1 each, Rfl:  .  prazosin (MINIPRESS) 5 MG capsule, Take 5 mg by mouth at bedtime., Disp: , Rfl:  .  predniSONE (DELTASONE) 10 MG tablet, TAKE 4 TABLETS DAILY WITH BREAKFAST FOR 3 DAYS, THEN 2 TABS DAILY FOR 3 DAYS, THEN 1 TAB DAILY FOR 3 DAYS, THEN 1/2 TAB DAILY  FOR 2 DAYS., Disp: 22 tablet, Rfl: 0 .  QUEtiapine (SEROQUEL) 400 MG tablet, Take 400 mg by mouth at bedtime. , Disp: , Rfl:  .  rosuvastatin (CRESTOR) 10 MG tablet, Take 1 tablet (10 mg total) by mouth daily., Disp: 90 tablet, Rfl: 1  No Known Allergies   ROS  As noted in HPI.   Physical Exam  BP 120/83   Pulse (!) 107   Temp 97.9 F (36.6 C)   Resp 20   SpO2 99%   Constitutional: Well developed, well nourished, no acute distress. Eyes:  EOMI, conjunctiva normal bilaterally HENT: Normocephalic, atraumatic,mucus membranes moist.  Extensive nasal congestion.  Positive maxillary, frontal sinus tenderness.  Erythematous, swollen turbinates.  Tonsils absent.  Uvula midline.  Uvula not swollen.  Tongue not swollen, normal appearance, no plaques.  No muffled voice, drooling, trismus. Neck: No cervical lymphadenopathy Respiratory: Normal inspiratory effort.  Lungs clear bilaterally.  Positive anterior and lateral chest wall tenderness. Cardiovascular: Regular tachycardia, no murmurs rubs or gallops. GI: nondistended skin: No rash, skin intact Musculoskeletal: no deformities Neurologic: Alert & oriented x 3, no focal neuro deficits Psychiatric: Speech and behavior appropriate   ED Course   Medications - No data to display  Orders Placed This Encounter   Procedures  . SARS CORONAVIRUS 2 (TAT 6-24 HRS) Nasopharyngeal Nasopharyngeal Swab    Standing Status:   Standing    Number of Occurrences:   1    Order Specific Question:   Is this test for diagnosis or screening    Answer:   Diagnosis of ill patient    Order Specific Question:   Symptomatic for COVID-19 as defined by CDC    Answer:   Yes    Order Specific Question:   Date of Symptom Onset    Answer:   01/19/2021    Order Specific Question:   Hospitalized for COVID-19    Answer:   No    Order Specific Question:   Admitted to ICU for COVID-19    Answer:   No    Order Specific Question:   Previously tested for COVID-19    Answer:   Yes    Order Specific Question:   Resident in a congregate (group) care setting    Answer:   No    Order Specific Question:   Employed in healthcare setting    Answer:   No    Order Specific Question:   Pregnant    Answer:   No    Order Specific Question:   Has patient completed COVID vaccination(s) (2 doses of Pfizer/Moderna 1 dose of [employer])    Answer:   No  . Ambulatory referral to Pulmonology    Referral Priority:   Routine    Referral Type:   Consultation    Referral Reason:   Specialty Services Required    Requested Specialty:   Pulmonary Disease    Number of Visits Requested:   1  . POC Influenza A & B Ag (Urgent Care)    Standing Status:   Standing    Number of Occurrences:   1    Results for orders placed or performed during the hospital encounter of 01/20/21 (from the past 24 hour(s))  SARS CORONAVIRUS 2 (TAT 6-24 HRS) Nasopharyngeal Nasopharyngeal Swab     Status: None   Collection Time: 01/20/21  3:26 PM   Specimen: Nasopharyngeal Swab  Result Value Ref Range   SARS Coronavirus 2 NEGATIVE NEGATIVE  POC Influenza A &  B Ag (Urgent Care)     Status: None   Collection Time: 01/20/21  3:48 PM  Result Value Ref Range   Influenza A Ag NEGATIVE NEGATIVE   Influenza B Ag NEGATIVE NEGATIVE   No results found.  ED Clinical  Impression  1. Acute non-recurrent pansinusitis   2. Thrush, oral   3. Encounter for laboratory testing for COVID-19 virus   4. COPD exacerbation [facility])      ED Assessment/Plan  Outside records reviewed.  As noted in HPI.  We will check flu and Covid again.  I suspect that she has a sinusitis currently so will treat with Augmentin because of the fevers of 102 and the sinus tenderness.  Saline nasal irrigation, Flonase.    Flu, Covid negative.  Her lungs are clear, she is satting well on room air.  Suspect the cough is from postnasal drip.  Deferring chest x-ray today because it would not change management.  However she is reporting increased amount of sputum and a change in the color of her sputum, so we will also treat as COPD exacerbation with the Augmentin which will cover sinusitis in addition to a pneumonia. prednisone, continue albuterol as needed.  Will prescribe her spacer for her inhalers.  Tessalon, Tylenol/ibuprofen.  Reports fatigue since having COVID in November 21, so will refer her to the [place] [facility] and also place a pulmonology referral.  She is reporting a burning tongue and mouth pain.  She does not rinse after using her inhaled steroid.  Advised her to start doing this.  I suspect that she has a mild thrush.  Will send home with nystatin swish and swallow.  Discussed labs, MDM, treatment plan, and plan for follow-up with patient. Discussed sn/sx that should prompt return to the ED. patient agrees with plan.   Meds ordered this encounter  Medications  . fluticasone (FLONASE) 50 MCG/ACT nasal spray    Sig: Place 2 sprays into both nostrils daily.    Dispense:  16 g    Refill:  0  . predniSONE (DELTASONE) 20 MG tablet    Sig: Take 2 tablets (40 mg total) by mouth daily with breakfast for 5 days.    Dispense:  10 tablet    Refill:  0  . amoxicillin-clavulanate (AUGMENTIN) 875-125 MG tablet    Sig: Take 1 tablet by mouth 2 (two) times daily for 7 days.    Dispense:  14  tablet    Refill:  0  . benzonatate (TESSALON) 200 MG capsule    Sig: Take 1 capsule (200 mg total) by mouth 3 (three) times daily as needed for cough.    Dispense:  30 capsule    Refill:  0  . Spacer/Aero-Holding Chambers (AEROCHAMBER PLUS) inhaler    Sig: Use with inhaler    Dispense:  1 each    Refill:  2    Please educate patient on use  . ibuprofen (ADVIL) 600 MG tablet    Sig: Take 1 tablet (600 mg total) by mouth every 6 (six) hours as needed.    Dispense:  30 tablet    Refill:  0  . nystatin (MYCOSTATIN) 100000 UNIT/ML suspension    Sig: Take 5 mLs (500,000 Units total) by mouth 4 (four) times daily. Swish and swallow x 7-14 days. Retain in mouth as long as possible    Dispense:  280 mL    Refill:  0       ?    Melynda Ripple, MD  01/21/21 0807  

## 2021-01-21 LAB — SARS CORONAVIRUS 2 (TAT 6-24 HRS): SARS Coronavirus 2: NEGATIVE

## 2021-01-24 ENCOUNTER — Ambulatory Visit: Payer: Medicare Other

## 2021-01-25 ENCOUNTER — Ambulatory Visit
Admission: RE | Admit: 2021-01-25 | Discharge: 2021-01-25 | Disposition: A | Discharge status: Home / Self Care | Payer: Medicare Other | Source: Ambulatory Visit | Attending: Nurse Practitioner | Admitting: Nurse Practitioner

## 2021-01-25 ENCOUNTER — Ambulatory Visit (INDEPENDENT_AMBULATORY_CARE_PROVIDER_SITE_OTHER): Payer: Medicare Other | Admitting: Nurse Practitioner

## 2021-01-25 VITALS — BP 134/73 | HR 110 | Temp 97.8°F | Resp 16 | Ht 68.0 in | Wt 289.0 lb

## 2021-01-25 DIAGNOSIS — Z8616 Personal history of COVID-19: Secondary | ICD-10-CM | POA: Diagnosis not present

## 2021-01-25 DIAGNOSIS — R0602 Shortness of breath: Secondary | ICD-10-CM

## 2021-01-25 MED ORDER — SPIRIVA RESPIMAT 1.25 MCG/ACT IN AERS: 2.0000 | INHALATION_SPRAY | Freq: Every day | RESPIRATORY_TRACT | 0 refills | Status: DC

## 2021-01-25 MED ORDER — MONTELUKAST SODIUM 10 MG PO TABS: 10.0000 mg | ORAL_TABLET | Freq: Every day | ORAL | 3 refills | Status: AC

## 2021-01-25 MED ORDER — AZITHROMYCIN 250 MG PO TABS: ORAL_TABLET | ORAL | 0 refills | Status: DC

## 2021-01-25 MED ORDER — PREDNISONE 20 MG PO TABS: 20.0000 mg | ORAL_TABLET | Freq: Every day | ORAL | 0 refills | Status: AC

## 2021-01-25 NOTE — Progress Notes (Signed)
@Patient  ID: Kimberly [name], female    DOB: 04/10/1971, 50 y.o.   MRN: [id number]  Chief Complaint  Patient presents with  . Follow-up    Covid pos 08/2020 struggling with SOB and wheezing.     Referring provider: Lurline Del, DO     HPI  Patient presents today for post COVID care clinic visit.  Patient was diagnosed with Covid in November 2021.  She has been having ongoing issues with shortness of breath and wheezing.  Patient does have a history of COPD.  Her COPD is currently being managed by her PCP.  She is currently on albuterol, duo nebs, Dulera.  Patient is currently finishing up a round of Augmentin.  Patient states that she is a current smoker and smokes 3 to 4 cigarettes/day.  She states that she is very fatigued. Denies f/c/s, n/v/d, hemoptysis, PND, chest pain or edema.     No Known Allergies  Immunization History  Administered Date(s) Administered  . Influenza Split 09/26/2012  . Influenza,inj,Quad PF,6+ Mos 09/29/2015, 09/23/2017, 10/06/2018  . Pneumococcal Polysaccharide-23 03/08/2017  . Td 09/29/2008    Past Medical History:  Diagnosis Date  . Acute cholecystitis 04/17/2019  . Anemia   . Arthritis   . Bipolar 1 disorder ([place])   . Blood transfusion without reported diagnosis 2007   post bleeding childbirth  . Cancer ([place])    eval  lt breast cancer  . COPD (chronic obstructive pulmonary disease) ([place])   . COVID-19 virus infection 06/07/2019  . Diabetes mellitus without complication ([place])    gestational  . Hand injury, right, initial encounter 03/19/2019  . Heart murmur    as a child-not adult-no cardiac work up  . History of radiation therapy 08/31/14-10/21/14   left breast/ left supraclavicular 50.4 Gy 28 fx, lef tposterior axillary boost 9.52 Gy 28 fx, left rbeast boost/ 10 Gy 5 fx  . Personal history of chemotherapy   . Personal history of radiation therapy     Tobacco History: Social History   Tobacco Use  Smoking Status Former Smoker   . Packs/day: 1.00  . Years: 32.00  . Pack years: 32.00  . Types: Cigarettes  . Quit date: 01/08/2019  . Years since quitting: 2.0  Smokeless Tobacco Never Used   Counseling given: Not Answered   Outpatient Encounter Medications as of 01/25/2021  Medication Sig  . acetaminophen (TYLENOL) 325 MG tablet Take 2 tablets (650 mg total) by mouth every 6 (six) hours as needed. (Patient taking differently: Take 650 mg by mouth every 6 (six) hours as needed for mild pain.)  . albuterol (VENTOLIN HFA) 108 (90 Base) MCG/ACT inhaler Inhale 2 puffs into the lungs every 6 (six) hours as needed for wheezing or shortness of breath.  Marland Kitchen amoxicillin-clavulanate (AUGMENTIN) 875-125 MG tablet Take 1 tablet by mouth 2 (two) times daily for 7 days.  Marland Kitchen azithromycin (ZITHROMAX) 250 MG tablet Take 2 tablets (500 mg) on day 1, then take 1 tablet (250 mg) on days 2-5  . benzonatate (TESSALON) 200 MG capsule Take 1 capsule (200 mg total) by mouth 3 (three) times daily as needed for cough.  Marland Kitchen buPROPion (WELLBUTRIN XL) 150 MG 24 hr tablet Take 150 mg by mouth daily.  . busPIRone (BUSPAR) 15 MG tablet Take 15 mg by mouth at bedtime.  . cetirizine (ZYRTEC) 10 MG tablet Take 1 tablet (10 mg total) by mouth daily as needed for allergies (and itching).  Marland Kitchen doxepin (SINEQUAN) 25 MG capsule Take 25 mg by  mouth at bedtime.  . fluticasone (FLONASE) 50 MCG/ACT nasal spray Place 2 sprays into both nostrils daily.  . hydrocortisone 2.5 % ointment Apply topically 2 (two) times daily.  Marland Kitchen ibuprofen (ADVIL) 600 MG tablet Take 1 tablet (600 mg total) by mouth every 6 (six) hours as needed.  Marland Kitchen ipratropium-albuterol (DUONEB) 0.5-2.5 (3) MG/3ML SOLN Take 3 mLs by nebulization every 4 (four) hours as needed.  . mometasone-formoterol (DULERA) 200-5 MCG/ACT AERO Inhale 2 puffs into the lungs 2 (two) times daily.  . montelukast (SINGULAIR) 10 MG tablet Take 1 tablet (10 mg total) by mouth at bedtime.  Marland Kitchen nystatin (MYCOSTATIN) 100000 UNIT/ML  suspension Take 5 mLs (500,000 Units total) by mouth 4 (four) times daily. Swish and swallow x 7-14 days. Retain in mouth as long as possible  . prazosin (MINIPRESS) 5 MG capsule Take 5 mg by mouth at bedtime.  . predniSONE (DELTASONE) 20 MG tablet Take 1 tablet (20 mg total) by mouth daily with breakfast for 5 days.  Marland Kitchen QUEtiapine (SEROQUEL) 400 MG tablet Take 400 mg by mouth at bedtime.   . rosuvastatin (CRESTOR) 10 MG tablet Take 1 tablet (10 mg total) by mouth daily.  Marland Kitchen Spacer/Aero-Holding Chambers (AEROCHAMBER PLUS) inhaler Use with inhaler  . Tiotropium Bromide Monohydrate (SPIRIVA RESPIMAT) 1.25 MCG/ACT AERS Inhale 2 puffs into the lungs daily.  . [DISCONTINUED] predniSONE (DELTASONE) 10 MG tablet TAKE 4 TABLETS DAILY WITH BREAKFAST FOR 3 DAYS, THEN 2 TABS DAILY FOR 3 DAYS, THEN 1 TAB DAILY FOR 3 DAYS, THEN 1/2 TAB DAILY FOR 2 DAYS. (Patient not taking: Reported on 01/25/2021)  . [DISCONTINUED] predniSONE (DELTASONE) 20 MG tablet Take 2 tablets (40 mg total) by mouth daily with breakfast for 5 days. (Patient not taking: Reported on 01/25/2021)   No facility-administered encounter medications on file as of 01/25/2021.     Review of Systems  Review of Systems  Constitutional: Positive for fatigue. Negative for fever.  HENT: Negative.   Respiratory: Positive for cough and shortness of breath.   Cardiovascular: Negative.  Negative for chest pain, palpitations and leg swelling.  Gastrointestinal: Negative.   Allergic/Immunologic: Negative.   Neurological: Negative.   Psychiatric/Behavioral: Negative.        Physical Exam  BP 134/73   Pulse (!) 110   Temp 97.8 F (36.6 C)   Resp 16   Ht 5\' 8"  (1.727 m)   Wt 289 lb (131.1 kg)   SpO2 98%   BMI 43.94 kg/m   Wt Readings from Last 5 Encounters:  01/25/21 289 lb (131.1 kg)  01/11/21 290 lb 8 oz (131.8 kg)  01/04/21 297 lb (134.7 kg)  12/30/20 297 lb (134.7 kg)  12/27/20 (!) 300 lb 6.4 oz (136.3 kg)     Physical Exam Vitals and  nursing note reviewed.  Constitutional:      General: She is not in acute distress.    Appearance: She is well-developed.  Cardiovascular:     Rate and Rhythm: Normal rate and regular rhythm.  Pulmonary:     Effort: Pulmonary effort is normal.     Breath sounds: Wheezing present.  Musculoskeletal:     Right lower leg: No edema.     Left lower leg: No edema.  Neurological:     Mental Status: She is alert and oriented to person, place, and time.  Psychiatric:        Mood and Affect: Mood normal.        Behavior: Behavior normal.  Imaging: DG Chest 2 View  Result Date: 01/26/2021 CLINICAL DATA:  50 year old with shortness of breath EXAM: CHEST - 2 VIEW COMPARISON:  01/04/2021 FINDINGS: Cardiomediastinal silhouette unchanged in size and contour. No evidence of central vascular congestion. No interlobular septal thickening. Unchanged right subclavian port catheter. No pneumothorax or pleural effusion. No confluent airspace disease. Degenerative changes of the spine.  No acute displaced fracture. IMPRESSION: Negative for acute cardiopulmonary disease Electronically Signed   By: Corrie Mckusick D.O.   On: 01/26/2021 14:44   DG Chest 2 View  Result Date: 01/04/2021 CLINICAL DATA:  Dyspnea for 3 days with lower extremity erythema, history of breast cancer EXAM: CHEST - 2 VIEW COMPARISON:  08/23/2020 chest radiograph. FINDINGS: Right subclavian Port-A-Cath terminates over the middle third of the SVC. Stable cardiomediastinal silhouette with normal heart size. No pneumothorax. No pleural effusion. Lungs appear clear, with no acute consolidative airspace disease and no pulmonary edema. Left axillary surgical clips again noted. IMPRESSION: No active cardiopulmonary disease. Electronically Signed   By: Ilona Sorrel M.D.   On: 01/04/2021 12:32     Assessment & Plan:   History of COVID-19 Cough Shortness of breath:   Stay well hydrated  Stay active  Deep breathing exercises  May take  tylenol or fever or pain  May take mucinex DM twice daily   Will order chest x ray:  [facility] [address], [place] [address] [phone] MON - FRI 8:00 AM - 4:00 PM - WALK IN  Will order Spiriva - 2 puffs daily  Will order prednisone - take in the morning  Will order Singulair - take at bedtime  Will order azithromycin  Please keep upcoming appointment with pulmonary   Follow up:  Follow up in 1 month or sooner if needed      Fenton Foy, NP 01/27/2021

## 2021-01-25 NOTE — Patient Instructions (Addendum)
Covid 19 Cough Shortness of breath:   Stay well hydrated  Stay active  Deep breathing exercises  May take tylenol or fever or pain  May take mucinex DM twice daily   Will order chest x ray:  [facility] [address], [place] [address] [phone] MON - FRI 8:00 AM - 4:00 PM - WALK IN  Will order Spiriva - 2 puffs daily  Will order prednisone - take in the morning  Will order Singulair - take at bedtime  Will order azithromycin  Please keep upcoming appointment with pulmonary   Follow up:  Follow up in 1 month or sooner if needed

## 2021-01-27 DIAGNOSIS — Z8616 Personal history of COVID-19: Secondary | ICD-10-CM | POA: Insufficient documentation

## 2021-01-27 DIAGNOSIS — R0602 Shortness of breath: Secondary | ICD-10-CM | POA: Insufficient documentation

## 2021-01-27 NOTE — Assessment & Plan Note (Signed)
Cough Shortness of breath:   Stay well hydrated  Stay active  Deep breathing exercises  May take tylenol or fever or pain  May take mucinex DM twice daily   Will order chest x ray:  [facility] [address], [place] [address] [phone] MON - FRI 8:00 AM - 4:00 PM - WALK IN  Will order Spiriva - 2 puffs daily  Will order prednisone - take in the morning  Will order Singulair - take at bedtime  Will order azithromycin  Please keep upcoming appointment with pulmonary   Follow up:  Follow up in 1 month or sooner if needed

## 2021-02-06 ENCOUNTER — Institutional Professional Consult (permissible substitution): Payer: Medicare Other | Admitting: Pulmonary Disease

## 2021-02-23 ENCOUNTER — Ambulatory Visit: Payer: Medicare Other

## 2021-02-28 ENCOUNTER — Institutional Professional Consult (permissible substitution): Payer: Medicare Other | Admitting: Pulmonary Disease

## 2021-03-15 ENCOUNTER — Other Ambulatory Visit: Payer: Self-pay | Admitting: Family Medicine

## 2021-03-28 ENCOUNTER — Institutional Professional Consult (permissible substitution): Payer: Medicare Other | Admitting: Emergency Medicine

## 2021-04-01 DIAGNOSIS — E119 Type 2 diabetes mellitus without complications: Secondary | ICD-10-CM | POA: Diagnosis not present

## 2021-04-01 DIAGNOSIS — Z0100 Encounter for examination of eyes and vision without abnormal findings: Secondary | ICD-10-CM | POA: Diagnosis not present

## 2021-04-10 NOTE — Patient Instructions (Signed)
It was great to see you! Thank you for allowing me to participate in your care!  I recommend that you always bring your medications to each appointment as this makes it easy to ensure we are on the correct medications and helps [name] not miss when refills are needed.  Our plans for today:  -I have placed a referral for diagnostic mammogram to evaluate the breast nodules.  We will give you a card with the number to call to schedule this.  We will follow-up with you once it results. -For the rash on the breast we have added a photo to your chart and we can continue to monitor this to see how it changes with time.  If it becomes itchy, painful, or if you have other concerns please let us know.  I have also placed a referral for optometry to evaluate the need for glasses or contact.  Feel free to reach out to any optometrist that is convenient for you to schedule an appointment.   Take care and seek immediate care sooner if you develop any concerns.   Dr. Lurline Del, JAARS

## 2021-04-10 NOTE — Progress Notes (Signed)
SUBJECTIVE:   CHIEF COMPLAINT / HPI:   Difficulties with vision: Patient is 50 year old female who presents today to discuss difficulties with vision. She states difficulty seeing numbers or letters if they are too small. She states she has some blurry vision when coming inside to outside with the change in brightness. She is mostly frustrated because she can't read her Bible anymore. She states she thinks it has worsened more in the past several weeks. She has never had to wear glasses or contacts before.   Breast Lumps She noticed them in her left breast about 6 months ago and thinks theyre changing over that time. She states she has a history of breast cancer and did have radiation on that breast 6 years ago.   Breast rash: Patient noticed it a few months ago.  Does not think it is changing.  It does not itch or hurt.  She is not sure why it is there.  She did previously have radiation therapy on that breast but that was several years ago.  She does not think the rash is getting better or worse.  She has not tried anything to help with it.  PERTINENT  PMH / PSH: History of breast cancer in her left breast status post radiation  OBJECTIVE:   BP 110/70   Pulse 96   Ht 5\' 8"  (1.727 m)   Wt 290 lb 9.6 oz (131.8 kg)   SpO2 98%   BMI 44.19 kg/m    General: NAD, pleasant, able to participate in exam HEENT: Pupils equal and reactive to light.  Bilateral lenses appear transparent.  Difficult to visualize retinas Chest: Multiple areas of nodularity present in the left breast, these are present in the superior and lateral aspect as well as one nodule on the medial aspect.  Is difficult to ascertain if these nodules are symmetric due to the size and number of them.  Chaperone Alexis General: NAD, pleasant, able to participate in exam Respiratory: No respiratory distress Skin: Rash as visualized below on the lateral aspect of the left breast.  As this is nonpainful to palpation. Psych:  Normal affect and mood      ASSESSMENT/PLAN:   History of breast cancer with new breast nodules History of breast cancer 6 years ago in the left breast status postradiation treatment and biopsy.  Patient has noticed increased nodularity of that breast over the past several months which she thinks is worsening.  This may be due to radiation changes or new presentation of the previous malignancy.  Physical exam does show multiple nodularities in all aspects of the breast which may be scar tissue versus new nodules.  Will refer for diagnostic mammogram.   Worsening vision: Patient states that over the past weeks or months she has noticed that small numbers and letters are blurry and difficult to read.  This is caused her the most stress because it makes her unable to read her Bible in the evenings.  She states that she has never had to wear glasses or contacts before but a lot of her family members have.  She request referral for optometry.  We will place that referral.  Breast rash: As noted in image above.  No pruritus or pain with this rash.  Is been there for at least a few months and does not seem to be changing.  The patient did previously have radiation therapy for history of breast cancer on that breast but this was several years ago.  She  thinks the rash came up earlier this year.  I discussed including an image into her chart and monitor and it for change.  I recommend that she follow-up with [name] if it becomes itchy or painful or starts changing.  She is also going to get a diagnostic mammogram for new breast nodules and wants that is worked up further we can continue addressing the rash if it is still present.  Patient is due for Pap smear.  She wishes to return for this at a later date.Lurline Del, [place]

## 2021-04-11 ENCOUNTER — Encounter: Payer: Self-pay | Admitting: Family Medicine

## 2021-04-11 ENCOUNTER — Ambulatory Visit (INDEPENDENT_AMBULATORY_CARE_PROVIDER_SITE_OTHER): Payer: Medicare Other | Admitting: Family Medicine

## 2021-04-11 ENCOUNTER — Other Ambulatory Visit: Payer: Self-pay

## 2021-04-11 VITALS — BP 110/70 | HR 96 | Ht 68.0 in | Wt 290.6 lb

## 2021-04-11 DIAGNOSIS — N63 Unspecified lump in unspecified breast: Secondary | ICD-10-CM

## 2021-04-11 DIAGNOSIS — Z853 Personal history of malignant neoplasm of breast: Secondary | ICD-10-CM | POA: Diagnosis not present

## 2021-04-11 DIAGNOSIS — H539 Unspecified visual disturbance: Secondary | ICD-10-CM | POA: Diagnosis not present

## 2021-04-11 NOTE — Assessment & Plan Note (Signed)
History of breast cancer 6 years ago in the left breast status postradiation treatment and biopsy.  Patient has noticed increased nodularity of that breast over the past several months which she thinks is worsening.  This may be due to radiation changes or new presentation of the previous malignancy.  Physical exam does show multiple nodularities in all aspects of the breast which may be scar tissue versus new nodules.  Will refer for diagnostic mammogram.

## 2021-04-12 ENCOUNTER — Other Ambulatory Visit: Payer: Self-pay | Admitting: Family Medicine

## 2021-04-12 DIAGNOSIS — Z853 Personal history of malignant neoplasm of breast: Secondary | ICD-10-CM

## 2021-04-12 DIAGNOSIS — N63 Unspecified lump in unspecified breast: Secondary | ICD-10-CM

## 2021-05-24 ENCOUNTER — Ambulatory Visit: Payer: Medicare Other

## 2021-05-24 ENCOUNTER — Other Ambulatory Visit: Payer: Self-pay | Admitting: Oncology

## 2021-05-24 ENCOUNTER — Other Ambulatory Visit: Payer: Self-pay | Admitting: Family Medicine

## 2021-05-24 ENCOUNTER — Ambulatory Visit
Admission: RE | Admit: 2021-05-24 | Discharge: 2021-05-24 | Disposition: A | Discharge status: Home / Self Care | Payer: Medicare Other | Source: Ambulatory Visit | Attending: Family Medicine | Admitting: Family Medicine

## 2021-05-24 ENCOUNTER — Other Ambulatory Visit: Payer: Self-pay

## 2021-05-24 DIAGNOSIS — Z853 Personal history of malignant neoplasm of breast: Secondary | ICD-10-CM

## 2021-05-24 DIAGNOSIS — N63 Unspecified lump in unspecified breast: Secondary | ICD-10-CM

## 2021-05-24 DIAGNOSIS — R922 Inconclusive mammogram: Secondary | ICD-10-CM | POA: Diagnosis not present

## 2021-05-24 DIAGNOSIS — N6001 Solitary cyst of right breast: Secondary | ICD-10-CM | POA: Diagnosis not present

## 2021-05-24 NOTE — Progress Notes (Signed)
Kimberly [name] just had mammography showing possible local recurrence.  We are making her a follow-up visit here to evaluate further.  She is also being referred to surgery for further evaluation and treatment of his breast cysts.

## 2021-05-25 ENCOUNTER — Telehealth: Payer: Self-pay | Admitting: Adult Health

## 2021-05-25 NOTE — Telephone Encounter (Signed)
Scheduled appt per 8/3 sch msg. Pt aware.  

## 2021-05-31 ENCOUNTER — Telehealth: Payer: Self-pay

## 2021-05-31 NOTE — Telephone Encounter (Signed)
Call to pt - no vm to lvm  RE: confirm insurance and schedule AWV on my schedule if times are convenient for patient or other AWV schedule as template permits.

## 2021-06-08 ENCOUNTER — Other Ambulatory Visit: Payer: Self-pay | Admitting: *Deleted

## 2021-06-08 MED ORDER — ALBUTEROL SULFATE HFA 108 (90 BASE) MCG/ACT IN AERS: 2.0000 | INHALATION_SPRAY | Freq: Four times a day (QID) | RESPIRATORY_TRACT | 1 refills | Status: DC | PRN

## 2021-06-08 MED ORDER — ROSUVASTATIN CALCIUM 10 MG PO TABS: 10.0000 mg | ORAL_TABLET | Freq: Every day | ORAL | 1 refills | Status: DC

## 2021-06-10 ENCOUNTER — Emergency Department (HOSPITAL_COMMUNITY): Payer: Medicare Other

## 2021-06-10 ENCOUNTER — Emergency Department (HOSPITAL_COMMUNITY)
Admission: EM | Admit: 2021-06-10 | Discharge: 2021-06-10 | Disposition: A | Discharge status: Home / Self Care | Payer: Medicare Other | Attending: Emergency Medicine | Admitting: Emergency Medicine

## 2021-06-10 ENCOUNTER — Other Ambulatory Visit: Payer: Self-pay

## 2021-06-10 ENCOUNTER — Encounter (HOSPITAL_COMMUNITY): Payer: Self-pay

## 2021-06-10 DIAGNOSIS — I499 Cardiac arrhythmia, unspecified: Secondary | ICD-10-CM | POA: Diagnosis not present

## 2021-06-10 DIAGNOSIS — Z20822 Contact with and (suspected) exposure to covid-19: Secondary | ICD-10-CM | POA: Insufficient documentation

## 2021-06-10 DIAGNOSIS — Z87891 Personal history of nicotine dependence: Secondary | ICD-10-CM | POA: Diagnosis not present

## 2021-06-10 DIAGNOSIS — J441 Chronic obstructive pulmonary disease with (acute) exacerbation: Secondary | ICD-10-CM | POA: Insufficient documentation

## 2021-06-10 DIAGNOSIS — Z853 Personal history of malignant neoplasm of breast: Secondary | ICD-10-CM | POA: Insufficient documentation

## 2021-06-10 DIAGNOSIS — R109 Unspecified abdominal pain: Secondary | ICD-10-CM | POA: Insufficient documentation

## 2021-06-10 DIAGNOSIS — K625 Hemorrhage of anus and rectum: Secondary | ICD-10-CM | POA: Diagnosis not present

## 2021-06-10 DIAGNOSIS — Z7951 Long term (current) use of inhaled steroids: Secondary | ICD-10-CM | POA: Diagnosis not present

## 2021-06-10 DIAGNOSIS — R6889 Other general symptoms and signs: Secondary | ICD-10-CM | POA: Diagnosis not present

## 2021-06-10 DIAGNOSIS — R404 Transient alteration of awareness: Secondary | ICD-10-CM | POA: Diagnosis not present

## 2021-06-10 DIAGNOSIS — Z743 Need for continuous supervision: Secondary | ICD-10-CM | POA: Diagnosis not present

## 2021-06-10 DIAGNOSIS — Z8616 Personal history of COVID-19: Secondary | ICD-10-CM | POA: Diagnosis not present

## 2021-06-10 LAB — I-STAT CHEM 8, ED
BUN: 4 mg/dL — ABNORMAL LOW (ref 6–20)
Calcium, Ion: 1.15 mmol/L (ref 1.15–1.40)
Chloride: 104 mmol/L (ref 98–111)
Creatinine, Ser: 0.9 mg/dL (ref 0.44–1.00)
Glucose, Bld: 78 mg/dL (ref 70–99)
HCT: 32 % — ABNORMAL LOW (ref 36.0–46.0)
Hemoglobin: 10.9 g/dL — ABNORMAL LOW (ref 12.0–15.0)
Potassium: 3.5 mmol/L (ref 3.5–5.1)
Sodium: 141 mmol/L (ref 135–145)
TCO2: 26 mmol/L (ref 22–32)

## 2021-06-10 LAB — COMPREHENSIVE METABOLIC PANEL
ALT: 9 U/L (ref 0–44)
AST: 14 U/L — ABNORMAL LOW (ref 15–41)
Albumin: 3.9 g/dL (ref 3.5–5.0)
Alkaline Phosphatase: 59 U/L (ref 38–126)
Anion gap: 6 (ref 5–15)
BUN: 7 mg/dL (ref 6–20)
CO2: 26 mmol/L (ref 22–32)
Calcium: 8.9 mg/dL (ref 8.9–10.3)
Chloride: 108 mmol/L (ref 98–111)
Creatinine, Ser: 0.89 mg/dL (ref 0.44–1.00)
GFR, Estimated: >60 mL/min (ref >60)
Glucose, Bld: 80 mg/dL (ref 70–99)
Potassium: 3.6 mmol/L (ref 3.5–5.1)
Sodium: 140 mmol/L (ref 135–145)
Total Bilirubin: 0.4 mg/dL (ref 0.3–1.2)
Total Protein: 7.6 g/dL (ref 6.5–8.1)

## 2021-06-10 LAB — POC OCCULT BLOOD, ED: Fecal Occult Bld: NEGATIVE

## 2021-06-10 LAB — TYPE AND SCREEN
ABO/RH(D): O POS
Antibody Screen: NEGATIVE

## 2021-06-10 LAB — CBC WITH DIFFERENTIAL/PLATELET
Abs Immature Granulocytes: 0.03 10*3/uL (ref 0.00–0.07)
Basophils Absolute: 0 10*3/uL (ref 0.0–0.1)
Basophils Relative: 1 %
Eosinophils Absolute: 0 10*3/uL (ref 0.0–0.5)
Eosinophils Relative: 0 %
HCT: 32.5 % — ABNORMAL LOW (ref 36.0–46.0)
Hemoglobin: 9.5 g/dL — ABNORMAL LOW (ref 12.0–15.0)
Immature Granulocytes: 1 %
Lymphocytes Relative: 35 %
Lymphs Abs: 2.1 10*3/uL (ref 0.7–4.0)
MCH: 21.9 pg — ABNORMAL LOW (ref 26.0–34.0)
MCHC: 29.2 g/dL — ABNORMAL LOW (ref 30.0–36.0)
MCV: 74.9 fL — ABNORMAL LOW (ref 80.0–100.0)
Monocytes Absolute: 0.8 10*3/uL (ref 0.1–1.0)
Monocytes Relative: 13 %
Neutro Abs: 3.1 10*3/uL (ref 1.7–7.7)
Neutrophils Relative %: 50 %
Platelets: 276 10*3/uL (ref 150–400)
RBC: 4.34 MIL/uL (ref 3.87–5.11)
RDW: 19.9 % — ABNORMAL HIGH (ref 11.5–15.5)
WBC: 6.2 10*3/uL (ref 4.0–10.5)
nRBC: 0 % (ref 0.0–0.2)

## 2021-06-10 LAB — LACTIC ACID, PLASMA
Lactic Acid, Venous: 1.3 mmol/L (ref 0.5–1.9)
Lactic Acid, Venous: 0.8 mmol/L (ref 0.5–1.9)

## 2021-06-10 LAB — RESP PANEL BY RT-PCR (FLU A&B, COVID) ARPGX2
Influenza A by PCR: NEGATIVE
Influenza B by PCR: NEGATIVE
SARS Coronavirus 2 by RT PCR: NEGATIVE

## 2021-06-10 LAB — LIPASE, BLOOD: Lipase: 24 U/L (ref 11–51)

## 2021-06-10 MED ORDER — SODIUM CHLORIDE 0.9 % IV BOLUS
1000.0000 mL | Freq: Once | INTRAVENOUS | Status: AC
  Administered 2021-06-10: 1000 mL via INTRAVENOUS

## 2021-06-10 MED ORDER — PANTOPRAZOLE SODIUM 40 MG PO TBEC: 40.0000 mg | DELAYED_RELEASE_TABLET | Freq: Every day | ORAL | 0 refills | Status: DC

## 2021-06-10 MED ORDER — IOHEXOL 350 MG/ML SOLN
100.0000 mL | Freq: Once | INTRAVENOUS | Status: AC | PRN
  Administered 2021-06-10: 80 mL via INTRAVENOUS

## 2021-06-10 MED ORDER — SODIUM CHLORIDE 0.9 % IV SOLN: Freq: Once | INTRAVENOUS | Status: AC

## 2021-06-10 MED ORDER — HEPARIN SOD (PORK) LOCK FLUSH 100 UNIT/ML IV SOLN
500.0000 [IU] | Freq: Once | INTRAVENOUS | Status: AC
  Administered 2021-06-10: 500 [IU]
  Filled 2021-06-10: qty 5

## 2021-06-10 MED ORDER — PANTOPRAZOLE SODIUM 40 MG IV SOLR
40.0000 mg | Freq: Once | INTRAVENOUS | Status: AC
  Administered 2021-06-10: 40 mg via INTRAVENOUS
  Filled 2021-06-10: qty 40

## 2021-06-10 MED ORDER — MORPHINE SULFATE (PF) 2 MG/ML IV SOLN
2.0000 mg | Freq: Once | INTRAVENOUS | Status: AC
  Administered 2021-06-10: 2 mg via INTRAVENOUS
  Filled 2021-06-10: qty 1

## 2021-06-10 NOTE — ED Triage Notes (Signed)
Pt came from home via EMS. Per EMS, pt has had bright red bloody stools x 1 week. Family states on scene that patient passed large blood clots in stool today. Pt hypotensive with EMS at 90/50. Pt initially tachycardic in 120's. Pt arousable to voice. AMS. Per EMS, pt stated "I need to get up and wash the dishes" Pt normally ambulatory at home without assistance. Pt unable to stand with EMS today.

## 2021-06-10 NOTE — ED Provider Notes (Signed)
[facility] DEPT Provider Note   CSN: [id number] Arrival date & time: 06/10/21  1553     History Chief Complaint  Patient presents with   GI Bleeding    Kimberly [name] is a 49 y.o. female.  Patient states she is has been having rectal bleeding for over a week.  Patient has a history of hemorrhoids.  The history is provided by the patient and medical records.  Rectal Bleeding Quality:  Bright red Amount:  Moderate Timing:  Intermittent Chronicity:  New Context: not anal fissures   Similar prior episodes: no   Relieved by:  Nothing Worsened by:  Nothing Ineffective treatments:  None tried Associated symptoms: abdominal pain   Risk factors: no anticoagulant use       Past Medical History:  Diagnosis Date   Acute cholecystitis 04/17/2019   Anemia    Arthritis    Bipolar 1 disorder ([place])    Blood transfusion without reported diagnosis 2007   post bleeding childbirth   Cancer ([place])    eval  lt breast cancer   COPD (chronic obstructive pulmonary disease) ([place])    COVID-19 virus infection 06/07/2019   Diabetes mellitus without complication (H[facility])    gestational   Hand injury, right, initial encounter 03/19/2019   Heart murmur    as a child-not adult-no cardiac work up   History of radiation therapy 08/31/14-10/21/14   left breast/ left supraclavicular 50.4 Gy 28 fx, lef tposterior axillary boost 9.52 Gy 28 fx, left rbeast boost/ 10 Gy 5 fx   Personal history of chemotherapy    Personal history of radiation therapy     Patient Active Problem List   Diagnosis Date Noted   History of COVID-19 01/27/2021   Shortness of breath 01/27/2021   Elevated LDL cholesterol level 12/30/2020   Rash and nonspecific skin eruption 12/27/2020   Noncardiac chest pain 12/27/2020   No-show for appointment 10/13/2020   COPD exacerbation ([place]) 08/24/2020   Otitis media 08/10/2020   Otitis externa of left ear 08/02/2020   Oligouria 03/10/2020   History  of breast cancer 02/11/2020   Total body pain 01/13/2019   Postmenopausal bleeding 10/25/2017   Chronic pain of both knees 05/03/2017   Upper airway cough syndrome 03/27/2017   Discogenic low back pain 03/20/2017   COPD (chronic obstructive pulmonary disease) ([place]) 03/20/2017   Tachycardia    Dyspnea on exertion    Grieving 11/21/2016   Mixed incontinence 09/29/2015   Genetic testing 08/26/2015   Malignant neoplasm of upper-outer quadrant of left breast in female, estrogen receptor positive ([place]) 05/07/2014   Disorder of bladder 09/29/2008   LOW BACK PAIN, CHRONIC 09/29/2008   Morbid (severe) obesity due to excess calories ([place]) 11/19/2007   ANEMIA, IRON DEFICIENCY, CHRONIC 11/14/2007   Mixed bipolar I disorder ([place]) 02/20/2007   Tobacco use disorder 02/20/2007    Past Surgical History:  Procedure Laterality Date   BREAST BIOPSY Left 04/01/14   BREAST BIOPSY Right 04/19/14   BREAST LUMPECTOMY WITH AXILLARY LYMPH NODE DISSECTION Bilateral 05/03/14   CHOLECYSTECTOMY N/A 04/18/2019   Procedure: LAPAROSCOPIC CHOLECYSTECTOMY WITH INTRAOPERATIVE CHOLANGIOGRAM;  Surgeon: Jovita Kussmaul, MD;  Location: [facility];  Service: General;  Laterality: N/A;   DILATION AND CURETTAGE OF UTERUS     after childbirth   MULTIPLE TOOTH EXTRACTIONS     only 5 left   PORTACATH PLACEMENT Right 06/18/2014   Procedure: INSERTION PORT-A-CATH;  Surgeon: [name], MD;  Location: [facility];  Service:  General;  Laterality: Right;   TONSILLECTOMY AND ADENOIDECTOMY       OB History   No obstetric history on file.     Family History  Problem Relation Age of Onset   Heart disease Father    Cancer Father        Prostate   Colon cancer Maternal Grandfather    Colon cancer Paternal Grandmother    Stomach cancer Neg Hx    Esophageal cancer Neg Hx     Social History   Tobacco Use   Smoking status: Former    Packs/day: 1.00    Years: 32.00    Pack years: 32.00    Types: Cigarettes     Quit date: 01/08/2019    Years since quitting: 2.4   Smokeless tobacco: Never  Vaping Use   Vaping Use: Never used  Substance Use Topics   Alcohol use: No   Drug use: No    Home Medications Prior to Admission medications   Medication Sig Start Date End Date Taking? Authorizing Provider  albuterol (VENTOLIN HFA) 108 (90 Base) MCG/ACT inhaler Inhale 2 puffs into the lungs every 6 (six) hours as needed for wheezing or shortness of breath. 06/08/21  Yes Gifford Shave, MD  busPIRone (BUSPAR) 15 MG tablet Take 15 mg by mouth 3 (three) times daily. 10/27/18  Yes [provider]  doxepin (SINEQUAN) 75 MG capsule Take 75 mg by mouth at bedtime. 05/25/21  Yes [provider]  ibuprofen (ADVIL) 600 MG tablet Take 1 tablet (600 mg total) by mouth every 6 (six) hours as needed. Patient taking differently: Take 600 mg by mouth every 6 (six) hours as needed for moderate pain. 01/20/21  Yes Melynda Ripple, MD  ipratropium-albuterol (DUONEB) 0.5-2.5 (3) MG/3ML SOLN Take 3 mLs by nebulization every 4 (four) hours as needed. 01/11/21  Yes Welborn, Ryan, DO  mometasone-formoterol (DULERA) 200-5 MCG/ACT AERO Inhale 2 puffs into the lungs 2 (two) times daily. 01/11/21  Yes Welborn, Ryan, DO  montelukast (SINGULAIR) 10 MG tablet Take 1 tablet (10 mg total) by mouth at bedtime. 01/25/21  Yes Fenton Foy, NP  nystatin (MYCOSTATIN) 100000 UNIT/ML suspension Take 5 mLs (500,000 Units total) by mouth 4 (four) times daily. Swish and swallow x 7-14 days. Retain in mouth as long as possible Patient taking differently: Take 500,000 Units by mouth 4 (four) times daily as needed (thrush). Swish and swallow x 7-14 days. Retain in mouth as long as possible 01/20/21  Yes Melynda Ripple, MD  prazosin (MINIPRESS) 1 MG capsule Take 1 mg by mouth at bedtime. 03/29/21  Yes [provider]  QUEtiapine (SEROQUEL) 400 MG tablet Take 400 mg by mouth at bedtime.  11/29/19  Yes [provider]   rosuvastatin (CRESTOR) 10 MG tablet Take 1 tablet (10 mg total) by mouth daily. Patient taking differently: Take 10 mg by mouth at bedtime. 06/08/21  Yes Gifford Shave, MD  Tiotropium Bromide Monohydrate (SPIRIVA RESPIMAT) 1.25 MCG/ACT AERS Inhale 2 puffs into the lungs daily.   Yes [provider]  acetaminophen (TYLENOL) 325 MG tablet Take 2 tablets (650 mg total) by mouth every 6 (six) hours as needed. Patient not taking: No sig reported 07/15/20   Darr, Edison Nasuti, [name]  azithromycin (ZITHROMAX) 250 MG tablet Take 2 tablets (500 mg) on day 1, then take 1 tablet (250 mg) on days 2-5 Patient not taking: No sig reported 01/25/21   Fenton Foy, NP  benzonatate (TESSALON) 200 MG capsule Take 1 capsule (200  mg total) by mouth 3 (three) times daily as needed for cough. Patient not taking: No sig reported 01/20/21   Melynda Ripple, MD  cetirizine (ZYRTEC) 10 MG tablet Take 1 tablet (10 mg total) by mouth daily as needed for allergies (and itching). Patient not taking: No sig reported 01/04/21   Tedd Sias, PA  fluticasone (FLONASE) 50 MCG/ACT nasal spray Place 2 sprays into both nostrils daily. Patient not taking: No sig reported 01/20/21   Melynda Ripple, MD  hydrocortisone 2.5 % ointment Apply topically 2 (two) times daily. Patient not taking: No sig reported 12/27/20   Meccariello, Bernita Raisin, DO  Spacer/Aero-Holding Chambers (AEROCHAMBER PLUS) inhaler Use with inhaler 01/20/21   Melynda Ripple, MD  Tiotropium Bromide Monohydrate (SPIRIVA RESPIMAT) 1.25 MCG/ACT AERS Inhale 2 puffs into the lungs daily. 01/25/21 02/24/21  Fenton Foy, NP    Allergies    Patient has no known allergies.  Review of Systems   Review of Systems  Constitutional:  Negative for appetite change and fatigue.  HENT:  Negative for congestion, ear discharge and sinus pressure.   Eyes:  Negative for discharge.  Respiratory:  Negative for cough.   Cardiovascular:  Negative for chest pain.   Gastrointestinal:  Positive for abdominal pain and hematochezia. Negative for diarrhea.       Rectal bleeding  Genitourinary:  Negative for frequency and hematuria.  Musculoskeletal:  Negative for back pain.  Skin:  Negative for rash.  Neurological:  Negative for seizures and headaches.  Psychiatric/Behavioral:  Negative for hallucinations.    Physical Exam Updated Vital Signs BP (!) 151/61   Pulse 76   Temp 97.7 F (36.5 C) (Oral)   Resp (!) 31   Ht '5\' 10"'$  (1.778 m)   Wt 131.8 kg   SpO2 100%   BMI 41.69 kg/m   Physical Exam Vitals and nursing note reviewed.  Constitutional:      Appearance: She is well-developed.  HENT:     Head: Normocephalic.     Nose: Nose normal.  Eyes:     General: No scleral icterus.    Conjunctiva/sclera: Conjunctivae normal.  Neck:     Thyroid: No thyromegaly.  Cardiovascular:     Rate and Rhythm: Normal rate and regular rhythm.     Heart sounds: No murmur heard.   No friction rub. No gallop.  Pulmonary:     Breath sounds: No stridor. No wheezing or rales.  Chest:     Chest wall: No tenderness.  Abdominal:     General: There is no distension.     Tenderness: There is abdominal tenderness. There is no rebound.     Comments: Minimal tenderness  Genitourinary:    Comments: Rectal exam patient has external hemorrhoids.  No bleeding noticed.  On digital exam she had [place] stool that was heme-negative Musculoskeletal:        General: Normal range of motion.     Cervical back: Neck supple.  Lymphadenopathy:     Cervical: No cervical adenopathy.  Skin:    Findings: No erythema or rash.  Neurological:     Mental Status: She is alert and oriented to person, place, and time.     Motor: No abnormal muscle tone.     Coordination: Coordination normal.  Psychiatric:        Behavior: Behavior normal.    ED Results / Procedures / Treatments   Labs (all labs ordered are listed, but only abnormal results are displayed) Labs Reviewed  CBC WITH  DIFFERENTIAL/PLATELET - Abnormal; Notable for the following components:      Result Value   Hemoglobin 9.5 (*)    HCT 32.5 (*)    MCV 74.9 (*)    MCH 21.9 (*)    MCHC 29.2 (*)    RDW 19.9 (*)    All other components within normal limits  COMPREHENSIVE METABOLIC PANEL - Abnormal; Notable for the following components:   AST 14 (*)    All other components within normal limits  I-STAT CHEM 8, ED - Abnormal; Notable for the following components:   BUN 4 (*)    Hemoglobin 10.9 (*)    HCT 32.0 (*)    All other components within normal limits  RESP PANEL BY RT-PCR (FLU A&B, COVID) ARPGX2  LACTIC ACID, PLASMA  LACTIC ACID, PLASMA  LIPASE, BLOOD  POC OCCULT BLOOD, ED  TYPE AND SCREEN    EKG None  Radiology CT ABDOMEN PELVIS W CONTRAST  Result Date: 06/10/2021 CLINICAL DATA:  Blood clots in the stool. Generalized abdominal pain. EXAM: CT ABDOMEN AND PELVIS WITH CONTRAST TECHNIQUE: Multidetector CT imaging of the abdomen and pelvis was performed using the standard protocol following bolus administration of intravenous contrast. CONTRAST:  67m OMNIPAQUE IOHEXOL 350 MG/ML SOLN COMPARISON:  None. FINDINGS: Lower chest: No acute abnormality. Hepatobiliary: Liver normal in size and overall attenuation. Multiple small low-attenuation lesions, most too small to characterize. There is a 7 mm lesion the dome of the right lobe and a 9 mm low-density lesion the inferior margin of the right lobe. The larger lesions are consistent with cysts and all of the lesions are likely cysts. Gallbladder surgically absent. No bile duct dilation. Pancreas: Unremarkable. No pancreatic ductal dilatation or surrounding inflammatory changes. Spleen: Normal in size without focal abnormality. Adrenals/Urinary Tract: Adrenal glands are unremarkable. Kidneys are normal, without renal calculi, focal lesion, or hydronephrosis. Bladder is unremarkable. Stomach/Bowel: Normal stomach. Small bowel and colon are normal in caliber. No  wall thickening. No inflammation. No evidence of a mass. No findings to account for GI bleeding. Normal appendix visualized. Vascular/Lymphatic: Mild aortic atherosclerosis. No aneurysm. No enlarged lymph nodes. Reproductive: Uterus and bilateral adnexa are unremarkable. Other: No abdominal wall hernia or abnormality. No abdominopelvic ascites. Musculoskeletal: No fracture or acute finding.  No bone lesion. IMPRESSION: 1. No acute findings within the abdomen or pelvis. 2. No bowel abnormality to account for GI bleeding. 3. Small low-attenuation liver lesions, most likely cysts, not fully characterized. 4. Mild aortic atherosclerosis. Electronically Signed   By: DLajean ManesM.D.   On: 06/10/2021 19:45    Procedures Procedures   Medications Ordered in ED Medications  heparin lock flush 100 unit/mL (has no administration in time range)  sodium chloride 0.9 % bolus 1,000 mL (0 mLs Intravenous Stopped 06/10/21 1826)  pantoprazole (PROTONIX) injection 40 mg (40 mg Intravenous Given 06/10/21 1756)  morphine 2 MG/ML injection 2 mg (2 mg Intravenous Given 06/10/21 1753)  iohexol (OMNIPAQUE) 350 MG/ML injection 100 mL (80 mLs Intravenous Contrast Given 06/10/21 1924)  0.9 %  sodium chloride infusion ( Intravenous Stopped 06/10/21 2047)    ED Course  I have reviewed the triage vital signs and the nursing notes.  Pertinent labs & imaging results that were available during my care of the patient were reviewed by me and considered in my medical decision making (see chart for details).  Patient with history of rectal bleeding.  Hemoglobin is relatively stable between 9 and 10.  CT scans negative.  She is  not orthostatic.  At discharge she was no longer having abdominal discomfort MDM Rules/Calculators/A&P                           Patient with history of rectal bleeding and stable.  She will be discharged home on Protonix and referred to gastroenterology Final Clinical Impression(s) / ED Diagnoses Final  diagnoses:  None    Rx / DC Orders ED Discharge Orders     None        Milton Ferguson, MD 06/10/21 2112

## 2021-06-10 NOTE — Discharge Instructions (Addendum)
Call Dr. Collene Mares Monday and let them know that you are in the emergency department with bleeding from your rectum and we felt like you need to be seen this week.  Return to the emergency department if having profuse bleeding again.

## 2021-06-15 ENCOUNTER — Other Ambulatory Visit: Payer: Self-pay | Admitting: Nurse Practitioner

## 2021-06-18 NOTE — Progress Notes (Deleted)
   SUBJECTIVE:   CHIEF COMPLAINT / HPI:   No chief complaint on file.    Kimberly [name] is a 50 y.o. female here for ***   Pt reports ***    PERTINENT  PMH / PSH: reviewed and updated as appropriate   OBJECTIVE:   There were no vitals taken for this visit.  ***  ASSESSMENT/PLAN:   No problem-specific Assessment & Plan notes found for this encounter.     Kimberly Hensen, DO PGY-3, [facility] 06/18/2021      {    This will disappear when note is signed, click to select method of visit    :1}

## 2021-06-19 ENCOUNTER — Ambulatory Visit: Payer: Medicare Other

## 2021-06-21 ENCOUNTER — Other Ambulatory Visit: Payer: Self-pay | Admitting: *Deleted

## 2021-06-21 DIAGNOSIS — Z170 Estrogen receptor positive status [ER+]: Secondary | ICD-10-CM

## 2021-06-22 ENCOUNTER — Inpatient Hospital Stay: Payer: Medicare Other

## 2021-06-22 ENCOUNTER — Inpatient Hospital Stay: Payer: Medicare Other | Admitting: Adult Health

## 2021-06-22 ENCOUNTER — Telehealth: Payer: Self-pay | Admitting: Adult Health

## 2021-06-22 ENCOUNTER — Telehealth: Payer: Self-pay

## 2021-06-22 NOTE — Telephone Encounter (Signed)
Scheduled per 9/1 sch msg. Called and spoke with patient. Confirmed appt

## 2021-06-22 NOTE — Telephone Encounter (Signed)
Pt missed appt with Wilber Bihari, NP this morning; states she forgot about appt. Offered appt for tomorrow but pt is scheduled to see Dr Donne Hazel 06/23/21. Message sent to scheduling to r/s pt for next week. Pt is aware and verbalized understanding.

## 2021-06-23 ENCOUNTER — Other Ambulatory Visit: Payer: Self-pay | Admitting: Nurse Practitioner

## 2021-06-23 DIAGNOSIS — L988 Other specified disorders of the skin and subcutaneous tissue: Secondary | ICD-10-CM | POA: Diagnosis not present

## 2021-06-23 DIAGNOSIS — R2231 Localized swelling, mass and lump, right upper limb: Secondary | ICD-10-CM | POA: Diagnosis not present

## 2021-06-28 ENCOUNTER — Ambulatory Visit (INDEPENDENT_AMBULATORY_CARE_PROVIDER_SITE_OTHER): Payer: Medicare Other | Admitting: Family Medicine

## 2021-06-28 ENCOUNTER — Other Ambulatory Visit: Payer: Self-pay

## 2021-06-28 VITALS — BP 122/93 | HR 99 | Ht 70.0 in | Wt 293.0 lb

## 2021-06-28 DIAGNOSIS — S0502XA Injury of conjunctiva and corneal abrasion without foreign body, left eye, initial encounter: Secondary | ICD-10-CM

## 2021-06-28 DIAGNOSIS — Z23 Encounter for immunization: Secondary | ICD-10-CM | POA: Diagnosis not present

## 2021-06-28 MED ORDER — DOUBLE ANTIBIOTIC 500-10000 UNIT/GM EX OINT: TOPICAL_OINTMENT | Freq: Two times a day (BID) | CUTANEOUS | Status: DC

## 2021-06-28 NOTE — Progress Notes (Signed)
   SUBJECTIVE:   CHIEF COMPLAINT / HPI:    Kimberly [name] is a 50 y.o. female here for  left eye with watery drainage for the past 2 months. She sleeps with a cloth to catch the fluid that comes out. Constantly has to wipe her eye. Drainage occurs all day long. Has eye itching. "This is aggravating." She scratches and rubs her eyes. Has occasional headaches (not new) which resolve with ibuprofen. Takes Singulair and Zyrtec without relief. Used Opcon tears without relief. Denies eye trauma, no known foreign bodies. Does not recall anything getting into her eye.    PERTINENT  PMH / PSH: reviewed and updated as appropriate   OBJECTIVE:   BP (!) 122/93   Pulse 99   Ht '5\' 10"'$  (1.778 m)   Wt 293 lb (132.9 kg)   SpO2 100%   BMI 42.04 kg/m   GEN: well appearing female in no acute distress  EYES: EOMi, pupils equal, round and reactive to light and accommodation, fluorescein stain uptake in the 2-4 o'clock and 8-10 o'clock, no foreign body, no hyphema or chemosis, gross vision intact, Dr. Cy Blamer present.  CVS: well perfused  RESP: speaking in full sentences without pause, no respiratory distress     ASSESSMENT/PLAN:   Left corneal abrasion Fluorescein stain applied and uptake on both medial and lateral conjunctiva examined under Wood's Lamp c/f left corneal abrasion. Discussed with fellow preceptor Dr. Cy Blamer who agrees with fluorescein uptake assessement.  No foreign body removed. Pt able to read and identify colors in exam room. Doubt uveitis or hyphema. Polysporin applied and Rx medication provided in clinic. Given time frame of sx. Follow up in office in 1 week, if not seen by ophthalmology within the week. Pt previously referred and was to follow up with [facility]. Contact information given (see AVS).      Lyndee Hensen, DO PGY-3, [facility] 06/28/2021

## 2021-06-28 NOTE — Patient Instructions (Signed)
Thank you for coming into the office today.   As discussed, use the eye ointment twice a day for 7 days. Be sure to follow up with our office in 1 week or the eye doctor.   Call [facility] for an appointment to get your vision checked.   Ida  Address: [address], [place], [place] [address] Phone: [phone]  Take Care,   Dr. Susa Simmonds

## 2021-06-29 DIAGNOSIS — H1013 Acute atopic conjunctivitis, bilateral: Secondary | ICD-10-CM | POA: Diagnosis not present

## 2021-06-30 ENCOUNTER — Inpatient Hospital Stay: Payer: Medicare Other

## 2021-06-30 ENCOUNTER — Inpatient Hospital Stay: Payer: Medicare Other | Admitting: Adult Health

## 2021-06-30 DIAGNOSIS — S0502XA Injury of conjunctiva and corneal abrasion without foreign body, left eye, initial encounter: Secondary | ICD-10-CM | POA: Insufficient documentation

## 2021-06-30 NOTE — Assessment & Plan Note (Addendum)
Fluorescein stain applied and uptake on both medial and lateral conjunctiva examined under Wood's Lamp c/f left corneal abrasion. Discussed with fellow preceptor Dr. Cy Blamer who agrees with fluorescein uptake assessement.  No foreign body removed. Pt able to read and identify colors in exam room. Doubt uveitis or hyphema. Polysporin applied and Rx medication provided in clinic. Given time frame of sx. Follow up in office in 1 week, if not seen by ophthalmology within the week. Pt previously referred and was to follow up with [facility]. Contact information given (see AVS).

## 2021-07-03 ENCOUNTER — Inpatient Hospital Stay: Payer: Medicare Other | Admitting: Adult Health

## 2021-07-03 ENCOUNTER — Telehealth: Payer: Self-pay

## 2021-07-03 ENCOUNTER — Inpatient Hospital Stay: Payer: Medicare Other | Attending: Adult Health

## 2021-07-03 DIAGNOSIS — Z23 Encounter for immunization: Secondary | ICD-10-CM | POA: Insufficient documentation

## 2021-07-03 DIAGNOSIS — Z9221 Personal history of antineoplastic chemotherapy: Secondary | ICD-10-CM | POA: Insufficient documentation

## 2021-07-03 DIAGNOSIS — Z923 Personal history of irradiation: Secondary | ICD-10-CM | POA: Insufficient documentation

## 2021-07-03 DIAGNOSIS — Z800 Family history of malignant neoplasm of digestive organs: Secondary | ICD-10-CM | POA: Insufficient documentation

## 2021-07-03 DIAGNOSIS — F17210 Nicotine dependence, cigarettes, uncomplicated: Secondary | ICD-10-CM | POA: Insufficient documentation

## 2021-07-03 DIAGNOSIS — Z8249 Family history of ischemic heart disease and other diseases of the circulatory system: Secondary | ICD-10-CM | POA: Insufficient documentation

## 2021-07-03 DIAGNOSIS — J449 Chronic obstructive pulmonary disease, unspecified: Secondary | ICD-10-CM | POA: Insufficient documentation

## 2021-07-03 DIAGNOSIS — Z170 Estrogen receptor positive status [ER+]: Secondary | ICD-10-CM | POA: Insufficient documentation

## 2021-07-03 DIAGNOSIS — C50412 Malignant neoplasm of upper-outer quadrant of left female breast: Secondary | ICD-10-CM | POA: Insufficient documentation

## 2021-07-03 DIAGNOSIS — Z8042 Family history of malignant neoplasm of prostate: Secondary | ICD-10-CM | POA: Insufficient documentation

## 2021-07-03 DIAGNOSIS — R590 Localized enlarged lymph nodes: Secondary | ICD-10-CM | POA: Insufficient documentation

## 2021-07-03 DIAGNOSIS — D509 Iron deficiency anemia, unspecified: Secondary | ICD-10-CM | POA: Insufficient documentation

## 2021-07-03 DIAGNOSIS — Z8616 Personal history of COVID-19: Secondary | ICD-10-CM | POA: Insufficient documentation

## 2021-07-03 DIAGNOSIS — Z79899 Other long term (current) drug therapy: Secondary | ICD-10-CM | POA: Insufficient documentation
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
## 2021-07-05 ENCOUNTER — Inpatient Hospital Stay (HOSPITAL_BASED_OUTPATIENT_CLINIC_OR_DEPARTMENT_OTHER): Payer: Medicare Other | Admitting: Adult Health

## 2021-07-05 ENCOUNTER — Other Ambulatory Visit (HOSPITAL_COMMUNITY): Payer: Self-pay

## 2021-07-05 ENCOUNTER — Inpatient Hospital Stay: Payer: Medicare Other

## 2021-07-05 ENCOUNTER — Encounter: Payer: Self-pay | Admitting: Adult Health

## 2021-07-05 ENCOUNTER — Other Ambulatory Visit: Payer: Self-pay

## 2021-07-05 VITALS — BP 139/80 | HR 109 | Temp 97.8°F | Resp 20 | Ht 70.0 in | Wt 293.4 lb

## 2021-07-05 DIAGNOSIS — Z170 Estrogen receptor positive status [ER+]: Secondary | ICD-10-CM

## 2021-07-05 DIAGNOSIS — Z923 Personal history of irradiation: Secondary | ICD-10-CM | POA: Diagnosis not present

## 2021-07-05 DIAGNOSIS — D509 Iron deficiency anemia, unspecified: Secondary | ICD-10-CM | POA: Diagnosis not present

## 2021-07-05 DIAGNOSIS — J449 Chronic obstructive pulmonary disease, unspecified: Secondary | ICD-10-CM | POA: Diagnosis not present

## 2021-07-05 DIAGNOSIS — Z8616 Personal history of COVID-19: Secondary | ICD-10-CM | POA: Diagnosis not present

## 2021-07-05 DIAGNOSIS — R590 Localized enlarged lymph nodes: Secondary | ICD-10-CM | POA: Diagnosis not present

## 2021-07-05 DIAGNOSIS — C50412 Malignant neoplasm of upper-outer quadrant of left female breast: Secondary | ICD-10-CM

## 2021-07-05 DIAGNOSIS — Z23 Encounter for immunization: Secondary | ICD-10-CM | POA: Diagnosis not present

## 2021-07-05 DIAGNOSIS — Z9221 Personal history of antineoplastic chemotherapy: Secondary | ICD-10-CM | POA: Diagnosis not present

## 2021-07-05 DIAGNOSIS — Z79899 Other long term (current) drug therapy: Secondary | ICD-10-CM | POA: Diagnosis not present

## 2021-07-05 DIAGNOSIS — F17210 Nicotine dependence, cigarettes, uncomplicated: Secondary | ICD-10-CM | POA: Diagnosis not present

## 2021-07-05 DIAGNOSIS — Z8249 Family history of ischemic heart disease and other diseases of the circulatory system: Secondary | ICD-10-CM | POA: Diagnosis not present

## 2021-07-05 DIAGNOSIS — Z8042 Family history of malignant neoplasm of prostate: Secondary | ICD-10-CM | POA: Diagnosis not present

## 2021-07-05 DIAGNOSIS — Z800 Family history of malignant neoplasm of digestive organs: Secondary | ICD-10-CM | POA: Diagnosis not present

## 2021-07-05 LAB — RETIC PANEL
Immature Retic Fract: 29.6 % — ABNORMAL HIGH (ref 2.3–15.9)
RBC.: 4.57 MIL/uL (ref 3.87–5.11)
Retic Count, Absolute: 57.1 10*3/uL (ref 19.0–186.0)
Retic Ct Pct: 1.3 % (ref 0.4–3.1)
Reticulocyte Hemoglobin: 21.5 pg — ABNORMAL LOW (ref >27.9)

## 2021-07-05 LAB — CMP (CANCER CENTER ONLY)
ALT: <6 U/L (ref 0–44)
AST: 10 U/L — ABNORMAL LOW (ref 15–41)
Albumin: 4 g/dL (ref 3.5–5.0)
Alkaline Phosphatase: 68 U/L (ref 38–126)
Anion gap: 9 (ref 5–15)
BUN: 7 mg/dL (ref 6–20)
CO2: 24 mmol/L (ref 22–32)
Calcium: 9.2 mg/dL (ref 8.9–10.3)
Chloride: 105 mmol/L (ref 98–111)
Creatinine: 1.02 mg/dL — ABNORMAL HIGH (ref 0.44–1.00)
GFR, Estimated: >60 mL/min (ref >60)
Glucose, Bld: 92 mg/dL (ref 70–99)
Potassium: 3.7 mmol/L (ref 3.5–5.1)
Sodium: 138 mmol/L (ref 135–145)
Total Bilirubin: 0.3 mg/dL (ref 0.3–1.2)
Total Protein: 7.7 g/dL (ref 6.5–8.1)

## 2021-07-05 LAB — CBC WITH DIFFERENTIAL (CANCER CENTER ONLY)
Abs Immature Granulocytes: 0.03 10*3/uL (ref 0.00–0.07)
Basophils Absolute: 0 10*3/uL (ref 0.0–0.1)
Basophils Relative: 1 %
Eosinophils Absolute: 0 10*3/uL (ref 0.0–0.5)
Eosinophils Relative: 0 %
HCT: 30 % — ABNORMAL LOW (ref 36.0–46.0)
Hemoglobin: 9.1 g/dL — ABNORMAL LOW (ref 12.0–15.0)
Immature Granulocytes: 1 %
Lymphocytes Relative: 34 %
Lymphs Abs: 1.8 10*3/uL (ref 0.7–4.0)
MCH: 21.6 pg — ABNORMAL LOW (ref 26.0–34.0)
MCHC: 30.3 g/dL (ref 30.0–36.0)
MCV: 71.3 fL — ABNORMAL LOW (ref 80.0–100.0)
Monocytes Absolute: 0.5 10*3/uL (ref 0.1–1.0)
Monocytes Relative: 10 %
Neutro Abs: 2.9 10*3/uL (ref 1.7–7.7)
Neutrophils Relative %: 54 %
Platelet Count: 292 10*3/uL (ref 150–400)
RBC: 4.21 MIL/uL (ref 3.87–5.11)
RDW: 19.4 % — ABNORMAL HIGH (ref 11.5–15.5)
WBC Count: 5.4 10*3/uL (ref 4.0–10.5)
nRBC: 0 % (ref 0.0–0.2)

## 2021-07-05 LAB — IRON AND TIBC
Iron: 16 ug/dL — ABNORMAL LOW (ref 41–142)
Saturation Ratios: 3 % — ABNORMAL LOW (ref 21–57)
TIBC: 511 ug/dL — ABNORMAL HIGH (ref 236–444)
UIBC: 495 ug/dL — ABNORMAL HIGH (ref 120–384)

## 2021-07-05 LAB — FERRITIN: Ferritin: <4 ng/mL — ABNORMAL LOW (ref 11–307)

## 2021-07-05 MED ORDER — PNEUMOCOCCAL 20-VAL CONJ VACC 0.5 ML IM SUSY
0.5000 mL | PREFILLED_SYRINGE | Freq: Once | INTRAMUSCULAR | Status: AC
  Administered 2021-07-05: 0.5 mL via INTRAMUSCULAR
  Filled 2021-07-05: qty 0.5

## 2021-07-05 MED ORDER — BOOSTRIX 5-2.5-18.5 LF-MCG/0.5 IM SUSY
PREFILLED_SYRINGE | INTRAMUSCULAR | 0 refills | Status: DC
  Filled 2021-07-05: qty 0.5, 1d supply, fill #0

## 2021-07-05 NOTE — Patient Instructions (Signed)
Iron Sucrose Injection What is this medication? IRON SUCROSE (EYE ern SOO krose) treats low levels of iron (iron deficiency anemia) in people with kidney disease. Iron is a mineral that plays an important role in making red blood cells, which carry oxygen from your lungs to the rest of your body. This medicine may be used for other purposes; ask your health care provider or pharmacist if you have questions. COMMON BRAND NAME(S): Venofer What should I tell my care team before I take this medication? They need to know if you have any of these conditions: Anemia not caused by low iron levels Heart disease High levels of iron in the blood Kidney disease Liver disease An unusual or allergic reaction to iron, other medications, foods, dyes, or preservatives Pregnant or trying to get pregnant Breast-feeding How should I use this medication? This medication is for infusion into a vein. It is given in a hospital or clinic setting. Talk to your care team about the use of this medication in children. While this medication may be prescribed for children as young as 2 years for selected conditions, precautions do apply. Overdosage: If you think you have taken too much of this medicine contact a poison control center or emergency room at once. NOTE: This medicine is only for you. Do not share this medicine with others. What if I miss a dose? It is important not to miss your dose. Call your care team if you are unable to keep an appointment. What may interact with this medication? Do not take this medication with any of the following: Deferoxamine Dimercaprol Other iron products This medication may also interact with the following: Chloramphenicol Deferasirox This list may not describe all possible interactions. Give your health care provider a list of all the medicines, herbs, non-prescription drugs, or dietary supplements you use. Also tell them if you smoke, drink alcohol, or use illegal drugs.  Some items may interact with your medicine. What should I watch for while using this medication? Visit your care team regularly. Tell your care team if your symptoms do not start to get better or if they get worse. You may need blood work done while you are taking this medication. You may need to follow a special diet. Talk to your care team. Foods that contain iron include: whole grains/cereals, dried fruits, beans, or peas, leafy green vegetables, and organ meats (liver, kidney). What side effects may I notice from receiving this medication? Side effects that you should report to your care team as soon as possible: Allergic reactions-skin rash, itching, hives, swelling of the face, lips, tongue, or throat Low blood pressure-dizziness, feeling faint or lightheaded, blurry vision Shortness of breath Side effects that usually do not require medical attention (report to your care team if they continue or are bothersome): Flushing Headache Joint pain Muscle pain Nausea Pain, redness, or irritation at injection site This list may not describe all possible side effects. Call your doctor for medical advice about side effects. You may report side effects to FDA at 1-800-FDA-1088. Where should I keep my medication? This medication is given in a hospital or clinic and will not be stored at home. NOTE: This sheet is a summary. It may not cover all possible information. If you have questions about this medicine, talk to your doctor, pharmacist, or health care provider.  2022 Elsevier/[employer] Standard (2021-01-03 12:52:06) Iron Deficiency Anemia, Adult Iron deficiency anemia is when you do not have enough red blood cells or hemoglobin in your blood. This  happens because you have too little iron in your body. Hemoglobin carries oxygen to parts of the body. Anemia can cause your body to not get enough oxygen. What are the causes? Not eating enough foods that have iron in them. The body not being able to  take in iron well. Needing more iron due to pregnancy or heavy menstrual periods, for females. Cancer. Bleeding in the bowels. Many blood draws. What increases the risk? Being pregnant. Being a teenage girl going through a growth spurt. What are the signs or symptoms? Pale skin, lips, and nails. Weakness, dizziness, and getting tired easily. Headache. Feeling like you cannot breathe well when moving (shortness of breath). Cold hands and feet. Fast heartbeat or a heartbeat that is not regular. Feeling grouchy (irritable) or breathing fast. These are more common in very bad anemia. Mild anemia may not cause any symptoms. How is this treated? This condition is treated by finding out why you do not have enough iron and then getting more iron. It may include: Adding foods to your diet that have a lot of iron. Taking iron pills (supplements). If you are pregnant or breastfeeding, you may need to take extra iron. Your diet often does not provide the amount of iron that you need. Getting more vitamin C in your diet. Vitamin C helps your body take in iron. You may need to take iron pills with a glass of orange juice or vitamin C pills. Medicines to make heavy menstrual periods lighter. Surgery. You may need blood tests to see if treatment is working. If the treatment does not seem to be working, you may need more tests. Follow these instructions at home: Medicines Take over-the-counter and prescription medicines only as told by your doctor. This includes iron pills and vitamins. Take iron pills when your stomach is empty. If you cannot handle this, take them with food. Do not drink milk or take antacids at the same time as your iron pills. Iron pills may turn your poop (stool)black. If you cannot handle taking iron pills by mouth, ask your doctor about getting iron through: An IV tube. A shot (injection) into a muscle. Eating and drinking  Talk with your doctor before changing the foods  you eat. He or she may tell you to eat foods that have a lot of iron, such as: Liver. Low-fat (lean) beef. Breads and cereals that have iron added to them. Eggs. Dried fruit. Dark green, leafy vegetables. Eat fresh fruits and vegetables that are high in vitamin C. They help your body use iron. Foods with a lot of vitamin C include: Oranges. Peppers. Tomatoes. Mangoes. Drink enough fluid to keep your pee (urine) pale yellow. Managing constipation If you are taking iron pills, they may cause trouble pooping (constipation). To prevent or treat trouble pooping, you may need to: Take over-the-counter or prescription medicines. Eat foods that are high in fiber. These include beans, whole grains, and fresh fruits and vegetables. Limit foods that are high in fat and sugar. These include fried or sweet foods. General instructions Return to your normal activities as told by your doctor. Ask your doctor what activities are safe for you. Keep yourself clean, and keep things clean around you. Keep all follow-up visits as told by your doctor. This is important. Contact a doctor if: You feel like you may vomit (nauseous), or you vomit. You feel weak. You are sweating for no reason. You have trouble pooping, such as: Pooping less than 3 times a week. Straining  to poop. Having poop that is hard, dry, or larger than normal. Feeling full or bloated. Pain in the lower belly. Not feeling better after pooping. Get help right away if: You pass out (faint). You have chest pain. You have trouble breathing that: Is very bad. Gets worse with physical activity. You have a fast heartbeat, or a heartbeat that does not feel regular. You get light-headed when getting up from sitting or lying down. These symptoms may be an emergency. Do not wait to see if the symptoms will go away. Get medical help right away. Call your local emergency services (911 in the [place] Do not drive yourself to the  hospital. Summary Iron deficiency anemia is when you have too little iron in your body. This condition is treated by finding out why you do not have enough iron in your body and then getting more iron. Take over-the-counter and prescription medicines only as told by your doctor. Eat fresh fruits and vegetables that are high in vitamin C. Get help right away if you cannot breathe well. This information is not intended to replace advice given to you by your health care provider. Make sure you discuss any questions you have with your health care provider. Document Revised: 06/16/2019 Document Reviewed: 06/16/2019 Elsevier Patient Education  Short.

## 2021-07-05 NOTE — Progress Notes (Addendum)
[place]  Telephone:([phone]) [phone] Fax:(336) [phone]    ID: Kimberly [name] DOB: 08-17-1971  MR#: [id number]  BPP#:[id number]  Patient Care Team: Kimberly Del, DO as PCP - General (Family Medicine) Kimberly [name], Kimberly Dad, [name] as Consulting Physician (Oncology) Kimberly Gibson, [name] as Attending Physician (Radiation Oncology) Kimberly Bookbinder, [name] as Consulting Physician (General Surgery) Kimberly [name], Kimberly Massed, NP as Nurse Practitioner (Hematology and Oncology) Kimberly Hun. Kimberly [name]  CHIEF COMPLAINT: Estrogen receptor positive breast cancer  CURRENT TREATMENT: none currently  BREAST CANCER HISTORY: From the original intake note:  The patient had bilateral screening mammography with tomography 03/19/2014. This was the patient's first ever mammography. Showed a possible mass in the left breast. Left diagnostic mammography and ultrasonography 04/01/2014 showed an irregular mass in the upper outer quadrant of the left breast with a possible satellite 1 cm anterior to it. On physical exam there was a firm palpable mass at the 1:00 position of the left breast 8 cm from the nipple. There was no palpable left axillary adenopathy. Ultrasound showed an irregular hypoechoic mass in the area in question measuring 1.4 cm. There was a 4 mm nodule located anterior to this. Ultrasound of the left axilla was benign.  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 The second mass was negative for malignancy. This was felt to be concordant.  On 04/08/2014 the patient underwent bilateral breast MRI. This showed a 9 mm enhancing mass in the subareolar area of the right breast in in the left breast, the  previously noted mass measuring 1.3 cm. There were no morphologically abnormal lymph nodes  The right breast finding was followed up on 04/19/2014 with ultrasound which showed an intraductal soft tissue mass in the inferior subareolar worsening of the right breast measuring 8 mm. This was biopsied 04/19/2014 and showed ([id number]) ectatic duct, with no evidence of malignancy. Surgical excision was recommended.  Accordingly on 05/03/2014 the patient underwent right lumpectomy, showing an intraductal papilloma, with known malignancy identified. Left lumpectomy on the same day showed an invasive ductal carcinoma measuring 1.5 cm, with one of the 2 sentinel lymph nodes positive for carcinoma. There was no extracapsular extension. Margins were clear and ample. HER-2 was repeated and was again negative.  The patient's case was discussed in the multidisciplinary breast cancer conference 05/12/2014. An Oncotype had been previously requested and this showed a recurrent score of 22, in the intermediate range. The patient qualifies for the S1007 study and this will be discussed with her. Otherwise the standard recommendation would be chemotherapy followed by radiation followed by anti-estrogens.  The patient's subsequent history is as detailed below.  INTERVAL HISTORY: Kimberly [name] returns today for follow-up of her breast cancer unaccompanied.  She is unsure why she is here, and notes that she hasn't been here in 6-7 years.  She was lost to f/u.  She underwent a mammogram on 05/24/2021 and it showed a complex fluid collection in the right axilla, ? Abscess, and breast density C she was referred to Kimberly [name].  There was no sign of malignancy. She was also noted to have superficial skin lesions in the upper left and outer breast by radiology and they recommended she see dermatology for a biopsy.  She says her breast has become very hard over the past 7-8 months.  This is new.  She says it feels like a brick and  it wasn't previously like that.   She saw Kimberly [name] on 06/23/2021 and he recommended excision of the right axillar mass and left breast skin punch biopsy.  This has not yet been scheduled.    She underwent CT A/P on 06/10/2021, she was having blood in her stool  She went to the ER and CT a/p was negative.  She says she was referred to GI, but it hasn't yet been scheduled.  She is very fatigued.  Her hemoglobin has been progressively worsening since June 2020.    Results for Kimberly [name] (MRN [id number]) as of 07/05/2021 10:44  Ref. Range 04/18/2019 03:27 08/23/2020 23:45 08/24/2020 05:50 08/25/2020 05:43 01/04/2021 12:32 06/10/2021 16:34 06/10/2021 17:36 07/05/2021 09:39  Hemoglobin Latest Ref Range: 12.0 - 15.0 g/dL 12.4 10.5 (L) 10.3 (L) 9.4 (L) 10.8 (L) 9.5 (L) 10.9 (L) 9.1 (L)   REVIEW OF SYSTEMS: Review of Systems  Constitutional:  Positive for fatigue. Negative for appetite change, chills, fever and unexpected weight change.  HENT:   Negative for hearing loss, lump/mass and trouble swallowing.   Eyes:  Negative for eye problems and icterus.  Respiratory:  Negative for chest tightness, cough and shortness of breath.   Cardiovascular:  Negative for chest pain, leg swelling and palpitations.  Gastrointestinal:  Negative for abdominal distention, abdominal pain, constipation, diarrhea, nausea and vomiting.  Endocrine: Negative for hot flashes.  Genitourinary:  Negative for difficulty urinating.   Musculoskeletal:  Negative for arthralgias.  Skin:  Negative for itching and rash.  Neurological:  Negative for dizziness, extremity weakness, headaches and numbness.  Hematological:  Negative for adenopathy. Does not bruise/bleed easily.  Psychiatric/Behavioral:  Negative for depression. The patient is not nervous/anxious.     PAST MEDICAL HISTORY: Past Medical History:  Diagnosis Date   Acute cholecystitis 04/17/2019   Anemia    Arthritis    Bipolar 1 disorder (Kimberly [name])    Blood transfusion  without reported diagnosis 2007   post bleeding childbirth   COPD (chronic obstructive pulmonary disease) ([place])    COVID-19 virus infection 06/07/2019   Diabetes mellitus without complication ([facility])    gestational   Hand injury, right, initial encounter 03/19/2019   Heart murmur    as a child-not adult-no cardiac work up   History of radiation therapy 08/31/14-10/21/14   left breast/ left supraclavicular 50.4 Gy 28 fx, lef tposterior axillary boost 9.52 Gy 28 fx, left rbeast boost/ 10 Gy 5 fx   Personal history of chemotherapy    Personal history of radiation therapy    Rash and nonspecific skin eruption 12/27/2020    PAST SURGICAL HISTORY: Past Surgical History:  Procedure Laterality Date   BREAST BIOPSY Left 04/01/14   BREAST BIOPSY Right 04/19/14   BREAST LUMPECTOMY WITH AXILLARY LYMPH NODE DISSECTION Bilateral 05/03/14   CHOLECYSTECTOMY N/A 04/18/2019   Procedure: LAPAROSCOPIC CHOLECYSTECTOMY WITH INTRAOPERATIVE CHOLANGIOGRAM;  Surgeon: Jovita Kussmaul, [name];  Location: [facility];  Service: General;  Laterality: N/A;   DILATION AND CURETTAGE OF UTERUS     after childbirth   MULTIPLE TOOTH EXTRACTIONS     only 5 left   PORTACATH PLACEMENT Right 06/18/2014   Procedure: INSERTION PORT-A-CATH;  Surgeon: Kimberly Bookbinder, [name];  Location: [facility];  Service: General;  Laterality: Right;   TONSILLECTOMY AND ADENOIDECTOMY      FAMILY HISTORY Family History  Problem Relation Age of Onset   Heart disease Father    Cancer Father        Prostate   Colon cancer Maternal Grandfather    Colon cancer Paternal Grandmother    Stomach cancer Neg Hx    Esophageal cancer Neg Hx    The patient's father is currently living at age 38. The patient's mother was murdered at age 26 by the patient's mother's sister. The patient has 2 brothers, one sister. The patient's father hase prostate cancer, diagnosed at age 14. There is no history of breast or ovarian cancer in the family.  GYNECOLOGIC  HISTORY:  No LMP recorded. (Menstrual status: Perimenopausal). Menarche age 8, first live birth age 52, the patient is [place] P5. The patient has not had a period in the last 2 months but tells me she has tested for pregnancy x2 and she is not pregnant.  SOCIAL HISTORY:   the patient tells me she is disabled secondary to a nervous breakdown. She is widowed to her husband Luiz Ochoa, who passed away 6 years ago.  She lives at home with her children, ages 70, 1, and 10.  Her son was recently named valedictorian of his high school class.    ADVANCED DIRECTIVES:  not in place    HEALTH MAINTENANCE: Social History   Tobacco Use   Smoking status: Every Day    Packs/day: 1.00    Years: 34.00    Pack years: 34.00    Types: Cigarettes   Smokeless tobacco: Never  Vaping Use   Vaping Use: Never used  Substance Use Topics   Alcohol use: No   Drug use: No     Colonoscopy:  PAP:  Bone density:  Lipid panel:  No Known Allergies  Current Outpatient Medications  Medication Sig Dispense Refill   acetaminophen (TYLENOL) 325 MG tablet Take 2 tablets (650 mg total) by mouth every 6 (six) hours as needed. (Patient not taking: No sig reported) 30 tablet 0   albuterol (VENTOLIN HFA) 108 (90 Base) MCG/ACT inhaler Inhale 2 puffs into the lungs every 6 (six) hours as needed for wheezing or shortness of breath. 20.1 g 1   busPIRone (BUSPAR) 15 MG tablet Take 15 mg by mouth 3 (three) times daily.     doxepin (SINEQUAN) 75 MG capsule Take 75 mg by mouth at bedtime.     hydrocortisone 2.5 % ointment Apply topically 2 (two) times daily. (Patient not taking: No sig reported) 30 g 0   ibuprofen (ADVIL) 600 MG tablet Take 1 tablet (600 mg total) by mouth every 6 (six) hours as needed. (Patient taking differently: Take 600 mg by mouth every 6 (six) hours as needed for moderate pain.) 30 tablet 0   ipratropium-albuterol (DUONEB) 0.5-2.5 (3) MG/3ML SOLN Take 3 mLs by nebulization every 4 (four) hours as needed. 360 mL  3   mometasone-formoterol (DULERA) 200-5 MCG/ACT AERO Inhale 2 puffs into the lungs 2 (two) times daily. 1 each    montelukast (SINGULAIR) 10 MG tablet Take 1 tablet (10 mg total) by mouth at bedtime. 30 tablet 3   nystatin (MYCOSTATIN) 100000 UNIT/ML suspension Take 5 mLs (500,000 Units total) by mouth 4 (four) times daily. Swish and swallow x 7-14 days. Retain in mouth as long as possible (Patient taking differently: Take 500,000 Units by mouth 4 (four) times daily as needed (thrush). Swish and swallow x 7-14 days. Retain in mouth as long as possible) 280 mL 0   pantoprazole (PROTONIX) 40 MG tablet  Take 1 tablet (40 mg total) by mouth daily. 30 tablet 0   prazosin (MINIPRESS) 1 MG capsule Take 1 mg by mouth at bedtime.     QUEtiapine (SEROQUEL) 400 MG tablet Take 400 mg by mouth at bedtime.      rosuvastatin (CRESTOR) 10 MG tablet Take 1 tablet (10 mg total) by mouth daily. (Patient taking differently: Take 10 mg by mouth at bedtime.) 90 tablet 1   Spacer/Aero-Holding Chambers (AEROCHAMBER PLUS) inhaler Use with inhaler 1 each 2   Tdap (BOOSTRIX) 5-2.5-18.5 LF-MCG/0.5 injection Inject into the muscle. 0.5 mL 0   Tiotropium Bromide Monohydrate (SPIRIVA RESPIMAT) 1.25 MCG/ACT AERS Inhale 2 puffs into the lungs daily. 4 each 0   Tiotropium Bromide Monohydrate (SPIRIVA RESPIMAT) 1.25 MCG/ACT AERS Inhale 2 puffs into the lungs daily.     Current Facility-Administered Medications  Medication Dose Route Frequency Provider Last Rate Last Admin   polymixin-bacitracin (POLYSPORIN) ointment   Topical BID Brimage, Vondra, DO        OBJECTIVE: middle-aged [place] woman who appears older than stated age Vitals:   07/05/21 0954  BP: 139/80  Pulse: (!) 109  Resp: 20  Temp: 97.8 F (36.6 C)  SpO2: 100%     Body mass index is 42.1 kg/m.    ECOG FS:2 - Symptomatic, <50% confined to bed GENERAL: Patient is a well appearing female in no acute distress HEENT:  Sclerae anicteric.  Oropharynx clear and moist.  No ulcerations or evidence of oropharyngeal candidiasis. Neck is supple.  NODES:  No cervical, supraclavicular, or axillary lymphadenopathy palpated.  BREAST EXAM:  right breast benign, small area of about 1cm in right axilla, left breast has thickened skin throughout, and subcutaneous nodules are noted over breast.   LUNGS:  Clear to auscultation bilaterally.  No wheezes or rhonchi. HEART:  Regular rate and rhythm. No murmur appreciated. ABDOMEN:  Soft, nontender.  Positive, normoactive bowel sounds. No organomegaly palpated. MSK:  No focal spinal tenderness to palpation. Full range of motion bilaterally in the upper extremities. EXTREMITIES:  No peripheral edema.   SKIN:  Clear with no obvious rashes or skin changes. No nail dyscrasia. NEURO:  Nonfocal. Well oriented.  Appropriate affect.  LAB RESULTS:  CMP     Component Value Date/Time   NA 138 07/05/2021 0939   NA 138 03/10/2020 1534   NA 139 01/19/2015 1422   K 3.7 07/05/2021 0939   K 3.8 01/19/2015 1422   CL 105 07/05/2021 0939   CO2 24 07/05/2021 0939   CO2 23 01/19/2015 1422   GLUCOSE 92 07/05/2021 0939   GLUCOSE 93 01/19/2015 1422   BUN 7 07/05/2021 0939   BUN 8 03/10/2020 1534   BUN 8.6 01/19/2015 1422   CREATININE 1.02 (H) 07/05/2021 0939   CREATININE 1.1 01/19/2015 1422   CALCIUM 9.2 07/05/2021 0939   CALCIUM 9.3 01/19/2015 1422   PROT 7.7 07/05/2021 0939   PROT 7.2 02/11/2020 1626   PROT 7.3 01/19/2015 1422   ALBUMIN 4.0 07/05/2021 0939   ALBUMIN 4.4 02/11/2020 1626   ALBUMIN 3.9 01/19/2015 1422   AST 10 (L) 07/05/2021 0939   AST 14 01/19/2015 1422   ALT <6 07/05/2021 0939   ALT 13 01/19/2015 1422   ALKPHOS 68 07/05/2021 0939   ALKPHOS 82 01/19/2015 1422   BILITOT 0.3 07/05/2021 0939   BILITOT 0.35 01/19/2015 1422   GFRNONAA >60 07/05/2021 0939   GFRAA 76 03/10/2020 1534    I No results found for: SPEP  Lab  Results  Component Value Date   WBC 5.4 07/05/2021   NEUTROABS 2.9 07/05/2021   HGB 9.1  (L) 07/05/2021   HCT 30.0 (L) 07/05/2021   MCV 71.3 (L) 07/05/2021   PLT 292 07/05/2021      Chemistry      Component Value Date/Time   NA 138 07/05/2021 0939   NA 138 03/10/2020 1534   NA 139 01/19/2015 1422   K 3.7 07/05/2021 0939   K 3.8 01/19/2015 1422   CL 105 07/05/2021 0939   CO2 24 07/05/2021 0939   CO2 23 01/19/2015 1422   BUN 7 07/05/2021 0939   BUN 8 03/10/2020 1534   BUN 8.6 01/19/2015 1422   CREATININE 1.02 (H) 07/05/2021 0939   CREATININE 1.1 01/19/2015 1422      Component Value Date/Time   CALCIUM 9.2 07/05/2021 0939   CALCIUM 9.3 01/19/2015 1422   ALKPHOS 68 07/05/2021 0939   ALKPHOS 82 01/19/2015 1422   AST 10 (L) 07/05/2021 0939   AST 14 01/19/2015 1422   ALT <6 07/05/2021 0939   ALT 13 01/19/2015 1422   BILITOT 0.3 07/05/2021 0939   BILITOT 0.35 01/19/2015 1422       Lab Results  Component Value Date   LABCA2 28 04/30/2014    No components found for: [id number]  No results for input(s): INR in the last 168 hours.  Urinalysis    Component Value Date/Time   COLORURINE YELLOW 03/07/2017 2111   APPEARANCEUR CLEAR 03/07/2017 2111   LABSPEC 1.016 03/07/2017 2111   PHURINE 7.0 03/07/2017 2111   GLUCOSEU NEGATIVE 03/07/2017 2111   HGBUR NEGATIVE 03/07/2017 2111   BILIRUBINUR negative 03/10/2020 1515   KETONESUR negative 03/10/2020 1515   [place] 03/07/2017 2111   PROTEINUR =30 (A) 03/10/2020 1515   PROTEINUR NEGATIVE 03/07/2017 2111   UROBILINOGEN 0.2 03/10/2020 1515   UROBILINOGEN 0.2 07/10/2014 1951   NITRITE Negative 03/10/2020 1515   NITRITE NEGATIVE 03/07/2017 2111   LEUKOCYTESUR Negative 03/10/2020 1515    STUDIES: CT ABDOMEN PELVIS W CONTRAST  Result Date: 06/10/2021 CLINICAL DATA:  Blood clots in the stool. Generalized abdominal pain. EXAM: CT ABDOMEN AND PELVIS WITH CONTRAST TECHNIQUE: Multidetector CT imaging of the abdomen and pelvis was performed using the standard protocol following bolus administration of  intravenous contrast. CONTRAST:  25m OMNIPAQUE IOHEXOL 350 MG/ML SOLN COMPARISON:  None. FINDINGS: Lower chest: No acute abnormality. Hepatobiliary: Liver normal in size and overall attenuation. Multiple small low-attenuation lesions, most too small to characterize. There is a 7 mm lesion the dome of the right lobe and a 9 mm low-density lesion the inferior margin of the right lobe. The larger lesions are consistent with cysts and all of the lesions are likely cysts. Gallbladder surgically absent. No bile duct dilation. Pancreas: Unremarkable. No pancreatic ductal dilatation or surrounding inflammatory changes. Spleen: Normal in size without focal abnormality. Adrenals/Urinary Tract: Adrenal glands are unremarkable. Kidneys are normal, without renal calculi, focal lesion, or hydronephrosis. Bladder is unremarkable. Stomach/Bowel: Normal stomach. Small bowel and colon are normal in caliber. No wall thickening. No inflammation. No evidence of a mass. No findings to account for GI bleeding. Normal appendix visualized. Vascular/Lymphatic: Mild aortic atherosclerosis. No aneurysm. No enlarged lymph nodes. Reproductive: Uterus and bilateral adnexa are unremarkable. Other: No abdominal wall hernia or abnormality. No abdominopelvic ascites. Musculoskeletal: No fracture or acute finding.  No bone lesion. IMPRESSION: 1. No acute findings within the abdomen or pelvis. 2. No bowel abnormality to account for GI bleeding. 3.  Small low-attenuation liver lesions, most likely cysts, not fully characterized. 4. Mild aortic atherosclerosis. Electronically Signed   By: Lajean Manes M.D.   On: 06/10/2021 19:45     ASSESSMENT: 50 y.o. BRCA negative [place] woman status post left lumpectomy and sentinel lymph node sampling 05/03/2014 for a pT1c pN0, stage IA invasive ductal carcinoma, grade 2, estrogen receptor 83% positive, progesterone receptor 66% positive, with an MIB-1 of 14% and no HER-2 amplification.  (1) Oncotype score  of 22 predicts a risk of outside the breast recurrence within 10 years of 13% if the patient's only systemic therapy is tamoxifen for 5 years.  (2) adjuvant chemotherapy with cyclophosphamide and docetaxel started 07/06/2014, patient tolerated chemotherapy very poorly and it was discontinued after one cycle.    (3) adjuvant radiation completed 10/21/2014 1) Left Breast / 50.4 Gy in 28 fractions 2) Left supraclavicular / 50.4 Gy in 28 fractions 3) Left Posterior axillary boost  / 9.52 Gy in 28 fractions 4) Left Breast Boost / 10 Gy in 5 fractions  (4) anastrozole started 01/19/2015-[name] informed me she never started this.   METASTATIC DISEASE: September 2022 (5) mammography 05/24/2021 shows fluid collection in the right axilla, no obvious malignancy within each breast but superficial skin lesions throughout the upper outer left breast.  (A) CT scan of the abdomen and pelvis with contrast 06/10/2021 shows small low-attenuation liver lesions which could be cysts but no definite evidence of metastatic disease.  (B) chest CT scan  (C) CT head  (D) biopsy skin lesions and right axilla (E) CA 27-29   PLAN: [name] is here for two issues.  She met with Dr. Jana Hakim who reviewed both of these with her.   Likely recurrent breast cancer: She is waiting to be scheduled for biopsy and excision with Kimberly [name].  I sent him a message to inquire about this.  I placed orders for CA 27-29 and CT chest/ and CT head for staging.   Iron deficiency.  She says that she received a call about a gi referral and has not yet called them back.  I placed orders for iron studies and iron.   Vaccines: She received her PCV20 today, however I called our [facility] pharmacy to see if they could give her TDAP since that is due.  She would have a $47 copay, so she did not want to proceed with this today.  She is overdue for her pap smear also and I recommended she f/u with her PCP for this.    Burnanett will  proceed with the above.  She will f/u with Dr. Jana Hakim in 2 weeks to review the results.  She knows to call for any questions that may arise between now and her next appointment.  We are happy to see her sooner if needed.  Total encounter time: 60 minutes  Wilber Bihari, NP 07/06/21 10:40 PM Medical Oncology and Hematology [facility] [place], [place] [address] Tel. [phone]    Fax. [phone]   ADDENDUM: 49 y/o [place] woman a little more than 7 years out from initial diagnosis of left breast invasive ductal carcinoma, stage Ia, status post truncated  chemotherapy, adjuvant radiation, and noncompliant with antiestrogen; now with evidence of chest wall recurrence on the left side.  There is a fluid collection in the right axilla which may be unrelated, possibly a sebaceous cyst.  This as well as the left-sided skin biopsies are in process of biopsy.  We have a CT  of the abdomen and pelvis obtained 06/10/2021 which shows no evidence of metastatic disease although there are some subtle liver lesions that may require further evaluation.  We have set her up for CT of the chest and a CT of the brain and we will check a CA 27-29 at the next visit.  We will need a prognostic panel on the skin lesions before we can institute definitive treatment.  If this is strongly estrogen receptor positive she can be treated with anastrozole and abemaciclib.  She will return to see [name] next week to review results  I personally saw this patient and performed a substantive portion of this encounter with the listed APP documented above.   [name], [name] Medical Oncology and Hematology [facility] [address] [place], [place] [address] Tel. [phone]    Fax. [phone]   *Total Encounter Time as defined by the Centers for Medicare and Medicaid Services includes, in addition to the face-to-face time of a patient visit (documented in the note  above) non-face-to-face time: obtaining and reviewing outside history, ordering and reviewing medications, tests or procedures, care coordination (communications with other health care professionals or caregivers) and documentation in the medical record.

## 2021-07-06 ENCOUNTER — Encounter: Payer: Self-pay | Admitting: Adult Health

## 2021-07-06 LAB — CANCER ANTIGEN 27.29: CA 27.29: 21.1 U/mL (ref 0.0–38.6)

## 2021-07-14 ENCOUNTER — Other Ambulatory Visit: Payer: Self-pay

## 2021-07-14 ENCOUNTER — Ambulatory Visit (HOSPITAL_COMMUNITY)
Admission: RE | Admit: 2021-07-14 | Discharge: 2021-07-14 | Disposition: A | Discharge status: Home / Self Care | Payer: Medicare Other | Source: Ambulatory Visit | Attending: Oncology | Admitting: Oncology

## 2021-07-14 ENCOUNTER — Encounter (HOSPITAL_COMMUNITY): Payer: Self-pay

## 2021-07-14 DIAGNOSIS — D509 Iron deficiency anemia, unspecified: Secondary | ICD-10-CM | POA: Diagnosis not present

## 2021-07-14 DIAGNOSIS — D164 Benign neoplasm of bones of skull and face: Secondary | ICD-10-CM | POA: Diagnosis not present

## 2021-07-14 DIAGNOSIS — C50919 Malignant neoplasm of unspecified site of unspecified female breast: Secondary | ICD-10-CM | POA: Diagnosis not present

## 2021-07-14 DIAGNOSIS — Z170 Estrogen receptor positive status [ER+]: Secondary | ICD-10-CM | POA: Insufficient documentation

## 2021-07-14 DIAGNOSIS — I700 Atherosclerosis of aorta: Secondary | ICD-10-CM | POA: Diagnosis not present

## 2021-07-14 DIAGNOSIS — C50412 Malignant neoplasm of upper-outer quadrant of left female breast: Secondary | ICD-10-CM | POA: Diagnosis not present

## 2021-07-14 DIAGNOSIS — J449 Chronic obstructive pulmonary disease, unspecified: Secondary | ICD-10-CM | POA: Insufficient documentation

## 2021-07-14 MED ORDER — IOHEXOL 350 MG/ML SOLN
75.0000 mL | Freq: Once | INTRAVENOUS | Status: AC | PRN
  Administered 2021-07-14: 60 mL via INTRAVENOUS

## 2021-07-17 NOTE — Progress Notes (Signed)
[place]  [phone] Fax:([phone]    ID: Kimberly [name] DOB: 02-11-1971  MR#: [id number]  GEX#:[id number]  Patient Care Team: Lurline Del, DO as PCP - General (Family Medicine) Merric Yost, Virgie Dad, MD as Consulting Physician (Oncology) [name], MD as Attending Physician (Radiation Oncology) [name], MD as Consulting Physician (General Surgery) Causey, Charlestine Massed, NP as Nurse Practitioner (Hematology and Oncology) [name]  CHIEF COMPLAINT: Estrogen receptor positive breast cancer; iron deficiency  CURRENT TREATMENT: Venofer; surgery pending   INTERVAL HISTORY: [name] returns today for follow-up of her estrogen receptor positive breast cancer.  Since her last visit, she underwent staging head CT on 07/14/2021, which was negative.  She also underwent restaging chest CT the same day, which was also negative for metastatic disease.  Recall she had a CT of the abdomen and pelvis 06/18/2021 which again showed no evidence of metastatic disease.  She is scheduled for right axillary dissection on 07/25/2021 under Dr. Donne Hazel.  He is also planning to biopsy the lesions on the contralateral breast.  She is scheduled to meet with GI on 08/07/2021 to evaluate the cause of her anemia..  She is clearly iron deficient with a ferritin of less than 4 on 07/05/2021 and iron saturation 3%.  She is scheduled for the first of 5 weekly Venofer infusions today.  Lab Results  Component Value Date   HGB 9.1 (L) 07/05/2021   HGB 10.9 (L) 06/10/2021   HGB 9.5 (L) 06/10/2021   HGB 10.8 (L) 01/04/2021   HGB 9.4 (L) 08/25/2020    REVIEW OF SYSTEMS: Kimberly [name] generally feels at baseline, denies unusual headaches visual changes nausea vomiting cough phlegm production pleurisy or change in bowel or bladder habits.  She denies focal pain.  She is looking forward to her definitive surgery so we can figure out what she has" do what ever  treatment" is needed.  A detailed review of systems was otherwise stable   COVID 19 VACCINATION STATUS:    BREAST CANCER HISTORY: From the original intake note:  The patient had bilateral screening mammography with tomography 03/19/2014. This was the patient's first ever mammography. Showed a possible mass in the left breast. Left diagnostic mammography and ultrasonography 04/01/2014 showed an irregular mass in the upper outer quadrant of the left breast with a possible satellite 1 cm anterior to it. On physical exam there was a firm palpable mass at the 1:00 position of the left breast 8 cm from the nipple. There was no palpable left axillary adenopathy. Ultrasound showed an irregular hypoechoic mass in the area in question measuring 1.4 cm. There was a 4 mm nodule located anterior to this. Ultrasound of the left axilla was benign.  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 The second mass was negative for malignancy. This was felt to be concordant.  On 04/08/2014 the patient underwent bilateral breast MRI. This showed a 9 mm enhancing mass in the subareolar area of the right breast in in the left breast, the previously noted mass measuring 1.3 cm. There were no morphologically abnormal lymph nodes  The right breast finding was followed up on 04/19/2014 with ultrasound which showed an intraductal soft tissue mass in  the inferior subareolar worsening of the right breast measuring 8 mm. This was biopsied 04/19/2014 and showed ([id number]) ectatic duct, with no evidence of malignancy. Surgical excision was recommended.  Accordingly on 05/03/2014 the patient underwent right lumpectomy, showing an  intraductal papilloma, with known malignancy identified. Left lumpectomy on the same day showed an invasive ductal carcinoma measuring 1.5 cm, with one of the 2 sentinel lymph nodes positive for carcinoma. There was no extracapsular extension. Margins were clear and ample. HER-2 was repeated and was again negative.  The patient's case was discussed in the multidisciplinary breast cancer conference 05/12/2014. An Oncotype had been previously requested and this showed a recurrent score of 22, in the intermediate range. The patient qualifies for the S1007 study and this will be discussed with her. Otherwise the standard recommendation would be chemotherapy followed by radiation followed by anti-estrogens.  The patient's subsequent history is as detailed below.   PAST MEDICAL HISTORY: Past Medical History:  Diagnosis Date   Acute cholecystitis 04/17/2019   Anemia    Arthritis    Bipolar 1 disorder ([place])    Blood transfusion without reported diagnosis 2007   post bleeding childbirth   COPD (chronic obstructive pulmonary disease) ([place])    COVID-19 virus infection 06/07/2019   Diabetes mellitus without complication (HCC)    gestational   Hand injury, right, initial encounter 03/19/2019   Heart murmur    as a child-not adult-no cardiac work up   History of radiation therapy 08/31/14-10/21/14   left breast/ left supraclavicular 50.4 Gy 28 fx, lef tposterior axillary boost 9.52 Gy 28 fx, left rbeast boost/ 10 Gy 5 fx   Personal history of chemotherapy    Personal history of radiation therapy    Rash and nonspecific skin eruption 12/27/2020    PAST SURGICAL HISTORY: Past Surgical History:  Procedure Laterality Date   BREAST BIOPSY Left 04/01/14   BREAST BIOPSY Right 04/19/14   BREAST LUMPECTOMY WITH AXILLARY LYMPH NODE DISSECTION Bilateral 05/03/14   CHOLECYSTECTOMY N/A 04/18/2019   Procedure: LAPAROSCOPIC CHOLECYSTECTOMY WITH INTRAOPERATIVE CHOLANGIOGRAM;  Surgeon: Jovita Kussmaul, MD;  Location: [facility];  Service: General;  Laterality: N/A;   DILATION AND CURETTAGE OF UTERUS     after childbirth   MULTIPLE TOOTH EXTRACTIONS     only 5 left   PORTACATH PLACEMENT Right 06/18/2014   Procedure: INSERTION PORT-A-CATH;  Surgeon: [name], MD;  Location: [facility];  Service: General;  Laterality: Right;   TONSILLECTOMY AND ADENOIDECTOMY      FAMILY HISTORY Family History  Problem Relation Age of Onset   Heart disease Father    Cancer Father        Prostate   Colon cancer Maternal Grandfather    Colon cancer Paternal Grandmother    Stomach cancer Neg Hx    Esophageal cancer Neg Hx   The patient's father is currently living at age 24. The patient's mother was murdered at age 49 by the patient's mother's sister. The patient has 2 brothers, one sister. The patient's father hase prostate cancer, diagnosed at age 38. There is no history of breast or ovarian cancer in the family.   GYNECOLOGIC HISTORY:  Patient's last menstrual period was 07/13/2021. Menarche age 55, first live birth age 30, the patient is [place] P5. The patient has not had a period in the last 2 months but tells me she has tested for pregnancy x2 and she is not pregnant.   SOCIAL HISTORY:   the patient tells  me she is disabled secondary to a nervous breakdown. She is widowed to her husband Luiz Ochoa, who passed away 6 years ago.  She lives at home with her children, ages 33, 37, and 9.  Her son was recently named valedictorian of his high school class.    ADVANCED DIRECTIVES:  not in place    HEALTH MAINTENANCE: Social History   Tobacco Use   Smoking status: Every Day    Packs/day: 1.00    Years: 34.00    Pack years: 34.00    Types: Cigarettes   Smokeless tobacco: Never  Vaping Use   Vaping Use: Never used  Substance Use Topics   Alcohol use: No   Drug use: No     Colonoscopy:  PAP:  Bone density:  Lipid panel:  No Known Allergies  Current Outpatient Medications  Medication Sig  Dispense Refill   albuterol (VENTOLIN HFA) 108 (90 Base) MCG/ACT inhaler Inhale 2 puffs into the lungs every 6 (six) hours as needed for wheezing or shortness of breath. 20.1 g 1   busPIRone (BUSPAR) 15 MG tablet Take 15 mg by mouth 3 (three) times daily.     diphenhydramine-acetaminophen (TYLENOL PM) 25-500 MG TABS tablet Take 2 tablets by mouth at bedtime as needed (sleep).     doxepin (SINEQUAN) 75 MG capsule Take 75 mg by mouth at bedtime.     fluticasone (FLONASE) 50 MCG/ACT nasal spray Place 2 sprays into both nostrils daily.     mometasone-formoterol (DULERA) 200-5 MCG/ACT AERO Inhale 2 puffs into the lungs 2 (two) times daily. (Patient not taking: Reported on 07/12/2021) 1 each    montelukast (SINGULAIR) 10 MG tablet Take 1 tablet (10 mg total) by mouth at bedtime. 30 tablet 3   pantoprazole (PROTONIX) 40 MG tablet Take 1 tablet (40 mg total) by mouth daily. 30 tablet 0   prazosin (MINIPRESS) 1 MG capsule Take 1 mg by mouth at bedtime.     prednisoLONE acetate (PRED FORTE) 1 % ophthalmic suspension Place 1 drop into the left eye daily.     QUEtiapine (SEROQUEL) 400 MG tablet Take 400 mg by mouth at bedtime.      rosuvastatin (CRESTOR) 10 MG tablet Take 1 tablet (10 mg total) by mouth daily. (Patient taking differently: Take 10 mg by mouth at bedtime.) 90 tablet 1   Spacer/Aero-Holding Chambers (AEROCHAMBER PLUS) inhaler Use with inhaler 1 each 2   Tdap (BOOSTRIX) 5-2.5-18.5 LF-MCG/0.5 injection Inject into the muscle. 0.5 mL 0   Tiotropium Bromide Monohydrate (SPIRIVA RESPIMAT) 1.25 MCG/ACT AERS Inhale 2 puffs into the lungs daily. 4 each 0   Current Facility-Administered Medications  Medication Dose Route Frequency Provider Last Rate Last Admin   polymixin-bacitracin (POLYSPORIN) ointment   Topical BID Brimage, Vondra, DO        OBJECTIVE: middle-aged [place] woman who appears older than stated age 50:   07/18/21 1009  BP: 124/74  Pulse: (!) 101  Resp: 20  Temp: 97.9 F (36.6  C)  SpO2: 99%     Body mass index is 41.64 kg/m.    ECOG FS:2 - Symptomatic, <50% confined to bed  Sclerae unicteric, EOMs intact Wearing a mask No cervical or supraclavicular adenopathy Lungs no rales or rhonchi Heart regular rate and rhythm Abd soft, nontender, positive bowel sounds MSK no focal spinal tenderness, no upper extremity lymphedema Neuro: nonfocal, well oriented, appropriate affect Breasts: The right breast is unremarkable.  There is a palpable mass in the right axilla which is scheduled for  resection.  In the left breast there are subcutaneous nodules over the breast in addition to skin thickening of concern for local spread.  I do not see evidence of these lesions outside the left breast.   LAB RESULTS:  CMP     Component Value Date/Time   NA 138 07/05/2021 0939   NA 138 03/10/2020 1534   NA 139 01/19/2015 1422   K 3.7 07/05/2021 0939   K 3.8 01/19/2015 1422   CL 105 07/05/2021 0939   CO2 24 07/05/2021 0939   CO2 23 01/19/2015 1422   GLUCOSE 92 07/05/2021 0939   GLUCOSE 93 01/19/2015 1422   BUN 7 07/05/2021 0939   BUN 8 03/10/2020 1534   BUN 8.6 01/19/2015 1422   CREATININE 1.02 (H) 07/05/2021 0939   CREATININE 1.1 01/19/2015 1422   CALCIUM 9.2 07/05/2021 0939   CALCIUM 9.3 01/19/2015 1422   PROT 7.7 07/05/2021 0939   PROT 7.2 02/11/2020 1626   PROT 7.3 01/19/2015 1422   ALBUMIN 4.0 07/05/2021 0939   ALBUMIN 4.4 02/11/2020 1626   ALBUMIN 3.9 01/19/2015 1422   AST 10 (L) 07/05/2021 0939   AST 14 01/19/2015 1422   ALT <6 07/05/2021 0939   ALT 13 01/19/2015 1422   ALKPHOS 68 07/05/2021 0939   ALKPHOS 82 01/19/2015 1422   BILITOT 0.3 07/05/2021 0939   BILITOT 0.35 01/19/2015 1422   GFRNONAA >60 07/05/2021 0939   GFRAA 76 03/10/2020 1534    I No results found for: SPEP  Lab Results  Component Value Date   WBC 5.4 07/05/2021   NEUTROABS 2.9 07/05/2021   HGB 9.1 (L) 07/05/2021   HCT 30.0 (L) 07/05/2021   MCV 71.3 (L) 07/05/2021   PLT 292  07/05/2021      Chemistry      Component Value Date/Time   NA 138 07/05/2021 0939   NA 138 03/10/2020 1534   NA 139 01/19/2015 1422   K 3.7 07/05/2021 0939   K 3.8 01/19/2015 1422   CL 105 07/05/2021 0939   CO2 24 07/05/2021 0939   CO2 23 01/19/2015 1422   BUN 7 07/05/2021 0939   BUN 8 03/10/2020 1534   BUN 8.6 01/19/2015 1422   CREATININE 1.02 (H) 07/05/2021 0939   CREATININE 1.1 01/19/2015 1422      Component Value Date/Time   CALCIUM 9.2 07/05/2021 0939   CALCIUM 9.3 01/19/2015 1422   ALKPHOS 68 07/05/2021 0939   ALKPHOS 82 01/19/2015 1422   AST 10 (L) 07/05/2021 0939   AST 14 01/19/2015 1422   ALT <6 07/05/2021 0939   ALT 13 01/19/2015 1422   BILITOT 0.3 07/05/2021 0939   BILITOT 0.35 01/19/2015 1422       Lab Results  Component Value Date   LABCA2 28 04/30/2014    No components found for: [id number]  No results for input(s): INR in the last 168 hours.  Urinalysis    Component Value Date/Time   COLORURINE YELLOW 03/07/2017 2111   APPEARANCEUR CLEAR 03/07/2017 2111   LABSPEC 1.016 03/07/2017 2111   PHURINE 7.0 03/07/2017 2111   GLUCOSEU NEGATIVE 03/07/2017 2111   HGBUR NEGATIVE 03/07/2017 2111   BILIRUBINUR negative 03/10/2020 1515   KETONESUR negative 03/10/2020 1515   [place] 03/07/2017 2111   PROTEINUR =30 (A) 03/10/2020 1515   PROTEINUR NEGATIVE 03/07/2017 2111   UROBILINOGEN 0.2 03/10/2020 1515   UROBILINOGEN 0.2 07/10/2014 1951   NITRITE Negative 03/10/2020 1515   NITRITE NEGATIVE 03/07/2017 2111   LEUKOCYTESUR Negative  03/10/2020 1515    STUDIES: CT HEAD W & WO CONTRAST (5MM)  Result Date: 07/15/2021 CLINICAL DATA:  Breast cancer staging EXAM: CT HEAD WITHOUT AND WITH CONTRAST TECHNIQUE: Contiguous axial images were obtained from the base of the skull through the vertex without and with intravenous contrast CONTRAST:  80m OMNIPAQUE IOHEXOL 350 MG/ML SOLN COMPARISON:  None. FINDINGS: Brain: No evidence of acute infarction,  hemorrhage, hydrocephalus, extra-axial collection or mass lesion/mass effect. Normal enhancement. Negative for metastatic disease. No edema in the brain. Vascular: Negative for hyperdense vessel. Normal vascular enhancement Skull: No suspicious skeletal lesion. Sinuses/Orbits: Small osteoma right ethmoid sinus, benign-appearing. No significant mucosal edema. Negative orbit Other: None IMPRESSION: Negative CT head with contrast.  Negative for metastatic disease. Electronically Signed   By: CFranchot GalloM.D.   On: 07/15/2021 10:34   CT Chest W Contrast  Result Date: 07/17/2021 CLINICAL DATA:  50year old female with history of breast cancer diagnosed in 2016 status post lumpectomy, chemotherapy and radiation therapy which is now complete. Evaluate for metastatic disease. EXAM: CT CHEST WITH CONTRAST TECHNIQUE: Multidetector CT imaging of the chest was performed during intravenous contrast administration. CONTRAST:  616mOMNIPAQUE IOHEXOL 350 MG/ML SOLN COMPARISON:  Chest CT 10/25/2019. FINDINGS: Cardiovascular: Heart size is normal. There is no significant pericardial fluid, thickening or pericardial calcification. Aortic atherosclerosis. No definite coronary artery calcifications. Mediastinum/Nodes: No pathologically enlarged mediastinal, hilar or internal mammary lymph nodes. Esophagus is unremarkable in appearance. No axillary lymphadenopathy. Surgical clips in the left axilla from prior lymph node dissection. Lungs/Pleura: No suspicious appearing pulmonary nodules or masses are noted. No acute consolidative airspace disease. No pleural effusions. Upper Abdomen: Status post cholecystectomy. Low-attenuation lesions in the liver, incompletely characterized on today's examination, but statistically likely to represent small cysts. Musculoskeletal: There are no aggressive appearing lytic or blastic lesions noted in the visualized portions of the skeleton. IMPRESSION: 1. No findings to suggest metastatic disease  in the thorax. 2. Aortic atherosclerosis. Aortic Atherosclerosis (ICD10-I70.0). Electronically Signed   By: DaVinnie Langton.D.   On: 07/17/2021 09:32     ASSESSMENT: [age].o. BRCA negative  woman status post left lumpectomy and sentinel lymph node sampling 05/03/2014 for a pT1c pN0, stage IA invasive ductal carcinoma, grade 2, estrogen receptor 83% positive, progesterone receptor 66% positive, with an MIB-1 of 14% and no HER-2 amplification.  (1) Oncotype score of 22 predicts a risk of outside the breast recurrence within 10 years of 13% if the patient's only systemic therapy is tamoxifen for 5 years.  (2) adjuvant chemotherapy with cyclophosphamide and docetaxel started 07/06/2014, patient tolerated chemotherapy very poorly and it was discontinued after one cycle.    (3) adjuvant radiation completed 10/21/2014 1) Left Breast / 50.4 Gy in 28 fractions 2) Left supraclavicular / 50.4 Gy in 28 fractions 3) Left Posterior axillary boost  / 9.52 Gy in 28 fractions 4) Left Breast Boost / 10 Gy in 5 fractions  (4) anastrozole started 01/19/2015-[name] informed me she never started this.   METASTATIC DISEASE?: September 2022 (5) mammography 05/24/2021 shows fluid collection in the right axilla, no obvious malignancy within each breast but superficial skin lesions throughout the upper outer left breast.  (A) CT scan of the abdomen and pelvis with contrast 06/10/2021 shows small low-attenuation liver lesions which could be cysts but no definite evidence of metastatic disease.  (B) chest CT scan 07/14/2021 shows no evidence of metastatic disease  (C) CT head with and without contrast on 07/15/2021 shows no evidence of metastatic  disease (D) biopsy skin lesions and right axilla (E) CA 27-29 on 07/05/2021 is normal at 21.1     (6) iron deficiency anemia: On 07/05/2021 the ferritin was less than 4 and the iron saturation 3%; hemoglobin was 9.1 with an MCV of 71.3  (A) Venofer started on  07/18/2021, to be repeated x5   (B) GI evaluation 08/07/2021  PLAN: Meara generally feels well and looks at her baseline.  We are starting Venofer today and she has a good understanding of the possible toxicities side effects and complications of this agent which is generally however well-tolerated.  As she clearly will feel much better once her hemoglobin rises after a few iron doses.  She is already scheduled with GI for further evaluation mid-October to see if we can uncover the cause of the anemia  She is also scheduled for a right axillary mass excision and left breast skin nodule sampling.  She will return to see me on 08/08/2021 by which time we should have a definitive diagnosis available  Total encounter time 35 minutes.Sarajane Jews C. Mariapaula Krist, MD 07/18/21 6:18 PM Medical Oncology and Hematology [facility] [place], [place] [address] Tel. [phone]    Fax. [phone]   I, Wilburn Mylar, am acting as scribe for Dr. Virgie Dad. Shaka Cardin.  I, Lurline Del MD, have reviewed the above documentation for accuracy and completeness, and I agree with the above.    *Total Encounter Time as defined by the Centers for Medicare and Medicaid Services includes, in addition to the face-to-face time of a patient visit (documented in the note above) non-face-to-face time: obtaining and reviewing outside history, ordering and reviewing medications, tests or procedures, care coordination (communications with other health care professionals or caregivers) and documentation in the medical record.

## 2021-07-18 ENCOUNTER — Inpatient Hospital Stay: Payer: Medicare Other

## 2021-07-18 ENCOUNTER — Inpatient Hospital Stay (HOSPITAL_BASED_OUTPATIENT_CLINIC_OR_DEPARTMENT_OTHER): Payer: Medicare Other | Admitting: Oncology

## 2021-07-18 ENCOUNTER — Other Ambulatory Visit: Payer: Self-pay

## 2021-07-18 ENCOUNTER — Other Ambulatory Visit: Payer: Self-pay | Admitting: General Surgery

## 2021-07-18 ENCOUNTER — Other Ambulatory Visit: Payer: Self-pay | Admitting: *Deleted

## 2021-07-18 VITALS — BP 130/92 | HR 84 | Temp 97.7°F | Resp 18

## 2021-07-18 VITALS — BP 124/74 | HR 101 | Temp 97.9°F | Resp 20 | Wt 290.2 lb

## 2021-07-18 DIAGNOSIS — R590 Localized enlarged lymph nodes: Secondary | ICD-10-CM | POA: Diagnosis not present

## 2021-07-18 DIAGNOSIS — C50412 Malignant neoplasm of upper-outer quadrant of left female breast: Secondary | ICD-10-CM

## 2021-07-18 DIAGNOSIS — F17210 Nicotine dependence, cigarettes, uncomplicated: Secondary | ICD-10-CM | POA: Diagnosis not present

## 2021-07-18 DIAGNOSIS — Z923 Personal history of irradiation: Secondary | ICD-10-CM | POA: Diagnosis not present

## 2021-07-18 DIAGNOSIS — Z800 Family history of malignant neoplasm of digestive organs: Secondary | ICD-10-CM | POA: Diagnosis not present

## 2021-07-18 DIAGNOSIS — Z7189 Other specified counseling: Secondary | ICD-10-CM | POA: Insufficient documentation

## 2021-07-18 DIAGNOSIS — Z8616 Personal history of COVID-19: Secondary | ICD-10-CM | POA: Diagnosis not present

## 2021-07-18 DIAGNOSIS — Z170 Estrogen receptor positive status [ER+]: Secondary | ICD-10-CM

## 2021-07-18 DIAGNOSIS — Z8249 Family history of ischemic heart disease and other diseases of the circulatory system: Secondary | ICD-10-CM | POA: Diagnosis not present

## 2021-07-18 DIAGNOSIS — Z23 Encounter for immunization: Secondary | ICD-10-CM | POA: Diagnosis not present

## 2021-07-18 DIAGNOSIS — D509 Iron deficiency anemia, unspecified: Secondary | ICD-10-CM | POA: Diagnosis not present

## 2021-07-18 DIAGNOSIS — Z79899 Other long term (current) drug therapy: Secondary | ICD-10-CM | POA: Diagnosis not present

## 2021-07-18 DIAGNOSIS — J449 Chronic obstructive pulmonary disease, unspecified: Secondary | ICD-10-CM | POA: Diagnosis not present

## 2021-07-18 DIAGNOSIS — Z9221 Personal history of antineoplastic chemotherapy: Secondary | ICD-10-CM | POA: Diagnosis not present

## 2021-07-18 MED ORDER — SODIUM CHLORIDE 0.9 % IV SOLN
200.0000 mg | Freq: Once | INTRAVENOUS | Status: AC
  Administered 2021-07-18: 200 mg via INTRAVENOUS
  Filled 2021-07-18: qty 200

## 2021-07-18 MED ORDER — SODIUM CHLORIDE 0.9% FLUSH
10.0000 mL | Freq: Once | INTRAVENOUS | Status: AC | PRN
  Administered 2021-07-18: 10 mL

## 2021-07-18 MED ORDER — HEPARIN SOD (PORK) LOCK FLUSH 100 UNIT/ML IV SOLN
500.0000 [IU] | Freq: Once | INTRAVENOUS | Status: AC | PRN
  Administered 2021-07-18: 500 [IU]

## 2021-07-18 MED ORDER — SODIUM CHLORIDE 0.9 % IV SOLN: Freq: Once | INTRAVENOUS | Status: AC

## 2021-07-18 NOTE — Patient Instructions (Signed)

## 2021-07-18 NOTE — Pre-Procedure Instructions (Signed)
Surgical Instructions    Your procedure is scheduled on Tuesday 07/25/21. .  Report to [facility] Main Entrance "A" at 05:30 A.M., then check in with the Admitting office.  Call this number if you have problems the morning of surgery:  [phone]   If you have any questions prior to your surgery date call [phone]: Open Monday-Friday 8am-4pm    Remember:  Do not eat after midnight the night before your surgery  You may drink clear liquids until 04:30 A.M. the morning of your surgery.   Clear liquids allowed are: Water, Non-Citrus Juices (without pulp), Carbonated Beverages, Clear Tea, Black Coffee ONLY (NO MILK, CREAM OR POWDERED CREAMER of any kind), and Gatorade    Take these medicines the morning of surgery with A SIP OF WATER   busPIRone (BUSPAR)  fluticasone (FLONASE)  pantoprazole (PROTONIX)   Tiotropium Bromide Monohydrate (SPIRIVA RESPIMAT)     Take these medicines if needed:   albuterol (VENTOLIN HFA)     As of today, STOP taking any Aspirin (unless otherwise instructed by your surgeon) Aleve, Naproxen, Ibuprofen, Motrin, Advil, Goody's, BC's, all herbal medications, fish oil, and all vitamins.          Do not wear jewelry or makeup Do not wear lotions, powders, perfumes/colognes, or deodorant. Do not shave 48 hours prior to surgery.  Men may shave face and neck. Do not bring valuables to the hospital. DO Not wear nail polish, gel polish, artificial nails, or any other type of covering on natural nails including finger and toenails. If patients have artificial nails, gel coating, etc. that need to be removed by a nail salon please have this removed prior to surgery or surgery may need to be canceled/delayed if the surgeon/ anesthesia feels like the patient is unable to be adequately monitored.             [facility] is not responsible for any belongings or valuables.  Do NOT Smoke (Tobacco/Vaping)  24 hours prior to your procedure If you use a CPAP at night,  you may bring your mask for your overnight stay.   Contacts, glasses, dentures or bridgework may not be worn into surgery, please bring cases for these belongings   For patients admitted to the hospital, discharge time will be determined by your treatment team.   Patients discharged the day of surgery will not be allowed to drive home, and someone needs to stay with them for 24 hours.  NO VISITORS WILL BE ALLOWED IN PRE-OP WHERE PATIENTS GET READY FOR SURGERY.  ONLY 1 SUPPORT PERSON MAY BE PRESENT WHILE YOU ARE IN SURGERY.  IF YOU ARE TO BE ADMITTED, ONCE YOU ARE IN YOUR ROOM YOU WILL BE ALLOWED TWO (2) VISITORS.  Minor children may have two parents present. Special consideration for safety and communication needs will be reviewed on a case by case basis.  Special instructions:    Oral Hygiene is also important to reduce your risk of infection.  Remember - BRUSH YOUR TEETH THE MORNING OF SURGERY WITH YOUR REGULAR TOOTHPASTE   [facility]- Preparing For Surgery  Before surgery, you can play an important role. Because skin is not sterile, your skin needs to be as free of germs as possible. You can reduce the number of germs on your skin by washing with CHG (chlorahexidine gluconate) Soap before surgery.  CHG is an antiseptic cleaner which kills germs and bonds with the skin to continue killing germs even after washing.  Please do not use if you have an allergy to CHG or antibacterial soaps. If your skin becomes reddened/irritated stop using the CHG.  Do not shave (including legs and underarms) for at least 48 hours prior to first CHG shower. It is OK to shave your face.  Please follow these instructions carefully.     Shower the NIGHT BEFORE SURGERY and the MORNING OF SURGERY with CHG Soap.   If you chose to wash your hair, wash your hair first as usual with your normal shampoo. After you shampoo, rinse your hair and body thoroughly to remove the shampoo.  Then [employer] and genitals  (private parts) with your normal soap and rinse thoroughly to remove soap.  After that Use CHG Soap as you would any other liquid soap. You can apply CHG directly to the skin and wash gently with a scrungie or a clean washcloth.   Apply the CHG Soap to your body ONLY FROM THE NECK DOWN.  Do not use on open wounds or open sores. Avoid contact with your eyes, ears, mouth and genitals (private parts). Wash Face and genitals (private parts)  with your normal soap.   Wash thoroughly, paying special attention to the area where your surgery will be performed.  Thoroughly rinse your body with warm water from the neck down.  DO NOT shower/wash with your normal soap after using and rinsing off the CHG Soap.  Pat yourself dry with a CLEAN TOWEL.  Wear CLEAN PAJAMAS to bed the night before surgery  Place CLEAN SHEETS on your bed the night before your surgery  DO NOT SLEEP WITH PETS.   Day of Surgery:  Take a shower with CHG soap. Wear Clean/Comfortable clothing the morning of surgery Do not apply any deodorants/lotions.   Remember to brush your teeth WITH YOUR REGULAR TOOTHPASTE.   Please read over the following fact sheets that you were given.

## 2021-07-19 ENCOUNTER — Encounter (HOSPITAL_COMMUNITY): Payer: Self-pay

## 2021-07-19 ENCOUNTER — Encounter (HOSPITAL_COMMUNITY)
Admission: RE | Admit: 2021-07-19 | Discharge: 2021-07-19 | Disposition: A | Discharge status: Home / Self Care | Payer: Medicare Other | Source: Ambulatory Visit | Attending: General Surgery | Admitting: General Surgery

## 2021-07-19 ENCOUNTER — Other Ambulatory Visit: Payer: Self-pay

## 2021-07-19 ENCOUNTER — Other Ambulatory Visit: Payer: Self-pay | Admitting: *Deleted

## 2021-07-19 DIAGNOSIS — R229 Localized swelling, mass and lump, unspecified: Secondary | ICD-10-CM | POA: Insufficient documentation

## 2021-07-19 DIAGNOSIS — Z01818 Encounter for other preprocedural examination: Secondary | ICD-10-CM | POA: Diagnosis not present

## 2021-07-19 DIAGNOSIS — Z853 Personal history of malignant neoplasm of breast: Secondary | ICD-10-CM | POA: Diagnosis not present

## 2021-07-19 NOTE — Progress Notes (Signed)
PCP - Fredirick Lathe Cardiologist -   PPM/ICD - denies Device Orders -  Rep Notified -   Chest x-ray -01/26/21  EKG - 01/05/21 Stress Test - none ECHO - 03/08/17 Cardiac Cath - none  Sleep Study -  CPAP - no  Fasting Blood Sugar - n/a Checks Blood Sugar _____ times a day  Blood Thinner Instructions:n/a Aspirin Instructions:n/a  ERAS Protcol -yes-clears until 0430 PRE-SURGERY Ensure or G2- Ensure  COVID TEST- n/a-ambulatory surgery   Anesthesia review: no  Patient denies shortness of breath, fever, cough and chest pain at PAT appointment   All instructions explained to the patient, with a verbal understanding of the material. Patient agrees to go over the instructions while at home for a better understanding. Patient also instructed to self quarantine after being tested for COVID-19. The opportunity to ask questions was provided.

## 2021-07-19 NOTE — Pre-Procedure Instructions (Signed)
Surgical Instructions    Your procedure is scheduled on Tuesday 07/25/21. .  Report to [facility] Main Entrance "A" at 05:30 A.M., then check in with the Admitting office.  Call this number if you have problems the morning of surgery:  [phone]   If you have any questions prior to your surgery date call [phone]: Open Monday-Friday 8am-4pm    Remember:  Do not eat after midnight the night before your surgery  You may drink clear liquids until 04:30 A.M. the morning of your surgery.   Clear liquids allowed are: Water, Non-Citrus Juices (without pulp), Carbonated Beverages, Clear Tea, Black Coffee ONLY (NO MILK, CREAM OR POWDERED CREAMER of any kind), and Gatorade.  The night before surgery:  No food after midnight. ONLY clear liquids after midnight  The day of surgery (if you do NOT have diabetes):  Drink ONE (1) Pre-Surgery Clear Ensure by 4:30am the morning of surgery. Drink in one sitting. Do not sip.  This drink was given to you during your hospital  pre-op appointment visit.  Nothing else to drink after completing the  Pre-Surgery Clear Ensure.        If you have questions, please contact your surgeon's office.     Take these medicines the morning of surgery with A SIP OF WATER   busPIRone (BUSPAR)  fluticasone (FLONASE)  pantoprazole (PROTONIX)   Tiotropium Bromide Monohydrate (SPIRIVA RESPIMAT)     Take these medicines if needed:   albuterol (VENTOLIN HFA)     As of today, STOP taking any Aspirin (unless otherwise instructed by your surgeon) Aleve, Naproxen, Ibuprofen, Motrin, Advil, Goody's, BC's, all herbal medications, fish oil, and all vitamins.          Do not wear jewelry or makeup Do not wear lotions, powders, perfumes/colognes, or deodorant. Do not shave 48 hours prior to surgery.  Men may shave face and neck. Do not bring valuables to the hospital. DO Not wear nail polish, gel polish, artificial nails, or any other type of covering on natural  nails including finger and toenails. If patients have artificial nails, gel coating, etc. that need to be removed by a nail salon please have this removed prior to surgery or surgery may need to be canceled/delayed if the surgeon/ anesthesia feels like the patient is unable to be adequately monitored.             [facility] is not responsible for any belongings or valuables.  Do NOT Smoke (Tobacco/Vaping)  24 hours prior to your procedure If you use a CPAP at night, you may bring your mask for your overnight stay.   Contacts, glasses, dentures or bridgework may not be worn into surgery, please bring cases for these belongings   For patients admitted to the hospital, discharge time will be determined by your treatment team.   Patients discharged the day of surgery will not be allowed to drive home, and someone needs to stay with them for 24 hours.  NO VISITORS WILL BE ALLOWED IN PRE-OP WHERE PATIENTS GET READY FOR SURGERY.  ONLY 1 SUPPORT PERSON MAY BE PRESENT WHILE YOU ARE IN SURGERY.  IF YOU ARE TO BE ADMITTED, ONCE YOU ARE IN YOUR ROOM YOU WILL BE ALLOWED TWO (2) VISITORS.  Minor children may have two parents present. Special consideration for safety and communication needs will be reviewed on a case by case basis.  Special instructions:    Oral Hygiene is also important to reduce your risk of infection.  Remember - BRUSH YOUR TEETH THE MORNING OF SURGERY WITH YOUR REGULAR TOOTHPASTE   [facility]- Preparing For Surgery  Before surgery, you can play an important role. Because skin is not sterile, your skin needs to be as free of germs as possible. You can reduce the number of germs on your skin by washing with CHG (chlorahexidine gluconate) Soap before surgery.  CHG is an antiseptic cleaner which kills germs and bonds with the skin to continue killing germs even after washing.     Please do not use if you have an allergy to CHG or antibacterial soaps. If your skin becomes  reddened/irritated stop using the CHG.  Do not shave (including legs and underarms) for at least 48 hours prior to first CHG shower. It is OK to shave your face.  Please follow these instructions carefully.     Shower the NIGHT BEFORE SURGERY and the MORNING OF SURGERY with CHG Soap.   If you chose to wash your hair, wash your hair first as usual with your normal shampoo. After you shampoo, rinse your hair and body thoroughly to remove the shampoo.  Then [employer] and genitals (private parts) with your normal soap and rinse thoroughly to remove soap.  After that Use CHG Soap as you would any other liquid soap. You can apply CHG directly to the skin and wash gently with a scrungie or a clean washcloth.   Apply the CHG Soap to your body ONLY FROM THE NECK DOWN.  Do not use on open wounds or open sores. Avoid contact with your eyes, ears, mouth and genitals (private parts). Wash Face and genitals (private parts)  with your normal soap.   Wash thoroughly, paying special attention to the area where your surgery will be performed.  Thoroughly rinse your body with warm water from the neck down.  DO NOT shower/wash with your normal soap after using and rinsing off the CHG Soap.  Pat yourself dry with a CLEAN TOWEL.  Wear CLEAN PAJAMAS to bed the night before surgery  Place CLEAN SHEETS on your bed the night before your surgery  DO NOT SLEEP WITH PETS.   Day of Surgery:  Take a shower with CHG soap. Wear Clean/Comfortable clothing the morning of surgery Do not apply any deodorants/lotions.   Remember to brush your teeth WITH YOUR REGULAR TOOTHPASTE.   Please read over the following fact sheets that you were given.

## 2021-07-20 ENCOUNTER — Telehealth: Payer: Self-pay | Admitting: Oncology

## 2021-07-20 NOTE — Telephone Encounter (Signed)
Rescheduled 10/01 appointment to 09/30 per Melanie's request, patient is notified of upcoming appointment.

## 2021-07-21 ENCOUNTER — Other Ambulatory Visit: Payer: Self-pay | Admitting: *Deleted

## 2021-07-21 ENCOUNTER — Other Ambulatory Visit: Payer: Self-pay

## 2021-07-21 ENCOUNTER — Inpatient Hospital Stay: Payer: Medicare Other

## 2021-07-21 VITALS — BP 122/69 | HR 96 | Temp 97.4°F | Resp 20

## 2021-07-21 DIAGNOSIS — R590 Localized enlarged lymph nodes: Secondary | ICD-10-CM | POA: Diagnosis not present

## 2021-07-21 DIAGNOSIS — F17210 Nicotine dependence, cigarettes, uncomplicated: Secondary | ICD-10-CM | POA: Diagnosis not present

## 2021-07-21 DIAGNOSIS — Z800 Family history of malignant neoplasm of digestive organs: Secondary | ICD-10-CM | POA: Diagnosis not present

## 2021-07-21 DIAGNOSIS — Z923 Personal history of irradiation: Secondary | ICD-10-CM | POA: Diagnosis not present

## 2021-07-21 DIAGNOSIS — J449 Chronic obstructive pulmonary disease, unspecified: Secondary | ICD-10-CM | POA: Diagnosis not present

## 2021-07-21 DIAGNOSIS — D509 Iron deficiency anemia, unspecified: Secondary | ICD-10-CM | POA: Diagnosis not present

## 2021-07-21 DIAGNOSIS — Z23 Encounter for immunization: Secondary | ICD-10-CM | POA: Diagnosis not present

## 2021-07-21 DIAGNOSIS — Z170 Estrogen receptor positive status [ER+]: Secondary | ICD-10-CM | POA: Diagnosis not present

## 2021-07-21 DIAGNOSIS — Z9221 Personal history of antineoplastic chemotherapy: Secondary | ICD-10-CM | POA: Diagnosis not present

## 2021-07-21 DIAGNOSIS — Z8249 Family history of ischemic heart disease and other diseases of the circulatory system: Secondary | ICD-10-CM | POA: Diagnosis not present

## 2021-07-21 DIAGNOSIS — C50412 Malignant neoplasm of upper-outer quadrant of left female breast: Secondary | ICD-10-CM | POA: Diagnosis not present

## 2021-07-21 DIAGNOSIS — Z79899 Other long term (current) drug therapy: Secondary | ICD-10-CM | POA: Diagnosis not present

## 2021-07-21 DIAGNOSIS — Z8616 Personal history of COVID-19: Secondary | ICD-10-CM | POA: Diagnosis not present

## 2021-07-21 MED ORDER — SODIUM CHLORIDE 0.9% FLUSH
10.0000 mL | Freq: Once | INTRAVENOUS | Status: AC | PRN
  Administered 2021-07-21: 10 mL

## 2021-07-21 MED ORDER — LIDOCAINE-PRILOCAINE 2.5-2.5 % EX CREA: 1.0000 "application " | TOPICAL_CREAM | CUTANEOUS | 0 refills | Status: AC | PRN

## 2021-07-21 MED ORDER — SODIUM CHLORIDE 0.9 % IV SOLN: Freq: Once | INTRAVENOUS | Status: AC

## 2021-07-21 MED ORDER — HEPARIN SOD (PORK) LOCK FLUSH 100 UNIT/ML IV SOLN
500.0000 [IU] | Freq: Once | INTRAVENOUS | Status: AC | PRN
  Administered 2021-07-21: 500 [IU]

## 2021-07-21 MED ORDER — SODIUM CHLORIDE 0.9 % IV SOLN
200.0000 mg | Freq: Once | INTRAVENOUS | Status: AC
  Administered 2021-07-21: 200 mg via INTRAVENOUS
  Filled 2021-07-21: qty 200

## 2021-07-21 NOTE — Progress Notes (Signed)
Patient was observed for 30 minutes post infusion with no adverse reaction. Vitals stable and patient in no distress upon leaving infusion room.  

## 2021-07-21 NOTE — Patient Instructions (Signed)

## 2021-07-22 ENCOUNTER — Ambulatory Visit: Payer: Medicare Other

## 2021-07-24 NOTE — Anesthesia Preprocedure Evaluation (Addendum)
Anesthesia Evaluation  Patient identified by MRN, date of birth, ID band Patient awake    Reviewed: Allergy & Precautions, NPO status , Patient's Chart, lab work & pertinent test results  Airway Mallampati: II  TM Distance: >3 FB Neck ROM: Full    Dental  (+) Edentulous Upper, Edentulous Lower   Pulmonary COPD, Current Smoker and Patient abstained from smoking.,    Pulmonary exam normal breath sounds clear to auscultation       Cardiovascular Exercise Tolerance: Good Normal cardiovascular exam(-) Valvular Problems/Murmurs Rhythm:Regular Rate:Normal  Most recent EKG reviewed   Neuro/Psych PSYCHIATRIC DISORDERS Bipolar Disorder    GI/Hepatic negative GI ROS, Neg liver ROS,   Endo/Other  Morbid obesity  Renal/GU negative Renal ROS     Musculoskeletal  (+) Arthritis , Osteoarthritis,    Abdominal   Peds  Hematology  (+) anemia ,   Anesthesia Other Findings H/o breast cancer  Reproductive/Obstetrics                           Anesthesia Physical Anesthesia Plan  ASA: 3  Anesthesia Plan: General   Post-op Pain Management:    Induction: Intravenous  PONV Risk Score and Plan: Ondansetron, Dexamethasone, Midazolam, Scopolamine patch - Pre-op and Treatment may vary due to age or medical condition  Airway Management Planned: LMA  Additional Equipment:   Intra-op Plan:   Post-operative Plan: Extubation in OR  Informed Consent: I have reviewed the patients History and Physical, chart, labs and discussed the procedure including the risks, benefits and alternatives for the proposed anesthesia with the patient or authorized representative who has indicated his/her understanding and acceptance.     Dental advisory given  Plan Discussed with: Anesthesiologist and CRNA  Anesthesia Plan Comments:        Anesthesia Quick Evaluation

## 2021-07-25 ENCOUNTER — Ambulatory Visit: Payer: Medicare Other

## 2021-07-25 ENCOUNTER — Ambulatory Visit (HOSPITAL_COMMUNITY)
Admission: RE | Admit: 2021-07-25 | Discharge: 2021-07-25 | Disposition: A | Discharge status: Home / Self Care | Payer: Medicare Other | Attending: General Surgery | Admitting: General Surgery

## 2021-07-25 ENCOUNTER — Ambulatory Visit (HOSPITAL_COMMUNITY): Payer: Medicare Other | Admitting: Certified Registered Nurse Anesthetist

## 2021-07-25 ENCOUNTER — Other Ambulatory Visit: Payer: Self-pay

## 2021-07-25 ENCOUNTER — Encounter (HOSPITAL_COMMUNITY)
Admission: RE | Disposition: A | Discharge status: Home / Self Care | Payer: Self-pay | Source: Home / Self Care | Attending: General Surgery

## 2021-07-25 ENCOUNTER — Encounter (HOSPITAL_COMMUNITY): Payer: Self-pay | Admitting: General Surgery

## 2021-07-25 DIAGNOSIS — C792 Secondary malignant neoplasm of skin: Secondary | ICD-10-CM | POA: Insufficient documentation

## 2021-07-25 DIAGNOSIS — C50919 Malignant neoplasm of unspecified site of unspecified female breast: Secondary | ICD-10-CM | POA: Diagnosis not present

## 2021-07-25 DIAGNOSIS — Z853 Personal history of malignant neoplasm of breast: Secondary | ICD-10-CM | POA: Insufficient documentation

## 2021-07-25 DIAGNOSIS — R222 Localized swelling, mass and lump, trunk: Secondary | ICD-10-CM | POA: Diagnosis not present

## 2021-07-25 DIAGNOSIS — Z923 Personal history of irradiation: Secondary | ICD-10-CM | POA: Insufficient documentation

## 2021-07-25 DIAGNOSIS — Z6841 Body Mass Index (BMI) 40.0 and over, adult: Secondary | ICD-10-CM | POA: Insufficient documentation

## 2021-07-25 DIAGNOSIS — N6489 Other specified disorders of breast: Secondary | ICD-10-CM | POA: Diagnosis not present

## 2021-07-25 DIAGNOSIS — Z9221 Personal history of antineoplastic chemotherapy: Secondary | ICD-10-CM | POA: Diagnosis not present

## 2021-07-25 DIAGNOSIS — E119 Type 2 diabetes mellitus without complications: Secondary | ICD-10-CM | POA: Diagnosis not present

## 2021-07-25 DIAGNOSIS — Z8616 Personal history of COVID-19: Secondary | ICD-10-CM | POA: Insufficient documentation

## 2021-07-25 DIAGNOSIS — J449 Chronic obstructive pulmonary disease, unspecified: Secondary | ICD-10-CM | POA: Diagnosis not present

## 2021-07-25 DIAGNOSIS — F17210 Nicotine dependence, cigarettes, uncomplicated: Secondary | ICD-10-CM | POA: Insufficient documentation

## 2021-07-25 DIAGNOSIS — L728 Other follicular cysts of the skin and subcutaneous tissue: Secondary | ICD-10-CM | POA: Insufficient documentation

## 2021-07-25 DIAGNOSIS — M1990 Unspecified osteoarthritis, unspecified site: Secondary | ICD-10-CM | POA: Diagnosis not present

## 2021-07-25 DIAGNOSIS — Z79899 Other long term (current) drug therapy: Secondary | ICD-10-CM | POA: Insufficient documentation

## 2021-07-25 DIAGNOSIS — L723 Sebaceous cyst: Secondary | ICD-10-CM | POA: Diagnosis not present

## 2021-07-25 DIAGNOSIS — C7981 Secondary malignant neoplasm of breast: Secondary | ICD-10-CM | POA: Diagnosis not present

## 2021-07-25 DIAGNOSIS — L720 Epidermal cyst: Secondary | ICD-10-CM | POA: Diagnosis not present

## 2021-07-25 DIAGNOSIS — L989 Disorder of the skin and subcutaneous tissue, unspecified: Secondary | ICD-10-CM | POA: Diagnosis present

## 2021-07-25 HISTORY — PX: MINOR BREAST BIOPSY: SHX5977

## 2021-07-25 HISTORY — PX: MASS EXCISION: SHX2000

## 2021-07-25 LAB — GLUCOSE, CAPILLARY
Glucose-Capillary: 128 mg/dL — ABNORMAL HIGH (ref 70–99)
Glucose-Capillary: 106 mg/dL — ABNORMAL HIGH (ref 70–99)

## 2021-07-25 LAB — POCT PREGNANCY, URINE: Preg Test, Ur: NEGATIVE

## 2021-07-25 SURGERY — EXCISION MASS
Anesthesia: General | Site: Breast | Laterality: Right

## 2021-07-25 MED ORDER — MIDAZOLAM HCL 2 MG/2ML IJ SOLN
INTRAMUSCULAR | Status: AC
  Filled 2021-07-25: qty 2

## 2021-07-25 MED ORDER — CHLORHEXIDINE GLUCONATE 0.12 % MT SOLN
15.0000 mL | Freq: Once | OROMUCOSAL | Status: AC
  Administered 2021-07-25: 15 mL via OROMUCOSAL

## 2021-07-25 MED ORDER — OXYCODONE HCL 5 MG/5ML PO SOLN: 5.0000 mg | Freq: Once | ORAL | Status: AC | PRN

## 2021-07-25 MED ORDER — MIDAZOLAM HCL 5 MG/5ML IJ SOLN
INTRAMUSCULAR | Status: DC | PRN
  Administered 2021-07-25: 2 mg via INTRAVENOUS

## 2021-07-25 MED ORDER — FENTANYL CITRATE (PF) 250 MCG/5ML IJ SOLN
INTRAMUSCULAR | Status: AC
  Filled 2021-07-25: qty 5

## 2021-07-25 MED ORDER — CHLORHEXIDINE GLUCONATE CLOTH 2 % EX PADS: 6.0000 | MEDICATED_PAD | Freq: Once | CUTANEOUS | Status: DC

## 2021-07-25 MED ORDER — FENTANYL CITRATE (PF) 250 MCG/5ML IJ SOLN
INTRAMUSCULAR | Status: DC | PRN
  Administered 2021-07-25 (×4): 50 ug via INTRAVENOUS

## 2021-07-25 MED ORDER — OXYCODONE HCL 5 MG PO TABS
ORAL_TABLET | ORAL | Status: AC
  Filled 2021-07-25: qty 1

## 2021-07-25 MED ORDER — BACITRACIN ZINC 500 UNIT/GM EX OINT
TOPICAL_OINTMENT | CUTANEOUS | Status: AC
  Filled 2021-07-25: qty 28.35

## 2021-07-25 MED ORDER — ACETAMINOPHEN 500 MG PO TABS
1000.0000 mg | ORAL_TABLET | ORAL | Status: AC
  Administered 2021-07-25: 1000 mg via ORAL
  Filled 2021-07-25: qty 2

## 2021-07-25 MED ORDER — ONDANSETRON HCL 4 MG/2ML IJ SOLN
INTRAMUSCULAR | Status: AC
  Filled 2021-07-25: qty 2

## 2021-07-25 MED ORDER — SCOPOLAMINE 1 MG/3DAYS TD PT72
1.0000 | MEDICATED_PATCH | TRANSDERMAL | Status: DC
  Administered 2021-07-25: 1.5 mg via TRANSDERMAL
  Filled 2021-07-25: qty 1

## 2021-07-25 MED ORDER — PROPOFOL 10 MG/ML IV BOLUS
INTRAVENOUS | Status: AC
  Filled 2021-07-25: qty 20

## 2021-07-25 MED ORDER — PROMETHAZINE HCL 25 MG/ML IJ SOLN: 6.2500 mg | INTRAMUSCULAR | Status: DC | PRN

## 2021-07-25 MED ORDER — ONDANSETRON HCL 4 MG/2ML IJ SOLN
INTRAMUSCULAR | Status: DC | PRN
  Administered 2021-07-25: 4 mg via INTRAVENOUS

## 2021-07-25 MED ORDER — DEXAMETHASONE SODIUM PHOSPHATE 10 MG/ML IJ SOLN
INTRAMUSCULAR | Status: DC | PRN
  Administered 2021-07-25: 10 mg via INTRAVENOUS

## 2021-07-25 MED ORDER — LIDOCAINE 2% (20 MG/ML) 5 ML SYRINGE
INTRAMUSCULAR | Status: AC
  Filled 2021-07-25: qty 5

## 2021-07-25 MED ORDER — PROPOFOL 10 MG/ML IV BOLUS
INTRAVENOUS | Status: DC | PRN
  Administered 2021-07-25: 160 mg via INTRAVENOUS
  Administered 2021-07-25: 40 mg via INTRAVENOUS

## 2021-07-25 MED ORDER — ORAL CARE MOUTH RINSE: 15.0000 mL | Freq: Once | OROMUCOSAL | Status: AC

## 2021-07-25 MED ORDER — BUPIVACAINE-EPINEPHRINE (PF) 0.25% -1:200000 IJ SOLN
INTRAMUSCULAR | Status: AC
  Filled 2021-07-25: qty 30

## 2021-07-25 MED ORDER — CEFAZOLIN IN SODIUM CHLORIDE 3-0.9 GM/100ML-% IV SOLN
3.0000 g | INTRAVENOUS | Status: AC
  Administered 2021-07-25: 3 g via INTRAVENOUS
  Filled 2021-07-25: qty 100

## 2021-07-25 MED ORDER — 0.9 % SODIUM CHLORIDE (POUR BTL) OPTIME
TOPICAL | Status: DC | PRN
  Administered 2021-07-25: 1000 mL

## 2021-07-25 MED ORDER — DROPERIDOL 2.5 MG/ML IJ SOLN: 0.6250 mg | Freq: Once | INTRAMUSCULAR | Status: DC | PRN

## 2021-07-25 MED ORDER — FENTANYL CITRATE (PF) 100 MCG/2ML IJ SOLN
INTRAMUSCULAR | Status: AC
  Filled 2021-07-25: qty 2

## 2021-07-25 MED ORDER — DEXAMETHASONE SODIUM PHOSPHATE 10 MG/ML IJ SOLN
INTRAMUSCULAR | Status: AC
  Filled 2021-07-25: qty 1

## 2021-07-25 MED ORDER — OXYCODONE HCL 5 MG PO TABS
5.0000 mg | ORAL_TABLET | Freq: Once | ORAL | Status: AC | PRN
  Administered 2021-07-25: 5 mg via ORAL

## 2021-07-25 MED ORDER — FENTANYL CITRATE (PF) 100 MCG/2ML IJ SOLN
25.0000 ug | INTRAMUSCULAR | Status: DC | PRN
  Administered 2021-07-25 (×2): 50 ug via INTRAVENOUS

## 2021-07-25 MED ORDER — ENSURE PRE-SURGERY PO LIQD: 296.0000 mL | Freq: Once | ORAL | Status: DC

## 2021-07-25 MED ORDER — LACTATED RINGERS IV SOLN: INTRAVENOUS | Status: DC

## 2021-07-25 MED ORDER — BUPIVACAINE-EPINEPHRINE 0.25% -1:200000 IJ SOLN
INTRAMUSCULAR | Status: DC | PRN
  Administered 2021-07-25: 4 mL

## 2021-07-25 MED ORDER — LIDOCAINE 2% (20 MG/ML) 5 ML SYRINGE
INTRAMUSCULAR | Status: DC | PRN
  Administered 2021-07-25: 80 mg via INTRAVENOUS

## 2021-07-25 SURGICAL SUPPLY — 37 items
ADH SKN CLS APL DERMABOND .7 (GAUZE/BANDAGES/DRESSINGS) ×2
APL PRP STRL LF DISP 70% ISPRP (MISCELLANEOUS) ×2
BAG COUNTER SPONGE SURGICOUNT (BAG) ×3 IMPLANT
BAG SPNG CNTER NS LX DISP (BAG) ×2
BLADE CLIPPER SURG (BLADE) IMPLANT
CHLORAPREP W/TINT 26 (MISCELLANEOUS) ×3 IMPLANT
COVER SURGICAL LIGHT HANDLE (MISCELLANEOUS) ×3 IMPLANT
DECANTER SPIKE VIAL GLASS SM (MISCELLANEOUS) ×3 IMPLANT
DERMABOND ADVANCED (GAUZE/BANDAGES/DRESSINGS) ×1
DERMABOND ADVANCED .7 DNX12 (GAUZE/BANDAGES/DRESSINGS) ×2 IMPLANT
DRAPE LAPAROTOMY 100X72 PEDS (DRAPES) ×3 IMPLANT
ELECT CAUTERY BLADE 6.4 (BLADE) ×3 IMPLANT
ELECT REM PT RETURN 9FT ADLT (ELECTROSURGICAL) ×3
ELECTRODE REM PT RTRN 9FT ADLT (ELECTROSURGICAL) ×2 IMPLANT
GLOVE SURG ENC MOIS LTX SZ7 (GLOVE) ×3 IMPLANT
GLOVE SURG UNDER POLY LF SZ7.5 (GLOVE) ×3 IMPLANT
GOWN STRL REUS W/ TWL LRG LVL3 (GOWN DISPOSABLE) ×4 IMPLANT
GOWN STRL REUS W/TWL LRG LVL3 (GOWN DISPOSABLE) ×6
KIT BASIN OR (CUSTOM PROCEDURE TRAY) ×3 IMPLANT
KIT TURNOVER KIT B (KITS) ×3 IMPLANT
NDL HYPO 25GX1X1/2 BEV (NEEDLE) ×2 IMPLANT
NEEDLE HYPO 25GX1X1/2 BEV (NEEDLE) ×3 IMPLANT
NS IRRIG 1000ML POUR BTL (IV SOLUTION) ×3 IMPLANT
PACK GENERAL/GYN (CUSTOM PROCEDURE TRAY) ×3 IMPLANT
PAD ARMBOARD 7.5X6 YLW CONV (MISCELLANEOUS) ×3 IMPLANT
SPECIMEN JAR SMALL (MISCELLANEOUS) IMPLANT
STAPLER VISISTAT 35W (STAPLE) ×3 IMPLANT
STRIP CLOSURE SKIN 1/2X4 (GAUZE/BANDAGES/DRESSINGS) ×1 IMPLANT
SUT ETHILON 2 0 FS 18 (SUTURE) ×1 IMPLANT
SUT MNCRL AB 4-0 PS2 18 (SUTURE) ×3 IMPLANT
SUT VIC AB 2-0 SH 27 (SUTURE) ×3
SUT VIC AB 2-0 SH 27X BRD (SUTURE) ×2 IMPLANT
SUT VIC AB 3-0 SH 27 (SUTURE) ×3
SUT VIC AB 3-0 SH 27XBRD (SUTURE) ×2 IMPLANT
SYR CONTROL 10ML LL (SYRINGE) ×3 IMPLANT
TOWEL GREEN STERILE (TOWEL DISPOSABLE) ×3 IMPLANT
TOWEL GREEN STERILE FF (TOWEL DISPOSABLE) ×3 IMPLANT

## 2021-07-25 NOTE — Op Note (Signed)
Preoperative diagnosis: 1.  History of breast cancer 2.  Left breast skin lesions 3.  Right axillary mass Postoperative diagnosis: Same as above Procedure: 1.  Left breast skin punch biopsy x2 2.  Excision of right axillary mass, subcutaneous, 4 x 4 x 3 cm Surgical Dr. Serita Grammes Anesthesia: General Estimated blood loss: Minimal Specimens: 1.  Left breast skin punch biopsy x2 2.  Right axillary mass, clinically this was consistent with a sebaceous cyst Complications: None Drains: None Sponge and count was correct completion Disposition recovery stable condition  Indications: This is a 50 year old female with a history of stage I left breast cancer who was treated in 2015.  She has a right axillary mass that is tender that on ultrasound is 2.5 cm.  Clinically this appeared to be a sebaceous cyst.  This was causing her pain.  She also has new skin lesions on her left breast that are concerning.  I discussed removing the right axillary mass as well as punch biopsy of the skin lesions in the operating room.  Procedure: After informed consent was obtained the patient was taken the operating room.  She was given antibiotics.  SCDs were in place.  She was placed under general anesthesia without complication.  She was prepped and draped in the standard sterile surgical fashion.  A surgical timeout was then performed.  I did 2 6 mm punch biopsies of the left breast skin lesions after infiltrating Marcaine.  These were sent to pathology.  I closed these with a 3-0 nylon.  Bacitracin and Band-Aids were later applied.  I then made an elliptical incision encompassing the skin connection of the right axillary mass.  Clinically this became apparent it was a sebaceous cyst.  I then remove the mass in total.  There was no remaining cyst wall present.  And I did remove the skin connection.  I then closed this with 2-0 Vicryl, 3-0 Vicryl, and 4 Monocryl.  Glue and Steri-Strips were applied.  She tolerated  this well was extubated and transferred recovery stable.

## 2021-07-25 NOTE — Anesthesia Postprocedure Evaluation (Signed)
Anesthesia Post Note  Patient: Kimberly [name]  Procedure(s) Performed: RIGHT AXILLARY MASS EXCISION (Right: Breast) SKIN PUNCH BIOPSY LEFT BREAST (Left: Breast)     Patient location during evaluation: PACU Anesthesia Type: General Level of consciousness: awake Pain management: pain level controlled Vital Signs Assessment: post-procedure vital signs reviewed and stable Respiratory status: spontaneous breathing and respiratory function stable Cardiovascular status: stable Postop Assessment: no apparent nausea or vomiting Anesthetic complications: no   No notable events documented.  Last Vitals:  Vitals:   07/25/21 0930 07/25/21 0945  BP: (!) 126/59 132/90  Pulse: 96 85  Resp: (!) 25 13  Temp:  36.4 C  SpO2: 96% 97%    Last Pain:  Vitals:   07/25/21 0945  TempSrc:   PainSc: 2                  Merlinda Frederick

## 2021-07-25 NOTE — H&P (Signed)
50 y.o. female with history of stage I left breast cancer, right breast MRI abnormality, and right axillary accessory breast tissue who underwent left breast lumpectomy, left axillary sentinel lymph node biopsy, right breast excisional biopsy, and right axillary accessory breast tissue excision by Dr. Donne Hazel in 2015.   She is presenting per request of the [facility] who evaluated her 2 days ago on 05/24/2021. She underwent bilateral screening mammograms and was found to have a right axillary sebaceous cyst/abscess, 2.3 x 1.5 x 2.3 cm. It was also recommended that she consider a dermatologist consultation for the indeterminant skin lesions within the upper and outer left breast.  She states the "lump" in her axilla has been present for years and occasionally flares up. She states it had to be drained before and did not heal for 6 months. She denies current pain, redness, swelling, or drainage. Denies fevers.   She returns today with no real issues. She would like to consider excision of this now. She also has skin lesions on her outer left breast that have been present that have not been evaluated  Review of Systems: A complete review of systems was obtained from the patient. I have reviewed this information and discussed as appropriate with the patient. See HPI as well for other ROS.  Review of Systems  All other systems reviewed and are negative.   Medical History: Past Medical History:  Diagnosis Date   Anemia   Anxiety   COPD (chronic obstructive pulmonary disease) (CMS-HCC)   History of cancer   Patient Active Problem List  Diagnosis   Malignant neoplasm of upper-outer quadrant of left breast in female, estrogen receptor positive (CMS-HCC)   Past Surgical History:  Procedure Laterality Date   gallbladder surgery N/A    No Known Allergies  Current Outpatient Medications on File Prior to Visit  Medication Sig Dispense Refill   busPIRone (BUSPAR) 10 MG tablet buspirone 10  mg tablet TK 1 T PO BID   doxepin (SINEQUAN) 50 MG capsule doxepin 50 mg capsule TK 1 C PO Q NIGHT   FLUoxetine (PROZAC) 40 MG capsule fluoxetine 40 mg capsule TK 2 CS PO ONCE D   lamoTRIgine (LAMICTAL) 200 MG tablet lamotrigine 200 mg tablet TK 1 T PO ONCE D   montelukast (SINGULAIR) 10 mg tablet   prazosin (MINIPRESS) 5 MG capsule prazosin 5 mg capsule TK 2 CS PO Q NIGHT   QUEtiapine (SEROQUEL) 400 MG tablet quetiapine 400 mg tablet TK 1 T PO QPM   No current facility-administered medications on file prior to visit.   Family History  Problem Relation Age of Onset   Obesity Mother   High blood pressure (Hypertension) Mother   Hyperlipidemia (Elevated cholesterol) Mother   Breast cancer Mother   Myocardial Infarction (Heart attack) Mother   High blood pressure (Hypertension) Father   Hyperlipidemia (Elevated cholesterol) Father   Obesity Sister   High blood pressure (Hypertension) Sister   Hyperlipidemia (Elevated cholesterol) Sister    Social History   Tobacco Use  Smoking Status Never Smoker  Smokeless Tobacco Never Used    Social History   Socioeconomic History   Marital status: Widowed  Tobacco Use   Smoking status: Never Smoker   Smokeless tobacco: Never Used  Substance and Sexual Activity   Drug use: Never   Objective:   Physical Exam Constitutional:  Appearance: Normal appearance.  Chest:  Breasts:  Right: No mass.  Left: No mass.   Comments: Right axillary 2.5 cm mass likely  skin cyst, no current infection Left breast with multiple skin lesions, not sure etiology Lymphadenopathy:  Upper Body:  Right upper body: No supraclavicular or axillary adenopathy.  Left upper body: No supraclavicular or axillary adenopathy.  Neurological:  Mental Status: She is alert.    Assessment and Plan:   Axillary mass, right Skin lesion of breast   Plan is for excision of right axillary mass and punch biopsy of left breast skin lesions  She very much would  like the right axillary mass excised. This is symptomatic and getting larger. Has a previous history of infection with drainage. We discussed removal of this in the operating room with the risk of infection postoperatively. Due to her history of radiation with the skin lesions on the left thigh we will also plan on performing a punch biopsy of that lesion as well.

## 2021-07-25 NOTE — Interval H&P Note (Signed)
History and Physical Interval Note:  07/25/2021 8:14 AM  Kimberly [name]  has presented today for surgery, with the diagnosis of RIGHT AXILLARY MASS AND LEFT BREAST SKIN LESIONS.  The various methods of treatment have been discussed with the patient and family. After consideration of risks, benefits and other options for treatment, the patient has consented to  Procedure(s): RIGHT AXILLARY MASS EXCISION (Right) SKIN PUNCH BIOPSY LEFT BREAST (Left) as a surgical intervention.  The patient's history has been reviewed, patient examined, no change in status, stable for surgery.  I have reviewed the patient's chart and labs.  Questions were answered to the patient's satisfaction.     [name]

## 2021-07-25 NOTE — Discharge Instructions (Addendum)
[place] Office Phone Number [phone]  POST OP INSTRUCTIONS Take 400 mg of ibuprofen every 8 hours or 650 mg tylenol every 6 hours for next 72 hours then as needed. Use ice several times daily also. Always review your discharge instruction sheet given to you by the facility where your surgery was performed.  IF YOU HAVE DISABILITY OR FAMILY LEAVE FORMS, YOU MUST BRING THEM TO THE OFFICE FOR PROCESSING.  DO NOT GIVE THEM TO YOUR DOCTOR.  A prescription for pain medication may be given to you upon discharge.  Take your pain medication as prescribed, if needed.  If narcotic pain medicine is not needed, then you may take acetaminophen (Tylenol), naprosyn (Alleve) or ibuprofen (Advil) as needed. Take your usually prescribed medications unless otherwise directed If you need a refill on your pain medication, please contact your pharmacy.  They will contact our office to request authorization.  Prescriptions will not be filled after 5pm or on week-ends. You should eat very light the first 24 hours after surgery, such as soup, crackers, pudding, etc.  Resume your normal diet the day after surgery. Most patients will experience some swelling and bruising.  Ice packs will help.  Swelling and bruising can take several days to resolve.  It is common to experience some constipation if taking pain medication after surgery.  Increasing fluid intake and taking a stool softener will usually help or prevent this problem from occurring.  A mild laxative (Milk of Magnesia or Miralax) should be taken according to package directions if there are no bowel movements after 48 hours. Unless discharge instructions indicate otherwise, you may remove your bandages 48 hours after surgery and you may shower at that time.  You may have steri-strips (small skin tapes) in place directly over the incision.  These strips should be left on the skin for 7-10 days and will come off on their own.  If your surgeon used  skin glue on the incision, you may shower in 24 hours.  The glue will flake off over the next 2-3 weeks.  Any sutures or staples will be removed at the office during your follow-up visit. ACTIVITIES:  You may resume regular daily activities (gradually increasing) beginning the next day.  Wearing a good support bra or sports bra minimizes pain and swelling.  You may have sexual intercourse when it is comfortable. You may drive when you no longer are taking prescription pain medication, you can comfortably wear a seatbelt, and you can safely maneuver your car and apply brakes. RETURN TO WORK:  ______________________________________________________________________________________ Dennis Bast should see your doctor in the office for a follow-up appointment approximately two weeks after your surgery.  Your doctor's nurse will typically make your follow-up appointment when she calls you with your pathology report.  Expect your pathology report 3-4 business days after your surgery.  You may call to check if you do not hear from [name] after three days. OTHER INSTRUCTIONS: _______________________________________________________________________________________________ _____________________________________________________________________________________________________________________________________ _____________________________________________________________________________________________________________________________________ _____________________________________________________________________________________________________________________________________  WHEN TO CALL DR WAKEFIELD: Fever over 101.0 Nausea and/or vomiting. Extreme swelling or bruising. Continued bleeding from incision. Increased pain, redness, or drainage from the incision.  The clinic staff is available to answer your questions during regular business hours.  Please don't hesitate to call and ask to speak to one of the nurses for clinical  concerns.  If you have a medical emergency, go to the nearest emergency room or call 911.  A surgeon from [facility] is always on call at the hospital.  For further questions, please visit [email or web address]

## 2021-07-25 NOTE — Anesthesia Procedure Notes (Signed)
Procedure Name: LMA Insertion Date/Time: 07/25/2021 8:24 AM Performed by: Charyl Dancer, RN Pre-anesthesia Checklist: Patient identified, Emergency Drugs available, Suction available and Patient being monitored Patient Re-evaluated:Patient Re-evaluated prior to induction Oxygen Delivery Method: Circle system utilized Preoxygenation: Pre-oxygenation with 100% oxygen Induction Type: IV induction LMA: LMA inserted LMA Size: 5.0 Tube type: Oral Number of attempts: 1 Airway Equipment and Method: Stylet and Oral airway Placement Confirmation: positive ETCO2 and breath sounds checked- equal and bilateral Tube secured with: Tape Dental Injury: Teeth and Oropharynx as per pre-operative assessment

## 2021-07-25 NOTE — Transfer of Care (Signed)
Immediate Anesthesia Transfer of Care Note  Patient: Kimberly [name]  Procedure(s) Performed: RIGHT AXILLARY MASS EXCISION (Right: Breast) SKIN PUNCH BIOPSY LEFT BREAST (Left: Breast)  Patient Location: PACU  Anesthesia Type:General  Level of Consciousness: awake, alert  and oriented  Airway & Oxygen Therapy: Patient Spontanous Breathing and Patient connected to nasal cannula oxygen  Post-op Assessment: Report given to RN and Post -op Vital signs reviewed and stable  Post vital signs: Reviewed and stable  Last Vitals:  Vitals Value Taken Time  BP 131/93 07/25/21 0912  Temp    Pulse 88 07/25/21 0915  Resp 15 07/25/21 0915  SpO2 94 % 07/25/21 0915  Vitals shown include unvalidated device data.  Last Pain:  Vitals:   07/25/21 0659  TempSrc:   PainSc: 6       Patients Stated Pain Goal: 0 (33/61/22 [id number])  Complications: No notable events documented.

## 2021-07-25 NOTE — Interval H&P Note (Signed)
History and Physical Interval Note:  07/25/2021 8:12 AM  Kimberly [name]  has presented today for surgery, with the diagnosis of RIGHT AXILLARY MASS AND LEFT BREAST SKIN LESIONS.  The various methods of treatment have been discussed with the patient and family. After consideration of risks, benefits and other options for treatment, the patient has consented to  Procedure(s): RIGHT AXILLARY MASS EXCISION (Right) SKIN PUNCH BIOPSY LEFT BREAST (Left) as a surgical intervention.  The patient's history has been reviewed, patient examined, no change in status, stable for surgery.  I have reviewed the patient's chart and labs.  Questions were answered to the patient's satisfaction.     [name]

## 2021-07-26 ENCOUNTER — Encounter (HOSPITAL_COMMUNITY): Payer: Self-pay | Admitting: General Surgery

## 2021-07-27 DIAGNOSIS — H16223 Keratoconjunctivitis sicca, not specified as Sjogren's, bilateral: Secondary | ICD-10-CM | POA: Diagnosis not present

## 2021-07-27 DIAGNOSIS — H524 Presbyopia: Secondary | ICD-10-CM | POA: Diagnosis not present

## 2021-07-27 DIAGNOSIS — H2513 Age-related nuclear cataract, bilateral: Secondary | ICD-10-CM | POA: Diagnosis not present

## 2021-07-29 ENCOUNTER — Ambulatory Visit: Payer: Medicare Other

## 2021-08-01 ENCOUNTER — Inpatient Hospital Stay: Payer: Medicare Other

## 2021-08-02 LAB — SURGICAL PATHOLOGY

## 2021-08-03 ENCOUNTER — Other Ambulatory Visit: Payer: Self-pay

## 2021-08-03 ENCOUNTER — Inpatient Hospital Stay: Payer: Medicare Other | Attending: Oncology

## 2021-08-03 VITALS — BP 113/65 | HR 95 | Temp 98.2°F | Resp 20

## 2021-08-03 DIAGNOSIS — C50412 Malignant neoplasm of upper-outer quadrant of left female breast: Secondary | ICD-10-CM | POA: Insufficient documentation

## 2021-08-03 DIAGNOSIS — Z5111 Encounter for antineoplastic chemotherapy: Secondary | ICD-10-CM | POA: Insufficient documentation

## 2021-08-03 DIAGNOSIS — D509 Iron deficiency anemia, unspecified: Secondary | ICD-10-CM | POA: Insufficient documentation

## 2021-08-03 MED ORDER — SODIUM CHLORIDE 0.9 % IV SOLN: Freq: Once | INTRAVENOUS | Status: AC

## 2021-08-03 MED ORDER — SODIUM CHLORIDE 0.9 % IV SOLN
200.0000 mg | Freq: Once | INTRAVENOUS | Status: AC
  Administered 2021-08-03: 200 mg via INTRAVENOUS
  Filled 2021-08-03: qty 200

## 2021-08-03 MED ORDER — HEPARIN SOD (PORK) LOCK FLUSH 100 UNIT/ML IV SOLN
500.0000 [IU] | Freq: Once | INTRAVENOUS | Status: AC | PRN
  Administered 2021-08-03: 500 [IU]

## 2021-08-03 MED ORDER — SODIUM CHLORIDE 0.9% FLUSH
10.0000 mL | Freq: Once | INTRAVENOUS | Status: AC | PRN
  Administered 2021-08-03: 10 mL

## 2021-08-03 NOTE — Patient Instructions (Signed)

## 2021-08-07 ENCOUNTER — Encounter: Payer: Self-pay | Admitting: Physician Assistant

## 2021-08-07 ENCOUNTER — Ambulatory Visit: Payer: Medicare Other | Admitting: Physician Assistant

## 2021-08-07 VITALS — BP 110/70 | HR 108 | Ht 68.0 in | Wt 295.2 lb

## 2021-08-07 DIAGNOSIS — R195 Other fecal abnormalities: Secondary | ICD-10-CM | POA: Diagnosis not present

## 2021-08-07 DIAGNOSIS — K6289 Other specified diseases of anus and rectum: Secondary | ICD-10-CM

## 2021-08-07 DIAGNOSIS — D509 Iron deficiency anemia, unspecified: Secondary | ICD-10-CM | POA: Diagnosis not present

## 2021-08-07 DIAGNOSIS — K625 Hemorrhage of anus and rectum: Secondary | ICD-10-CM | POA: Diagnosis not present

## 2021-08-07 MED ORDER — AMBULATORY NON FORMULARY MEDICATION: 1 refills | Status: DC

## 2021-08-07 MED ORDER — PLENVU 140 G PO SOLR: 1.0000 | ORAL | 0 refills | Status: DC

## 2021-08-07 NOTE — Patient Instructions (Signed)
If you are age 49 or younger, your body mass index should be between 19-25. Your Body mass index is 44.89 kg/m. If this is out of the aformentioned range listed, please consider follow up with your Primary Care Provider.  __________________________________________________________  The [facility] providers would like to encourage you to use [facility] to communicate with providers for non-urgent requests or questions.  Due to long hold times on the telephone, sending your provider a message by [facility] may be a faster and more efficient way to get a response.  Please allow 48 business hours for a response.  Please remember that this is for non-urgent requests.   You have been scheduled for an endoscopy and colonoscopy. Please follow the written instructions given to you at your visit today. Please pick up your prep supplies at the pharmacy within the next 1-3 days. If you use inhalers (even only as needed), please bring them with you on the day of your procedure.  We have sent a prescription for Diltiazem 2% gel to [facility] for you. Using your index finger, you should apply a small amount of medication inside the rectum up to your first knuckle/joint 4 times daily x 8 weeks.  [facility]'s information is below: Address: [address], [place], [place] [address]  [phone]  *Please DO NOT go directly from our office to pick up this medication! Give the pharmacy 1 day to process the prescription as this is compounded and takes time to make.  Start Benefiber 1 dose daily as directed on packaging.  Thank you for entrusting me with your care and choosing [facility].  Amy Esterwood, [name]

## 2021-08-07 NOTE — Progress Notes (Signed)
Attending Physician's Attestation   I have reviewed the chart.   I agree with the Advanced Practitioner's note, impression, and recommendations with any updates as below. Happy to be available for procedures in setting of earlier availability than her primary GI.  Agree with endoscopic evaluation due to patient's ongoing symptoms and iron deficiency.  Hopefully things are not exacerbated in regards to her fissure with bowel preparation however.   Justice Britain, MD [facility] Advanced Endoscopy Office # [phone]

## 2021-08-07 NOTE — Progress Notes (Addendum)
Subjective:    Patient ID: Kimberly [name], female    DOB: [birth year], 50 y.o.   MRN: [id number]  HPI Kimberly [name] is a pleasant 50 year old female, established with Dr. Loletha Carrow, and referred by Dr. Jana Hakim today for evaluation of iron deficiency anemia. Patient was seen here in June 2020 and underwent colonoscopy because of the complaint of rectal bleeding.  She was noted to have internal hemorrhoids, and 1 4 mm polyp was removed from the ascending colon which was a tubular adenoma.  Otherwise negative exam.  No prior EGD. Patient has history of COPD, no oxygen requirement, morbid obesity, bipolar disorder and breast cancer stage IIa diagnosed in 2015.  She has recently been diagnosed with recurrence and has undergone staging work-up over the past month.  CT of the head and chest both negative for metastatic disease, CT of the abdomen pelvis no evidence of metastatic disease.  She has undergone a right axillary dissection on 07/25/2021 and also had biopsy to the skin lesions which were noted on the contralateral breast.  These biopsies have returned consistent with metastatic carcinoma.  She has follow-up with Dr. Jana Hakim later this week. Reviewing her labs she has been anemic since fall 2021 at which point hemoglobin was 10.5 hematocrit of 34, MCV of 76.  Hemoglobin in June 2020 normal. Most recent labs showed hemoglobin 9.1, ferritin less than 4 and iron saturation of 3.  Stool was Hemoccult negative in August 2022. She has been undergoing iron infusions with Venofer and will complete her fifth infusion later this week. She has been maintained on Protonix for GERD symptoms and says this controls her symptoms fairly well.  No complaints of dysphagia. She does state that she has had bilateral lower quadrant abdominal pain long-term and has also had problems with loose stools long-term and may have 2-4 bowel movements per day.  She will occasionally have normal bowel movements. Now over the past month  she says she has been having consistent problems with rectal bleeding.  She may pass bright red blood which she has noticed on the tissue and in the commode alternating with very dark red blood and clots also noted on the tissue and in the commode.  She may have a day or 2 without any bleeding but symptoms keep recurring.  She is concerned because she says this seems like a lot of blood.  She also has quite a bit of rectal discomfort with passage of bowel movements and thinks she may have a hemorrhoid that prolapses.  Review of Systems.Pertinent positive and negative review of systems were noted in the above HPI section.  All other review of systems was otherwise negative.   Outpatient Encounter Medications as of 08/07/2021  Medication Sig   albuterol (VENTOLIN HFA) 108 (90 Base) MCG/ACT inhaler Inhale 2 puffs into the lungs every 6 (six) hours as needed for wheezing or shortness of breath.   AMBULATORY NON FORMULARY MEDICATION Medication Name: Diltiazem 2%/ Lidocaine 5% Using your index finger, apply a small amount of medication inside the rectum up to your first knuckle/joint 4 daily x 8 weeks.   busPIRone (BUSPAR) 15 MG tablet Take 15 mg by mouth 3 (three) times daily.   cephALEXin (KEFLEX) 500 MG capsule Take 500 mg by mouth 2 (two) times daily.   diphenhydramine-acetaminophen (TYLENOL PM) 25-500 MG TABS tablet Take 2 tablets by mouth at bedtime as needed (sleep).   doxepin (SINEQUAN) 75 MG capsule Take 75 mg by mouth at bedtime.   fluticasone (FLONASE) 50  MCG/ACT nasal spray Place 2 sprays into both nostrils daily.   lidocaine-prilocaine (EMLA) cream Apply 1 application topically as needed.   montelukast (SINGULAIR) 10 MG tablet Take 1 tablet (10 mg total) by mouth at bedtime.   pantoprazole (PROTONIX) 40 MG tablet Take 1 tablet (40 mg total) by mouth daily.   PEG-KCl-NaCl-NaSulf-Na Asc-C (PLENVU) 140 g SOLR Take 1 kit by mouth as directed.   prazosin (MINIPRESS) 1 MG capsule Take 1 mg by mouth  at bedtime.   prednisoLONE acetate (PRED FORTE) 1 % ophthalmic suspension Place 1 drop into the left eye daily.   QUEtiapine (SEROQUEL) 400 MG tablet Take 400 mg by mouth at bedtime.    rosuvastatin (CRESTOR) 10 MG tablet Take 1 tablet (10 mg total) by mouth daily. (Patient taking differently: Take 10 mg by mouth at bedtime.)   Spacer/Aero-Holding Chambers (AEROCHAMBER PLUS) inhaler Use with inhaler   Tiotropium Bromide Monohydrate (SPIRIVA RESPIMAT) 1.25 MCG/ACT AERS Inhale 2 puffs into the lungs daily.   Tdap (BOOSTRIX) 5-2.5-18.5 LF-MCG/0.5 injection Inject into the muscle. (Patient not taking: Reported on 08/07/2021)   Facility-Administered Encounter Medications as of 08/07/2021  Medication   polymixin-bacitracin (POLYSPORIN) ointment   No Known Allergies Patient Active Problem List   Diagnosis Date Noted   Goals of care, counseling/discussion 07/18/2021   Microcytic anemia 07/05/2021   Left corneal abrasion 06/30/2021   History of COVID-19 01/27/2021   Elevated LDL cholesterol level 12/30/2020   COPD exacerbation ([place]) 08/24/2020   Oligouria 03/10/2020   History of breast cancer 02/11/2020   Total body pain 01/13/2019   Postmenopausal bleeding 10/25/2017   Chronic pain of both knees 05/03/2017   COPD (chronic obstructive pulmonary disease) ([place]) 03/20/2017   Tachycardia    Mixed incontinence 09/29/2015   Genetic testing 08/26/2015   Malignant neoplasm of upper-outer quadrant of left breast in female, estrogen receptor positive ([place]) 05/07/2014   Disorder of bladder 09/29/2008   LOW BACK PAIN, CHRONIC 09/29/2008   Morbid (severe) obesity due to excess calories ([place]) 11/19/2007   ANEMIA, IRON DEFICIENCY, CHRONIC 11/14/2007   Mixed bipolar I disorder ([place]) 02/20/2007   Tobacco use disorder 02/20/2007   Social History   Socioeconomic History   Marital status: Widowed    Spouse name: Not on file   Number of children: 5   Years of education: Not on file   Highest education  level: Not on file  Occupational History   Occupation: disabled  Tobacco Use   Smoking status: Every Day    Packs/day: 1.00    Years: 34.00    Pack years: 34.00    Types: Cigarettes   Smokeless tobacco: Never  Vaping Use   Vaping Use: Never used  Substance and Sexual Activity   Alcohol use: No   Drug use: No   Sexual activity: Not Currently  Other Topics Concern   Not on file  Social History Narrative   Not on file   Social Determinants of Health   Financial Resource Strain: Not on file  Food Insecurity: Not on file  Transportation Needs: Not on file  Physical Activity: Not on file  Stress: Not on file  Social Connections: Not on file  Intimate Partner Violence: Not on file    Ms. Iannaccone's family history includes Cancer in her father; Colon cancer in her maternal grandfather and paternal grandmother; Heart disease in her father.      Objective:    Vitals:   08/07/21 1026  BP: 110/70  Pulse: (!) 108  Physical Exam Well-developed well-nourished older female in no acute distress.   Weight, 295 BMI 44.8  HEENT; nontraumatic normocephalic, EOMI, PE R LA, sclera anicteric. Oropharynx; not examined today Neck; supple, no JVD Cardiovascular; regular rate and rhythm with S1-S2, no murmur rub or gallop Pulmonary; Clear bilaterally Abdomen; obese ,soft, nontender, nondistended, no palpable mass or hepatosplenomegaly, bowel sounds are active Rectal; external hemorrhoidal tag, she is exquisitely tender to digital exam, and fissure is palpable at about the 7 o'clock position, mucus but no heme on the examining glove, no palpable mass Skin; benign exam, no jaundice rash or appreciable lesions Extremities; no clubbing cyanosis or edema skin warm and dry Neuro/Psych; alert and oriented x4, grossly nonfocal mood and affect appropriate        Assessment & Plan:   #67 50 year old female with iron deficiency anemia, and complaints of almost daily rectal bleeding over the past  5 months, noticing bright red blood at times and other times dark blood and clots both on the tissue and in the commode.  She is also been having rectal pain exacerbated by defecation. , She has previously documented internal hemorrhoids, and on exam today has an anal fissure. Is not clear whether all of her bleeding is coming from hemorrhoids and/or anal fissure versus other anal rectal or colonic lesion. With significant iron deficiency anemia also consider upper GI sources.  #2 history of breast cancer stage IIa diagnosed 2015, recent diagnosis of metastatic carcinoma with skin biopsies from the breast done October 2022. CT of the head, chest abdomen and pelvis all negative for evidence of metastatic disease  #3 GERD 4.  COPD, no oxygen use 5.  Morbid obesity-BMI 44.8 6.  Bipolar disorder  Plan; patient is undergoing iron infusions with Venofer per oncology Patient will be scheduled for colonoscopy and EGD with Dr. Rush Landmark (sooner availability ).  Both procedures were discussed in detail with the patient including indications risk benefits and she is agreeable to proceed. Continue Protonix 40 mg p.o. every morning Add trial of Benefiber 2 tablespoons daily in a glass of water Start lidocaine 5% compounded with diltiazem 2% gel to be applied 1 inch into the anus 4 times daily over the next 8 weeks for treatment of the anal fissure.  She may require longer duration. Further recommendations pending findings at endoscopic evaluation. \    Reynaldo Rossman S [place] [name] 08/07/2021   Cc: Lurline Del, DO

## 2021-08-08 ENCOUNTER — Other Ambulatory Visit: Payer: Self-pay

## 2021-08-08 ENCOUNTER — Inpatient Hospital Stay: Payer: Medicare Other

## 2021-08-08 ENCOUNTER — Inpatient Hospital Stay: Payer: Medicare Other | Admitting: Oncology

## 2021-08-08 ENCOUNTER — Inpatient Hospital Stay (HOSPITAL_BASED_OUTPATIENT_CLINIC_OR_DEPARTMENT_OTHER): Payer: Medicare Other | Admitting: Oncology

## 2021-08-08 VITALS — BP 128/91 | HR 95 | Temp 98.1°F | Resp 18 | Ht 68.0 in | Wt 294.2 lb

## 2021-08-08 DIAGNOSIS — C50412 Malignant neoplasm of upper-outer quadrant of left female breast: Secondary | ICD-10-CM

## 2021-08-08 DIAGNOSIS — Z5111 Encounter for antineoplastic chemotherapy: Secondary | ICD-10-CM | POA: Diagnosis not present

## 2021-08-08 DIAGNOSIS — D509 Iron deficiency anemia, unspecified: Secondary | ICD-10-CM

## 2021-08-08 DIAGNOSIS — Z170 Estrogen receptor positive status [ER+]: Secondary | ICD-10-CM | POA: Diagnosis not present

## 2021-08-08 MED ORDER — ANASTROZOLE 1 MG PO TABS: 1.0000 mg | ORAL_TABLET | Freq: Every day | ORAL | 4 refills | Status: DC

## 2021-08-08 MED ORDER — SODIUM CHLORIDE 0.9 % IV SOLN: Freq: Once | INTRAVENOUS | Status: AC

## 2021-08-08 MED ORDER — ABEMACICLIB 50 MG PO TABS
50.0000 mg | ORAL_TABLET | Freq: Two times a day (BID) | ORAL | 6 refills | Status: DC
  Filled 2021-08-08: qty 70, 35d supply, fill #0

## 2021-08-08 MED ORDER — SODIUM CHLORIDE 0.9 % IV SOLN
200.0000 mg | Freq: Once | INTRAVENOUS | Status: AC
  Administered 2021-08-08: 200 mg via INTRAVENOUS
  Filled 2021-08-08: qty 200

## 2021-08-08 NOTE — Patient Instructions (Signed)

## 2021-08-08 NOTE — Progress Notes (Signed)
[facility]  Telephone:(336) [phone] Fax:(336) [phone]    ID: Kimberly [name] DOB: [birth year]  MR#: [id number]  YKZ#:[id number]  Patient Care Team: Lurline Del, DO as PCP - General (Family Medicine) Ludger Bones, Virgie Dad, MD as Consulting Physician (Oncology) [name], MD as Attending Physician (Radiation Oncology) [name], MD as Consulting Physician (General Surgery) Causey, Charlestine Massed, NP as Nurse Practitioner (Hematology and Oncology) [name]  CHIEF COMPLAINT: Recurrent estrogen receptor positive breast cancer; iron deficiency  CURRENT TREATMENT: Venofer; surgery pending; to start   INTERVAL HISTORY: [name] returns today for follow-up of her recurrent estrogen receptor positive breast cancer.  She underwent excision of the right axillary mass 07/25/2021.  This proved to be a benign epidermoid cyst.  She is currently on cephalexin for this.  However Punch biopsy of the left breast skin showed metastatic carcinoma, estrogen and progesterone receptor both 95% positive, moderate to strong, HER2 negative by immunohistochemistry (1+).  The accession number is [id number].    To review she underwent staging head CT on 07/14/2021, which was negative. Restaging chest CT the same day was also negative for metastatic disease.  Recall she had a CT of the abdomen and pelvis 06/18/2021 which again showed no evidence of metastatic disease.  There was no evidence of bone lesions on any of these scans.  She is scheduled for EGD and colonoscopy later this week to evaluate the cause of her anemia.Marland Kitchen  She was clearly iron deficient on presentation and received the fourth of 5 planned Venofer treatments today  Lab Results  Component Value Date   HGB 9.1 (L) 07/05/2021   HGB 10.9 (L) 06/10/2021   HGB 9.5 (L) 06/10/2021   HGB 10.8 (L) 01/04/2021   HGB 9.4 (L) 08/25/2020    REVIEW OF SYSTEMS: [name] tolerated the recent procedure well.  She  has a little bit of discomfort in the right axilla she says, but no bleeding or suppuration.  She denies unusual headaches visual changes nausea vomiting balance issues or falls.  She denies any cough phlegm production or pleurisy.  She showed me a picture of blood in the toilet after a small bowel movement.  A detailed review of systems was otherwise stable.   COVID 19 VACCINATION STATUS:    BREAST CANCER HISTORY: From the original intake note:  The patient had bilateral screening mammography with tomography 03/19/2014. This was the patient's first ever mammography. Showed a possible mass in the left breast. Left diagnostic mammography and ultrasonography 04/01/2014 showed an irregular mass in the upper outer quadrant of the left breast with a possible satellite 1 cm anterior to it. On physical exam there was a firm palpable mass at the 1:00 position of the left breast 8 cm from the nipple. There was no palpable left axillary adenopathy. Ultrasound showed an irregular hypoechoic mass in the area in question measuring 1.4 cm. There was a 4 mm nodule located anterior to this. Ultrasound of the left axilla was benign.  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 The second mass was negative for malignancy. This was felt to be concordant.  On 04/08/2014 the patient underwent bilateral breast MRI. This showed a 9 mm enhancing mass in  the subareolar area of the right breast in in the left breast, the previously noted mass measuring 1.3 cm. There were no morphologically abnormal lymph nodes  The right breast finding was followed up on 04/19/2014 with ultrasound which showed an intraductal soft tissue mass  in the inferior subareolar worsening of the right breast measuring 8 mm. This was biopsied 04/19/2014 and showed ([id number]) ectatic duct, with no evidence of malignancy. Surgical excision was recommended.  Accordingly on 05/03/2014 the patient underwent right lumpectomy, showing an intraductal papilloma, with known malignancy identified. Left lumpectomy on the same day showed an invasive ductal carcinoma measuring 1.5 cm, with one of the 2 sentinel lymph nodes positive for carcinoma. There was no extracapsular extension. Margins were clear and ample. HER-2 was repeated and was again negative.  The patient's case was discussed in the multidisciplinary breast cancer conference 05/12/2014. An Oncotype had been previously requested and this showed a recurrent score of 22, in the intermediate range. The patient qualifies for the S1007 study and this will be discussed with her. Otherwise the standard recommendation would be chemotherapy followed by radiation followed by anti-estrogens.  The patient's subsequent history is as detailed below.   PAST MEDICAL HISTORY: Past Medical History:  Diagnosis Date   Acute cholecystitis 04/17/2019   Anemia    Arthritis    Bipolar 1 disorder ([place])    Blood transfusion without reported diagnosis 2007   post bleeding childbirth   Cancer ([place])    left breast cancer 2015   COPD (chronic obstructive pulmonary disease) ([place])    COVID-19 virus infection 06/07/2019   Diabetes mellitus without complication ([place])    gestational   Hand injury, right, initial encounter 03/19/2019   Heart murmur    as a child-not adult-no cardiac work up   History of radiation therapy 08/31/14-10/21/14   left breast/ left supraclavicular 50.4 Gy 28 fx, lef tposterior axillary boost 9.52 Gy 28 fx, left rbeast boost/ 10 Gy 5 fx   Personal history of chemotherapy    Personal history of radiation therapy    Rash and nonspecific skin eruption 12/27/2020    PAST SURGICAL  HISTORY: Past Surgical History:  Procedure Laterality Date   BREAST BIOPSY Left 04/01/14   BREAST BIOPSY Right 04/19/14   BREAST LUMPECTOMY WITH AXILLARY LYMPH NODE DISSECTION Bilateral 05/03/14   CHOLECYSTECTOMY N/A 04/18/2019   Procedure: LAPAROSCOPIC CHOLECYSTECTOMY WITH INTRAOPERATIVE CHOLANGIOGRAM;  Surgeon: Jovita Kussmaul, MD;  Location: [facility];  Service: General;  Laterality: N/A;   DILATION AND CURETTAGE OF UTERUS     after childbirth   MASS EXCISION Right 07/25/2021   Procedure: RIGHT AXILLARY MASS EXCISION;  Surgeon: [name], MD;  Location: [facility];  Service: General;  Laterality: Right;   MINOR BREAST BIOPSY Left 07/25/2021   Procedure: SKIN PUNCH BIOPSY LEFT BREAST;  Surgeon: [name], MD;  Location: [facility];  Service: General;  Laterality: Left;   MULTIPLE TOOTH EXTRACTIONS     only 5 left   PORTACATH PLACEMENT Right 06/18/2014   Procedure: INSERTION PORT-A-CATH;  Surgeon: [name], MD;  Location: [facility];  Service: General;  Laterality: Right;   TONSILLECTOMY AND ADENOIDECTOMY      FAMILY HISTORY Family History  Problem Relation Age of Onset   Heart disease Father    Cancer Father        Prostate   Colon cancer Maternal Grandfather    Colon cancer Paternal Grandmother    Stomach cancer Neg Hx    Esophageal  cancer Neg Hx   The patient's father is currently living at age 40. The patient's mother was murdered at age 63 by the patient's mother's sister. The patient has 2 brothers, one sister. The patient's father hase prostate cancer, diagnosed at age 52. There is no history of breast or ovarian cancer in the family.   GYNECOLOGIC HISTORY:  Patient's last menstrual period was 07/13/2021. Menarche age 40, first live birth age 67, the patient is [place] P5. The patient has not had a period in the last 2 months but tells me she has tested for pregnancy x2 and she is not pregnant.   SOCIAL HISTORY:  she is disabled secondary to a nervous  breakdown. She is widowed-- her husband Luiz Ochoa passed away several years ago.  She lives at home with her children, ages 17, 82, and 15.  Her son was recently named valedictorian of his high school class.    ADVANCED DIRECTIVES:  not in place    HEALTH MAINTENANCE: Social History   Tobacco Use   Smoking status: Every Day    Packs/day: 1.00    Years: 34.00    Pack years: 34.00    Types: Cigarettes   Smokeless tobacco: Never  Vaping Use   Vaping Use: Never used  Substance Use Topics   Alcohol use: No   Drug use: No     Colonoscopy:  PAP:  Bone density:  Lipid panel:  No Known Allergies  Current Outpatient Medications  Medication Sig Dispense Refill   albuterol (VENTOLIN HFA) 108 (90 Base) MCG/ACT inhaler Inhale 2 puffs into the lungs every 6 (six) hours as needed for wheezing or shortness of breath. 20.1 g 1   AMBULATORY NON FORMULARY MEDICATION Medication Name: Diltiazem 2%/ Lidocaine 5% Using your index finger, apply a small amount of medication inside the rectum up to your first knuckle/joint 4 daily x 8 weeks. 38 g 1   busPIRone (BUSPAR) 15 MG tablet Take 15 mg by mouth 3 (three) times daily.     cephALEXin (KEFLEX) 500 MG capsule Take 500 mg by mouth 2 (two) times daily.     diphenhydramine-acetaminophen (TYLENOL PM) 25-500 MG TABS tablet Take 2 tablets by mouth at bedtime as needed (sleep).     doxepin (SINEQUAN) 75 MG capsule Take 75 mg by mouth at bedtime.     fluticasone (FLONASE) 50 MCG/ACT nasal spray Place 2 sprays into both nostrils daily.     lidocaine-prilocaine (EMLA) cream Apply 1 application topically as needed. 30 g 0   montelukast (SINGULAIR) 10 MG tablet Take 1 tablet (10 mg total) by mouth at bedtime. 30 tablet 3   pantoprazole (PROTONIX) 40 MG tablet Take 1 tablet (40 mg total) by mouth daily. 30 tablet 0   PEG-KCl-NaCl-NaSulf-Na Asc-C (PLENVU) 140 g SOLR Take 1 kit by mouth as directed. 1 each 0   prazosin (MINIPRESS) 1 MG capsule Take 1 mg by mouth at  bedtime.     prednisoLONE acetate (PRED FORTE) 1 % ophthalmic suspension Place 1 drop into the left eye daily.     QUEtiapine (SEROQUEL) 400 MG tablet Take 400 mg by mouth at bedtime.      rosuvastatin (CRESTOR) 10 MG tablet Take 1 tablet (10 mg total) by mouth daily. (Patient taking differently: Take 10 mg by mouth at bedtime.) 90 tablet 1   Spacer/Aero-Holding Chambers (AEROCHAMBER PLUS) inhaler Use with inhaler 1 each 2   Tdap (BOOSTRIX) 5-2.5-18.5 LF-MCG/0.5 injection Inject into the muscle. (Patient not taking: Reported on 08/07/2021)  0.5 mL 0   Tiotropium Bromide Monohydrate (SPIRIVA RESPIMAT) 1.25 MCG/ACT AERS Inhale 2 puffs into the lungs daily. 4 each 0   Current Facility-Administered Medications  Medication Dose Route Frequency Provider Last Rate Last Admin   polymixin-bacitracin (POLYSPORIN) ointment   Topical BID Brimage, Vondra, DO        OBJECTIVE: middle-aged [place] woman who appears older than stated age Vitals:   08/08/21 1409  BP: (!) 128/91  Pulse: 95  Resp: 18  Temp: 98.1 F (36.7 C)  SpO2: 100%      Body mass index is 44.73 kg/m.    ECOG FS:2 - Symptomatic, <50% confined to bed  Sclerae unicteric, EOMs intact Wearing a mask No cervical or supraclavicular adenopathy Lungs no rales or rhonchi Heart regular rate and rhythm Abd soft, obese, nontender, positive bowel sounds MSK no focal spinal tenderness Neuro: nonfocal, well oriented, appropriate affect Breasts: The right breast is unremarkable.  The incision in the right axilla appears to be healing well, without erythema or dehiscence.  The left breast is imaged below.  It is firm, with restricted mobility, and has obvious cutaneous lesions, recently biopsied Skin: Careful inspection of the skin of the chest front and back fine no subcutaneous lesions outside the left breast itself  Left breast 08/08/2021    LAB RESULTS:  CMP     Component Value Date/Time   NA 138 07/05/2021 0939   NA 138 03/10/2020  1534   NA 139 01/19/2015 1422   K 3.7 07/05/2021 0939   K 3.8 01/19/2015 1422   CL 105 07/05/2021 0939   CO2 24 07/05/2021 0939   CO2 23 01/19/2015 1422   GLUCOSE 92 07/05/2021 0939   GLUCOSE 93 01/19/2015 1422   BUN 7 07/05/2021 0939   BUN 8 03/10/2020 1534   BUN 8.6 01/19/2015 1422   CREATININE 1.02 (H) 07/05/2021 0939   CREATININE 1.1 01/19/2015 1422   CALCIUM 9.2 07/05/2021 0939   CALCIUM 9.3 01/19/2015 1422   PROT 7.7 07/05/2021 0939   PROT 7.2 02/11/2020 1626   PROT 7.3 01/19/2015 1422   ALBUMIN 4.0 07/05/2021 0939   ALBUMIN 4.4 02/11/2020 1626   ALBUMIN 3.9 01/19/2015 1422   AST 10 (L) 07/05/2021 0939   AST 14 01/19/2015 1422   ALT <6 07/05/2021 0939   ALT 13 01/19/2015 1422   ALKPHOS 68 07/05/2021 0939   ALKPHOS 82 01/19/2015 1422   BILITOT 0.3 07/05/2021 0939   BILITOT 0.35 01/19/2015 1422   GFRNONAA >60 07/05/2021 0939   GFRAA 76 03/10/2020 1534    I No results found for: SPEP  Lab Results  Component Value Date   WBC 5.4 07/05/2021   NEUTROABS 2.9 07/05/2021   HGB 9.1 (L) 07/05/2021   HCT 30.0 (L) 07/05/2021   MCV 71.3 (L) 07/05/2021   PLT 292 07/05/2021      Chemistry      Component Value Date/Time   NA 138 07/05/2021 0939   NA 138 03/10/2020 1534   NA 139 01/19/2015 1422   K 3.7 07/05/2021 0939   K 3.8 01/19/2015 1422   CL 105 07/05/2021 0939   CO2 24 07/05/2021 0939   CO2 23 01/19/2015 1422   BUN 7 07/05/2021 0939   BUN 8 03/10/2020 1534   BUN 8.6 01/19/2015 1422   CREATININE 1.02 (H) 07/05/2021 0939   CREATININE 1.1 01/19/2015 1422      Component Value Date/Time   CALCIUM 9.2 07/05/2021 0939   CALCIUM 9.3 01/19/2015 1422  ALKPHOS 68 07/05/2021 0939   ALKPHOS 82 01/19/2015 1422   AST 10 (L) 07/05/2021 0939   AST 14 01/19/2015 1422   ALT <6 07/05/2021 0939   ALT 13 01/19/2015 1422   BILITOT 0.3 07/05/2021 0939   BILITOT 0.35 01/19/2015 1422       Lab Results  Component Value Date   LABCA2 28 04/30/2014    No components  found for: [id number]  No results for input(s): INR in the last 168 hours.  Urinalysis    Component Value Date/Time   COLORURINE YELLOW 03/07/2017 2111   APPEARANCEUR CLEAR 03/07/2017 2111   LABSPEC 1.016 03/07/2017 2111   PHURINE 7.0 03/07/2017 2111   GLUCOSEU NEGATIVE 03/07/2017 2111   HGBUR NEGATIVE 03/07/2017 2111   BILIRUBINUR negative 03/10/2020 1515   KETONESUR negative 03/10/2020 1515   [place] 03/07/2017 2111   PROTEINUR =30 (A) 03/10/2020 1515   PROTEINUR NEGATIVE 03/07/2017 2111   UROBILINOGEN 0.2 03/10/2020 1515   UROBILINOGEN 0.2 07/10/2014 1951   NITRITE Negative 03/10/2020 1515   NITRITE NEGATIVE 03/07/2017 2111   LEUKOCYTESUR Negative 03/10/2020 1515    STUDIES: CT HEAD W & WO CONTRAST (5MM)  Result Date: 07/15/2021 CLINICAL DATA:  Breast cancer staging EXAM: CT HEAD WITHOUT AND WITH CONTRAST TECHNIQUE: Contiguous axial images were obtained from the base of the skull through the vertex without and with intravenous contrast CONTRAST:  50mL OMNIPAQUE IOHEXOL 350 MG/ML SOLN COMPARISON:  None. FINDINGS: Brain: No evidence of acute infarction, hemorrhage, hydrocephalus, extra-axial collection or mass lesion/mass effect. Normal enhancement. Negative for metastatic disease. No edema in the brain. Vascular: Negative for hyperdense vessel. Normal vascular enhancement Skull: No suspicious skeletal lesion. Sinuses/Orbits: Small osteoma right ethmoid sinus, benign-appearing. No significant mucosal edema. Negative orbit Other: None IMPRESSION: Negative CT head with contrast.  Negative for metastatic disease. Electronically Signed   By: Franchot Gallo M.D.   On: 07/15/2021 10:34   CT Chest W Contrast  Result Date: 07/17/2021 CLINICAL DATA:  50 year old female with history of breast cancer diagnosed in 2016 status post lumpectomy, chemotherapy and radiation therapy which is now complete. Evaluate for metastatic disease. EXAM: CT CHEST WITH CONTRAST TECHNIQUE: Multidetector  CT imaging of the chest was performed during intravenous contrast administration. CONTRAST:  23mL OMNIPAQUE IOHEXOL 350 MG/ML SOLN COMPARISON:  Chest CT 10/25/2019. FINDINGS: Cardiovascular: Heart size is normal. There is no significant pericardial fluid, thickening or pericardial calcification. Aortic atherosclerosis. No definite coronary artery calcifications. Mediastinum/Nodes: No pathologically enlarged mediastinal, hilar or internal mammary lymph nodes. Esophagus is unremarkable in appearance. No axillary lymphadenopathy. Surgical clips in the left axilla from prior lymph node dissection. Lungs/Pleura: No suspicious appearing pulmonary nodules or masses are noted. No acute consolidative airspace disease. No pleural effusions. Upper Abdomen: Status post cholecystectomy. Low-attenuation lesions in the liver, incompletely characterized on today's examination, but statistically likely to represent small cysts. Musculoskeletal: There are no aggressive appearing lytic or blastic lesions noted in the visualized portions of the skeleton. IMPRESSION: 1. No findings to suggest metastatic disease in the thorax. 2. Aortic atherosclerosis. Aortic Atherosclerosis (ICD10-I70.0). Electronically Signed   By: Vinnie Langton M.D.   On: 07/17/2021 09:32     ASSESSMENT: 50 y.o. BRCA negative [place] woman status post left lumpectomy and sentinel lymph node sampling 05/03/2014 for a pT1c pN0, stage IA invasive ductal carcinoma, grade 2, estrogen receptor 83% positive, progesterone receptor 66% positive, with an MIB-1 of 14% and no HER-2 amplification.  (1) Oncotype score of 22 predicts a risk of outside the  breast recurrence within 10 years of 13% if the patient's only systemic therapy is tamoxifen for 5 years.  (2) adjuvant chemotherapy with cyclophosphamide and docetaxel started 07/06/2014, patient tolerated chemotherapy very poorly and it was discontinued after one cycle.    (3) adjuvant radiation completed  10/21/2014 1) Left Breast / 50.4 Gy in 28 fractions 2) Left supraclavicular / 50.4 Gy in 28 fractions 3) Left Posterior axillary boost  / 9.52 Gy in 28 fractions 4) Left Breast Boost / 10 Gy in 5 fractions  (4) anastrozole started 01/19/2015-[name] informed me she never started this.   METASTATIC DISEASE to skin: September 2022 (5) mammography 05/24/2021 shows fluid collection in the right axilla, no obvious malignancy within each breast but superficial skin lesions throughout the upper outer left breast.  (A) CT scan of the abdomen and pelvis with contrast 06/10/2021 shows small low-attenuation liver lesions which could be cysts but no definite evidence of metastatic disease.  (B) chest CT scan 07/14/2021 shows no evidence of metastatic disease  (C) CT head with and without contrast on 07/15/2021 shows no evidence of metastatic disease (D) biopsy left breast skin lesion 07/25/2021 shows metastatic carcinoma, estrogen and progesterone receptor positive, HER2 not amplified (1+)  (E) excision right axillary mass 07/25/2021 shows benign epidermoid cyst (E) CA 27-29 on 07/05/2021 is normal at 21.1  (6) iron deficiency anemia: On 07/05/2021 the ferritin was less than 4 and the iron saturation 3%; hemoglobin was 9.1 with an MCV of 71.3  (A) Venofer started on 07/18/2021, to be repeated x5   (B) GI evaluation 08/11/2021  (7) to start anastrozole 08/15/2021  (A) to start abemaciclib 50 mg twice daily starting 08/15/2021  (8) palliative left mastectomy planned  PLAN: Dashauna has biopsy-proven disease recurrence involving the left breast.  We have not found evidence of disease outside of the left breast including careful palpation of the skin of the chest wall anteriorly and posteriorly.  Accordingly prophylactic left mastectomy is a consideration for local control, to be followed by adjuvant radiation.  I have discussed this with her surgeon Dr. Donne Hazel and we are obtaining a breast MRI  ASAP to further clarify that option.  Systemically she will be treated with anastrozole for the antiestrogen and abemaciclib for the CDK 4 inhibitor.  Today we discussed the possible toxicities side effects and complications of these agents.  She will also meet with our clinical pharmacologist next week to discuss these further.  She has a GI work-up in process to evaluate the reason for her iron deficiency and bright red blood per rectum.  Assuming all goes well she will meet me again 08/29/2021 with her next Xgeva dose.  She knows to call for any other issue that may develop before then.  Total encounter time 35 minutes.Sarajane Jews C. Quavis Klutz, MD 08/08/21 5:25 PM Medical Oncology and Hematology [facility] [place], [place] [address] Tel. [phone]    Fax. [phone]   I, Wilburn Mylar, am acting as scribe for Dr. Virgie Dad. Shatora [place].  I, Lurline Del MD, have reviewed the above documentation for accuracy and completeness, and I agree with the above.    *Total Encounter Time as defined by the Centers for Medicare and Medicaid Services includes, in addition to the face-to-face time of a patient visit (documented in the note above) non-face-to-face time: obtaining and reviewing outside history, ordering and reviewing medications, tests or procedures, care coordination (communications with other health care professionals or caregivers) and documentation  in the medical record.

## 2021-08-08 NOTE — Progress Notes (Signed)
I appreciate the note and agree with the plan. Thanks for help with earlier endoscopic availability.  Sounds like she will most likely need hemorrhoidal banding afterward- GM can communicate reports to me.  Also, Hgb is likely improved from 07/05/21 value of 9.1 with recent iron infusions, but I recommend she have a CBC in the morning of 10/20 to be sure acceptable before she starts bowel prep.   - HD

## 2021-08-09 ENCOUNTER — Other Ambulatory Visit: Payer: Self-pay

## 2021-08-09 ENCOUNTER — Telehealth: Payer: Self-pay

## 2021-08-09 ENCOUNTER — Other Ambulatory Visit (HOSPITAL_COMMUNITY): Payer: Self-pay

## 2021-08-09 DIAGNOSIS — D509 Iron deficiency anemia, unspecified: Secondary | ICD-10-CM

## 2021-08-09 DIAGNOSIS — Z170 Estrogen receptor positive status [ER+]: Secondary | ICD-10-CM

## 2021-08-09 NOTE — Telephone Encounter (Signed)
Oral Oncology Patient Advocate Encounter  Prior Authorization for Melynda Keller has been approved.    PA# [id number] Effective dates: 08/09/21 through 10/21/22  Patients co-pay is $2720  Oral Oncology Clinic will continue to follow.   Ruth Patient [place] Phone [phone] Fax [phone] 08/09/2021 10:49 AM

## 2021-08-09 NOTE — Telephone Encounter (Signed)
Oral Oncology Pharmacist Encounter  Received new prescription for abemaciclib (Verzenio) for the treatment of hormone receptor positive, HER-2 negative metastatic breast cancer in conjunction with anastrozole, planned duration until disease progression or unacceptable toxicity.  Prescription dose and frequency assessed, no interventions needed.  Labs from 07/05/2021 assessed, creatinine slightly increased to 1.02, no interventions needed.  Current medication list in Epic reviewed, DDIs with verzenio identified: none  Evaluated chart and no patient barriers to medication adherence noted.   Patient agreement for treatment documented in MD note on 08/08/2021.  Prescription has been e-scribed to the [facility] for benefits analysis and approval.  Oral Oncology Clinic will continue to follow for insurance authorization, copayment issues, initial counseling and start date.  Drema Halon, PharmD Hematology/Oncology Clinical Pharmacist [facility] [phone] 08/09/2021 8:57 AM

## 2021-08-09 NOTE — Telephone Encounter (Signed)
-----   Message from Alfredia Ferguson, [name] sent at 08/08/2021 12:19 PM EDT ----- Regarding: labs Beth - pt is scheduled for procedures with mansouraty on 10/20 I believe - pleas have her come get a cbc that morning prior to procedure -thank you! She is anemic

## 2021-08-09 NOTE — Telephone Encounter (Signed)
Patient is scheduled for her procedure on 08/11/21. I called the patient. She is able to come for the lab late Thursday 08/10/21 for the CBC. She is not able to come Friday morning 08/11/21. Her procedure is at 3:30 pm on 08/11/21. Patient agrees to come for the labs on 08/10/21.

## 2021-08-09 NOTE — Telephone Encounter (Signed)
Oral Oncology Patient Advocate Encounter   Received notification from [employer] that prior authorization for Verzenio is required.   PA submitted on CoverMyMeds Key [id number] Status is pending   Oral Oncology Clinic will continue to follow.   Kimberly [name] Patient [place] Phone [phone] Fax [phone] 08/09/2021 8:26 AM

## 2021-08-10 ENCOUNTER — Telehealth: Payer: Self-pay | Admitting: *Deleted

## 2021-08-10 ENCOUNTER — Other Ambulatory Visit (INDEPENDENT_AMBULATORY_CARE_PROVIDER_SITE_OTHER): Payer: Medicare Other

## 2021-08-10 ENCOUNTER — Telehealth: Payer: Self-pay

## 2021-08-10 DIAGNOSIS — D509 Iron deficiency anemia, unspecified: Secondary | ICD-10-CM

## 2021-08-10 LAB — CBC WITH DIFFERENTIAL/PLATELET
Basophils Absolute: 0.1 10*3/uL (ref 0.0–0.1)
Basophils Relative: 0.8 % (ref 0.0–3.0)
Eosinophils Absolute: 0 10*3/uL (ref 0.0–0.7)
Eosinophils Relative: 0.1 % (ref 0.0–5.0)
HCT: 34.3 % — ABNORMAL LOW (ref 36.0–46.0)
Hemoglobin: 10.4 g/dL — ABNORMAL LOW (ref 12.0–15.0)
Lymphocytes Relative: 34.2 % (ref 12.0–46.0)
Lymphs Abs: 2.2 10*3/uL (ref 0.7–4.0)
MCHC: 30.4 g/dL (ref 30.0–36.0)
MCV: 74 fl — ABNORMAL LOW (ref 78.0–100.0)
Monocytes Absolute: 0.6 10*3/uL (ref 0.1–1.0)
Monocytes Relative: 9.9 % (ref 3.0–12.0)
Neutro Abs: 3.6 10*3/uL (ref 1.4–7.7)
Neutrophils Relative %: 55 % (ref 43.0–77.0)
Platelets: 282 10*3/uL (ref 150.0–400.0)
RBC: 4.64 Mil/uL (ref 3.87–5.11)
RDW: 25.7 % — ABNORMAL HIGH (ref 11.5–15.5)
WBC: 6.5 10*3/uL (ref 4.0–10.5)

## 2021-08-10 NOTE — Telephone Encounter (Signed)
New Miralax instructions mailed to [email or web address]

## 2021-08-10 NOTE — Telephone Encounter (Signed)
Oral Oncology Patient Advocate Encounter  Met patient in [place] to complete an application for [employer] Patient Assistance Program in an effort to reduce the patient's out of pocket expense for Verzenio to $0.    Application completed and faxed to [phone].   [employer] patient assistance phone number for follow up is [phone].   This encounter will be updated until final determination.     [place] [facility] [place] Phone [phone] Fax [phone] 08/10/2021 2:52 PM

## 2021-08-11 ENCOUNTER — Ambulatory Visit (AMBULATORY_SURGERY_CENTER): Payer: Medicare Other | Admitting: Gastroenterology

## 2021-08-11 ENCOUNTER — Encounter: Payer: Self-pay | Admitting: Gastroenterology

## 2021-08-11 VITALS — BP 161/78 | HR 84 | Temp 97.8°F | Resp 20 | Ht 68.0 in | Wt 294.0 lb

## 2021-08-11 DIAGNOSIS — K625 Hemorrhage of anus and rectum: Secondary | ICD-10-CM

## 2021-08-11 DIAGNOSIS — K642 Third degree hemorrhoids: Secondary | ICD-10-CM

## 2021-08-11 DIAGNOSIS — K449 Diaphragmatic hernia without obstruction or gangrene: Secondary | ICD-10-CM | POA: Diagnosis not present

## 2021-08-11 DIAGNOSIS — K2970 Gastritis, unspecified, without bleeding: Secondary | ICD-10-CM

## 2021-08-11 DIAGNOSIS — D509 Iron deficiency anemia, unspecified: Secondary | ICD-10-CM

## 2021-08-11 DIAGNOSIS — R1030 Lower abdominal pain, unspecified: Secondary | ICD-10-CM | POA: Diagnosis not present

## 2021-08-11 DIAGNOSIS — R12 Heartburn: Secondary | ICD-10-CM | POA: Diagnosis not present

## 2021-08-11 DIAGNOSIS — K2950 Unspecified chronic gastritis without bleeding: Secondary | ICD-10-CM | POA: Diagnosis not present

## 2021-08-11 MED ORDER — ABEMACICLIB 50 MG PO TABS: 50.0000 mg | ORAL_TABLET | Freq: Two times a day (BID) | ORAL | 6 refills | Status: DC

## 2021-08-11 MED ORDER — SODIUM CHLORIDE 0.9 % IV SOLN: 500.0000 mL | Freq: Once | INTRAVENOUS | Status: DC

## 2021-08-11 MED ORDER — HYDROCORTISONE ACETATE 25 MG RE SUPP
RECTAL | 0 refills | Status: AC
Start: 2021-08-11 — End: ?

## 2021-08-11 NOTE — Progress Notes (Signed)
Called to room to assist during endoscopic procedure.  Patient ID and intended procedure confirmed with present staff. Received instructions for my participation in the procedure from the performing physician.  

## 2021-08-11 NOTE — Telephone Encounter (Signed)
Patient is approved for Verzenio at no cost from [employer].  [employer] uses [place] [facility] [place] Phone [phone] Fax [phone] 08/11/2021 8:32 AM

## 2021-08-11 NOTE — Patient Instructions (Signed)
Handout given on hemorrhoid banding.  Use FiberCon 1-2 tablets daily. (OTC medication) Pick up prescription from [facility] off [address]:   Refer to the procedure report that was given to you for any specific questions about what was found during the examination.  If the procedure report does not answer your questions, please call your gastroenterologist to clarify.  If you requested that your care partner not be given the details of your procedure findings, then the procedure report has been included in a sealed envelope for you to review at your convenience later.  YOU SHOULD EXPECT: Some feelings of bloating in the abdomen. Passage of more gas than usual.  Walking can help get rid of the air that was put into your GI tract during the procedure and reduce the bloating. If you had a lower endoscopy (such as a colonoscopy or flexible sigmoidoscopy) you may notice spotting of blood in your stool or on the toilet paper. If you underwent a bowel prep for your procedure, you may not have a normal bowel movement for a few days.  Please Note:  You might notice some irritation and congestion in your nose or some drainage.  This is from the oxygen used during your procedure.  There is no need for concern and it should clear up in a day or so.  SYMPTOMS TO REPORT IMMEDIATELY:  Following lower endoscopy (colonoscopy or flexible sigmoidoscopy):  Excessive amounts of blood in the stool  Significant tenderness or worsening of abdominal pains  Swelling of the abdomen that is new, acute  Fever of 100F or higher  Following upper endoscopy (EGD)  Vomiting of blood or coffee ground material  New chest pain or pain under the shoulder blades  Painful or persistently difficult swallowing  New shortness of breath  Fever of 100F or higher  Black, tarry-looking stools  For urgent or emergent issues, a gastroenterologist can  be reached at any hour by calling [phone]. Do not use MyChart messaging for urgent concerns.    DIET:  We do recommend a small meal at first, but then you may proceed to your regular diet.  Drink plenty of fluids but you should avoid alcoholic beverages for 24 hours.  ACTIVITY:  You should plan to take it easy for the rest of today and you should NOT DRIVE or use heavy machinery until tomorrow (because of the sedation medicines used during the test).    FOLLOW UP: Our staff will call the number listed on your records 48-72 hours following your procedure to check on you and address any questions or concerns that you may have regarding the information given to you following your procedure. If we do not reach you, we will leave a message.  We will attempt to reach you two times.  During this call, we will ask if you have developed any symptoms of COVID 19. If you develop any symptoms (ie: fever, flu-like symptoms, shortness of breath, cough etc.) before then, please call [phone].  If you test positive for Covid 19 in the 2 weeks post procedure, please call and report this information to [name].    If any biopsies were taken you will be contacted by phone or by letter within the next 1-3 weeks.  Please call us at [phone] if you have not heard about the biopsies in 3 weeks.    SIGNATURES/CONFIDENTIALITY: You and/or your care partner have signed  paperwork which will be entered into your electronic medical record.  These signatures attest to the fact that that the information above on your After Visit Summary has been reviewed and is understood.  Full responsibility of the confidentiality of this discharge information lies with you and/or your care-partner.

## 2021-08-11 NOTE — Op Note (Signed)
[place] Patient Name: Kimberly [name] Procedure Date: 08/11/2021 3:25 PM MRN: [id number] Endoscopist: Justice Britain , MD Age: 50 Referring MD:  Date of Birth: October 30, 1970 Gender: Female Account #: [id number] Procedure:                Upper GI endoscopy Indications:              Lower abdominal pain, Iron deficiency anemia,                            Heartburn, Recent gastrointestinal bleeding Medicines:                Monitored Anesthesia Care Procedure:                Pre-Anesthesia Assessment:                           - Prior to the procedure, a History and Physical                            was performed, and patient medications and                            allergies were reviewed. The patient's tolerance of                            previous anesthesia was also reviewed. The risks                            and benefits of the procedure and the sedation                            options and risks were discussed with the patient.                            All questions were answered, and informed consent                            was obtained. Prior Anticoagulants: The patient has                            taken no previous anticoagulant or antiplatelet                            agents. ASA Grade Assessment: III - A patient with                            severe systemic disease. After reviewing the risks                            and benefits, the patient was deemed in                            satisfactory condition to undergo the procedure.  After obtaining informed consent, the endoscope was                            passed under direct vision. Throughout the                            procedure, the patient's blood pressure, pulse, and                            oxygen saturations were monitored continuously. The                            GIF [id number] was introduced through the                            mouth,  and advanced to the second part of duodenum.                            The upper GI endoscopy was accomplished without                            difficulty. The patient tolerated the procedure. Scope In: Scope Out: Findings:                 No gross lesions were noted in the entire esophagus.                           The Z-line was irregular and was found 39 cm from                            the incisors.                           A 2 cm hiatal hernia was present.                           Patchy mildly erythematous mucosa without bleeding                            was found in the entire examined stomach. Biopsies                            were taken with a cold forceps for histology and                            Helicobacter pylori testing.                           No gross lesions were noted in the duodenal bulb,                            in the first portion of the duodenum and in the  second portion of the duodenum. Biopsies for                            histology were taken with a cold forceps for                            evaluation of celiac disease. Complications:            No immediate complications. Estimated Blood Loss:     Estimated blood loss was minimal. Impression:               - No gross lesions in esophagus.                           - Z-line irregular, 39 cm from the incisors.                           - 2 cm hiatal hernia.                           - Erythematous mucosa in the stomach. Biopsied.                           - No gross lesions in the duodenal bulb, in the                            first portion of the duodenum and in the second                            portion of the duodenum. Biopsied. Recommendation:           - Proceed to scheduled colonoscopy.                           - Continue present medications.                           - Await pathology results.                           - The findings and  recommendations were discussed                            with the patient.                           - The findings and recommendations were discussed                            with the patient's family. Justice Britain, MD 08/11/2021 4:07:29 PM

## 2021-08-11 NOTE — Progress Notes (Signed)
GASTROENTEROLOGY PROCEDURE H&P NOTE   Primary Care Physician: Lurline Del, DO  HPI: Kimberly [name] is a 50 y.o. female who presents for EGD/colonoscopy for evaluation of rectal bleeding, abdominal pain, anal fissure, iron deficiency anemia.  Past Medical History:  Diagnosis Date   Acute cholecystitis 04/17/2019   Anemia    Arthritis    Bipolar 1 disorder ([place])    Blood transfusion without reported diagnosis 2007   post bleeding childbirth   Cancer ([place])    left breast cancer 2015   COPD (chronic obstructive pulmonary disease) ([place])    COVID-19 virus infection 06/07/2019   Diabetes mellitus without complication ([place])    gestational   Hand injury, right, initial encounter 03/19/2019   Heart murmur    as a child-not adult-no cardiac work up   History of radiation therapy 08/31/14-10/21/14   left breast/ left supraclavicular 50.4 Gy 28 fx, lef tposterior axillary boost 9.52 Gy 28 fx, left rbeast boost/ 10 Gy 5 fx   Personal history of chemotherapy    Personal history of radiation therapy    Rash and nonspecific skin eruption 12/27/2020   Past Surgical History:  Procedure Laterality Date   BREAST BIOPSY Left 04/01/14   BREAST BIOPSY Right 04/19/14   BREAST LUMPECTOMY WITH AXILLARY LYMPH NODE DISSECTION Bilateral 05/03/14   CHOLECYSTECTOMY N/A 04/18/2019   Procedure: LAPAROSCOPIC CHOLECYSTECTOMY WITH INTRAOPERATIVE CHOLANGIOGRAM;  Surgeon: Jovita Kussmaul, MD;  Location: [facility];  Service: General;  Laterality: N/A;   DILATION AND CURETTAGE OF UTERUS     after childbirth   MASS EXCISION Right 07/25/2021   Procedure: RIGHT AXILLARY MASS EXCISION;  Surgeon: [name], MD;  Location: [facility];  Service: General;  Laterality: Right;   MINOR BREAST BIOPSY Left 07/25/2021   Procedure: SKIN PUNCH BIOPSY LEFT BREAST;  Surgeon: [name], MD;  Location: [facility];  Service: General;  Laterality: Left;   MULTIPLE TOOTH EXTRACTIONS     only 5 left   PORTACATH PLACEMENT Right  06/18/2014   Procedure: INSERTION PORT-A-CATH;  Surgeon: [name], MD;  Location: [facility];  Service: General;  Laterality: Right;   TONSILLECTOMY AND ADENOIDECTOMY     Current Outpatient Medications  Medication Sig Dispense Refill   abemaciclib (VERZENIO) 50 MG tablet Take 1 tablet (50 mg total) by mouth 2 (two) times daily. Swallow tablets whole. Do not chew, crush, or split tablets before swallowing. 60 tablet 6   albuterol (VENTOLIN HFA) 108 (90 Base) MCG/ACT inhaler Inhale 2 puffs into the lungs every 6 (six) hours as needed for wheezing or shortness of breath. 20.1 g 1   AMBULATORY NON FORMULARY MEDICATION Medication Name: Diltiazem 2%/ Lidocaine 5% Using your index finger, apply a small amount of medication inside the rectum up to your first knuckle/joint 4 daily x 8 weeks. 38 g 1   anastrozole (ARIMIDEX) 1 MG tablet Take 1 tablet (1 mg total) by mouth daily. 90 tablet 4   busPIRone (BUSPAR) 15 MG tablet Take 15 mg by mouth 3 (three) times daily.     cephALEXin (KEFLEX) 500 MG capsule Take 500 mg by mouth 2 (two) times daily.     diphenhydramine-acetaminophen (TYLENOL PM) 25-500 MG TABS tablet Take 2 tablets by mouth at bedtime as needed (sleep).     doxepin (SINEQUAN) 75 MG capsule Take 75 mg by mouth at bedtime.     fluticasone (FLONASE) 50 MCG/ACT nasal spray Place 2 sprays into both nostrils daily.     lidocaine-prilocaine (EMLA) cream Apply  1 application topically as needed. 30 g 0   montelukast (SINGULAIR) 10 MG tablet Take 1 tablet (10 mg total) by mouth at bedtime. 30 tablet 3   pantoprazole (PROTONIX) 40 MG tablet Take 1 tablet (40 mg total) by mouth daily. 30 tablet 0   PEG-KCl-NaCl-NaSulf-Na Asc-C (PLENVU) 140 g SOLR Take 1 kit by mouth as directed. 1 each 0   prazosin (MINIPRESS) 1 MG capsule Take 1 mg by mouth at bedtime.     prednisoLONE acetate (PRED FORTE) 1 % ophthalmic suspension Place 1 drop into the left eye daily.     QUEtiapine (SEROQUEL) 400  MG tablet Take 400 mg by mouth at bedtime.      rosuvastatin (CRESTOR) 10 MG tablet Take 1 tablet (10 mg total) by mouth daily. (Patient taking differently: Take 10 mg by mouth at bedtime.) 90 tablet 1   Spacer/Aero-Holding Chambers (AEROCHAMBER PLUS) inhaler Use with inhaler 1 each 2   Tdap (BOOSTRIX) 5-2.5-18.5 LF-MCG/0.5 injection Inject into the muscle. (Patient not taking: Reported on 08/07/2021) 0.5 mL 0   Tiotropium Bromide Monohydrate (SPIRIVA RESPIMAT) 1.25 MCG/ACT AERS Inhale 2 puffs into the lungs daily. 4 each 0   Current Facility-Administered Medications  Medication Dose Route Frequency Provider Last Rate Last Admin   0.9 %  sodium chloride infusion  500 mL Intravenous Once Mansouraty, Telford Nab., MD       polymixin-bacitracin (POLYSPORIN) ointment   Topical BID Lyndee Hensen, DO        Current Outpatient Medications:    abemaciclib (VERZENIO) 50 MG tablet, Take 1 tablet (50 mg total) by mouth 2 (two) times daily. Swallow tablets whole. Do not chew, crush, or split tablets before swallowing., Disp: 60 tablet, Rfl: 6   albuterol (VENTOLIN HFA) 108 (90 Base) MCG/ACT inhaler, Inhale 2 puffs into the lungs every 6 (six) hours as needed for wheezing or shortness of breath., Disp: 20.1 g, Rfl: 1   AMBULATORY NON FORMULARY MEDICATION, Medication Name: Diltiazem 2%/ Lidocaine 5% Using your index finger, apply a small amount of medication inside the rectum up to your first knuckle/joint 4 daily x 8 weeks., Disp: 38 g, Rfl: 1   anastrozole (ARIMIDEX) 1 MG tablet, Take 1 tablet (1 mg total) by mouth daily., Disp: 90 tablet, Rfl: 4   busPIRone (BUSPAR) 15 MG tablet, Take 15 mg by mouth 3 (three) times daily., Disp: , Rfl:    cephALEXin (KEFLEX) 500 MG capsule, Take 500 mg by mouth 2 (two) times daily., Disp: , Rfl:    diphenhydramine-acetaminophen (TYLENOL PM) 25-500 MG TABS tablet, Take 2 tablets by mouth at bedtime as needed (sleep)., Disp: , Rfl:    doxepin (SINEQUAN) 75 MG capsule, Take  75 mg by mouth at bedtime., Disp: , Rfl:    fluticasone (FLONASE) 50 MCG/ACT nasal spray, Place 2 sprays into both nostrils daily., Disp: , Rfl:    lidocaine-prilocaine (EMLA) cream, Apply 1 application topically as needed., Disp: 30 g, Rfl: 0   montelukast (SINGULAIR) 10 MG tablet, Take 1 tablet (10 mg total) by mouth at bedtime., Disp: 30 tablet, Rfl: 3   pantoprazole (PROTONIX) 40 MG tablet, Take 1 tablet (40 mg total) by mouth daily., Disp: 30 tablet, Rfl: 0   PEG-KCl-NaCl-NaSulf-Na Asc-C (PLENVU) 140 g SOLR, Take 1 kit by mouth as directed., Disp: 1 each, Rfl: 0   prazosin (MINIPRESS) 1 MG capsule, Take 1 mg by mouth at bedtime., Disp: , Rfl:    prednisoLONE acetate (PRED FORTE) 1 % ophthalmic suspension, Place 1  drop into the left eye daily., Disp: , Rfl:    QUEtiapine (SEROQUEL) 400 MG tablet, Take 400 mg by mouth at bedtime. , Disp: , Rfl:    rosuvastatin (CRESTOR) 10 MG tablet, Take 1 tablet (10 mg total) by mouth daily. (Patient taking differently: Take 10 mg by mouth at bedtime.), Disp: 90 tablet, Rfl: 1   Spacer/Aero-Holding Chambers (AEROCHAMBER PLUS) inhaler, Use with inhaler, Disp: 1 each, Rfl: 2   Tdap (BOOSTRIX) 5-2.5-18.5 LF-MCG/0.5 injection, Inject into the muscle. (Patient not taking: Reported on 08/07/2021), Disp: 0.5 mL, Rfl: 0   Tiotropium Bromide Monohydrate (SPIRIVA RESPIMAT) 1.25 MCG/ACT AERS, Inhale 2 puffs into the lungs daily., Disp: 4 each, Rfl: 0  Current Facility-Administered Medications:    0.9 %  sodium chloride infusion, 500 mL, Intravenous, Once, Mansouraty, Telford Nab., MD   polymixin-bacitracin (POLYSPORIN) ointment, , Topical, BID, Brimage, Vondra, DO No Known Allergies Family History  Problem Relation Age of Onset   Heart disease Father    Cancer Father        Prostate   Colon cancer Maternal Grandfather    Colon cancer Paternal Grandmother    Stomach cancer Neg Hx    Esophageal cancer Neg Hx    Social History   Socioeconomic History   Marital  status: Widowed    Spouse name: Not on file   Number of children: 5   Years of education: Not on file   Highest education level: Not on file  Occupational History   Occupation: disabled  Tobacco Use   Smoking status: Every Day    Packs/day: 1.00    Years: 34.00    Pack years: 34.00    Types: Cigarettes   Smokeless tobacco: Never  Vaping Use   Vaping Use: Never used  Substance and Sexual Activity   Alcohol use: No   Drug use: No   Sexual activity: Not Currently  Other Topics Concern   Not on file  Social History Narrative   Not on file   Social Determinants of Health   Financial Resource Strain: Not on file  Food Insecurity: Not on file  Transportation Needs: Not on file  Physical Activity: Not on file  Stress: Not on file  Social Connections: Not on file  Intimate Partner Violence: Not on file    Physical Exam: There were no vitals filed for this visit. There is no height or weight on file to calculate BMI. GEN: NAD EYE: Sclerae anicteric ENT: MMM CV: Non-tachycardic GI: Soft, NT/ND NEURO:  Alert & Oriented x 3  Lab Results: Recent Labs    08/10/21 1322  WBC 6.5  HGB 10.4*  HCT 34.3*  PLT 282.0   BMET No results for input(s): NA, K, CL, CO2, GLUCOSE, BUN, CREATININE, CALCIUM in the last 72 hours. LFT No results for input(s): PROT, ALBUMIN, AST, ALT, ALKPHOS, BILITOT, BILIDIR, IBILI in the last 72 hours. PT/INR No results for input(s): LABPROT, INR in the last 72 hours.   Impression / Plan: This is a 50 y.o.female who presents for EGD/colonoscopy for evaluation of rectal bleeding, abdominal pain, anal fissure, iron deficiency anemia.  The risks and benefits of endoscopic evaluation/treatment were discussed with the patient and/or family; these include but are not limited to the risk of perforation, infection, bleeding, missed lesions, lack of diagnosis, severe illness requiring hospitalization, as well as anesthesia and sedation related illnesses.  The  patient's history has been reviewed, patient examined, no change in status, and deemed stable for procedure.  The patient  and/or family is agreeable to proceed.    Justice Britain, MD [facility] Advanced [facility]oscopy Office # [phone]

## 2021-08-11 NOTE — Progress Notes (Signed)
[name] states no medical or surgical changes since previsit or office visit. 

## 2021-08-11 NOTE — Progress Notes (Signed)
Pt Drowsy. VSS. To PACU, report to RN. No anesthetic complications noted.  

## 2021-08-11 NOTE — Op Note (Signed)
[facility] Patient Name: Kimberly [name] Procedure Date: 08/11/2021 3:24 PM MRN: [id number] Endoscopist: Justice Britain , MD Age: 50 Referring MD:  Date of Birth: 1971-07-12 Gender: Female Account #: [id number] Procedure:                Colonoscopy Indications:              Surveillance: History of adenomatous polyps,                            inadequate prep on last exam (<24yr, Incidental -                            Lower abdominal pain, Incidental - Rectal bleeding,                            Incidental - Iron deficiency anemia Medicines:                Monitored Anesthesia Care Procedure:                Pre-Anesthesia Assessment:                           - Prior to the procedure, a History and Physical                            was performed, and patient medications and                            allergies were reviewed. The patient's tolerance of                            previous anesthesia was also reviewed. The risks                            and benefits of the procedure and the sedation                            options and risks were discussed with the patient.                            All questions were answered, and informed consent                            was obtained. Prior Anticoagulants: The patient has                            taken no previous anticoagulant or antiplatelet                            agents. ASA Grade Assessment: III - A patient with                            severe systemic disease. After reviewing the risks  and benefits, the patient was deemed in                            satisfactory condition to undergo the procedure.                           After obtaining informed consent, the colonoscope                            was passed under direct vision. Throughout the                            procedure, the patient's blood pressure, pulse, and                            oxygen  saturations were monitored continuously. The                            CF [id number] #[id number] was introduced through the anus                            and advanced to the the cecum, identified by                            appendiceal orifice and ileocecal valve. The                            colonoscopy was somewhat difficult due to a                            redundant colon. Successful completion of the                            procedure was aided by changing the patient's                            position, using manual pressure, straightening and                            shortening the scope to obtain bowel loop reduction                            and using scope torsion. The patient tolerated the                            procedure. The quality of the bowel preparation was                            adequate. The ileocecal valve, appendiceal orifice,                            and rectum were photographed. Scope In: 3:43:33 PM Scope Out: 3:58:44 PM Scope Withdrawal Time: 0 hours 10 minutes 17 seconds  Total Procedure  Duration: 0 hours 15 minutes 11 seconds  Findings:                 The digital rectal exam findings include                            hemorrhoids. Pertinent negatives include no                            palpable rectal lesions.                           The colon (entire examined portion) was                            significantly redundant requiring looping to reach                            the cecum.                           Normal mucosa was found in the entire colon.                           Non-bleeding non-thrombosed external and internal                            hemorrhoids were found during retroflexion, during                            perianal exam and during digital exam. The                            hemorrhoids were Grade III (internal hemorrhoids                            that prolapse but require manual reduction). Complications:             No immediate complications. Estimated Blood Loss:     Estimated blood loss: none. Impression:               - Hemorrhoids found on digital rectal exam.                           - Redundant colon requiring looping to reach the                            cecum.                           - Normal mucosa in the entire examined colon.                           - Non-bleeding non-thrombosed external and internal                            hemorrhoids. Recommendation:           -  The patient will be observed post-procedure,                            until all discharge criteria are met.                           - Discharge patient to home.                           - Patient has a contact number available for                            emergencies. The signs and symptoms of potential                            delayed complications were discussed with the                            patient. Return to normal activities tomorrow.                            Written discharge instructions were provided to the                            patient.                           - High fiber diet.                           - Use FiberCon 1-2 tablets PO daily.                           - Continue present medications.                           - Anusol suppositories every other night for                            2-weeks until prescription is complete.                           - Will discuss with primary GI Dr. Loletha Carrow                            consideration of hemorrhoidal banding vs referral                            to [facility] for hemorrhoidal banding + hemorrhoidectomy.                           - Repeat colonoscopy in 7 years for surveillance                            due to prior history of adenomatous colon polyps.                           -  Pending how the patient does with Iron                            supplementation, she could require VCE to further                            evaluate  her IDA.                           - The findings and recommendations were discussed                            with the patient.                           - The findings and recommendations were discussed                            with the patient's family. Justice Britain, MD 08/11/2021 4:14:42 PM

## 2021-08-15 ENCOUNTER — Ambulatory Visit: Payer: Medicare Other

## 2021-08-15 ENCOUNTER — Telehealth: Payer: Self-pay | Admitting: *Deleted

## 2021-08-15 ENCOUNTER — Other Ambulatory Visit: Payer: Self-pay | Admitting: *Deleted

## 2021-08-15 ENCOUNTER — Inpatient Hospital Stay: Payer: Medicare Other | Admitting: Pharmacist

## 2021-08-15 ENCOUNTER — Inpatient Hospital Stay: Payer: Medicare Other

## 2021-08-15 ENCOUNTER — Other Ambulatory Visit: Payer: Self-pay

## 2021-08-15 ENCOUNTER — Encounter: Payer: Self-pay | Admitting: Adult Health

## 2021-08-15 ENCOUNTER — Other Ambulatory Visit: Payer: Medicare Other

## 2021-08-15 ENCOUNTER — Other Ambulatory Visit: Payer: Self-pay | Admitting: Oncology

## 2021-08-15 VITALS — BP 141/87 | HR 90 | Temp 98.6°F

## 2021-08-15 VITALS — BP 129/73 | HR 84 | Temp 97.5°F | Resp 18 | Ht 68.0 in | Wt 291.5 lb

## 2021-08-15 DIAGNOSIS — Z95828 Presence of other vascular implants and grafts: Secondary | ICD-10-CM

## 2021-08-15 DIAGNOSIS — C50412 Malignant neoplasm of upper-outer quadrant of left female breast: Secondary | ICD-10-CM

## 2021-08-15 DIAGNOSIS — D509 Iron deficiency anemia, unspecified: Secondary | ICD-10-CM | POA: Diagnosis not present

## 2021-08-15 DIAGNOSIS — Z170 Estrogen receptor positive status [ER+]: Secondary | ICD-10-CM

## 2021-08-15 DIAGNOSIS — Z5111 Encounter for antineoplastic chemotherapy: Secondary | ICD-10-CM | POA: Diagnosis not present

## 2021-08-15 LAB — CMP (CANCER CENTER ONLY)
ALT: 6 U/L (ref 0–44)
AST: 11 U/L — ABNORMAL LOW (ref 15–41)
Albumin: 4 g/dL (ref 3.5–5.0)
Alkaline Phosphatase: 62 U/L (ref 38–126)
Anion gap: 9 (ref 5–15)
BUN: 8 mg/dL (ref 6–20)
CO2: 24 mmol/L (ref 22–32)
Calcium: 9.1 mg/dL (ref 8.9–10.3)
Chloride: 106 mmol/L (ref 98–111)
Creatinine: 0.95 mg/dL (ref 0.44–1.00)
GFR, Estimated: >60 mL/min (ref >60)
Glucose, Bld: 77 mg/dL (ref 70–99)
Potassium: 3.9 mmol/L (ref 3.5–5.1)
Sodium: 139 mmol/L (ref 135–145)
Total Bilirubin: 0.4 mg/dL (ref 0.3–1.2)
Total Protein: 7.4 g/dL (ref 6.5–8.1)

## 2021-08-15 LAB — CBC WITH DIFFERENTIAL (CANCER CENTER ONLY)
Abs Immature Granulocytes: 0.02 10*3/uL (ref 0.00–0.07)
Basophils Absolute: 0.1 10*3/uL (ref 0.0–0.1)
Basophils Relative: 1 %
Eosinophils Absolute: 0 10*3/uL (ref 0.0–0.5)
Eosinophils Relative: 0 %
HCT: 33.9 % — ABNORMAL LOW (ref 36.0–46.0)
Hemoglobin: 10.1 g/dL — ABNORMAL LOW (ref 12.0–15.0)
Immature Granulocytes: 0 %
Lymphocytes Relative: 39 %
Lymphs Abs: 2 10*3/uL (ref 0.7–4.0)
MCH: 22.9 pg — ABNORMAL LOW (ref 26.0–34.0)
MCHC: 29.8 g/dL — ABNORMAL LOW (ref 30.0–36.0)
MCV: 76.7 fL — ABNORMAL LOW (ref 80.0–100.0)
Monocytes Absolute: 0.5 10*3/uL (ref 0.1–1.0)
Monocytes Relative: 9 %
Neutro Abs: 2.6 10*3/uL (ref 1.7–7.7)
Neutrophils Relative %: 51 %
Platelet Count: 235 10*3/uL (ref 150–400)
RBC: 4.42 MIL/uL (ref 3.87–5.11)
RDW: 24.5 % — ABNORMAL HIGH (ref 11.5–15.5)
WBC Count: 5.2 10*3/uL (ref 4.0–10.5)
nRBC: 0 % (ref 0.0–0.2)

## 2021-08-15 MED ORDER — SODIUM CHLORIDE 0.9% FLUSH
10.0000 mL | Freq: Once | INTRAVENOUS | Status: AC | PRN
  Administered 2021-08-15: 10 mL

## 2021-08-15 MED ORDER — HEPARIN SOD (PORK) LOCK FLUSH 100 UNIT/ML IV SOLN
500.0000 [IU] | Freq: Once | INTRAVENOUS | Status: AC | PRN
  Administered 2021-08-15: 500 [IU]

## 2021-08-15 MED ORDER — ONDANSETRON HCL 8 MG PO TABS: 8.0000 mg | ORAL_TABLET | Freq: Three times a day (TID) | ORAL | 0 refills | Status: DC | PRN

## 2021-08-15 MED ORDER — SODIUM CHLORIDE 0.9 % IV SOLN
200.0000 mg | Freq: Once | INTRAVENOUS | Status: AC
  Administered 2021-08-15: 200 mg via INTRAVENOUS
  Filled 2021-08-15: qty 200

## 2021-08-15 MED ORDER — PROCHLORPERAZINE MALEATE 5 MG PO TABS: 5.0000 mg | ORAL_TABLET | Freq: Four times a day (QID) | ORAL | 0 refills | Status: DC | PRN

## 2021-08-15 MED ORDER — FULVESTRANT 250 MG/5ML IM SOSY
500.0000 mg | PREFILLED_SYRINGE | Freq: Once | INTRAMUSCULAR | Status: AC
  Administered 2021-08-15: 500 mg via INTRAMUSCULAR
  Filled 2021-08-15: qty 10

## 2021-08-15 MED ORDER — SODIUM CHLORIDE 0.9 % IV SOLN: Freq: Once | INTRAVENOUS | Status: AC

## 2021-08-15 MED ORDER — SODIUM CHLORIDE 0.9% FLUSH
10.0000 mL | Freq: Once | INTRAVENOUS | Status: AC
  Administered 2021-08-15: 10 mL via INTRAVENOUS

## 2021-08-15 NOTE — Telephone Encounter (Signed)
  Follow up Call-  Call back number 08/11/2021 04/02/2019  Post procedure Call Back phone  # [phone]  Permission to leave phone message Yes Yes  Some recent data might be hidden     Patient questions:  Do you have a fever, pain , or abdominal swelling? No. Pain Score  0 *  Have you tolerated food without any problems? Yes.    Have you been able to return to your normal activities? Yes.    Do you have any questions about your discharge instructions: Diet   No. Medications  No. Follow up visit  No.  Do you have questions or concerns about your Care? No.  Actions: * If pain score is 4 or above: No action needed, pain <4.  Have you developed a fever since your procedure? no  2.   Have you had an respiratory symptoms (SOB or cough) since your procedure? no  3.   Have you tested positive for COVID 19 since your procedure no  4.   Have you had any family members/close contacts diagnosed with the COVID 19 since your procedure?  no   If yes to any of these questions please route to Joylene John, RN and Joella Prince, RN

## 2021-08-15 NOTE — Progress Notes (Signed)
Patient refused to wait post 30  minutes after iron infusion, patient stated" I feel good every time and I aint waiting for nothing." Patient discharged in stable condition. LTT RN
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
## 2021-08-15 NOTE — Patient Instructions (Signed)

## 2021-08-17 ENCOUNTER — Encounter: Payer: Self-pay | Admitting: Gastroenterology

## 2021-08-17 ENCOUNTER — Other Ambulatory Visit: Payer: Self-pay | Admitting: General Surgery
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
## 2021-08-22 HISTORY — PX: MASTECTOMY: SHX3

## 2021-08-23 DIAGNOSIS — C50912 Malignant neoplasm of unspecified site of left female breast: Secondary | ICD-10-CM | POA: Diagnosis not present

## 2021-08-23 NOTE — Pre-Procedure Instructions (Signed)
Surgical Instructions    Your procedure is scheduled on Monday 08/28/21.   Report to [facility] Main Entrance "A" at 08:30 A.M., then check in with the Admitting office.  Call this number if you have problems the morning of surgery:  [phone]   If you have any questions prior to your surgery date call [phone]: Open Monday-Friday 8am-4pm    Remember:  Do not eat after midnight the night before your surgery  You may drink clear liquids until 07:30 A.M. the morning of your surgery.   Clear liquids allowed are: Water, Non-Citrus Juices (without pulp), Carbonated Beverages, Clear Tea, Black Coffee ONLY (NO MILK, CREAM OR POWDERED CREAMER of any kind), and Gatorade  Patient Instructions  The night before surgery:  No food after midnight. ONLY clear liquids after midnight  The day of surgery (if you do NOT have diabetes):  Drink ONE (1) Pre-Surgery Clear Ensure by 07:30 A.M. the morning of surgery. Drink in one sitting. Do not sip.  This drink was given to you during your hospital  pre-op appointment visit.  Nothing else to drink after completing the  Pre-Surgery Clear Ensure.         If you have questions, please contact your surgeon's office.     Take these medicines the morning of surgery with A SIP OF WATER   buPROPion (WELLBUTRIN XL)   busPIRone (BUSPAR)  pantoprazole (PROTONIX)   Take these medicines if needed:  albuterol (VENTOLIN HFA) 108 (90 Base)  ondansetron (ZOFRAN)   prochlorperazine (COMPAZINE)   Tiotropium Bromide Monohydrate (SPIRIVA RESPIMAT)   As of today, STOP taking any Aspirin (unless otherwise instructed by your surgeon) Aleve, Naproxen, Ibuprofen, Motrin, Advil, Goody's, BC's, all herbal medications, fish oil, and all vitamins.     After your COVID test   You are not required to quarantine however you are required to wear a well-fitting mask when you are out and around people not in your household.  If your mask becomes wet or soiled,  replace with a new one.  Wash your hands often with soap and water for 20 seconds or clean your hands with an alcohol-based hand sanitizer that contains at least 60% alcohol.  Do not share personal items.  Notify your provider: if you are in close contact with someone who has COVID  or if you develop a fever of 100.4 or greater, sneezing, cough, sore throat, shortness of breath or body aches.             Do not wear jewelry or makeup Do not wear lotions, powders, perfumes/colognes, or deodorant. Do not shave 48 hours prior to surgery.  Men may shave face and neck. Do not bring valuables to the hospital. DO Not wear nail polish, gel polish, artificial nails, or any other type of covering on natural nails including finger and toenails. If patients have artificial nails, gel coating, etc. that need to be removed by a nail salon, please have this removed prior to surgery or surgery may need to be canceled/delayed if the surgeon/ anesthesia feels like the patient is unable to be adequately monitored.             [facility] is not responsible for any belongings or valuables.  Do NOT Smoke (Tobacco/Vaping)  24 hours prior to your procedure  If you use a CPAP at night, you may bring your mask for your overnight stay.   Contacts, glasses, hearing aids, dentures or partials may not be worn into surgery, please  bring cases for these belongings   For patients admitted to the hospital, discharge time will be determined by your treatment team.   Patients discharged the day of surgery will not be allowed to drive home, and someone needs to stay with them for 24 hours.  NO VISITORS WILL BE ALLOWED IN PRE-OP WHERE PATIENTS ARE PREPPED FOR SURGERY.  ONLY 1 SUPPORT PERSON MAY BE PRESENT IN THE WAITING ROOM WHILE YOU ARE IN SURGERY.  IF YOU ARE TO BE ADMITTED, ONCE YOU ARE IN YOUR ROOM YOU WILL BE ALLOWED TWO (2) VISITORS. 1 (ONE) VISITOR MAY STAY OVERNIGHT BUT MUST ARRIVE TO THE ROOM BY 8pm.  Minor  children may have two parents present. Special consideration for safety and communication needs will be reviewed on a case by case basis.  Special instructions:    Oral Hygiene is also important to reduce your risk of infection.  Remember - BRUSH YOUR TEETH THE MORNING OF SURGERY WITH YOUR REGULAR TOOTHPASTE   [facility]- Preparing For Surgery  Before surgery, you can play an important role. Because skin is not sterile, your skin needs to be as free of germs as possible. You can reduce the number of germs on your skin by washing with CHG (chlorahexidine gluconate) Soap before surgery.  CHG is an antiseptic cleaner which kills germs and bonds with the skin to continue killing germs even after washing.     Please do not use if you have an allergy to CHG or antibacterial soaps. If your skin becomes reddened/irritated stop using the CHG.  Do not shave (including legs and underarms) for at least 48 hours prior to first CHG shower. It is OK to shave your face.  Please follow these instructions carefully.     Shower the NIGHT BEFORE SURGERY and the MORNING OF SURGERY with CHG Soap.   If you chose to wash your hair, wash your hair first as usual with your normal shampoo. After you shampoo, rinse your hair and body thoroughly to remove the shampoo.  Then [employer] and genitals (private parts) with your normal soap and rinse thoroughly to remove soap.  After that Use CHG Soap as you would any other liquid soap. You can apply CHG directly to the skin and wash gently with a scrungie or a clean washcloth.   Apply the CHG Soap to your body ONLY FROM THE NECK DOWN.  Do not use on open wounds or open sores. Avoid contact with your eyes, ears, mouth and genitals (private parts). Wash Face and genitals (private parts)  with your normal soap.   Wash thoroughly, paying special attention to the area where your surgery will be performed.  Thoroughly rinse your body with warm water from the neck down.  DO NOT  shower/wash with your normal soap after using and rinsing off the CHG Soap.  Pat yourself dry with a CLEAN TOWEL.  Wear CLEAN PAJAMAS to bed the night before surgery  Place CLEAN SHEETS on your bed the night before your surgery  DO NOT SLEEP WITH PETS.   Day of Surgery:  Take a shower with CHG soap. Wear Clean/Comfortable clothing the morning of surgery Do not apply any deodorants/lotions.   Remember to brush your teeth WITH YOUR REGULAR TOOTHPASTE.   Please read over the following fact sheets that you were given.

## 2021-08-24 ENCOUNTER — Encounter (HOSPITAL_COMMUNITY): Payer: Self-pay

## 2021-08-24 ENCOUNTER — Other Ambulatory Visit: Payer: Self-pay

## 2021-08-24 ENCOUNTER — Encounter (HOSPITAL_COMMUNITY)
Admission: RE | Admit: 2021-08-24 | Discharge: 2021-08-24 | Disposition: A | Discharge status: Home / Self Care | Payer: Medicare Other | Source: Ambulatory Visit | Attending: General Surgery | Admitting: General Surgery

## 2021-08-24 VITALS — BP 136/92 | HR 94 | Temp 98.0°F | Resp 19 | Ht 67.0 in | Wt 291.7 lb

## 2021-08-24 DIAGNOSIS — Z01818 Encounter for other preprocedural examination: Secondary | ICD-10-CM

## 2021-08-24 DIAGNOSIS — Z20822 Contact with and (suspected) exposure to covid-19: Secondary | ICD-10-CM | POA: Insufficient documentation

## 2021-08-24 DIAGNOSIS — Z01812 Encounter for preprocedural laboratory examination: Secondary | ICD-10-CM | POA: Insufficient documentation

## 2021-08-24 HISTORY — DX: Depression, unspecified: F32.A

## 2021-08-24 HISTORY — DX: Anxiety disorder, unspecified: F41.9

## 2021-08-24 LAB — SARS CORONAVIRUS 2 (TAT 6-24 HRS): SARS Coronavirus 2: NEGATIVE

## 2021-08-24 NOTE — Progress Notes (Signed)
PCP - Dr. Lurline Del Cardiologist - pt denies  PPM/ICD - n/a  Chest x-ray - 01/26/21 EKG - 01/04/21 Stress Test - pt denies ECHO - 03/08/17 Cardiac Cath - pt denies  Sleep Study - pt denies CPAP - n/a  Blood Thinner Instructions: n/a Aspirin Instructions: As of today, STOP taking any Aspirin (unless otherwise instructed by your surgeon) Aleve, Naproxen, Ibuprofen, Motrin, Advil, Goody's, BC's, all herbal medications, fish oil, and all vitamins.  ERAS Protcol - yes PRE-SURGERY Ensure or G2- ensure  COVID TEST- 08/24/21 in PAT; You are not required to quarantine however you are required to wear a well-fitting mask when you are out and around people not in your household.  If your mask becomes wet or soiled, replace with a new one.  Anesthesia review: yes, current chemotherapy--fulvestrant injections  Patient denies shortness of breath, fever, cough and chest pain at PAT appointment   All instructions explained to the patient, with a verbal understanding of the material. Patient agrees to go over the instructions while at home for a better understanding. The opportunity to ask questions was provided.

## 2021-08-24 NOTE — Pre-Procedure Instructions (Addendum)
Surgical Instructions    Your procedure is scheduled on Monday 08/28/21.   Report to [facility] Main Entrance "A" at 08:30 A.M., then check in with the Admitting office.  Call this number if you have problems the morning of surgery:  [phone]   If you have any questions prior to your surgery date call [phone]: Open Monday-Friday 8am-4pm    Remember:  Do not eat after midnight the night before your surgery  You may drink clear liquids until 07:30 A.M. the morning of your surgery.   Clear liquids allowed are: Water, Non-Citrus Juices (without pulp), Carbonated Beverages, Clear Tea, Black Coffee ONLY (NO MILK, CREAM OR POWDERED CREAMER of any kind), and Gatorade  Patient Instructions  The night before surgery:  No food after midnight. ONLY clear liquids after midnight  The day of surgery (if you do NOT have diabetes):  Drink ONE (1) Pre-Surgery Clear Ensure by 07:30 A.M. the morning of surgery. Drink in one sitting. Do not sip.  This drink was given to you during your hospital  pre-op appointment visit.  Nothing else to drink after completing the  Pre-Surgery Clear Ensure.         If you have questions, please contact your surgeon's office.     Take these medicines the morning of surgery with A SIP OF WATER   abemaciclib (VERZENIO)  buPROPion (WELLBUTRIN XL)   pantoprazole (PROTONIX) rosuvastatin (CRESTOR)    Take these medicines if needed:  albuterol (VENTOLIN HFA) 108 (90 Base)  ondansetron (ZOFRAN)   prochlorperazine (COMPAZINE)   Tiotropium Bromide Monohydrate (SPIRIVA RESPIMAT)      As of today, STOP taking any Aspirin (unless otherwise instructed by your surgeon) Aleve, Naproxen, Ibuprofen, Motrin, Advil, Goody's, BC's, all herbal medications, fish oil, and all vitamins.    After your COVID test   You are not required to quarantine however you are required to wear a well-fitting mask when you are out and around people not in your household.  If your  mask becomes wet or soiled, replace with a new one.  Wash your hands often with soap and water for 20 seconds or clean your hands with an alcohol-based hand sanitizer that contains at least 60% alcohol.  Do not share personal items.  Notify your provider: if you are in close contact with someone who has COVID  or if you develop a fever of 100.4 or greater, sneezing, cough, sore throat, shortness of breath or body aches.             Do not wear jewelry or makeup Do not wear lotions, powders, perfumes/colognes, or deodorant. Do not shave 48 hours prior to surgery.  Men may shave face and neck. Do not bring valuables to the hospital. DO Not wear nail polish, gel polish, artificial nails, or any other type of covering on natural nails including finger and toenails. If patients have artificial nails, gel coating, etc. that need to be removed by a nail salon, please have this removed prior to surgery or surgery may need to be canceled/delayed if the surgeon/ anesthesia feels like the patient is unable to be adequately monitored.             [facility] is not responsible for any belongings or valuables.  Do NOT Smoke (Tobacco/Vaping)  24 hours prior to your procedure  If you use a CPAP at night, you may bring your mask for your overnight stay.   Contacts, glasses, hearing aids, dentures or partials may not  be worn into surgery, please bring cases for these belongings   For patients admitted to the hospital, discharge time will be determined by your treatment team.   Patients discharged the day of surgery will not be allowed to drive home, and someone needs to stay with them for 24 hours.  NO VISITORS WILL BE ALLOWED IN PRE-OP WHERE PATIENTS ARE PREPPED FOR SURGERY.  ONLY 1 SUPPORT PERSON MAY BE PRESENT IN THE WAITING ROOM WHILE YOU ARE IN SURGERY.  IF YOU ARE TO BE ADMITTED, ONCE YOU ARE IN YOUR ROOM YOU WILL BE ALLOWED TWO (2) VISITORS. 1 (ONE) VISITOR MAY STAY OVERNIGHT BUT MUST ARRIVE TO  THE ROOM BY 8pm.  Minor children may have two parents present. Special consideration for safety and communication needs will be reviewed on a case by case basis.  Special instructions:    Oral Hygiene is also important to reduce your risk of infection.  Remember - BRUSH YOUR TEETH THE MORNING OF SURGERY WITH YOUR REGULAR TOOTHPASTE   [facility]- Preparing For Surgery  Before surgery, you can play an important role. Because skin is not sterile, your skin needs to be as free of germs as possible. You can reduce the number of germs on your skin by washing with CHG (chlorahexidine gluconate) Soap before surgery.  CHG is an antiseptic cleaner which kills germs and bonds with the skin to continue killing germs even after washing.     Please do not use if you have an allergy to CHG or antibacterial soaps. If your skin becomes reddened/irritated stop using the CHG.  Do not shave (including legs and underarms) for at least 48 hours prior to first CHG shower. It is OK to shave your face.  Please follow these instructions carefully.     Shower the NIGHT BEFORE SURGERY and the MORNING OF SURGERY with CHG Soap.   If you chose to wash your hair, wash your hair first as usual with your normal shampoo. After you shampoo, rinse your hair and body thoroughly to remove the shampoo.  Then [employer] and genitals (private parts) with your normal soap and rinse thoroughly to remove soap.  After that Use CHG Soap as you would any other liquid soap. You can apply CHG directly to the skin and wash gently with a scrungie or a clean washcloth.   Apply the CHG Soap to your body ONLY FROM THE NECK DOWN.  Do not use on open wounds or open sores. Avoid contact with your eyes, ears, mouth and genitals (private parts). Wash Face and genitals (private parts)  with your normal soap.   Wash thoroughly, paying special attention to the area where your surgery will be performed.  Thoroughly rinse your body with warm water from  the neck down.  DO NOT shower/wash with your normal soap after using and rinsing off the CHG Soap.  Pat yourself dry with a CLEAN TOWEL.  Wear CLEAN PAJAMAS to bed the night before surgery  Place CLEAN SHEETS on your bed the night before your surgery  DO NOT SLEEP WITH PETS.   Day of Surgery:  Take a shower with CHG soap. Wear Clean/Comfortable clothing the morning of surgery Do not apply any deodorants/lotions.   Remember to brush your teeth WITH YOUR REGULAR TOOTHPASTE.   Please read over the following fact sheets that you were given.

## 2021-08-25 ENCOUNTER — Other Ambulatory Visit: Payer: Self-pay | Admitting: Family Medicine

## 2021-08-28 ENCOUNTER — Ambulatory Visit (HOSPITAL_COMMUNITY): Payer: Medicare Other | Admitting: Physician Assistant

## 2021-08-28 ENCOUNTER — Encounter (HOSPITAL_COMMUNITY): Payer: Self-pay | Admitting: General Surgery

## 2021-08-28 ENCOUNTER — Inpatient Hospital Stay (HOSPITAL_COMMUNITY)
Admission: RE | Admit: 2021-08-28 | Discharge: 2021-08-31 | DRG: 582 | Disposition: A | Discharge status: Home / Self Care | Payer: Medicare Other | Attending: General Surgery | Admitting: General Surgery

## 2021-08-28 ENCOUNTER — Other Ambulatory Visit: Payer: Self-pay

## 2021-08-28 ENCOUNTER — Other Ambulatory Visit: Payer: Self-pay | Admitting: *Deleted

## 2021-08-28 ENCOUNTER — Encounter (HOSPITAL_COMMUNITY)
Admission: RE | Disposition: A | Discharge status: Home / Self Care | Payer: Self-pay | Source: Home / Self Care | Attending: General Surgery

## 2021-08-28 ENCOUNTER — Telehealth: Payer: Self-pay

## 2021-08-28 DIAGNOSIS — Z79899 Other long term (current) drug therapy: Secondary | ICD-10-CM | POA: Diagnosis not present

## 2021-08-28 DIAGNOSIS — C50412 Malignant neoplasm of upper-outer quadrant of left female breast: Secondary | ICD-10-CM | POA: Diagnosis present

## 2021-08-28 DIAGNOSIS — Z6841 Body Mass Index (BMI) 40.0 and over, adult: Secondary | ICD-10-CM | POA: Diagnosis not present

## 2021-08-28 DIAGNOSIS — Z8249 Family history of ischemic heart disease and other diseases of the circulatory system: Secondary | ICD-10-CM

## 2021-08-28 DIAGNOSIS — J449 Chronic obstructive pulmonary disease, unspecified: Secondary | ICD-10-CM | POA: Diagnosis present

## 2021-08-28 DIAGNOSIS — C773 Secondary and unspecified malignant neoplasm of axilla and upper limb lymph nodes: Secondary | ICD-10-CM | POA: Diagnosis present

## 2021-08-28 DIAGNOSIS — E119 Type 2 diabetes mellitus without complications: Secondary | ICD-10-CM | POA: Diagnosis present

## 2021-08-28 DIAGNOSIS — J441 Chronic obstructive pulmonary disease with (acute) exacerbation: Secondary | ICD-10-CM | POA: Diagnosis not present

## 2021-08-28 DIAGNOSIS — Z170 Estrogen receptor positive status [ER+]: Secondary | ICD-10-CM

## 2021-08-28 DIAGNOSIS — F319 Bipolar disorder, unspecified: Secondary | ICD-10-CM | POA: Diagnosis present

## 2021-08-28 DIAGNOSIS — G8918 Other acute postprocedural pain: Secondary | ICD-10-CM | POA: Diagnosis not present

## 2021-08-28 DIAGNOSIS — Z9012 Acquired absence of left breast and nipple: Secondary | ICD-10-CM

## 2021-08-28 DIAGNOSIS — C792 Secondary malignant neoplasm of skin: Secondary | ICD-10-CM | POA: Diagnosis present

## 2021-08-28 DIAGNOSIS — Z803 Family history of malignant neoplasm of breast: Secondary | ICD-10-CM | POA: Diagnosis not present

## 2021-08-28 DIAGNOSIS — C50912 Malignant neoplasm of unspecified site of left female breast: Secondary | ICD-10-CM | POA: Diagnosis not present

## 2021-08-28 DIAGNOSIS — Z923 Personal history of irradiation: Secondary | ICD-10-CM | POA: Diagnosis not present

## 2021-08-28 HISTORY — PX: TOTAL MASTECTOMY: SHX6129

## 2021-08-28 LAB — GLUCOSE, CAPILLARY: Glucose-Capillary: 119 mg/dL — ABNORMAL HIGH (ref 70–99)

## 2021-08-28 SURGERY — MASTECTOMY, SIMPLE
Anesthesia: Regional | Site: Breast | Laterality: Left

## 2021-08-28 MED ORDER — OXYCODONE HCL 5 MG PO TABS
ORAL_TABLET | ORAL | Status: AC
  Administered 2021-08-28: 5 mg via ORAL
  Filled 2021-08-28: qty 1

## 2021-08-28 MED ORDER — FENTANYL CITRATE (PF) 250 MCG/5ML IJ SOLN
INTRAMUSCULAR | Status: DC | PRN
  Administered 2021-08-28 (×3): 50 ug via INTRAVENOUS
  Administered 2021-08-28: 100 ug via INTRAVENOUS

## 2021-08-28 MED ORDER — QUETIAPINE FUMARATE 400 MG PO TABS
400.0000 mg | ORAL_TABLET | Freq: Every day | ORAL | Status: DC
  Administered 2021-08-28 – 2021-08-30 (×3): 400 mg via ORAL
  Filled 2021-08-28 (×4): qty 1

## 2021-08-28 MED ORDER — CEFAZOLIN IN SODIUM CHLORIDE 3-0.9 GM/100ML-% IV SOLN
3.0000 g | INTRAVENOUS | Status: AC
  Administered 2021-08-28: 3 g via INTRAVENOUS
  Filled 2021-08-28: qty 100

## 2021-08-28 MED ORDER — FENTANYL CITRATE (PF) 100 MCG/2ML IJ SOLN
INTRAMUSCULAR | Status: AC
  Administered 2021-08-28: 100 ug via INTRAVENOUS
  Filled 2021-08-28: qty 2

## 2021-08-28 MED ORDER — FENTANYL CITRATE (PF) 100 MCG/2ML IJ SOLN: 100.0000 ug | Freq: Once | INTRAMUSCULAR | Status: AC

## 2021-08-28 MED ORDER — OXYCODONE HCL 5 MG/5ML PO SOLN: 5.0000 mg | Freq: Once | ORAL | Status: AC | PRN

## 2021-08-28 MED ORDER — METHOCARBAMOL 500 MG PO TABS
500.0000 mg | ORAL_TABLET | Freq: Four times a day (QID) | ORAL | Status: DC | PRN
  Filled 2021-08-28 (×2): qty 1

## 2021-08-28 MED ORDER — DEXAMETHASONE SODIUM PHOSPHATE 10 MG/ML IJ SOLN
INTRAMUSCULAR | Status: DC | PRN
  Administered 2021-08-28: 5 mg via INTRAVENOUS

## 2021-08-28 MED ORDER — OXYCODONE HCL 5 MG PO TABS
5.0000 mg | ORAL_TABLET | ORAL | Status: DC | PRN
  Administered 2021-08-28 – 2021-08-29 (×4): 10 mg via ORAL
  Administered 2021-08-30: 5 mg via ORAL
  Administered 2021-08-30 – 2021-08-31 (×6): 10 mg via ORAL
  Filled 2021-08-28 (×12): qty 2

## 2021-08-28 MED ORDER — HYDROMORPHONE HCL 1 MG/ML IJ SOLN
INTRAMUSCULAR | Status: DC | PRN
  Administered 2021-08-28 (×2): .25 mg via INTRAVENOUS

## 2021-08-28 MED ORDER — CHLORHEXIDINE GLUCONATE 0.12 % MT SOLN
15.0000 mL | Freq: Once | OROMUCOSAL | Status: AC
  Administered 2021-08-28: 15 mL via OROMUCOSAL
  Filled 2021-08-28: qty 15

## 2021-08-28 MED ORDER — HYDROMORPHONE HCL 1 MG/ML IJ SOLN
INTRAMUSCULAR | Status: AC
  Filled 2021-08-28: qty 0.5

## 2021-08-28 MED ORDER — PANTOPRAZOLE SODIUM 40 MG PO TBEC
40.0000 mg | DELAYED_RELEASE_TABLET | Freq: Every day | ORAL | Status: DC
  Administered 2021-08-28 – 2021-08-31 (×4): 40 mg via ORAL
  Filled 2021-08-28 (×4): qty 1

## 2021-08-28 MED ORDER — HYDROMORPHONE HCL 1 MG/ML IJ SOLN
0.5000 mg | INTRAMUSCULAR | Status: DC | PRN
  Administered 2021-08-28 (×2): 0.5 mg via INTRAVENOUS

## 2021-08-28 MED ORDER — PHENYLEPHRINE 40 MCG/ML (10ML) SYRINGE FOR IV PUSH (FOR BLOOD PRESSURE SUPPORT)
PREFILLED_SYRINGE | INTRAVENOUS | Status: DC | PRN
Start: 2021-08-28 — End: 2021-08-28
  Administered 2021-08-28 (×2): 80 ug via INTRAVENOUS

## 2021-08-28 MED ORDER — HYDROMORPHONE HCL 1 MG/ML IJ SOLN
0.2500 mg | INTRAMUSCULAR | Status: DC | PRN
  Administered 2021-08-28: 0.5 mg via INTRAVENOUS

## 2021-08-28 MED ORDER — ENSURE PRE-SURGERY PO LIQD: 296.0000 mL | Freq: Once | ORAL | Status: DC

## 2021-08-28 MED ORDER — MIDAZOLAM HCL 2 MG/2ML IJ SOLN
INTRAMUSCULAR | Status: AC
  Administered 2021-08-28: 2 mg via INTRAVENOUS
  Filled 2021-08-28: qty 2

## 2021-08-28 MED ORDER — SODIUM CHLORIDE 0.9 % IV SOLN: INTRAVENOUS | Status: DC

## 2021-08-28 MED ORDER — ORAL CARE MOUTH RINSE: 15.0000 mL | Freq: Once | OROMUCOSAL | Status: AC

## 2021-08-28 MED ORDER — ONDANSETRON HCL 4 MG/2ML IJ SOLN
INTRAMUSCULAR | Status: DC | PRN
  Administered 2021-08-28: 4 mg via INTRAVENOUS

## 2021-08-28 MED ORDER — PROMETHAZINE HCL 25 MG/ML IJ SOLN: 6.2500 mg | INTRAMUSCULAR | Status: DC | PRN

## 2021-08-28 MED ORDER — MIDAZOLAM HCL 2 MG/2ML IJ SOLN
INTRAMUSCULAR | Status: AC
  Filled 2021-08-28: qty 2

## 2021-08-28 MED ORDER — ROSUVASTATIN CALCIUM 5 MG PO TABS
10.0000 mg | ORAL_TABLET | Freq: Every day | ORAL | Status: DC
  Administered 2021-08-29 – 2021-08-31 (×3): 10 mg via ORAL
  Filled 2021-08-28 (×3): qty 2

## 2021-08-28 MED ORDER — DEXMEDETOMIDINE (PRECEDEX) IN NS 20 MCG/5ML (4 MCG/ML) IV SYRINGE
PREFILLED_SYRINGE | INTRAVENOUS | Status: DC | PRN
  Administered 2021-08-28: 8 ug via INTRAVENOUS
  Administered 2021-08-28: 4 ug via INTRAVENOUS
  Administered 2021-08-28 (×2): 8 ug via INTRAVENOUS

## 2021-08-28 MED ORDER — ONDANSETRON HCL 4 MG/2ML IJ SOLN: 4.0000 mg | Freq: Four times a day (QID) | INTRAMUSCULAR | Status: DC | PRN

## 2021-08-28 MED ORDER — SIMETHICONE 80 MG PO CHEW: 40.0000 mg | CHEWABLE_TABLET | Freq: Four times a day (QID) | ORAL | Status: DC | PRN

## 2021-08-28 MED ORDER — BUPROPION HCL ER (XL) 150 MG PO TB24
150.0000 mg | ORAL_TABLET | Freq: Every day | ORAL | Status: DC
  Administered 2021-08-28 – 2021-08-31 (×4): 150 mg via ORAL
  Filled 2021-08-28 (×4): qty 1

## 2021-08-28 MED ORDER — DOXEPIN HCL 50 MG PO CAPS
50.0000 mg | ORAL_CAPSULE | Freq: Every day | ORAL | Status: DC
  Administered 2021-08-28 – 2021-08-30 (×3): 50 mg via ORAL
  Filled 2021-08-28 (×5): qty 1

## 2021-08-28 MED ORDER — HYDROMORPHONE HCL 1 MG/ML IJ SOLN
INTRAMUSCULAR | Status: AC
  Administered 2021-08-28: 0.5 mg via INTRAVENOUS
  Filled 2021-08-28: qty 1

## 2021-08-28 MED ORDER — PHENYLEPHRINE HCL (PRESSORS) 10 MG/ML IV SOLN
INTRAVENOUS | Status: AC
  Filled 2021-08-28: qty 2

## 2021-08-28 MED ORDER — CHLORHEXIDINE GLUCONATE CLOTH 2 % EX PADS: 6.0000 | MEDICATED_PAD | Freq: Once | CUTANEOUS | Status: DC

## 2021-08-28 MED ORDER — 0.9 % SODIUM CHLORIDE (POUR BTL) OPTIME
TOPICAL | Status: DC | PRN
  Administered 2021-08-28: 1000 mL

## 2021-08-28 MED ORDER — HEMOSTATIC AGENTS (NO CHARGE) OPTIME
TOPICAL | Status: DC | PRN
  Administered 2021-08-28 (×3): 1 via TOPICAL

## 2021-08-28 MED ORDER — ONDANSETRON 4 MG PO TBDP: 4.0000 mg | ORAL_TABLET | Freq: Four times a day (QID) | ORAL | Status: DC | PRN

## 2021-08-28 MED ORDER — ACETAMINOPHEN 500 MG PO TABS
1000.0000 mg | ORAL_TABLET | Freq: Four times a day (QID) | ORAL | Status: DC
  Administered 2021-08-28 – 2021-08-31 (×10): 1000 mg via ORAL
  Filled 2021-08-28 (×10): qty 2

## 2021-08-28 MED ORDER — OXYCODONE HCL 5 MG PO TABS: 5.0000 mg | ORAL_TABLET | Freq: Once | ORAL | Status: AC | PRN

## 2021-08-28 MED ORDER — ALBUTEROL SULFATE (2.5 MG/3ML) 0.083% IN NEBU: 3.0000 mL | INHALATION_SOLUTION | Freq: Four times a day (QID) | RESPIRATORY_TRACT | Status: DC | PRN

## 2021-08-28 MED ORDER — TIOTROPIUM BROMIDE MONOHYDRATE 1.25 MCG/ACT IN AERS: 2.0000 | INHALATION_SPRAY | Freq: Every day | RESPIRATORY_TRACT | Status: DC

## 2021-08-28 MED ORDER — METHOCARBAMOL 500 MG PO TABS
ORAL_TABLET | ORAL | Status: AC
  Administered 2021-08-28: 500 mg via ORAL
  Filled 2021-08-28: qty 1

## 2021-08-28 MED ORDER — MONTELUKAST SODIUM 10 MG PO TABS
10.0000 mg | ORAL_TABLET | Freq: Every day | ORAL | Status: DC
  Administered 2021-08-28 – 2021-08-30 (×3): 10 mg via ORAL
  Filled 2021-08-28 (×3): qty 1

## 2021-08-28 MED ORDER — MORPHINE SULFATE (PF) 2 MG/ML IV SOLN
2.0000 mg | INTRAVENOUS | Status: DC | PRN
  Administered 2021-08-28 – 2021-08-29 (×9): 2 mg via INTRAVENOUS
  Filled 2021-08-28 (×9): qty 1

## 2021-08-28 MED ORDER — LACTATED RINGERS IV SOLN: INTRAVENOUS | Status: DC

## 2021-08-28 MED ORDER — LIDOCAINE 2% (20 MG/ML) 5 ML SYRINGE
INTRAMUSCULAR | Status: DC | PRN
  Administered 2021-08-28: 100 mg via INTRAVENOUS

## 2021-08-28 MED ORDER — KETOROLAC TROMETHAMINE 30 MG/ML IJ SOLN: 30.0000 mg | Freq: Once | INTRAMUSCULAR | Status: AC | PRN

## 2021-08-28 MED ORDER — PRAZOSIN HCL 1 MG PO CAPS
1.0000 mg | ORAL_CAPSULE | Freq: Every day | ORAL | Status: DC
  Administered 2021-08-28 – 2021-08-30 (×3): 1 mg via ORAL
  Filled 2021-08-28 (×5): qty 1

## 2021-08-28 MED ORDER — PROPOFOL 10 MG/ML IV BOLUS
INTRAVENOUS | Status: DC | PRN
  Administered 2021-08-28: 200 mg via INTRAVENOUS
  Administered 2021-08-28: 50 mg via INTRAVENOUS

## 2021-08-28 MED ORDER — FLUOXETINE HCL 20 MG PO CAPS
40.0000 mg | ORAL_CAPSULE | Freq: Every day | ORAL | Status: DC
  Administered 2021-08-28 – 2021-08-30 (×3): 40 mg via ORAL
  Filled 2021-08-28 (×3): qty 2

## 2021-08-28 MED ORDER — ACETAMINOPHEN 500 MG PO TABS
1000.0000 mg | ORAL_TABLET | ORAL | Status: AC
  Administered 2021-08-28: 1000 mg via ORAL
  Filled 2021-08-28: qty 2

## 2021-08-28 MED ORDER — PROPOFOL 10 MG/ML IV BOLUS
INTRAVENOUS | Status: AC
  Filled 2021-08-28: qty 40

## 2021-08-28 MED ORDER — METHOCARBAMOL 500 MG PO TABS
500.0000 mg | ORAL_TABLET | Freq: Three times a day (TID) | ORAL | Status: DC
  Administered 2021-08-28 – 2021-08-29 (×2): 500 mg via ORAL
  Filled 2021-08-28 (×3): qty 1

## 2021-08-28 MED ORDER — MIDAZOLAM HCL 2 MG/2ML IJ SOLN: 2.0000 mg | Freq: Once | INTRAMUSCULAR | Status: AC

## 2021-08-28 MED ORDER — KETOROLAC TROMETHAMINE 30 MG/ML IJ SOLN
INTRAMUSCULAR | Status: AC
  Administered 2021-08-28: 30 mg via INTRAVENOUS
  Filled 2021-08-28: qty 1

## 2021-08-28 MED ORDER — ROPIVACAINE HCL 5 MG/ML IJ SOLN
INTRAMUSCULAR | Status: DC | PRN
  Administered 2021-08-28: 30 mL

## 2021-08-28 MED ORDER — LAMOTRIGINE 100 MG PO TABS
200.0000 mg | ORAL_TABLET | Freq: Every day | ORAL | Status: DC
  Administered 2021-08-28 – 2021-08-30 (×3): 200 mg via ORAL
  Filled 2021-08-28 (×3): qty 2

## 2021-08-28 MED ORDER — FENTANYL CITRATE (PF) 250 MCG/5ML IJ SOLN
INTRAMUSCULAR | Status: AC
  Filled 2021-08-28: qty 5

## 2021-08-28 MED ORDER — UMECLIDINIUM BROMIDE 62.5 MCG/ACT IN AEPB
1.0000 | INHALATION_SPRAY | Freq: Every day | RESPIRATORY_TRACT | Status: DC
  Administered 2021-08-29 – 2021-08-31 (×2): 1 via RESPIRATORY_TRACT
  Filled 2021-08-28: qty 7

## 2021-08-28 SURGICAL SUPPLY — 50 items
ADH SKN CLS APL DERMABOND .7 (GAUZE/BANDAGES/DRESSINGS) ×1
APL PRP STRL LF DISP 70% ISPRP (MISCELLANEOUS) ×1
APPLIER CLIP 9.375 MED OPEN (MISCELLANEOUS) ×4
APR CLP MED 9.3 20 MLT OPN (MISCELLANEOUS) ×2
BAG COUNTER SPONGE SURGICOUNT (BAG) ×2 IMPLANT
BAG SPNG CNTER NS LX DISP (BAG) ×1
BINDER BREAST LRG (GAUZE/BANDAGES/DRESSINGS) IMPLANT
BINDER BREAST XLRG (GAUZE/BANDAGES/DRESSINGS) IMPLANT
BINDER BREAST XXLRG (GAUZE/BANDAGES/DRESSINGS) ×1 IMPLANT
BIOPATCH BLUE 3/4IN DISK W/1.5 (GAUZE/BANDAGES/DRESSINGS) ×1 IMPLANT
BIOPATCH RED 1 DISK 7.0 (GAUZE/BANDAGES/DRESSINGS) ×3 IMPLANT
CHLORAPREP W/TINT 26 (MISCELLANEOUS) ×2 IMPLANT
CLIP APPLIE 9.375 MED OPEN (MISCELLANEOUS) IMPLANT
CLSR STERI-STRIP ANTIMIC 1/2X4 (GAUZE/BANDAGES/DRESSINGS) ×1 IMPLANT
COVER SURGICAL LIGHT HANDLE (MISCELLANEOUS) ×2 IMPLANT
DERMABOND ADVANCED (GAUZE/BANDAGES/DRESSINGS) ×1
DERMABOND ADVANCED .7 DNX12 (GAUZE/BANDAGES/DRESSINGS) ×1 IMPLANT
DRAIN CHANNEL 19F RND (DRAIN) ×3 IMPLANT
DRAPE TOP ARMCOVERS (MISCELLANEOUS) ×2 IMPLANT
DRAPE U-SHAPE 76X120 STRL (DRAPES) ×2 IMPLANT
DRSG PAD ABDOMINAL 8X10 ST (GAUZE/BANDAGES/DRESSINGS) ×6 IMPLANT
DRSG TEGADERM 4X4.75 (GAUZE/BANDAGES/DRESSINGS) ×2 IMPLANT
ELECT BLADE 4.0 EZ CLEAN MEGAD (MISCELLANEOUS) ×2
ELECT CAUTERY BLADE 6.4 (BLADE) ×2 IMPLANT
ELECT REM PT RETURN 9FT ADLT (ELECTROSURGICAL) ×2
ELECTRODE BLDE 4.0 EZ CLN MEGD (MISCELLANEOUS) ×1 IMPLANT
ELECTRODE REM PT RTRN 9FT ADLT (ELECTROSURGICAL) ×1 IMPLANT
EVACUATOR SILICONE 100CC (DRAIN) ×3 IMPLANT
GLOVE SURG ENC MOIS LTX SZ7 (GLOVE) ×2 IMPLANT
GLOVE SURG UNDER POLY LF SZ7.5 (GLOVE) ×2 IMPLANT
GOWN STRL REUS W/ TWL LRG LVL3 (GOWN DISPOSABLE) ×3 IMPLANT
GOWN STRL REUS W/TWL LRG LVL3 (GOWN DISPOSABLE) ×6
HEMOSTAT ARISTA ABSORB 3G PWDR (HEMOSTASIS) ×3 IMPLANT
HEMOSTAT SNOW SURGICEL 2X4 (HEMOSTASIS) ×1 IMPLANT
KIT BASIN OR (CUSTOM PROCEDURE TRAY) ×2 IMPLANT
KIT TURNOVER KIT B (KITS) ×2 IMPLANT
NS IRRIG 1000ML POUR BTL (IV SOLUTION) ×2 IMPLANT
PACK GENERAL/GYN (CUSTOM PROCEDURE TRAY) ×2 IMPLANT
PAD ABD 8X10 STRL (GAUZE/BANDAGES/DRESSINGS) ×1 IMPLANT
PAD ARMBOARD 7.5X6 YLW CONV (MISCELLANEOUS) ×2 IMPLANT
PENCIL SMOKE EVACUATOR (MISCELLANEOUS) ×2 IMPLANT
STAPLER VISISTAT 35W (STAPLE) ×1 IMPLANT
STRIP CLOSURE SKIN 1/2X4 (GAUZE/BANDAGES/DRESSINGS) ×3 IMPLANT
SUT ETHILON 2 0 FS 18 (SUTURE) ×3 IMPLANT
SUT ETHILON 3 0 PS 1 (SUTURE) ×4 IMPLANT
SUT MNCRL AB 4-0 PS2 18 (SUTURE) ×3 IMPLANT
SUT VIC AB 2-0 SH 18 (SUTURE) ×3 IMPLANT
SUT VIC AB 3-0 SH 18 (SUTURE) ×6 IMPLANT
TOWEL GREEN STERILE (TOWEL DISPOSABLE) ×2 IMPLANT
TOWEL GREEN STERILE FF (TOWEL DISPOSABLE) ×2 IMPLANT

## 2021-08-28 NOTE — Telephone Encounter (Signed)
Left message to return call and schedule a follow up.  

## 2021-08-28 NOTE — Anesthesia Procedure Notes (Signed)
Anesthesia Regional Block: Pectoralis block   Pre-Anesthetic Checklist: , timeout performed,  Correct Patient, Correct Site, Correct Laterality,  Correct Procedure, Correct Position, site marked,  Risks and benefits discussed,  Surgical consent,  Pre-op evaluation,  At surgeon's request and post-op pain management  Laterality: Left  Prep: chloraprep       Needles:  Injection technique: Single-shot  Needle Type: Echogenic Needle     Needle Length: 9cm      Additional Needles:   Procedures:,,,, ultrasound used (permanent image in chart),,    Narrative:  Start time: 08/28/2021 9:25 AM End time: 08/28/2021 9:35 AM Injection made incrementally with aspirations every 5 mL.  Performed by: Personally  Anesthesiologist: Myrtie Soman, MD  Additional Notes: Patient tolerated the procedure well without complications

## 2021-08-28 NOTE — Telephone Encounter (Signed)
-----   Message from Doran Stabler, MD sent at 08/28/2021  6:10 AM EST ----- Please arrange a clinic visit with me for this patient.  - HD

## 2021-08-28 NOTE — Op Note (Signed)
Preoperative diagnosis: Cutaneous recurrence of left breast cancer Postoperative diagnosis: Same as above Procedure: 1.  Left mastectomy 2.  Fishtail plasty closure of lateral portion of the wound Surgeon: Dr. Serita Grammes Anesthesia: General with a pectoral block Estimated blood loss: 100 cc Specimens: Left breast tissue marked short superior, long lateral Drains: 19 [place] Blake drain Complications: None Sponge and count was correct x2 at completion Decision to recovery stable condition  Indications: This is a 50 year old female has a history of stage I left breast cancer who underwent breast conservation therapy in 2015.  She this was followed by radiotherapy.  I then saw her for a right axillary mass and a being a sebaceous cyst that I excised.  I biopsied some skin lesion on her left breast and this ends up being a recurrence of her breast cancer.  She has no other evidence of any stage IV disease.  I discussed going to the operating room for a left mastectomy to remove all of this abnormal skin.  Procedure: After informed consent was obtained the patient was taken the operating.  She was given antibiotics.  SCDs were in place.  She had undergone a pectoral block.  She was prepped and draped in the standard sterile surgical fashion.  A surgical timeout was then performed.  I had marked out all of the skin lesions.  This was a very large piece of skin that needed to be removed to clear her disease.  I then made a large elliptical incision encompassing all of the skin metastases.  I created flaps to the inframammary fold, sternum, clavicle, latissimus laterally.  I developed the flap below the inframammary fold to completely obliterate the inframammary fold for later closure as well.  With some difficulty due to her prior surgery and radiation I then remove the breast and the fascia and passed this off the table.  This was a large defect and I was not going to be able to close this in the  standard fashion.  At this point I elected to do a fishtail plasty of the lateral portion of the incision.  I used staples to tack the incision together after obtaining hemostasis.  I then removed to very large areas of excess skin and tissue in the lateral portion and pulled the midpoint of this up to the crotch of the incision.  I then closed the medial aspect after placing a 19 [place] Blake drain with 3-0 Vicryl.  The lateral portions were then closed with 3-0 Vicryl after applying Arista throughout.  The drain was secured with a 2-0 nylon.  The skin was closed throughout with a 4-0 Monocryl and I placed numerous 3-0 nylon's to put this together as well.  There is certainly some risk with the Y portion of this incision but this was the only real way I could get this closed and try to give her as flat a closure as possible due to the large amount of tissue that needed to be removed at the time of surgery.  Steri-Strips and glue were then placed.  She tolerated this well was extubated transferred to recovery stable.

## 2021-08-28 NOTE — H&P (Signed)
50 y.o. female who is seen today for postopfollow up after excision of a right axillary sebaceous cyst as well as a punch biopsy of some left breast skin lesions.  She has a history of stage I left breast cancer, right breast MRI abnormality, and right axillary accessory breast tissue who underwent left breast lumpectomy, left axillary sentinel lymph node biopsy, right breast excisional biopsy, and right axillary accessory breast tissue excision by Dr. Donne Hazel in 2015.   She is presenting per request of the [facility] who evaluated her 2 days ago on 05/24/2021. She underwent bilateral screening mammograms and was found to have a right axillary sebaceous cyst/abscess, 2.3 x 1.5 x 2.3 cm. It was also recommended that she consider a dermatologist consultation for the indeterminant skin lesions within the upper and outer left breast.  I then took her to the operating room after the visit this visit with the results as above. She has been followed by oncology. She has no evidence of any stage IV disease at this point. She is here to discuss mastectomy.  Review of Systems: A complete review of systems was obtained from the patient. I have reviewed this information and discussed as appropriate with the patient. See HPI as well for other ROS.  Review of Systems  All other systems reviewed and are negative.   Medical History: Past Medical History:  Diagnosis Date   Anemia   Anxiety   COPD (chronic obstructive pulmonary disease) (CMS-HCC)   History of cancer   Patient Active Problem List  Diagnosis   Malignant neoplasm of upper-outer quadrant of left breast in female, estrogen receptor positive (CMS-HCC)   Past Surgical History:  Procedure Laterality Date   DEEP AXILLARY SENTINEL NODE BIOPSY / EXCISION Left   gallbladder surgery N/A   MASTECTOMY PARTIAL / LUMPECTOMY Left    No Known Allergies  Current Outpatient Medications on File Prior to Visit  Medication Sig Dispense Refill    busPIRone (BUSPAR) 10 MG tablet buspirone 10 mg tablet TK 1 T PO BID   doxepin (SINEQUAN) 50 MG capsule doxepin 50 mg capsule TK 1 C PO Q NIGHT   FLUoxetine (PROZAC) 40 MG capsule fluoxetine 40 mg capsule TK 2 CS PO ONCE D   lamoTRIgine (LAMICTAL) 200 MG tablet lamotrigine 200 mg tablet TK 1 T PO ONCE D   montelukast (SINGULAIR) 10 mg tablet   prazosin (MINIPRESS) 5 MG capsule prazosin 5 mg capsule TK 2 CS PO Q NIGHT   QUEtiapine (SEROQUEL) 400 MG tablet quetiapine 400 mg tablet TK 1 T PO QPM    Family History  Problem Relation Age of Onset   Obesity Mother   High blood pressure (Hypertension) Mother   Hyperlipidemia (Elevated cholesterol) Mother   Breast cancer Mother   Myocardial Infarction (Heart attack) Mother   High blood pressure (Hypertension) Father   Hyperlipidemia (Elevated cholesterol) Father   Obesity Sister   High blood pressure (Hypertension) Sister   Hyperlipidemia (Elevated cholesterol) Sister    Social History   Tobacco Use  Smoking Status Never Smoker  Smokeless Tobacco Never Used    Social History   Socioeconomic History   Marital status: Widowed  Tobacco Use   Smoking status: Never Smoker   Smokeless tobacco: Never Used  Substance and Sexual Activity   Drug use: Never   Objective:   Physical Exam Constitutional:  Appearance: Normal appearance.  Neurological:  Mental Status: She is alert.  Right axillary incision healing well without infection Left breast with multiple  skin nodules no mass  Assessment and Plan:  Left mastectomy  I discussed her path with her today. I think it is most reasonable as she has no other disease to do a mastectomy on the left with the skin recurrences. I do think I can remove all of the involved skin. I discussed a mastectomy with removal of the nipple and the areola. We discussed an overnight hospital stay and the drain. Risks and recovery were both discussed. I am going to schedule her as soon as possible.

## 2021-08-28 NOTE — Anesthesia Preprocedure Evaluation (Addendum)
Anesthesia Evaluation  Patient identified by MRN, date of birth, ID band Patient awake    Reviewed: Allergy & Precautions, NPO status , Patient's Chart, lab work & pertinent test results  Airway Mallampati: II  TM Distance: >3 FB Neck ROM: Full    Dental no notable dental hx.    Pulmonary COPD, Current Smoker and Patient abstained from smoking.,    Pulmonary exam normal breath sounds clear to auscultation       Cardiovascular negative cardio ROS Normal cardiovascular exam Rhythm:Regular Rate:Normal     Neuro/Psych Bipolar Disorder negative neurological ROS     GI/Hepatic negative GI ROS, Neg liver ROS,   Endo/Other  diabetesMorbid obesity  Renal/GU negative Renal ROS  negative genitourinary   Musculoskeletal negative musculoskeletal ROS (+)   Abdominal   Peds negative pediatric ROS (+)  Hematology  (+) anemia ,   Anesthesia Other Findings   Reproductive/Obstetrics negative OB ROS                            Anesthesia Physical Anesthesia Plan  ASA: 3  Anesthesia Plan: General   Post-op Pain Management:  Regional for Post-op pain   Induction: Intravenous  PONV Risk Score and Plan: 2 and Ondansetron, Dexamethasone and Treatment may vary due to age or medical condition  Airway Management Planned: Oral ETT  Additional Equipment:   Intra-op Plan:   Post-operative Plan: Extubation in OR  Informed Consent: I have reviewed the patients History and Physical, chart, labs and discussed the procedure including the risks, benefits and alternatives for the proposed anesthesia with the patient or authorized representative who has indicated his/her understanding and acceptance.     Dental advisory given  Plan Discussed with: CRNA and Surgeon  Anesthesia Plan Comments:       Anesthesia Quick Evaluation

## 2021-08-28 NOTE — Transfer of Care (Signed)
Immediate Anesthesia Transfer of Care Note  Patient: Kimberly [name]  Procedure(s) Performed: LEFT TOTAL MASTECTOMY (Left: Breast)  Patient Location: PACU  Anesthesia Type:GA combined with regional for post-op pain  Level of Consciousness: drowsy, patient cooperative and responds to stimulation  Airway & Oxygen Therapy: Patient Spontanous Breathing and Patient connected to face mask oxygen  Post-op Assessment: Report given to RN, Post -op Vital signs reviewed and stable and Patient moving all extremities X 4  Post vital signs: Reviewed and stable  Last Vitals:  Vitals Value Taken Time  BP 159/92 08/28/21 1251  Temp    Pulse 95 08/28/21 1251  Resp 16 08/28/21 1251  SpO2 99 % 08/28/21 1251  Vitals shown include unvalidated device data.  Last Pain:  Vitals:   08/28/21 0854  PainSc: 0-No pain         Complications: No notable events documented.

## 2021-08-28 NOTE — Anesthesia Procedure Notes (Signed)
Anesthesia Procedure Image    

## 2021-08-28 NOTE — Interval H&P Note (Signed)
History and Physical Interval Note:  08/28/2021 9:31 AM  Kimberly [name]  has presented today for surgery, with the diagnosis of LEFT BREAST CANCER.  The various methods of treatment have been discussed with the patient and family. After consideration of risks, benefits and other options for treatment, the patient has consented to  Procedure(s) with comments: LEFT TOTAL MASTECTOMY (Left) - 90 MINUTES- POST PER KELLY AND SHE WILL PUT IN ROOM as a surgical intervention.  The patient's history has been reviewed, patient examined, no change in status, stable for surgery.  I have reviewed the patient's chart and labs.  Questions were answered to the patient's satisfaction.     [name]

## 2021-08-28 NOTE — Anesthesia Procedure Notes (Signed)
Procedure Name: LMA Insertion Date/Time: 08/28/2021 10:16 AM Performed by: Rande Brunt, CRNA Pre-anesthesia Checklist: Patient identified, Emergency Drugs available, Suction available and Patient being monitored Patient Re-evaluated:Patient Re-evaluated prior to induction Oxygen Delivery Method: Circle System Utilized Preoxygenation: Pre-oxygenation with 100% oxygen Induction Type: IV induction Ventilation: Mask ventilation without difficulty LMA: LMA inserted LMA Size: 4.0 Number of attempts: 1 Airway Equipment and Method: Bite block Placement Confirmation: positive ETCO2, CO2 detector and breath sounds checked- equal and bilateral Tube secured with: Tape Dental Injury: Teeth and Oropharynx as per pre-operative assessment

## 2021-08-28 NOTE — Discharge Instructions (Signed)
[facility], [place] [phone]  MASTECTOMY: POST OP INSTRUCTIONS Take 400 mg of ibuprofen every 8 hours or 650 mg tylenol every 6 hours for next 72 hours then as needed. Use ice several times daily also. Always review your discharge instruction sheet given to you by the facility where your surgery was performed. IF YOU HAVE DISABILITY OR FAMILY LEAVE FORMS, YOU MUST BRING THEM TO THE OFFICE FOR PROCESSING.   DO NOT GIVE THEM TO YOUR DOCTOR. A prescription for pain medication may be given to you upon discharge.  Take your pain medication as prescribed, if needed.  If narcotic pain medicine is not needed, then you may take acetaminophen (Tylenol), naprosyn (Alleve) or ibuprofen (Advil) as needed. Take your usually prescribed medications unless otherwise directed. If you need a refill on your pain medication, please contact your pharmacy.  They will contact our office to request authorization.  Prescriptions will not be filled after 5pm or on week-ends. You should follow a light diet the first few days after arrival home, such as soup and crackers, etc.  Resume your normal diet the day after surgery. Most patients will experience some swelling and bruising on the chest and underarm.  Ice packs will help.  Swelling and bruising can take several days to resolve. Wear the binder day and night until you return to the office.  It is common to experience some constipation if taking pain medication after surgery.  Increasing fluid intake and taking a stool softener (such as Colace) will usually help or prevent this problem from occurring.  A mild laxative (Milk of Magnesia or Miralax) should be taken according to package instructions if there are no bowel movements after 48 hours. Unless discharge instructions indicate otherwise, leave your bandage dry and in place until your next appointment in 3-5 days.  You may take a limited sponge bath.  No tube baths or showers until the drains are removed.   You may have steri-strips (small skin tapes) in place directly over the incision.  These strips should be left on the skin for 7-10 days. If you have glue it will come off in next couple week.  Any sutures will be removed at an office visit DRAINS:  If you have drains in place, it is important to keep a list of the amount of drainage produced each day in your drains.  Before leaving the hospital, you should be instructed on drain care.  Call our office if you have any questions about your drains. I will remove your drains when they put out less than 30 cc or ml for 2 consecutive days. ACTIVITIES:  You may resume regular (light) daily activities beginning the next day--such as daily self-care, walking, climbing stairs--gradually increasing activities as tolerated.  You may have sexual intercourse when it is comfortable.  Refrain from any heavy lifting or straining until approved by your doctor. You may drive when you are no longer taking prescription pain medication, you can comfortably wear a seatbelt, and you can safely maneuver your car and apply brakes. RETURN TO WORK:  __________________________________________________________ Dennis Bast should see your doctor in the office for a follow-up appointment approximately 3-5 days after your surgery.  Your doctor's nurse will typically make your follow-up appointment when she calls you with your pathology report.  Expect your pathology report 3-4business days after surgery. OTHER INSTRUCTIONS: ______________________________________________________________________________________________ ____________________________________________________________________________________________ WHEN TO CALL YOUR DR Kimberly [name]: Fever over 101.0 Nausea and/or vomiting Extreme swelling or bruising Continued bleeding from incision. Increased pain, redness,  or drainage from the incision. The clinic staff is available to answer your questions during regular business hours.  Please don't  hesitate to call and ask to speak to one of the nurses for clinical concerns.  If you have a medical emergency, go to the nearest emergency room or call 911.  A surgeon from [facility] is always on call at the hospital. [address] [place], [place], [place]  [address] ? [address], [place], [place]   [address] [phone] ? [phone] ? FAX [phone] Web site: [email or web address]

## 2021-08-29 ENCOUNTER — Inpatient Hospital Stay: Payer: Medicare Other

## 2021-08-29 ENCOUNTER — Encounter (HOSPITAL_COMMUNITY): Payer: Self-pay | Admitting: General Surgery

## 2021-08-29 ENCOUNTER — Inpatient Hospital Stay (HOSPITAL_BASED_OUTPATIENT_CLINIC_OR_DEPARTMENT_OTHER): Payer: Medicare Other | Admitting: Oncology

## 2021-08-29 DIAGNOSIS — C50912 Malignant neoplasm of unspecified site of left female breast: Secondary | ICD-10-CM

## 2021-08-29 DIAGNOSIS — C50412 Malignant neoplasm of upper-outer quadrant of left female breast: Secondary | ICD-10-CM

## 2021-08-29 DIAGNOSIS — Z170 Estrogen receptor positive status [ER+]: Secondary | ICD-10-CM

## 2021-08-29 MED ORDER — MORPHINE SULFATE (PF) 2 MG/ML IV SOLN
2.0000 mg | INTRAVENOUS | Status: DC | PRN
  Administered 2021-08-29 – 2021-08-30 (×3): 2 mg via INTRAVENOUS
  Filled 2021-08-29 (×4): qty 1

## 2021-08-29 MED ORDER — GABAPENTIN 100 MG PO CAPS
100.0000 mg | ORAL_CAPSULE | Freq: Every day | ORAL | Status: DC
  Administered 2021-08-29 – 2021-08-30 (×2): 100 mg via ORAL
  Filled 2021-08-29 (×2): qty 1

## 2021-08-29 MED ORDER — MORPHINE SULFATE (PF) 2 MG/ML IV SOLN
2.0000 mg | Freq: Once | INTRAVENOUS | Status: AC
  Administered 2021-08-29: 2 mg via INTRAVENOUS

## 2021-08-29 MED ORDER — METHOCARBAMOL 750 MG PO TABS
750.0000 mg | ORAL_TABLET | Freq: Three times a day (TID) | ORAL | Status: DC
  Administered 2021-08-29 (×2): 750 mg via ORAL
  Filled 2021-08-29 (×2): qty 1

## 2021-08-29 NOTE — Progress Notes (Signed)
1 Day Post-Op   Subjective/Chief Complaint: Pain at surgical site, having difficulty getting around nad up   Objective: Vital signs in last 24 hours: Temp:  [97 F (36.1 C)-98.6 F (37 C)] 98.6 F (37 C) (11/08 0441) Pulse Rate:  [62-100] 62 (11/08 0441) Resp:  [10-39] 18 (11/08 0441) BP: (108-165)/(53-92) 108/63 (11/08 0441) SpO2:  [96 %-100 %] 96 % (11/08 0441) Weight:  [132 kg] 132 kg (11/07 0845)    Intake/Output from previous day: 11/07 0701 - 11/08 0700 In: 1300 [I.V.:1300] Out: 203 [Drains:103; Blood:100] Intake/Output this shift: No intake/output data recorded.  General NAD CV regular Pulm effort normal Left mastectomy without hematoma, drain as expected  Lab Results:  No results for input(s): WBC, HGB, HCT, PLT in the last 72 hours. BMET No results for input(s): NA, K, CL, CO2, GLUCOSE, BUN, CREATININE, CALCIUM in the last 72 hours. PT/INR No results for input(s): LABPROT, INR in the last 72 hours. ABG No results for input(s): PHART, HCO3 in the last 72 hours.  Invalid input(s): PCO2, PO2  Studies/Results: No results found.  Anti-infectives: Anti-infectives (From admission, onward)    Start     Dose/Rate Route Frequency Ordered Stop   08/28/21 0845  ceFAZolin (ANCEF) IVPB 3g/100 mL premix        3 g 200 mL/hr over 30 Minutes Intravenous On call to O.R. 08/28/21 0833 08/28/21 1020       Assessment/Plan: POD 1 left mastectomy -adjust pain control today -oob -saline lock, carb modified diet -pt/ot today to assess for assistance at home -home meds -hopefully home tomorrow -path pending  [name] 08/29/2021

## 2021-08-29 NOTE — Evaluation (Signed)
Occupational Therapy Evaluation Patient Details Name: Kimberly [name] MRN: [id number] DOB: February 03, 1971 Today's Date: 08/29/2021   History of Present Illness Patient is a 50 y/o female who presents s/p left total mastectomy 08/28/21 secondary to breast ca. PMH includes bipolar disorder, tobacco abuse, breast ca, COPD, DM, Covid 19,   Clinical Impression   PTA, pt was living with her children and was performing BADLs; family does IADLs including driving. Pt currently requiring Min A for ADLs and Min Guard A for functional mobility.  Pt presenting with decreased functional ROM of BUEs, balance, and functional performance. Initiating education on compensatory techniques for dressing. Pt with JP drain site bleed; RN made aware. Pt would benefit from further acute OT to facilitate safe dc. Recommend dc to home with [facility] aide to facilitate safe transition to home.      Recommendations for follow up therapy are one component of a multi-disciplinary discharge planning process, led by the attending physician.  Recommendations may be updated based on patient status, additional functional criteria and insurance authorization.   Follow Up Recommendations  Home health OT ([place] aide, [facility] RN)    Assistance Recommended at Discharge Intermittent Supervision/Assistance  Functional Status Assessment  Patient has had a recent decline in their functional status and demonstrates the ability to make significant improvements in function in a reasonable and predictable amount of time.  Equipment Recommendations  None recommended by OT    Recommendations for Other Services PT consult     Precautions / Restrictions Precautions Precautions: Fall Precaution Comments: JP drain Restrictions Weight Bearing Restrictions: No      Mobility Bed Mobility               General bed mobility comments: Sitting at EOB upon arrival having recently finished walk with PT    Transfers Overall transfer level:  Needs assistance   Transfers: Sit to/from Stand Sit to Stand: Min guard           General transfer comment: MIn GUard A for safety      Balance Overall balance assessment: Mild deficits observed, not formally tested                                         ADL either performed or assessed with clinical judgement   ADL Overall ADL's : Needs assistance/impaired Eating/Feeding: Set up;Sitting   Grooming: Set up;Supervision/safety;Sitting   Upper Body Bathing: Minimal assistance;Sitting   Lower Body Bathing: Minimal assistance;Sit to/from stand   Upper Body Dressing : Minimal assistance;Sitting Upper Body Dressing Details (indicate cue type and reason): Initating education on donning LUE first into shirts. Will need practice Lower Body Dressing: Minimal assistance;Sit to/from stand Lower Body Dressing Details (indicate cue type and reason): Educating on figure four method for don LB clothing Toilet Transfer: Min guard;Ambulation (simulated to recliner) Toilet Transfer Details (indicate cue type and reason): MIn Guard A for safety         Functional mobility during ADLs: Min guard General ADL Comments: Initating education on compensatory techniques for dressing. Pt presenting with decreased activity tolerance. Pt also with bleeding at drain site; RN aware     Vision         Perception     Praxis      Pertinent Vitals/Pain Pain Assessment: 0-10 Pain Score: 7  Pain Location: BUEs/shoulder Pain Descriptors / Indicators: Sore;Aching Pain Intervention(s): Repositioned;Monitored during session;Limited  activity within patient's tolerance     Hand Dominance Right   Extremity/Trunk Assessment Upper Extremity Assessment Upper Extremity Assessment: RUE deficits/detail;LUE deficits/detail RUE Deficits / Details: Limited shoulder flexion to ~100degrees. RUE Coordination: decreased gross motor LUE Deficits / Details: Limited shoulder ROM due to pain. LUE  Coordination: decreased gross motor   Lower Extremity Assessment Lower Extremity Assessment: Defer to PT evaluation   Cervical / Trunk Assessment Cervical / Trunk Assessment: Other exceptions Cervical / Trunk Exceptions: s/p left mastectomy   Communication Communication Communication: No difficulties   Cognition Arousal/Alertness: Awake/alert;Lethargic Behavior During Therapy: WFL for tasks assessed/performed Overall Cognitive Status: Impaired/Different from baseline Area of Impairment: Safety/judgement;Problem solving;Attention                   Current Attention Level: Selective     Safety/Judgement: Decreased awareness of safety   Problem Solving: Slow processing;Requires verbal cues General Comments: Pt requiring increased time during ADLs and conversation. Feel this is possibly due to medication. Following simple commands and very motivated to participate in therapy     General Comments       Exercises     Shoulder Instructions      Home Living Family/patient expects to be discharged to:: Private residence Living Arrangements: Alone Available Help at Discharge: Family;Available PRN/intermittently Type of Home: House Home Access: Stairs to enter [employer] of Steps: 3 Entrance Stairs-Rails: None Home Layout: One level;Laundry or work area in basement     [employer] Shower/Tub: Occupational psychologist: [place]: Advice worker (2 wheels);Rollator (4 wheels) (shower seat old)          Prior Functioning/Environment Prior Level of Function : Independent/Modified Independent               ADLs Comments: Does own ADLs, does not drive. Does not do cooking/cleaning.        OT Problem List: Decreased strength;Decreased range of motion;Decreased activity tolerance;Impaired balance (sitting and/or standing);Decreased knowledge of use of DME or AE;Decreased knowledge of precautions;Pain;Impaired UE  functional use      OT Treatment/Interventions: Self-care/ADL training;Therapeutic exercise;Energy conservation;DME and/or AE instruction;Therapeutic activities;Patient/family education    OT Goals(Current goals can be found in the care plan section) Acute Rehab OT Goals Patient Stated Goal: Go home OT Goal Formulation: With patient/family Time For Goal Achievement: 09/12/21 Potential to Achieve Goals: Good  OT Frequency: Min 2X/week   Barriers to D/C:            Co-evaluation              AM-PAC OT "6 Clicks" Daily Activity     Outcome Measure Help from another person eating meals?: A Little Help from another person taking care of personal grooming?: A Little Help from another person toileting, which includes using toliet, bedpan, or urinal?: A Little Help from another person bathing (including washing, rinsing, drying)?: A Little Help from another person to put on and taking off regular upper body clothing?: A Little Help from another person to put on and taking off regular lower body clothing?: A Little 6 Click Score: 18   End of Session Nurse Communication: Mobility status;Other (comment) (blood at JP drain site)  Activity Tolerance: Patient tolerated treatment well Patient left: in chair;with call bell/phone within reach;with family/visitor present  OT Visit Diagnosis: Unsteadiness on feet (R26.81);Other abnormalities of gait and mobility (R26.89);Muscle weakness (generalized) (M62.81);Pain Pain - Right/Left: Left Pain - part of body:  (chest)  Time: 6834-1962 OT Time Calculation (min): 18 min Charges:  OT General Charges $OT Visit: 1 Visit OT Evaluation $OT Eval Low Complexity: 1 Low  Mortimer Bair MSOT, OTR/L Acute Rehab Pager: [phone] Office: [place] 08/29/2021, 12:42 PM

## 2021-08-29 NOTE — Progress Notes (Signed)
Mobility Specialist Progress Note:   08/29/21 1630  Mobility  Activity Ambulated in hall  Level of Assistance Contact guard assist, steadying assist  Assistive Device None  Distance Ambulated (ft) 100 ft  Mobility Ambulated independently in hallway  Mobility Response Tolerated well  Mobility performed by Mobility specialist  Transport method Ambulatory  $Mobility charge 1 Mobility   Pt c/o significant L side pain. Otherwise asx during ambulation, no LOBs.  Nelta Numbers Mobility Specialist  Phone [phone]

## 2021-08-29 NOTE — Progress Notes (Signed)
[place]  [phone] [phone]    ID: Kimberly [name] DOB: 07-25-1971  MR#: [id number]  GUR#:[id number]  Patient Care Team: Lurline Del, DO as PCP - General (Family Medicine) Reymundo Winship, Virgie Dad, MD as Consulting Physician (Oncology) [name], MD as Attending Physician (Radiation Oncology) [name], MD as Consulting Physician (General Surgery) Causey, Charlestine Massed, NP as Nurse Practitioner (Hematology and Oncology) [name], RPH-CPP (Pharmacist) [name]  CHIEF COMPLAINT: Recurrent estrogen receptor positive breast cancer; iron deficiency  CURRENT TREATMENT: Venofer; surgery pending; to start   INTERVAL HISTORY: Kimberly [name] returns today for follow-up of her recurrent estrogen receptor positive breast cancer.  To review, she presented with a right axillary mass, which underwent excision 07/25/2021.  This proved to be a benign epidermoid cyst. However [place] biopsy of the left breast skin showed metastatic carcinoma, estrogen and progesterone receptor both 95% positive, moderate to strong, HER2 negative by immunohistochemistry (1+).  The accession number is [id number].  She underwent staging head CT on 07/14/2021, which was negative. Restaging chest CT the same day was also negative for metastatic disease.  Recall she had a CT of the abdomen and pelvis 06/18/2021 which again showed no evidence of metastatic disease.  There was no evidence of bone lesions on any of these scans.  On 08/11/2021 she underwent EGD to evaluate the possible cause of her iron deficiency anemia.  With pathology showing chronic gastritis with intestinal metaplasia in the stomach but no evidence of dysplasia or malignancy and no H. pylori noted ([place] [id number]).  On 08/28/2021 she underwent left mastectomy.  She was supposed to have seen me 08/29/2021 but we are changing that appointment to 09/12/2021 since she is currently hospitalized  postop.     REVIEW OF SYSTEMS: Kimberly [name] tolerated the recent procedure well.  She has a little bit of discomfort in the right axilla she says, but no bleeding or suppuration.  She denies unusual headaches visual changes nausea vomiting balance issues or falls.  She denies any cough phlegm production or pleurisy.  She showed me a picture of blood in the toilet after a small bowel movement.  A detailed review of systems was otherwise stable.   COVID 19 VACCINATION STATUS:    BREAST CANCER HISTORY: From the original intake note:  The patient had bilateral screening mammography with tomography 03/19/2014. This was the patient's first ever mammography. Showed a possible mass in the left breast. Left diagnostic mammography and ultrasonography 04/01/2014 showed an irregular mass in the upper outer quadrant of the left breast with a possible satellite 1 cm anterior to it. On physical exam there was a firm palpable mass at the 1:00 position of the left breast 8 cm from the nipple. There was no palpable left axillary adenopathy. Ultrasound showed an irregular hypoechoic mass in the area in question measuring 1.4 cm. There was a 4 mm nodule located anterior to this. Ultrasound of the left axilla was benign.  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 The second mass was negative for malignancy. This was felt to be concordant.  On 04/08/2014 the patient underwent bilateral breast MRI. This showed a 9 mm enhancing mass in the subareolar area  of the right breast in in the left breast, the previously noted mass measuring 1.3 cm. There were no morphologically abnormal lymph nodes  The right breast finding  was followed up on 04/19/2014 with ultrasound which showed an intraductal soft tissue mass in the inferior subareolar worsening of the right breast measuring 8 mm. This was biopsied 04/19/2014 and showed ([id number]) ectatic duct, with no evidence of malignancy. Surgical excision was recommended.  Accordingly on 05/03/2014 the patient underwent right lumpectomy, showing an intraductal papilloma, with known malignancy identified. Left lumpectomy on the same day showed an invasive ductal carcinoma measuring 1.5 cm, with one of the 2 sentinel lymph nodes positive for carcinoma. There was no extracapsular extension. Margins were clear and ample. HER-2 was repeated and was again negative.  The patient's case was discussed in the multidisciplinary breast cancer conference 05/12/2014. An Oncotype had been previously requested and this showed a recurrent score of 22, in the intermediate range. The patient qualifies for the S1007 study and this will be discussed with her. Otherwise the standard recommendation would be chemotherapy followed by radiation followed by anti-estrogens.  The patient's subsequent history is as detailed below.   PAST MEDICAL HISTORY: Past Medical History:  Diagnosis Date   Acute cholecystitis 04/17/2019   Anemia    Anxiety    Arthritis    Bipolar 1 disorder ([place])    Blood transfusion without reported diagnosis 2007   post bleeding childbirth   Cancer ([place])    left breast cancer 2015   COPD (chronic obstructive pulmonary disease) ([place])    pt reports she is not on oxygen   COVID-19 virus infection 06/07/2019   Depression    Diabetes mellitus without complication ([place])    gestational   Hand injury, right, initial encounter 03/19/2019   Heart murmur    as a child-not adult-no cardiac work up   History of radiation therapy 08/31/14-10/21/14   left breast/ left supraclavicular 50.4 Gy 28 fx, lef tposterior axillary boost 9.52 Gy 28 fx, left rbeast boost/ 10 Gy 5 fx    Personal history of chemotherapy    Personal history of radiation therapy    Rash and nonspecific skin eruption 12/27/2020    PAST SURGICAL HISTORY: Past Surgical History:  Procedure Laterality Date   BREAST BIOPSY Left 04/01/14   BREAST BIOPSY Right 04/19/14   BREAST LUMPECTOMY WITH AXILLARY LYMPH NODE DISSECTION Bilateral 05/03/14   CHOLECYSTECTOMY N/A 04/18/2019   Procedure: LAPAROSCOPIC CHOLECYSTECTOMY WITH INTRAOPERATIVE CHOLANGIOGRAM;  Surgeon: Jovita Kussmaul, MD;  Location: [facility];  Service: General;  Laterality: N/A;   DILATION AND CURETTAGE OF UTERUS     after childbirth   MASS EXCISION Right 07/25/2021   Procedure: RIGHT AXILLARY MASS EXCISION;  Surgeon: [name], MD;  Location: [facility];  Service: General;  Laterality: Right;   MINOR BREAST BIOPSY Left 07/25/2021   Procedure: SKIN PUNCH BIOPSY LEFT BREAST;  Surgeon: [name], MD;  Location: [facility];  Service: General;  Laterality: Left;   MULTIPLE TOOTH EXTRACTIONS     only 5 left   PORTACATH PLACEMENT Right 06/18/2014   Procedure: INSERTION PORT-A-CATH;  Surgeon: [name], MD;  Location: [facility];  Service: General;  Laterality: Right;   TONSILLECTOMY AND ADENOIDECTOMY      FAMILY HISTORY Family History  Problem Relation Age of Onset   Heart disease Father    Cancer Father        Prostate   Colon cancer Maternal Grandfather    Colon  cancer Paternal Grandmother    Stomach cancer Neg Hx    Esophageal cancer Neg Hx    Rectal cancer Neg Hx   The patient's father is currently living at age 44. The patient's mother was murdered at age 62 by the patient's mother's sister. The patient has 2 brothers, one sister. The patient's father hase prostate cancer, diagnosed at age 31. There is no history of breast or ovarian cancer in the family.   GYNECOLOGIC HISTORY:  No LMP recorded. (Menstrual status: Perimenopausal). Menarche age 55, first live birth age 72, the patient is [place] P5. The patient  has not had a period in the last 2 months but tells me she has tested for pregnancy x2 and she is not pregnant.   SOCIAL HISTORY:  she is disabled secondary to a nervous breakdown. She is widowed-- her husband Luiz Ochoa passed away several years ago.  She lives at home with her children, ages 74, 26, and 69.  Her son was recently named valedictorian of his high school class.    ADVANCED DIRECTIVES:  not in place    HEALTH MAINTENANCE: Social History   Tobacco Use   Smoking status: Every Day    Packs/day: 1.00    Years: 34.00    Pack years: 34.00    Types: Cigarettes   Smokeless tobacco: Never  Vaping Use   Vaping Use: Never used  Substance Use Topics   Alcohol use: No   Drug use: No     Colonoscopy:  PAP:  Bone density:  Lipid panel:  No Known Allergies  No current facility-administered medications for this visit.   No current outpatient medications on file.   Facility-Administered Medications Ordered in Other Visits  Medication Dose Route Frequency Provider Last Rate Last Admin   0.9 %  sodium chloride infusion   Intravenous Continuous [name], MD 50 mL/hr at 08/28/21 1648 New Bag at 08/28/21 1648   acetaminophen (TYLENOL) tablet 1,000 mg  1,000 mg Oral Q6H [name], MD   1,000 mg at 08/29/21 0637   albuterol (PROVENTIL) (2.5 MG/3ML) 0.083% nebulizer solution 3 mL  3 mL Inhalation Q6H PRN [name], MD       buPROPion (WELLBUTRIN XL) 24 hr tablet 150 mg  150 mg Oral Daily [name], MD   150 mg at 08/28/21 1703   doxepin (SINEQUAN) capsule 50 mg  50 mg Oral QHS [name], MD   50 mg at 08/28/21 2255   FLUoxetine (PROZAC) capsule 40 mg  40 mg Oral QHS [name], MD   40 mg at 08/28/21 2042   lamoTRIgine (LAMICTAL) tablet 200 mg  200 mg Oral QHS [name], MD   200 mg at 08/28/21 2042   methocarbamol (ROBAXIN) tablet 500 mg  500 mg Oral TID [name], MD   500 mg at 08/28/21 2043   montelukast  (SINGULAIR) tablet 10 mg  10 mg Oral QHS [name], MD   10 mg at 08/28/21 2043   morphine 2 MG/ML injection 2 mg  2 mg Intravenous Q2H PRN [name], MD   2 mg at 08/29/21 0639   ondansetron (ZOFRAN-ODT) disintegrating tablet 4 mg  4 mg Oral Q6H PRN [name], MD       Or   ondansetron [facility]) injection 4 mg  4 mg Intravenous Q6H PRN [name], MD       oxyCODONE (Oxy IR/ROXICODONE) immediate release tablet 5-10 mg  5-10 mg Oral Q4H PRN [name], MD   10  mg at 08/28/21 2212   pantoprazole (PROTONIX) EC tablet 40 mg  40 mg Oral Daily [name], MD   40 mg at 08/28/21 1703   prazosin (MINIPRESS) capsule 1 mg  1 mg Oral QHS [name], MD   1 mg at 08/28/21 2254   QUEtiapine (SEROQUEL) tablet 400 mg  400 mg Oral QHS [name], MD   400 mg at 08/28/21 2254   rosuvastatin (CRESTOR) tablet 10 mg  10 mg Oral Daily [name], MD       simethicone [facility]) chewable tablet 40 mg  40 mg Oral Q6H PRN [name], MD       umeclidinium bromide (INCRUSE ELLIPTA) 62.5 MCG/ACT 1 puff  1 puff Inhalation Daily Skeet Simmer, RPH        OBJECTIVE: middle-aged [place] woman who appears older than stated age There were no vitals filed for this visit.     There is no height or weight on file to calculate BMI.    ECOG FS:2 - Symptomatic, <50% confined to bed  Sclerae unicteric, EOMs intact Wearing a mask No cervical or supraclavicular adenopathy Lungs no rales or rhonchi Heart regular rate and rhythm Abd soft, obese, nontender, positive bowel sounds MSK no focal spinal tenderness Neuro: nonfocal, well oriented, appropriate affect Breasts: The right breast is unremarkable.  The incision in the right axilla appears to be healing well, without erythema or dehiscence.  The left breast is imaged below.  It is firm, with restricted mobility, and has obvious cutaneous lesions, recently biopsied Skin: Careful inspection of the  skin of the chest front and back fine no subcutaneous lesions outside the left breast itself  Left breast 08/08/2021    LAB RESULTS:  CMP     Component Value Date/Time   NA 139 08/15/2021 1359   NA 138 03/10/2020 1534   NA 139 01/19/2015 1422   K 3.9 08/15/2021 1359   K 3.8 01/19/2015 1422   CL 106 08/15/2021 1359   CO2 24 08/15/2021 1359   CO2 23 01/19/2015 1422   GLUCOSE 77 08/15/2021 1359   GLUCOSE 93 01/19/2015 1422   BUN 8 08/15/2021 1359   BUN 8 03/10/2020 1534   BUN 8.6 01/19/2015 1422   CREATININE 0.95 08/15/2021 1359   CREATININE 1.1 01/19/2015 1422   CALCIUM 9.1 08/15/2021 1359   CALCIUM 9.3 01/19/2015 1422   PROT 7.4 08/15/2021 1359   PROT 7.2 02/11/2020 1626   PROT 7.3 01/19/2015 1422   ALBUMIN 4.0 08/15/2021 1359   ALBUMIN 4.4 02/11/2020 1626   ALBUMIN 3.9 01/19/2015 1422   AST 11 (L) 08/15/2021 1359   AST 14 01/19/2015 1422   ALT 6 08/15/2021 1359   ALT 13 01/19/2015 1422   ALKPHOS 62 08/15/2021 1359   ALKPHOS 82 01/19/2015 1422   BILITOT 0.4 08/15/2021 1359   BILITOT 0.35 01/19/2015 1422   GFRNONAA >60 08/15/2021 1359   GFRAA 76 03/10/2020 1534    I No results found for: SPEP  Lab Results  Component Value Date   WBC 5.2 08/15/2021   NEUTROABS 2.6 08/15/2021   HGB 10.1 (L) 08/15/2021   HCT 33.9 (L) 08/15/2021   MCV 76.7 (L) 08/15/2021   PLT 235 08/15/2021      Chemistry      Component Value Date/Time   NA 139 08/15/2021 1359   NA 138 03/10/2020 1534   NA 139 01/19/2015 1422   K 3.9 08/15/2021 1359   K 3.8 01/19/2015 1422   CL  106 08/15/2021 1359   CO2 24 08/15/2021 1359   CO2 23 01/19/2015 1422   BUN 8 08/15/2021 1359   BUN 8 03/10/2020 1534   BUN 8.6 01/19/2015 1422   CREATININE 0.95 08/15/2021 1359   CREATININE 1.1 01/19/2015 1422      Component Value Date/Time   CALCIUM 9.1 08/15/2021 1359   CALCIUM 9.3 01/19/2015 1422   ALKPHOS 62 08/15/2021 1359   ALKPHOS 82 01/19/2015 1422   AST 11 (L) 08/15/2021 1359   AST 14  01/19/2015 1422   ALT 6 08/15/2021 1359   ALT 13 01/19/2015 1422   BILITOT 0.4 08/15/2021 1359   BILITOT 0.35 01/19/2015 1422       Lab Results  Component Value Date   LABCA2 28 04/30/2014    No components found for: [id number]  No results for input(s): INR in the last 168 hours.  Urinalysis    Component Value Date/Time   COLORURINE YELLOW 03/07/2017 2111   APPEARANCEUR CLEAR 03/07/2017 2111   LABSPEC 1.016 03/07/2017 2111   PHURINE 7.0 03/07/2017 2111   GLUCOSEU NEGATIVE 03/07/2017 2111   HGBUR NEGATIVE 03/07/2017 2111   BILIRUBINUR negative 03/10/2020 1515   KETONESUR negative 03/10/2020 1515   [place] 03/07/2017 2111   PROTEINUR =30 (A) 03/10/2020 1515   PROTEINUR NEGATIVE 03/07/2017 2111   UROBILINOGEN 0.2 03/10/2020 1515   UROBILINOGEN 0.2 07/10/2014 1951   NITRITE Negative 03/10/2020 1515   NITRITE NEGATIVE 03/07/2017 2111   LEUKOCYTESUR Negative 03/10/2020 1515    STUDIES: No results found.   ASSESSMENT: 50 y.o. BRCA negative [place] woman status post left lumpectomy and sentinel lymph node sampling 05/03/2014 for a pT1c pN0, stage IA invasive ductal carcinoma, grade 2, estrogen receptor 83% positive, progesterone receptor 66% positive, with an MIB-1 of 14% and no HER-2 amplification.  (1) Oncotype score of 22 predicts a risk of outside the breast recurrence within 10 years of 13% if the patient's only systemic therapy is tamoxifen for 5 years.  (2) adjuvant chemotherapy with cyclophosphamide and docetaxel started 07/06/2014, patient tolerated chemotherapy very poorly and it was discontinued after one cycle.    (3) adjuvant radiation completed 10/21/2014 1) Left Breast / 50.4 Gy in 28 fractions 2) Left supraclavicular / 50.4 Gy in 28 fractions 3) Left Posterior axillary boost  / 9.52 Gy in 28 fractions 4) Left Breast Boost / 10 Gy in 5 fractions  (4) anastrozole started 01/19/2015-[name] informed me she never started this.   METASTATIC  DISEASE to skin: September 2022 (5) mammography 05/24/2021 shows fluid collection in the right axilla, no obvious malignancy within each breast but superficial skin lesions throughout the upper outer left breast.  (A) CT scan of the abdomen and pelvis with contrast 06/10/2021 shows small low-attenuation liver lesions which could be cysts but no definite evidence of metastatic disease.  (B) chest CT scan 07/14/2021 shows no evidence of metastatic disease  (C) CT head with and without contrast on 07/15/2021 shows no evidence of metastatic disease (D) biopsy left breast skin lesion 07/25/2021 shows metastatic carcinoma, estrogen and progesterone receptor positive, HER2 not amplified (1+)  (E) excision right axillary mass 07/25/2021 shows benign epidermoid cyst (E) CA 27-29 on 07/05/2021 is normal at 21.1  (6) iron deficiency anemia: On 07/05/2021 the ferritin was less than 4 and the iron saturation 3%; hemoglobin was 9.1 with an MCV of 71.3  (A) Venofer started on 07/18/2021, to be repeated x5   (B) GI evaluation 08/11/2021  (7) to start fulvestrant 08/29/2021  (  A) to start abemaciclib 50 mg twice daily starting 08/15/2021  (8) status post left mastectomy 08/28/2020  PLAN: Kimberly [name] has biopsy-proven disease recurrence involving the left breast.  We have not found evidence of disease outside of the left breast including careful palpation of the skin of the chest wall anteriorly and posteriorly.  Accordingly prophylactic left mastectomy is a consideration for local control, to be followed by adjuvant radiation.  I have discussed this with her surgeon Dr. Donne Hazel and we are obtaining a breast MRI ASAP to further clarify that option.  Systemically she will be treated with anastrozole for the antiestrogen and abemaciclib for the CDK 4 inhibitor.  Today we discussed the possible toxicities side effects and complications of these agents.  She will also meet with our clinical pharmacologist next week to  discuss these further.  She has a GI work-up in process to evaluate the reason for her iron deficiency and bright red blood per rectum.  Assuming all goes well she will meet me again 08/29/2021 with her next Xgeva dose.  She knows to call for any other issue that may develop before then.  Total encounter time 35 minutes.Sarajane Jews C. Romar Woodrick, MD 08/29/21 7:37 AM Medical Oncology and Hematology [facility] [place], [place] [address] Tel. [phone]    Fax. [phone]   I, Wilburn Mylar, am acting as scribe for Dr. Virgie Dad. Kimaya Whitlatch.  I, Lurline Del MD, have reviewed the above documentation for accuracy and completeness, and I agree with the above.    *Total Encounter Time as defined by the Centers for Medicare and Medicaid Services includes, in addition to the face-to-face time of a patient visit (documented in the note above) non-face-to-face time: obtaining and reviewing outside history, ordering and reviewing medications, tests or procedures, care coordination (communications with other health care professionals or caregivers) and documentation in the medical record.

## 2021-08-29 NOTE — Progress Notes (Signed)
Patient ID: Kimberly [name], female   DOB: Feb 22, 1971, 50 y.o.   MRN: [id number]  Copious amount of serosanguinous fluid draining from insertions site. MD paged.  Haydee Salter, RN'

## 2021-08-29 NOTE — Anesthesia Postprocedure Evaluation (Signed)
Anesthesia Post Note  Patient: Alisa Stjames  Procedure(s) Performed: LEFT TOTAL MASTECTOMY (Left: Breast)     Patient location during evaluation: PACU Anesthesia Type: General Level of consciousness: awake and alert Pain management: pain level controlled Vital Signs Assessment: post-procedure vital signs reviewed and stable Respiratory status: spontaneous breathing, nonlabored ventilation, respiratory function stable and patient connected to nasal cannula oxygen Cardiovascular status: blood pressure returned to baseline and stable Postop Assessment: no apparent nausea or vomiting Anesthetic complications: no   No notable events documented.  Last Vitals:  Vitals:   08/28/21 2359 08/29/21 0441  BP: (!) 145/73 108/63  Pulse: 74 62  Resp: 18 18  Temp: 36.9 C 37 C  SpO2: 99% 96%    Last Pain:  Vitals:   08/29/21 0600  TempSrc:   PainSc: 7                  Brayen Bunn S

## 2021-08-29 NOTE — Progress Notes (Signed)
Courtesy note  Kimberly [name] was scheduled to see [name] at our office today.  However, she remains in the hospital following left mastectomy.  I notified the patient and her family member that her appointment has been rescheduled to 09/12/2021.  I gave them the time of the appointment.  I also advised the patient and her family member that her appointments will print on her AVS when she is discharged from the hospital.   Future Appointments  Date Time Provider [place]  09/12/2021  1:00 PM [facility] [place] None  09/12/2021  1:30 PM Magrinat, Virgie Dad, MD [facility] None  09/12/2021  2:15 PM [facility] [place] [facility] None  10/10/2021  1:30 PM [facility] [place] None  10/10/2021  2:00 PM Ingoglia, John A, RPH-CPP [facility] None  10/10/2021  3:00 PM [facility] [place] FLUSH [facility] None  11/07/2021  2:00 PM [facility] [place] FLUSH [facility] None  11/20/2021  3:00 PM [facility] 1 [facility] [place]  12/19/2021  2:00 PM [facility] [place] FLUSH [facility] None  02/16/2022  2:00 PM [facility] [place] FLUSH [facility] None  03/13/2022  2:00 PM [facility] [place] [facility] None  04/25/2022  2:00 PM [facility] [place] FL[facility] None  06/06/2022  2:00 PM [facility] [place] [facility] None

## 2021-08-29 NOTE — Evaluation (Signed)
Physical Therapy Evaluation Patient Details Name: Kimberly [name] MRN: [id number] DOB: 1970/12/10 Today's Date: 08/29/2021  History of Present Illness  Patient is a 50 y/o female who presents s/p left total mastectomy 08/28/21 secondary to breast ca. PMH includes bipolar disorder, tobacco abuse, breast ca, COPD, DM, Covid 19, depression.  Clinical Impression  Patient presents with pain, decreased AROM/strength BUEs, impaired balance, impaired cognition and impaired mobility s/p above. Pt lives alone and reports being Mod I for ambulation/ADLs PTA with some difficulty since last surgery a few weeks ago. Today, pt requires Min A for bed mobility and Min guard assist for ambulation with 1 instance of Mod A due to LOB, but attributing this likely to the pain meds. Pt reports she will not have help at home, requesting [place] nurse/aide for support. Tolerated light stretches to cervical musculature/upper back for comfort as pt reporting tightness and neck locking up. Balance will likely improve without affects of pain meds. Encouraged walking to the bathroom daily. Will follow acutely to maximize independence and mobility prior to return home.     Recommendations for follow up therapy are one component of a multi-disciplinary discharge planning process, led by the attending physician.  Recommendations may be updated based on patient status, additional functional criteria and insurance authorization.  Follow Up Recommendations Home health PT    Assistance Recommended at Discharge Intermittent Supervision/Assistance  Functional Status Assessment Patient has had a recent decline in their functional status and demonstrates the ability to make significant improvements in function in a reasonable and predictable amount of time.  Equipment Recommendations  None recommended by PT    Recommendations for Other Services       Precautions / Restrictions Precautions Precautions: Fall Precaution Comments: JP  drain Restrictions Weight Bearing Restrictions: No      Mobility  Bed Mobility Overal bed mobility: Needs Assistance Bed Mobility: Rolling;Sidelying to Sit Rolling: Supervision Sidelying to sit: Min assist;HOB elevated       General bed mobility comments: Min A with trunk to get to EOB, cues to use momentum/direction of head to come into sitting.    Transfers Overall transfer level: Needs assistance Equipment used: None Transfers: Sit to/from Stand Sit to Stand: Min guard;Mod assist           General transfer comment: Min guard to stand; however pt with complete LOB posteriorly once up needing Mod A to correct.    Ambulation/Gait Ambulation/Gait assistance: Min guard Gait Distance (Feet): 100 Feet Assistive device: IV Pole Gait Pattern/deviations: Step-through pattern;Decreased stride length Gait velocity: decreased Gait velocity interpretation: 1.31 - 2.62 ft/sec, indicative of limited community ambulator   General Gait Details: Slow, mildly unsteady gait holding onto IV pole for support and LUE held against chest as position of comfort.  Stairs            Wheelchair Mobility    Modified Rankin (Stroke Patients Only)       Balance Overall balance assessment: Needs assistance Sitting-balance support: Feet supported;No upper extremity supported Sitting balance-Leahy Scale: Good     Standing balance support: During functional activity Standing balance-Leahy Scale: Fair Standing balance comment: Fair at times, but does better with UE support due to effects of pain meds during dynamic tasks.                             Pertinent Vitals/Pain Pain Assessment: 0-10 Pain Score: 7  Pain Location: BUEs/shoulder Pain Descriptors / Indicators: Sore;Aching Pain  Intervention(s): Monitored during session;Premedicated before session;Limited activity within patient's tolerance;Repositioned    Home Living Family/patient expects to be discharged to::  Private residence Living Arrangements: Alone Available Help at Discharge: Family;Available PRN/intermittently Type of Home: House Home Access: Stairs to enter Entrance Stairs-Rails: None Entrance Stairs-Number of Steps: 3   Home Layout: One level;Laundry or work area in [place]: Advice worker (2 wheels);Rollator (4 wheels)      Prior Function Prior Level of Function : Independent/Modified Independent             Mobility Comments: Uses RW as needed. ADLs Comments: Does own ADLs, does not drive. Does not do cooking/cleaning.     Hand Dominance   Dominant Hand: Right    Extremity/Trunk Assessment   Upper Extremity Assessment Upper Extremity Assessment: Defer to OT evaluation RUE Deficits / Details: Limited shoulder flexion to ~100degrees. RUE Coordination: decreased gross motor LUE Deficits / Details: Limited shoulder ROM due to pain. LUE Coordination: decreased gross motor    Lower Extremity Assessment Lower Extremity Assessment: Overall WFL for tasks assessed    Cervical / Trunk Assessment Cervical / Trunk Assessment: Other exceptions Cervical / Trunk Exceptions: s/p left mastectomy  Communication   Communication: No difficulties  Cognition Arousal/Alertness: Awake/alert;Lethargic Behavior During Therapy: WFL for tasks assessed/performed Overall Cognitive Status: Impaired/Different from baseline Area of Impairment: Safety/judgement;Problem solving;Attention                   Current Attention Level: Selective     Safety/Judgement: Decreased awareness of safety   Problem Solving: Slow processing;Requires verbal cues General Comments: Highly motivated. Poor balance likely from pain meds with no awareness.        General Comments General comments (skin integrity, edema, etc.): JP drain leaking at end of session. RN present and aware.    Exercises Other Exercises Other Exercises: Worked on light stretching of cervical  spine- SB, rotation, flexion.extension. Light PROM of LUE.   Assessment/Plan    PT Assessment Patient needs continued PT services  PT Problem List Decreased mobility;Decreased range of motion;Decreased safety awareness;Decreased balance;Decreased cognition;Pain;Decreased skin integrity       PT Treatment Interventions Therapeutic activities;Therapeutic exercise;Gait training;Stair training;Functional mobility training;Balance training;Patient/family education    PT Goals (Current goals can be found in the Care Plan section)  Acute Rehab PT Goals Patient Stated Goal: to get help at home PT Goal Formulation: With patient Time For Goal Achievement: 09/12/21 Potential to Achieve Goals: Good    Frequency Min 3X/week   Barriers to discharge Decreased caregiver support      Co-evaluation               AM-PAC PT "6 Clicks" Mobility  Outcome Measure Help needed turning from your back to your side while in a flat bed without using bedrails?: A Little Help needed moving from lying on your back to sitting on the side of a flat bed without using bedrails?: A Little Help needed moving to and from a bed to a chair (including a wheelchair)?: A Lot Help needed standing up from a chair using your arms (e.g., wheelchair or bedside chair)?: A Little Help needed to walk in hospital room?: A Lot Help needed climbing 3-5 steps with a railing? : A Little 6 Click Score: 16    End of Session   Activity Tolerance: Patient limited by pain;Patient tolerated treatment well Patient left: in bed;with call bell/phone within reach;with nursing/sitter in room (sitting EOB with RN and OT present) Nurse  Communication: Mobility status;Other (comment) (JP drain leaking) PT Visit Diagnosis: Pain;Difficulty in walking, not elsewhere classified (R26.2);Unsteadiness on feet (R26.81) Pain - Right/Left: Left Pain - part of body: Shoulder;Arm    Time: 2548-6282 PT Time Calculation (min) (ACUTE ONLY): 34  min   Charges:   PT Evaluation $PT Eval Moderate Complexity: 1 Mod PT Treatments $Gait Training: 8-22 mins        Marisa Severin, PT, DPT Acute Rehabilitation Services Pager [phone] Office [place] 08/29/2021, 12:51 PM

## 2021-08-29 NOTE — Care Management Obs Status (Signed)
[facility] NOTIFICATION   Patient Details  Name: Kimberly [name] MRN: [id number] Date of Birth: July 01, 1971   Medicare Observation Status Notification Given:  Yes    Joanne Chars, [place] 08/29/2021, 2:11 PM

## 2021-08-29 NOTE — TOC Initial Note (Signed)
Transition of Care [facility]) - Initial/Assessment Note    Patient Details  Name: Kimberly [name] MRN: [id number] Date of Birth: Feb 23, 1971  Transition of Care [facility]) CM/SW Contact:    Joanne Chars, LCSW Phone Number: 08/29/2021, 2:30 PM  Clinical Narrative:  CSW spoke with pt regarding recommendation for [facility].  Pt agreeable, choice document given, no agency preference.  Pt lives at home with her three teenage children, who are in school during the day but home afterwards.  Permission given to speak with pt children, two listed in epic.   PCP in place.  Current DME in home: shower chair and husband's old rollator, pt reports it has a broken hand brake.                  Expected Discharge Plan: [place] Barriers to Discharge: Continued Medical Work up   Patient Goals and CMS Choice Patient states their goals for this hospitalization and ongoing recovery are:: get back home CMS Medicare.gov Compare Post Acute Care list provided to:: Patient Choice offered to / list presented to : Patient  Expected Discharge Plan and Services Expected Discharge Plan: [place] Choice: [place] arrangements for the past 2 months: Single Family Home                                      Prior Living Arrangements/Services Living arrangements for the past 2 months: Single Family Home Lives with:: Minor Children (three teenage children) Patient language and need for interpreter reviewed:: Yes Do you feel safe going back to the place where you live?: Yes      Need for Family Participation in Patient Care: Yes (Comment) Care giver support system in place?: Yes (comment) Current home services: Other (comment) (none) Criminal Activity/Legal Involvement Pertinent to Current Situation/Hospitalization: No - Comment as needed  Activities of Daily Living      Permission Sought/Granted Permission sought to share information with :  Family Supports Permission granted to share information with : Yes, Verbal Permission Granted  Share Information with NAME: daughter [name], son [place] Bells  Permission granted to share info w AGENCY: HH        Emotional Assessment Appearance:: Appears older than stated age Attitude/Demeanor/Rapport: Engaged Affect (typically observed): Appropriate Orientation: : Oriented to Self, Oriented to Place, Oriented to  Time, Oriented to Situation Alcohol / Substance Use: Not Applicable Psych Involvement: No (comment)  Admission diagnosis:  S/P mastectomy, left [Z90.12] Patient Active Problem List   Diagnosis Date Noted   S/P mastectomy, left 08/28/2021   Goals of care, counseling/discussion 07/18/2021   Microcytic anemia 07/05/2021   Left corneal abrasion 06/30/2021   History of COVID-19 01/27/2021   Elevated LDL cholesterol level 12/30/2020   COPD exacerbation ([place]) 08/24/2020   Oligouria 03/10/2020   History of breast cancer 02/11/2020   Total body pain 01/13/2019   Postmenopausal bleeding 10/25/2017   Chronic pain of both knees 05/03/2017   COPD (chronic obstructive pulmonary disease) ([place]) 03/20/2017   Tachycardia    Mixed incontinence 09/29/2015   Genetic testing 08/26/2015   Malignant neoplasm of upper-outer quadrant of left breast in female, estrogen receptor positive ([place]) 05/07/2014   Recurrent breast cancer, left ([place]) 04/14/2014   Disorder of bladder 09/29/2008   LOW BACK PAIN, CHRONIC 09/29/2008   Morbid (severe) obesity due to excess calories ([place])  11/19/2007   ANEMIA, IRON DEFICIENCY, CHRONIC 11/14/2007   Mixed bipolar I disorder ([place]) 02/20/2007   Tobacco use disorder 02/20/2007   PCP:  Lurline Del, DO Pharmacy:   [facility], [place] [address] [place] AT [place] [address] Phone: [phone] Fax: [phone]  [facility] ([place], [place] [facility] [address] [place] [address] [place] [address] Phone: [phone] Fax: [place]. [place] [address] Phone: [phone] Fax: [phone]  [place], [address] [place] [address] Phone: [phone] Fax: [phone]  [facility] ([facility]) - [place], [place] [address] [place] [address] Phone: [phone] Fax: [phone]     Social Determinants of Health (SDOH) Interventions    Readmission Risk Interventions No flowsheet data found.

## 2021-08-30 DIAGNOSIS — F319 Bipolar disorder, unspecified: Secondary | ICD-10-CM | POA: Diagnosis present

## 2021-08-30 DIAGNOSIS — Z923 Personal history of irradiation: Secondary | ICD-10-CM | POA: Diagnosis not present

## 2021-08-30 DIAGNOSIS — Z79899 Other long term (current) drug therapy: Secondary | ICD-10-CM | POA: Diagnosis not present

## 2021-08-30 DIAGNOSIS — Z6841 Body Mass Index (BMI) 40.0 and over, adult: Secondary | ICD-10-CM | POA: Diagnosis not present

## 2021-08-30 DIAGNOSIS — C50412 Malignant neoplasm of upper-outer quadrant of left female breast: Secondary | ICD-10-CM | POA: Diagnosis present

## 2021-08-30 DIAGNOSIS — Z803 Family history of malignant neoplasm of breast: Secondary | ICD-10-CM | POA: Diagnosis not present

## 2021-08-30 DIAGNOSIS — J449 Chronic obstructive pulmonary disease, unspecified: Secondary | ICD-10-CM | POA: Diagnosis present

## 2021-08-30 DIAGNOSIS — Z170 Estrogen receptor positive status [ER+]: Secondary | ICD-10-CM | POA: Diagnosis not present

## 2021-08-30 DIAGNOSIS — Z8249 Family history of ischemic heart disease and other diseases of the circulatory system: Secondary | ICD-10-CM | POA: Diagnosis not present

## 2021-08-30 DIAGNOSIS — E119 Type 2 diabetes mellitus without complications: Secondary | ICD-10-CM | POA: Diagnosis present

## 2021-08-30 DIAGNOSIS — C773 Secondary and unspecified malignant neoplasm of axilla and upper limb lymph nodes: Secondary | ICD-10-CM | POA: Diagnosis present

## 2021-08-30 DIAGNOSIS — C792 Secondary malignant neoplasm of skin: Secondary | ICD-10-CM | POA: Diagnosis present

## 2021-08-30 MED ORDER — METHOCARBAMOL 750 MG PO TABS
750.0000 mg | ORAL_TABLET | Freq: Four times a day (QID) | ORAL | Status: DC
  Administered 2021-08-30 – 2021-08-31 (×5): 750 mg via ORAL
  Filled 2021-08-30 (×5): qty 1

## 2021-08-30 MED ORDER — NAPROXEN 250 MG PO TABS
500.0000 mg | ORAL_TABLET | Freq: Two times a day (BID) | ORAL | Status: DC
  Administered 2021-08-30 – 2021-08-31 (×2): 500 mg via ORAL
  Filled 2021-08-30 (×2): qty 2

## 2021-08-30 NOTE — Progress Notes (Signed)
Occupational Therapy Treatment Patient Details Name: Kimberly [name] MRN: [id number] DOB: 1971/01/28 Today's Date: 08/30/2021   History of present illness Patient is a 50 y/o female who presents s/p left total mastectomy 08/28/21 secondary to breast ca. PMH includes bipolar disorder, tobacco abuse, breast ca, COPD, DM, Covid 19, depression.   OT comments  Pt progressing towards acute OT goals. Focus of session was post-mastectomy exercises. Also provided education on energy conservation for managing ADLs. Daughter present. Full session details below. D/c recommendations remain appropriate.    Recommendations for follow up therapy are one component of a multi-disciplinary discharge planning process, led by the attending physician.  Recommendations may be updated based on patient status, additional functional criteria and insurance authorization.    Follow Up Recommendations  Home health OT ([place] aide, [facility] RN)    Assistance Recommended at Discharge Intermittent Supervision/Assistance  Equipment Recommendations  None recommended by OT    Recommendations for Other Services      Precautions / Restrictions Precautions Precautions: Fall Precaution Comments: JP drain Restrictions Weight Bearing Restrictions: No       Mobility Bed Mobility Overal bed mobility: Needs Assistance Bed Mobility: Rolling;Sit to Supine Rolling: Supervision     Sit to supine: Supervision;HOB elevated   General bed mobility comments: required assist to get adjusted once positioned in semi-reclined.    Transfers Overall transfer level: Needs assistance Equipment used: None Transfers: Sit to/from Stand Sit to Stand: Min guard           General transfer comment: pt slightly impulsive when standing and tends to made quick, sudden movements     Balance Overall balance assessment: Needs assistance Sitting-balance support: Feet supported;No upper extremity supported Sitting balance-Leahy Scale:  Good Sitting balance - Comments: sitting EOB upon therapist's arrival   Standing balance support: No upper extremity supported;During functional activity Standing balance-Leahy Scale: Fair Standing balance comment: able to maintain balance with min guard and no LOB without UE support, but does require UE support when reaching outside BOS                           ADL either performed or assessed with clinical judgement   ADL                                         General ADL Comments: Pt reports she completed shower early today with extra time and rest breaks incorporated. Provided education on energy conservation for ADL management.    Extremity/Trunk Assessment Upper Extremity Assessment Upper Extremity Assessment: RUE deficits/detail;LUE deficits/detail RUE Deficits / Details: Limited shoulder flexion to ~100degrees. RUE Coordination: decreased gross motor LUE Deficits / Details: Limited shoulder ROM due to pain. LUE Coordination: decreased gross motor   Lower Extremity Assessment Lower Extremity Assessment: Defer to PT evaluation        Vision       Perception     Praxis      Cognition Arousal/Alertness: Awake/alert Behavior During Therapy: WFL for tasks assessed/performed Overall Cognitive Status: Impaired/Different from baseline Area of Impairment: Safety/judgement                         Safety/Judgement: Decreased awareness of safety     General Comments: hyperverbal          Exercises Exercises: Other exercises Other Exercises Other Exercises: post-mastectomy  exercises completed at bed level 3-5 reps. Provided printout. Daughter present and involved in session.   Shoulder Instructions       General Comments Notifed NT of minimal leaking coming from drain at end of session.    Pertinent Vitals/ Pain       Pain Assessment: 0-10 Pain Score: 10-Worst pain ever Pain Location: BUEs/shoulder Pain Descriptors /  Indicators: Sore;Aching;Discomfort;Operative site guarding Pain Intervention(s): Monitored during session;Repositioned;Premedicated before session;Limited activity within patient's tolerance;Relaxation  Home Living                                          Prior Functioning/Environment              Frequency  Min 2X/week        Progress Toward Goals  OT Goals(current goals can now be found in the care plan section)  Progress towards OT goals: Progressing toward goals  Acute Rehab OT Goals Patient Stated Goal: home OT Goal Formulation: With patient/family Time For Goal Achievement: 09/12/21 Potential to Achieve Goals: Good ADL Goals Pt Will Perform Upper Body Dressing: with modified independence;sitting Pt Will Perform Lower Body Dressing: with modified independence;sit to/from stand Pt Will Transfer to Toilet: with modified independence;ambulating;regular height toilet Pt/caregiver will Perform Home Exercise Program: Increased ROM;Independently;With written HEP provided;Both right and left upper extremity  Plan Discharge plan remains appropriate    Co-evaluation                 AM-PAC OT "6 Clicks" Daily Activity     Outcome Measure   Help from another person eating meals?: A Little Help from another person taking care of personal grooming?: A Little Help from another person toileting, which includes using toliet, bedpan, or urinal?: A Little Help from another person bathing (including washing, rinsing, drying)?: A Little Help from another person to put on and taking off regular upper body clothing?: A Little Help from another person to put on and taking off regular lower body clothing?: A Little 6 Click Score: 18    End of Session    OT Visit Diagnosis: Unsteadiness on feet (R26.81);Other abnormalities of gait and mobility (R26.89);Muscle weakness (generalized) (M62.81);Pain Pain - Right/Left: Left   Activity Tolerance Patient tolerated  treatment well   Patient Left in bed;with call bell/phone within reach;with family/visitor present   Nurse Communication          Time: 7092-9574 OT Time Calculation (min): 10 min  Charges: OT General Charges $OT Visit: 1 Visit OT Treatments $Therapeutic Exercise: 8-22 mins  Tyrone Schimke, OT Acute Rehabilitation Services Pager: [phone] Office: [phone]   Hortencia Pilar 08/30/2021, 1:29 PM

## 2021-08-30 NOTE — TOC Initial Note (Signed)
Transition of Care [facility]) - Initial/Assessment Note    Patient Details  Name: Kimberly [name] MRN: [id number] Date of Birth: Jan 31, 1971  [facility]) CM/SW Contact:    Marilu Favre, RN Phone Number: 08/30/2021, 12:54 PM  Clinical Narrative:                  Patient from home wit three children 49 years old and younger. She has two older children who have moved out on their own.    Discussed HHPT coming 2 to 3 times a week for a hour at a time. Patient in agreement.   Patient states her and her daughter have already been taught drain care.    Cory with [facility] accepted referral for HHPT.   Patient is calling her Research scientist (life sciences) , patient states she has a policy that will cover aides. Expected Discharge Plan: [place] Barriers to Discharge: Continued Medical Work up   Patient Goals and CMS Choice Patient states their goals for this hospitalization and ongoing recovery are:: to return to home CMS Medicare.gov Compare Post Acute Care list provided to:: Patient Choice offered to / list presented to : Patient  Expected Discharge Plan and Services Expected Discharge Plan: [place]   Discharge Planning Services: CM Consult Post Acute Care Choice: [place] arrangements for the past 2 months: Single Family Home                   DME Agency: NA       HH Arranged: PT HH Agency: [place] Date [facility] Agency Contacted: 08/30/21 Time HH Agency Contacted: 1253 Representative spoke with at [place]: Tommi Rumps  Prior Living Arrangements/Services Living arrangements for the past 2 months: [place] Lives with::  (three chidren 98 and younger) Patient language and need for interpreter reviewed:: Yes Do you feel safe going back to the place where you live?: Yes      Need for Family Participation in Patient Care: Yes (Comment) Care giver support system in place?: Yes (comment) Current home services:  Other (comment) (none) Criminal Activity/Legal Involvement Pertinent to Current Situation/Hospitalization: No - Comment as needed  Activities of Daily Living      Permission Sought/Granted Permission sought to share information with : Family Supports Permission granted to share information with : Yes, Verbal Permission Granted  Share Information with NAME: daughter at bedside  Permission granted to share info w AGENCY: [facility]        Emotional Assessment Appearance:: Appears stated age Attitude/Demeanor/Rapport: Engaged Affect (typically observed): Accepting Orientation: : Oriented to Self, Oriented to Place, Oriented to  Time, Oriented to Situation Alcohol / Substance Use: Not Applicable Psych Involvement: No (comment)  Admission diagnosis:  S/P mastectomy, left [Z90.12] Patient Active Problem List   Diagnosis Date Noted   S/P mastectomy, left 08/28/2021   Goals of care, counseling/discussion 07/18/2021   Microcytic anemia 07/05/2021   Left corneal abrasion 06/30/2021   History of COVID-19 01/27/2021   Elevated LDL cholesterol level 12/30/2020   COPD exacerbation ([place]) 08/24/2020   Oligouria 03/10/2020   History of breast cancer 02/11/2020   Total body pain 01/13/2019   Postmenopausal bleeding 10/25/2017   Chronic pain of both knees 05/03/2017   COPD (chronic obstructive pulmonary disease) ([place]) 03/20/2017   Tachycardia    Mixed incontinence 09/29/2015   Genetic testing 08/26/2015   Malignant neoplasm of upper-outer quadrant of left breast in female, estrogen receptor positive ([place])  05/07/2014   Recurrent breast cancer, left ([place]) 04/14/2014   Disorder of bladder 09/29/2008   LOW BACK PAIN, CHRONIC 09/29/2008   Morbid (severe) obesity due to excess calories ([place]) 11/19/2007   ANEMIA, IRON DEFICIENCY, CHRONIC 11/14/2007   Mixed bipolar I disorder ([place]) 02/20/2007   Tobacco use disorder 02/20/2007   PCP:  Lurline Del, DO Pharmacy:   [place], [place] - [place] [address] [place] [address] Phone: [phone] Fax: [phone]  [facility] ([place], [place] [facility] [place] [address] Phone: [phone] Fax: [place]. [place] [address] Phone: [phone] Fax: [phone]  [place], [place] [address] [place] [address] Phone: [phone] Fax: [phone]  [facility] ([facility]) - [place], [place] [address] [place] [address] Phone: [phone] Fax: [phone]     Social Determinants of Health (SDOH) Interventions    Readmission Risk Interventions No flowsheet data found.

## 2021-08-30 NOTE — Progress Notes (Signed)
2 Days Post-Op   Subjective/Chief Complaint: Pain fair control, tol diet   Objective: Vital signs in last 24 hours: Temp:  [98 F (36.7 C)-98.4 F (36.9 C)] 98.2 F (36.8 C) (11/09 0738) Pulse Rate:  [63-81] 81 (11/09 0738) Resp:  [16-20] 16 (11/09 0738) BP: (129-150)/(65-95) 139/71 (11/09 0738) SpO2:  [98 %-100 %] 98 % (11/09 0738)    Intake/Output from previous day: 11/08 0701 - 11/09 0700 In: 300 [P.O.:300] Out: 70 [Drains:70] Intake/Output this shift: No intake/output data recorded.  General uncomfortable Pulm effort normal Left mastectomy without hematoma, flaps viable, drain serosang    Studies/Results: No results found.  Anti-infectives: Anti-infectives (From admission, onward)    Start     Dose/Rate Route Frequency Ordered Stop   08/28/21 0845  ceFAZolin (ANCEF) IVPB 3g/100 mL premix        3 g 200 mL/hr over 30 Minutes Intravenous On call to O.R. 08/28/21 0833 08/28/21 1020       Assessment/Plan: POD 2 left mastectomy -adjust pain control today again -oob -saline lock, carb modified diet -case mgt to see for recs from pt/ot -home meds -hopefully home tomorrow -path pending  [name] 08/30/2021

## 2021-08-30 NOTE — Progress Notes (Signed)
Physical Therapy Treatment Patient Details Name: Kimberly [name] MRN: [id number] DOB: 1971-08-30 Today's Date: 08/30/2021   History of Present Illness Patient is a 50 y/o female who presents s/p left total mastectomy 08/28/21 secondary to breast ca. PMH includes bipolar disorder, tobacco abuse, breast ca, COPD, DM, Covid 19, depression.    PT Comments    Received pt sitting EOB with daughter present, pt reported feeling fatigued from taking a shower with assist from daughter. Pt performed all transfers without AD and min guard with no LOB noted but does demonstrate unsteadiness especially when reaching outside BOS for items. Pt demonstrates improvements in endurance with ambulation but is limited by pain. Educated pt on breathing strategies and repositioning to manage pain levels. Pt transferred back to bed at end of session with supervision. Acute PT to cont to follow.    Recommendations for follow up therapy are one component of a multi-disciplinary discharge planning process, led by the attending physician.  Recommendations may be updated based on patient status, additional functional criteria and insurance authorization.  Follow Up Recommendations  Home health PT     Assistance Recommended at Discharge Intermittent Supervision/Assistance  Equipment Recommendations  None recommended by PT    Recommendations for Other Services       Precautions / Restrictions Precautions Precautions: Fall Precaution Comments: JP drain Restrictions Weight Bearing Restrictions: No     Mobility  Bed Mobility Overal bed mobility: Needs Assistance Bed Mobility: Rolling;Sit to Supine Rolling: Supervision     Sit to supine: Supervision;HOB elevated   General bed mobility comments: required assist to get adjusted once positioned in semi-reclined.    Transfers Overall transfer level: Needs assistance Equipment used: None Transfers: Sit to/from Stand Sit to Stand: Min guard            General transfer comment: pt slightly impulsive when standing and tends to made quick, sudden movements    Ambulation/Gait Ambulation/Gait assistance: Min guard Gait Distance (Feet): 200 Feet Assistive device: None Gait Pattern/deviations: Step-through pattern;Decreased stride length;Decreased step length - right;Decreased step length - left;Wide base of support Gait velocity: decreased Gait velocity interpretation: 1.31 - 2.62 ft/sec, indicative of limited community ambulator   General Gait Details: pt easily distracted by other people in hallway requring cues for focus/attention   Stairs             Wheelchair Mobility    Modified Rankin (Stroke Patients Only)       Balance Overall balance assessment: Needs assistance Sitting-balance support: Feet supported;No upper extremity supported Sitting balance-Leahy Scale: Good Sitting balance - Comments: sitting EOB upon therapist's arrival   Standing balance support: No upper extremity supported;During functional activity Standing balance-Leahy Scale: Fair Standing balance comment: able to maintain balance with min guard and no LOB without UE support, but does require UE support when reaching outside BOS                            Cognition Arousal/Alertness: Awake/alert Behavior During Therapy: WFL for tasks assessed/performed Overall Cognitive Status: Impaired/Different from baseline Area of Impairment: Safety/judgement                         Safety/Judgement: Decreased awareness of safety     General Comments: pt very hyperverbal during session requring cues to slow down (speech and movement)        Exercises      General Comments General comments (skin integrity,  edema, etc.): Notifed NT of minimal leaking coming from drain at end of session.      Pertinent Vitals/Pain Pain Assessment: 0-10 Pain Score: 10-Worst pain ever Pain Location: BUEs/shoulder Pain Descriptors / Indicators:  Sore;Aching;Discomfort;Operative site guarding Pain Intervention(s): Monitored during session;Repositioned;Premedicated before session;Limited activity within patient's tolerance;Relaxation    Home Living                          Prior Function            PT Goals (current goals can now be found in the care plan section) Acute Rehab PT Goals Patient Stated Goal: to get help at home PT Goal Formulation: With patient Time For Goal Achievement: 09/12/21 Potential to Achieve Goals: Good Progress towards PT goals: Progressing toward goals    Frequency    Min 3X/week      PT Plan Current plan remains appropriate    Co-evaluation              AM-PAC PT "6 Clicks" Mobility   Outcome Measure  Help needed turning from your back to your side while in a flat bed without using bedrails?: A Little Help needed moving from lying on your back to sitting on the side of a flat bed without using bedrails?: A Little Help needed moving to and from a bed to a chair (including a wheelchair)?: A Little Help needed standing up from a chair using your arms (e.g., wheelchair or bedside chair)?: A Little Help needed to walk in hospital room?: A Little Help needed climbing 3-5 steps with a railing? : A Lot 6 Click Score: 17    End of Session Equipment Utilized During Treatment: Gait belt Activity Tolerance: Patient tolerated treatment well;Patient limited by pain Patient left: in bed;with call bell/phone within reach;with family/visitor present Nurse Communication: Mobility status PT Visit Diagnosis: Pain;Unsteadiness on feet (R26.81);Other abnormalities of gait and mobility (R26.89);Muscle weakness (generalized) (M62.81) Pain - Right/Left: Left Pain - part of body: Shoulder;Arm     Time: 4680-3212 PT Time Calculation (min) (ACUTE ONLY): 21 min  Charges:  $Therapeutic Activity: 8-22 mins                     Kimberly [name] PT, DPT  Kimberly [name] 08/30/2021, 10:47 AM

## 2021-08-30 NOTE — Progress Notes (Signed)
Mobility Specialist Progress Note:   08/30/21 1600  Mobility  Activity Ambulated in hall  Level of Assistance Independent  Assistive Device None  Distance Ambulated (ft) 570 ft  Mobility Ambulated independently in hallway  Mobility Response Tolerated well  Mobility performed by Mobility specialist  Bed Position Chair  $Mobility charge 1 Mobility   Pt asx during ambulation. Eager for d/c.  Nelta Numbers Mobility Specialist  Phone [phone]

## 2021-08-31 MED ORDER — METHOCARBAMOL 750 MG PO TABS: 750.0000 mg | ORAL_TABLET | Freq: Four times a day (QID) | ORAL | 0 refills | Status: DC

## 2021-08-31 MED ORDER — OXYCODONE HCL 5 MG PO TABS
5.0000 mg | ORAL_TABLET | ORAL | 0 refills | Status: DC | PRN
Start: 2021-08-31 — End: 2021-09-26

## 2021-08-31 MED ORDER — NAPROXEN 500 MG PO TABS
500.0000 mg | ORAL_TABLET | Freq: Two times a day (BID) | ORAL | 0 refills | Status: DC
Start: 2021-08-31 — End: 2021-11-06

## 2021-08-31 MED ORDER — GABAPENTIN 100 MG PO CAPS: 100.0000 mg | ORAL_CAPSULE | Freq: Every day | ORAL | 0 refills | Status: DC

## 2021-08-31 NOTE — Progress Notes (Signed)
Mobility Specialist Progress Note:   08/31/21 1020  Mobility  Activity Ambulated in hall  Level of Assistance Independent  Assistive Device None  Distance Ambulated (ft) 100 ft  Mobility Ambulated independently in hallway  Mobility Response Tolerated well  Mobility performed by Mobility specialist  $Mobility charge 1 Mobility   Pt asx during ambulation. Eager for d/c.  Nelta Numbers Mobility Specialist  Phone [phone]

## 2021-08-31 NOTE — Plan of Care (Signed)

## 2021-08-31 NOTE — Discharge Summary (Signed)
Physician Discharge Summary  Patient ID: Kimberly [name] MRN: [id number] DOB/AGE: [birth year] 50 y.o.  Admit date: 08/28/2021 Discharge date: 08/31/2021  Admission Diagnoses: Breast cancer Bipolar disorder Depression Diabetes mellitus  Discharge Diagnoses:  Active Problems:   S/P mastectomy, left   Discharged Condition: good  Hospital Course: 50 yof with prior breast conservation therapy and with skin biopsy that was positive for invasive cancer and numerous other skin lesions.  I proceeded with left mastectomy.  She has had a lot of pain postop and required therapy to start mobilizing her. She wasn't able to be discharged due to this and pain control. Has remained without hematoma, flaps viable.   Consults: None  Significant Diagnostic Studies: none  Treatments: surgery: left mastectomy  Discharge Exam: Blood pressure (!) 149/93, pulse (!) 108, temperature 98.4 F (36.9 C), temperature source Oral, resp. rate 16, height 5\' 9"  (1.753 m), weight 132 kg, SpO2 99 %. Left mastectomy without hematoma, viable drain serous  Disposition: Discharge disposition: 01-Home or Self Care        Allergies as of 08/31/2021   No Known Allergies      Medication List     TAKE these medications    abemaciclib 50 MG tablet Commonly known as: VERZENIO Take 1 tablet (50 mg total) by mouth 2 (two) times daily. Swallow tablets whole. Do not chew, crush, or split tablets before swallowing.   AeroChamber Plus inhaler Use with inhaler   albuterol 108 (90 Base) MCG/ACT inhaler Commonly known as: VENTOLIN HFA USE 2 INHALATIONS BY MOUTH  EVERY 6 HOURS AS NEEDED FOR WHEEZING OR SHORTNESS OF  BREATH   AMBULATORY NON FORMULARY MEDICATION Medication Name: Diltiazem 2%/ Lidocaine 5% Using your index finger, apply a small amount of medication inside the rectum up to your first knuckle/joint 4 daily x 8 weeks.   buPROPion 150 MG 24 hr tablet Commonly known as: WELLBUTRIN XL Take 150 mg by  mouth daily.   doxepin 50 MG capsule Commonly known as: SINEQUAN Take 50 mg by mouth at bedtime.   FASLODEX IM Inject 2 Doses into the muscle every 14 (fourteen) days.   FLUoxetine 40 MG capsule Commonly known as: PROZAC Take 40 mg by mouth at bedtime.   gabapentin 100 MG capsule Commonly known as: NEURONTIN Take 1 capsule (100 mg total) by mouth at bedtime.   hydrocortisone 25 MG suppository Commonly known as: ANUSOL-HC Use every other night for 2-weeks until prescription is complete   lamoTRIgine 200 MG tablet Commonly known as: LAMICTAL Take 200 mg by mouth at bedtime.   lidocaine-prilocaine cream Commonly known as: EMLA Apply 1 application topically as needed.   loperamide 2 MG capsule Commonly known as: IMODIUM Take 2 mg by mouth as needed for diarrhea or loose stools. Take 2 tablets (4 mg) with first sign of diarrhea, followed by 1 tablet (2 mg) with future occurrences of diarrhea. Take up to 8 tablets (16 mg) in 24 hours.   methocarbamol 750 MG tablet Commonly known as: ROBAXIN Take 1 tablet (750 mg total) by mouth 4 (four) times daily.   montelukast 10 MG tablet Commonly known as: SINGULAIR Take 1 tablet (10 mg total) by mouth at bedtime.   naproxen 500 MG tablet Commonly known as: NAPROSYN Take 1 tablet (500 mg total) by mouth 2 (two) times daily with a meal.   ondansetron 8 MG tablet Commonly known as: ZOFRAN Take 1 tablet (8 mg total) by mouth every 8 (eight) hours as needed for nausea or vomiting.  oxyCODONE 5 MG immediate release tablet Commonly known as: Oxy IR/ROXICODONE Take 1-2 tablets (5-10 mg total) by mouth every 4 (four) hours as needed for moderate pain.   pantoprazole 40 MG tablet Commonly known as: Protonix Take 1 tablet (40 mg total) by mouth daily.   prazosin 1 MG capsule Commonly known as: MINIPRESS Take 1 mg by mouth at bedtime.   prochlorperazine 5 MG tablet Commonly known as: COMPAZINE Take 1 tablet (5 mg total) by mouth  every 6 (six) hours as needed for nausea or vomiting. May take 10 mg (2 tablets) if 5 mg not sufficient. May cause drowsiness.   QUEtiapine 400 MG tablet Commonly known as: SEROQUEL Take 400 mg by mouth at bedtime.   rosuvastatin 10 MG tablet Commonly known as: Crestor Take 1 tablet (10 mg total) by mouth daily.   Spiriva Respimat 1.25 MCG/ACT Aers Generic drug: Tiotropium Bromide Monohydrate Inhale 2 puffs into the lungs daily. What changed:  when to take this reasons to take this        Follow-up Information     [name], MD Follow up in 2 week(s).   Specialty: General Surgery Contact information: [address] [place] [address] [phone]         [facility], [facility] Follow up.   Specialty: Home Health Services Contact information: [address] [place] [address] [phone]                 Signed: [name] 08/31/2021, 1:28 PM

## 2021-09-05 DIAGNOSIS — Z7951 Long term (current) use of inhaled steroids: Secondary | ICD-10-CM | POA: Diagnosis not present

## 2021-09-05 DIAGNOSIS — Z483 Aftercare following surgery for neoplasm: Secondary | ICD-10-CM | POA: Diagnosis not present

## 2021-09-05 DIAGNOSIS — D630 Anemia in neoplastic disease: Secondary | ICD-10-CM | POA: Diagnosis not present

## 2021-09-05 DIAGNOSIS — E119 Type 2 diabetes mellitus without complications: Secondary | ICD-10-CM | POA: Diagnosis not present

## 2021-09-05 DIAGNOSIS — J449 Chronic obstructive pulmonary disease, unspecified: Secondary | ICD-10-CM | POA: Diagnosis not present

## 2021-09-05 DIAGNOSIS — Z9181 History of falling: Secondary | ICD-10-CM | POA: Diagnosis not present

## 2021-09-05 DIAGNOSIS — C50412 Malignant neoplasm of upper-outer quadrant of left female breast: Secondary | ICD-10-CM | POA: Diagnosis not present

## 2021-09-06 NOTE — Telephone Encounter (Signed)
Follow up scheduled for 10-31-2021

## 2021-09-07 DIAGNOSIS — C50412 Malignant neoplasm of upper-outer quadrant of left female breast: Secondary | ICD-10-CM | POA: Diagnosis not present

## 2021-09-07 DIAGNOSIS — Z483 Aftercare following surgery for neoplasm: Secondary | ICD-10-CM | POA: Diagnosis not present

## 2021-09-07 DIAGNOSIS — D630 Anemia in neoplastic disease: Secondary | ICD-10-CM | POA: Diagnosis not present

## 2021-09-07 DIAGNOSIS — E119 Type 2 diabetes mellitus without complications: Secondary | ICD-10-CM | POA: Diagnosis not present

## 2021-09-07 DIAGNOSIS — Z7951 Long term (current) use of inhaled steroids: Secondary | ICD-10-CM | POA: Diagnosis not present

## 2021-09-07 DIAGNOSIS — Z9181 History of falling: Secondary | ICD-10-CM | POA: Diagnosis not present

## 2021-09-07 DIAGNOSIS — J449 Chronic obstructive pulmonary disease, unspecified: Secondary | ICD-10-CM | POA: Diagnosis not present

## 2021-09-07 LAB — SURGICAL PATHOLOGY

## 2021-09-11 NOTE — Progress Notes (Addendum)
[name]  [phone]) [phone] Fax:(336) [phone]    ID: Kimberly [name] DOB: [birth year]  MR#: [id number]  AVW#:[id number]  Patient Care Team: Lurline Del, DO as PCP - General (Family Medicine) Magdelyn Roebuck, Virgie Dad, MD as Consulting Physician (Oncology) [name], MD as Attending Physician (Radiation Oncology) [name], MD as Consulting Physician (General Surgery) Causey, Charlestine Massed, NP as Nurse Practitioner (Hematology and Oncology) [name], RPH-CPP (Pharmacist) 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  However she did not show  To review, she presented with a right axillary mass, which underwent excision 07/25/2021.  This proved to be a benign epidermoid cyst. However Punch biopsy of the left breast skin showed metastatic carcinoma, estrogen and progesterone receptor both 95% positive, moderate to strong, HER2 negative by immunohistochemistry (1+).  The accession number is [id number].  She underwent staging head CT on 07/14/2021, which was negative. Restaging chest CT the same day was also negative for metastatic disease.  Recall she had a CT of the abdomen and pelvis 06/18/2021 which again showed no evidence of metastatic disease.  There was no evidence of bone lesions on any of these scans.  On 08/11/2021 she underwent EGD to evaluate the possible cause of her iron deficiency anemia.  With pathology showing chronic gastritis with intestinal metaplasia in the stomach but no evidence of dysplasia or malignancy and no H. pylori noted ([address] [id number]).  She began fulvestrant on 08/15/2021 and abemaciclib on 08/29/2021.  On 08/28/2021 she  underwent left mastectomy. Pathology from the procedure [id number]) showed: invasive lobular carcinoma, grade 2, spanning a fibrotic area measuring about 9 cm; ductal carcinoma in situ, low grade; lobular carcinoma in situ; carcinoma involves dermis of skin and nipple; all margins negative.  Of the three biopsied lymph nodes, one showed metastatic carcinoma (1/3).   REVIEW OF SYSTEMS: [place]    COVID 19 VACCINATION STATUS:    BREAST CANCER HISTORY: From the original intake note:  The patient had bilateral screening mammography with tomography 03/19/2014. This was the patient's first ever mammography. Showed a possible mass in the left breast. Left diagnostic mammography and ultrasonography 04/01/2014 showed an irregular mass in the upper outer quadrant of the left breast with a possible satellite 1 cm anterior to it. On physical exam there was a firm palpable mass at the 1:00 position of the left breast 8 cm from the nipple. There was no palpable left axillary adenopathy. Ultrasound showed an irregular hypoechoic mass in the area in question measuring 1.4 cm. There was a 4 mm nodule located anterior to this. Ultrasound of the left axilla was benign.  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 The second mass was negative for malignancy. This was felt to be concordant.  On 04/08/2014 the patient underwent bilateral breast MRI. This showed a 9 mm enhancing mass in the subareolar area of the right breast in in the left breast, the previously noted mass measuring 1.3 cm. There were no morphologically  abnormal lymph nodes  The right breast finding was followed up on 04/19/2014 with  ultrasound which showed an intraductal soft tissue mass in the inferior subareolar worsening of the right breast measuring 8 mm. This was biopsied 04/19/2014 and showed ([id number]) ectatic duct, with no evidence of malignancy. Surgical excision was recommended.  Accordingly on 05/03/2014 the patient underwent right lumpectomy, showing an intraductal papilloma, with known malignancy identified. Left lumpectomy on the same day showed an invasive ductal carcinoma measuring 1.5 cm, with one of the 2 sentinel lymph nodes positive for carcinoma. There was no extracapsular extension. Margins were clear and ample. HER-2 was repeated and was again negative.  The patient's case was discussed in the multidisciplinary breast cancer conference 05/12/2014. An Oncotype had been previously requested and this showed a recurrent score of 22, in the intermediate range. The patient qualifies for the S1007 study and this will be discussed with her. Otherwise the standard recommendation would be chemotherapy followed by radiation followed by anti-estrogens.  The patient's subsequent history is as detailed below.   PAST MEDICAL HISTORY: Past Medical History:  Diagnosis Date   Acute cholecystitis 04/17/2019   Anemia    Anxiety    Arthritis    Bipolar 1 disorder ([place])    Blood transfusion without reported diagnosis 2007   post bleeding childbirth   Cancer ([place])    left breast cancer 2015   COPD (chronic obstructive pulmonary disease) ([place])    pt reports she is not on oxygen   COVID-19 virus infection 06/07/2019   Depression    Diabetes mellitus without complication ([place])    gestational   Hand injury, right, initial encounter 03/19/2019   Heart murmur    as a child-not adult-no cardiac work up   History of radiation therapy 08/31/14-10/21/14   left breast/ left supraclavicular 50.4 Gy 28 fx, lef tposterior axillary boost 9.52 Gy 28 fx, left rbeast boost/ 10 Gy 5 fx   Personal history of chemotherapy     Personal history of radiation therapy    Rash and nonspecific skin eruption 12/27/2020    PAST SURGICAL HISTORY: Past Surgical History:  Procedure Laterality Date   BREAST BIOPSY Left 04/01/14   BREAST BIOPSY Right 04/19/14   BREAST LUMPECTOMY WITH AXILLARY LYMPH NODE DISSECTION Bilateral 05/03/14   CHOLECYSTECTOMY N/A 04/18/2019   Procedure: LAPAROSCOPIC CHOLECYSTECTOMY WITH INTRAOPERATIVE CHOLANGIOGRAM;  Surgeon: Jovita Kussmaul, MD;  Location: [facility];  Service: General;  Laterality: N/A;   DILATION AND CURETTAGE OF UTERUS     after childbirth   MASS EXCISION Right 07/25/2021   Procedure: RIGHT AXILLARY MASS EXCISION;  Surgeon: [name], MD;  Location: [facility];  Service: General;  Laterality: Right;   MINOR BREAST BIOPSY Left 07/25/2021   Procedure: SKIN PUNCH BIOPSY LEFT BREAST;  Surgeon: [name], MD;  Location: [facility];  Service: General;  Laterality: Left;   MULTIPLE TOOTH EXTRACTIONS     only 5 left   PORTACATH PLACEMENT Right 06/18/2014   Procedure: INSERTION PORT-A-CATH;  Surgeon: [name], MD;  Location: [facility];  Service: General;  Laterality: Right;   TONSILLECTOMY AND ADENOIDECTOMY     TOTAL MASTECTOMY Left 08/28/2021   Procedure: LEFT TOTAL MASTECTOMY;  Surgeon: [name], MD;  Location: [facility];  Service: General;  Laterality: Left;  58 MINUTES- POST PER KELLY AND SHE WILL PUT IN ROOM    FAMILY HISTORY Family History  Problem Relation Age of Onset   Heart disease Father    Cancer  Father        Prostate   Colon cancer Maternal Grandfather    Colon cancer Paternal Grandmother    Stomach cancer Neg Hx    Esophageal cancer Neg Hx    Rectal cancer Neg Hx   The patient's father is currently living at age 27. The patient's mother was murdered at age 73 by the patient's mother's sister. The patient has 2 brothers, one sister. The patient's father hase prostate cancer, diagnosed at age 39. There is no history of breast or ovarian  cancer in the family.   GYNECOLOGIC HISTORY:  No LMP recorded. (Menstrual status: Perimenopausal). Menarche age 34, first live birth age 76, the patient is [place] P5. The patient has not had a period in the last 2 months but tells me she has tested for pregnancy x2 and she is not pregnant.   SOCIAL HISTORY:  she is disabled secondary to a nervous breakdown. She is widowed-- her husband Luiz Ochoa passed away several years ago.  She lives at home with her children, ages 79, 17, and 81.  Her son was recently named valedictorian of his high school class.    ADVANCED DIRECTIVES:  not in place    HEALTH MAINTENANCE: Social History   Tobacco Use   Smoking status: Every Day    Packs/day: 1.00    Years: 34.00    Pack years: 34.00    Types: Cigarettes   Smokeless tobacco: Never  Vaping Use   Vaping Use: Never used  Substance Use Topics   Alcohol use: No   Drug use: No     Colonoscopy:  PAP:  Bone density:  Lipid panel:  No Known Allergies  Current Outpatient Medications  Medication Sig Dispense Refill   abemaciclib (VERZENIO) 50 MG tablet Take 1 tablet (50 mg total) by mouth 2 (two) times daily. Swallow tablets whole. Do not chew, crush, or split tablets before swallowing. 60 tablet 6   albuterol (VENTOLIN HFA) 108 (90 Base) MCG/ACT inhaler USE 2 INHALATIONS BY MOUTH  EVERY 6 HOURS AS NEEDED FOR WHEEZING OR SHORTNESS OF  BREATH 34 g 3   AMBULATORY NON FORMULARY MEDICATION Medication Name: Diltiazem 2%/ Lidocaine 5% Using your index finger, apply a small amount of medication inside the rectum up to your first knuckle/joint 4 daily x 8 weeks. 38 g 1   buPROPion (WELLBUTRIN XL) 150 MG 24 hr tablet Take 150 mg by mouth daily.     doxepin (SINEQUAN) 50 MG capsule Take 50 mg by mouth at bedtime.     FLUoxetine (PROZAC) 40 MG capsule Take 40 mg by mouth at bedtime.     Fulvestrant (FASLODEX IM) Inject 2 Doses into the muscle every 14 (fourteen) days.     gabapentin (NEURONTIN) 100 MG capsule  Take 1 capsule (100 mg total) by mouth at bedtime. 30 capsule 0   hydrocortisone (ANUSOL-HC) 25 MG suppository Use every other night for 2-weeks until prescription is complete 7 suppository 0   lamoTRIgine (LAMICTAL) 200 MG tablet Take 200 mg by mouth at bedtime.     lidocaine-prilocaine (EMLA) cream Apply 1 application topically as needed. (Patient not taking: No sig reported) 30 g 0   loperamide (IMODIUM) 2 MG capsule Take 2 mg by mouth as needed for diarrhea or loose stools. Take 2 tablets (4 mg) with first sign of diarrhea, followed by 1 tablet (2 mg) with future occurrences of diarrhea. Take up to 8 tablets (16 mg) in 24 hours.     methocarbamol (ROBAXIN)  750 MG tablet Take 1 tablet (750 mg total) by mouth 4 (four) times daily. 60 tablet 0   montelukast (SINGULAIR) 10 MG tablet Take 1 tablet (10 mg total) by mouth at bedtime. 30 tablet 3   naproxen (NAPROSYN) 500 MG tablet Take 1 tablet (500 mg total) by mouth 2 (two) times daily with a meal. 60 tablet 0   ondansetron (ZOFRAN) 8 MG tablet Take 1 tablet (8 mg total) by mouth every 8 (eight) hours as needed for nausea or vomiting. 20 tablet 0   oxyCODONE (OXY IR/ROXICODONE) 5 MG immediate release tablet Take 1-2 tablets (5-10 mg total) by mouth every 4 (four) hours as needed for moderate pain. 20 tablet 0   pantoprazole (PROTONIX) 40 MG tablet Take 1 tablet (40 mg total) by mouth daily. 30 tablet 0   prazosin (MINIPRESS) 1 MG capsule Take 1 mg by mouth at bedtime.     prochlorperazine (COMPAZINE) 5 MG tablet Take 1 tablet (5 mg total) by mouth every 6 (six) hours as needed for nausea or vomiting. May take 10 mg (2 tablets) if 5 mg not sufficient. May cause drowsiness. 30 tablet 0   QUEtiapine (SEROQUEL) 400 MG tablet Take 400 mg by mouth at bedtime.      rosuvastatin (CRESTOR) 10 MG tablet Take 1 tablet (10 mg total) by mouth daily. 90 tablet 1   Spacer/Aero-Holding Chambers (AEROCHAMBER PLUS) inhaler Use with inhaler 1 each 2   Tiotropium Bromide  Monohydrate (SPIRIVA RESPIMAT) 1.25 MCG/ACT AERS Inhale 2 puffs into the lungs daily. (Patient taking differently: Inhale 2 puffs into the lungs daily as needed (shortness of breath).) 4 each 0   No current facility-administered medications for this visit.    OBJECTIVE: middle-aged [place] woman who appears older than stated age There were no vitals filed for this visit.     There is no height or weight on file to calculate BMI.    ECOG FS:2 - Symptomatic, <50% confined to bed    Left breast 08/08/2021    LAB RESULTS:  CMP     Component Value Date/Time   NA 139 08/15/2021 1359   NA 138 03/10/2020 1534   NA 139 01/19/2015 1422   K 3.9 08/15/2021 1359   K 3.8 01/19/2015 1422   CL 106 08/15/2021 1359   CO2 24 08/15/2021 1359   CO2 23 01/19/2015 1422   GLUCOSE 77 08/15/2021 1359   GLUCOSE 93 01/19/2015 1422   BUN 8 08/15/2021 1359   BUN 8 03/10/2020 1534   BUN 8.6 01/19/2015 1422   CREATININE 0.95 08/15/2021 1359   CREATININE 1.1 01/19/2015 1422   CALCIUM 9.1 08/15/2021 1359   CALCIUM 9.3 01/19/2015 1422   PROT 7.4 08/15/2021 1359   PROT 7.2 02/11/2020 1626   PROT 7.3 01/19/2015 1422   ALBUMIN 4.0 08/15/2021 1359   ALBUMIN 4.4 02/11/2020 1626   ALBUMIN 3.9 01/19/2015 1422   AST 11 (L) 08/15/2021 1359   AST 14 01/19/2015 1422   ALT 6 08/15/2021 1359   ALT 13 01/19/2015 1422   ALKPHOS 62 08/15/2021 1359   ALKPHOS 82 01/19/2015 1422   BILITOT 0.4 08/15/2021 1359   BILITOT 0.35 01/19/2015 1422   GFRNONAA >60 08/15/2021 1359   GFRAA 76 03/10/2020 1534    I No results found for: SPEP  Lab Results  Component Value Date   WBC 5.2 08/15/2021   NEUTROABS 2.6 08/15/2021   HGB 10.1 (L) 08/15/2021   HCT 33.9 (L) 08/15/2021   MCV 76.7 (  L) 08/15/2021   PLT 235 08/15/2021      Chemistry      Component Value Date/Time   NA 139 08/15/2021 1359   NA 138 03/10/2020 1534   NA 139 01/19/2015 1422   K 3.9 08/15/2021 1359   K 3.8 01/19/2015 1422   CL 106 08/15/2021  1359   CO2 24 08/15/2021 1359   CO2 23 01/19/2015 1422   BUN 8 08/15/2021 1359   BUN 8 03/10/2020 1534   BUN 8.6 01/19/2015 1422   CREATININE 0.95 08/15/2021 1359   CREATININE 1.1 01/19/2015 1422      Component Value Date/Time   CALCIUM 9.1 08/15/2021 1359   CALCIUM 9.3 01/19/2015 1422   ALKPHOS 62 08/15/2021 1359   ALKPHOS 82 01/19/2015 1422   AST 11 (L) 08/15/2021 1359   AST 14 01/19/2015 1422   ALT 6 08/15/2021 1359   ALT 13 01/19/2015 1422   BILITOT 0.4 08/15/2021 1359   BILITOT 0.35 01/19/2015 1422       Lab Results  Component Value Date   LABCA2 28 04/30/2014    No components found for: [id number]  No results for input(s): INR in the last 168 hours.  Urinalysis    Component Value Date/Time   COLORURINE YELLOW 03/07/2017 2111   APPEARANCEUR CLEAR 03/07/2017 2111   LABSPEC 1.016 03/07/2017 2111   PHURINE 7.0 03/07/2017 2111   GLUCOSEU NEGATIVE 03/07/2017 2111   HGBUR NEGATIVE 03/07/2017 2111   BILIRUBINUR negative 03/10/2020 1515   KETONESUR negative 03/10/2020 1515   [place] 03/07/2017 2111   PROTEINUR =30 (A) 03/10/2020 1515   PROTEINUR NEGATIVE 03/07/2017 2111   UROBILINOGEN 0.2 03/10/2020 1515   UROBILINOGEN 0.2 07/10/2014 1951   NITRITE Negative 03/10/2020 1515   NITRITE NEGATIVE 03/07/2017 2111   LEUKOCYTESUR Negative 03/10/2020 1515    STUDIES: No results found.   ASSESSMENT: 50 y.o. BRCA negative [place] woman status post left lumpectomy and sentinel lymph node sampling 05/03/2014 for a pT1c pN0, stage IA invasive ductal carcinoma, grade 2, estrogen receptor 83% positive, progesterone receptor 66% positive, with an MIB-1 of 14% and no HER-2 amplification.  (1) Oncotype score of 22 predicts a risk of outside the breast recurrence within 10 years of 13% if the patient's only systemic therapy is tamoxifen for 5 years.  (2) adjuvant chemotherapy with cyclophosphamide and docetaxel started 07/06/2014, patient tolerated chemotherapy very  poorly and it was discontinued after one cycle.    (3) adjuvant radiation completed 10/21/2014 1) Left Breast / 50.4 Gy in 28 fractions 2) Left supraclavicular / 50.4 Gy in 28 fractions 3) Left Posterior axillary boost  / 9.52 Gy in 28 fractions 4) Left Breast Boost / 10 Gy in 5 fractions  (4) anastrozole started 01/19/2015-[name] informed me she never started this.   METASTATIC DISEASE to skin: September 2022 (5) mammography 05/24/2021 shows fluid collection in the right axilla, no obvious malignancy within each breast but superficial skin lesions throughout the upper outer left breast.  (A) CT scan of the abdomen and pelvis with contrast 06/10/2021 shows small low-attenuation liver lesions which could be cysts but no definite evidence of metastatic disease.  (B) chest CT scan 07/14/2021 shows no evidence of metastatic disease  (C) CT head with and without contrast on 07/15/2021 shows no evidence of metastatic disease (D) biopsy left breast skin lesion 07/25/2021 shows metastatic carcinoma, estrogen and progesterone receptor positive, HER2 not amplified (1+)  (E) excision right axillary mass 07/25/2021 shows benign epidermoid cyst (E) CA 27-29 on  07/05/2021 is normal at 21.1  (6) iron deficiency anemia: On 07/05/2021 the ferritin was less than 4 and the iron saturation 3%; hemoglobin was 9.1 with an MCV of 71.3  (A) Venofer started on 07/18/2021, to be repeated x5   (B) GI evaluation 08/11/2021  (7) to start fulvestrant 09/13/2021  (A) to start abemaciclib 50 mg twice daily starting 09/14/2019  (8) status post left mastectomy 08/28/2021 for a pT3 pNX invasive lobular carcinoma, grade 2, with negative margins.  (9) adjuvant radiation pending   PLAN: Geniyah did not show for her 09/12/2021 visit.  I called her at home.  She tells me she forgot.  We will try to give her the Faslodex tomorrow and if possible have her meet with our oncology pharmacist to discuss the  abemaciclib.  Radiation oncology has reviewed her situation and they are willing to consider adjuvant radiation for her which I think is appropriate to optimize local control.   Virgie Dad. Yaziel Brandon, MD 09/12/21 5:40 PM Medical Oncology and Hematology [facility] [place], [place] [address] Tel. [phone]    Fax. [phone]   I, Wilburn Mylar, am acting as scribe for Dr. Virgie Dad. Blade Scheff.  I, Lurline Del MD, have reviewed the above documentation for accuracy and completeness, and I agree with the above.   *Total Encounter Time as defined by the Centers for Medicare and Medicaid Services includes, in addition to the face-to-face time of a patient visit (documented in the note above) non-face-to-face time: obtaining and reviewing outside history, ordering and reviewing medications, tests or procedures, care coordination (communications with other health care professionals or caregivers) and documentation in the medical record.

## 2021-09-12 ENCOUNTER — Inpatient Hospital Stay: Payer: Medicare Other

## 2021-09-12 ENCOUNTER — Other Ambulatory Visit: Payer: Self-pay | Admitting: Oncology

## 2021-09-12 ENCOUNTER — Inpatient Hospital Stay: Payer: Medicare Other | Attending: Oncology

## 2021-09-12 ENCOUNTER — Inpatient Hospital Stay (HOSPITAL_BASED_OUTPATIENT_CLINIC_OR_DEPARTMENT_OTHER): Payer: Medicare Other | Admitting: Oncology

## 2021-09-12 ENCOUNTER — Other Ambulatory Visit: Payer: Medicare Other

## 2021-09-12 ENCOUNTER — Ambulatory Visit: Payer: Medicare Other | Admitting: Adult Health

## 2021-09-12 ENCOUNTER — Ambulatory Visit: Payer: Medicare Other

## 2021-09-12 DIAGNOSIS — C50912 Malignant neoplasm of unspecified site of left female breast: Secondary | ICD-10-CM

## 2021-09-12 NOTE — Progress Notes (Signed)
Kimberly [name] was supposed to see me today 09/12/2021 and also received fulvestrant, discussed abemaciclib, and discussed medication but she forgot.  I called her at home.  We will try to fit her for a shot tomorrow 09/13/2021.  I make a separate appointment for her to see Dr. Isidore Moos.

## 2021-09-17 ENCOUNTER — Emergency Department (HOSPITAL_COMMUNITY)
Admission: EM | Admit: 2021-09-17 | Discharge: 2021-09-17 | Disposition: A | Discharge status: Home / Self Care | Payer: Medicare Other | Attending: Emergency Medicine | Admitting: Emergency Medicine

## 2021-09-17 ENCOUNTER — Encounter (HOSPITAL_COMMUNITY): Payer: Self-pay

## 2021-09-17 ENCOUNTER — Emergency Department (HOSPITAL_COMMUNITY): Payer: Medicare Other

## 2021-09-17 DIAGNOSIS — Z79899 Other long term (current) drug therapy: Secondary | ICD-10-CM | POA: Diagnosis not present

## 2021-09-17 DIAGNOSIS — J441 Chronic obstructive pulmonary disease with (acute) exacerbation: Secondary | ICD-10-CM | POA: Diagnosis not present

## 2021-09-17 DIAGNOSIS — K644 Residual hemorrhoidal skin tags: Secondary | ICD-10-CM | POA: Insufficient documentation

## 2021-09-17 DIAGNOSIS — G8918 Other acute postprocedural pain: Secondary | ICD-10-CM | POA: Insufficient documentation

## 2021-09-17 DIAGNOSIS — E119 Type 2 diabetes mellitus without complications: Secondary | ICD-10-CM | POA: Insufficient documentation

## 2021-09-17 DIAGNOSIS — K625 Hemorrhage of anus and rectum: Secondary | ICD-10-CM

## 2021-09-17 DIAGNOSIS — Z8616 Personal history of COVID-19: Secondary | ICD-10-CM | POA: Diagnosis not present

## 2021-09-17 DIAGNOSIS — R509 Fever, unspecified: Secondary | ICD-10-CM | POA: Insufficient documentation

## 2021-09-17 DIAGNOSIS — I959 Hypotension, unspecified: Secondary | ICD-10-CM | POA: Diagnosis not present

## 2021-09-17 DIAGNOSIS — Z853 Personal history of malignant neoplasm of breast: Secondary | ICD-10-CM | POA: Diagnosis not present

## 2021-09-17 DIAGNOSIS — I10 Essential (primary) hypertension: Secondary | ICD-10-CM | POA: Diagnosis not present

## 2021-09-17 DIAGNOSIS — R61 Generalized hyperhidrosis: Secondary | ICD-10-CM | POA: Diagnosis not present

## 2021-09-17 DIAGNOSIS — Z7951 Long term (current) use of inhaled steroids: Secondary | ICD-10-CM | POA: Diagnosis not present

## 2021-09-17 DIAGNOSIS — R0689 Other abnormalities of breathing: Secondary | ICD-10-CM | POA: Diagnosis not present

## 2021-09-17 DIAGNOSIS — Z9012 Acquired absence of left breast and nipple: Secondary | ICD-10-CM | POA: Diagnosis not present

## 2021-09-17 DIAGNOSIS — F17210 Nicotine dependence, cigarettes, uncomplicated: Secondary | ICD-10-CM | POA: Diagnosis not present

## 2021-09-17 DIAGNOSIS — Z20822 Contact with and (suspected) exposure to covid-19: Secondary | ICD-10-CM | POA: Diagnosis not present

## 2021-09-17 DIAGNOSIS — Z5189 Encounter for other specified aftercare: Secondary | ICD-10-CM

## 2021-09-17 LAB — COMPREHENSIVE METABOLIC PANEL
ALT: 10 U/L (ref 0–44)
AST: 16 U/L (ref 15–41)
Albumin: 3.8 g/dL (ref 3.5–5.0)
Alkaline Phosphatase: 52 U/L (ref 38–126)
Anion gap: 6 (ref 5–15)
BUN: 12 mg/dL (ref 6–20)
CO2: 23 mmol/L (ref 22–32)
Calcium: 8.6 mg/dL — ABNORMAL LOW (ref 8.9–10.3)
Chloride: 104 mmol/L (ref 98–111)
Creatinine, Ser: 1.2 mg/dL — ABNORMAL HIGH (ref 0.44–1.00)
GFR, Estimated: 55 mL/min — ABNORMAL LOW (ref >60)
Glucose, Bld: 96 mg/dL (ref 70–99)
Potassium: 3.6 mmol/L (ref 3.5–5.1)
Sodium: 133 mmol/L — ABNORMAL LOW (ref 135–145)
Total Bilirubin: 0.6 mg/dL (ref 0.3–1.2)
Total Protein: 6.9 g/dL (ref 6.5–8.1)

## 2021-09-17 LAB — CBC WITH DIFFERENTIAL/PLATELET
Abs Immature Granulocytes: 0.08 10*3/uL — ABNORMAL HIGH (ref 0.00–0.07)
Basophils Absolute: 0 10*3/uL (ref 0.0–0.1)
Basophils Relative: 0 %
Eosinophils Absolute: 0 10*3/uL (ref 0.0–0.5)
Eosinophils Relative: 0 %
HCT: 29.7 % — ABNORMAL LOW (ref 36.0–46.0)
Hemoglobin: 9.1 g/dL — ABNORMAL LOW (ref 12.0–15.0)
Immature Granulocytes: 1 %
Lymphocytes Relative: 19 %
Lymphs Abs: 2.4 10*3/uL (ref 0.7–4.0)
MCH: 25.2 pg — ABNORMAL LOW (ref 26.0–34.0)
MCHC: 30.6 g/dL (ref 30.0–36.0)
MCV: 82.3 fL (ref 80.0–100.0)
Monocytes Absolute: 0.7 10*3/uL (ref 0.1–1.0)
Monocytes Relative: 6 %
Neutro Abs: 9.5 10*3/uL — ABNORMAL HIGH (ref 1.7–7.7)
Neutrophils Relative %: 74 %
Platelet Morphology: NORMAL
Platelets: 193 10*3/uL (ref 150–400)
RBC: 3.61 MIL/uL — ABNORMAL LOW (ref 3.87–5.11)
RDW: 25.3 % — ABNORMAL HIGH (ref 11.5–15.5)
WBC: 12.7 10*3/uL — ABNORMAL HIGH (ref 4.0–10.5)
nRBC: 0 % (ref 0.0–0.2)

## 2021-09-17 LAB — RESP PANEL BY RT-PCR (FLU A&B, COVID) ARPGX2
Influenza A by PCR: NEGATIVE
Influenza B by PCR: NEGATIVE
SARS Coronavirus 2 by RT PCR: NEGATIVE

## 2021-09-17 LAB — POC OCCULT BLOOD, ED: Fecal Occult Bld: NEGATIVE

## 2021-09-17 MED ORDER — SODIUM CHLORIDE 0.9 % IV BOLUS
1000.0000 mL | Freq: Once | INTRAVENOUS | Status: AC
  Administered 2021-09-17: 06:00:00 1000 mL via INTRAVENOUS

## 2021-09-17 MED ORDER — OXYCODONE-ACETAMINOPHEN 5-325 MG PO TABS
1.0000 | ORAL_TABLET | Freq: Four times a day (QID) | ORAL | 0 refills | Status: DC | PRN
Start: 2021-09-17 — End: 2021-09-26

## 2021-09-17 MED ORDER — MORPHINE SULFATE (PF) 4 MG/ML IV SOLN
4.0000 mg | Freq: Once | INTRAVENOUS | Status: AC
  Administered 2021-09-17: 06:00:00 4 mg via INTRAVENOUS
  Filled 2021-09-17: qty 1

## 2021-09-17 NOTE — ED Provider Notes (Signed)
[facility] DEPT Provider Note   CSN: [id number] Arrival date & time: 09/17/21  0501     History Chief Complaint  Patient presents with   Post-op Problem   Fever    Kimberly [name] is a 50 y.o. female.  Patient is a 50 year old female with past medical history of breast cancer with recent mastectomy performed by Dr. Donne Hazel.  Patient also has history of COPD, anemia, bipolar disorder.  She presents today with complaints of pain at her surgical incision and foul-smelling drainage coming from her percutaneous drain.  She describes intermittent fevers at home and pain that is worsening.  She describes running out of her pain medicine 4 days ago.  She also reports to me that she is having recurrent maroon-colored stools.  This has been an ongoing issue for which she tells me she has had an endoscopy and colonoscopy approximately 1 month ago, both of which were unremarkable for source of bleeding.  Patient was brought here by EMS.  She received fentanyl in route.  The history is provided by the patient.      Past Medical History:  Diagnosis Date   Acute cholecystitis 04/17/2019   Anemia    Anxiety    Arthritis    Bipolar 1 disorder ([place])    Blood transfusion without reported diagnosis 2007   post bleeding childbirth   Cancer ([place])    left breast cancer 2015   COPD (chronic obstructive pulmonary disease) ([place])    pt reports she is not on oxygen   COVID-19 virus infection 06/07/2019   Depression    Diabetes mellitus without complication ([place])    gestational   Hand injury, right, initial encounter 03/19/2019   Heart murmur    as a child-not adult-no cardiac work up   History of radiation therapy 08/31/14-10/21/14   left breast/ left supraclavicular 50.4 Gy 28 fx, lef tposterior axillary boost 9.52 Gy 28 fx, left rbeast boost/ 10 Gy 5 fx   Personal history of chemotherapy    Personal history of radiation therapy    Rash and nonspecific skin  eruption 12/27/2020    Patient Active Problem List   Diagnosis Date Noted   S/P mastectomy, left 08/28/2021   Goals of care, counseling/discussion 07/18/2021   Microcytic anemia 07/05/2021   Left corneal abrasion 06/30/2021   History of COVID-19 01/27/2021   Elevated LDL cholesterol level 12/30/2020   COPD exacerbation ([place]) 08/24/2020   Oligouria 03/10/2020   History of breast cancer 02/11/2020   Total body pain 01/13/2019   Postmenopausal bleeding 10/25/2017   Chronic pain of both knees 05/03/2017   COPD (chronic obstructive pulmonary disease) ([place]) 03/20/2017   Tachycardia    Mixed incontinence 09/29/2015   Genetic testing 08/26/2015   Malignant neoplasm of upper-outer quadrant of left breast in female, estrogen receptor positive ([place]) 05/07/2014   Recurrent breast cancer, left ([place]) 04/14/2014   Disorder of bladder 09/29/2008   LOW BACK PAIN, CHRONIC 09/29/2008   Morbid (severe) obesity due to excess calories ([place]) 11/19/2007   ANEMIA, IRON DEFICIENCY, CHRONIC 11/14/2007   Mixed bipolar I disorder ([place]) 02/20/2007   Tobacco use disorder 02/20/2007    Past Surgical History:  Procedure Laterality Date   BREAST BIOPSY Left 04/01/14   BREAST BIOPSY Right 04/19/14   BREAST LUMPECTOMY WITH AXILLARY LYMPH NODE DISSECTION Bilateral 05/03/14   CHOLECYSTECTOMY N/A 04/18/2019   Procedure: LAPAROSCOPIC CHOLECYSTECTOMY WITH INTRAOPERATIVE CHOLANGIOGRAM;  Surgeon: Jovita Kussmaul, MD;  Location: [facility];  Service: General;  Laterality: N/A;   DILATION AND CURETTAGE OF UTERUS     after childbirth   MASS EXCISION Right 07/25/2021   Procedure: RIGHT AXILLARY MASS EXCISION;  Surgeon: [name], MD;  Location: [facility];  Service: General;  Laterality: Right;   MINOR BREAST BIOPSY Left 07/25/2021   Procedure: SKIN PUNCH BIOPSY LEFT BREAST;  Surgeon: [name], MD;  Location: [facility];  Service: General;  Laterality: Left;   MULTIPLE TOOTH EXTRACTIONS     only 5 left   PORTACATH  PLACEMENT Right 06/18/2014   Procedure: INSERTION PORT-A-CATH;  Surgeon: [name], MD;  Location: [facility];  Service: General;  Laterality: Right;   TONSILLECTOMY AND ADENOIDECTOMY     TOTAL MASTECTOMY Left 08/28/2021   Procedure: LEFT TOTAL MASTECTOMY;  Surgeon: [name], MD;  Location: [facility];  Service: General;  Laterality: Left;  14 MINUTES- POST PER KELLY AND SHE WILL PUT IN ROOM     OB History   No obstetric history on file.     Family History  Problem Relation Age of Onset   Heart disease Father    Cancer Father        Prostate   Colon cancer Maternal Grandfather    Colon cancer Paternal Grandmother    Stomach cancer Neg Hx    Esophageal cancer Neg Hx    Rectal cancer Neg Hx     Social History   Tobacco Use   Smoking status: Every Day    Packs/day: 1.00    Years: 34.00    Pack years: 34.00    Types: Cigarettes   Smokeless tobacco: Never  Vaping Use   Vaping Use: Never used  Substance Use Topics   Alcohol use: No   Drug use: No    Home Medications Prior to Admission medications   Medication Sig Start Date End Date Taking? Authorizing Provider  abemaciclib (VERZENIO) 50 MG tablet Take 1 tablet (50 mg total) by mouth 2 (two) times daily. Swallow tablets whole. Do not chew, crush, or split tablets before swallowing. 08/11/21  Yes Magrinat, Virgie Dad, MD  acetaminophen (TYLENOL) 650 MG CR tablet Take 650 mg by mouth every 8 (eight) hours as needed for pain or fever.   Yes [provider]  albuterol (VENTOLIN HFA) 108 (90 Base) MCG/ACT inhaler USE 2 INHALATIONS BY MOUTH  EVERY 6 HOURS AS NEEDED FOR WHEEZING OR SHORTNESS OF  BREATH Patient taking differently: 2 puffs every 6 (six) hours as needed for wheezing or shortness of breath. 08/25/21  Yes Welborn, Ryan, DO  anastrozole (ARIMIDEX) 1 MG tablet Take 1 mg by mouth daily. 08/21/21  Yes [provider]  buPROPion (WELLBUTRIN XL) 150 MG 24 hr tablet Take 150 mg by mouth  daily.   Yes [provider]  FLUoxetine (PROZAC) 40 MG capsule Take 40 mg by mouth at bedtime.   Yes [provider]  gabapentin (NEURONTIN) 100 MG capsule Take 1 capsule (100 mg total) by mouth at bedtime. 08/31/21  Yes [name], MD  lamoTRIgine (LAMICTAL) 200 MG tablet Take 200 mg by mouth at bedtime.   Yes [provider]  lidocaine-prilocaine (EMLA) cream Apply 1 application topically as needed. 07/21/21  Yes Magrinat, Virgie Dad, MD  methocarbamol (ROBAXIN) 750 MG tablet Take 1 tablet (750 mg total) by mouth 4 (four) times daily. 08/31/21  Yes [name], MD  montelukast (SINGULAIR) 10 MG tablet Take 1 tablet (10 mg total) by mouth at bedtime. 01/25/21  Yes Fenton Foy,  NP  pantoprazole (PROTONIX) 40 MG tablet Take 1 tablet (40 mg total) by mouth daily. 06/10/21  Yes Milton Ferguson, MD  prazosin (MINIPRESS) 1 MG capsule Take 1 mg by mouth at bedtime. 03/29/21  Yes [provider]  prochlorperazine (COMPAZINE) 5 MG tablet Take 1 tablet (5 mg total) by mouth every 6 (six) hours as needed for nausea or vomiting. May take 10 mg (2 tablets) if 5 mg not sufficient. May cause drowsiness. 08/15/21  Yes Magrinat, Virgie Dad, MD  QUEtiapine (SEROQUEL) 400 MG tablet Take 400 mg by mouth at bedtime.  11/29/19  Yes [provider]  rosuvastatin (CRESTOR) 10 MG tablet Take 1 tablet (10 mg total) by mouth daily. 06/08/21  Yes Gifford Shave, MD  Tiotropium Bromide Monohydrate (SPIRIVA RESPIMAT) 1.25 MCG/ACT AERS Inhale 2 puffs into the lungs daily. 01/25/21 09/17/22 Yes Fenton Foy, NP  AMBULATORY NON FORMULARY MEDICATION Medication Name: Diltiazem 2%/ Lidocaine 5% Using your index finger, apply a small amount of medication inside the rectum up to your first knuckle/joint 4 daily x 8 weeks. Patient not taking: Reported on 09/17/2021 08/07/21   Esterwood, Amy S, [name]  doxepin (SINEQUAN) 50 MG capsule Take 50 mg by mouth at bedtime. Patient not taking:  Reported on 09/17/2021 05/25/21   [provider]  Fulvestrant (FASLODEX IM) Inject 2 Doses into the muscle every 14 (fourteen) days. Every other Tuesday    [provider]  hydrocortisone (ANUSOL-HC) 25 MG suppository Use every other night for 2-weeks until prescription is complete Patient not taking: Reported on 09/17/2021 08/11/21   Mansouraty, Telford Nab., MD  loperamide (IMODIUM) 2 MG capsule Take 2 mg by mouth as needed for diarrhea or loose stools. Take 2 tablets (4 mg) with first sign of diarrhea, followed by 1 tablet (2 mg) with future occurrences of diarrhea. Take up to 8 tablets (16 mg) in 24 hours. Patient not taking: Reported on 09/17/2021    [provider]  naproxen (NAPROSYN) 500 MG tablet Take 1 tablet (500 mg total) by mouth 2 (two) times daily with a meal. Patient not taking: Reported on 09/17/2021 08/31/21   [name], MD  ondansetron (ZOFRAN) 8 MG tablet Take 1 tablet (8 mg total) by mouth every 8 (eight) hours as needed for nausea or vomiting. Patient not taking: Reported on 09/17/2021 08/15/21   Magrinat, Virgie Dad, MD  oxyCODONE (OXY IR/ROXICODONE) 5 MG immediate release tablet Take 1-2 tablets (5-10 mg total) by mouth every 4 (four) hours as needed for moderate pain. Patient not taking: Reported on 09/17/2021 08/31/21   [name], MD  Spacer/Aero-Holding Chambers (AEROCHAMBER PLUS) inhaler Use with inhaler 01/20/21   Melynda Ripple, MD    Allergies    Patient has no known allergies.  Review of Systems   Review of Systems  All other systems reviewed and are negative.  Physical Exam Updated Vital Signs BP (!) 163/117   Pulse 87   Temp 98.3 F (36.8 C) (Oral)   Resp 13   SpO2 99%   Physical Exam Vitals and nursing note reviewed.  Constitutional:      General: She is not in acute distress.    Appearance: She is well-developed. She is not diaphoretic.  HENT:     Head: Normocephalic and atraumatic.  Cardiovascular:      Rate and Rhythm: Normal rate and regular rhythm.     Heart sounds: No murmur heard.   No friction rub. No gallop.  Pulmonary:     Effort: Pulmonary  effort is normal. No respiratory distress.     Breath sounds: Normal breath sounds. No wheezing.  Abdominal:     General: Bowel sounds are normal. There is no distension.     Palpations: Abdomen is soft.     Tenderness: There is no abdominal tenderness.  Genitourinary:    Rectum: Normal.     Comments: There is no stool in the rectal vault.  What I was able to obtain was heme-negative.  I see no obvious fissures, but she does have some external hemorrhoids which are noninflamed and not bleeding. Musculoskeletal:        General: Normal range of motion.     Cervical back: Normal range of motion and neck supple.     Comments: The surgical wounds appear to be healing well.  There are Steri-Strips in place and I see no significant erythema or purulent drainage from the wound.  The drainage in the percutaneous drain is serosanguineous and does not appear purulent.  Skin:    General: Skin is warm and dry.  Neurological:     General: No focal deficit present.     Mental Status: She is alert and oriented to person, place, and time.    ED Results / Procedures / Treatments   Labs (all labs ordered are listed, but only abnormal results are displayed) Labs Reviewed  RESP PANEL BY RT-PCR (FLU A&B, COVID) [id number]  COMPREHENSIVE METABOLIC PANEL  CBC WITH DIFFERENTIAL/PLATELET  POC OCCULT BLOOD, ED    EKG None  Radiology DG Chest Port 1 View  Result Date: 09/17/2021 CLINICAL DATA:  50 year old female with history of intermittent fevers. Postoperative pain. Status post left mastectomy on 09/01/2021. EXAM: PORTABLE CHEST 1 VIEW COMPARISON:  Chest x-ray 01/25/2021. FINDINGS: Right-sided subclavian single-lumen porta cath with tip terminating in the distal superior vena cava. Lung volumes are low. No consolidative airspace disease. No pleural  effusions. No pneumothorax. No pulmonary nodule or mass noted. Pulmonary vasculature and the cardiomediastinal silhouette are within normal limits. IMPRESSION: 1. Low lung volumes without radiographic evidence of acute cardiopulmonary disease. Electronically Signed   By: Vinnie Langton M.D.   On: 09/17/2021 06:11    Procedures Procedures   Medications Ordered in ED Medications  sodium chloride 0.9 % bolus 1,000 mL (0 mLs Intravenous Stopped 09/17/21 0623)  morphine 4 MG/ML injection 4 mg (4 mg Intravenous Given 09/17/21 [id number])    ED Course  I have reviewed the triage vital signs and the nursing notes.  Pertinent labs & imaging results that were available during my care of the patient were reviewed by me and considered in my medical decision making (see chart for details).    MDM Rules/Calculators/A&P  Patient presenting with severe pain at her incision site, the etiology of which I am uncertain.  It does not appear red or hot to the touch.  She is afebrile here, but reports fever at home.  She does have a mild white count of 12.7, but the drainage in her drain is serosanguineous and not purulent.  This was discussed with Dr. Harlow Asa from general surgery.  He feels as though patient can safely be discharged, and will make arrangements for her to be seen tomorrow in the clinic for a wound check.  Patient to be discharged with additional pain medication as she tells me she has run out of hers.  She also tells me she has had episodes of rectal bleeding.  This has been a chronic issue and has been worked up with  colonoscopy and endoscopy.  I do not feel as though further work-up into this needs to be done today.  Her hemoglobin is consistent with baseline and she has had no active bleeding while here in the ER.  Final Clinical Impression(s) / ED Diagnoses Final diagnoses:  None    Rx / DC Orders ED Discharge Orders     None        Veryl Speak, MD 09/17/21 [phone]

## 2021-09-17 NOTE — Discharge Instructions (Signed)
Begin taking Percocet as prescribed.  You are to follow-up in the surgery clinic tomorrow.  The office should call you to make these arrangements.

## 2021-09-17 NOTE — ED Triage Notes (Signed)
Patient arrives from home with complaint of intermittent fevers, and post op pain 10/10 x 3 days. Patient had left side Mastectomy on 11/11. EMS reports dark drainage with foul smell from wounds. 250 mcg fentanyl given en route, 22 g RH. (Restricted extremity left side)  EMS vitals: BP 122/68 HR 100 T 99.9 CBG 98 RR 20

## 2021-09-20 ENCOUNTER — Encounter: Payer: Self-pay | Admitting: Adult Health

## 2021-09-23 ENCOUNTER — Other Ambulatory Visit: Payer: Self-pay | Admitting: Oncology

## 2021-09-26 ENCOUNTER — Inpatient Hospital Stay: Payer: Medicare Other | Attending: Oncology | Admitting: Adult Health

## 2021-09-26 ENCOUNTER — Ambulatory Visit: Payer: Medicare Other

## 2021-09-26 ENCOUNTER — Encounter: Payer: Self-pay | Admitting: Adult Health

## 2021-09-26 ENCOUNTER — Other Ambulatory Visit: Payer: Self-pay

## 2021-09-26 ENCOUNTER — Inpatient Hospital Stay: Payer: Medicare Other

## 2021-09-26 VITALS — BP 142/79 | HR 111 | Temp 97.5°F | Resp 18 | Ht 69.0 in | Wt 294.9 lb

## 2021-09-26 DIAGNOSIS — C792 Secondary malignant neoplasm of skin: Secondary | ICD-10-CM | POA: Diagnosis not present

## 2021-09-26 DIAGNOSIS — C50912 Malignant neoplasm of unspecified site of left female breast: Secondary | ICD-10-CM | POA: Diagnosis not present

## 2021-09-26 DIAGNOSIS — C50412 Malignant neoplasm of upper-outer quadrant of left female breast: Secondary | ICD-10-CM | POA: Diagnosis not present

## 2021-09-26 DIAGNOSIS — Z5111 Encounter for antineoplastic chemotherapy: Secondary | ICD-10-CM | POA: Insufficient documentation

## 2021-09-26 DIAGNOSIS — D509 Iron deficiency anemia, unspecified: Secondary | ICD-10-CM

## 2021-09-26 DIAGNOSIS — Z170 Estrogen receptor positive status [ER+]: Secondary | ICD-10-CM | POA: Insufficient documentation

## 2021-09-26 MED ORDER — TRAMADOL HCL 50 MG PO TABS: 50.0000 mg | ORAL_TABLET | Freq: Four times a day (QID) | ORAL | 0 refills | Status: DC | PRN

## 2021-09-26 MED ORDER — TRAMADOL HCL 50 MG PO TABS
50.0000 mg | ORAL_TABLET | Freq: Four times a day (QID) | ORAL | 0 refills | Status: DC | PRN
Start: 2021-09-26 — End: 2021-11-06

## 2021-09-26 MED ORDER — FULVESTRANT 250 MG/5ML IM SOSY
500.0000 mg | PREFILLED_SYRINGE | Freq: Once | INTRAMUSCULAR | Status: AC
  Administered 2021-09-26: 500 mg via INTRAMUSCULAR
  Filled 2021-09-26: qty 10

## 2021-09-26 NOTE — Progress Notes (Addendum)
[place]  [phone] [phone]    ID: Kimberly [name] DOB: 04-01-1971  MR#: [id number]  FBP#:[id number]  Patient Care Team: Lurline Del, DO as PCP - General (Family Medicine) [name], MD as Attending Physician (Radiation Oncology) [name], MD as Consulting Physician (General Surgery) , Charlestine Massed, NP as Nurse Practitioner (Hematology and Oncology) [name], RPH-CPP (Pharmacist) Benay Pike, MD as Consulting Physician (Hematology and Oncology) 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    She began fulvestrant on 08/15/2021 and abemaciclib on 08/29/2021.  She continues on these with good tolerance.  She has not received fulvestrant since her last injection on October 25.  She does have occasional nausea however she has some medication that has been sent into her [facility] on [address] to take her weight is stable today.  On 08/28/2021 she underwent left mastectomy. Pathology from the procedure [phone]) showed: invasive lobular carcinoma, grade 2, spanning a fibrotic area measuring about 9 cm; ductal carcinoma in situ, low grade; lobular carcinoma in situ; carcinoma involves dermis of skin and nipple; all margins negative.  Of the three biopsied lymph nodes, one showed metastatic carcinoma (1/3).  [name] is here today for f/u after her surgery.  She missed her appt with Dr. Jana Hakim on 11/22.  She notes that she has had increased pain at her mastectomy site.  She says that she has had EMS out at her house because of her pain.  She was at the ER on 49/27/2022. She is tearful today during the appointment due  to her pain.   She does that she has been out of the pain medicine with oxycodone for 2 to 3 days.  She called Dr. Cristal Generous office yesterday and heard back from them this morning that they are going to send in more pain medicine.  She is very upset about her pain control.    REVIEW OF SYSTEMS: Review of Systems  Constitutional:  Negative for appetite change, chills, fatigue, fever and unexpected weight change.  HENT:   Negative for hearing loss, lump/mass and trouble swallowing.   Eyes:  Negative for eye problems and icterus.  Respiratory:  Negative for chest tightness, cough and shortness of breath.   Cardiovascular:  Negative for chest pain, leg swelling and palpitations.  Gastrointestinal:  Negative for abdominal distention, abdominal pain, constipation, diarrhea, nausea and vomiting.  Endocrine: Negative for hot flashes.  Genitourinary:  Negative for difficulty urinating.   Musculoskeletal:  Negative for arthralgias.  Skin:  Negative for itching and rash.  Neurological:  Negative for dizziness, extremity weakness, headaches and numbness.  Hematological:  Negative for adenopathy. Does not bruise/bleed easily.  Psychiatric/Behavioral:  Negative for depression. The patient is not nervous/anxious.     COVID 19 VACCINATION STATUS:    BREAST CANCER HISTORY: From the original intake note:  The patient had bilateral screening mammography with tomography 03/19/2014. This was the patient's first ever mammography. Showed a possible mass in the left breast. Left diagnostic mammography and ultrasonography 04/01/2014 showed an irregular mass in the upper outer quadrant of the left breast with a possible satellite 1 cm anterior to it. On physical exam there was a firm palpable mass at the 1:00 position of the left breast 8 cm from the nipple.  There was no palpable left axillary adenopathy. Ultrasound showed an irregular hypoechoic mass in the area in question measuring 1.4 cm. There was a 4 mm  nodule located anterior to this. Ultrasound of the left axilla was benign.  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 The second mass was negative for malignancy. This was felt to be concordant.  On 04/08/2014 the patient underwent bilateral breast MRI. This showed a 9 mm enhancing mass in the subareolar area of the right breast in in the left breast, the previously noted mass measuring 1.3 cm. There were no morphologically abnormal lymph nodes  The right breast finding was followed up on 04/19/2014 with ultrasound which showed an intraductal soft tissue mass in the inferior subareolar worsening of the right breast measuring 8 mm. This was biopsied 04/19/2014 and showed ([id number]) ectatic duct, with no evidence of malignancy. Surgical excision was recommended.  Accordingly on 05/03/2014 the patient underwent right lumpectomy, showing an intraductal papilloma, with known malignancy identified. Left lumpectomy on the same day showed an invasive ductal carcinoma measuring 1.5 cm, with one of the 2 sentinel lymph nodes positive for carcinoma. There was no extracapsular extension. Margins were clear and ample. HER-2 was repeated and was again negative.  The patient's case was discussed in the multidisciplinary breast cancer conference 05/12/2014. An Oncotype had been previously requested and this showed a recurrent score of 22, in the intermediate range. The patient qualifies for the S1007 study and this will be discussed with her. Otherwise the standard recommendation would be chemotherapy followed by radiation followed by anti-estrogens.  The patient's subsequent history is as detailed  below.   PAST MEDICAL HISTORY: Past Medical History:  Diagnosis Date   Acute cholecystitis 04/17/2019   Anemia    Anxiety    Arthritis    Bipolar 1 disorder ([place])    Blood transfusion without reported diagnosis 2007   post bleeding childbirth   Cancer ([place])    left breast cancer 2015   COPD (chronic obstructive pulmonary disease) ([place])    pt reports she is not on oxygen   COVID-19 virus infection 06/07/2019   Depression    Diabetes mellitus without complication ([place])    gestational   Hand injury, right, initial encounter 03/19/2019   Heart murmur    as a child-not adult-no cardiac work up   History of radiation therapy 08/31/14-10/21/14   left breast/ left supraclavicular 50.4 Gy 28 fx, lef tposterior axillary boost 9.52 Gy 28 fx, left rbeast boost/ 10 Gy 5 fx   Personal history of chemotherapy    Personal history of radiation therapy    Rash and nonspecific skin eruption 12/27/2020    PAST SURGICAL HISTORY: Past Surgical History:  Procedure Laterality Date   BREAST BIOPSY Left 04/01/14   BREAST BIOPSY Right 04/19/14   BREAST LUMPECTOMY WITH AXILLARY LYMPH NODE DISSECTION Bilateral 05/03/14   CHOLECYSTECTOMY N/A 04/18/2019   Procedure: LAPAROSCOPIC CHOLECYSTECTOMY WITH INTRAOPERATIVE CHOLANGIOGRAM;  Surgeon: Jovita Kussmaul, MD;  Location: [facility];  Service: General;  Laterality: N/A;   DILATION AND CURETTAGE OF UTERUS     after childbirth   MASS EXCISION Right 07/25/2021   Procedure: RIGHT AXILLARY MASS EXCISION;  Surgeon: [name],  MD;  Location: [facility];  Service: General;  Laterality: Right;   MINOR BREAST BIOPSY Left 07/25/2021   Procedure: SKIN PUNCH BIOPSY LEFT BREAST;  Surgeon: [name], MD;  Location: [facility];  Service: General;  Laterality: Left;   MULTIPLE TOOTH EXTRACTIONS     only 5 left   PORTACATH PLACEMENT Right 06/18/2014   Procedure: INSERTION PORT-A-CATH;  Surgeon: [name], MD;  Location: [facility];  Service: General;   Laterality: Right;   TONSILLECTOMY AND ADENOIDECTOMY     TOTAL MASTECTOMY Left 08/28/2021   Procedure: LEFT TOTAL MASTECTOMY;  Surgeon: [name], MD;  Location: [facility];  Service: General;  Laterality: Left;  25 MINUTES- POST PER KELLY AND SHE WILL PUT IN ROOM    FAMILY HISTORY Family History  Problem Relation Age of Onset   Heart disease Father    Cancer Father        Prostate   Colon cancer Maternal Grandfather    Colon cancer Paternal Grandmother    Stomach cancer Neg Hx    Esophageal cancer Neg Hx    Rectal cancer Neg Hx   The patient's father is currently living at age 110. The patient's mother was murdered at age 54 by the patient's mother's sister. The patient has 2 brothers, one sister. The patient's father hase prostate cancer, diagnosed at age 24. There is no history of breast or ovarian cancer in the family.   GYNECOLOGIC HISTORY:  No LMP recorded. (Menstrual status: Perimenopausal). Menarche age 37, first live birth age 40, the patient is [place] P5. The patient has not had a period in the last 2 months but tells me she has tested for pregnancy x2 and she is not pregnant.   SOCIAL HISTORY:  she is disabled secondary to a nervous breakdown. She is widowed-- her husband Luiz Ochoa passed away several years ago.  She lives at home with her children, ages 73, 16, and 34.  Her son was recently named valedictorian of his high school class.    ADVANCED DIRECTIVES:  not in place    HEALTH MAINTENANCE: Social History   Tobacco Use   Smoking status: Every Day    Packs/day: 1.00    Years: 34.00    Pack years: 34.00    Types: Cigarettes   Smokeless tobacco: Never  Vaping Use   Vaping Use: Never used  Substance Use Topics   Alcohol use: No   Drug use: No     Colonoscopy:  PAP:  Bone density:  Lipid panel:  No Known Allergies  Current Outpatient Medications  Medication Sig Dispense Refill   traMADol (ULTRAM) 50 MG tablet Take 1-2 tablets (50-100 mg total) by mouth  every 6 (six) hours as needed. 60 tablet 0   traMADol (ULTRAM) 50 MG tablet Take 1-2 tablets (50-100 mg total) by mouth every 6 (six) hours as needed. 120 tablet 0   abemaciclib (VERZENIO) 50 MG tablet Take 1 tablet (50 mg total) by mouth 2 (two) times daily. Swallow tablets whole. Do not chew, crush, or split tablets before swallowing. 60 tablet 6   acetaminophen (TYLENOL) 650 MG CR tablet Take 650 mg by mouth every 8 (eight) hours as needed for pain or fever.     albuterol (VENTOLIN HFA) 108 (90 Base) MCG/ACT inhaler USE 2 INHALATIONS BY MOUTH  EVERY 6 HOURS AS NEEDED FOR WHEEZING OR SHORTNESS OF  BREATH (Patient taking differently: 2 puffs every 6 (six) hours as needed for wheezing or shortness of breath.) 34  g 3   AMBULATORY NON FORMULARY MEDICATION Medication Name: Diltiazem 2%/ Lidocaine 5% Using your index finger, apply a small amount of medication inside the rectum up to your first knuckle/joint 4 daily x 8 weeks. (Patient not taking: Reported on 09/17/2021) 38 g 1   anastrozole (ARIMIDEX) 1 MG tablet Take 1 mg by mouth daily.     buPROPion (WELLBUTRIN XL) 150 MG 24 hr tablet Take 150 mg by mouth daily.     doxepin (SINEQUAN) 50 MG capsule Take 50 mg by mouth at bedtime. (Patient not taking: Reported on 09/17/2021)     FLUoxetine (PROZAC) 40 MG capsule Take 40 mg by mouth at bedtime.     Fulvestrant (FASLODEX IM) Inject 2 Doses into the muscle every 14 (fourteen) days. Every other Tuesday     gabapentin (NEURONTIN) 100 MG capsule Take 1 capsule (100 mg total) by mouth at bedtime. 30 capsule 0   hydrocortisone (ANUSOL-HC) 25 MG suppository Use every other night for 2-weeks until prescription is complete (Patient not taking: Reported on 09/17/2021) 7 suppository 0   lamoTRIgine (LAMICTAL) 200 MG tablet Take 200 mg by mouth at bedtime.     lidocaine-prilocaine (EMLA) cream Apply 1 application topically as needed. 30 g 0   loperamide (IMODIUM) 2 MG capsule Take 2 mg by mouth as needed for diarrhea  or loose stools. Take 2 tablets (4 mg) with first sign of diarrhea, followed by 1 tablet (2 mg) with future occurrences of diarrhea. Take up to 8 tablets (16 mg) in 24 hours. (Patient not taking: Reported on 09/17/2021)     methocarbamol (ROBAXIN) 750 MG tablet Take 1 tablet (750 mg total) by mouth 4 (four) times daily. 60 tablet 0   montelukast (SINGULAIR) 10 MG tablet Take 1 tablet (10 mg total) by mouth at bedtime. 30 tablet 3   naproxen (NAPROSYN) 500 MG tablet Take 1 tablet (500 mg total) by mouth 2 (two) times daily with a meal. (Patient not taking: Reported on 09/17/2021) 60 tablet 0   ondansetron (ZOFRAN) 8 MG tablet TAKE 1 TABLET(8 MG) BY MOUTH EVERY 8 HOURS AS NEEDED FOR NAUSEA OR VOMITING 20 tablet 0   pantoprazole (PROTONIX) 40 MG tablet Take 1 tablet (40 mg total) by mouth daily. 30 tablet 0   prazosin (MINIPRESS) 1 MG capsule Take 1 mg by mouth at bedtime.     prochlorperazine (COMPAZINE) 5 MG tablet TAKE 1 TABLET(5 MG) BY MOUTH EVERY 6 HOURS AS NEEDED FOR NAUSEA OR VOMITING. MAY TAKE 10 MG 2 TABLETS IF 5 MG NOT SUFFICIENT. MAY CAUSE 30 tablet 0   QUEtiapine (SEROQUEL) 400 MG tablet Take 400 mg by mouth at bedtime.      rosuvastatin (CRESTOR) 10 MG tablet Take 1 tablet (10 mg total) by mouth daily. 90 tablet 1   Spacer/Aero-Holding Chambers (AEROCHAMBER PLUS) inhaler Use with inhaler 1 each 2   Tiotropium Bromide Monohydrate (SPIRIVA RESPIMAT) 1.25 MCG/ACT AERS Inhale 2 puffs into the lungs daily. 4 each 0   No current facility-administered medications for this visit.    OBJECTIVE: middle-aged [place] woman who appears older than stated age 50:   09/26/21 1333  BP: (!) 142/79  Pulse: (!) 111  Resp: 18  Temp: (!) 97.5 F (36.4 C)  SpO2: 98%       Body mass index is 43.55 kg/m.    ECOG FS:2 - Symptomatic, <50% confined to bed GENERAL: Patient is a well appearing female in no acute distress HEENT:  Sclerae anicteric.  Oropharynx  clear and moist. No ulcerations or evidence  of oropharyngeal candidiasis. Neck is supple.  NODES:  No cervical, supraclavicular, or axillary lymphadenopathy palpated.  BREAST EXAM: Status post left mastectomy, no sign of local recurrence.  No sign of swelling erythema drainage or warmth. LUNGS:  Clear to auscultation bilaterally.  No wheezes or rhonchi. HEART:  Regular rate and rhythm. No murmur appreciated. ABDOMEN:  Soft, nontender.  Positive, normoactive bowel sounds. No organomegaly palpated. MSK:  No focal spinal tenderness to palpation. Full range of motion bilaterally in the upper extremities. EXTREMITIES:  No peripheral edema.   SKIN:  Clear with no obvious rashes or skin changes. No nail dyscrasia. NEURO:  Nonfocal. Well oriented.  Appropriate affect.   LAB RESULTS:  CMP     Component Value Date/Time   NA 133 (L) 09/17/2021 0554   NA 138 03/10/2020 1534   NA 139 01/19/2015 1422   K 3.6 09/17/2021 0554   K 3.8 01/19/2015 1422   CL 104 09/17/2021 0554   CO2 23 09/17/2021 0554   CO2 23 01/19/2015 1422   GLUCOSE 96 09/17/2021 0554   GLUCOSE 93 01/19/2015 1422   BUN 12 09/17/2021 0554   BUN 8 03/10/2020 1534   BUN 8.6 01/19/2015 1422   CREATININE 1.20 (H) 09/17/2021 0554   CREATININE 0.95 08/15/2021 1359   CREATININE 1.1 01/19/2015 1422   CALCIUM 8.6 (L) 09/17/2021 0554   CALCIUM 9.3 01/19/2015 1422   PROT 6.9 09/17/2021 0554   PROT 7.2 02/11/2020 1626   PROT 7.3 01/19/2015 1422   ALBUMIN 3.8 09/17/2021 0554   ALBUMIN 4.4 02/11/2020 1626   ALBUMIN 3.9 01/19/2015 1422   AST 16 09/17/2021 0554   AST 11 (L) 08/15/2021 1359   AST 14 01/19/2015 1422   ALT 10 09/17/2021 0554   ALT 6 08/15/2021 1359   ALT 13 01/19/2015 1422   ALKPHOS 52 09/17/2021 0554   ALKPHOS 82 01/19/2015 1422   BILITOT 0.6 09/17/2021 0554   BILITOT 0.4 08/15/2021 1359   BILITOT 0.35 01/19/2015 1422   GFRNONAA 55 (L) 09/17/2021 0554   GFRNONAA >60 08/15/2021 1359   GFRAA 76 03/10/2020 1534    I No results found for: SPEP  Lab  Results  Component Value Date   WBC 12.7 (H) 09/17/2021   NEUTROABS 9.5 (H) 09/17/2021   HGB 9.1 (L) 09/17/2021   HCT 29.7 (L) 09/17/2021   MCV 82.3 09/17/2021   PLT 193 09/17/2021      Chemistry      Component Value Date/Time   NA 133 (L) 09/17/2021 0554   NA 138 03/10/2020 1534   NA 139 01/19/2015 1422   K 3.6 09/17/2021 0554   K 3.8 01/19/2015 1422   CL 104 09/17/2021 0554   CO2 23 09/17/2021 0554   CO2 23 01/19/2015 1422   BUN 12 09/17/2021 0554   BUN 8 03/10/2020 1534   BUN 8.6 01/19/2015 1422   CREATININE 1.20 (H) 09/17/2021 0554   CREATININE 0.95 08/15/2021 1359   CREATININE 1.1 01/19/2015 1422      Component Value Date/Time   CALCIUM 8.6 (L) 09/17/2021 0554   CALCIUM 9.3 01/19/2015 1422   ALKPHOS 52 09/17/2021 0554   ALKPHOS 82 01/19/2015 1422   AST 16 09/17/2021 0554   AST 11 (L) 08/15/2021 1359   AST 14 01/19/2015 1422   ALT 10 09/17/2021 0554   ALT 6 08/15/2021 1359   ALT 13 01/19/2015 1422   BILITOT 0.6 09/17/2021 0554   BILITOT 0.4 08/15/2021  1359   BILITOT 0.35 01/19/2015 1422       Lab Results  Component Value Date   LABCA2 28 04/30/2014    No components found for: [id number]  No results for input(s): INR in the last 168 hours.  Urinalysis    Component Value Date/Time   COLORURINE YELLOW 03/07/2017 2111   APPEARANCEUR CLEAR 03/07/2017 2111   LABSPEC 1.016 03/07/2017 2111   PHURINE 7.0 03/07/2017 2111   GLUCOSEU NEGATIVE 03/07/2017 2111   HGBUR NEGATIVE 03/07/2017 2111   BILIRUBINUR negative 03/10/2020 1515   KETONESUR negative 03/10/2020 1515   [place] 03/07/2017 2111   PROTEINUR =30 (A) 03/10/2020 1515   PROTEINUR NEGATIVE 03/07/2017 2111   UROBILINOGEN 0.2 03/10/2020 1515   UROBILINOGEN 0.2 07/10/2014 1951   NITRITE Negative 03/10/2020 1515   NITRITE NEGATIVE 03/07/2017 2111   LEUKOCYTESUR Negative 03/10/2020 1515    STUDIES: DG Chest Port 1 View  Result Date: 09/17/2021 CLINICAL DATA:  50 year old female with  history of intermittent fevers. Postoperative pain. Status post left mastectomy on 09/01/2021. EXAM: PORTABLE CHEST 1 VIEW COMPARISON:  Chest x-ray 01/25/2021. FINDINGS: Right-sided subclavian single-lumen porta cath with tip terminating in the distal superior vena cava. Lung volumes are low. No consolidative airspace disease. No pleural effusions. No pneumothorax. No pulmonary nodule or mass noted. Pulmonary vasculature and the cardiomediastinal silhouette are within normal limits. IMPRESSION: 1. Low lung volumes without radiographic evidence of acute cardiopulmonary disease. Electronically Signed   By: Vinnie Langton M.D.   On: 09/17/2021 06:11     ASSESSMENT: 50 y.o. BRCA negative [place] woman status post left lumpectomy and sentinel lymph node sampling 05/03/2014 for a pT1c pN0, stage IA invasive ductal carcinoma, grade 2, estrogen receptor 83% positive, progesterone receptor 66% positive, with an MIB-1 of 14% and no HER-2 amplification.  (1) Oncotype score of 22 predicts a risk of outside the breast recurrence within 10 years of 13% if the patient's only systemic therapy is tamoxifen for 5 years.  (2) adjuvant chemotherapy with cyclophosphamide and docetaxel started 07/06/2014, patient tolerated chemotherapy very poorly and it was discontinued after one cycle.    (3) adjuvant radiation completed 10/21/2014 1) Left Breast / 50.4 Gy in 28 fractions 2) Left supraclavicular / 50.4 Gy in 28 fractions 3) Left Posterior axillary boost  / 9.52 Gy in 28 fractions 4) Left Breast Boost / 10 Gy in 5 fractions  (4) anastrozole started 01/19/2015-[name] informed me she never started this.   METASTATIC DISEASE to skin: September 2022 (5) mammography 05/24/2021 shows fluid collection in the right axilla, no obvious malignancy within each breast but superficial skin lesions throughout the upper outer left breast.  (A) CT scan of the abdomen and pelvis with contrast 06/10/2021 shows small  low-attenuation liver lesions which could be cysts but no definite evidence of metastatic disease.  (B) chest CT scan 07/14/2021 shows no evidence of metastatic disease  (C) CT head with and without contrast on 07/15/2021 shows no evidence of metastatic disease (D) biopsy left breast skin lesion 07/25/2021 shows metastatic carcinoma, estrogen and progesterone receptor positive, HER2 not amplified (1+)  (E) excision right axillary mass 07/25/2021 shows benign epidermoid cyst (E) CA 27-29 on 07/05/2021 is normal at 21.1  (6) iron deficiency anemia: On 07/05/2021 the ferritin was less than 4 and the iron saturation 3%; hemoglobin was 9.1 with an MCV of 71.3  (A) Venofer started on 07/18/2021, to be repeated x5   (B) GI evaluation 08/11/2021  (7) to start fulvestrant 09/13/2021  (A)  to start abemaciclib 50 mg twice daily starting 09/14/2019  (8) status post left mastectomy 08/28/2021 for a pT3 pNX invasive lobular carcinoma, grade 2, with negative margins.  (9) adjuvant radiation pending   PLAN: Ryonna is tearful and upset today about her pain.  She met with Dr. Jana Hakim as well to discuss this further.  We will send in some tramadol for her to take.  She will continue on with Tylenol and Motrin 3 times daily.  She will also take the tramadol as needed for her pain as written.  She was counseled on the appropriate bowel regimen and making sure she has a bowel movement regularly.  She will continue on Verzenio twice daily.  She is currently at 50 mg twice a day and they are making no adjustments to this at this time.  She is going to pick up her antinausea medicine which will hopefully make it easier to take and be able to eat.    Biana will proceed the fulvestrant injection today.  She will be due for repeat injection in 2 weeks.  In 6 weeks she will have labs follow-up with Dr. Chryl Heck and have her next injection.  She knows to call for any questions or concerns prior to her next  appointment with [name].  Total encounter time: 30 minutes   Wilber Bihari, NP 09/26/21 3:56 PM Medical Oncology and Hematology [facility] [place], [place] [address] Tel. [phone]    Fax. [phone]   ADDENDUM: Mliss Sax is tolerating her treatment generally well.  She is distressed because of pain issues.  She tells me that she is already taking Tylenol and Aleve or Advil 3 times a day.  We are going to add Ultram.  She has a good understanding of the possible toxicities side effects and complications of this agent.  If she requires more intensive agents she will be referred to a pain clinic.  We will continue the abemaciclib until she starts radiation and at that point we will interrupted until she is done with radiation.  I personally saw this patient and performed a substantive portion of this encounter with the listed APP documented above.   [name], MD Medical Oncology and Hematology [facility] [address] [place], [place] [address] Tel. [phone]    Fax. [phone]     *Total Encounter Time as defined by the Centers for Medicare and Medicaid Services includes, in addition to the face-to-face time of a patient visit (documented in the note above) non-face-to-face time: obtaining and reviewing outside history, ordering and reviewing medications, tests or procedures, care coordination (communications with other health care professionals or caregivers) and documentation in the medical record.

## 2021-09-27 ENCOUNTER — Telehealth: Payer: Self-pay | Admitting: Radiation Oncology

## 2021-09-27 NOTE — Telephone Encounter (Signed)
Called patient to schedule consultation with Dr. Squire. No answer, LVM for return call. 

## 2021-09-28 ENCOUNTER — Encounter: Payer: Self-pay | Admitting: Adult Health

## 2021-09-28 ENCOUNTER — Other Ambulatory Visit: Payer: Self-pay | Admitting: Oncology

## 2021-09-28 NOTE — Progress Notes (Signed)
We will hold the abemaciclib until radiaiton has been completed   GM

## 2021-10-03 ENCOUNTER — Inpatient Hospital Stay: Payer: Medicare Other | Admitting: Pharmacist

## 2021-10-03 DIAGNOSIS — C50912 Malignant neoplasm of unspecified site of left female breast: Secondary | ICD-10-CM

## 2021-10-03 NOTE — Progress Notes (Signed)
[place]       Telephone: [phone]?Fax: [phone]   Oncology Clinical Pharmacist Practitioner Progress Note  Kimberly [name] is a 50 y.o. female with a diagnosis of breast cancer currently on abemaciclib / fulvestrant under the care of Dr. Lurline Del  Interval History Kimberly [name] was contacted today via telephone by clinical pharmacy after speaking to Dr. Jana Hakim about the plan to hold abemaciclib 5 days prior to starting radiation.  Kimberly [name] has her CT Simulation on 10/11/21 with Dr. Isidore Moos and sees clinical pharmacy the day before for labs and her fulvestrant injection.  The plan for holding abemaciclib was communicated to Kimberly [name] today.  She is doing well despite being a little sleepy.  She states she is having no side effects from the abemaciclib currently.  She understands that a radiation start date may be given to her once her simulation is complete next week.  Follow-Up Plan Continue abemaciclib for now Hold abemaciclib 5 days prior to starting radiation per Dr. Virgie Dad instructions Labs / pharmacy clinic visit / fulvestrant on 10/10/21 Dr. Isidore Moos visit on 10/11/21 with CT Simulation same day  Kimberly [name] participated in the discussion, expressed understanding, and voiced agreement with the above plan. All questions were answered to her satisfaction. The patient was advised to contact the clinic at [phone] with any questions or concerns prior to her return visit.  Clinical pharmacy will continue to support Kimberly [name] and Dr. Jana Hakim as needed.  I spent 15 minutes assessing the patient.  [name], RPH-CPP,  10/03/2021  9:46 AM

## 2021-10-09 NOTE — Progress Notes (Signed)
Location of Breast Cancer: Recurrent estrogen receptor positive LEFT breast cancer  Histology per Pathology Report:  08/28/2021 FINAL MICROSCOPIC DIAGNOSIS:  A. 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 CT scan of the abdomen and pelvis with contrast 06/10/2021 shows small low-attenuation liver lesions which could be cysts but no definite evidence of metastatic disease. chest CT scan 07/14/2021 shows no evidence of metastatic disease CT head with and without contrast on 07/15/2021 shows no evidence of metastatic disease biopsy left breast skin lesion 07/25/2021 shows metastatic carcinoma, estrogen and progesterone receptor positive, HER2 not amplified (1+)  excision right axillary mass 07/25/2021 shows benign epidermoid cyst  Past/Anticipated interventions by surgeon, if any:  08/28/2021 --Dr. Rolm [name] 1.  Left mastectomy 2.  Fishtail plasty closure of lateral portion of the wound  Past/Anticipated interventions by medical oncology, if any:  Under care of Dr. Sarajane Jews [name]  --09/26/2021 Kimberly [name]-NP office note) She will continue on Verzenio twice daily.  She is currently at 50 mg twice a day and they are making no adjustments to  this at this time.   She is going to pick up her antinausea medicine which will hopefully make it easier to take and be able to eat.   Kimberly [name] will proceed the fulvestrant injection today.  She will be due for repeat injection in 2 weeks.   In 6 weeks she will have labs follow-up with Dr. Chryl Heck and have her next injection.  adjuvant chemotherapy with cyclophosphamide and docetaxel started 07/06/2014, patient tolerated chemotherapy very poorly and it was discontinued after one cycle.   adjuvant radiation completed 10/21/2014 anastrozole started 01/19/2015-Kimberly [name] informed me she never started this. METASTATIC DISEASE to skin: September 2022 to start fulvestrant 09/13/2021 to start abemaciclib 50 mg twice daily starting 09/14/2019 status post left mastectomy 08/28/2021 for a pT3 pNX invasive lobular carcinoma, grade 2, with negative margins.  adjuvant radiation pending  Lymphedema issues, if any:  no    Pain issues, if any:  7/10 nagging pain to left mastectomy area   SAFETY ISSUES: Prior radiation? Yes: 08/31/2014-10/21/2014 1) Left Breast / 50.4 Gy in 28 fractions 2) Left supraclavicular / 50.4 Gy in 28 fractions 3) Left Posterior axillary boost  / 9.52 Gy in 28 fractions 4) Left Breast Boost / 10 Gy in 5 fractions Pacemaker/ICD? no Possible current pregnancy? No--postmenopausal Is the patient on methotrexate? no  Current Complaints / other details:  none    Vitals:   10/11/21 0937  BP: (!) 150/76  Pulse: 92  Resp: 18  Temp: (!) 96.6 F (35.9 C)  TempSrc: Temporal  SpO2: 100%  Weight: 287 lb 2 oz (130.2 kg)  Height: _0  (1.753 m)

## 2021-10-10 ENCOUNTER — Inpatient Hospital Stay: Payer: Medicare Other | Admitting: Pharmacist

## 2021-10-10 ENCOUNTER — Other Ambulatory Visit: Payer: Self-pay

## 2021-10-10 ENCOUNTER — Ambulatory Visit (INDEPENDENT_AMBULATORY_CARE_PROVIDER_SITE_OTHER): Payer: Medicare Other

## 2021-10-10 ENCOUNTER — Encounter: Payer: Self-pay | Admitting: *Deleted

## 2021-10-10 ENCOUNTER — Inpatient Hospital Stay: Payer: Medicare Other

## 2021-10-10 VITALS — Ht 69.0 in | Wt 289.0 lb

## 2021-10-10 VITALS — BP 144/76 | HR 83 | Temp 97.6°F | Resp 18 | Ht 69.0 in | Wt 287.0 lb

## 2021-10-10 DIAGNOSIS — C50412 Malignant neoplasm of upper-outer quadrant of left female breast: Secondary | ICD-10-CM | POA: Diagnosis not present

## 2021-10-10 DIAGNOSIS — C792 Secondary malignant neoplasm of skin: Secondary | ICD-10-CM | POA: Diagnosis not present

## 2021-10-10 DIAGNOSIS — D509 Iron deficiency anemia, unspecified: Secondary | ICD-10-CM

## 2021-10-10 DIAGNOSIS — Z0000 Encounter for general adult medical examination without abnormal findings: Secondary | ICD-10-CM

## 2021-10-10 DIAGNOSIS — Z95828 Presence of other vascular implants and grafts: Secondary | ICD-10-CM

## 2021-10-10 DIAGNOSIS — Z170 Estrogen receptor positive status [ER+]: Secondary | ICD-10-CM | POA: Diagnosis not present

## 2021-10-10 DIAGNOSIS — Z5111 Encounter for antineoplastic chemotherapy: Secondary | ICD-10-CM | POA: Diagnosis not present

## 2021-10-10 LAB — CBC WITH DIFFERENTIAL (CANCER CENTER ONLY)
Abs Immature Granulocytes: 0.01 10*3/uL (ref 0.00–0.07)
Basophils Absolute: 0 10*3/uL (ref 0.0–0.1)
Basophils Relative: 1 %
Eosinophils Absolute: 0 10*3/uL (ref 0.0–0.5)
Eosinophils Relative: 0 %
HCT: 30 % — ABNORMAL LOW (ref 36.0–46.0)
Hemoglobin: 9.3 g/dL — ABNORMAL LOW (ref 12.0–15.0)
Immature Granulocytes: 0 %
Lymphocytes Relative: 53 %
Lymphs Abs: 1.9 10*3/uL (ref 0.7–4.0)
MCH: 25.2 pg — ABNORMAL LOW (ref 26.0–34.0)
MCHC: 31 g/dL (ref 30.0–36.0)
MCV: 81.3 fL (ref 80.0–100.0)
Monocytes Absolute: 0.3 10*3/uL (ref 0.1–1.0)
Monocytes Relative: 9 %
Neutro Abs: 1.3 10*3/uL — ABNORMAL LOW (ref 1.7–7.7)
Neutrophils Relative %: 37 %
Platelet Count: 223 10*3/uL (ref 150–400)
RBC: 3.69 MIL/uL — ABNORMAL LOW (ref 3.87–5.11)
RDW: 22.6 % — ABNORMAL HIGH (ref 11.5–15.5)
WBC Count: 3.6 10*3/uL — ABNORMAL LOW (ref 4.0–10.5)
nRBC: 0 % (ref 0.0–0.2)

## 2021-10-10 LAB — CMP (CANCER CENTER ONLY)
ALT: 5 U/L (ref 0–44)
AST: 8 U/L — ABNORMAL LOW (ref 15–41)
Albumin: 4 g/dL (ref 3.5–5.0)
Alkaline Phosphatase: 55 U/L (ref 38–126)
Anion gap: 6 (ref 5–15)
BUN: 6 mg/dL (ref 6–20)
CO2: 27 mmol/L (ref 22–32)
Calcium: 9.3 mg/dL (ref 8.9–10.3)
Chloride: 106 mmol/L (ref 98–111)
Creatinine: 1.17 mg/dL — ABNORMAL HIGH (ref 0.44–1.00)
GFR, Estimated: 57 mL/min — ABNORMAL LOW (ref >60)
Glucose, Bld: 87 mg/dL (ref 70–99)
Potassium: 3.8 mmol/L (ref 3.5–5.1)
Sodium: 139 mmol/L (ref 135–145)
Total Bilirubin: 0.3 mg/dL (ref 0.3–1.2)
Total Protein: 7.3 g/dL (ref 6.5–8.1)

## 2021-10-10 MED ORDER — FULVESTRANT 250 MG/5ML IM SOSY
500.0000 mg | PREFILLED_SYRINGE | Freq: Once | INTRAMUSCULAR | Status: AC
  Administered 2021-10-10: 15:00:00 500 mg via INTRAMUSCULAR
  Filled 2021-10-10: qty 10

## 2021-10-10 MED ORDER — SODIUM CHLORIDE 0.9% FLUSH
10.0000 mL | INTRAVENOUS | Status: AC | PRN
  Administered 2021-10-10: 14:00:00 10 mL

## 2021-10-10 MED ORDER — HEPARIN SOD (PORK) LOCK FLUSH 100 UNIT/ML IV SOLN
500.0000 [IU] | INTRAVENOUS | Status: AC | PRN
  Administered 2021-10-10: 14:00:00 500 [IU]

## 2021-10-10 NOTE — Patient Instructions (Signed)
Fulvestrant injection °What is this medication? °FULVESTRANT (ful VES trant) blocks the effects of estrogen. It is used to treat breast cancer. °This medicine may be used for other purposes; ask your health care provider or pharmacist if you have questions. °COMMON BRAND NAME(S): FASLODEX °What should I tell my care team before I take this medication? °They need to know if you have any of these conditions: °bleeding disorders °liver disease °low blood counts, like low white cell, platelet, or red cell counts °an unusual or allergic reaction to fulvestrant, other medicines, foods, dyes, or preservatives °pregnant or trying to get pregnant °breast-feeding °How should I use this medication? °This medicine is for injection into a muscle. It is usually given by a health care professional in a hospital or clinic setting. °Talk to your pediatrician regarding the use of this medicine in children. Special care may be needed. °Overdosage: If you think you have taken too much of this medicine contact a poison control center or emergency room at once. °NOTE: This medicine is only for you. Do not share this medicine with others. °What if I miss a dose? °It is important not to miss your dose. Call your doctor or health care professional if you are unable to keep an appointment. °What may interact with this medication? °medicines that treat or prevent blood clots like warfarin, enoxaparin, dalteparin, apixaban, dabigatran, and rivaroxaban °This list may not describe all possible interactions. Give your health care provider a list of all the medicines, herbs, non-prescription drugs, or dietary supplements you use. Also tell them if you smoke, drink alcohol, or use illegal drugs. Some items may interact with your medicine. °What should I watch for while using this medication? °Your condition will be monitored carefully while you are receiving this medicine. You will need important blood work done while you are taking this  medicine. °Do not become pregnant while taking this medicine or for at least 1 year after stopping it. Women of child-bearing potential will need to have a negative pregnancy test before starting this medicine. Women should inform their doctor if they wish to become pregnant or think they might be pregnant. There is a potential for serious side effects to an unborn child. Men should inform their doctors if they wish to father a child. This medicine may lower sperm counts. Talk to your health care professional or pharmacist for more information. Do not breast-feed an infant while taking this medicine or for 1 year after the last dose. °What side effects may I notice from receiving this medication? °Side effects that you should report to your doctor or health care professional as soon as possible: °allergic reactions like skin rash, itching or hives, swelling of the face, lips, or tongue °feeling faint or lightheaded, falls °pain, tingling, numbness, or weakness in the legs °signs and symptoms of infection like fever or chills; cough; flu-like symptoms; sore throat °vaginal bleeding °Side effects that usually do not require medical attention (report to your doctor or health care professional if they continue or are bothersome): °aches, pains °constipation °diarrhea °headache °hot flashes °nausea, vomiting °pain at site where injected °stomach pain °This list may not describe all possible side effects. Call your doctor for medical advice about side effects. You may report side effects to FDA at 1-800-FDA-1088. °Where should I keep my medication? °This drug is given in a hospital or clinic and will not be stored at home. °NOTE: This sheet is a summary. It may not cover all possible information. If you have   questions about this medicine, talk to your doctor, pharmacist, or health care provider. °© 2022 [employer]vier/Gold Standard (2018-01-21 00:00:00) ° °

## 2021-10-10 NOTE — Progress Notes (Signed)
[place]       Telephone: [phone]?Fax: [phone]   Oncology Clinical Pharmacist Practitioner Progress Note  Shyann Hefner was contacted via in-person to discuss her chemotherapy regimen for abemaciclib which they receive under the care of Dr. Jana Hakim. She will be transitioning to Dr. Chryl Heck at her next visit as Dr. Jana Hakim will be retiring.  Current treatment regimen and start date - abemaciclib (08/29/21) - fulvestrant (08/15/21)  Interval History She continues on abemaciclib 50 mg by mouth  every 12 hours on days 1 to 28 of a 28-day cycle. This is being given  in combination with fulvestrant which she will be receiving today . Therapy is planned to continue until disease progression or unacceptable toxicity.   Response to Therapy Ms. Fischl is doing well.  She last saw Dr. Jana Hakim on 09/12/21 and Wilber Bihari NP on 09/26/21.  At that time, it was planned that she would receive fulvestrant today and then every 4 weeks thereafter.  Discussed the plan with Ms. Michels today and will update her appointments.  She will next have fulvestrant on 11/07/21 and have labs and see Dr. Chryl Heck on this date.  She continues to tolerate the abemaciclib well with no diarrhea. She has some nausea which she takes ondansetron and prochlorperazine as needed.  We reviewed the dosing and frequency of these medications again today. She continues to lose weight because of lack of appetite but she feels her quality of life is good and likes losing the weight because she states she was much heavier before which she did not like.  She likes eating crackers with peanut butter and pears.  We discussed the importance of trying to maintain a well rounded diet and to exercise as tolerated.  She feels she does lie around a lot and we discussed trying to stay active little by little increasing her tolerance.  We also discussed the importance of staying active due to the risk of VTE with abemaciclib.  We  again reviewed warnings signs for VTE. While the abemaciclib can cause fatigue, she is also on some other medications that may cause fatigue and she states she will speak to her psychiatrist about the lack of energy issues as well to see if possible dose adjustments can be made.  Unfortunately she is already on the lowest recommended dose of abemaciclib.   She meets with Dr. Isidore Moos from radiation oncology tomorrow to discuss an xrt plan.  We reviewed again that per Dr. Jana Hakim, she should stop abemaciclib 5 days prior to starting xrt. These instructions were written down for her per her request. Restarting abemaciclib will be a decision between Dr. Isidore Moos and Dr. Chryl Heck sometime once xrt has finished.   Ms. Finnicum has 3 kids at home, 5 total.  The 3 at home are ages 11, 6, and 76.  They help a lot with her care which she is grateful for.  She requested another letter for her son as he needs to take off work to bring her to the clinic and Hinda Lenis, Dr. Virgie Dad nurse, will be working on obtaining this for her. Labs, vitals, treatment parameters, and manufacturer guidelines assessing toxicity were reviewed with [name] today. Based on these values, patient is in agreement to continue therapy at this time.  Allergies No Known Allergies  Vitals Vitals with BMI 10/10/2021 10/10/2021 09/26/2021  Height 5\' 9"  5\' 9"  5\' 9"   Weight 287 lbs 289 lbs 294 lbs 14 oz  BMI 42.36 42.66 43.53  Systolic 353 - 614  Diastolic 76 - 79  Pulse 83 - 111     Laboratory Data CBC EXTENDED Latest Ref Rng & Units 10/10/2021 09/17/2021 08/15/2021  WBC 4.0 - 10.5 K/uL 3.6(L) 12.7(H) 5.2  RBC 3.87 - 5.11 MIL/uL 3.69(L) 3.61(L) 4.42  HGB 12.0 - 15.0 g/dL 9.3(L) 9.1(L) 10.1(L)  HCT 36.0 - 46.0 % 30.0(L) 29.7(L) 33.9(L)  PLT 150 - 400 K/uL 223 193 235  NEUTROABS 1.7 - 7.7 K/uL 1.3(L) 9.5(H) 2.6  LYMPHSABS 0.7 - 4.0 K/uL 1.9 2.4 2.0    CMP Latest Ref Rng & Units 10/10/2021 09/17/2021 08/15/2021  Glucose 70 -  99 mg/dL 87 96 77  BUN 6 - 20 mg/dL 6 12 8   Creatinine 0.44 - 1.00 mg/dL 1.17(H) 1.20(H) 0.95  Sodium 135 - 145 mmol/L 139 133(L) 139  Potassium 3.5 - 5.1 mmol/L 3.8 3.6 3.9  Chloride 98 - 111 mmol/L 106 104 106  CO2 22 - 32 mmol/L 27 23 24   Calcium 8.9 - 10.3 mg/dL 9.3 8.6(L) 9.1  Total Protein 6.5 - 8.1 g/dL 7.3 6.9 7.4  Total Bilirubin 0.3 - 1.2 mg/dL 0.3 0.6 0.4  Alkaline Phos 38 - 126 U/L 55 52 62  AST 15 - 41 U/L 8(L) 16 11(L)  ALT 0 - 44 U/L 5 10 6     Lab Results  Component Value Date   MG 3.1 (H) 08/24/2020    Adverse Effects Assessment Nausea: controlled with ondansetron and prochlorperazine Lose of appetite: Ms. Repetto says she feels good despite not having an appetite and has no desire to see nutritional services for meal options.  She states she was overweight before and did not like how she felt Fatigue: as above, recommended staying mobile and exercising as tolerable.  She will also discuss with her psychiatrist as she is on several medications that could cause fatigue/drowsiness. She is on the lowest dose of abemaciclib recommended currently  Adherence Assessment Labrenda Lasky reports missing 0 doses over the past 2 weeks.   Reason for missed dose: N/A Patient was re-educated on importance of adherence.   Access Assessment Shantice Menger is currently receiving her abemaciclib through  [facility] concerns:  none  Medication Reconciliation The patient's medication list was reviewed today with the patient? Yes New medications or herbal supplements have recently been started? No  Any medications have been discontinued?  Yes, Ms. Agent states she is not taking gabapentin or acetaminophen any longer.  The medication list was updated and reconciled based on the patient's most recent medication list in the electronic medical record (EMR) including herbal products and OTC medications.   Medications Current Outpatient Medications   Medication Sig Dispense Refill   abemaciclib (VERZENIO) 50 MG tablet Take 1 tablet (50 mg total) by mouth 2 (two) times daily. Swallow tablets whole. Do not chew, crush, or split tablets before swallowing. 60 tablet 6   albuterol (VENTOLIN HFA) 108 (90 Base) MCG/ACT inhaler USE 2 INHALATIONS BY MOUTH  EVERY 6 HOURS AS NEEDED FOR WHEEZING OR SHORTNESS OF  BREATH (Patient taking differently: 2 puffs every 6 (six) hours as needed for wheezing or shortness of breath.) 34 g 3   AMBULATORY NON FORMULARY MEDICATION Medication Name: Diltiazem 2%/ Lidocaine 5% Using your index finger, apply a small amount of medication inside the rectum up to your first knuckle/joint 4 daily x 8 weeks. 38 g 1   buPROPion (WELLBUTRIN XL) 150 MG 24 hr tablet Take 150 mg by mouth daily.  doxepin (SINEQUAN) 50 MG capsule Take 50 mg by mouth at bedtime.     FLUoxetine (PROZAC) 40 MG capsule Take 40 mg by mouth at bedtime.     Fulvestrant (FASLODEX IM) Inject 2 Doses into the muscle every 14 (fourteen) days. Every other Tuesday     lamoTRIgine (LAMICTAL) 200 MG tablet Take 200 mg by mouth at bedtime.     lidocaine-prilocaine (EMLA) cream Apply 1 application topically as needed. 30 g 0   ondansetron (ZOFRAN) 8 MG tablet TAKE 1 TABLET(8 MG) BY MOUTH EVERY 8 HOURS AS NEEDED FOR NAUSEA OR VOMITING 20 tablet 0   pantoprazole (PROTONIX) 40 MG tablet Take 1 tablet (40 mg total) by mouth daily. 30 tablet 0   prazosin (MINIPRESS) 1 MG capsule Take 1 mg by mouth at bedtime.     prochlorperazine (COMPAZINE) 5 MG tablet TAKE 1 TABLET(5 MG) BY MOUTH EVERY 6 HOURS AS NEEDED FOR NAUSEA OR VOMITING. MAY TAKE 10 MG 2 TABLETS IF 5 MG NOT SUFFICIENT. MAY CAUSE 30 tablet 0   QUEtiapine (SEROQUEL) 400 MG tablet Take 400 mg by mouth at bedtime.      rosuvastatin (CRESTOR) 10 MG tablet Take 1 tablet (10 mg total) by mouth daily. 90 tablet 1   Spacer/Aero-Holding Chambers (AEROCHAMBER PLUS) inhaler Use with inhaler 1 each 2   Tiotropium Bromide  Monohydrate (SPIRIVA RESPIMAT) 1.25 MCG/ACT AERS Inhale 2 puffs into the lungs daily. 4 each 0   acetaminophen (TYLENOL) 650 MG CR tablet Take 650 mg by mouth every 8 (eight) hours as needed for pain or fever. (Patient not taking: Reported on 10/10/2021)     gabapentin (NEURONTIN) 100 MG capsule Take 1 capsule (100 mg total) by mouth at bedtime. (Patient not taking: Reported on 10/10/2021) 30 capsule 0   hydrocortisone (ANUSOL-HC) 25 MG suppository Use every other night for 2-weeks until prescription is complete (Patient not taking: Reported on 10/10/2021) 7 suppository 0   loperamide (IMODIUM) 2 MG capsule Take 2 mg by mouth as needed for diarrhea or loose stools. Take 2 tablets (4 mg) with first sign of diarrhea, followed by 1 tablet (2 mg) with future occurrences of diarrhea. Take up to 8 tablets (16 mg) in 24 hours. (Patient not taking: Reported on 09/17/2021)     methocarbamol (ROBAXIN) 750 MG tablet Take 1 tablet (750 mg total) by mouth 4 (four) times daily. (Patient not taking: Reported on 10/10/2021) 60 tablet 0   montelukast (SINGULAIR) 10 MG tablet Take 1 tablet (10 mg total) by mouth at bedtime. (Patient not taking: Reported on 10/10/2021) 30 tablet 3   naproxen (NAPROSYN) 500 MG tablet Take 1 tablet (500 mg total) by mouth 2 (two) times daily with a meal. (Patient not taking: Reported on 09/17/2021) 60 tablet 0   traMADol (ULTRAM) 50 MG tablet Take 1-2 tablets (50-100 mg total) by mouth every 6 (six) hours as needed. 60 tablet 0   traMADol (ULTRAM) 50 MG tablet Take 1-2 tablets (50-100 mg total) by mouth every 6 (six) hours as needed. 120 tablet 0   No current facility-administered medications for this visit.   Facility-Administered Medications Ordered in Other Visits  Medication Dose Route Frequency Provider Last Rate Last Admin   fulvestrant (FASLODEX) injection 500 mg  500 mg Intramuscular Once Magrinat, Virgie Dad, MD        Drug-Drug Interactions (DDIs) DDIs were evaluated?  Yes Significant DDIs? No  The patient was instructed to speak with their health care provider and/or the oral chemotherapy pharmacist  before starting any new drug, including prescription or over the counter, natural / herbal products, or vitamins.  Supportive Care Diarrhea: we reviewed that diarrhea is common with abemaciclib and confirmed that she does have loperamide (Imodium) at home.  We reviewed how to take this medication PRN. No diarrhea currently. Neutropenia: we discussed the importance of having a thermometer and what the Centers for Disease Control and Prevention (CDC) considers a fever which is 100.70F (38C) or higher.  Gave patient 24/7 triage line to call if any fevers or symptoms ILD/Pneumonitis: we reviewed potential symptoms including cough, shortness, and fatigue.  Hepatotoxicity: WNL VTE: reviewed signs of DVT such as leg swelling, redness, pain, or tenderness and signs of PE such as shortness of breath, rapid or irregular heartbeat, cough, chest pain, or lightheadedness Reviewed to take the medication every 12 hours (with food sometimes can be easier on the stomach) and to take it at the same time every day. Continues to take at 8 am and 8 pm.  Dosing Assessment Hepatic adjustments needed? No  Renal adjustments needed? No  Toxicity adjustments needed? No  The current dosing regimen is appropriate to continue at this time.  Follow-Up Plan Fulvestrant today and then every 4 weeks. Will see Dr. Chryl Heck with labs in 4 weeks on 11/07/21. Will update visits Dr. Isidore Moos tomorrow and radiation to follow. Will hold abemaciclib 5 days prior to starting xrt per Dr. Virgie Dad instructions [place] flush with labs, pharmacy visit, and fulvestrant in 8 weeks  [name] participated in the discussion, expressed understanding, and voiced agreement with the above plan. All questions were answered to her satisfaction. The patient was advised to contact the clinic at [phone] with any  questions or concerns prior to her return visit.   I spent 30 minutes assessing and educating the patient.  [name], RPH-CPP, 10/10/2021  2:51 PM

## 2021-10-10 NOTE — Progress Notes (Signed)
Subjective:   Kimberly [name] is a 50 y.o. female who presents for Medicare Annual (Subsequent) preventive examination.  Patient consented to have virtual visit and was identified by name and date of birth. Method of visit: Telephone  Encounter participants: Patient: Kimberly [name] - located at Home Nurse/Provider: Dorna Bloom - located at [facility] Others (if applicable): NA  Review of Systems: Defer to PCP.  Cardiac Risk Factors include: obesity (BMI >30kg/m2);smoking/ tobacco exposure  Objective:   Vitals: Ht 5\' 9"  (1.753 m)    Wt 289 lb (131.1 kg)    BMI 42.68 kg/m   Body mass index is 42.68 kg/m.  Advanced Directives 10/10/2021 09/17/2021 08/28/2021 08/24/2021 08/15/2021 07/25/2021 07/19/2021  Does Patient Have a Medical Advance Directive? No No No No No No No  Type of Advance Directive - - - - - - -  Does patient want to make changes to medical advance directive? - - - - - - -  Copy of [place] in Chart? - - - - - - -  Would patient like information on creating a medical advance directive? Yes (MAU/Ambulatory/Procedural Areas - Information given) No - Patient declined No - Patient declined No - Patient declined - No - Patient declined No - Patient declined  Pre-existing out of facility DNR order (yellow form or pink MOST form) - - - - - - -   Tobacco Social History   Tobacco Use  Smoking Status Every Day   Packs/day: 0.50   Years: 34.00   Pack years: 17.00   Types: Cigarettes  Smokeless Tobacco Never     Ready to quit: Yes Counseling given: Yes  Clinical Intake:  Pre-visit preparation completed: Yes  Pain Score: 8   How often do you need to have someone help you when you read instructions, pamphlets, or other written materials from your doctor or pharmacy?: 2 - Rarely What is the last grade level you completed in school?: High School  Past Medical History:  Diagnosis Date   Acute cholecystitis 04/17/2019   Anemia    Anxiety     Arthritis    Bipolar 1 disorder ([place])    Blood transfusion without reported diagnosis 2007   post bleeding childbirth   Cancer ([place])    left breast cancer 2015   COPD (chronic obstructive pulmonary disease) ([place])    pt reports she is not on oxygen   COVID-19 virus infection 06/07/2019   Depression    Diabetes mellitus without complication ([place])    gestational   Hand injury, right, initial encounter 03/19/2019   Heart murmur    as a child-not adult-no cardiac work up   History of radiation therapy 08/31/14-10/21/14   left breast/ left supraclavicular 50.4 Gy 28 fx, lef tposterior axillary boost 9.52 Gy 28 fx, left rbeast boost/ 10 Gy 5 fx   Personal history of chemotherapy    Personal history of radiation therapy    Rash and nonspecific skin eruption 12/27/2020   Past Surgical History:  Procedure Laterality Date   BREAST BIOPSY Left 04/01/14   BREAST BIOPSY Right 04/19/14   BREAST LUMPECTOMY WITH AXILLARY LYMPH NODE DISSECTION Bilateral 05/03/14   CHOLECYSTECTOMY N/A 04/18/2019   Procedure: LAPAROSCOPIC CHOLECYSTECTOMY WITH INTRAOPERATIVE CHOLANGIOGRAM;  Surgeon: Jovita Kussmaul, MD;  Location: [facility];  Service: General;  Laterality: N/A;   DILATION AND CURETTAGE OF UTERUS     after childbirth   MASS EXCISION Right 07/25/2021   Procedure: RIGHT AXILLARY MASS EXCISION;  Surgeon: Donne Hazel,  Rodman Key, MD;  Location: [place];  Service: General;  Laterality: Right;   MINOR BREAST BIOPSY Left 07/25/2021   Procedure: SKIN PUNCH BIOPSY LEFT BREAST;  Surgeon: [name], MD;  Location: [place];  Service: General;  Laterality: Left;   MULTIPLE TOOTH EXTRACTIONS     only 5 left   PORTACATH PLACEMENT Right 06/18/2014   Procedure: INSERTION PORT-A-CATH;  Surgeon: [name], MD;  Location: [place];  Service: General;  Laterality: Right;   TONSILLECTOMY AND ADENOIDECTOMY     TOTAL MASTECTOMY Left 08/28/2021   Procedure: LEFT TOTAL MASTECTOMY;  Surgeon: [name],  MD;  Location: [place];  Service: General;  Laterality: Left;  81 MINUTES- POST PER KELLY AND SHE WILL PUT IN ROOM   Family History  Problem Relation Age of Onset   Heart disease Father    Cancer Father        Prostate   Cancer Maternal Grandmother    Depression Maternal Grandfather    Cancer Paternal Grandmother    Stomach cancer Neg Hx    Esophageal cancer Neg Hx    Rectal cancer Neg Hx    Social History   Socioeconomic History   Marital status: Widowed    Spouse name: Not on file   Number of children: 5   Years of education: 12   Highest education level: High school graduate  Occupational History   Occupation: disabled  Tobacco Use   Smoking status: Every Day    Packs/day: 0.50    Years: 34.00    Pack years: 17.00    Types: Cigarettes   Smokeless tobacco: Never  Vaping Use   Vaping Use: Never used  Substance and Sexual Activity   Alcohol use: No   Drug use: No   Sexual activity: Not Currently  Other Topics Concern   Not on file  Social History Narrative   Patient lives in [place] with 3 of her children.    One adult son lives "up the road" and one lives in [place].   Patient is on disability.    Patient enjoys spending time with her family and watching TV.    Patient walks around her home for exercise.    Social Determinants of Health   Financial Resource Strain: Low Risk    Difficulty of Paying Living Expenses: Not hard at all  Food Insecurity: No Food Insecurity   Worried About Charity fundraiser in the Last Year: Never true   [place] in the Last Year: Never true  Transportation Needs: No Transportation Needs   Lack of Transportation (Medical): No   Lack of Transportation (Non-Medical): No  Physical Activity: Inactive   Days of Exercise per Week: 0 days   Minutes of Exercise per Session: 0 min  Stress: No Stress Concern Present   Feeling of Stress : Not at all  Social Connections: Moderately Integrated   Frequency of Communication with  Friends and Family: More than three times a week   Frequency of Social Gatherings with Friends and Family: More than three times a week   Attends Religious Services: More than 4 times per year   Active Member of [employer] or Organizations: Yes   Attends Archivist Meetings: More than 4 times per year   Marital Status: Widowed   Outpatient Encounter Medications as of 10/10/2021  Medication Sig   abemaciclib (VERZENIO) 50 MG tablet Take 1 tablet (50 mg total) by mouth 2 (two) times daily. Swallow tablets  whole. Do not chew, crush, or split tablets before swallowing.   acetaminophen (TYLENOL) 650 MG CR tablet Take 650 mg by mouth every 8 (eight) hours as needed for pain or fever.   albuterol (VENTOLIN HFA) 108 (90 Base) MCG/ACT inhaler USE 2 INHALATIONS BY MOUTH  EVERY 6 HOURS AS NEEDED FOR WHEEZING OR SHORTNESS OF  BREATH (Patient taking differently: 2 puffs every 6 (six) hours as needed for wheezing or shortness of breath.)   AMBULATORY NON FORMULARY MEDICATION Medication Name: Diltiazem 2%/ Lidocaine 5% Using your index finger, apply a small amount of medication inside the rectum up to your first knuckle/joint 4 daily x 8 weeks.   buPROPion (WELLBUTRIN XL) 150 MG 24 hr tablet Take 150 mg by mouth daily.   doxepin (SINEQUAN) 50 MG capsule Take 50 mg by mouth at bedtime.   FLUoxetine (PROZAC) 40 MG capsule Take 40 mg by mouth at bedtime.   Fulvestrant (FASLODEX IM) Inject 2 Doses into the muscle every 14 (fourteen) days. Every other Tuesday   gabapentin (NEURONTIN) 100 MG capsule Take 1 capsule (100 mg total) by mouth at bedtime.   hydrocortisone (ANUSOL-HC) 25 MG suppository Use every other night for 2-weeks until prescription is complete   lamoTRIgine (LAMICTAL) 200 MG tablet Take 200 mg by mouth at bedtime.   lidocaine-prilocaine (EMLA) cream Apply 1 application topically as needed.   methocarbamol (ROBAXIN) 750 MG tablet Take 1 tablet (750 mg total) by mouth 4 (four) times daily.    montelukast (SINGULAIR) 10 MG tablet Take 1 tablet (10 mg total) by mouth at bedtime.   ondansetron (ZOFRAN) 8 MG tablet TAKE 1 TABLET(8 MG) BY MOUTH EVERY 8 HOURS AS NEEDED FOR NAUSEA OR VOMITING   prazosin (MINIPRESS) 1 MG capsule Take 1 mg by mouth at bedtime.   prochlorperazine (COMPAZINE) 5 MG tablet TAKE 1 TABLET(5 MG) BY MOUTH EVERY 6 HOURS AS NEEDED FOR NAUSEA OR VOMITING. MAY TAKE 10 MG 2 TABLETS IF 5 MG NOT SUFFICIENT. MAY CAUSE   QUEtiapine (SEROQUEL) 400 MG tablet Take 400 mg by mouth at bedtime.    rosuvastatin (CRESTOR) 10 MG tablet Take 1 tablet (10 mg total) by mouth daily.   Spacer/Aero-Holding Chambers (AEROCHAMBER PLUS) inhaler Use with inhaler   Tiotropium Bromide Monohydrate (SPIRIVA RESPIMAT) 1.25 MCG/ACT AERS Inhale 2 puffs into the lungs daily.   traMADol (ULTRAM) 50 MG tablet Take 1-2 tablets (50-100 mg total) by mouth every 6 (six) hours as needed.   traMADol (ULTRAM) 50 MG tablet Take 1-2 tablets (50-100 mg total) by mouth every 6 (six) hours as needed.   loperamide (IMODIUM) 2 MG capsule Take 2 mg by mouth as needed for diarrhea or loose stools. Take 2 tablets (4 mg) with first sign of diarrhea, followed by 1 tablet (2 mg) with future occurrences of diarrhea. Take up to 8 tablets (16 mg) in 24 hours. (Patient not taking: Reported on 09/17/2021)   naproxen (NAPROSYN) 500 MG tablet Take 1 tablet (500 mg total) by mouth 2 (two) times daily with a meal. (Patient not taking: Reported on 09/17/2021)   pantoprazole (PROTONIX) 40 MG tablet Take 1 tablet (40 mg total) by mouth daily. (Patient not taking: Reported on 10/10/2021)   No facility-administered encounter medications on file as of 10/10/2021.   Activities of Daily Living In your present state of health, do you have any difficulty performing the following activities: 10/10/2021 08/24/2021  Hearing? N N  Vision? N Y  Difficulty concentrating or making decisions? N N  Walking  or climbing stairs? N N  Dressing or bathing?  N N  Doing errands, shopping? N N  Preparing Food and eating ? N -  Using the Toilet? N -  In the past six months, have you accidently leaked urine? N -  Do you have problems with loss of bowel control? N -  Managing your Medications? N -  Managing your Finances? N -  Housekeeping or managing your Housekeeping? N -  Some recent data might be hidden   Patient Care Team: Lurline Del, DO as PCP - General (Family Medicine) [name], MD as Attending Physician (Radiation Oncology) [name], MD as Consulting Physician (General Surgery) Causey, Charlestine Massed, NP as Nurse Practitioner (Hematology and Oncology) [name], RPH-CPP (Pharmacist) Benay Pike, MD as Consulting Physician (Hematology and Oncology)    Assessment:   This is a routine wellness examination for Chanae.  Exercise Activities and Dietary recommendations Current Exercise Habits: The patient does not participate in regular exercise at present, Exercise limited by: respiratory conditions(s);psychological condition(s)   Goals      Quit Smoking     1/2 PPD current everyday smoker.       Fall Risk Fall Risk  10/10/2021 03/10/2020 02/23/2020 03/06/2019 04/15/2018  Falls in the past year? 1 0 1 0 No  Number falls in past yr: 0 0 0 - -  Injury with Fall? 1 0 1 - -  Risk Factor Category  - - - - -  Risk for fall due to : Impaired balance/gait;Medication side effect - - - -  Risk for fall due to: Comment chemo treatment - - - -  Follow up Falls prevention discussed - - - -   Patient does report abnormal gait and balance due to current chemo treatment. Patient does not use any assistive devices at this time. Patient advised to scheduled an apt to discuss with PCP per insurance guidelines.  Is the patient's home free of loose throw rugs in walkways, pet beds, electrical cords, etc?   yes      Grab bars in the bathroom? yes      Handrails on the stairs?   yes      Adequate lighting?    yes  Patient rating of health (0-10) scale: 8  Depression Screen PHQ 2/9 Scores 10/10/2021 06/28/2021 04/11/2021 01/11/2021  PHQ - 2 Score 2 2 0 2  PHQ- 9 Score 13 13 0 13  Exception Documentation - - - -  Not completed - - - -    Cognitive Function 6CIT Screen 10/10/2021  What Year? 0 points  What month? 0 points  What time? 0 points  Count back from 20 0 points  Months in reverse 0 points  Repeat phrase 0 points  Total Score 0   Immunization History  Administered Date(s) Administered   Influenza Split 09/26/2012   Influenza,inj,Quad PF,6+ Mos 09/29/2015, 09/23/2017, 10/06/2018, 06/28/2021   PNEUMOCOCCAL CONJUGATE-20 07/05/2021   Pneumococcal Polysaccharide-23 03/08/2017   Td 09/29/2008   Covid-19 vaccine status: Patient declines vaccines. Education has been provided regarding the importance of this vaccine. Advised may receive this vaccine at local pharmacy or Health Dept. Aware to provide a copy of the vaccination record if obtained from local pharmacy or Health Dept. Verbalized acceptance and understanding.  Tetanus vaccine status: Due, Education has been provided regarding the importance of this vaccine. Advised may receive this vaccine at local pharmacy or Health Dept. Aware to provide a copy of the vaccination record if obtained from  local pharmacy or Health Dept. Verbalized acceptance and understanding.  Screening Tests Health Maintenance  Topic Date Due   COVID-19 Vaccine (1) Never done   Hepatitis C Screening  Never done   TETANUS/TDAP  09/29/2018   PAP SMEAR-Modifier  02/26/2019   COLONOSCOPY (Pts 45-38yrs Insurance coverage will need to be confirmed)  08/11/2024   Pneumococcal Vaccine 51-54 Years old  Completed   INFLUENZA VACCINE  Completed   HIV Screening  Completed   HPV VACCINES  Aged Out   Cancer Screenings: Lung: Low Dose CT Chest recommended if Age 64-80 years, 30 pack-year currently smoking OR have quit w/in 15years. Patient does qualify. Breast:  Up to  date on Mammogram? 05/24/2021 Up to date of Bone Density/Dexa? NA Colorectal: UTD  Additional Screenings: HIV Screening: Completed   Plan:  Schedule a PCP apt in the new year. Consider vaccines we discussed.  Continue to work on smoking cessation.   I have personally reviewed and noted the following in the patients chart:   Medical and social history Use of alcohol, tobacco or illicit drugs  Current medications and supplements Functional ability and status Nutritional status Physical activity Advanced directives List of other physicians Hospitalizations, surgeries, and ER visits in previous 12 months Vitals Screenings to include cognitive, depression, and falls Referrals and appointments  In addition, I have reviewed and discussed with patient certain preventive protocols, quality metrics, and best practice recommendations. A written personalized care plan for preventive services as well as general preventive health recommendations were provided to patient.  This visit was conducted virtually in the setting of the [place] pandemic.    Dorna Bloom, [place]  10/10/2021

## 2021-10-10 NOTE — Progress Notes (Signed)
Radiation Oncology         [phone] ________________________________  Outpatient Re-Consultation  Name: Kimberly [name] MRN: [id number]  Date: 10/11/2021  DOB: Aug 06, 1971  SW:FUXNATF, Thurmond Butts, DO  Magrinat, Virgie Dad, MD   REFERRING PHYSICIAN: Magrinat, Virgie Dad, MD  DIAGNOSIS: [phone]   ICD-10-CM   1. Recurrent breast cancer, left [facility])  C50.912 Ambulatory referral to Social Work    Pregnancy, urine    2. Malignant neoplasm of upper-outer quadrant of left breast in female, estrogen receptor positive ([place])  C50.412    Z17.0         pT4b, pN1a   Interval since last radiation: almost 7 years  Indication for treatment:  Curative       Radiation treatment dates:   08/31/2014-10/21/2014   Site/dose:    1) Left Breast / 50.4 Gy in 28 fractions 2) Left supraclavicular / 50.4 Gy in 28 fractions 3) Left Posterior axillary boost  / 9.52 Gy in 28 fractions 4) Left Breast Boost / 10 Gy in 5 fractions   CHIEF COMPLAINT: Here to discuss management of recurrent left breast cancer  HISTORY OF PRESENT ILLNESS::Kimberly [name] is a 50 y.o. female who returns today for re-evaluation of her recurrent left breast cancer. I last met with the patient for follow-up on 11/26/2014. Her recent recurrence of left breast cancer is detailed as follows.   On 05/24/21, the patient underwent diagnostic bilateral mammogram and ultrasound, prompted due to reports of new left breast skin lesions, associated palpable lumps in the upper left breast, and and a palpable lump in the left axilla. Diagnostic mammogram and US revealed a complex fluid collection within the right axilla, at a superficial depth, measuring 2.3 cm in the greatest dimension, presumed to be a complicated sebaceous cyst and possibly secondary to infection. Superficial skin lesions were also seen throughout the upper outer left breast. Otherwise, no evidence of malignancy was appreciated within wither breast.   Accordingly, the patient  underwent excision of the right axillary mass on 07/25/21 which revealed the mass to be a benign epidermoid cyst. However, a punch biopsy of the left breast skin performed showed metastatic carcinoma consistent with breast primary. ER status 95% positive with moderate staining intensity, PR status 95% positive with strong staining intensity; Her2 status negative.   The patient patient began fulvestrant on 08/15/2021, and abemaciclib on 08/29/2021 under the care of Dr. Jana Hakim. Per Dr. Jana Hakim, the patient has been tolerating these well well without any major complaints. She will stop this prior to RT  The patient opted to proceed with left breast mastectomy on the date of 08/28/21. 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 Nodal status of 1/3 biopsied lymph nodes positive for metastatic carcinoma.  No ECE. ER status: 90% positive with moderate-weak staining intensity; PR status 90% positive with moderate staining intensity, Her2 status negative; proliferation marker Ki67 at 2%; Grade 2.  Unfortunately, the patient presented to the ED on 09/17/21 with reports of postoperative pain at her lumpectomy incision site, and foul smelling drainage coming from her percutaneous drain. The patient also reported intermittent fevers at home, worsening pain, and recurrent maroon-colored stools (not associated). Upon evaluation, the incision site was noted as unremarkable, and the drainage in her drain  appeared serosanguineous and non-purulent. She was discharged that same day. Of note: CXR performed during ED course revealed low lung volumes without radiographic evidence of acute cardiopulmonary  disease.  The patient most recently followed up with Dr. Murvin Natal PA, [facility], on 09/26/21. During which time, the patient again reported increased pain at her mastectomy site, and was noted to be in acute distress due to her pain. Accordingly, the patient was given Ultram in addition to her existing pain medications. The patient will continue on abemaciclib until she starts radiation.  Other recent pertinent imaging includes:  --CT of the abdomen and pelvis on 06/10/21 showed no acute findings. --Staging head CT on 07/14/2021 showed no evidence of intracranial abnormalities.  --Restaging chest CT on 07/14/21 was also negative for metastatic disease.   Of note: the patient underwent EGD on 08/11/21 to evaluate the possible cause of her iron deficiency anemia. Pathology from the procedure showed chronic gastritis with intestinal metaplasia in the stomach, with no evidence of dysplasia or malignancy and no H. pylori noted.     PAST MEDICAL HISTORY:  has a past medical history of Acute cholecystitis (04/17/2019), Anemia, Anxiety, Arthritis, Bipolar 1 disorder ([place]), Blood transfusion without reported diagnosis (2007), Cancer ([place]), COPD (chronic obstructive pulmonary disease) ([place]), COVID-19 virus infection (06/07/2019), Depression, Diabetes mellitus without complication ([place]), Hand injury, right, initial encounter (03/19/2019), Heart murmur, History of radiation therapy (08/31/14-10/21/14), Personal history of chemotherapy, Personal history of radiation therapy, and Rash and nonspecific skin eruption (12/27/2020).    PAST SURGICAL HISTORY: Past Surgical History:  Procedure Laterality Date   BREAST BIOPSY Left 04/01/14   BREAST BIOPSY Right 04/19/14   BREAST LUMPECTOMY WITH AXILLARY LYMPH NODE DISSECTION Bilateral 05/03/14   CHOLECYSTECTOMY N/A 04/18/2019   Procedure: LAPAROSCOPIC CHOLECYSTECTOMY WITH INTRAOPERATIVE CHOLANGIOGRAM;  Surgeon: Jovita Kussmaul, MD;  Location: [facility];  Service:  General;  Laterality: N/A;   DILATION AND CURETTAGE OF UTERUS     after childbirth   MASS EXCISION Right 07/25/2021   Procedure: RIGHT AXILLARY MASS EXCISION;  Surgeon: [name], MD;  Location: [facility];  Service: General;  Laterality: Right;   MINOR BREAST BIOPSY Left 07/25/2021   Procedure: SKIN PUNCH BIOPSY LEFT BREAST;  Surgeon: [name], MD;  Location: [facility];  Service: General;  Laterality: Left;   MULTIPLE TOOTH EXTRACTIONS     only 5 left   PORTACATH PLACEMENT Right 06/18/2014   Procedure: INSERTION PORT-A-CATH;  Surgeon: [name], MD;  Location: [facility];  Service: General;  Laterality: Right;   TONSILLECTOMY AND ADENOIDECTOMY     TOTAL MASTECTOMY Left 08/28/2021   Procedure: LEFT TOTAL MASTECTOMY;  Surgeon: [name], MD;  Location: [facility];  Service: General;  Laterality: Left;  30 MINUTES- POST PER KELLY AND SHE WILL PUT IN ROOM    FAMILY HISTORY: family history includes Cancer in her father, maternal grandmother, and paternal grandmother; Depression in her maternal grandfather; Heart disease in her father.  SOCIAL HISTORY:  reports that she has been smoking cigarettes. She has a 17.00 pack-year smoking history. She has never used smokeless tobacco. She reports that she does not drink alcohol and does not use drugs.  ALLERGIES: Patient has no known allergies.  MEDICATIONS:  Current Outpatient Medications  Medication Sig Dispense Refill   abemaciclib (VERZENIO) 50 MG tablet Take 1 tablet (50 mg total) by mouth 2 (two) times daily. Swallow tablets whole. Do not chew, crush, or split tablets before swallowing. 60 tablet 6   acetaminophen (TYLENOL) 650  MG CR tablet Take 650 mg by mouth every 8 (eight) hours as needed for pain or fever.     albuterol (VENTOLIN HFA) 108 (90 Base) MCG/ACT inhaler USE 2 INHALATIONS BY MOUTH  EVERY 6 HOURS AS NEEDED FOR WHEEZING OR SHORTNESS OF  BREATH (Patient taking differently: 2 puffs every 6 (six) hours  as needed for wheezing or shortness of breath.) 34 g 3   AMBULATORY NON FORMULARY MEDICATION Medication Name: Diltiazem 2%/ Lidocaine 5% Using your index finger, apply a small amount of medication inside the rectum up to your first knuckle/joint 4 daily x 8 weeks. 38 g 1   buPROPion (WELLBUTRIN XL) 150 MG 24 hr tablet Take 150 mg by mouth daily.     doxepin (SINEQUAN) 50 MG capsule Take 50 mg by mouth at bedtime.     FLUoxetine (PROZAC) 40 MG capsule Take 40 mg by mouth at bedtime.     Fulvestrant (FASLODEX IM) Inject 2 Doses into the muscle every 14 (fourteen) days. Every other Tuesday     hydrocortisone (ANUSOL-HC) 25 MG suppository Use every other night for 2-weeks until prescription is complete 7 suppository 0   lamoTRIgine (LAMICTAL) 200 MG tablet Take 200 mg by mouth at bedtime.     lidocaine-prilocaine (EMLA) cream Apply 1 application topically as needed. 30 g 0   loperamide (IMODIUM) 2 MG capsule Take 2 mg by mouth as needed for diarrhea or loose stools. Take 2 tablets (4 mg) with first sign of diarrhea, followed by 1 tablet (2 mg) with future occurrences of diarrhea. Take up to 8 tablets (16 mg) in 24 hours.     methocarbamol (ROBAXIN) 750 MG tablet Take 1 tablet (750 mg total) by mouth 4 (four) times daily. 60 tablet 0   montelukast (SINGULAIR) 10 MG tablet Take 1 tablet (10 mg total) by mouth at bedtime. 30 tablet 3   ondansetron (ZOFRAN) 8 MG tablet TAKE 1 TABLET(8 MG) BY MOUTH EVERY 8 HOURS AS NEEDED FOR NAUSEA OR VOMITING 20 tablet 0   pantoprazole (PROTONIX) 40 MG tablet Take 1 tablet (40 mg total) by mouth daily. 30 tablet 0   prazosin (MINIPRESS) 1 MG capsule Take 1 mg by mouth at bedtime.     prochlorperazine (COMPAZINE) 5 MG tablet TAKE 1 TABLET(5 MG) BY MOUTH EVERY 6 HOURS AS NEEDED FOR NAUSEA OR VOMITING. MAY TAKE 10 MG 2 TABLETS IF 5 MG NOT SUFFICIENT. MAY CAUSE 30 tablet 0   QUEtiapine (SEROQUEL) 400 MG tablet Take 400 mg by mouth at bedtime.      rosuvastatin (CRESTOR) 10 MG  tablet Take 1 tablet (10 mg total) by mouth daily. 90 tablet 1   Spacer/Aero-Holding Chambers (AEROCHAMBER PLUS) inhaler Use with inhaler 1 each 2   Tiotropium Bromide Monohydrate (SPIRIVA RESPIMAT) 1.25 MCG/ACT AERS Inhale 2 puffs into the lungs daily. 4 each 0   gabapentin (NEURONTIN) 100 MG capsule Take 1 capsule (100 mg total) by mouth at bedtime. (Patient not taking: Reported on 10/11/2021) 30 capsule 0   naproxen (NAPROSYN) 500 MG tablet Take 1 tablet (500 mg total) by mouth 2 (two) times daily with a meal. (Patient not taking: Reported on 10/11/2021) 60 tablet 0   traMADol (ULTRAM) 50 MG tablet Take 1-2 tablets (50-100 mg total) by mouth every 6 (six) hours as needed. (Patient not taking: Reported on 10/11/2021) 60 tablet 0   traMADol (ULTRAM) 50 MG tablet Take 1-2 tablets (50-100 mg total) by mouth every 6 (six) hours as needed. (Patient not taking:  Reported on 10/11/2021) 120 tablet 0   No current facility-administered medications for this encounter.    REVIEW OF SYSTEMS: As above in HPI.   PHYSICAL EXAM:  height is _0  (1.753 m) and weight is 287 lb 2 oz (130.2 kg). Her temporal temperature is 96.6 F (35.9 C) (abnormal). Her blood pressure is 150/76 (abnormal) and her pulse is 92. Her respiration is 18 and oxygen saturation is 100%.   General: Alert and oriented, in no acute distress Musculoskeletal: decent ROM in left shoulder Psychiatric: Judgment and insight are intact. Affect is appropriate. Breasts: left mastectomy site healing satisfactorily without active oozing. No sign of locoregional recurrence postoperatively.  Erythema over chest wall is postoperative in nature.  No nodules on skin.   ECOG = 1  0 - Asymptomatic (Fully active, able to carry on all predisease activities without restriction)  1 - Symptomatic but completely ambulatory (Restricted in physically strenuous activity but ambulatory and able to carry out work of a light or sedentary nature. For example, light  housework, office work)  2 - Symptomatic, <50% in bed during the day (Ambulatory and capable of all self care but unable to carry out any work activities. Up and about more than 50% of waking hours)  3 - Symptomatic, >50% in bed, but not bedbound (Capable of only limited self-care, confined to bed or chair 50% or more of waking hours)  4 - Bedbound (Completely disabled. Cannot carry on any self-care. Totally confined to bed or chair)  5 - Death   Eustace Pen MM, Creech RH, Tormey DC, et al. [phone]). "Toxicity and response criteria of the [facility] Group". [place] Oncol. 5 (6): 649-55   LABORATORY DATA:  Lab Results  Component Value Date   WBC 3.6 (L) 10/10/2021   HGB 9.3 (L) 10/10/2021   HCT 30.0 (L) 10/10/2021   MCV 81.3 10/10/2021   PLT 223 10/10/2021   CMP     Component Value Date/Time   NA 139 10/10/2021 1344   NA 138 03/10/2020 1534   NA 139 01/19/2015 1422   K 3.8 10/10/2021 1344   K 3.8 01/19/2015 1422   CL 106 10/10/2021 1344   CO2 27 10/10/2021 1344   CO2 23 01/19/2015 1422   GLUCOSE 87 10/10/2021 1344   GLUCOSE 93 01/19/2015 1422   BUN 6 10/10/2021 1344   BUN 8 03/10/2020 1534   BUN 8.6 01/19/2015 1422   CREATININE 1.17 (H) 10/10/2021 1344   CREATININE 1.1 01/19/2015 1422   CALCIUM 9.3 10/10/2021 1344   CALCIUM 9.3 01/19/2015 1422   PROT 7.3 10/10/2021 1344   PROT 7.2 02/11/2020 1626   PROT 7.3 01/19/2015 1422   ALBUMIN 4.0 10/10/2021 1344   ALBUMIN 4.4 02/11/2020 1626   ALBUMIN 3.9 01/19/2015 1422   AST 8 (L) 10/10/2021 1344   AST 14 01/19/2015 1422   ALT 5 10/10/2021 1344   ALT 13 01/19/2015 1422   ALKPHOS 55 10/10/2021 1344   ALKPHOS 82 01/19/2015 1422   BILITOT 0.3 10/10/2021 1344   BILITOT 0.35 01/19/2015 1422   GFRNONAA 57 (L) 10/10/2021 1344   GFRAA 76 03/10/2020 1534         RADIOGRAPHY: DG Chest Port 1 View  Result Date: 09/17/2021 CLINICAL DATA:  50 year old female with history of intermittent fevers. Postoperative  pain. Status post left mastectomy on 09/01/2021. EXAM: PORTABLE CHEST 1 VIEW COMPARISON:  Chest x-ray 01/25/2021. FINDINGS: Right-sided subclavian single-lumen porta cath with tip terminating in the distal superior  vena cava. Lung volumes are low. No consolidative airspace disease. No pleural effusions. No pneumothorax. No pulmonary nodule or mass noted. Pulmonary vasculature and the cardiomediastinal silhouette are within normal limits. IMPRESSION: 1. Low lung volumes without radiographic evidence of acute cardiopulmonary disease. Electronically Signed   By: Vinnie Langton M.D.   On: 09/17/2021 06:11      IMPRESSION/PLAN: This is a lovely 50 yo woman with locally advanced recurrent left breast cancer, s/p mastectomy. She received radiation 7 years ago for her original breast cancer.  We discussed the risks, benefits, and side effects of radiotherapy. I recommend radiotherapy to the left chest wall and adjacent axillary nodes to reduce her risk of locoregional recurrence again.  This will involve reirradiation; I will not treat the nodes comprehensively due to risk of brachial plexopathy;  I will be mindful of the IM nodes and treat them if the heart /lung can be adequately spared.  We discussed that radiation would take approximately 6 weeks to complete.  Will plan RT today and start in early January. We spoke about acute effects including skin irritation and fatigue as well as much less common late effects including internal organ injury or irritation (lungs, ribs, heart, vessels, nerves). We spoke about the latest technology that is used to minimize the risk of late effects for patients undergoing radiotherapy for the second time to the breast or chest wall. No guarantees of treatment were given. The patient is enthusiastic about proceeding with treatment. I look forward to participating in the patient's care.  Consent signed today.  Urine preg test ordered for 10-24-20 out of caution to rule out pregnancy  prior to starting RT.  Social work for transportation questions.  On date of service, in total, I spent 30 minutes on this encounter. Patient was seen in person.   __________________________________________   [name], MD  This document serves as a record of services personally performed by [name], MD. It was created on her behalf by Roney Mans, a trained medical scribe. The creation of this record is based on the scribe's personal observations and the provider's statements to them. This document has been checked and approved by the attending provider.

## 2021-10-10 NOTE — Patient Instructions (Signed)
You spoke to Kimberly [name], Kimberly [name] over the phone for your annual wellness visit.  We discussed goals:   Goals      Quit Smoking     1/2 PPD current everyday smoker.       We also discussed recommended health maintenance. Please call our office and schedule a visit. As discussed, you are due for:  Health Maintenance  Topic Date Due   COVID-19 Vaccine (1) Never done   Hepatitis C Screening  Never done   TETANUS/TDAP  09/29/2018   PAP SMEAR-Modifier  02/26/2019   COLONOSCOPY (Pts 45-13yrs Insurance coverage will need to be confirmed)  08/11/2024   Pneumococcal Vaccine 58-51 Years old  Completed   INFLUENZA VACCINE  Completed   HIV Screening  Completed   HPV VACCINES  Aged Out   Schedule a PCP apt in the new year. Consider vaccines we discussed.  Continue to work on smoking cessation.   1-800-QUIT-NOW  Health Maintenance, Female Adopting a healthy lifestyle and getting preventive care are important in promoting health and wellness. Ask your health care provider about: The right schedule for you to have regular tests and exams. Things you can do on your own to prevent diseases and keep yourself healthy. What should I know about diet, weight, and exercise? Eat a healthy diet  Eat a diet that includes plenty of vegetables, fruits, low-fat dairy products, and lean protein. Do not eat a lot of foods that are high in solid fats, added sugars, or sodium. Maintain a healthy weight Body mass index (BMI) is used to identify weight problems. It estimates body fat based on height and weight. Your health care provider can help determine your BMI and help you achieve or maintain a healthy weight. Get regular exercise Get regular exercise. This is one of the most important things you can do for your health. Most adults should: Exercise for at least 150 minutes each week. The exercise should increase your heart rate and make you sweat (moderate-intensity exercise). Do strengthening exercises  at least twice a week. This is in addition to the moderate-intensity exercise. Spend less time sitting. Even light physical activity can be beneficial. Watch cholesterol and blood lipids Have your blood tested for lipids and cholesterol at 49 years of age, then have this test every 5 years. Have your cholesterol levels checked more often if: Your lipid or cholesterol levels are high. You are older than 49 years of age. You are at high risk for heart disease. What should I know about cancer screening? Depending on your health history and family history, you may need to have cancer screening at various ages. This may include screening for: Breast cancer. Cervical cancer. Colorectal cancer. Skin cancer. Lung cancer. What should I know about heart disease, diabetes, and high blood pressure? Blood pressure and heart disease High blood pressure causes heart disease and increases the risk of stroke. This is more likely to develop in people who have high blood pressure readings or are overweight. Have your blood pressure checked: Every 3-5 years if you are 65-40 years of age. Every year if you are 48 years old or older. Diabetes Have regular diabetes screenings. This checks your fasting blood sugar level. Have the screening done: Once every three years after age 27 if you are at a normal weight and have a low risk for diabetes. More often and at a younger age if you are overweight or have a high risk for diabetes. What should I know about preventing  infection? Hepatitis B If you have a higher risk for hepatitis B, you should be screened for this virus. Talk with your health care provider to find out if you are at risk for hepatitis B infection. Hepatitis C Testing is recommended for: Everyone born from [birth year] through 1964. Anyone with known risk factors for hepatitis C. Sexually transmitted infections (STIs) Get screened for STIs, including gonorrhea and chlamydia, if: You are sexually active  and are younger than 49 years of age. You are older than 49 years of age and your health care provider tells you that you are at risk for this type of infection. Your sexual activity has changed since you were last screened, and you are at increased risk for chlamydia or gonorrhea. Ask your health care provider if you are at risk. Ask your health care provider about whether you are at high risk for HIV. Your health care provider may recommend a prescription medicine to help prevent HIV infection. If you choose to take medicine to prevent HIV, you should first get tested for HIV. You should then be tested every 3 months for as long as you are taking the medicine. Pregnancy If you are about to stop having your period (premenopausal) and you may become pregnant, seek counseling before you get pregnant. Take 400 to 800 micrograms (mcg) of folic acid every day if you become pregnant. Ask for birth control (contraception) if you want to prevent pregnancy. Osteoporosis and menopause Osteoporosis is a disease in which the bones lose minerals and strength with aging. This can result in bone fractures. If you are 86 years old or older, or if you are at risk for osteoporosis and fractures, ask your health care provider if you should: Be screened for bone loss. Take a calcium or vitamin D supplement to lower your risk of fractures. Be given hormone replacement therapy (HRT) to treat symptoms of menopause. Follow these instructions at home: Alcohol use Do not drink alcohol if: Your health care provider tells you not to drink. You are pregnant, may be pregnant, or are planning to become pregnant. If you drink alcohol: Limit how much you have to: 0-1 drink a day. Know how much alcohol is in your drink. In the [place] one drink equals one 12 oz bottle of beer (355 mL), one 5 oz glass of wine (148 mL), or one 1 oz glass of hard liquor (44 mL). Lifestyle Do not use any products that contain nicotine or tobacco.  These products include cigarettes, chewing tobacco, and vaping devices, such as e-cigarettes. If you need help quitting, ask your health care provider. Do not use street drugs. Do not share needles. Ask your health care provider for help if you need support or information about quitting drugs. General instructions Schedule regular health, dental, and eye exams. Stay current with your vaccines. Tell your health care provider if: You often feel depressed. You have ever been abused or do not feel safe at home. Summary Adopting a healthy lifestyle and getting preventive care are important in promoting health and wellness. Follow your health care provider's instructions about healthy diet, exercising, and getting tested or screened for diseases. Follow your health care provider's instructions on monitoring your cholesterol and blood pressure. This information is not intended to replace advice given to you by your health care provider. Make sure you discuss any questions you have with your health care provider. Document Revised: 02/27/2021 Document Reviewed: 02/27/2021 Elsevier Patient Education  [facility]'s number  is [phone]. Please call with questions or concerns about what we discussed today.

## 2021-10-11 ENCOUNTER — Ambulatory Visit
Admission: RE | Admit: 2021-10-11 | Discharge: 2021-10-11 | Disposition: A | Discharge status: Home / Self Care | Payer: Medicare Other | Source: Ambulatory Visit | Attending: Radiation Oncology | Admitting: Radiation Oncology

## 2021-10-11 ENCOUNTER — Encounter: Payer: Self-pay | Admitting: Radiation Oncology

## 2021-10-11 VITALS — BP 150/76 | HR 92 | Temp 96.6°F | Resp 18 | Ht 69.0 in | Wt 287.1 lb

## 2021-10-11 DIAGNOSIS — F17210 Nicotine dependence, cigarettes, uncomplicated: Secondary | ICD-10-CM | POA: Diagnosis not present

## 2021-10-11 DIAGNOSIS — K31A0 Gastric intestinal metaplasia, unspecified: Secondary | ICD-10-CM | POA: Diagnosis not present

## 2021-10-11 DIAGNOSIS — Z79899 Other long term (current) drug therapy: Secondary | ICD-10-CM | POA: Diagnosis not present

## 2021-10-11 DIAGNOSIS — Z9012 Acquired absence of left breast and nipple: Secondary | ICD-10-CM | POA: Insufficient documentation

## 2021-10-11 DIAGNOSIS — R509 Fever, unspecified: Secondary | ICD-10-CM | POA: Insufficient documentation

## 2021-10-11 DIAGNOSIS — Z809 Family history of malignant neoplasm, unspecified: Secondary | ICD-10-CM | POA: Insufficient documentation

## 2021-10-11 DIAGNOSIS — G8918 Other acute postprocedural pain: Secondary | ICD-10-CM | POA: Diagnosis not present

## 2021-10-11 DIAGNOSIS — K2950 Unspecified chronic gastritis without bleeding: Secondary | ICD-10-CM | POA: Diagnosis not present

## 2021-10-11 DIAGNOSIS — C50412 Malignant neoplasm of upper-outer quadrant of left female breast: Secondary | ICD-10-CM | POA: Insufficient documentation

## 2021-10-11 DIAGNOSIS — Z170 Estrogen receptor positive status [ER+]: Secondary | ICD-10-CM | POA: Insufficient documentation

## 2021-10-11 DIAGNOSIS — C50912 Malignant neoplasm of unspecified site of left female breast: Secondary | ICD-10-CM

## 2021-10-11 NOTE — Progress Notes (Signed)
I have reviewed this visit and agree with the documentation.   

## 2021-10-11 NOTE — Progress Notes (Signed)
See MD note for nursing evaluation. °

## 2021-10-12 ENCOUNTER — Encounter: Payer: Self-pay | Admitting: Licensed Clinical Social Worker

## 2021-10-12 ENCOUNTER — Telehealth: Payer: Self-pay | Admitting: Licensed Clinical Social Worker

## 2021-10-12 NOTE — Progress Notes (Signed)
[place] CSW Progress Note  Clinical Education officer, museum contacted patient by phone to follow-up on transportation needs. Provided pt with information regarding transportation through her insurance [facility]), including contact information of [phone].  Patient will call to set up her rides.  CSW encouraged patient to reach out if the insurance is unable to help on any days.    Christeen Douglas , LCSW

## 2021-10-12 NOTE — Telephone Encounter (Signed)
[place] Work  Clinical Social Work was referred by Dr. Isidore Moos for transportation options.  Clinical Social Worker  attempted to contact pt by phone . No answer. VM full so unable to leave message.   Patient does have a transportation benefit through her insurance and may call the transportation number on the back of her card to set up rides.    [place], [place] Worker [employer]

## 2021-10-13 DIAGNOSIS — C50412 Malignant neoplasm of upper-outer quadrant of left female breast: Secondary | ICD-10-CM | POA: Diagnosis not present

## 2021-10-17 ENCOUNTER — Telehealth: Payer: Self-pay | Admitting: Pharmacist

## 2021-10-17 NOTE — Telephone Encounter (Signed)
Scheduled appointment per 12/20 los. Left message.

## 2021-10-24 ENCOUNTER — Inpatient Hospital Stay: Payer: Medicare Other | Admitting: Hematology and Oncology

## 2021-10-24 ENCOUNTER — Inpatient Hospital Stay: Payer: Medicare Other

## 2021-10-25 ENCOUNTER — Ambulatory Visit: Payer: Medicare Other | Admitting: Radiation Oncology

## 2021-10-25 ENCOUNTER — Telehealth: Payer: Self-pay | Admitting: *Deleted

## 2021-10-25 NOTE — Telephone Encounter (Signed)
CALLED PATIENT TO ASK TO COME IN FOR LAB TOMORROW @ 10 AM, PATIENT AGREED TO DO SO

## 2021-10-26 ENCOUNTER — Ambulatory Visit
Admission: RE | Admit: 2021-10-26 | Discharge: 2021-10-26 | Disposition: A | Discharge status: Home / Self Care | Payer: Medicare Other | Source: Ambulatory Visit | Attending: Radiation Oncology | Admitting: Radiation Oncology

## 2021-10-26 ENCOUNTER — Other Ambulatory Visit: Payer: Self-pay

## 2021-10-26 DIAGNOSIS — R5383 Other fatigue: Secondary | ICD-10-CM | POA: Diagnosis not present

## 2021-10-26 DIAGNOSIS — C50912 Malignant neoplasm of unspecified site of left female breast: Secondary | ICD-10-CM

## 2021-10-26 DIAGNOSIS — C50412 Malignant neoplasm of upper-outer quadrant of left female breast: Secondary | ICD-10-CM | POA: Diagnosis not present

## 2021-10-26 DIAGNOSIS — D509 Iron deficiency anemia, unspecified: Secondary | ICD-10-CM | POA: Insufficient documentation

## 2021-10-26 LAB — PREGNANCY, URINE: Preg Test, Ur: NEGATIVE

## 2021-10-27 ENCOUNTER — Ambulatory Visit
Admission: RE | Admit: 2021-10-27 | Discharge: 2021-10-27 | Disposition: A | Discharge status: Home / Self Care | Payer: Medicare Other | Source: Ambulatory Visit | Attending: Radiation Oncology | Admitting: Radiation Oncology

## 2021-10-27 DIAGNOSIS — C50912 Malignant neoplasm of unspecified site of left female breast: Secondary | ICD-10-CM | POA: Diagnosis not present

## 2021-10-27 DIAGNOSIS — R5383 Other fatigue: Secondary | ICD-10-CM | POA: Diagnosis not present

## 2021-10-27 DIAGNOSIS — C50412 Malignant neoplasm of upper-outer quadrant of left female breast: Secondary | ICD-10-CM | POA: Diagnosis not present

## 2021-10-27 DIAGNOSIS — D509 Iron deficiency anemia, unspecified: Secondary | ICD-10-CM | POA: Diagnosis not present

## 2021-10-30 ENCOUNTER — Other Ambulatory Visit: Payer: Self-pay

## 2021-10-30 ENCOUNTER — Ambulatory Visit
Admission: RE | Admit: 2021-10-30 | Discharge: 2021-10-30 | Disposition: A | Discharge status: Home / Self Care | Payer: Medicare Other | Source: Ambulatory Visit | Attending: Radiation Oncology | Admitting: Radiation Oncology

## 2021-10-30 DIAGNOSIS — C50912 Malignant neoplasm of unspecified site of left female breast: Secondary | ICD-10-CM | POA: Diagnosis not present

## 2021-10-30 DIAGNOSIS — D509 Iron deficiency anemia, unspecified: Secondary | ICD-10-CM | POA: Diagnosis not present

## 2021-10-30 DIAGNOSIS — C50412 Malignant neoplasm of upper-outer quadrant of left female breast: Secondary | ICD-10-CM | POA: Diagnosis not present

## 2021-10-30 DIAGNOSIS — R5383 Other fatigue: Secondary | ICD-10-CM | POA: Diagnosis not present

## 2021-10-30 MED ORDER — ALRA NON-METALLIC DEODORANT (RAD-ONC)
1.0000 "application " | Freq: Once | TOPICAL | Status: AC
  Administered 2021-10-30: 1 via TOPICAL

## 2021-10-30 MED ORDER — RADIAPLEXRX EX GEL: Freq: Once | CUTANEOUS | Status: AC

## 2021-10-30 NOTE — Progress Notes (Signed)
Pt here for patient teaching.    Pt given Radiation and You booklet, skin care instructions, Alra deodorant, Radiaplex gel, and Sonafine.    Reviewed areas of pertinence such as hair loss, skin changes, breast tenderness, and breast swelling .   Pt able to give teach back of to pat skin, use unscented/gentle soap, and drink plenty of water,apply Radiaplex bid, avoid applying anything to skin within 4 hours of treatment, avoid wearing an under wire bra, and to use an electric razor if they must shave.   Pt needs reinforcement and verbalizes understanding of information given and will contact nursing with any questions or concerns.    [email or web address]

## 2021-10-31 ENCOUNTER — Ambulatory Visit
Admission: RE | Admit: 2021-10-31 | Discharge: 2021-10-31 | Disposition: A | Discharge status: Home / Self Care | Payer: Medicare Other | Source: Ambulatory Visit | Attending: Radiation Oncology | Admitting: Radiation Oncology

## 2021-10-31 ENCOUNTER — Ambulatory Visit: Payer: Medicare Other | Admitting: Gastroenterology

## 2021-10-31 DIAGNOSIS — C50412 Malignant neoplasm of upper-outer quadrant of left female breast: Secondary | ICD-10-CM | POA: Diagnosis not present

## 2021-10-31 DIAGNOSIS — R5383 Other fatigue: Secondary | ICD-10-CM | POA: Diagnosis not present

## 2021-10-31 DIAGNOSIS — C50912 Malignant neoplasm of unspecified site of left female breast: Secondary | ICD-10-CM | POA: Diagnosis not present

## 2021-10-31 DIAGNOSIS — D509 Iron deficiency anemia, unspecified: Secondary | ICD-10-CM | POA: Diagnosis not present

## 2021-11-01 ENCOUNTER — Ambulatory Visit
Admission: RE | Admit: 2021-11-01 | Discharge: 2021-11-01 | Disposition: A | Discharge status: Home / Self Care | Payer: Medicare Other | Source: Ambulatory Visit | Attending: Radiation Oncology | Admitting: Radiation Oncology

## 2021-11-01 ENCOUNTER — Other Ambulatory Visit: Payer: Self-pay

## 2021-11-01 DIAGNOSIS — R5383 Other fatigue: Secondary | ICD-10-CM | POA: Diagnosis not present

## 2021-11-01 DIAGNOSIS — C50412 Malignant neoplasm of upper-outer quadrant of left female breast: Secondary | ICD-10-CM | POA: Diagnosis not present

## 2021-11-01 DIAGNOSIS — D509 Iron deficiency anemia, unspecified: Secondary | ICD-10-CM | POA: Diagnosis not present

## 2021-11-01 DIAGNOSIS — C50912 Malignant neoplasm of unspecified site of left female breast: Secondary | ICD-10-CM | POA: Diagnosis not present

## 2021-11-02 ENCOUNTER — Ambulatory Visit
Admission: RE | Admit: 2021-11-02 | Discharge: 2021-11-02 | Disposition: A | Discharge status: Home / Self Care | Payer: Medicare Other | Source: Ambulatory Visit | Attending: Radiation Oncology | Admitting: Radiation Oncology

## 2021-11-02 DIAGNOSIS — D509 Iron deficiency anemia, unspecified: Secondary | ICD-10-CM | POA: Diagnosis not present

## 2021-11-02 DIAGNOSIS — C50412 Malignant neoplasm of upper-outer quadrant of left female breast: Secondary | ICD-10-CM | POA: Diagnosis not present

## 2021-11-02 DIAGNOSIS — C50912 Malignant neoplasm of unspecified site of left female breast: Secondary | ICD-10-CM | POA: Diagnosis not present

## 2021-11-02 DIAGNOSIS — R5383 Other fatigue: Secondary | ICD-10-CM | POA: Diagnosis not present

## 2021-11-03 ENCOUNTER — Ambulatory Visit
Admission: RE | Admit: 2021-11-03 | Discharge: 2021-11-03 | Disposition: A | Discharge status: Home / Self Care | Payer: Medicare Other | Source: Ambulatory Visit | Attending: Radiation Oncology | Admitting: Radiation Oncology

## 2021-11-03 ENCOUNTER — Other Ambulatory Visit: Payer: Self-pay

## 2021-11-03 DIAGNOSIS — D509 Iron deficiency anemia, unspecified: Secondary | ICD-10-CM | POA: Diagnosis not present

## 2021-11-03 DIAGNOSIS — R5383 Other fatigue: Secondary | ICD-10-CM | POA: Diagnosis not present

## 2021-11-03 DIAGNOSIS — C50912 Malignant neoplasm of unspecified site of left female breast: Secondary | ICD-10-CM | POA: Diagnosis not present

## 2021-11-03 DIAGNOSIS — C50412 Malignant neoplasm of upper-outer quadrant of left female breast: Secondary | ICD-10-CM | POA: Diagnosis not present

## 2021-11-04 NOTE — Progress Notes (Signed)
[place]  [phone] [phone]    ID: Kimberly [name] DOB: 05-31-1971  MR#: [id number]  SUP#:[id number]  Patient Care Team: Kimberly Del, DO as PCP - General (Family Medicine) Kimberly Gibson, MD as Attending Physician (Radiation Oncology) Kimberly Bookbinder, MD as Consulting Physician (General Surgery) [name], Kimberly Massed, NP as Nurse Practitioner (Hematology and Oncology) Kimberly [name], RPH-CPP (Pharmacist) Kimberly Pike, MD as Consulting Physician (Hematology and Oncology) Kimberly Hun. 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  She continues on these with good tolerance.   On 08/28/2021 she underwent left mastectomy. Pathology from the procedure [id number]) showed: invasive lobular carcinoma, grade 2, spanning a fibrotic area measuring about 9 cm; ductal carcinoma in situ, low grade; lobular carcinoma in situ; carcinoma involves dermis of skin and nipple; all margins negative.  Of the three biopsied lymph nodes, one showed metastatic carcinoma (1/3).   REVIEW OF SYSTEMS: Review of Systems  Constitutional:  Negative for appetite change, chills, fatigue, fever and unexpected weight change.  HENT:   Negative for hearing loss, lump/mass and trouble swallowing.   Eyes:  Negative for eye problems and icterus.  Respiratory:  Negative for chest tightness, cough and shortness of breath.   Cardiovascular:  Negative for chest pain, leg swelling and palpitations.  Gastrointestinal:  Negative for abdominal distention, abdominal pain, constipation, diarrhea, nausea and vomiting.  Endocrine: Negative for hot flashes.  Genitourinary:  Negative for difficulty urinating.   Musculoskeletal:  Negative for  arthralgias.  Skin:  Negative for itching and rash.  Neurological:  Negative for dizziness, extremity weakness, headaches and numbness.  Hematological:  Negative for adenopathy. Does not bruise/bleed easily.  Psychiatric/Behavioral:  Negative for depression. The patient is not nervous/anxious.     COVID 19 VACCINATION STATUS:    BREAST CANCER HISTORY:  Oncology History Overview Note  The patient had bilateral screening mammography with tomography 03/19/2014. This was the patient's first ever mammography. Showed a possible mass in the left breast. Left diagnostic mammography and ultrasonography 04/01/2014 showed an irregular mass in the upper outer quadrant of the left breast with a possible satellite 1 cm anterior to it. On physical exam there was a firm palpable mass at the 1:00 position of the left breast 8 cm from the nipple. There was no palpable left axillary adenopathy. Ultrasound showed an irregular hypoechoic mass in the area in question measuring 1.4 cm. There was a 4 mm nodule located anterior to this. Ultrasound of the left axilla was benign.  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 The second mass was negative for malignancy. This was felt to be concordant.  On 04/08/2014 the patient underwent bilateral breast MRI. This showed a 9 mm enhancing mass in the subareolar area of the right breast in in the left breast, the previously noted mass measuring 1.3 cm. There were no morphologically abnormal lymph nodes  The right breast finding was followed up on 04/19/2014 with ultrasound which showed an intraductal soft tissue mass in the inferior subareolar worsening  of the right breast  measuring 8 mm. This was biopsied 04/19/2014 and showed ([id number]) ectatic duct, with no evidence of malignancy. Surgical excision was recommended.  Accordingly on 05/03/2014 the patient underwent right lumpectomy, showing an intraductal papilloma, with known malignancy identified. Left lumpectomy on the same day showed an invasive ductal carcinoma measuring 1.5 cm, with one of the 2 sentinel lymph nodes positive for carcinoma. There was no extracapsular extension. Margins were clear and ample. HER-2 was repeated and was again negative.  The patient's case was discussed in the multidisciplinary breast cancer conference 05/12/2014. An Oncotype had been previously requested and this showed a recurrent score of 22, in the intermediate range. The patient qualifies for the S1007 study and this will be discussed with her. Otherwise the standard recommendation would be chemotherapy followed by radiation followed by anti-estrogens. Oncotype score of 22 predicts a risk of outside the breast recurrence within 10 years of 13% if the patient's only systemic therapy is tamoxifen for 5 years.  (2) adjuvant chemotherapy with cyclophosphamide and docetaxel started 07/06/2014, patient tolerated chemotherapy very poorly and it was discontinued after one cycle.    (3) adjuvant radiation completed 10/21/2014 1) Left Breast / 50.4 Gy in 28 fractions 2) Left supraclavicular / 50.4 Gy in 28 fractions 3) Left Posterior axillary boost  / 9.52 Gy in 28 fractions 4) Left Breast Boost / 10 Gy in 5 fractions  (4) anastrozole started 01/19/2015-[name] informed me she never started this.   METASTATIC DISEASE to skin: September 2022 (5) mammography 05/24/2021 shows fluid collection in the right axilla, no obvious malignancy within each breast but superficial skin lesions throughout the upper outer left breast.  (A) CT scan of the abdomen and pelvis with contrast 06/10/2021 shows small low-attenuation liver lesions which  could be cysts but no definite evidence of metastatic disease.  (B) chest CT scan 07/14/2021 shows no evidence of metastatic disease  (C) CT head with and without contrast on 07/15/2021 shows no evidence of metastatic disease (D) biopsy left breast skin lesion 07/25/2021 shows metastatic carcinoma, estrogen and progesterone receptor positive, HER2 not amplified (1+)  (E) excision right axillary mass 07/25/2021 shows benign epidermoid cyst (E) CA 27-29 on 07/05/2021 is normal at 21.1  (6) iron deficiency anemia: On 07/05/2021 the ferritin was less than 4 and the iron saturation 3%; hemoglobin was 9.1 with an MCV of 71.3  (A) Venofer started on 07/18/2021, to be repeated x5   (B) GI evaluation 08/11/2021  (7) to start fulvestrant 09/13/2021  (A) to start abemaciclib 50 mg twice daily starting 09/13/2021  (8) status post left mastectomy 08/28/2021 for a pT3 pNX invasive lobular carcinoma, grade 2, with negative margins.   Recurrent breast cancer, left ([place])  04/14/2014 Initial Diagnosis   Recurrent breast cancer, left [facility])      PAST MEDICAL HISTORY: Past Medical History:  Diagnosis Date   Acute cholecystitis 04/17/2019   Anemia    Anxiety    Arthritis    Bipolar 1 disorder ([place])    Blood transfusion without reported diagnosis 2007   post bleeding childbirth   Cancer [facility])    left breast cancer 2015   COPD (chronic obstructive pulmonary disease) ([place])    pt reports she is not on oxygen   COVID-19 virus infection 06/07/2019   Depression    Diabetes mellitus without complication ([place])    gestational   Hand injury, right, initial encounter 03/19/2019   Heart murmur    as a child-not adult-no cardiac  work up   History of radiation therapy 08/31/14-10/21/14   left breast/ left supraclavicular 50.4 Gy 28 fx, lef tposterior axillary boost 9.52 Gy 28 fx, left rbeast boost/ 10 Gy 5 fx   Personal history of chemotherapy    Personal history of radiation therapy    Rash and nonspecific  skin eruption 12/27/2020    PAST SURGICAL HISTORY: Past Surgical History:  Procedure Laterality Date   BREAST BIOPSY Left 04/01/14   BREAST BIOPSY Right 04/19/14   BREAST LUMPECTOMY WITH AXILLARY LYMPH NODE DISSECTION Bilateral 05/03/14   CHOLECYSTECTOMY N/A 04/18/2019   Procedure: LAPAROSCOPIC CHOLECYSTECTOMY WITH INTRAOPERATIVE CHOLANGIOGRAM;  Surgeon: Jovita Kussmaul, MD;  Location: [facility];  Service: General;  Laterality: N/A;   DILATION AND CURETTAGE OF UTERUS     after childbirth   MASS EXCISION Right 07/25/2021   Procedure: RIGHT AXILLARY MASS EXCISION;  Surgeon: Kimberly Bookbinder, MD;  Location: [place];  Service: General;  Laterality: Right;   MINOR BREAST BIOPSY Left 07/25/2021   Procedure: SKIN PUNCH BIOPSY LEFT BREAST;  Surgeon: Kimberly Bookbinder, MD;  Location: [place];  Service: General;  Laterality: Left;   MULTIPLE TOOTH EXTRACTIONS     only 5 left   PORTACATH PLACEMENT Right 06/18/2014   Procedure: INSERTION PORT-A-CATH;  Surgeon: Kimberly Bookbinder, MD;  Location: [place];  Service: General;  Laterality: Right;   TONSILLECTOMY AND ADENOIDECTOMY     TOTAL MASTECTOMY Left 08/28/2021   Procedure: LEFT TOTAL MASTECTOMY;  Surgeon: Kimberly Bookbinder, MD;  Location: [facility];  Service: General;  Laterality: Left;  28 MINUTES- POST PER KELLY AND SHE WILL PUT IN ROOM    FAMILY HISTORY Family History  Problem Relation Age of Onset   Heart disease Father    Cancer Father        Prostate   Cancer Maternal Grandmother    Depression Maternal Grandfather    Cancer Paternal Grandmother    Stomach cancer Neg Hx    Esophageal cancer Neg Hx    Rectal cancer Neg Hx   The patient's father is currently living at age 82. The patient's mother was murdered at age 55 by the patient's mother's sister. The patient has 2 brothers, one sister. The patient's father hase prostate cancer, diagnosed at age 42. There is no history of breast or ovarian cancer in the family.   GYNECOLOGIC  HISTORY:  No LMP recorded. (Menstrual status: Perimenopausal). Menarche age 7, first live birth age 41, the patient is [place] P5. The patient has not had a period in the last 2 months but tells me she has tested for pregnancy x2 and she is not pregnant.   SOCIAL HISTORY:  she is disabled secondary to a nervous breakdown. She is widowed-- her husband Luiz Ochoa passed away several years ago.  She lives at home with her children, ages 31, 92, and 59.  Her son was recently named valedictorian of his high school class.    ADVANCED DIRECTIVES:  not in place    HEALTH MAINTENANCE: Social History   Tobacco Use   Smoking status: Every Day    Packs/day: 0.50    Years: 34.00    Pack years: 17.00    Types: Cigarettes   Smokeless tobacco: Never  Vaping Use   Vaping Use: Never used  Substance Use Topics   Alcohol use: No   Drug use: No     Colonoscopy:  PAP:  Bone density:  Lipid panel:  No Known Allergies  Current Outpatient Medications  Medication  Sig Dispense Refill   abemaciclib (VERZENIO) 50 MG tablet Take 1 tablet (50 mg total) by mouth 2 (two) times daily. Swallow tablets whole. Do not chew, crush, or split tablets before swallowing. 60 tablet 6   acetaminophen (TYLENOL) 650 MG CR tablet Take 650 mg by mouth every 8 (eight) hours as needed for pain or fever.     albuterol (VENTOLIN HFA) 108 (90 Base) MCG/ACT inhaler USE 2 INHALATIONS BY MOUTH  EVERY 6 HOURS AS NEEDED FOR WHEEZING OR SHORTNESS OF  BREATH (Patient taking differently: 2 puffs every 6 (six) hours as needed for wheezing or shortness of breath.) 34 g 3   AMBULATORY NON FORMULARY MEDICATION Medication Name: Diltiazem 2%/ Lidocaine 5% Using your index finger, apply a small amount of medication inside the rectum up to your first knuckle/joint 4 daily x 8 weeks. 38 g 1   buPROPion (WELLBUTRIN XL) 150 MG 24 hr tablet Take 150 mg by mouth daily.     doxepin (SINEQUAN) 50 MG capsule Take 50 mg by mouth at bedtime.     FLUoxetine  (PROZAC) 40 MG capsule Take 40 mg by mouth at bedtime.     Fulvestrant (FASLODEX IM) Inject 2 Doses into the muscle every 14 (fourteen) days. Every other Tuesday     hydrocortisone (ANUSOL-HC) 25 MG suppository Use every other night for 2-weeks until prescription is complete 7 suppository 0   lamoTRIgine (LAMICTAL) 200 MG tablet Take 200 mg by mouth at bedtime.     lidocaine-prilocaine (EMLA) cream Apply 1 application topically as needed. 30 g 0   loperamide (IMODIUM) 2 MG capsule Take 2 mg by mouth as needed for diarrhea or loose stools. Take 2 tablets (4 mg) with first sign of diarrhea, followed by 1 tablet (2 mg) with future occurrences of diarrhea. Take up to 8 tablets (16 mg) in 24 hours.     methocarbamol (ROBAXIN) 750 MG tablet Take 1 tablet (750 mg total) by mouth 4 (four) times daily. 60 tablet 0   montelukast (SINGULAIR) 10 MG tablet Take 1 tablet (10 mg total) by mouth at bedtime. 30 tablet 3   ondansetron (ZOFRAN) 8 MG tablet TAKE 1 TABLET(8 MG) BY MOUTH EVERY 8 HOURS AS NEEDED FOR NAUSEA OR VOMITING 20 tablet 0   pantoprazole (PROTONIX) 40 MG tablet Take 1 tablet (40 mg total) by mouth daily. 30 tablet 0   prazosin (MINIPRESS) 1 MG capsule Take 1 mg by mouth at bedtime.     prochlorperazine (COMPAZINE) 5 MG tablet TAKE 1 TABLET(5 MG) BY MOUTH EVERY 6 HOURS AS NEEDED FOR NAUSEA OR VOMITING. MAY TAKE 10 MG 2 TABLETS IF 5 MG NOT SUFFICIENT. MAY CAUSE 30 tablet 0   QUEtiapine (SEROQUEL) 400 MG tablet Take 400 mg by mouth at bedtime.      rosuvastatin (CRESTOR) 10 MG tablet Take 1 tablet (10 mg total) by mouth daily. 90 tablet 1   Spacer/Aero-Holding Chambers (AEROCHAMBER PLUS) inhaler Use with inhaler 1 each 2   Tiotropium Bromide Monohydrate (SPIRIVA RESPIMAT) 1.25 MCG/ACT AERS Inhale 2 puffs into the lungs daily. 4 each 0   No current facility-administered medications for this visit.    OBJECTIVE: middle-aged [place] woman who appears older than stated age 40:   11/06/21 0821   BP: 133/68  Pulse: 91  Resp: 18  Temp: (!) 97.5 F (36.4 C)  SpO2: 100%       Body mass index is 42.54 kg/m.      ECOG FS:2 - Symptomatic, <50% confined  to bed   GENERAL: Patient is a well appearing female in no acute distress HEENT:  Sclerae anicteric.  Oropharynx clear and moist. No ulcerations or evidence of oropharyngeal candidiasis. Neck is supple.  NODES:  No cervical, supraclavicular, or axillary lymphadenopathy palpated.  BREAST EXAM: Status post left mastectomy, no sign of local recurrence. Some radiation changes noted. LUNGS:  Clear to auscultation bilaterally.  No wheezes or rhonchi. HEART:  Regular rate and rhythm. No murmur appreciated. ABDOMEN:  Soft, nontender.  Positive, normoactive bowel sounds. No organomegaly palpated. MSK:  No focal spinal tenderness to palpation. Full range of motion bilaterally in the upper extremities. EXTREMITIES:  No peripheral edema.   SKIN:  Clear with no obvious rashes or skin changes. No nail dyscrasia. NEURO:  Nonfocal. Well oriented.  Appropriate affect.   LAB RESULTS:  CMP     Component Value Date/Time   NA 139 10/10/2021 1344   NA 138 03/10/2020 1534   NA 139 01/19/2015 1422   K 3.8 10/10/2021 1344   K 3.8 01/19/2015 1422   CL 106 10/10/2021 1344   CO2 27 10/10/2021 1344   CO2 23 01/19/2015 1422   GLUCOSE 87 10/10/2021 1344   GLUCOSE 93 01/19/2015 1422   BUN 6 10/10/2021 1344   BUN 8 03/10/2020 1534   BUN 8.6 01/19/2015 1422   CREATININE 1.17 (H) 10/10/2021 1344   CREATININE 1.1 01/19/2015 1422   CALCIUM 9.3 10/10/2021 1344   CALCIUM 9.3 01/19/2015 1422   PROT 7.3 10/10/2021 1344   PROT 7.2 02/11/2020 1626   PROT 7.3 01/19/2015 1422   ALBUMIN 4.0 10/10/2021 1344   ALBUMIN 4.4 02/11/2020 1626   ALBUMIN 3.9 01/19/2015 1422   AST 8 (L) 10/10/2021 1344   AST 14 01/19/2015 1422   ALT 5 10/10/2021 1344   ALT 13 01/19/2015 1422   ALKPHOS 55 10/10/2021 1344   ALKPHOS 82 01/19/2015 1422   BILITOT 0.3 10/10/2021  1344   BILITOT 0.35 01/19/2015 1422   GFRNONAA 57 (L) 10/10/2021 1344   GFRAA 76 03/10/2020 1534    I No results found for: SPEP  Lab Results  Component Value Date   WBC 6.5 11/06/2021   NEUTROABS 3.5 11/06/2021   HGB 8.9 (L) 11/06/2021   HCT 28.9 (L) 11/06/2021   MCV 81.9 11/06/2021   PLT 238 11/06/2021      Chemistry      Component Value Date/Time   NA 139 10/10/2021 1344   NA 138 03/10/2020 1534   NA 139 01/19/2015 1422   K 3.8 10/10/2021 1344   K 3.8 01/19/2015 1422   CL 106 10/10/2021 1344   CO2 27 10/10/2021 1344   CO2 23 01/19/2015 1422   BUN 6 10/10/2021 1344   BUN 8 03/10/2020 1534   BUN 8.6 01/19/2015 1422   CREATININE 1.17 (H) 10/10/2021 1344   CREATININE 1.1 01/19/2015 1422      Component Value Date/Time   CALCIUM 9.3 10/10/2021 1344   CALCIUM 9.3 01/19/2015 1422   ALKPHOS 55 10/10/2021 1344   ALKPHOS 82 01/19/2015 1422   AST 8 (L) 10/10/2021 1344   AST 14 01/19/2015 1422   ALT 5 10/10/2021 1344   ALT 13 01/19/2015 1422   BILITOT 0.3 10/10/2021 1344   BILITOT 0.35 01/19/2015 1422       Lab Results  Component Value Date   LABCA2 28 04/30/2014    No components found for: [id number]  No results for input(s): INR in the last 168 hours.  Urinalysis  Component Value Date/Time   COLORURINE YELLOW 03/07/2017 2111   APPEARANCEUR CLEAR 03/07/2017 2111   LABSPEC 1.016 03/07/2017 2111   PHURINE 7.0 03/07/2017 2111   GLUCOSEU NEGATIVE 03/07/2017 2111   HGBUR NEGATIVE 03/07/2017 2111   BILIRUBINUR negative 03/10/2020 1515   KETONESUR negative 03/10/2020 1515   Love 03/07/2017 2111   PROTEINUR =30 (A) 03/10/2020 1515   PROTEINUR NEGATIVE 03/07/2017 2111   UROBILINOGEN 0.2 03/10/2020 1515   UROBILINOGEN 0.2 07/10/2014 1951   NITRITE Negative 03/10/2020 1515   NITRITE NEGATIVE 03/07/2017 2111   LEUKOCYTESUR Negative 03/10/2020 1515    STUDIES: No results found.   ASSESSMENT/PLAN  51 y.o. BRCA negative [place] woman  status post left lumpectomy and sentinel lymph node sampling 05/03/2014 for a pT1c pN0, stage IA invasive ductal carcinoma, grade 2, estrogen receptor 83% positive, progesterone receptor 66% positive, with an MIB-1 of 14% and no HER-2 amplification.   She had an adjuvant TC followed by radiation.  She did not take antiestrogen therapy.  She is here with her recurrent breast cancer status post left mastectomy and is currently on adjuvant radiation.  She was started on abemaciclib and fulvestrant given high risk of recurrence.  She was tolerating abemaciclib reasonably well at the low-dose except for some nausea.  She currently continues on fulvestrant injections while undergoing radiation.  Today she denies any new complaints except for some nausea and some skin irritation from abemaciclib. We will continue to administer fulvestrant given very large tumor on the biopsy.  We will hold abemaciclib while she is undergoing radiation and restart it after she completes radiation.    She will be a good candidate for adjuvant abemaciclib for 2 years.  With regards to anemia, I will repeat iron panel and ferritin today and arrange for iron infusion if needed. She will return to clinic with me in 6 weeks.  Total encounter time: 30 minutes   Kimberly Pike, MD Medical Oncology and Hematology [facility] [address] [place], [place] [address] Tel. [phone]    Fax. [phone]     *Total Encounter Time as defined by the Centers for Medicare and Medicaid Services includes, in addition to the face-to-face time of a patient visit (documented in the note above) non-face-to-face time: obtaining and reviewing outside history, ordering and reviewing medications, tests or procedures, care coordination (communications with other health care professionals or caregivers) and documentation in the medical record.

## 2021-11-06 ENCOUNTER — Other Ambulatory Visit: Payer: Self-pay

## 2021-11-06 ENCOUNTER — Ambulatory Visit
Admission: RE | Admit: 2021-11-06 | Discharge: 2021-11-06 | Disposition: A | Discharge status: Home / Self Care | Payer: Medicare Other | Source: Ambulatory Visit | Attending: Radiation Oncology | Admitting: Radiation Oncology

## 2021-11-06 ENCOUNTER — Encounter: Payer: Self-pay | Admitting: Hematology and Oncology

## 2021-11-06 ENCOUNTER — Inpatient Hospital Stay: Payer: Medicare Other

## 2021-11-06 ENCOUNTER — Inpatient Hospital Stay (HOSPITAL_BASED_OUTPATIENT_CLINIC_OR_DEPARTMENT_OTHER): Payer: Medicare Other | Admitting: Hematology and Oncology

## 2021-11-06 VITALS — BP 139/63 | HR 74 | Temp 98.0°F | Resp 20

## 2021-11-06 VITALS — BP 133/68 | HR 91 | Temp 97.5°F | Resp 18 | Wt 288.1 lb

## 2021-11-06 DIAGNOSIS — Z170 Estrogen receptor positive status [ER+]: Secondary | ICD-10-CM

## 2021-11-06 DIAGNOSIS — Z5111 Encounter for antineoplastic chemotherapy: Secondary | ICD-10-CM | POA: Insufficient documentation

## 2021-11-06 DIAGNOSIS — D649 Anemia, unspecified: Secondary | ICD-10-CM | POA: Insufficient documentation

## 2021-11-06 DIAGNOSIS — D509 Iron deficiency anemia, unspecified: Secondary | ICD-10-CM

## 2021-11-06 DIAGNOSIS — C50912 Malignant neoplasm of unspecified site of left female breast: Secondary | ICD-10-CM | POA: Diagnosis not present

## 2021-11-06 DIAGNOSIS — C50412 Malignant neoplasm of upper-outer quadrant of left female breast: Secondary | ICD-10-CM | POA: Insufficient documentation

## 2021-11-06 DIAGNOSIS — R5383 Other fatigue: Secondary | ICD-10-CM | POA: Diagnosis not present

## 2021-11-06 LAB — CBC WITH DIFFERENTIAL (CANCER CENTER ONLY)
Abs Immature Granulocytes: 0.05 10*3/uL (ref 0.00–0.07)
Basophils Absolute: 0.1 10*3/uL (ref 0.0–0.1)
Basophils Relative: 1 %
Eosinophils Absolute: 0 10*3/uL (ref 0.0–0.5)
Eosinophils Relative: 0 %
HCT: 28.9 % — ABNORMAL LOW (ref 36.0–46.0)
Hemoglobin: 8.9 g/dL — ABNORMAL LOW (ref 12.0–15.0)
Immature Granulocytes: 1 %
Lymphocytes Relative: 35 %
Lymphs Abs: 2.3 10*3/uL (ref 0.7–4.0)
MCH: 25.2 pg — ABNORMAL LOW (ref 26.0–34.0)
MCHC: 30.8 g/dL (ref 30.0–36.0)
MCV: 81.9 fL (ref 80.0–100.0)
Monocytes Absolute: 0.6 10*3/uL (ref 0.1–1.0)
Monocytes Relative: 9 %
Neutro Abs: 3.5 10*3/uL (ref 1.7–7.7)
Neutrophils Relative %: 54 %
Platelet Count: 238 10*3/uL (ref 150–400)
RBC: 3.53 MIL/uL — ABNORMAL LOW (ref 3.87–5.11)
RDW: 19.9 % — ABNORMAL HIGH (ref 11.5–15.5)
WBC Count: 6.5 10*3/uL (ref 4.0–10.5)
nRBC: 0 % (ref 0.0–0.2)

## 2021-11-06 LAB — CMP (CANCER CENTER ONLY)
ALT: 9 U/L (ref 0–44)
AST: 13 U/L — ABNORMAL LOW (ref 15–41)
Albumin: 3.8 g/dL (ref 3.5–5.0)
Alkaline Phosphatase: 60 U/L (ref 38–126)
Anion gap: 8 (ref 5–15)
BUN: 9 mg/dL (ref 6–20)
CO2: 25 mmol/L (ref 22–32)
Calcium: 9 mg/dL (ref 8.9–10.3)
Chloride: 104 mmol/L (ref 98–111)
Creatinine: 0.98 mg/dL (ref 0.44–1.00)
GFR, Estimated: >60 mL/min (ref >60)
Glucose, Bld: 97 mg/dL (ref 70–99)
Potassium: 3.5 mmol/L (ref 3.5–5.1)
Sodium: 137 mmol/L (ref 135–145)
Total Bilirubin: 0.3 mg/dL (ref 0.3–1.2)
Total Protein: 7.3 g/dL (ref 6.5–8.1)

## 2021-11-06 MED ORDER — FULVESTRANT 250 MG/5ML IM SOSY
500.0000 mg | PREFILLED_SYRINGE | Freq: Once | INTRAMUSCULAR | Status: AC
  Administered 2021-11-06: 500 mg via INTRAMUSCULAR
  Filled 2021-11-06: qty 10

## 2021-11-06 MED ORDER — HEPARIN SOD (PORK) LOCK FLUSH 100 UNIT/ML IV SOLN
500.0000 [IU] | Freq: Once | INTRAVENOUS | Status: AC | PRN
  Administered 2021-11-06: 500 [IU]

## 2021-11-06 MED ORDER — SODIUM CHLORIDE 0.9% FLUSH
10.0000 mL | Freq: Once | INTRAVENOUS | Status: AC | PRN
  Administered 2021-11-06: 10 mL

## 2021-11-07 ENCOUNTER — Ambulatory Visit
Admission: RE | Admit: 2021-11-07 | Discharge: 2021-11-07 | Disposition: A | Discharge status: Home / Self Care | Payer: Medicare Other | Source: Ambulatory Visit | Attending: Radiation Oncology | Admitting: Radiation Oncology

## 2021-11-07 ENCOUNTER — Other Ambulatory Visit: Payer: Self-pay

## 2021-11-07 ENCOUNTER — Telehealth: Payer: Self-pay | Admitting: Licensed Clinical Social Worker

## 2021-11-07 ENCOUNTER — Inpatient Hospital Stay: Payer: Medicare Other

## 2021-11-07 DIAGNOSIS — C50412 Malignant neoplasm of upper-outer quadrant of left female breast: Secondary | ICD-10-CM

## 2021-11-07 DIAGNOSIS — R5383 Other fatigue: Secondary | ICD-10-CM

## 2021-11-07 DIAGNOSIS — D509 Iron deficiency anemia, unspecified: Secondary | ICD-10-CM

## 2021-11-07 DIAGNOSIS — Z170 Estrogen receptor positive status [ER+]: Secondary | ICD-10-CM

## 2021-11-07 DIAGNOSIS — C50912 Malignant neoplasm of unspecified site of left female breast: Secondary | ICD-10-CM | POA: Diagnosis not present

## 2021-11-07 LAB — TSH: TSH: 6.809 u[IU]/mL — ABNORMAL HIGH (ref 0.308–3.960)

## 2021-11-07 LAB — IRON AND IRON BINDING CAPACITY (CC-WL,HP ONLY)
Iron: 19 ug/dL — ABNORMAL LOW (ref 28–170)
Saturation Ratios: 4 % — ABNORMAL LOW (ref 10.4–31.8)
TIBC: 500 ug/dL — ABNORMAL HIGH (ref 250–450)
UIBC: 481 ug/dL — ABNORMAL HIGH (ref 148–442)

## 2021-11-07 LAB — FERRITIN: Ferritin: <4 ng/mL — ABNORMAL LOW (ref 11–307)

## 2021-11-07 NOTE — Progress Notes (Signed)
Patient reported trouble getting refills on 3 of her medications to help with sleep (bupropion, buspirone, and quetiapine). Called [facility] per patient's request, and spoke with pharmacy staff member. Staff member stated there were no more refills remaining on any of the 3 prescriptions. Updated patient that she would need to call prescriber's office to have them resend prescriptions for refill. Patient verbalized understanding and agreement.

## 2021-11-07 NOTE — Telephone Encounter (Signed)
[place] Work  Clinical Social Work was referred by radiation oncology for assessment of psychosocial needs.  Clinical Social Worker contacted patient by phone  to offer support and assess for needs.   Patient expressed need and appreciation for any financial resources. CSW referred to [employer] today and will meet with pt tomorrow to review other potential assistance. Patient reported that she receives disability and widow's pay (husband was in the [employer]). This is an emotional time of year for her as her husband died 6 years ago.     [place], [place] Worker [employer]

## 2021-11-07 NOTE — Addendum Note (Signed)
Addended by: Adaline Sill on: 11/07/2021 12:53 PM   Modules accepted: Orders

## 2021-11-08 ENCOUNTER — Ambulatory Visit
Admission: RE | Admit: 2021-11-08 | Discharge: 2021-11-08 | Disposition: A | Discharge status: Home / Self Care | Payer: Medicare Other | Source: Ambulatory Visit | Attending: Radiation Oncology | Admitting: Radiation Oncology

## 2021-11-08 ENCOUNTER — Inpatient Hospital Stay: Payer: Medicare Other | Admitting: Licensed Clinical Social Worker

## 2021-11-08 ENCOUNTER — Other Ambulatory Visit: Payer: Self-pay

## 2021-11-08 DIAGNOSIS — C50912 Malignant neoplasm of unspecified site of left female breast: Secondary | ICD-10-CM | POA: Diagnosis not present

## 2021-11-08 DIAGNOSIS — C50412 Malignant neoplasm of upper-outer quadrant of left female breast: Secondary | ICD-10-CM | POA: Diagnosis not present

## 2021-11-08 DIAGNOSIS — D509 Iron deficiency anemia, unspecified: Secondary | ICD-10-CM | POA: Diagnosis not present

## 2021-11-08 DIAGNOSIS — R5383 Other fatigue: Secondary | ICD-10-CM | POA: Diagnosis not present

## 2021-11-08 NOTE — Progress Notes (Signed)
[facility] CSW Progress Note ° °Clinical Social Worker met with patient to follow-up on resources for financial support.  ° °Applications:  °- [employer]- waiting on financial information °- [employer]- waiting on soc. Security benefits letter ° °Referred to: °- Food pantry- CSNP through [employer] ° °Resources given today: °- [place] fund- first installment °- Bag from food pantry °- Info on local resources (DSS, [employer], etc) ° °CSW also provided brief supportive listening. Pt became tearful speaking about her husband who died 6 years ago this Friday and how she was not able to be the mom she had hoped to be because of being ill for so long. She does already have therapy in place for this and declined further therapeutic support.  ° °SDOH Interventions   ° °Flowsheet Row Most Recent Value  °SDOH Interventions   °Food Insecurity Interventions Other (Comment)  [referral to CSNP through [employer]]  °Financial Strain Interventions Financial Counselor, Other (Comment)  [cancer foundations, DSS, [employer]]  ° °  °  ° ° ° ° E Z[place] LCSW °

## 2021-11-09 ENCOUNTER — Encounter: Payer: Self-pay | Admitting: Licensed Clinical Social Worker

## 2021-11-09 ENCOUNTER — Ambulatory Visit
Admission: RE | Admit: 2021-11-09 | Discharge: 2021-11-09 | Disposition: A | Discharge status: Home / Self Care | Payer: Medicare Other | Source: Ambulatory Visit | Attending: Radiation Oncology | Admitting: Radiation Oncology

## 2021-11-09 DIAGNOSIS — D509 Iron deficiency anemia, unspecified: Secondary | ICD-10-CM | POA: Diagnosis not present

## 2021-11-09 DIAGNOSIS — C50912 Malignant neoplasm of unspecified site of left female breast: Secondary | ICD-10-CM | POA: Diagnosis not present

## 2021-11-09 DIAGNOSIS — C50412 Malignant neoplasm of upper-outer quadrant of left female breast: Secondary | ICD-10-CM | POA: Diagnosis not present

## 2021-11-09 DIAGNOSIS — R5383 Other fatigue: Secondary | ICD-10-CM | POA: Diagnosis not present

## 2021-11-09 NOTE — Progress Notes (Signed)
[place] CSW Progress Note  Clinical Education officer, museum received information needed for foundation applications from patient. Submitted [employer] and [employer] applications today.    Christeen Douglas , LCSW

## 2021-11-10 ENCOUNTER — Ambulatory Visit
Admission: RE | Admit: 2021-11-10 | Discharge: 2021-11-10 | Disposition: A | Discharge status: Home / Self Care | Payer: Medicare Other | Source: Ambulatory Visit | Attending: Radiation Oncology | Admitting: Radiation Oncology

## 2021-11-10 ENCOUNTER — Other Ambulatory Visit: Payer: Self-pay

## 2021-11-10 DIAGNOSIS — C50412 Malignant neoplasm of upper-outer quadrant of left female breast: Secondary | ICD-10-CM | POA: Diagnosis not present

## 2021-11-10 DIAGNOSIS — R5383 Other fatigue: Secondary | ICD-10-CM | POA: Diagnosis not present

## 2021-11-10 DIAGNOSIS — D509 Iron deficiency anemia, unspecified: Secondary | ICD-10-CM | POA: Diagnosis not present

## 2021-11-10 DIAGNOSIS — C50912 Malignant neoplasm of unspecified site of left female breast: Secondary | ICD-10-CM | POA: Diagnosis not present

## 2021-11-13 ENCOUNTER — Telehealth: Payer: Self-pay | Admitting: Nutrition

## 2021-11-13 ENCOUNTER — Encounter: Payer: Self-pay | Admitting: Licensed Clinical Social Worker

## 2021-11-13 ENCOUNTER — Other Ambulatory Visit: Payer: Self-pay

## 2021-11-13 ENCOUNTER — Ambulatory Visit
Admission: RE | Admit: 2021-11-13 | Discharge: 2021-11-13 | Disposition: A | Discharge status: Home / Self Care | Payer: Medicare Other | Source: Ambulatory Visit | Attending: Radiation Oncology | Admitting: Radiation Oncology

## 2021-11-13 DIAGNOSIS — C50912 Malignant neoplasm of unspecified site of left female breast: Secondary | ICD-10-CM | POA: Diagnosis not present

## 2021-11-13 DIAGNOSIS — C50412 Malignant neoplasm of upper-outer quadrant of left female breast: Secondary | ICD-10-CM | POA: Diagnosis not present

## 2021-11-13 DIAGNOSIS — R5383 Other fatigue: Secondary | ICD-10-CM | POA: Diagnosis not present

## 2021-11-13 DIAGNOSIS — D509 Iron deficiency anemia, unspecified: Secondary | ICD-10-CM | POA: Diagnosis not present

## 2021-11-13 NOTE — Progress Notes (Signed)
[place] CSW Progress Note  Holiday representative met with patient to provide 2nd set of [employer]. Pt tearful as her husband's death anniversary and what would have been their wedding anniversary just passed.  She is also working out an issue with her insurance and pharmacy to get her medication for sleeping.    Pt was also matched for cleaning through [employer]. First of two cleanings will be today. Pt is looking forward to this.  Next set of [place] cards will be brought to front desk for pt next Monday    Christeen Douglas , LCSW

## 2021-11-13 NOTE — Telephone Encounter (Signed)
Patient interested in free cases of Ensure. Unfortunately, the high calorie, high protein supplement is not indicated for her.  I have provided her a variety of other samples and coupons if she would like to purchase.

## 2021-11-14 ENCOUNTER — Ambulatory Visit
Admission: RE | Admit: 2021-11-14 | Discharge: 2021-11-14 | Disposition: A | Discharge status: Home / Self Care | Payer: Medicare Other | Source: Ambulatory Visit | Attending: Radiation Oncology | Admitting: Radiation Oncology

## 2021-11-14 DIAGNOSIS — C50412 Malignant neoplasm of upper-outer quadrant of left female breast: Secondary | ICD-10-CM | POA: Diagnosis not present

## 2021-11-14 DIAGNOSIS — C50912 Malignant neoplasm of unspecified site of left female breast: Secondary | ICD-10-CM | POA: Diagnosis not present

## 2021-11-14 DIAGNOSIS — R5383 Other fatigue: Secondary | ICD-10-CM | POA: Diagnosis not present

## 2021-11-14 DIAGNOSIS — D509 Iron deficiency anemia, unspecified: Secondary | ICD-10-CM | POA: Diagnosis not present

## 2021-11-15 ENCOUNTER — Ambulatory Visit: Payer: Medicare Other

## 2021-11-15 ENCOUNTER — Telehealth: Payer: Self-pay | Admitting: Licensed Clinical Social Worker

## 2021-11-15 NOTE — Telephone Encounter (Signed)
[place] [employer]  CSW received TC from patient to update this CSW re: Engineer, materials up through [employer]. Pt states that the company was there less than 1.5 hrs and did minimal sweeping and mopping and that there really was not much benefit. CSW thanked pt for sharing this feedback and encouraged her to inform her patient manager at [employer] as well.   Christeen Douglas, LCSW

## 2021-11-16 ENCOUNTER — Ambulatory Visit: Payer: Medicare Other

## 2021-11-17 ENCOUNTER — Encounter: Payer: Self-pay | Admitting: General Practice

## 2021-11-17 ENCOUNTER — Ambulatory Visit
Admission: RE | Admit: 2021-11-17 | Discharge: 2021-11-17 | Disposition: A | Discharge status: Home / Self Care | Payer: Medicare Other | Source: Ambulatory Visit | Attending: Radiation Oncology | Admitting: Radiation Oncology

## 2021-11-17 ENCOUNTER — Ambulatory Visit: Payer: Medicare Other

## 2021-11-17 DIAGNOSIS — D509 Iron deficiency anemia, unspecified: Secondary | ICD-10-CM | POA: Diagnosis not present

## 2021-11-17 DIAGNOSIS — C50412 Malignant neoplasm of upper-outer quadrant of left female breast: Secondary | ICD-10-CM | POA: Diagnosis not present

## 2021-11-17 DIAGNOSIS — R5383 Other fatigue: Secondary | ICD-10-CM | POA: Diagnosis not present

## 2021-11-17 DIAGNOSIS — C50912 Malignant neoplasm of unspecified site of left female breast: Secondary | ICD-10-CM | POA: Diagnosis not present

## 2021-11-17 NOTE — Progress Notes (Signed)
[facility] Spiritual Care Note  Referred by Willodean Rosenthal for emotional support. Met Kimberly [name] in radiation subwait to bring her a "pick-me-up package" (goody bag of encouragement) and to introduce Spiritual Care as part of her support team. She was very receptive, has my direct number, and plans to reach out as needed/desired. We may schedule a visit in my office after a treatment to maximize convenience.   [place], [place], [facility] Pager [phone] Voicemail [phone]

## 2021-11-20 ENCOUNTER — Other Ambulatory Visit: Payer: Self-pay

## 2021-11-20 ENCOUNTER — Ambulatory Visit (HOSPITAL_COMMUNITY): Admission: RE | Admit: 2021-11-20 | Payer: Medicare Other | Source: Ambulatory Visit

## 2021-11-20 ENCOUNTER — Ambulatory Visit
Admission: RE | Admit: 2021-11-20 | Discharge: 2021-11-20 | Disposition: A | Discharge status: Home / Self Care | Payer: Medicare Other | Source: Ambulatory Visit | Attending: Radiation Oncology | Admitting: Radiation Oncology

## 2021-11-20 DIAGNOSIS — R5383 Other fatigue: Secondary | ICD-10-CM | POA: Diagnosis not present

## 2021-11-20 DIAGNOSIS — C50912 Malignant neoplasm of unspecified site of left female breast: Secondary | ICD-10-CM | POA: Diagnosis not present

## 2021-11-20 DIAGNOSIS — D509 Iron deficiency anemia, unspecified: Secondary | ICD-10-CM | POA: Diagnosis not present

## 2021-11-20 DIAGNOSIS — C50412 Malignant neoplasm of upper-outer quadrant of left female breast: Secondary | ICD-10-CM | POA: Diagnosis not present

## 2021-11-20 MED ORDER — RADIAPLEXRX EX GEL: Freq: Once | CUTANEOUS | Status: AC

## 2021-11-21 ENCOUNTER — Ambulatory Visit
Admission: RE | Admit: 2021-11-21 | Discharge: 2021-11-21 | Disposition: A | Discharge status: Home / Self Care | Payer: Medicare Other | Source: Ambulatory Visit | Attending: Radiation Oncology | Admitting: Radiation Oncology

## 2021-11-21 DIAGNOSIS — R5383 Other fatigue: Secondary | ICD-10-CM | POA: Diagnosis not present

## 2021-11-21 DIAGNOSIS — D509 Iron deficiency anemia, unspecified: Secondary | ICD-10-CM | POA: Diagnosis not present

## 2021-11-21 DIAGNOSIS — C50412 Malignant neoplasm of upper-outer quadrant of left female breast: Secondary | ICD-10-CM | POA: Diagnosis not present

## 2021-11-21 DIAGNOSIS — C50912 Malignant neoplasm of unspecified site of left female breast: Secondary | ICD-10-CM | POA: Diagnosis not present

## 2021-11-22 ENCOUNTER — Ambulatory Visit
Admission: RE | Admit: 2021-11-22 | Discharge: 2021-11-22 | Disposition: A | Discharge status: Home / Self Care | Payer: Medicare Other | Source: Ambulatory Visit | Attending: Radiation Oncology | Admitting: Radiation Oncology

## 2021-11-22 ENCOUNTER — Other Ambulatory Visit: Payer: Self-pay

## 2021-11-22 DIAGNOSIS — Z9012 Acquired absence of left breast and nipple: Secondary | ICD-10-CM | POA: Diagnosis not present

## 2021-11-22 DIAGNOSIS — C50912 Malignant neoplasm of unspecified site of left female breast: Secondary | ICD-10-CM | POA: Insufficient documentation

## 2021-11-22 DIAGNOSIS — C50412 Malignant neoplasm of upper-outer quadrant of left female breast: Secondary | ICD-10-CM | POA: Diagnosis not present

## 2021-11-23 ENCOUNTER — Ambulatory Visit
Admission: RE | Admit: 2021-11-23 | Discharge: 2021-11-23 | Disposition: A | Discharge status: Home / Self Care | Payer: Medicare Other | Source: Ambulatory Visit | Attending: Radiation Oncology | Admitting: Radiation Oncology

## 2021-11-23 DIAGNOSIS — C50912 Malignant neoplasm of unspecified site of left female breast: Secondary | ICD-10-CM | POA: Diagnosis not present

## 2021-11-23 DIAGNOSIS — Z9012 Acquired absence of left breast and nipple: Secondary | ICD-10-CM | POA: Diagnosis not present

## 2021-11-23 DIAGNOSIS — C50412 Malignant neoplasm of upper-outer quadrant of left female breast: Secondary | ICD-10-CM | POA: Diagnosis not present

## 2021-11-24 ENCOUNTER — Ambulatory Visit
Admission: RE | Admit: 2021-11-24 | Discharge: 2021-11-24 | Disposition: A | Discharge status: Home / Self Care | Payer: Medicare Other | Source: Ambulatory Visit | Attending: Radiation Oncology | Admitting: Radiation Oncology

## 2021-11-24 ENCOUNTER — Other Ambulatory Visit: Payer: Self-pay

## 2021-11-24 ENCOUNTER — Ambulatory Visit: Payer: Medicare Other

## 2021-11-24 DIAGNOSIS — C50912 Malignant neoplasm of unspecified site of left female breast: Secondary | ICD-10-CM | POA: Diagnosis not present

## 2021-11-24 DIAGNOSIS — C50412 Malignant neoplasm of upper-outer quadrant of left female breast: Secondary | ICD-10-CM | POA: Diagnosis not present

## 2021-11-24 DIAGNOSIS — Z9012 Acquired absence of left breast and nipple: Secondary | ICD-10-CM | POA: Diagnosis not present

## 2021-11-27 ENCOUNTER — Other Ambulatory Visit: Payer: Self-pay

## 2021-11-27 ENCOUNTER — Ambulatory Visit
Admission: RE | Admit: 2021-11-27 | Discharge: 2021-11-27 | Disposition: A | Discharge status: Home / Self Care | Payer: Medicare Other | Source: Ambulatory Visit | Attending: Radiation Oncology | Admitting: Radiation Oncology

## 2021-11-27 ENCOUNTER — Other Ambulatory Visit: Payer: Self-pay | Admitting: Radiation Oncology

## 2021-11-27 DIAGNOSIS — Z9012 Acquired absence of left breast and nipple: Secondary | ICD-10-CM | POA: Diagnosis not present

## 2021-11-27 DIAGNOSIS — C50912 Malignant neoplasm of unspecified site of left female breast: Secondary | ICD-10-CM | POA: Diagnosis not present

## 2021-11-27 DIAGNOSIS — C50412 Malignant neoplasm of upper-outer quadrant of left female breast: Secondary | ICD-10-CM | POA: Diagnosis not present

## 2021-11-27 MED ORDER — ONDANSETRON HCL 8 MG PO TABS: 8.0000 mg | ORAL_TABLET | Freq: Three times a day (TID) | ORAL | 0 refills | Status: DC | PRN

## 2021-11-28 ENCOUNTER — Ambulatory Visit
Admission: RE | Admit: 2021-11-28 | Discharge: 2021-11-28 | Disposition: A | Discharge status: Home / Self Care | Payer: Medicare Other | Source: Ambulatory Visit | Attending: Radiation Oncology | Admitting: Radiation Oncology

## 2021-11-28 DIAGNOSIS — Z9012 Acquired absence of left breast and nipple: Secondary | ICD-10-CM | POA: Diagnosis not present

## 2021-11-28 DIAGNOSIS — C50412 Malignant neoplasm of upper-outer quadrant of left female breast: Secondary | ICD-10-CM | POA: Diagnosis not present

## 2021-11-28 DIAGNOSIS — C50912 Malignant neoplasm of unspecified site of left female breast: Secondary | ICD-10-CM | POA: Diagnosis not present

## 2021-11-29 ENCOUNTER — Other Ambulatory Visit: Payer: Self-pay

## 2021-11-29 ENCOUNTER — Ambulatory Visit
Admission: RE | Admit: 2021-11-29 | Discharge: 2021-11-29 | Disposition: A | Discharge status: Home / Self Care | Payer: Medicare Other | Source: Ambulatory Visit | Attending: Radiation Oncology | Admitting: Radiation Oncology

## 2021-11-29 DIAGNOSIS — C50912 Malignant neoplasm of unspecified site of left female breast: Secondary | ICD-10-CM | POA: Diagnosis not present

## 2021-11-29 DIAGNOSIS — C50412 Malignant neoplasm of upper-outer quadrant of left female breast: Secondary | ICD-10-CM | POA: Diagnosis not present

## 2021-11-29 DIAGNOSIS — Z9012 Acquired absence of left breast and nipple: Secondary | ICD-10-CM | POA: Diagnosis not present

## 2021-11-30 ENCOUNTER — Ambulatory Visit
Admission: RE | Admit: 2021-11-30 | Discharge: 2021-11-30 | Disposition: A | Discharge status: Home / Self Care | Payer: Medicare Other | Source: Ambulatory Visit | Attending: Radiation Oncology | Admitting: Radiation Oncology

## 2021-11-30 DIAGNOSIS — C50412 Malignant neoplasm of upper-outer quadrant of left female breast: Secondary | ICD-10-CM | POA: Diagnosis not present

## 2021-11-30 DIAGNOSIS — Z9012 Acquired absence of left breast and nipple: Secondary | ICD-10-CM | POA: Diagnosis not present

## 2021-11-30 DIAGNOSIS — C50912 Malignant neoplasm of unspecified site of left female breast: Secondary | ICD-10-CM | POA: Diagnosis not present

## 2021-12-01 ENCOUNTER — Ambulatory Visit: Payer: Medicare Other

## 2021-12-01 ENCOUNTER — Ambulatory Visit
Admission: RE | Admit: 2021-12-01 | Discharge: 2021-12-01 | Disposition: A | Discharge status: Home / Self Care | Payer: Medicare Other | Source: Ambulatory Visit | Attending: Radiation Oncology | Admitting: Radiation Oncology

## 2021-12-01 ENCOUNTER — Other Ambulatory Visit: Payer: Self-pay

## 2021-12-01 DIAGNOSIS — C50412 Malignant neoplasm of upper-outer quadrant of left female breast: Secondary | ICD-10-CM | POA: Diagnosis not present

## 2021-12-01 DIAGNOSIS — C50912 Malignant neoplasm of unspecified site of left female breast: Secondary | ICD-10-CM | POA: Diagnosis not present

## 2021-12-01 DIAGNOSIS — Z9012 Acquired absence of left breast and nipple: Secondary | ICD-10-CM | POA: Diagnosis not present

## 2021-12-04 ENCOUNTER — Ambulatory Visit: Payer: Medicare Other

## 2021-12-04 ENCOUNTER — Other Ambulatory Visit: Payer: Self-pay

## 2021-12-04 ENCOUNTER — Ambulatory Visit
Admission: RE | Admit: 2021-12-04 | Discharge: 2021-12-04 | Disposition: A | Discharge status: Home / Self Care | Payer: Medicare Other | Source: Ambulatory Visit | Attending: Radiation Oncology | Admitting: Radiation Oncology

## 2021-12-04 DIAGNOSIS — C50912 Malignant neoplasm of unspecified site of left female breast: Secondary | ICD-10-CM | POA: Diagnosis not present

## 2021-12-04 DIAGNOSIS — C50412 Malignant neoplasm of upper-outer quadrant of left female breast: Secondary | ICD-10-CM | POA: Diagnosis not present

## 2021-12-04 DIAGNOSIS — Z9012 Acquired absence of left breast and nipple: Secondary | ICD-10-CM

## 2021-12-04 MED ORDER — SILVER SULFADIAZINE 1 % EX CREA: TOPICAL_CREAM | Freq: Two times a day (BID) | CUTANEOUS | Status: DC

## 2021-12-04 MED ORDER — SONAFINE EX EMUL
1.0000 "application " | Freq: Two times a day (BID) | CUTANEOUS | Status: DC
  Administered 2021-12-04: 1 via TOPICAL

## 2021-12-05 ENCOUNTER — Telehealth: Payer: Self-pay | Admitting: Pharmacist

## 2021-12-05 ENCOUNTER — Inpatient Hospital Stay: Payer: Medicare Other

## 2021-12-05 ENCOUNTER — Ambulatory Visit: Payer: Medicare Other

## 2021-12-05 ENCOUNTER — Ambulatory Visit
Admission: RE | Admit: 2021-12-05 | Discharge: 2021-12-05 | Disposition: A | Discharge status: Home / Self Care | Payer: Medicare Other | Source: Ambulatory Visit | Attending: Radiation Oncology | Admitting: Radiation Oncology

## 2021-12-05 ENCOUNTER — Inpatient Hospital Stay: Payer: Medicare Other | Admitting: Pharmacist

## 2021-12-05 DIAGNOSIS — C50912 Malignant neoplasm of unspecified site of left female breast: Secondary | ICD-10-CM | POA: Diagnosis not present

## 2021-12-05 DIAGNOSIS — C50412 Malignant neoplasm of upper-outer quadrant of left female breast: Secondary | ICD-10-CM | POA: Diagnosis not present

## 2021-12-05 DIAGNOSIS — Z9012 Acquired absence of left breast and nipple: Secondary | ICD-10-CM | POA: Diagnosis not present

## 2021-12-05 NOTE — Telephone Encounter (Signed)
Rescheduled appointment per provider. Patient aware.  

## 2021-12-06 ENCOUNTER — Inpatient Hospital Stay: Payer: Medicare Other

## 2021-12-06 ENCOUNTER — Encounter: Payer: Self-pay | Admitting: Adult Health

## 2021-12-06 ENCOUNTER — Ambulatory Visit
Admission: RE | Admit: 2021-12-06 | Discharge: 2021-12-06 | Disposition: A | Discharge status: Home / Self Care | Payer: Medicare Other | Source: Ambulatory Visit | Attending: Radiation Oncology | Admitting: Radiation Oncology

## 2021-12-06 ENCOUNTER — Encounter: Payer: Self-pay | Admitting: Radiation Oncology

## 2021-12-06 ENCOUNTER — Inpatient Hospital Stay: Payer: Medicare Other | Attending: Oncology

## 2021-12-06 ENCOUNTER — Other Ambulatory Visit: Payer: Self-pay

## 2021-12-06 ENCOUNTER — Inpatient Hospital Stay: Payer: Medicare Other | Admitting: Pharmacist

## 2021-12-06 VITALS — BP 100/83 | HR 93 | Temp 98.6°F | Resp 20

## 2021-12-06 VITALS — BP 95/63 | HR 86

## 2021-12-06 DIAGNOSIS — Z923 Personal history of irradiation: Secondary | ICD-10-CM | POA: Insufficient documentation

## 2021-12-06 DIAGNOSIS — Z9012 Acquired absence of left breast and nipple: Secondary | ICD-10-CM | POA: Insufficient documentation

## 2021-12-06 DIAGNOSIS — C50412 Malignant neoplasm of upper-outer quadrant of left female breast: Secondary | ICD-10-CM | POA: Diagnosis not present

## 2021-12-06 DIAGNOSIS — Z95828 Presence of other vascular implants and grafts: Secondary | ICD-10-CM

## 2021-12-06 DIAGNOSIS — C50912 Malignant neoplasm of unspecified site of left female breast: Secondary | ICD-10-CM

## 2021-12-06 DIAGNOSIS — D509 Iron deficiency anemia, unspecified: Secondary | ICD-10-CM

## 2021-12-06 DIAGNOSIS — Z5111 Encounter for antineoplastic chemotherapy: Secondary | ICD-10-CM | POA: Insufficient documentation

## 2021-12-06 DIAGNOSIS — Z79818 Long term (current) use of other agents affecting estrogen receptors and estrogen levels: Secondary | ICD-10-CM | POA: Insufficient documentation

## 2021-12-06 LAB — CMP (CANCER CENTER ONLY)
ALT: 7 U/L (ref 0–44)
AST: 10 U/L — ABNORMAL LOW (ref 15–41)
Albumin: 4.2 g/dL (ref 3.5–5.0)
Alkaline Phosphatase: 61 U/L (ref 38–126)
Anion gap: 5 (ref 5–15)
BUN: 9 mg/dL (ref 6–20)
CO2: 29 mmol/L (ref 22–32)
Calcium: 9.3 mg/dL (ref 8.9–10.3)
Chloride: 103 mmol/L (ref 98–111)
Creatinine: 0.92 mg/dL (ref 0.44–1.00)
GFR, Estimated: >60 mL/min (ref >60)
Glucose, Bld: 90 mg/dL (ref 70–99)
Potassium: 3.9 mmol/L (ref 3.5–5.1)
Sodium: 137 mmol/L (ref 135–145)
Total Bilirubin: 0.3 mg/dL (ref 0.3–1.2)
Total Protein: 7.4 g/dL (ref 6.5–8.1)

## 2021-12-06 LAB — CBC WITH DIFFERENTIAL (CANCER CENTER ONLY)
Abs Immature Granulocytes: 0.05 10*3/uL (ref 0.00–0.07)
Basophils Absolute: 0 10*3/uL (ref 0.0–0.1)
Basophils Relative: 1 %
Eosinophils Absolute: 0 10*3/uL (ref 0.0–0.5)
Eosinophils Relative: 0 %
HCT: 26.7 % — ABNORMAL LOW (ref 36.0–46.0)
Hemoglobin: 8 g/dL — ABNORMAL LOW (ref 12.0–15.0)
Immature Granulocytes: 1 %
Lymphocytes Relative: 24 %
Lymphs Abs: 1.6 10*3/uL (ref 0.7–4.0)
MCH: 23.8 pg — ABNORMAL LOW (ref 26.0–34.0)
MCHC: 30 g/dL (ref 30.0–36.0)
MCV: 79.5 fL — ABNORMAL LOW (ref 80.0–100.0)
Monocytes Absolute: 0.9 10*3/uL (ref 0.1–1.0)
Monocytes Relative: 13 %
Neutro Abs: 4.1 10*3/uL (ref 1.7–7.7)
Neutrophils Relative %: 61 %
Platelet Count: 211 10*3/uL (ref 150–400)
RBC: 3.36 MIL/uL — ABNORMAL LOW (ref 3.87–5.11)
RDW: 18.6 % — ABNORMAL HIGH (ref 11.5–15.5)
WBC Count: 6.7 10*3/uL (ref 4.0–10.5)
nRBC: 0 % (ref 0.0–0.2)

## 2021-12-06 MED ORDER — PROCHLORPERAZINE MALEATE 5 MG PO TABS: ORAL_TABLET | ORAL | 2 refills | Status: DC

## 2021-12-06 MED ORDER — SODIUM CHLORIDE 0.9% FLUSH
10.0000 mL | INTRAVENOUS | Status: DC | PRN
  Administered 2021-12-06: 10 mL via INTRAVENOUS

## 2021-12-06 MED ORDER — SODIUM CHLORIDE 0.9% FLUSH
10.0000 mL | INTRAVENOUS | Status: AC | PRN
  Administered 2021-12-06: 10 mL

## 2021-12-06 MED ORDER — SODIUM CHLORIDE 0.9 % IV SOLN
200.0000 mg | Freq: Once | INTRAVENOUS | Status: AC
  Administered 2021-12-06: 200 mg via INTRAVENOUS
  Filled 2021-12-06: qty 200

## 2021-12-06 MED ORDER — HEPARIN SOD (PORK) LOCK FLUSH 100 UNIT/ML IV SOLN
500.0000 [IU] | Freq: Once | INTRAVENOUS | Status: AC
  Administered 2021-12-06: 500 [IU] via INTRAVENOUS

## 2021-12-06 MED ORDER — SODIUM CHLORIDE 0.9 % IV SOLN: INTRAVENOUS | Status: DC

## 2021-12-06 MED ORDER — FULVESTRANT 250 MG/5ML IM SOSY
500.0000 mg | PREFILLED_SYRINGE | Freq: Once | INTRAMUSCULAR | Status: AC
  Administered 2021-12-06: 500 mg via INTRAMUSCULAR
  Filled 2021-12-06: qty 10

## 2021-12-06 MED ORDER — ONDANSETRON HCL 8 MG PO TABS: ORAL_TABLET | ORAL | 2 refills | Status: DC

## 2021-12-06 NOTE — Progress Notes (Signed)
[place]       Telephone: [phone]?Fax: [phone]   Oncology Clinical Pharmacist Practitioner Progress Note  Kimberly [name] was contacted via in-person to discuss her chemotherapy regimen for abemaciclib which they receive under the care of Dr. Benay Pike.  Current treatment regimen and start date abemaciclib (08/29/21) fulvestrant (08/15/21)  Interval History She continues to HOLD abemaciclib 50 mg by mouth  every 12 hours on days 1 to 28 of a 28-day cycle. This is being given  in combination with fulvestrant . Therapy is planned to continue until disease progression or unacceptable toxicity.   Response to Therapy Kimberly [name] is doing well.  She had her last radiation treatment today.  These started on 10/26/21. She is accompanied by her daughter Kimberly [name] today.  Kimberly [name] continues to hold abemaciclib which she started holding 5 days before radiation approximately on December 31.  Kimberly [name] reports being pretty sore from radiation.  Radiation oncology has given her some topical ointments to put on the area.  Since Kimberly [name] reports that the area is somewhat open, clinical pharmacy has recommended to continue to hold abemaciclib to allow for that area to heal.  She will next see Dr. Chryl Heck on February 27 and at that time Dr. Chryl Heck can assess the area and see if she is comfortable with restarting abemaciclib at that time.  Kimberly [name] verbalized understanding. She does need refills of her ondansetron and prochlorperazine so refill requests will be sent to her pharmacy of choice  Kimberly [name] received her every 4-week fulvestrant today and after visiting with Dr. Chryl Heck on January 16, Dr. Chryl Heck to has ordered Kimberly [name] to receive 5 doses of IV iron which will start today and finish up on November 27.  Those appointments have been entered by Dr. Chryl Heck.  Kimberly [name] will next see clinical pharmacy tentatively on March 14 with labs and her next fulvestrant injection and knows to contact  the clinic in the interim should any new symptoms or questions arise.  Labs, vitals, treatment parameters, and manufacturer guidelines assessing toxicity were reviewed with Kimberly [name] today. Based on these values, patient is in agreement to HOLD therapy at this time.  Allergies No Known Allergies  Vitals Vitals with BMI 12/06/2021 12/06/2021 11/06/2021  Height 5\' 9"  - -  Weight 293 lbs 11 oz - -  BMI 35.00 - -  Systolic 938 182 993  Diastolic 80 83 63  Pulse 82 93 74     Laboratory Data CBC EXTENDED Latest Ref Rng & Units 12/06/2021 11/06/2021 10/10/2021  WBC 4.0 - 10.5 K/uL 6.7 6.5 3.6(L)  RBC 3.87 - 5.11 MIL/uL 3.36(L) 3.53(L) 3.69(L)  HGB 12.0 - 15.0 g/dL 8.0(L) 8.9(L) 9.3(L)  HCT 36.0 - 46.0 % 26.7(L) 28.9(L) 30.0(L)  PLT 150 - 400 K/uL 211 238 223  NEUTROABS 1.7 - 7.7 K/uL 4.1 3.5 1.3(L)  LYMPHSABS 0.7 - 4.0 K/uL 1.6 2.3 1.9    CMP Latest Ref Rng & Units 12/06/2021 11/06/2021 10/10/2021  Glucose 70 - 99 mg/dL 90 97 87  BUN 6 - 20 mg/dL 9 9 6   Creatinine 0.44 - 1.00 mg/dL 0.92 0.98 1.17(H)  Sodium 135 - 145 mmol/L 137 137 139  Potassium 3.5 - 5.1 mmol/L 3.9 3.5 3.8  Chloride 98 - 111 mmol/L 103 104 106  CO2 22 - 32 mmol/L 29 25 27   Calcium 8.9 - 10.3 mg/dL 9.3 9.0 9.3  Total Protein 6.5 - 8.1 g/dL 7.4 7.3 7.3  Total  Bilirubin 0.3 - 1.2 mg/dL 0.3 0.3 0.3  Alkaline Phos 38 - 126 U/L 61 60 55  AST 15 - 41 U/L 10(L) 13(L) 8(L)  ALT 0 - 44 U/L 7 9 5     Lab Results  Component Value Date   MG 3.1 (H) 08/24/2020    Adverse Effects Assessment Holding abemaciclib since 10/21/21  Adherence Assessment Kimberly [name] reports missing 0 doses over the past 4 weeks due to adherence Reason for missed dose: N/A Patient was re-educated on importance of adherence.   Access Assessment Kimberly [name] is currently receiving her abemaciclib through  [employer] patient assistance   Insurance concerns:  none  Medication Reconciliation The patient's medication list was reviewed  today with the patient? Yes New medications or herbal supplements have recently been started? Yes , Silver sulfadiazine Any medications have been discontinued? No  The medication list was updated and reconciled based on the patient's most recent medication list in the electronic medical record (EMR) including herbal products and OTC medications.   Medications Current Outpatient Medications  Medication Sig Dispense Refill   abemaciclib (VERZENIO) 50 MG tablet Take 1 tablet (50 mg total) by mouth 2 (two) times daily. Swallow tablets whole. Do not chew, crush, or split tablets before swallowing. 60 tablet 6   acetaminophen (TYLENOL) 650 MG CR tablet Take 650 mg by mouth every 8 (eight) hours as needed for pain or fever.     albuterol (VENTOLIN HFA) 108 (90 Base) MCG/ACT inhaler USE 2 INHALATIONS BY MOUTH  EVERY 6 HOURS AS NEEDED FOR WHEEZING OR SHORTNESS OF  BREATH (Patient taking differently: 2 puffs every 6 (six) hours as needed for wheezing or shortness of breath.) 34 g 3   AMBULATORY NON FORMULARY MEDICATION Medication Name: Diltiazem 2%/ Lidocaine 5% Using your index finger, apply a small amount of medication inside the rectum up to your first knuckle/joint 4 daily x 8 weeks. 38 g 1   buPROPion (WELLBUTRIN XL) 150 MG 24 hr tablet Take 150 mg by mouth daily.     busPIRone (BUSPAR) 15 MG tablet Take 15 mg by mouth 3 (three) times daily.     doxepin (SINEQUAN) 75 MG capsule Take 75 mg by mouth at bedtime.     FLUoxetine (PROZAC) 40 MG capsule Take 40 mg by mouth at bedtime.     Fulvestrant (FASLODEX IM) Inject 2 Doses into the muscle every 14 (fourteen) days. Every other Tuesday     hydrocortisone (ANUSOL-HC) 25 MG suppository Use every other night for 2-weeks until prescription is complete 7 suppository 0   lamoTRIgine (LAMICTAL) 200 MG tablet Take 200 mg by mouth at bedtime.     lidocaine-prilocaine (EMLA) cream Apply 1 application topically as needed. 30 g 0   loperamide (IMODIUM) 2 MG capsule  Take 2 mg by mouth as needed for diarrhea or loose stools. Take 2 tablets (4 mg) with first sign of diarrhea, followed by 1 tablet (2 mg) with future occurrences of diarrhea. Take up to 8 tablets (16 mg) in 24 hours.     methocarbamol (ROBAXIN) 750 MG tablet Take 1 tablet (750 mg total) by mouth 4 (four) times daily. 60 tablet 0   montelukast (SINGULAIR) 10 MG tablet Take 1 tablet (10 mg total) by mouth at bedtime. 30 tablet 3   ondansetron (ZOFRAN) 8 MG tablet Take 1 tablet (8 mg total) by mouth 3 (three) times daily as needed for nausea or vomiting. 30 tablet 0   pantoprazole (PROTONIX) 40 MG tablet Take 1  tablet (40 mg total) by mouth daily. 30 tablet 0   prazosin (MINIPRESS) 1 MG capsule Take 1 mg by mouth at bedtime.     prochlorperazine (COMPAZINE) 5 MG tablet TAKE 1 TABLET(5 MG) BY MOUTH EVERY 6 HOURS AS NEEDED FOR NAUSEA OR VOMITING. MAY TAKE 10 MG 2 TABLETS IF 5 MG NOT SUFFICIENT. MAY CAUSE 30 tablet 0   QUEtiapine (SEROQUEL) 400 MG tablet Take 400 mg by mouth at bedtime.      rosuvastatin (CRESTOR) 10 MG tablet Take 1 tablet (10 mg total) by mouth daily. 90 tablet 1   Spacer/Aero-Holding Chambers (AEROCHAMBER PLUS) inhaler Use with inhaler 1 each 2   Tiotropium Bromide Monohydrate (SPIRIVA RESPIMAT) 1.25 MCG/ACT AERS Inhale 2 puffs into the lungs daily. 4 each 0   No current facility-administered medications for this visit.    Drug-Drug Interactions (DDIs) DDIs were evaluated? Yes Significant DDIs? No  The patient was instructed to speak with their health care provider and/or the oral chemotherapy pharmacist before starting any new drug, including prescription or over the counter, natural / herbal products, or vitamins.  Supportive Care Diarrhea: we reviewed that diarrhea is common with abemaciclib and confirmed that she does have loperamide (Imodium) at home.  We reviewed how to take this medication PRN Neutropenia: we discussed the importance of having a thermometer and what the  Centers for Disease Control and Prevention (CDC) considers a fever which is 100.3F (38C) or higher.  Gave patient 24/7 triage line to call if any fevers or symptoms ILD/Pneumonitis: we reviewed potential symptoms including cough, shortness, and fatigue.  Hepatotoxicity: WNL VTE: reviewed signs of DVT such as leg swelling, redness, pain, or tenderness and signs of PE such as shortness of breath, rapid or irregular heartbeat, cough, chest pain, or lightheadedness Reviewed to take the medication every 12 hours (with food sometimes can be easier on the stomach) and to take it at the same time every day.  HOLDING abemaciclib at this time as noted above  Dosing Assessment Hepatic adjustments needed? No  Renal adjustments needed? No  Toxicity adjustments needed? No  The current dosing regimen is not appropriate to continue at this time. Continue to hold until follow up visit with Dr. Chryl Heck on 12/18/21  Follow-Up Plan IV iron infusions x 5 per Dr. Chryl Heck starting today. Visits scheduled with last being on 12/18/21 Fulvestrant today and again in 4 weeks Continue to hold abemaciclib until seeing Dr. Chryl Heck again on 12/18/21 Labs and pharmacy visit tentatively on 01/02/22  Kimberly [name] participated in the discussion, expressed understanding, and voiced agreement with the above plan. All questions were answered to her satisfaction. The patient was advised to contact the clinic at [phone] with any questions or concerns prior to her return visit.   I spent 15 minutes assessing and educating the patient.  [name], RPH-CPP, 12/06/2021  11:40 AM   **Disclaimer: This note was dictated with voice recognition software. Similar sounding words can inadvertently be transcribed and this note may contain transcription errors which may not have been corrected upon publication of note.**

## 2021-12-06 NOTE — Progress Notes (Signed)
Patient declined to stay for 30 minute post observations.

## 2021-12-08 MED FILL — Iron Sucrose Inj 20 MG/ML (Fe Equiv): INTRAVENOUS | Qty: 10 | Status: AC

## 2021-12-09 ENCOUNTER — Inpatient Hospital Stay: Payer: Medicare Other

## 2021-12-09 ENCOUNTER — Other Ambulatory Visit: Payer: Self-pay

## 2021-12-09 VITALS — BP 124/88 | HR 96 | Temp 98.9°F | Resp 18

## 2021-12-09 DIAGNOSIS — C50412 Malignant neoplasm of upper-outer quadrant of left female breast: Secondary | ICD-10-CM | POA: Insufficient documentation

## 2021-12-09 DIAGNOSIS — D509 Iron deficiency anemia, unspecified: Secondary | ICD-10-CM

## 2021-12-09 DIAGNOSIS — Z5111 Encounter for antineoplastic chemotherapy: Secondary | ICD-10-CM | POA: Insufficient documentation

## 2021-12-09 DIAGNOSIS — Z79818 Long term (current) use of other agents affecting estrogen receptors and estrogen levels: Secondary | ICD-10-CM | POA: Diagnosis not present

## 2021-12-09 DIAGNOSIS — Z9012 Acquired absence of left breast and nipple: Secondary | ICD-10-CM | POA: Diagnosis not present

## 2021-12-09 DIAGNOSIS — Z923 Personal history of irradiation: Secondary | ICD-10-CM | POA: Insufficient documentation

## 2021-12-09 DIAGNOSIS — Z95828 Presence of other vascular implants and grafts: Secondary | ICD-10-CM

## 2021-12-09 MED ORDER — HEPARIN SOD (PORK) LOCK FLUSH 100 UNIT/ML IV SOLN
500.0000 [IU] | Freq: Once | INTRAVENOUS | Status: AC
  Administered 2021-12-09: 500 [IU] via INTRAVENOUS

## 2021-12-09 MED ORDER — SODIUM CHLORIDE 0.9 % IV SOLN: Freq: Once | INTRAVENOUS | Status: AC

## 2021-12-09 MED ORDER — SODIUM CHLORIDE 0.9% FLUSH
10.0000 mL | Freq: Once | INTRAVENOUS | Status: AC
  Administered 2021-12-09: 10 mL via INTRAVENOUS

## 2021-12-09 MED ORDER — SODIUM CHLORIDE 0.9 % IV SOLN
200.0000 mg | Freq: Once | INTRAVENOUS | Status: AC
  Administered 2021-12-09: 200 mg via INTRAVENOUS
  Filled 2021-12-09: qty 200

## 2021-12-09 NOTE — Progress Notes (Signed)
Patient declined to stay for 30 minutes following administration of venofer infusion. Vital signs retaken and remained stable. Patient showed no signs of distress at time of discharge.

## 2021-12-09 NOTE — Patient Instructions (Signed)

## 2021-12-12 ENCOUNTER — Telehealth: Payer: Self-pay

## 2021-12-12 ENCOUNTER — Inpatient Hospital Stay: Payer: Medicare Other

## 2021-12-12 ENCOUNTER — Other Ambulatory Visit: Payer: Self-pay

## 2021-12-12 VITALS — BP 114/72 | HR 100 | Temp 98.7°F | Resp 20 | Wt 295.8 lb

## 2021-12-12 DIAGNOSIS — D509 Iron deficiency anemia, unspecified: Secondary | ICD-10-CM | POA: Diagnosis not present

## 2021-12-12 DIAGNOSIS — Z5111 Encounter for antineoplastic chemotherapy: Secondary | ICD-10-CM | POA: Diagnosis not present

## 2021-12-12 DIAGNOSIS — Z923 Personal history of irradiation: Secondary | ICD-10-CM | POA: Diagnosis not present

## 2021-12-12 DIAGNOSIS — C50412 Malignant neoplasm of upper-outer quadrant of left female breast: Secondary | ICD-10-CM | POA: Diagnosis not present

## 2021-12-12 DIAGNOSIS — Z9012 Acquired absence of left breast and nipple: Secondary | ICD-10-CM | POA: Diagnosis not present

## 2021-12-12 MED ORDER — SODIUM CHLORIDE 0.9 % IV SOLN
200.0000 mg | Freq: Once | INTRAVENOUS | Status: AC
  Administered 2021-12-12: 200 mg via INTRAVENOUS
  Filled 2021-12-12: qty 200

## 2021-12-12 MED ORDER — SODIUM CHLORIDE 0.9 % IV SOLN: INTRAVENOUS | Status: DC

## 2021-12-12 NOTE — Progress Notes (Signed)
Pt declined to stay for 30 minute observation period post Venofer infusion. VSS. Pt had no complaints upon discharge.

## 2021-12-12 NOTE — Progress Notes (Signed)
Pt complaining of stabbing chest pain at radiation site on and off x2 weeks. Pt not experiencing any pain in infusion center today. RN contacted Iruku MD, and Isidore Moos MD. Egbert Garibaldi RN advised pt to continue silvadene treatment of radiation burns and call for further issues. Pt advised when to seek emergency treatment for chest pain. Pt expressed understanding.

## 2021-12-12 NOTE — Telephone Encounter (Addendum)
Notified by infusion nurse that patient was complaining of stabbing chest pain in radiation field (completed radiation to left chest wall on 12/06/21). Infusion nurse confirmed that patient's vitals were stable (mild tachycardia from recent exertion walking into building). Spoke with patient directly who stated pain was similar to what she experienced during her last week of radiation--pain related to skin sensitivity and moist desquamation. Informed patient that she would continue to experience discomfort and skin changes in treatment field for up to 2 weeks from completing treatment. Told her I would call her on 12/20/21 when she will be exactly 2 weeks out from finishing radiation to check and see how her symptoms are progressing. Patient confirmed that she is continuing to apply Silvadene to areas of moist desquamation, and antibiotic ointment to the areas of dry peeling. Encouraged patient to call me directly should she have any questions/concerns before that date. Patient knows to proceed to the ED should her chest pain symptoms worsen or change, or she experience any other heart attack associated symptoms. Dr. Isidore Moos updated on plan.

## 2021-12-15 MED FILL — Iron Sucrose Inj 20 MG/ML (Fe Equiv): INTRAVENOUS | Qty: 10 | Status: AC

## 2021-12-16 ENCOUNTER — Other Ambulatory Visit: Payer: Self-pay

## 2021-12-16 ENCOUNTER — Inpatient Hospital Stay: Payer: Medicare Other

## 2021-12-16 VITALS — BP 138/94 | HR 88 | Temp 98.1°F | Resp 20

## 2021-12-16 DIAGNOSIS — C50412 Malignant neoplasm of upper-outer quadrant of left female breast: Secondary | ICD-10-CM | POA: Diagnosis not present

## 2021-12-16 DIAGNOSIS — Z923 Personal history of irradiation: Secondary | ICD-10-CM | POA: Diagnosis not present

## 2021-12-16 DIAGNOSIS — D509 Iron deficiency anemia, unspecified: Secondary | ICD-10-CM | POA: Diagnosis not present

## 2021-12-16 DIAGNOSIS — Z9012 Acquired absence of left breast and nipple: Secondary | ICD-10-CM | POA: Diagnosis not present

## 2021-12-16 DIAGNOSIS — Z5111 Encounter for antineoplastic chemotherapy: Secondary | ICD-10-CM | POA: Diagnosis not present

## 2021-12-16 MED ORDER — SODIUM CHLORIDE 0.9 % IV SOLN: Freq: Once | INTRAVENOUS | Status: AC

## 2021-12-16 MED ORDER — HEPARIN SOD (PORK) LOCK FLUSH 100 UNIT/ML IV SOLN
500.0000 [IU] | Freq: Once | INTRAVENOUS | Status: AC
  Administered 2021-12-16: 500 [IU] via INTRAVENOUS

## 2021-12-16 MED ORDER — SODIUM CHLORIDE 0.9% FLUSH
10.0000 mL | INTRAVENOUS | Status: DC | PRN
  Administered 2021-12-16: 10 mL via INTRAVENOUS

## 2021-12-16 MED ORDER — SODIUM CHLORIDE 0.9 % IV SOLN
200.0000 mg | Freq: Once | INTRAVENOUS | Status: AC
  Administered 2021-12-16: 200 mg via INTRAVENOUS
  Filled 2021-12-16: qty 200

## 2021-12-16 NOTE — Patient Instructions (Signed)

## 2021-12-18 ENCOUNTER — Other Ambulatory Visit: Payer: Self-pay | Admitting: *Deleted

## 2021-12-18 ENCOUNTER — Inpatient Hospital Stay: Payer: Medicare Other

## 2021-12-18 ENCOUNTER — Inpatient Hospital Stay: Payer: Medicare Other | Admitting: Hematology and Oncology

## 2021-12-19 ENCOUNTER — Inpatient Hospital Stay: Payer: Medicare Other

## 2021-12-19 ENCOUNTER — Ambulatory Visit: Payer: Medicare Other

## 2021-12-19 ENCOUNTER — Inpatient Hospital Stay (HOSPITAL_BASED_OUTPATIENT_CLINIC_OR_DEPARTMENT_OTHER): Payer: Medicare Other | Admitting: Adult Health

## 2021-12-19 ENCOUNTER — Encounter: Payer: Self-pay | Admitting: Adult Health

## 2021-12-19 ENCOUNTER — Other Ambulatory Visit: Payer: Self-pay

## 2021-12-19 VITALS — BP 123/64 | HR 84 | Temp 98.1°F | Resp 16

## 2021-12-19 VITALS — BP 150/89 | HR 92 | Temp 97.7°F | Resp 18 | Ht 69.0 in | Wt 294.6 lb

## 2021-12-19 DIAGNOSIS — D509 Iron deficiency anemia, unspecified: Secondary | ICD-10-CM | POA: Diagnosis not present

## 2021-12-19 DIAGNOSIS — D508 Other iron deficiency anemias: Secondary | ICD-10-CM

## 2021-12-19 DIAGNOSIS — Z95828 Presence of other vascular implants and grafts: Secondary | ICD-10-CM

## 2021-12-19 DIAGNOSIS — C50912 Malignant neoplasm of unspecified site of left female breast: Secondary | ICD-10-CM

## 2021-12-19 DIAGNOSIS — C50412 Malignant neoplasm of upper-outer quadrant of left female breast: Secondary | ICD-10-CM | POA: Diagnosis not present

## 2021-12-19 DIAGNOSIS — Z9012 Acquired absence of left breast and nipple: Secondary | ICD-10-CM | POA: Diagnosis not present

## 2021-12-19 DIAGNOSIS — Z5111 Encounter for antineoplastic chemotherapy: Secondary | ICD-10-CM | POA: Diagnosis not present

## 2021-12-19 DIAGNOSIS — Z923 Personal history of irradiation: Secondary | ICD-10-CM | POA: Diagnosis not present

## 2021-12-19 LAB — CMP (CANCER CENTER ONLY)
ALT: 7 U/L (ref 0–44)
AST: 13 U/L — ABNORMAL LOW (ref 15–41)
Albumin: 4.3 g/dL (ref 3.5–5.0)
Alkaline Phosphatase: 66 U/L (ref 38–126)
Anion gap: 3 — ABNORMAL LOW (ref 5–15)
BUN: 5 mg/dL — ABNORMAL LOW (ref 6–20)
CO2: 30 mmol/L (ref 22–32)
Calcium: 9.3 mg/dL (ref 8.9–10.3)
Chloride: 105 mmol/L (ref 98–111)
Creatinine: 1.03 mg/dL — ABNORMAL HIGH (ref 0.44–1.00)
GFR, Estimated: >60 mL/min (ref >60)
Glucose, Bld: 91 mg/dL (ref 70–99)
Potassium: 3.9 mmol/L (ref 3.5–5.1)
Sodium: 138 mmol/L (ref 135–145)
Total Bilirubin: 0.4 mg/dL (ref 0.3–1.2)
Total Protein: 7.6 g/dL (ref 6.5–8.1)

## 2021-12-19 LAB — CBC WITH DIFFERENTIAL (CANCER CENTER ONLY)
Abs Immature Granulocytes: 0.05 10*3/uL (ref 0.00–0.07)
Basophils Absolute: 0 10*3/uL (ref 0.0–0.1)
Basophils Relative: 1 %
Eosinophils Absolute: 0 10*3/uL (ref 0.0–0.5)
Eosinophils Relative: 0 %
HCT: 31.3 % — ABNORMAL LOW (ref 36.0–46.0)
Hemoglobin: 9.5 g/dL — ABNORMAL LOW (ref 12.0–15.0)
Immature Granulocytes: 1 %
Lymphocytes Relative: 25 %
Lymphs Abs: 1.3 10*3/uL (ref 0.7–4.0)
MCH: 25 pg — ABNORMAL LOW (ref 26.0–34.0)
MCHC: 30.4 g/dL (ref 30.0–36.0)
MCV: 82.4 fL (ref 80.0–100.0)
Monocytes Absolute: 0.5 10*3/uL (ref 0.1–1.0)
Monocytes Relative: 10 %
Neutro Abs: 3.3 10*3/uL (ref 1.7–7.7)
Neutrophils Relative %: 63 %
Platelet Count: 253 10*3/uL (ref 150–400)
RBC: 3.8 MIL/uL — ABNORMAL LOW (ref 3.87–5.11)
RDW: 22.5 % — ABNORMAL HIGH (ref 11.5–15.5)
WBC Count: 5.3 10*3/uL (ref 4.0–10.5)
nRBC: 0 % (ref 0.0–0.2)

## 2021-12-19 MED ORDER — SODIUM CHLORIDE 0.9% FLUSH
10.0000 mL | INTRAVENOUS | Status: AC | PRN
  Administered 2021-12-19: 10 mL

## 2021-12-19 MED ORDER — HEPARIN SOD (PORK) LOCK FLUSH 100 UNIT/ML IV SOLN
250.0000 [IU] | Freq: Once | INTRAVENOUS | Status: AC | PRN
  Administered 2021-12-19: 500 [IU]

## 2021-12-19 MED ORDER — SODIUM CHLORIDE 0.9 % IV SOLN: Freq: Once | INTRAVENOUS | Status: AC

## 2021-12-19 MED ORDER — SODIUM CHLORIDE 0.9% FLUSH
3.0000 mL | Freq: Once | INTRAVENOUS | Status: AC | PRN
  Administered 2021-12-19: 10 mL

## 2021-12-19 MED ORDER — SODIUM CHLORIDE 0.9 % IV SOLN
200.0000 mg | Freq: Once | INTRAVENOUS | Status: AC
  Administered 2021-12-19: 200 mg via INTRAVENOUS
  Filled 2021-12-19: qty 200

## 2021-12-19 NOTE — Progress Notes (Signed)
Patient declined to stay for 30 minute post iron observation. Vitals stable and patient in no distress upon leaving infusion clinic.

## 2021-12-19 NOTE — Progress Notes (Signed)
[facility] Follow up:    Kimberly Del, DO [place] [address]   DIAGNOSIS:  Cancer Staging  No matching staging information was found for the patient.  SUMMARY OF ONCOLOGIC HISTORY: Oncology History Overview Note  The patient had bilateral screening mammography with tomography 03/19/2014. This was the patient's first ever mammography. Showed a possible mass in the left breast. Left diagnostic mammography and ultrasonography 04/01/2014 showed an irregular mass in the upper outer quadrant of the left breast with a possible satellite 1 cm anterior to it. On physical exam there was a firm palpable mass at the 1:00 position of the left breast 8 cm from the nipple. There was no palpable left axillary adenopathy. Ultrasound showed an irregular hypoechoic mass in the area in question measuring 1.4 cm. There was a 4 mm nodule located anterior to this. Ultrasound of the left axilla was benign.  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 The second mass was negative for malignancy. This was felt to be concordant.  On 04/08/2014 the patient underwent bilateral breast MRI. This showed a 9 mm enhancing mass in the subareolar area of the right breast in in the left breast, the previously noted mass measuring 1.3 cm. There were no morphologically abnormal lymph nodes  The right breast finding was followed up on 04/19/2014 with ultrasound which showed an intraductal soft tissue mass in the inferior subareolar worsening of the right breast measuring 8 mm. This was biopsied 04/19/2014 and showed ([id number]) ectatic duct, with no evidence of malignancy. Surgical excision  was recommended.  Accordingly on 05/03/2014 the patient underwent right lumpectomy, showing an intraductal papilloma, with known malignancy identified. Left lumpectomy on the same day showed an invasive ductal carcinoma measuring 1.5 cm, with one of the 2 sentinel lymph nodes positive for carcinoma. There was no extracapsular extension. Margins were clear and ample. HER-2 was repeated and was again negative.  The patient's case was discussed in the multidisciplinary breast cancer conference 05/12/2014. An Oncotype had been previously requested and this showed a recurrent score of 22, in the intermediate range. The patient qualifies for the S1007 study and this will be discussed with her. Otherwise the standard recommendation would be chemotherapy followed by radiation followed by anti-estrogens. Oncotype score of 22 predicts a risk of outside the breast recurrence within 10 years of 13% if the patient's only systemic therapy is tamoxifen for 5 years.  (2) adjuvant chemotherapy with cyclophosphamide and docetaxel started 07/06/2014, patient tolerated chemotherapy very poorly and it was discontinued after one cycle.    (3) adjuvant radiation completed 10/21/2014 1) Left Breast / 50.4 Gy in 28 fractions 2) Left supraclavicular / 50.4 Gy in 28 fractions 3) Left Posterior axillary boost  / 9.52 Gy in 28 fractions 4) Left Breast Boost / 10 Gy in 5 fractions  (4) anastrozole started 01/19/2015-[name] informed me she never started this.   METASTATIC DISEASE to skin: September 2022 (5) mammography 05/24/2021 shows fluid collection in the right axilla, no obvious malignancy within each breast but superficial skin lesions throughout the upper outer left breast.  (A) CT scan of the abdomen and pelvis with contrast 06/10/2021 shows small low-attenuation liver lesions  which could be cysts but no definite evidence of metastatic disease.  (B) chest CT scan 07/14/2021 shows no evidence of metastatic  disease  (C) CT head with and without contrast on 07/15/2021 shows no evidence of metastatic disease (D) biopsy left breast skin lesion 07/25/2021 shows metastatic carcinoma, estrogen and progesterone receptor positive, HER2 not amplified (1+)  (E) excision right axillary mass 07/25/2021 shows benign epidermoid cyst (E) CA 27-29 on 07/05/2021 is normal at 21.1  (6) iron deficiency anemia: On 07/05/2021 the ferritin was less than 4 and the iron saturation 3%; hemoglobin was 9.1 with an MCV of 71.3  (A) Venofer started on 07/18/2021, to be repeated x5   (B) GI evaluation 08/11/2021  (7) to start fulvestrant 09/13/2021  (A) to start abemaciclib 50 mg twice daily starting 09/13/2021  (8) status post left mastectomy 08/28/2021 for a pT3 pNX invasive lobular carcinoma, grade 2, with negative margins.   Recurrent breast cancer, left ([place])  04/14/2014 Initial Diagnosis   Recurrent breast cancer, left (HCC)     CURRENT THERAPY: Fulvestrant   INTERVAL HISTORY: Kimberly [name] 51 y.o. female returns for evaluation prior to receiving her final IV iron dose.  She notes that she has been tolerating IV iron well.  She is most concerned about the pain at her mastectomy site since completing radiation.  She notes her skin is burned and peeling.  She is applying the cream as recommended however she has a very difficult time managing her pain.   Patient Active Problem List   Diagnosis Date Noted   S/P mastectomy, left 08/28/2021   Goals of care, counseling/discussion 07/18/2021   Microcytic anemia 07/05/2021   Left corneal abrasion 06/30/2021   History of COVID-19 01/27/2021   Elevated LDL cholesterol level 12/30/2020   COPD exacerbation ([place]) 08/24/2020   Oligouria 03/10/2020   History of breast cancer 02/11/2020   Total body pain 01/13/2019   Postmenopausal bleeding 10/25/2017   Chronic pain of both knees 05/03/2017   COPD (chronic obstructive pulmonary disease) ([place]) 03/20/2017   Tachycardia     Mixed incontinence 09/29/2015   Genetic testing 08/26/2015   Recurrent breast cancer, left ([place]) 04/14/2014   Disorder of bladder 09/29/2008   LOW BACK PAIN, CHRONIC 09/29/2008   Morbid (severe) obesity due to excess calories ([place]) 11/19/2007   ANEMIA, IRON DEFICIENCY, CHRONIC 11/14/2007   Mixed bipolar I disorder ([place]) 02/20/2007   Tobacco use disorder 02/20/2007    has No Known Allergies.  MEDICAL HISTORY: Past Medical History:  Diagnosis Date   Acute cholecystitis 04/17/2019   Anemia    Anxiety    Arthritis    Bipolar 1 disorder ([place])    Blood transfusion without reported diagnosis 2007   post bleeding childbirth   Cancer ([place])    left breast cancer 2015   COPD (chronic obstructive pulmonary disease) ([place])    pt reports she is not on oxygen   COVID-19 virus infection 06/07/2019   Depression    Diabetes mellitus without complication ([place])    gestational   Hand injury, right, initial encounter 03/19/2019   Heart murmur    as a child-not adult-no cardiac work up   History of radiation therapy 08/31/14-10/21/14   left breast/ left supraclavicular 50.4 Gy 28 fx, lef tposterior axillary boost 9.52 Gy 28 fx, left rbeast boost/ 10 Gy 5 fx   Personal history of chemotherapy    Personal history of radiation therapy    Rash and nonspecific skin eruption 12/27/2020    SURGICAL  HISTORY: Past Surgical History:  Procedure Laterality Date   BREAST BIOPSY Left 04/01/14   BREAST BIOPSY Right 04/19/14   BREAST LUMPECTOMY WITH AXILLARY LYMPH NODE DISSECTION Bilateral 05/03/14   CHOLECYSTECTOMY N/A 04/18/2019   Procedure: LAPAROSCOPIC CHOLECYSTECTOMY WITH INTRAOPERATIVE CHOLANGIOGRAM;  Surgeon: Jovita Kussmaul, MD;  Location: [facility];  Service: General;  Laterality: N/A;   DILATION AND CURETTAGE OF UTERUS     after childbirth   MASS EXCISION Right 07/25/2021   Procedure: RIGHT AXILLARY MASS EXCISION;  Surgeon: [name], MD;  Location: [facility];  Service: General;  Laterality:  Right;   MINOR BREAST BIOPSY Left 07/25/2021   Procedure: SKIN PUNCH BIOPSY LEFT BREAST;  Surgeon: [name], MD;  Location: [facility];  Service: General;  Laterality: Left;   MULTIPLE TOOTH EXTRACTIONS     only 5 left   PORTACATH PLACEMENT Right 06/18/2014   Procedure: INSERTION PORT-A-CATH;  Surgeon: [name], MD;  Location: [facility];  Service: General;  Laterality: Right;   TONSILLECTOMY AND ADENOIDECTOMY     TOTAL MASTECTOMY Left 08/28/2021   Procedure: LEFT TOTAL MASTECTOMY;  Surgeon: [name], MD;  Location: [facility];  Service: General;  Laterality: Left;  90 MINUTES- POST PER KELLY AND SHE WILL PUT IN ROOM    SOCIAL HISTORY: Social History   Socioeconomic History   Marital status: Widowed    Spouse name: Not on file   Number of children: 5   Years of education: 12   Highest education level: High school graduate  Occupational History   Occupation: disabled  Tobacco Use   Smoking status: Every Day    Packs/day: 0.50    Years: 34.00    Pack years: 17.00    Types: Cigarettes   Smokeless tobacco: Never  Vaping Use   Vaping Use: Never used  Substance and Sexual Activity   Alcohol use: No   Drug use: No   Sexual activity: Not Currently  Other Topics Concern   Not on file  Social History Narrative   Patient lives in [place] with 3 of her children.    One adult son lives "up the road" and one lives in [place].   Patient is on disability.    Patient enjoys spending time with her family and watching TV.    Patient walks around her home for exercise.    Social Determinants of Health   Financial Resource Strain: Medium Risk   Difficulty of Paying Living Expenses: Somewhat hard  Food Insecurity: Food Insecurity Present   Worried About Running Out of Food in the Last Year: Sometimes true   Ran Out of Food in the Last Year: Never true  Transportation Needs: No Transportation Needs   Lack of Transportation (Medical): No   Lack of  Transportation (Non-Medical): No  Physical Activity: Inactive   Days of Exercise per Week: 0 days   Minutes of Exercise per Session: 0 min  Stress: No Stress Concern Present   Feeling of Stress : Not at all  Social Connections: Moderately Integrated   Frequency of Communication with Friends and Family: More than three times a week   Frequency of Social Gatherings with Friends and Family: More than three times a week   Attends Religious Services: More than 4 times per year   Active Member of [employer] or Organizations: Yes   Attends Archivist Meetings: More than 4 times per year   Marital Status: Widowed  Intimate Partner Violence: Not At Risk   Fear  of Current or Ex-Partner: No   Emotionally Abused: No   Physically Abused: No   Sexually Abused: No    FAMILY HISTORY: Family History  Problem Relation Age of Onset   Heart disease Father    Cancer Father        Prostate   Cancer Maternal Grandmother    Depression Maternal Grandfather    Cancer Paternal Grandmother    Stomach cancer Neg Hx    Esophageal cancer Neg Hx    Rectal cancer Neg Hx     Review of Systems  Constitutional:  Positive for fatigue. Negative for appetite change, chills, fever and unexpected weight change.  HENT:   Negative for hearing loss, lump/mass and trouble swallowing.   Eyes:  Negative for eye problems and icterus.  Respiratory:  Negative for chest tightness, cough and shortness of breath.   Cardiovascular:  Negative for chest pain, leg swelling and palpitations.  Gastrointestinal:  Negative for abdominal distention, abdominal pain, constipation, diarrhea, nausea and vomiting.  Endocrine: Negative for hot flashes.  Genitourinary:  Negative for difficulty urinating.   Musculoskeletal:  Negative for arthralgias.  Skin:  Negative for itching and rash.  Neurological:  Negative for dizziness, extremity weakness, headaches and numbness.  Hematological:  Negative for adenopathy. Does not bruise/bleed  easily.  Psychiatric/Behavioral:  Negative for depression. The patient is not nervous/anxious.      PHYSICAL EXAMINATION  ECOG PERFORMANCE STATUS: 1 - Symptomatic but completely ambulatory  Vitals:   12/19/21 1212  BP: (!) 150/89  Pulse: 92  Resp: 18  Temp: 97.7 F (36.5 C)  SpO2: 100%    Physical Exam Constitutional:      General: She is not in acute distress.    Appearance: Normal appearance. She is not toxic-appearing.  HENT:     Head: Normocephalic and atraumatic.  Eyes:     General: No scleral icterus. Cardiovascular:     Rate and Rhythm: Normal rate and regular rhythm.     Pulses: Normal pulses.     Heart sounds: Normal heart sounds.  Pulmonary:     Effort: Pulmonary effort is normal.     Breath sounds: Normal breath sounds.  Chest:     Comments: Left breast is status postmastectomy and radiation.  There is dry desquamation present along the left chest wall.  There are no signs of infection or drainage.  Skin appears to be healing. Abdominal:     General: Abdomen is flat. Bowel sounds are normal. There is no distension.     Palpations: Abdomen is soft.     Tenderness: There is no abdominal tenderness.  Musculoskeletal:        General: No swelling.     Cervical back: Neck supple.  Lymphadenopathy:     Cervical: No cervical adenopathy.  Skin:    General: Skin is warm and dry.     Findings: No rash.  Neurological:     General: No focal deficit present.     Mental Status: She is alert.  Psychiatric:        Mood and Affect: Mood normal.        Behavior: Behavior normal.    LABORATORY DATA:  CBC    Component Value Date/Time   WBC 5.3 12/19/2021 1144   WBC 12.7 (H) 09/17/2021 0554   RBC 3.80 (L) 12/19/2021 1144   HGB 9.5 (L) 12/19/2021 1144   HGB 14.4 01/19/2015 1421   HCT 31.3 (L) 12/19/2021 1144   HCT 44.9 01/19/2015 1421  PLT 253 12/19/2021 1144   PLT 238 01/19/2015 1421   MCV 82.4 12/19/2021 1144   MCV 79.9 01/19/2015 1421   MCH 25.0 (L)  12/19/2021 1144   MCHC 30.4 12/19/2021 1144   RDW 22.5 (H) 12/19/2021 1144   RDW 17.3 (H) 01/19/2015 1421   LYMPHSABS 1.3 12/19/2021 1144   LYMPHSABS 1.3 01/19/2015 1421   MONOABS 0.5 12/19/2021 1144   MONOABS 0.6 01/19/2015 1421   EOSABS 0.0 12/19/2021 1144   EOSABS 0.0 01/19/2015 1421   BASOSABS 0.0 12/19/2021 1144   BASOSABS 0.0 01/19/2015 1421    CMP     Component Value Date/Time   NA 138 12/19/2021 1144   NA 138 03/10/2020 1534   NA 139 01/19/2015 1422   K 3.9 12/19/2021 1144   K 3.8 01/19/2015 1422   CL 105 12/19/2021 1144   CO2 30 12/19/2021 1144   CO2 23 01/19/2015 1422   GLUCOSE 91 12/19/2021 1144   GLUCOSE 93 01/19/2015 1422   BUN 5 (L) 12/19/2021 1144   BUN 8 03/10/2020 1534   BUN 8.6 01/19/2015 1422   CREATININE 1.03 (H) 12/19/2021 1144   CREATININE 1.1 01/19/2015 1422   CALCIUM 9.3 12/19/2021 1144   CALCIUM 9.3 01/19/2015 1422   PROT 7.6 12/19/2021 1144   PROT 7.2 02/11/2020 1626   PROT 7.3 01/19/2015 1422   ALBUMIN 4.3 12/19/2021 1144   ALBUMIN 4.4 02/11/2020 1626   ALBUMIN 3.9 01/19/2015 1422   AST 13 (L) 12/19/2021 1144   AST 14 01/19/2015 1422   ALT 7 12/19/2021 1144   ALT 13 01/19/2015 1422   ALKPHOS 66 12/19/2021 1144   ALKPHOS 82 01/19/2015 1422   BILITOT 0.4 12/19/2021 1144   BILITOT 0.35 01/19/2015 1422   GFRNONAA >60 12/19/2021 1144   GFRAA 76 03/10/2020 1534         ASSESSMENT and THERAPY PLAN:   Recurrent breast cancer, left (HCC) Kimberly [name] is a 51 year old woman with recurrent left-sided breast cancer who is status post mastectomy and radiation.    #1 recurrent breast cancer: She is taking fulvestrant and has completed her load and is gone to every 4 weeks of injections with this.  She is tolerating this well.  She has not yet started back the Verzenio.  She notes that Jenny Reichmann was very specific with her not to take any of it until she saw him again and he could assess that she was ready to restart.  I recommended that she continue  to apply the cream to her chest wall where she has the skin is.  If she runs that she can always use Aquaphor which is helpful with pain that can be associated with the radiation burns.  #2 iron deficiency: She will proceed with IV iron today as ordered.  This will be her final dose of the 1000 mg.  She has tolerated this well.  Byrnett has follow-up in 2 weeks for labs and discussion with Gilford Rile clinical pharmacy practitioner.   No orders of the defined types were placed in this encounter.   All questions were answered. The patient knows to call the clinic with any problems, questions or concerns. We can certainly see the patient much sooner if necessary.  Total encounter time: 20 minutes in face-to-face visit time, chart review, lab review, order entry, care coordination, and documentation of the encounter.  Wilber Bihari, NP 12/20/21 10:42 PM [facility] [address], [place] [address] Tel. [phone]  Fax. [phone]  *Total Encounter Time as defined by the Centers for Medicare and Medicaid Services includes, in addition to the face-to-face time of a patient visit (documented in the note above) non-face-to-face time: obtaining and reviewing outside history, ordering and reviewing medications, tests or procedures, care coordination (communications with other health care professionals or caregivers) and documentation in the medical record.

## 2021-12-19 NOTE — Patient Instructions (Signed)

## 2021-12-20 ENCOUNTER — Encounter: Payer: Self-pay | Admitting: Adult Health

## 2021-12-20 ENCOUNTER — Telehealth: Payer: Self-pay

## 2021-12-20 NOTE — Assessment & Plan Note (Signed)
Kimberly [name] is a 51 year old woman with recurrent left-sided breast cancer who is status post mastectomy and radiation.   ? ?#1 recurrent breast cancer: She is taking fulvestrant and has completed her load and is gone to every 4 weeks of injections with this.  She is tolerating this well.  She has not yet started back the Verzenio.  She notes that Kimberly [name] was very specific with her not to take any of it until she saw him again and he could assess that she was ready to restart.  I recommended that she continue to apply the cream to her chest wall where she has the skin is.  If she runs that she can always use Aquaphor which is helpful with pain that can be associated with the radiation burns. ? ?#2 iron deficiency: She will proceed with IV iron today as ordered.  This will be her final dose of the 1000 mg.  She has tolerated this well. ? ?Kimberly [name] has follow-up in 2 weeks for labs and discussion with Kimberly [name] clinical pharmacy practitioner. ?

## 2021-12-20 NOTE — Telephone Encounter (Signed)
Called to and spoke with patient directly to see how her skin and chest discomfort were fairing. She reports her symptoms have all improved and she is doing well. Informed her I would call again to check on her before her scheduled follow-up with Dr. Isidore Moos on 01/09/22. If she was still progressing well and didn't feel she needed to come into the clinic to see Dr. Isidore Moos, we could do her follow-up on the phone. Patient verbalized understanding and appreciation of call, and denied any other needs at this time. She confirmed she has my direct contact number should she need anything before our next check-in in a few weeks ?

## 2021-12-26 ENCOUNTER — Encounter: Payer: Self-pay | Admitting: Adult Health

## 2021-12-26 NOTE — Progress Notes (Signed)
° °                                                                                                                                                          °  Patient Name: Kimberly [name] MRN: [id number] DOB: 1971/08/13 Referring Physician: Lurline Del (Profile Not Attached) Date of Service: 12/06/2021 [facility], [place]                                                        End Of Treatment Note  Diagnoses: C50.412-Malignant neoplasm of upper-outer quadrant of left female breast  Cancer Staging:  Cancer Staging  No matching staging information was found for the patient. Recurrent breast cancer, left ([place])  C50.912   pT4b, pN1a   Intent: Curative  Radiation Treatment Dates: 10/26/2021 through 12/06/2021 Site Technique Total Dose (Gy) Dose per Fx (Gy) Completed Fx Beam Energies  Chest Wall, Left: CW_L_[id number]-tx 3D 50.4/50.4 1.8 28/28 10X   Narrative: The patient tolerated left chest wall re irradiation relatively well with expected skin desquamation; the nodes were not comprehensively covered due to risk of brachial plexopathy and heart injury.  Plan: The patient will follow-up with radiation oncology in 1 mo. -----------------------------------  [name], MD

## 2022-01-02 ENCOUNTER — Inpatient Hospital Stay: Payer: Medicare Other

## 2022-01-02 ENCOUNTER — Telehealth: Payer: Self-pay | Admitting: Hematology and Oncology

## 2022-01-02 ENCOUNTER — Inpatient Hospital Stay: Payer: Medicare Other | Admitting: Pharmacist

## 2022-01-02 NOTE — Telephone Encounter (Signed)
Request to r/s per secure chat- Kimberly [name], pt aware ?

## 2022-01-04 ENCOUNTER — Inpatient Hospital Stay: Payer: Medicare Other | Admitting: Pharmacist

## 2022-01-04 ENCOUNTER — Inpatient Hospital Stay: Payer: Medicare Other

## 2022-01-04 ENCOUNTER — Inpatient Hospital Stay: Payer: Medicare Other | Attending: Oncology

## 2022-01-04 ENCOUNTER — Other Ambulatory Visit: Payer: Self-pay

## 2022-01-04 VITALS — BP 142/76 | HR 90 | Temp 97.7°F | Resp 18 | Ht 69.0 in | Wt 292.2 lb

## 2022-01-04 DIAGNOSIS — C50412 Malignant neoplasm of upper-outer quadrant of left female breast: Secondary | ICD-10-CM | POA: Diagnosis not present

## 2022-01-04 DIAGNOSIS — Z5111 Encounter for antineoplastic chemotherapy: Secondary | ICD-10-CM | POA: Diagnosis not present

## 2022-01-04 LAB — CBC WITH DIFFERENTIAL (CANCER CENTER ONLY)
Abs Immature Granulocytes: 0.01 10*3/uL (ref 0.00–0.07)
Basophils Absolute: 0 10*3/uL (ref 0.0–0.1)
Basophils Relative: 1 %
Eosinophils Absolute: 0 10*3/uL (ref 0.0–0.5)
Eosinophils Relative: 0 %
HCT: 35.7 % — ABNORMAL LOW (ref 36.0–46.0)
Hemoglobin: 10.8 g/dL — ABNORMAL LOW (ref 12.0–15.0)
Immature Granulocytes: 0 %
Lymphocytes Relative: 33 %
Lymphs Abs: 1.5 10*3/uL (ref 0.7–4.0)
MCH: 25.5 pg — ABNORMAL LOW (ref 26.0–34.0)
MCHC: 30.3 g/dL (ref 30.0–36.0)
MCV: 84.4 fL (ref 80.0–100.0)
Monocytes Absolute: 0.5 10*3/uL (ref 0.1–1.0)
Monocytes Relative: 11 %
Neutro Abs: 2.5 10*3/uL (ref 1.7–7.7)
Neutrophils Relative %: 55 %
Platelet Count: 185 10*3/uL (ref 150–400)
RBC: 4.23 MIL/uL (ref 3.87–5.11)
RDW: 22.6 % — ABNORMAL HIGH (ref 11.5–15.5)
WBC Count: 4.5 10*3/uL (ref 4.0–10.5)
nRBC: 0 % (ref 0.0–0.2)

## 2022-01-04 LAB — CMP (CANCER CENTER ONLY)
ALT: 8 U/L (ref 0–44)
AST: 11 U/L — ABNORMAL LOW (ref 15–41)
Albumin: 4.4 g/dL (ref 3.5–5.0)
Alkaline Phosphatase: 65 U/L (ref 38–126)
Anion gap: 6 (ref 5–15)
BUN: 10 mg/dL (ref 6–20)
CO2: 28 mmol/L (ref 22–32)
Calcium: 9.7 mg/dL (ref 8.9–10.3)
Chloride: 105 mmol/L (ref 98–111)
Creatinine: 1.01 mg/dL — ABNORMAL HIGH (ref 0.44–1.00)
GFR, Estimated: >60 mL/min (ref >60)
Glucose, Bld: 87 mg/dL (ref 70–99)
Potassium: 3.9 mmol/L (ref 3.5–5.1)
Sodium: 139 mmol/L (ref 135–145)
Total Bilirubin: 0.3 mg/dL (ref 0.3–1.2)
Total Protein: 7.9 g/dL (ref 6.5–8.1)

## 2022-01-04 MED ORDER — FULVESTRANT 250 MG/5ML IM SOSY
500.0000 mg | PREFILLED_SYRINGE | Freq: Once | INTRAMUSCULAR | Status: AC
  Administered 2022-01-04: 500 mg via INTRAMUSCULAR
  Filled 2022-01-04: qty 10

## 2022-01-04 MED ORDER — SODIUM CHLORIDE 0.9% FLUSH
10.0000 mL | INTRAVENOUS | Status: AC | PRN
  Administered 2022-01-04: 10 mL

## 2022-01-04 MED ORDER — HEPARIN SOD (PORK) LOCK FLUSH 100 UNIT/ML IV SOLN
500.0000 [IU] | INTRAVENOUS | Status: AC | PRN
  Administered 2022-01-04: 500 [IU]

## 2022-01-04 NOTE — Progress Notes (Signed)
[place]  ?     Telephone: [phone]?Fax: [phone]  ? ?Oncology Clinical Pharmacist Practitioner Progress Note ? ?Kimberly [name] was contacted via in-person to discuss her chemotherapy regimen for abemaciclib which they receive under the care of Dr. Benay Pike. ? ?Current treatment regimen and start date ?abemaciclib (08/29/21) -- held since 10/21/22 due to radiation treatment ?fulvestrant (08/15/21) ? ?Interval History ?She continues to HOLD abemaciclib 50 mg by mouth every 12 hours on days 1 to 28 of a 28-day cycle. This is being given  in combination with fulvestrant . Therapy is planned to continue until disease progression or unacceptable toxicity.  ? ?Response to Therapy ?Kimberly [name] was seen today by clinical pharmacy to discuss restarting abemaciclib.  She has been holding this medication since December 31 as she was receiving radiation treatment up to this point.  She was last seen by clinical pharmacy on February 15 and at that time she reported having some irritation and soreness at the radiation site that had not completely healed yet.  At that time, we had discussed continuing to hold abemaciclib until her follow-up appointment with Dr. Chryl Heck later that month.  That appointment was canceled per the patient and she was rescheduled to see the nurse practitioner Thedore Mins.  Today Kimberly [name] says her wound is completely healed and she is [name] to restart abemaciclib.  She plans on restarting this tomorrow morning. Clinical pharmacy felt that this was reasonable.   ? ?She sees Dr. Isidore Moos in radiation oncology next week, and clinical pharmacy will forward our visit note from today to Dr. Isidore Moos and Dr. Chryl Heck so they are aware the patient is restarting abemaciclib.  Since it has been some time since Kimberly [name] was on abemaciclib, we again reviewed potential side effects of this agent which include but are not limited to diarrhea, nausea, neutropenia, liver toxicity,  pneumonitis, and blood clots.  We also again reviewed the importance of drinking plenty of fluids.  Kimberly [name] is due for fulvestrant today and that will be administered.  She did request to have her visits with providers scheduled after these injection appointments because she gets a port flush in the infusion area when her labs are drawn.  We felt this was reasonable.  She will tentatively see Dr. Chryl Heck with labs in 4 weeks when Kimberly [name] receives her next fulvestrant injection.  She knows to contact the clinic immediately should any new symptoms arise once restarting abemaciclib tomorrow morning.  Labs, vitals, treatment parameters, and manufacturer guidelines assessing toxicity were reviewed with Kimberly [name] today. Based on these values, patient is in agreement to continue abemaciclib therapy at this time. ? ?Allergies ?No Known Allergies ? ?Vitals ?Vitals with BMI 01/04/2022 12/19/2021 12/19/2021  ?Height '5\' 9"'$  - '5\' 9"'$   ?Weight 292 lbs 3 oz - 294 lbs 10 oz  ?BMI 43.13 - 43.49  ?Systolic 287 681 157  ?Diastolic 76 64 89  ?Pulse 90 84 92  ?  ? ?Laboratory Data ?CBC EXTENDED Latest Ref Rng & Units 01/04/2022 12/19/2021 12/06/2021  ?WBC 4.0 - 10.5 K/uL 4.5 5.3 6.7  ?RBC 3.87 - 5.11 MIL/uL 4.23 3.80(L) 3.36(L)  ?HGB 12.0 - 15.0 g/dL 10.8(L) 9.5(L) 8.0(L)  ?HCT 36.0 - 46.0 % 35.7(L) 31.3(L) 26.7(L)  ?PLT 150 - 400 K/uL 185 253 211  ?NEUTROABS 1.7 - 7.7 K/uL 2.5 3.3 4.1  ?LYMPHSABS 0.7 - 4.0 K/uL 1.5 1.3 1.6  ?  ?CMP Latest Ref Rng & Units 01/04/2022 12/19/2021 12/06/2021  ?  Glucose 70 - 99 mg/dL 87 91 90  ?BUN 6 - 20 mg/dL 10 5(L) 9  ?Creatinine 0.44 - 1.00 mg/dL 1.01(H) 1.03(H) 0.92  ?Sodium 135 - 145 mmol/L 139 138 137  ?Potassium 3.5 - 5.1 mmol/L 3.9 3.9 3.9  ?Chloride 98 - 111 mmol/L 105 105 103  ?CO2 22 - 32 mmol/L '28 30 29  '$ ?Calcium 8.9 - 10.3 mg/dL 9.7 9.3 9.3  ?Total Protein 6.5 - 8.1 g/dL 7.9 7.6 7.4  ?Total Bilirubin 0.3 - 1.2 mg/dL 0.3 0.4 0.3  ?Alkaline Phos 38 - 126 U/L 65 66 61  ?AST 15 - 41 U/L 11(L) 13(L)  10(L)  ?ALT 0 - 44 U/L '8 7 7  '$ ?  ?Lab Results  ?Component Value Date  ? MG 3.1 (H) 08/24/2020  ? ? ?Adverse Effects Assessment ?Has been off abemaciclib since 10/21/22. No adverse effects reported ? ?Adherence Assessment ?Kimberly [name] reports missing 0 doses over the past 4 weeks due to adherence. Been holding since 10/21/22.   ?Reason for missed dose: radiation treatment and delayed wound healing ?Patient was re-educated on importance of adherence.  ? ?Access Assessment ?Kimberly [name] is currently receiving her abemaciclib through  [employer] concerns:  none ? ?Medication Reconciliation ?The patient's medication list was reviewed today with the patient? Yes ?New medications or herbal supplements have recently been started? No  ?Any medications have been discontinued? No  ?The medication list was updated and reconciled based on the patient's most recent medication list in the electronic medical record (EMR) including herbal products and OTC medications.  ? ?Medications ?Current Outpatient Medications  ?Medication Sig Dispense Refill  ? abemaciclib (VERZENIO) 50 MG tablet Take 1 tablet (50 mg total) by mouth 2 (two) times daily. Swallow tablets whole. Do not chew, crush, or split tablets before swallowing. 60 tablet 6  ? acetaminophen (TYLENOL) 650 MG CR tablet Take 650 mg by mouth every 8 (eight) hours as needed for pain or fever.    ? albuterol (VENTOLIN HFA) 108 (90 Base) MCG/ACT inhaler USE 2 INHALATIONS BY MOUTH  EVERY 6 HOURS AS NEEDED FOR WHEEZING OR SHORTNESS OF  BREATH (Patient taking differently: 2 puffs every 6 (six) hours as needed for wheezing or shortness of breath.) 34 g 3  ? AMBULATORY NON FORMULARY MEDICATION Medication Name: Diltiazem 2%/ Lidocaine 5% Using your index finger, apply a small amount of medication inside the rectum up to your first knuckle/joint 4 daily x 8 weeks. 38 g 1  ? buPROPion (WELLBUTRIN XL) 150 MG 24 hr tablet Take 150 mg by mouth daily.    ? busPIRone  (BUSPAR) 15 MG tablet Take 15 mg by mouth 3 (three) times daily.    ? doxepin (SINEQUAN) 75 MG capsule Take 75 mg by mouth at bedtime.    ? FLUoxetine (PROZAC) 40 MG capsule Take 40 mg by mouth at bedtime.    ? Fulvestrant (FASLODEX IM) Inject 2 Doses into the muscle every 14 (fourteen) days. Every other Tuesday    ? hydrocortisone (ANUSOL-HC) 25 MG suppository Use every other night for 2-weeks until prescription is complete 7 suppository 0  ? lamoTRIgine (LAMICTAL) 200 MG tablet Take 200 mg by mouth at bedtime.    ? lidocaine-prilocaine (EMLA) cream Apply 1 application topically as needed. 30 g 0  ? loperamide (IMODIUM) 2 MG capsule Take 2 mg by mouth as needed for diarrhea or loose stools. Take 2 tablets (4 mg) with first sign of diarrhea, followed by 1 tablet (2 mg)  with future occurrences of diarrhea. Take up to 8 tablets (16 mg) in 24 hours.    ? methocarbamol (ROBAXIN) 750 MG tablet Take 1 tablet (750 mg total) by mouth 4 (four) times daily. 60 tablet 0  ? montelukast (SINGULAIR) 10 MG tablet Take 1 tablet (10 mg total) by mouth at bedtime. 30 tablet 3  ? ondansetron (ZOFRAN) 8 MG tablet Take 1 tablet (8 mg) by mouth every 8 hours as needed for nausea and vomiting. 30 tablet 2  ? pantoprazole (PROTONIX) 40 MG tablet Take 1 tablet (40 mg total) by mouth daily. 30 tablet 0  ? prazosin (MINIPRESS) 1 MG capsule Take 1 mg by mouth at bedtime.    ? prochlorperazine (COMPAZINE) 5 MG tablet TAKE 1 TABLET (5 MG) BY MOUTH EVERY 6 HOURS AS NEEDED FOR NAUSEA OR VOMITING. MAY TAKE 2 TABLETS (10 mg) IF 5 MG NOT SUFFICIENT. 30 tablet 2  ? QUEtiapine (SEROQUEL) 400 MG tablet Take 400 mg by mouth at bedtime.     ? rosuvastatin (CRESTOR) 10 MG tablet Take 1 tablet (10 mg total) by mouth daily. 90 tablet 1  ? Spacer/Aero-Holding Chambers (AEROCHAMBER PLUS) inhaler Use with inhaler 1 each 2  ? Tiotropium Bromide Monohydrate (SPIRIVA RESPIMAT) 1.25 MCG/ACT AERS Inhale 2 puffs into the lungs daily. 4 each 0  ? ?No current  facility-administered medications for this visit.  ? ? ?Drug-Drug Interactions (DDIs) ?DDIs were evaluated? Yes ?Significant DDIs? No  ?The patient was instructed to speak with their health care provider and/or the oral chem

## 2022-01-05 ENCOUNTER — Telehealth: Payer: Self-pay | Admitting: Hematology and Oncology

## 2022-01-05 NOTE — Telephone Encounter (Signed)
Scheduled appointment per 03/16 los. Patient aware.  ?

## 2022-01-09 ENCOUNTER — Ambulatory Visit: Payer: Medicare Other | Admitting: Radiation Oncology

## 2022-01-09 ENCOUNTER — Telehealth: Payer: Self-pay | Admitting: *Deleted

## 2022-01-09 NOTE — Telephone Encounter (Signed)
Called patient to ask about rescheduling missed fu, and patient stated that she saw pharmacist Dwaine Deter, and he said that she is completely healed and he is suppose to send Dr. Isidore Moos a note ?

## 2022-01-30 ENCOUNTER — Ambulatory Visit: Payer: Medicare Other

## 2022-02-01 ENCOUNTER — Inpatient Hospital Stay: Payer: Medicare Other | Attending: Oncology

## 2022-02-01 ENCOUNTER — Inpatient Hospital Stay (HOSPITAL_BASED_OUTPATIENT_CLINIC_OR_DEPARTMENT_OTHER): Payer: Medicare Other | Admitting: Hematology and Oncology

## 2022-02-01 ENCOUNTER — Other Ambulatory Visit: Payer: Self-pay

## 2022-02-01 ENCOUNTER — Inpatient Hospital Stay: Payer: Medicare Other

## 2022-02-01 VITALS — BP 116/60 | HR 97 | Temp 98.6°F | Resp 18

## 2022-02-01 VITALS — BP 162/96 | HR 104 | Temp 97.5°F | Resp 18 | Ht 69.0 in | Wt 293.2 lb

## 2022-02-01 DIAGNOSIS — Z5111 Encounter for antineoplastic chemotherapy: Secondary | ICD-10-CM | POA: Diagnosis not present

## 2022-02-01 DIAGNOSIS — D508 Other iron deficiency anemias: Secondary | ICD-10-CM | POA: Diagnosis not present

## 2022-02-01 DIAGNOSIS — D509 Iron deficiency anemia, unspecified: Secondary | ICD-10-CM | POA: Insufficient documentation

## 2022-02-01 DIAGNOSIS — Z95828 Presence of other vascular implants and grafts: Secondary | ICD-10-CM

## 2022-02-01 DIAGNOSIS — C50912 Malignant neoplasm of unspecified site of left female breast: Secondary | ICD-10-CM

## 2022-02-01 DIAGNOSIS — Z170 Estrogen receptor positive status [ER+]: Secondary | ICD-10-CM

## 2022-02-01 DIAGNOSIS — C792 Secondary malignant neoplasm of skin: Secondary | ICD-10-CM | POA: Diagnosis not present

## 2022-02-01 DIAGNOSIS — C50412 Malignant neoplasm of upper-outer quadrant of left female breast: Secondary | ICD-10-CM | POA: Insufficient documentation

## 2022-02-01 LAB — CBC WITH DIFFERENTIAL (CANCER CENTER ONLY)
Abs Immature Granulocytes: 0.02 10*3/uL (ref 0.00–0.07)
Basophils Absolute: 0 10*3/uL (ref 0.0–0.1)
Basophils Relative: 1 %
Eosinophils Absolute: 0 10*3/uL (ref 0.0–0.5)
Eosinophils Relative: 0 %
HCT: 36.4 % (ref 36.0–46.0)
Hemoglobin: 11.3 g/dL — ABNORMAL LOW (ref 12.0–15.0)
Immature Granulocytes: 0 %
Lymphocytes Relative: 40 %
Lymphs Abs: 1.9 10*3/uL (ref 0.7–4.0)
MCH: 25.4 pg — ABNORMAL LOW (ref 26.0–34.0)
MCHC: 31 g/dL (ref 30.0–36.0)
MCV: 81.8 fL (ref 80.0–100.0)
Monocytes Absolute: 0.5 10*3/uL (ref 0.1–1.0)
Monocytes Relative: 9 %
Neutro Abs: 2.3 10*3/uL (ref 1.7–7.7)
Neutrophils Relative %: 50 %
Platelet Count: 238 10*3/uL (ref 150–400)
RBC: 4.45 MIL/uL (ref 3.87–5.11)
RDW: 21.2 % — ABNORMAL HIGH (ref 11.5–15.5)
WBC Count: 4.8 10*3/uL (ref 4.0–10.5)
nRBC: 0 % (ref 0.0–0.2)

## 2022-02-01 LAB — CMP (CANCER CENTER ONLY)
ALT: 8 U/L (ref 0–44)
AST: 13 U/L — ABNORMAL LOW (ref 15–41)
Albumin: 4.3 g/dL (ref 3.5–5.0)
Alkaline Phosphatase: 68 U/L (ref 38–126)
Anion gap: 7 (ref 5–15)
BUN: 9 mg/dL (ref 6–20)
CO2: 28 mmol/L (ref 22–32)
Calcium: 9.5 mg/dL (ref 8.9–10.3)
Chloride: 102 mmol/L (ref 98–111)
Creatinine: 1.09 mg/dL — ABNORMAL HIGH (ref 0.44–1.00)
GFR, Estimated: >60 mL/min (ref >60)
Glucose, Bld: 112 mg/dL — ABNORMAL HIGH (ref 70–99)
Potassium: 3.8 mmol/L (ref 3.5–5.1)
Sodium: 137 mmol/L (ref 135–145)
Total Bilirubin: 0.4 mg/dL (ref 0.3–1.2)
Total Protein: 7.9 g/dL (ref 6.5–8.1)

## 2022-02-01 MED ORDER — SODIUM CHLORIDE 0.9% FLUSH
10.0000 mL | INTRAVENOUS | Status: AC | PRN
  Administered 2022-02-01: 10 mL

## 2022-02-01 MED ORDER — FULVESTRANT 250 MG/5ML IM SOSY
500.0000 mg | PREFILLED_SYRINGE | Freq: Once | INTRAMUSCULAR | Status: AC
  Administered 2022-02-01: 500 mg via INTRAMUSCULAR
  Filled 2022-02-01: qty 10

## 2022-02-01 MED ORDER — HEPARIN SOD (PORK) LOCK FLUSH 100 UNIT/ML IV SOLN
500.0000 [IU] | INTRAVENOUS | Status: AC | PRN
  Administered 2022-02-01: 500 [IU]

## 2022-02-01 NOTE — Patient Instructions (Signed)
Fulvestrant injection °What is this medication? °FULVESTRANT (ful VES trant) blocks the effects of estrogen. It is used to treat breast cancer. °This medicine may be used for other purposes; ask your health care provider or pharmacist if you have questions. °COMMON BRAND NAME(S): FASLODEX °What should I tell my care team before I take this medication? °They need to know if you have any of these conditions: °bleeding disorders °liver disease °low blood counts, like low white cell, platelet, or red cell counts °an unusual or allergic reaction to fulvestrant, other medicines, foods, dyes, or preservatives °pregnant or trying to get pregnant °breast-feeding °How should I use this medication? °This medicine is for injection into a muscle. It is usually given by a health care professional in a hospital or clinic setting. °Talk to your pediatrician regarding the use of this medicine in children. Special care may be needed. °Overdosage: If you think you have taken too much of this medicine contact a poison control center or emergency room at once. °NOTE: This medicine is only for you. Do not share this medicine with others. °What if I miss a dose? °It is important not to miss your dose. Call your doctor or health care professional if you are unable to keep an appointment. °What may interact with this medication? °medicines that treat or prevent blood clots like warfarin, enoxaparin, dalteparin, apixaban, dabigatran, and rivaroxaban °This list may not describe all possible interactions. Give your health care provider a list of all the medicines, herbs, non-prescription drugs, or dietary supplements you use. Also tell them if you smoke, drink alcohol, or use illegal drugs. Some items may interact with your medicine. °What should I watch for while using this medication? °Your condition will be monitored carefully while you are receiving this medicine. You will need important blood work done while you are taking this  medicine. °Do not become pregnant while taking this medicine or for at least 1 year after stopping it. Women of child-bearing potential will need to have a negative pregnancy test before starting this medicine. Women should inform their doctor if they wish to become pregnant or think they might be pregnant. There is a potential for serious side effects to an unborn child. Men should inform their doctors if they wish to father a child. This medicine may lower sperm counts. Talk to your health care professional or pharmacist for more information. Do not breast-feed an infant while taking this medicine or for 1 year after the last dose. °What side effects may I notice from receiving this medication? °Side effects that you should report to your doctor or health care professional as soon as possible: °allergic reactions like skin rash, itching or hives, swelling of the face, lips, or tongue °feeling faint or lightheaded, falls °pain, tingling, numbness, or weakness in the legs °signs and symptoms of infection like fever or chills; cough; flu-like symptoms; sore throat °vaginal bleeding °Side effects that usually do not require medical attention (report to your doctor or health care professional if they continue or are bothersome): °aches, pains °constipation °diarrhea °headache °hot flashes °nausea, vomiting °pain at site where injected °stomach pain °This list may not describe all possible side effects. Call your doctor for medical advice about side effects. You may report side effects to FDA at 1-800-FDA-1088. °Where should I keep my medication? °This drug is given in a hospital or clinic and will not be stored at home. °NOTE: This sheet is a summary. It may not cover all possible information. If you have   questions about this medicine, talk to your doctor, pharmacist, or health care provider. °© 2022 [employer]vier/Gold Standard (2018-01-21 00:00:00) ° °

## 2022-02-01 NOTE — Progress Notes (Signed)
[facility] Follow up: ?  ? ?Lurline Del, DO ?[place] [address] ? ? ?DIAGNOSIS:  Cancer Staging  ?No matching staging information was found for the patient. ? ?SUMMARY OF ONCOLOGIC HISTORY: ?Oncology History Overview Note  ?The patient had bilateral screening mammography with tomography 03/19/2014. This was the patient's first ever mammography. Showed a possible mass in the left breast. Left diagnostic mammography and ultrasonography 04/01/2014 showed an irregular mass in the upper outer quadrant of the left breast with a possible satellite 1 cm anterior to it. On physical exam there was a firm palpable mass at the 1:00 position of the left breast 8 cm from the nipple. There was no palpable left axillary adenopathy. Ultrasound showed an irregular hypoechoic mass in the area in question measuring 1.4 cm. There was a 4 mm nodule located anterior to this. Ultrasound of the left axilla was benign. ? 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 The second mass was negative for malignancy. This was felt to be concordant. ? ?On 04/08/2014 the patient underwent bilateral breast MRI. This showed a 9 mm enhancing mass in the subareolar area of the right breast in in the left breast, the previously noted mass measuring 1.3 cm. There were no morphologically abnormal lymph nodes ? ?The right breast finding was followed up on 04/19/2014 with ultrasound which showed an intraductal soft tissue mass in the inferior subareolar worsening of the right breast measuring 8 mm. This was biopsied 04/19/2014 and showed ([id number]) ectatic duct, with no evidence of malignancy. Surgical excision  was recommended. ? ?Accordingly on 05/03/2014 the patient underwent right lumpectomy, showing an intraductal papilloma, with known malignancy identified. Left lumpectomy on the same day showed an invasive ductal carcinoma measuring 1.5 cm, with one of the 2 sentinel lymph nodes positive for carcinoma. There was no extracapsular extension. Margins were clear and ample. HER-2 was repeated and was again negative. ? ?The patient's case was discussed in the multidisciplinary breast cancer conference 05/12/2014. An Oncotype had been previously requested and this showed a recurrent score of 22, in the intermediate range. The patient qualifies for the S1007 study and this will be discussed with her. Otherwise the standard recommendation would be chemotherapy followed by radiation followed by anti-estrogens. ?Oncotype score of 22 predicts a risk of outside the breast recurrence within 10 years of 13% if the patient's only systemic therapy is tamoxifen for 5 years. ? ?(2) adjuvant chemotherapy with cyclophosphamide and docetaxel started 07/06/2014, patient tolerated chemotherapy very poorly and it was discontinued after one cycle.   ? ?(3) adjuvant radiation completed 10/21/2014 ?1) Left Breast / 50.4 Gy in 28 fractions ?2) Left supraclavicular / 50.4 Gy in 28 fractions ?3) Left Posterior axillary boost  / 9.52 Gy in 28 fractions ?4) Left Breast Boost / 10 Gy in 5 fractions ? ?(4) anastrozole started 01/19/2015-[name] informed me she never started this.  ? ?METASTATIC DISEASE to skin: September 2022 ?(5) mammography 05/24/2021 shows fluid collection in the right axilla, no obvious malignancy within each breast but superficial skin lesions throughout the upper outer left breast. ? (A) CT scan of the abdomen and pelvis with contrast 06/10/2021 shows small low-attenuation liver lesions  which could be cysts but no definite evidence of metastatic disease. ? (B) chest CT scan 07/14/2021 shows no evidence of metastatic  disease ? (C) CT head with and without contrast on 07/15/2021 shows no evidence of metastatic disease ?(D) biopsy left breast skin lesion 07/25/2021 shows metastatic carcinoma, estrogen and progesterone receptor positive, HER2 not amplified (1+)  ?(E) excision right axillary mass 07/25/2021 shows benign epidermoid cyst ?(E) CA 27-29 on 07/05/2021 is normal at 21.1 ? ?(6) iron deficiency anemia: On 07/05/2021 the ferritin was less than 4 and the iron saturation 3%; hemoglobin was 9.1 with an MCV of 71.3 ? (A) Venofer started on 07/18/2021, to be repeated x5  ? (B) GI evaluation 08/11/2021 ? ?(7) to start fulvestrant 09/13/2021 ? (A) to start abemaciclib 50 mg twice daily starting 09/13/2021 ? ?(8) status post left mastectomy 08/28/2021 for a pT3 pNX invasive lobular carcinoma, grade 2, with negative margins. ?  ?Recurrent breast cancer, left ([place])  ?04/14/2014 Initial Diagnosis  ? Recurrent breast cancer, left ([place]) ?  ? ? ?CURRENT THERAPY: Fulvestrant  ? ?INTERVAL HISTORY: ?Marcena Dias 51 y.o. female returns for evaluation while on Verzenio and  ?Anastrozole.  She is tolerating the current dose of Verzenio well 50 mg p.o. twice daily however she has been taking nausea medication scheduled every day twice a day.  She denies any complaints today including nausea, heartburn, diarrhea.  She has some baseline diarrhea which has not gotten any worse. ?Rest of the pertinent 10 point ROS reviewed and negative ? ?Patient Active Problem List  ? Diagnosis Date Noted  ? S/P mastectomy, left 08/28/2021  ? Goals of care, counseling/discussion 07/18/2021  ? Microcytic anemia 07/05/2021  ? Left corneal abrasion 06/30/2021  ? History of COVID-19 01/27/2021  ? Elevated LDL cholesterol level 12/30/2020  ? COPD exacerbation ([place]) 08/24/2020  ? Oligouria 03/10/2020  ? History of breast cancer 02/11/2020  ? Total body pain 01/13/2019  ? Postmenopausal bleeding 10/25/2017  ? Chronic pain of both knees 05/03/2017  ? COPD (chronic  obstructive pulmonary disease) ([place]) 03/20/2017  ? Tachycardia   ? Mixed incontinence 09/29/2015  ? Genetic testing 08/26/2015  ? Recurrent breast cancer, left ([place]) 04/14/2014  ? Disorder of bladder 09/29/2008  ? LOW BACK PAIN, CHRONIC 09/29/2008  ? Morbid (severe) obesity due to excess calories ([place]) 11/19/2007  ? ANEMIA, IRON DEFICIENCY, CHRONIC 11/14/2007  ? Mixed bipolar I disorder ([place]) 02/20/2007  ? Tobacco use disorder 02/20/2007  ? ? ?has No Known Allergies. ? ?MEDICAL HISTORY: ?Past Medical History:  ?Diagnosis Date  ? Acute cholecystitis 04/17/2019  ? Anemia   ? Anxiety   ? Arthritis   ? Bipolar 1 disorder ([place])   ? Blood transfusion without reported diagnosis 2007  ? post bleeding childbirth  ? Cancer [facility])   ? left breast cancer 2015  ? COPD (chronic obstructive pulmonary disease) ([place])   ? pt reports she is not on oxygen  ? COVID-19 virus infection 06/07/2019  ? Depression   ? Diabetes mellitus without complication ([place])   ? gestational  ? Hand injury, right, initial encounter 03/19/2019  ? Heart murmur   ? as a child-not adult-no cardiac work up  ? History of radiation therapy 08/31/14-10/21/14  ? left breast/ left supraclavicular 50.4 Gy 28 fx, lef tposterior axillary boost 9.52 Gy 28 fx, left rbeast boost/ 10 Gy 5 fx  ? Personal history of chemotherapy   ? Personal history of radiation therapy   ? Rash and nonspecific skin eruption 12/27/2020  ? ? ?  SURGICAL HISTORY: ?Past Surgical History:  ?Procedure Laterality Date  ? BREAST BIOPSY Left 04/01/14  ? BREAST BIOPSY Right 04/19/14  ? BREAST LUMPECTOMY WITH AXILLARY LYMPH NODE DISSECTION Bilateral 05/03/14  ? CHOLECYSTECTOMY N/A 04/18/2019  ? Procedure: LAPAROSCOPIC CHOLECYSTECTOMY WITH INTRAOPERATIVE CHOLANGIOGRAM;  Surgeon: Jovita Kussmaul, MD;  Location: [facility];  Service: General;  Laterality: N/A;  ? DILATION AND CURETTAGE OF UTERUS    ? after childbirth  ? MASS EXCISION Right 07/25/2021  ? Procedure: RIGHT AXILLARY MASS EXCISION;  Surgeon: [name], MD;  Location: [facility];  Service: General;  Laterality: Right;  ? MINOR BREAST BIOPSY Left 07/25/2021  ? Procedure: SKIN PUNCH BIOPSY LEFT BREAST;  Surgeon: [name], MD;  Location: [facility];  Service: Stormy Fab[place]

## 2022-02-02 ENCOUNTER — Encounter: Payer: Self-pay | Admitting: Adult Health

## 2022-02-02 ENCOUNTER — Other Ambulatory Visit: Payer: Self-pay | Admitting: Pharmacist

## 2022-02-02 ENCOUNTER — Encounter: Payer: Self-pay | Admitting: Hematology and Oncology

## 2022-02-02 ENCOUNTER — Other Ambulatory Visit (HOSPITAL_COMMUNITY): Payer: Self-pay

## 2022-02-02 DIAGNOSIS — Z170 Estrogen receptor positive status [ER+]: Secondary | ICD-10-CM

## 2022-02-02 MED ORDER — ABEMACICLIB 50 MG PO TABS: 50.0000 mg | ORAL_TABLET | Freq: Two times a day (BID) | ORAL | 6 refills | Status: DC

## 2022-02-02 MED ORDER — ABEMACICLIB 50 MG PO TABS
50.0000 mg | ORAL_TABLET | Freq: Two times a day (BID) | ORAL | 6 refills | Status: DC
  Filled 2022-02-02: qty 70, 35d supply, fill #0

## 2022-02-02 NOTE — Progress Notes (Signed)
Oral Oncology Pharmacist Encounter ? ?Prescription refill for Verzenio (abemaciclib) sent to [facility] in error. Patient enrolled in manufacturer assistance and receives medication through [employer]. Prescription redirected to [facility] for dispensing. ? ?Leron Croak, PharmD, BCPS ?Hematology/Oncology Clinical Pharmacist ?[facility] ?[phone] ?02/02/2022 9:53 AM ? ? ? ?

## 2022-02-28 ENCOUNTER — Inpatient Hospital Stay: Payer: Medicare Other

## 2022-02-28 ENCOUNTER — Inpatient Hospital Stay: Payer: Medicare Other | Attending: Oncology | Admitting: Pharmacist

## 2022-02-28 DIAGNOSIS — C50412 Malignant neoplasm of upper-outer quadrant of left female breast: Secondary | ICD-10-CM | POA: Insufficient documentation

## 2022-02-28 DIAGNOSIS — Z5111 Encounter for antineoplastic chemotherapy: Secondary | ICD-10-CM | POA: Insufficient documentation

## 2022-03-01 ENCOUNTER — Inpatient Hospital Stay: Payer: Medicare Other

## 2022-03-01 ENCOUNTER — Other Ambulatory Visit: Payer: Self-pay

## 2022-03-01 VITALS — BP 160/95 | HR 102 | Temp 98.5°F | Resp 18

## 2022-03-01 DIAGNOSIS — C50412 Malignant neoplasm of upper-outer quadrant of left female breast: Secondary | ICD-10-CM | POA: Diagnosis not present

## 2022-03-01 DIAGNOSIS — D509 Iron deficiency anemia, unspecified: Secondary | ICD-10-CM

## 2022-03-01 DIAGNOSIS — Z5111 Encounter for antineoplastic chemotherapy: Secondary | ICD-10-CM | POA: Diagnosis not present

## 2022-03-01 DIAGNOSIS — D508 Other iron deficiency anemias: Secondary | ICD-10-CM

## 2022-03-01 DIAGNOSIS — C50912 Malignant neoplasm of unspecified site of left female breast: Secondary | ICD-10-CM

## 2022-03-01 LAB — CMP (CANCER CENTER ONLY)
ALT: 8 U/L (ref 0–44)
AST: 11 U/L — ABNORMAL LOW (ref 15–41)
Albumin: 4.5 g/dL (ref 3.5–5.0)
Alkaline Phosphatase: 67 U/L (ref 38–126)
Anion gap: 6 (ref 5–15)
BUN: 7 mg/dL (ref 6–20)
CO2: 29 mmol/L (ref 22–32)
Calcium: 9.9 mg/dL (ref 8.9–10.3)
Chloride: 104 mmol/L (ref 98–111)
Creatinine: 1.11 mg/dL — ABNORMAL HIGH (ref 0.44–1.00)
GFR, Estimated: >60 mL/min (ref >60)
Glucose, Bld: 92 mg/dL (ref 70–99)
Potassium: 4.4 mmol/L (ref 3.5–5.1)
Sodium: 139 mmol/L (ref 135–145)
Total Bilirubin: 0.3 mg/dL (ref 0.3–1.2)
Total Protein: 8.6 g/dL — ABNORMAL HIGH (ref 6.5–8.1)

## 2022-03-01 LAB — CBC WITH DIFFERENTIAL (CANCER CENTER ONLY)
Abs Immature Granulocytes: 0.03 10*3/uL (ref 0.00–0.07)
Basophils Absolute: 0 10*3/uL (ref 0.0–0.1)
Basophils Relative: 0 %
Eosinophils Absolute: 0 10*3/uL (ref 0.0–0.5)
Eosinophils Relative: 0 %
HCT: 38.5 % (ref 36.0–46.0)
Hemoglobin: 12.2 g/dL (ref 12.0–15.0)
Immature Granulocytes: 0 %
Lymphocytes Relative: 35 %
Lymphs Abs: 2.6 10*3/uL (ref 0.7–4.0)
MCH: 25.7 pg — ABNORMAL LOW (ref 26.0–34.0)
MCHC: 31.7 g/dL (ref 30.0–36.0)
MCV: 81.1 fL (ref 80.0–100.0)
Monocytes Absolute: 0.5 10*3/uL (ref 0.1–1.0)
Monocytes Relative: 7 %
Neutro Abs: 4.2 10*3/uL (ref 1.7–7.7)
Neutrophils Relative %: 58 %
Platelet Count: 256 10*3/uL (ref 150–400)
RBC: 4.75 MIL/uL (ref 3.87–5.11)
RDW: 20.1 % — ABNORMAL HIGH (ref 11.5–15.5)
WBC Count: 7.4 10*3/uL (ref 4.0–10.5)
nRBC: 0 % (ref 0.0–0.2)

## 2022-03-01 LAB — VITAMIN B12: Vitamin B-12: 113 pg/mL — ABNORMAL LOW (ref 180–914)

## 2022-03-01 MED ORDER — FULVESTRANT 250 MG/5ML IM SOSY
500.0000 mg | PREFILLED_SYRINGE | Freq: Once | INTRAMUSCULAR | Status: AC
  Administered 2022-03-01: 500 mg via INTRAMUSCULAR
  Filled 2022-03-01: qty 10

## 2022-03-01 NOTE — Progress Notes (Signed)
Patient states her diet may be effecting her B/P due to salt intake the reason for elevated B/P ?

## 2022-03-02 ENCOUNTER — Telehealth: Payer: Self-pay | Admitting: *Deleted

## 2022-03-02 NOTE — Telephone Encounter (Signed)
-----   Message from Benay Pike, MD sent at 03/01/2022  4:53 PM EDT ----- ?Val, can we ask her to start some B12 supplementation ? ?1000 mcg daily is a good start ? ?Thanks, ?

## 2022-03-02 NOTE — Telephone Encounter (Signed)
Pt contacted and informed of low B12 - with MD recommendation for '1000mg'$  daily replacement by OTC supplement. ? ?Pt verbalized understanding. ?

## 2022-03-07 ENCOUNTER — Encounter: Payer: Self-pay | Admitting: Family Medicine

## 2022-03-07 ENCOUNTER — Ambulatory Visit (INDEPENDENT_AMBULATORY_CARE_PROVIDER_SITE_OTHER): Payer: Medicare Other | Admitting: Family Medicine

## 2022-03-07 VITALS — BP 147/82 | HR 62 | Wt 287.5 lb

## 2022-03-07 DIAGNOSIS — E538 Deficiency of other specified B group vitamins: Secondary | ICD-10-CM | POA: Diagnosis not present

## 2022-03-07 DIAGNOSIS — M25561 Pain in right knee: Secondary | ICD-10-CM | POA: Diagnosis not present

## 2022-03-07 DIAGNOSIS — D509 Iron deficiency anemia, unspecified: Secondary | ICD-10-CM | POA: Diagnosis not present

## 2022-03-07 DIAGNOSIS — G8929 Other chronic pain: Secondary | ICD-10-CM | POA: Diagnosis not present

## 2022-03-07 MED ORDER — CYANOCOBALAMIN 1000 MCG/ML IJ SOLN
1000.0000 ug | Freq: Once | INTRAMUSCULAR | Status: AC
  Administered 2022-03-07: 1000 ug via INTRAMUSCULAR

## 2022-03-07 NOTE — Progress Notes (Signed)
? ? ?  SUBJECTIVE:  ? ?CHIEF COMPLAINT / HPI:  ? ?"Check iron level and discuss B12 shots": ?51 year old female with a history of breast cancer undergoing chemotherapy presenting for the above.  Previously had B12 level of 113 on 03/01/2022 and was started on B12 supplementation.  At that time her hemoglobin was 12.2 with an MCV of 81.  Previous iron studies on 11/07/2021 when she was anemic showed an iron of 19, TIBC of 500, saturation ratio of 4 and ferritin of less than 4. She states she is interested in B12 shots. ? ?"Cortisone shot in right knee": ?Patient states she has a history of chronic knee pain in her right knee.  Presents today requesting a cortisone shot.  She has previously had a few of these with good results. I reached out to her oncologist prior to this appointment who recommended checking a CBC prior to performing the cortisone shot as sometimes her chemotherapy can calls thrombocytopenia.  The oncologist recommended that should be okay to proceed with cortisone injection if her platelets are greater than 50,000. ? ?PERTINENT  PMH / PSH: Breast cancer ? ?OBJECTIVE:  ? ?BP (!) 147/82   Pulse 62   Wt 287 lb 8 oz (130.4 kg)   SpO2 99%   BMI 42.46 kg/m?   ? ?General: NAD, pleasant, able to participate in exam ?Cardiac: RRR, no murmurs. ?Respiratory: CTAB, normal effort, No wheezes, rales or rhonchi ?MSK: Knee, right: Inspection was negative for erythema, ecchymosis, and effusion. No obvious bony abnormalities or signs of osteophyte development. Palpation yielded no asymmetric warmth; positive joint line tenderness worse on the lateral aspect; No condyle tenderness; No patellar tenderness; No patellar crepitus. Patellar and quadriceps tendons unremarkable, and no tenderness of the pes anserine bursa. No obvious Baker's cyst development. ROM normal in flexion (135 degrees) and extension (0 degrees). Normal hamstring and quadriceps strength. Neurovascularly intact bilaterally. ?Skin: warm and dry, no  rashes noted ?Neuro: alert, no obvious focal deficits ?Psych: Normal affect and mood ? ?ASSESSMENT/PLAN:  ?  ?Chronic right knee pain: ?Patient initially presenting for cortisone injection.  She is currently undergoing chemotherapy for breast cancer.  Per her oncologist a CBC should be checked first which we will do today.  If her platelets are greater than 50,000 we can proceed with cortisone injection at follow-up appointment.  She is going to schedule an appointment for tomorrow afternoon or Friday to receive knee injection.  Recommended using Voltaren gel in the meantime. ? ?Low B12: ?Has been supplementing oral for the past few days but is interested in B12 shots.  Discussed with her that I think these are reasonable.  B12 injection provided. Will schedule her for nurse clinic for B12 1,'000mg'$  weekly for 4 weeks and recheck.  ? ?Iron deficiency anemia: ?Presents today for recheck of levels after receiving IV iron supplementation.  We will check ferritin, iron, and CBC as noted above. ? ?Lurline Del, DO ?[place]  ? ? ? ?

## 2022-03-07 NOTE — Addendum Note (Signed)
Addended by: Salvatore Marvel on: 03/07/2022 02:26 PM ? ? Modules accepted: Orders ? ?

## 2022-03-07 NOTE — Patient Instructions (Signed)
For your knee pain I recommend getting over-the-counter diclofenac also called Voltaren gel to use on it.  We are going to check your blood counts today and if they are all right I would like for you to have a follow-up tomorrow afternoon or Friday to likely get a knee injection. ? ?We are going to give your first B12 shot today and I want you to come back weekly for the next 3 weeks for B12 injections with our nurse clinic.  We will then recheck the level in about a month. ? ?We are also checking your blood counts and iron levels and I will let you know these results when they return. ?

## 2022-03-08 ENCOUNTER — Ambulatory Visit (INDEPENDENT_AMBULATORY_CARE_PROVIDER_SITE_OTHER): Payer: Medicare Other | Admitting: Family Medicine

## 2022-03-08 ENCOUNTER — Other Ambulatory Visit: Payer: Self-pay | Admitting: Family Medicine

## 2022-03-08 ENCOUNTER — Other Ambulatory Visit: Payer: Self-pay

## 2022-03-08 DIAGNOSIS — G8929 Other chronic pain: Secondary | ICD-10-CM | POA: Diagnosis not present

## 2022-03-08 DIAGNOSIS — M25561 Pain in right knee: Secondary | ICD-10-CM

## 2022-03-08 LAB — IRON: Iron: 20 ug/dL — ABNORMAL LOW (ref 27–159)

## 2022-03-08 LAB — FERRITIN: Ferritin: 7 ng/mL — ABNORMAL LOW (ref 15–150)

## 2022-03-08 LAB — CBC
Hematocrit: 34.7 % (ref 34.0–46.6)
Hemoglobin: 11.1 g/dL (ref 11.1–15.9)
MCH: 25.7 pg — ABNORMAL LOW (ref 26.6–33.0)
MCHC: 32 g/dL (ref 31.5–35.7)
MCV: 80 fL (ref 79–97)
Platelets: 266 10*3/uL (ref 150–450)
RBC: 4.32 x10E6/uL (ref 3.77–5.28)
RDW: 19.6 % — ABNORMAL HIGH (ref 11.7–15.4)
WBC: 6.8 10*3/uL (ref 3.4–10.8)

## 2022-03-08 MED ORDER — METHYLPREDNISOLONE ACETATE 40 MG/ML IJ SUSP
40.0000 mg | Freq: Once | INTRAMUSCULAR | Status: AC
  Administered 2022-03-08: 40 mg via INTRAMUSCULAR

## 2022-03-08 MED ORDER — FERROUS SULFATE 324 MG PO TBEC: 324.0000 mg | DELAYED_RELEASE_TABLET | ORAL | 1 refills | Status: DC

## 2022-03-08 NOTE — Assessment & Plan Note (Signed)
Chronic R knee pain, likely OA, acutely worse. Patient would like CSI, which has worked well for her in the past. Consented to procedure, see below.

## 2022-03-08 NOTE — Addendum Note (Signed)
Addended by: Junious Dresser on: 03/08/2022 04:59 PM   Modules accepted: Orders

## 2022-03-08 NOTE — Progress Notes (Signed)
    SUBJECTIVE:   CHIEF COMPLAINT / HPI:   Chronic knee pain: patient has a h/o chronic knee pain with good response to CSI. Last CSI in May 2021, 2 years ago. She is requesting a CSI today as she has had more acute knee pain. Patient is currently undergoing chemotherapy for breast cancer. Dr. Vanessa [place] contacted her oncologist and asked about safety of CSI, and oncology recommended a CBC prior to injection to evaluate for thrombocytopenia. Patient can have CSI if platelets are >50K. Fortunately, CBC from yesterday shows platelets are normal at 266. Plan to proceed with CSI of R knee.  HC maintenance: reminded patient to schedule pap smear at her earliest convenience. Last pap smear was in May 2015, NILM.  PERTINENT  PMH / PSH: breast cancer, anemia, chronic knee pain  OBJECTIVE:   BP (!) 158/83   Pulse (!) 104   Wt 292 lb (132.5 kg)   SpO2 98%   BMI 43.12 kg/m   Nursing note and vitals reviewed GEN: middle-aged woman, resting comfortably in chair, NAD, class III obesity HEENT: NCAT. PERRLA. Sclera without injection or icterus. MMM.  Neuro: AOx3  R knee: No gross deformity, ecchymoses, mild lateral swelling. + mild TTP . FROM with normal strength. Negative ant/post drawers. Negative valgus/varus testing. Negative lachman.  NV intact distally. Psych: Pleasant and appropriate   ASSESSMENT/PLAN:   Chronic pain of right knee Chronic R knee pain, likely OA, acutely worse. Patient would like CSI, which has worked well for her in the past. Consented to procedure, see below.    PROCEDURE: INJECTION: Patient was given informed consent, signed copy in the chart. Appropriate time out was taken. Area prepped and draped in usual sterile fashion. Ethyl chloride was  used for local anesthesia. A 21 gauge 1 1/2 inch needle was used.Marland Kitchen 4 cc of methylprednisolone 40 mg/ml plus  1 cc of 1% lidocaine without epinephrine was injected into the R knee using a(n) anterior, medial approach.   The patient  tolerated the procedure well. There were no complications. Post procedure instructions were given.   Gladys Damme, MD [place]

## 2022-03-08 NOTE — Patient Instructions (Signed)
Joint Injection, Care After Refer to this sheet in the next few weeks. These instructions provide you with information about caring for yourself after your procedure. Your health care provider may also give you more specific instructions. Your treatment has been planned according to current medical practices, but problems sometimes occur. Call your health care provider if you have any problems or questions after your procedure. What can I expect after the procedure? After the procedure, it is common to have: Soreness. Warmth. Swelling. You may have more pain, swelling, and warmth than you did before the injection. This reaction may last for about one day. Follow these instructions at home: Bathing If you were given a bandage (dressing), keep it dry until your health care provider says it can be removed. Ask your health care provider when you can start showering or taking a bath. Managing pain, stiffness, and swelling If directed, apply ice to the injection area: Put ice in a plastic bag. Place a towel between your skin and the bag. Leave the ice on for 20 minutes, 2-3 times per day. Do not apply heat to your knee. Raise the injection area above the level of your heart while you are sitting or lying down. Activity Avoid strenuous activities for as long as directed by your health care provider. Ask your health care provider when you can return to your normal activities. General instructions Take medicines only as directed by your health care provider. Do not take aspirin or other over-the-counter medicines unless your health care provider says you can. Check your injection site every day for signs of infection. Watch for: Redness, swelling, or pain. Fluid, blood, or pus. Follow your health care provider's instructions about dressing changes and removal. Contact a health care provider if: You have symptoms at your injection site that last longer than two days after your procedure. You have  redness, swelling, or pain in your injection area. You have fluid, blood, or pus coming from your injection site. You have warmth in your injection area. You have a fever. Your pain is not controlled with medicine.

## 2022-03-28 ENCOUNTER — Telehealth: Payer: Self-pay | Admitting: *Deleted

## 2022-03-28 ENCOUNTER — Inpatient Hospital Stay: Payer: Medicare Other | Attending: Oncology

## 2022-03-28 ENCOUNTER — Inpatient Hospital Stay: Payer: Medicare Other | Admitting: Hematology and Oncology

## 2022-03-28 ENCOUNTER — Inpatient Hospital Stay: Payer: Medicare Other

## 2022-03-28 DIAGNOSIS — C50412 Malignant neoplasm of upper-outer quadrant of left female breast: Secondary | ICD-10-CM | POA: Insufficient documentation

## 2022-03-28 DIAGNOSIS — Z5111 Encounter for antineoplastic chemotherapy: Secondary | ICD-10-CM | POA: Insufficient documentation

## 2022-03-28 NOTE — Telephone Encounter (Signed)
Per flush/inj nurse notification of no show for appts today - this RN sent an urgent LOS requesting appt to be rescheduled.

## 2022-03-29 ENCOUNTER — Telehealth: Payer: Self-pay | Admitting: Hematology and Oncology

## 2022-03-29 NOTE — Telephone Encounter (Signed)
.  Called patient to schedule appointment per 6/7 inbasket, patient is aware of date and time.   

## 2022-04-02 ENCOUNTER — Inpatient Hospital Stay: Payer: Medicare Other

## 2022-04-02 ENCOUNTER — Other Ambulatory Visit: Payer: Self-pay

## 2022-04-02 VITALS — BP 133/94 | HR 109 | Temp 97.9°F | Resp 18

## 2022-04-02 DIAGNOSIS — Z5111 Encounter for antineoplastic chemotherapy: Secondary | ICD-10-CM | POA: Diagnosis not present

## 2022-04-02 DIAGNOSIS — C50412 Malignant neoplasm of upper-outer quadrant of left female breast: Secondary | ICD-10-CM | POA: Diagnosis not present

## 2022-04-02 DIAGNOSIS — D509 Iron deficiency anemia, unspecified: Secondary | ICD-10-CM

## 2022-04-02 DIAGNOSIS — C50912 Malignant neoplasm of unspecified site of left female breast: Secondary | ICD-10-CM

## 2022-04-02 LAB — CMP (CANCER CENTER ONLY)
ALT: 6 U/L (ref 0–44)
AST: 10 U/L — ABNORMAL LOW (ref 15–41)
Albumin: 4.4 g/dL (ref 3.5–5.0)
Alkaline Phosphatase: 68 U/L (ref 38–126)
Anion gap: 5 (ref 5–15)
BUN: 6 mg/dL (ref 6–20)
CO2: 28 mmol/L (ref 22–32)
Calcium: 10 mg/dL (ref 8.9–10.3)
Chloride: 104 mmol/L (ref 98–111)
Creatinine: 1.3 mg/dL — ABNORMAL HIGH (ref 0.44–1.00)
GFR, Estimated: 50 mL/min — ABNORMAL LOW (ref >60)
Glucose, Bld: 107 mg/dL — ABNORMAL HIGH (ref 70–99)
Potassium: 3.9 mmol/L (ref 3.5–5.1)
Sodium: 137 mmol/L (ref 135–145)
Total Bilirubin: 0.3 mg/dL (ref 0.3–1.2)
Total Protein: 7.8 g/dL (ref 6.5–8.1)

## 2022-04-02 LAB — CBC WITH DIFFERENTIAL (CANCER CENTER ONLY)
Abs Immature Granulocytes: 0.03 10*3/uL (ref 0.00–0.07)
Basophils Absolute: 0 10*3/uL (ref 0.0–0.1)
Basophils Relative: 1 %
Eosinophils Absolute: 0 10*3/uL (ref 0.0–0.5)
Eosinophils Relative: 0 %
HCT: 34.4 % — ABNORMAL LOW (ref 36.0–46.0)
Hemoglobin: 11 g/dL — ABNORMAL LOW (ref 12.0–15.0)
Immature Granulocytes: 0 %
Lymphocytes Relative: 31 %
Lymphs Abs: 2.3 10*3/uL (ref 0.7–4.0)
MCH: 25.2 pg — ABNORMAL LOW (ref 26.0–34.0)
MCHC: 32 g/dL (ref 30.0–36.0)
MCV: 78.9 fL — ABNORMAL LOW (ref 80.0–100.0)
Monocytes Absolute: 0.7 10*3/uL (ref 0.1–1.0)
Monocytes Relative: 9 %
Neutro Abs: 4.5 10*3/uL (ref 1.7–7.7)
Neutrophils Relative %: 59 %
Platelet Count: 264 10*3/uL (ref 150–400)
RBC: 4.36 MIL/uL (ref 3.87–5.11)
RDW: 18.4 % — ABNORMAL HIGH (ref 11.5–15.5)
WBC Count: 7.5 10*3/uL (ref 4.0–10.5)
nRBC: 0 % (ref 0.0–0.2)

## 2022-04-02 MED ORDER — FULVESTRANT 250 MG/5ML IM SOSY
500.0000 mg | PREFILLED_SYRINGE | Freq: Once | INTRAMUSCULAR | Status: AC
  Administered 2022-04-02: 500 mg via INTRAMUSCULAR
  Filled 2022-04-02: qty 10

## 2022-04-02 NOTE — Progress Notes (Unsigned)
Pt requested labs be drawn peripherally and did not want her port accessed. Port flush w/lab appt canceled.

## 2022-04-25 ENCOUNTER — Other Ambulatory Visit: Payer: Self-pay

## 2022-04-25 ENCOUNTER — Inpatient Hospital Stay: Payer: Medicare Other

## 2022-04-27 ENCOUNTER — Telehealth: Payer: Self-pay | Admitting: *Deleted

## 2022-04-27 NOTE — Telephone Encounter (Signed)
This RN spoke with pt per her call stating concern due to " having some new bumps showing up again on my chest and just want to get them checked out"  This RN reviewed chart and noted pt was scheduled early June with no show- pt is under therapy with faslodex and abemaciclib.  Noted pt is scheduled for injection only on 7/10.  This RN scheduled pt for lab visit and injection on 7/11 per availability of provider- while on the phone with the patient with her verbalizing appt date and time.  No further needs at this time.

## 2022-04-30 ENCOUNTER — Ambulatory Visit: Payer: Medicare Other

## 2022-05-01 ENCOUNTER — Inpatient Hospital Stay: Payer: Medicare Other

## 2022-05-01 ENCOUNTER — Inpatient Hospital Stay: Payer: Medicare Other | Attending: Oncology | Admitting: Hematology and Oncology

## 2022-05-01 ENCOUNTER — Other Ambulatory Visit: Payer: Self-pay

## 2022-05-01 VITALS — BP 136/83

## 2022-05-01 VITALS — BP 163/97 | HR 99 | Temp 98.2°F | Resp 18 | Ht 69.0 in | Wt 284.2 lb

## 2022-05-01 DIAGNOSIS — Z170 Estrogen receptor positive status [ER+]: Secondary | ICD-10-CM | POA: Insufficient documentation

## 2022-05-01 DIAGNOSIS — D509 Iron deficiency anemia, unspecified: Secondary | ICD-10-CM | POA: Insufficient documentation

## 2022-05-01 DIAGNOSIS — Z79811 Long term (current) use of aromatase inhibitors: Secondary | ICD-10-CM | POA: Insufficient documentation

## 2022-05-01 DIAGNOSIS — C50912 Malignant neoplasm of unspecified site of left female breast: Secondary | ICD-10-CM

## 2022-05-01 DIAGNOSIS — C792 Secondary malignant neoplasm of skin: Secondary | ICD-10-CM | POA: Insufficient documentation

## 2022-05-01 DIAGNOSIS — Z5111 Encounter for antineoplastic chemotherapy: Secondary | ICD-10-CM | POA: Insufficient documentation

## 2022-05-01 DIAGNOSIS — C50412 Malignant neoplasm of upper-outer quadrant of left female breast: Secondary | ICD-10-CM | POA: Insufficient documentation

## 2022-05-01 LAB — CBC WITH DIFFERENTIAL (CANCER CENTER ONLY)
Abs Immature Granulocytes: 0.02 10*3/uL (ref 0.00–0.07)
Basophils Absolute: 0 10*3/uL (ref 0.0–0.1)
Basophils Relative: 1 %
Eosinophils Absolute: 0.2 10*3/uL (ref 0.0–0.5)
Eosinophils Relative: 2 %
HCT: 33.8 % — ABNORMAL LOW (ref 36.0–46.0)
Hemoglobin: 10.8 g/dL — ABNORMAL LOW (ref 12.0–15.0)
Immature Granulocytes: 0 %
Lymphocytes Relative: 32 %
Lymphs Abs: 2.3 10*3/uL (ref 0.7–4.0)
MCH: 24.3 pg — ABNORMAL LOW (ref 26.0–34.0)
MCHC: 32 g/dL (ref 30.0–36.0)
MCV: 76 fL — ABNORMAL LOW (ref 80.0–100.0)
Monocytes Absolute: 0.6 10*3/uL (ref 0.1–1.0)
Monocytes Relative: 8 %
Neutro Abs: 4.2 10*3/uL (ref 1.7–7.7)
Neutrophils Relative %: 57 %
Platelet Count: 281 10*3/uL (ref 150–400)
RBC: 4.45 MIL/uL (ref 3.87–5.11)
RDW: 18.8 % — ABNORMAL HIGH (ref 11.5–15.5)
WBC Count: 7.2 10*3/uL (ref 4.0–10.5)
nRBC: 0 % (ref 0.0–0.2)

## 2022-05-01 LAB — CMP (CANCER CENTER ONLY)
ALT: 5 U/L (ref 0–44)
AST: 11 U/L — ABNORMAL LOW (ref 15–41)
Albumin: 4.5 g/dL (ref 3.5–5.0)
Alkaline Phosphatase: 64 U/L (ref 38–126)
Anion gap: 8 (ref 5–15)
BUN: 7 mg/dL (ref 6–20)
CO2: 26 mmol/L (ref 22–32)
Calcium: 9.9 mg/dL (ref 8.9–10.3)
Chloride: 102 mmol/L (ref 98–111)
Creatinine: 1.35 mg/dL — ABNORMAL HIGH (ref 0.44–1.00)
GFR, Estimated: 48 mL/min — ABNORMAL LOW (ref >60)
Glucose, Bld: 94 mg/dL (ref 70–99)
Potassium: 3.5 mmol/L (ref 3.5–5.1)
Sodium: 136 mmol/L (ref 135–145)
Total Bilirubin: 0.5 mg/dL (ref 0.3–1.2)
Total Protein: 8.3 g/dL — ABNORMAL HIGH (ref 6.5–8.1)

## 2022-05-01 MED ORDER — FULVESTRANT 250 MG/5ML IM SOSY
500.0000 mg | PREFILLED_SYRINGE | Freq: Once | INTRAMUSCULAR | Status: AC
  Administered 2022-05-01: 500 mg via INTRAMUSCULAR
  Filled 2022-05-01: qty 10

## 2022-05-01 MED ORDER — DOXYCYCLINE HYCLATE 100 MG PO TABS: 100.0000 mg | ORAL_TABLET | Freq: Two times a day (BID) | ORAL | 0 refills | Status: DC

## 2022-05-01 NOTE — Progress Notes (Signed)
[facility] Cancer Follow up:    [name], [name], MD [address] [place] [address]   DIAGNOSIS:  Cancer Staging  No matching staging information was found for the patient.  SUMMARY OF ONCOLOGIC HISTORY: Oncology History Overview Note  The patient had bilateral screening mammography with tomography 03/19/2014. This was the patient's first ever mammography. Showed a possible mass in the left breast. Left diagnostic mammography and ultrasonography 04/01/2014 showed an irregular mass in the upper outer quadrant of the left breast with a possible satellite 1 cm anterior to it. On physical exam there was a firm palpable mass at the 1:00 position of the left breast 8 cm from the nipple. There was no palpable left axillary adenopathy. Ultrasound showed an irregular hypoechoic mass in the area in question measuring 1.4 cm. There was a 4 mm nodule located anterior to this. Ultrasound of the left axilla was benign.  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 The second mass was negative for malignancy. This was felt to be concordant.  On 04/08/2014 the patient underwent bilateral breast MRI. This showed a 9 mm enhancing mass in the subareolar area of the right breast in in the left breast, the previously noted mass measuring 1.3 cm. There were no morphologically abnormal lymph nodes  The right breast finding was followed up on 04/19/2014 with ultrasound which showed an intraductal soft tissue mass in the inferior subareolar worsening of the right breast measuring 8 mm. This was biopsied 04/19/2014 and showed ([id number]) ectatic duct, with no evidence of malignancy. Surgical excision  was recommended.  Accordingly on 05/03/2014 the patient underwent right lumpectomy, showing an intraductal papilloma, with known malignancy identified. Left lumpectomy on the same day showed an invasive ductal carcinoma measuring 1.5 cm, with one of the 2 sentinel lymph nodes positive for carcinoma. There was no extracapsular extension. Margins were clear and ample. HER-2 was repeated and was again negative.  The patient's case was discussed in the multidisciplinary breast cancer conference 05/12/2014. An Oncotype had been previously requested and this showed a recurrent score of 22, in the intermediate range. The patient qualifies for the S1007 study and this will be discussed with her. Otherwise the standard recommendation would be chemotherapy followed by radiation followed by anti-estrogens. Oncotype score of 22 predicts a risk of outside the breast recurrence within 10 years of 13% if the patient's only systemic therapy is tamoxifen for 5 years.  (2) adjuvant chemotherapy with cyclophosphamide and docetaxel started 07/06/2014, patient tolerated chemotherapy very poorly and it was discontinued after one cycle.    (3) adjuvant radiation completed 10/21/2014 1) Left Breast / 50.4 Gy in 28 fractions 2) Left supraclavicular / 50.4 Gy in 28 fractions 3) Left Posterior axillary boost  / 9.52 Gy in 28 fractions 4) Left Breast Boost / 10 Gy in 5 fractions  (4) anastrozole started 01/19/2015-[name] informed me she never started this.   METASTATIC DISEASE to skin: September 2022 (5) mammography 05/24/2021 shows fluid collection in the right axilla, no obvious malignancy within each breast but superficial skin lesions throughout the upper outer left breast.  (A) CT scan of the abdomen and pelvis with contrast 06/10/2021 shows small low-attenuation liver lesions   which could be cysts but no definite evidence of metastatic disease.  (B) chest CT scan 07/14/2021 shows no evidence of metastatic  disease  (C) CT head with and without contrast on 07/15/2021 shows no evidence of metastatic disease (D) biopsy left breast skin lesion 07/25/2021 shows metastatic carcinoma, estrogen and progesterone receptor positive, HER2 not amplified (1+)  (E) excision right axillary mass 07/25/2021 shows benign epidermoid cyst (E) CA 27-29 on 07/05/2021 is normal at 21.1  (6) iron deficiency anemia: On 07/05/2021 the ferritin was less than 4 and the iron saturation 3%; hemoglobin was 9.1 with an MCV of 71.3  (A) Venofer started on 07/18/2021, to be repeated x5   (B) GI evaluation 08/11/2021  (7) to start fulvestrant 09/13/2021  (A) to start abemaciclib 50 mg twice daily starting 09/13/2021  (8) status post left mastectomy 08/28/2021 for a pT3 pNX invasive lobular carcinoma, grade 2, with negative margins.   Recurrent breast cancer, left ([place])  04/14/2014 Initial Diagnosis   Recurrent breast cancer, left ([facility])     CURRENT THERAPY: Fulvestrant   INTERVAL HISTORY:  Kimberly [name] 51 y.o. female returns for evaluation while on Verzenio and anastrozole.  She is tolerating the current dose of Verzenio well 50 mg p.o. twice daily however she has been taking nausea medication scheduled every day twice a day.   She no-show to her last appointment and recently called and expressed some concern with new bumps showing up again on her chest and hence has been scheduled for follow-up today. Today, she complains of some bumps on her skin of the left chest wall and she got stressed because her last cancer presented this way.  She also complains of excruciating pain, redness of the skin on that side and subjective fevers in the area. She also notices excruciating tenderness along the surgical scar and in the left axilla.  Rest of the pertinent 10 point ROS reviewed and negative  Patient Active Problem List   Diagnosis Date Noted   Chronic pain of right knee 03/08/2022   S/P mastectomy, left 08/28/2021    Goals of care, counseling/discussion 07/18/2021   Microcytic anemia 07/05/2021   Left corneal abrasion 06/30/2021   History of COVID-19 01/27/2021   Elevated LDL cholesterol level 12/30/2020   COPD exacerbation ([place]) 08/24/2020   Oligouria 03/10/2020   History of breast cancer 02/11/2020   Total body pain 01/13/2019   Postmenopausal bleeding 10/25/2017   Chronic pain of both knees 05/03/2017   COPD (chronic obstructive pulmonary disease) ([place]) 03/20/2017   Tachycardia    Mixed incontinence 09/29/2015   Genetic testing 08/26/2015   Recurrent breast cancer, left ([place]) 04/14/2014   Disorder of bladder 09/29/2008   LOW BACK PAIN, CHRONIC 09/29/2008   Morbid (severe) obesity due to excess calories ([place]) 11/19/2007   ANEMIA, IRON DEFICIENCY, CHRONIC 11/14/2007   Mixed bipolar I disorder ([place]) 02/20/2007   Tobacco use disorder 02/20/2007    has No Known Allergies.  MEDICAL HISTORY: Past Medical History:  Diagnosis Date   Acute cholecystitis 04/17/2019   Anemia    Anxiety    Arthritis    Bipolar 1 disorder ([place])    Blood transfusion without reported diagnosis 2007   post bleeding childbirth   Cancer ([place])    left breast cancer 2015   COPD (chronic obstructive pulmonary disease) ([place])    pt reports she is not on oxygen   COVID-19 virus infection 06/07/2019   Depression    Diabetes mellitus without complication ([place])  gestational   Hand injury, right, initial encounter 03/19/2019   Heart murmur    as a child-not adult-no cardiac work up   History of radiation therapy 08/31/14-10/21/14   left breast/ left supraclavicular 50.4 Gy 28 fx, lef tposterior axillary boost 9.52 Gy 28 fx, left rbeast boost/ 10 Gy 5 fx   Personal history of chemotherapy    Personal history of radiation therapy    Rash and nonspecific skin eruption 12/27/2020    SURGICAL HISTORY: Past Surgical History:  Procedure Laterality Date   BREAST BIOPSY Left 04/01/14   BREAST BIOPSY Right 04/19/14    BREAST LUMPECTOMY WITH AXILLARY LYMPH NODE DISSECTION Bilateral 05/03/14   CHOLECYSTECTOMY N/A 04/18/2019   Procedure: LAPAROSCOPIC CHOLECYSTECTOMY WITH INTRAOPERATIVE CHOLANGIOGRAM;  Surgeon: Jovita Kussmaul, MD;  Location: [facility];  Service: General;  Laterality: N/A;   DILATION AND CURETTAGE OF UTERUS     after childbirth   MASS EXCISION Right 07/25/2021   Procedure: RIGHT AXILLARY MASS EXCISION;  Surgeon: [name], MD;  Location: [facility];  Service: General;  Laterality: Right;   MINOR BREAST BIOPSY Left 07/25/2021   Procedure: SKIN PUNCH BIOPSY LEFT BREAST;  Surgeon: [name], MD;  Location: [facility];  Service: General;  Laterality: Left;   MULTIPLE TOOTH EXTRACTIONS     only 5 left   PORTACATH PLACEMENT Right 06/18/2014   Procedure: INSERTION PORT-A-CATH;  Surgeon: [name], MD;  Location: [facility];  Service: General;  Laterality: Right;   TONSILLECTOMY AND ADENOIDECTOMY     TOTAL MASTECTOMY Left 08/28/2021   Procedure: LEFT TOTAL MASTECTOMY;  Surgeon: [name], MD;  Location: [facility];  Service: General;  Laterality: Left;  90 MINUTES- POST PER KELLY AND SHE WILL PUT IN ROOM    SOCIAL HISTORY: Social History   Socioeconomic History   Marital status: Widowed    Spouse name: Not on file   Number of children: 5   Years of education: 12   Highest education level: High school graduate  Occupational History   Occupation: disabled  Tobacco Use   Smoking status: Every Day    Packs/day: 0.50    Years: 34.00    Total pack years: 17.00    Types: Cigarettes   Smokeless tobacco: Never  Vaping Use   Vaping Use: Never used  Substance and Sexual Activity   Alcohol use: No   Drug use: No   Sexual activity: Not Currently  Other Topics Concern   Not on file  Social History Narrative   Patient lives in [place] with 3 of her children.    One adult son lives "up the road" and one lives in [place].   Patient is on disability.    Patient  enjoys spending time with her family and watching TV.    Patient walks around her home for exercise.    Social Determinants of Health   Financial Resource Strain: Medium Risk (11/08/2021)   Overall Financial Resource Strain (CARDIA)    Difficulty of Paying Living Expenses: Somewhat hard  Food Insecurity: Food Insecurity Present (11/08/2021)   Hunger Vital Sign    Worried About Running Out of Food in the Last Year: Sometimes true    Ran Out of Food in the Last Year: Never true  Transportation Needs: No Transportation Needs (10/10/2021)   PRAPARE - Hydrologist (Medical): No    Lack of Transportation (Non-Medical): No  Physical Activity: Inactive (10/10/2021)   Exercise Vital Sign  Days of Exercise per Week: 0 days    Minutes of Exercise per Session: 0 min  Stress: No Stress Concern Present (10/10/2021)   [place]    Feeling of Stress : Not at all  Social Connections: Moderately Integrated (10/10/2021)   Social Connection and Isolation Panel [NHANES]    Frequency of Communication with Friends and Family: More than three times a week    Frequency of Social Gatherings with Friends and Family: More than three times a week    Attends Religious Services: More than 4 times per year    Active Member of [employer] or Organizations: Yes    Attends Archivist Meetings: More than 4 times per year    Marital Status: Widowed  Intimate Partner Violence: Not At Risk (10/10/2021)   Humiliation, Afraid, Rape, and Kick questionnaire    Fear of Current or Ex-Partner: No    Emotionally Abused: No    Physically Abused: No    Sexually Abused: No    FAMILY HISTORY: Family History  Problem Relation Age of Onset   Heart disease Father    Cancer Father        Prostate   Cancer Maternal Grandmother    Depression Maternal Grandfather    Cancer Paternal Grandmother    Stomach cancer Neg Hx     Esophageal cancer Neg Hx    Rectal cancer Neg Hx     Review of Systems  Constitutional:  Positive for fatigue. Negative for appetite change, chills, fever and unexpected weight change.  HENT:   Negative for hearing loss, lump/mass and trouble swallowing.   Eyes:  Negative for eye problems and icterus.  Respiratory:  Negative for chest tightness, cough and shortness of breath.   Cardiovascular:  Negative for chest pain, leg swelling and palpitations.  Gastrointestinal:  Negative for abdominal distention, abdominal pain, constipation, diarrhea, nausea and vomiting.  Endocrine: Negative for hot flashes.  Genitourinary:  Negative for difficulty urinating.   Musculoskeletal:  Negative for arthralgias.  Skin:  Positive for rash. Negative for itching.  Neurological:  Negative for dizziness, extremity weakness, headaches and numbness.  Hematological:  Negative for adenopathy. Does not bruise/bleed easily.  Psychiatric/Behavioral:  Negative for depression. The patient is not nervous/anxious.       PHYSICAL EXAMINATION  ECOG PERFORMANCE STATUS: 1 - Symptomatic but completely ambulatory  There were no vitals filed for this visit.   Physical Exam Constitutional:      General: She is not in acute distress.    Appearance: Normal appearance. She is not toxic-appearing.  HENT:     Head: Normocephalic and atraumatic.  Eyes:     General: No scleral icterus. Cardiovascular:     Rate and Rhythm: Normal rate and regular rhythm.     Pulses: Normal pulses.     Heart sounds: Normal heart sounds.  Pulmonary:     Effort: Pulmonary effort is normal.     Breath sounds: Normal breath sounds.  Chest:     Comments: Patch of redness noted in the left chest wall upper inner quadrant with a small scab.  No palpable nodules but again the exam is very limited because she complains of excruciating tenderness on palpation.  She also appears to have had some random scabs on other areas which appears to be  secondary to bites and itching. Abdominal:     General: Abdomen is flat. Bowel sounds are normal. There is no distension.  Palpations: Abdomen is soft.     Tenderness: There is no abdominal tenderness.  Musculoskeletal:        General: No swelling.     Cervical back: Neck supple.  Lymphadenopathy:     Cervical: No cervical adenopathy.  Skin:    General: Skin is warm and dry.     Findings: No rash.  Neurological:     General: No focal deficit present.     Mental Status: She is alert.  Psychiatric:        Mood and Affect: Mood normal.        Behavior: Behavior normal.     LABORATORY DATA:  CBC    Component Value Date/Time   WBC 7.5 04/02/2022 1359   WBC 12.7 (H) 09/17/2021 0554   RBC 4.36 04/02/2022 1359   HGB 11.0 (L) 04/02/2022 1359   HGB 11.1 03/07/2022 1443   HGB 14.4 01/19/2015 1421   HCT 34.4 (L) 04/02/2022 1359   HCT 34.7 03/07/2022 1443   HCT 44.9 01/19/2015 1421   PLT 264 04/02/2022 1359   PLT 266 03/07/2022 1443   MCV 78.9 (L) 04/02/2022 1359   MCV 80 03/07/2022 1443   MCV 79.9 01/19/2015 1421   MCH 25.2 (L) 04/02/2022 1359   MCHC 32.0 04/02/2022 1359   RDW 18.4 (H) 04/02/2022 1359   RDW 19.6 (H) 03/07/2022 1443   RDW 17.3 (H) 01/19/2015 1421   LYMPHSABS 2.3 04/02/2022 1359   LYMPHSABS 1.3 01/19/2015 1421   MONOABS 0.7 04/02/2022 1359   MONOABS 0.6 01/19/2015 1421   EOSABS 0.0 04/02/2022 1359   EOSABS 0.0 01/19/2015 1421   BASOSABS 0.0 04/02/2022 1359   BASOSABS 0.0 01/19/2015 1421    CMP     Component Value Date/Time   NA 137 04/02/2022 1359   NA 138 03/10/2020 1534   NA 139 01/19/2015 1422   K 3.9 04/02/2022 1359   K 3.8 01/19/2015 1422   CL 104 04/02/2022 1359   CO2 28 04/02/2022 1359   CO2 23 01/19/2015 1422   GLUCOSE 107 (H) 04/02/2022 1359   GLUCOSE 93 01/19/2015 1422   BUN 6 04/02/2022 1359   BUN 8 03/10/2020 1534   BUN 8.6 01/19/2015 1422   CREATININE 1.30 (H) 04/02/2022 1359   CREATININE 1.1 01/19/2015 1422   CALCIUM 10.0  04/02/2022 1359   CALCIUM 9.3 01/19/2015 1422   PROT 7.8 04/02/2022 1359   PROT 7.2 02/11/2020 1626   PROT 7.3 01/19/2015 1422   ALBUMIN 4.4 04/02/2022 1359   ALBUMIN 4.4 02/11/2020 1626   ALBUMIN 3.9 01/19/2015 1422   AST 10 (L) 04/02/2022 1359   AST 14 01/19/2015 1422   ALT 6 04/02/2022 1359   ALT 13 01/19/2015 1422   ALKPHOS 68 04/02/2022 1359   ALKPHOS 82 01/19/2015 1422   BILITOT 0.3 04/02/2022 1359   BILITOT 0.35 01/19/2015 1422   GFRNONAA 50 (L) 04/02/2022 1359   GFRAA 76 03/10/2020 1534    ASSESSMENT and THERAPY PLAN:   This is a 51 year old female patient, BRCA negative status post left lumpectomy and sentinel biopsy for a T1CN0 stage Ia IDC back in July 2015, ER 83% positive, PR 66% positive and MIB of 14%, no HER2 amplification had adjuvant TC followed by radiation, did not take antiestrogen therapy who is now here with recurrent breast cancer status post left mastectomy and adjuvant radiation.  She is currently on abemaciclib and fulvestrant for adjuvant therapy given high risk for recurrence.   She missed her last appointment  in June and recently called with some new bumps on her chest wall hence rescheduled for follow-up. She is also due for mammogram of the right breast. PE today with redness and erythema of chest wall, some scabs on the left chest.  Physical exam is very limited today because of excruciating pain Please see the picture in media for today's findings.  We have discussed about a trial of doxycycline and return to clinic in 2 weeks for follow-up.  If she does not have any relief of the erythema or tenderness in the next 2 weeks, we will consider ultrasound of the chest wall and further evaluation. She was recommended to hold Verzenio for the next 2 weeks.  She can continue anastrozole. Return to clinic in 2 weeks  Thank you for consulting [name] the care of this patient.  Please not hesitate contact us with any additional questions or concerns  No orders of  the defined types were placed in this encounter.   All questions were answered. The patient knows to call the clinic with any problems, questions or concerns. We can certainly see the patient much sooner if necessary.  Total encounter time: 30 minutes in face-to-face visit time, chart review, lab review, order entry, care coordination, and documentation of the encounter.  *Total Encounter Time as defined by the Centers for Medicare and Medicaid Services includes, in addition to the face-to-face time of a patient visit (documented in the note above) non-face-to-face time: obtaining and reviewing outside history, ordering and reviewing medications, tests or procedures, care coordination (communications with other health care professionals or caregivers) and documentation in the medical record.

## 2022-05-02 ENCOUNTER — Telehealth: Payer: Self-pay | Admitting: Hematology and Oncology

## 2022-05-02 NOTE — Telephone Encounter (Signed)
Scheduled appointment per 07/11 los. Patient aware.

## 2022-05-07 ENCOUNTER — Ambulatory Visit: Payer: Medicare Other | Admitting: Family Medicine

## 2022-05-14 ENCOUNTER — Inpatient Hospital Stay: Payer: Medicare Other

## 2022-05-14 ENCOUNTER — Inpatient Hospital Stay: Payer: Medicare Other | Admitting: Hematology and Oncology

## 2022-05-29 ENCOUNTER — Inpatient Hospital Stay: Payer: Medicare Other

## 2022-05-29 ENCOUNTER — Inpatient Hospital Stay: Payer: Medicare Other | Admitting: Hematology and Oncology

## 2022-05-29 NOTE — Progress Notes (Deleted)
[name] Cancer Follow up:    [name], [name], MD [address] [place] [address]   DIAGNOSIS:  Cancer Staging  No matching staging information was found for the patient.  SUMMARY OF ONCOLOGIC HISTORY: Oncology History Overview Note  The patient had bilateral screening mammography with tomography 03/19/2014. This was the patient's first ever mammography. Showed a possible mass in the left breast. Left diagnostic mammography and ultrasonography 04/01/2014 showed an irregular mass in the upper outer quadrant of the left breast with a possible satellite 1 cm anterior to it. On physical exam there was a firm palpable mass at the 1:00 position of the left breast 8 cm from the nipple. There was no palpable left axillary adenopathy. Ultrasound showed an irregular hypoechoic mass in the area in question measuring 1.4 cm. There was a 4 mm nodule located anterior to this. Ultrasound of the left axilla was benign.  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 The second mass was negative for malignancy. This was felt to be concordant.  On 04/08/2014 the patient underwent bilateral breast MRI. This showed a 9 mm enhancing mass in the subareolar area of the right breast in in the left breast, the previously noted mass measuring 1.3 cm. There were no morphologically abnormal lymph nodes  The right breast finding was followed up on 04/19/2014 with ultrasound which showed an intraductal soft tissue mass in the inferior subareolar worsening of the right breast measuring 8 mm. This was biopsied 04/19/2014 and showed ([id number]) ectatic duct, with no evidence of malignancy. Surgical excision  was recommended.  Accordingly on 05/03/2014 the patient underwent right lumpectomy, showing an intraductal papilloma, with known malignancy identified. Left lumpectomy on the same day showed an invasive ductal carcinoma measuring 1.5 cm, with one of the 2 sentinel lymph nodes positive for carcinoma. There was no extracapsular extension. Margins were clear and ample. HER-2 was repeated and was again negative.  The patient's case was discussed in the multidisciplinary breast cancer conference 05/12/2014. An Oncotype had been previously requested and this showed a recurrent score of 22, in the intermediate range. The patient qualifies for the S1007 study and this will be discussed with her. Otherwise the standard recommendation would be chemotherapy followed by radiation followed by anti-estrogens. Oncotype score of 22 predicts a risk of outside the breast recurrence within 10 years of 13% if the patient's only systemic therapy is tamoxifen for 5 years.  (2) adjuvant chemotherapy with cyclophosphamide and docetaxel started 07/06/2014, patient tolerated chemotherapy very poorly and it was discontinued after one cycle.    (3) adjuvant radiation completed 10/21/2014 1) Left Breast / 50.4 Gy in 28 fractions 2) Left supraclavicular / 50.4 Gy in 28 fractions 3) Left Posterior axillary boost  / 9.52 Gy in 28 fractions 4) Left Breast Boost / 10 Gy in 5 fractions  (4) anastrozole started 01/19/2015-[name] informed me she never started this.   METASTATIC DISEASE to skin: September 2022 (5) mammography 05/24/2021 shows fluid collection in the right axilla, no obvious malignancy within each breast but superficial skin lesions throughout the upper outer left breast.  (A) CT scan of the abdomen and pelvis with contrast 06/10/2021 shows small low-attenuation liver lesions   which could be cysts but no definite evidence of metastatic disease.  (B) chest CT scan 07/14/2021 shows no evidence of metastatic  disease  (C) CT head with and without contrast on 07/15/2021 shows no evidence of metastatic disease (D) biopsy left breast skin lesion 07/25/2021 shows metastatic carcinoma, estrogen and progesterone receptor positive, HER2 not amplified (1+)  (E) excision right axillary mass 07/25/2021 shows benign epidermoid cyst (E) CA 27-29 on 07/05/2021 is normal at 21.1  (6) iron deficiency anemia: On 07/05/2021 the ferritin was less than 4 and the iron saturation 3%; hemoglobin was 9.1 with an MCV of 71.3  (A) Venofer started on 07/18/2021, to be repeated x5   (B) GI evaluation 08/11/2021  (7) to start fulvestrant 09/13/2021  (A) to start abemaciclib 50 mg twice daily starting 09/13/2021  (8) status post left mastectomy 08/28/2021 for a pT3 pNX invasive lobular carcinoma, grade 2, with negative margins.   Recurrent breast cancer, left ([place])  04/14/2014 Initial Diagnosis   Recurrent breast cancer, left ([facility])     CURRENT THERAPY: Fulvestrant   INTERVAL HISTORY:  Kimberly [name] 51 y.o. female returns for evaluation while on Verzenio and anastrozole.  She is tolerating the current dose of Verzenio well 50 mg p.o. twice daily however she has been taking nausea medication scheduled every day twice a day.    Rest of the pertinent 10 point ROS reviewed and negative  Patient Active Problem List   Diagnosis Date Noted   Chronic pain of right knee 03/08/2022   S/P mastectomy, left 08/28/2021   Goals of care, counseling/discussion 07/18/2021   Microcytic anemia 07/05/2021   Left corneal abrasion 06/30/2021   History of COVID-19 01/27/2021   Elevated LDL cholesterol level 12/30/2020   COPD exacerbation ([place]) 08/24/2020   Oligouria 03/10/2020   History of breast cancer 02/11/2020   Total body pain 01/13/2019   Postmenopausal bleeding 10/25/2017   Chronic pain of both knees 05/03/2017   COPD (chronic obstructive pulmonary disease) ([place]) 03/20/2017   Tachycardia    Mixed incontinence 09/29/2015    Genetic testing 08/26/2015   Recurrent breast cancer, left ([place]) 04/14/2014   Disorder of bladder 09/29/2008   LOW BACK PAIN, CHRONIC 09/29/2008   Morbid (severe) obesity due to excess calories ([place]) 11/19/2007   ANEMIA, IRON DEFICIENCY, CHRONIC 11/14/2007   Mixed bipolar I disorder ([place]) 02/20/2007   Tobacco use disorder 02/20/2007    has No Known Allergies.  MEDICAL HISTORY: Past Medical History:  Diagnosis Date   Acute cholecystitis 04/17/2019   Anemia    Anxiety    Arthritis    Bipolar 1 disorder ([place])    Blood transfusion without reported diagnosis 2007   post bleeding childbirth   Cancer ([place])    left breast cancer 2015   COPD (chronic obstructive pulmonary disease) ([place])    pt reports she is not on oxygen   COVID-19 virus infection 06/07/2019   Depression    Diabetes mellitus without complication ([place])    gestational   Hand injury, right, initial encounter 03/19/2019   Heart murmur    as a child-not adult-no cardiac work up   History of radiation therapy 08/31/14-10/21/14   left breast/ left supraclavicular 50.4 Gy 28 fx, lef tposterior axillary boost 9.52 Gy 28 fx, left rbeast boost/ 10 Gy 5 fx   Personal history of chemotherapy    Personal history of radiation therapy    Rash and nonspecific skin eruption 12/27/2020    SURGICAL HISTORY: Past Surgical History:  Procedure Laterality  Date   BREAST BIOPSY Left 04/01/14   BREAST BIOPSY Right 04/19/14   BREAST LUMPECTOMY WITH AXILLARY LYMPH NODE DISSECTION Bilateral 05/03/14   CHOLECYSTECTOMY N/A 04/18/2019   Procedure: LAPAROSCOPIC CHOLECYSTECTOMY WITH INTRAOPERATIVE CHOLANGIOGRAM;  Surgeon: Jovita Kussmaul, MD;  Location: [facility];  Service: General;  Laterality: N/A;   DILATION AND CURETTAGE OF UTERUS     after childbirth   MASS EXCISION Right 07/25/2021   Procedure: RIGHT AXILLARY MASS EXCISION;  Surgeon: [name], MD;  Location: [facility];  Service: General;  Laterality: Right;   MINOR BREAST BIOPSY Left  07/25/2021   Procedure: SKIN PUNCH BIOPSY LEFT BREAST;  Surgeon: [name], MD;  Location: [facility];  Service: General;  Laterality: Left;   MULTIPLE TOOTH EXTRACTIONS     only 5 left   PORTACATH PLACEMENT Right 06/18/2014   Procedure: INSERTION PORT-A-CATH;  Surgeon: [name], MD;  Location: [facility];  Service: General;  Laterality: Right;   TONSILLECTOMY AND ADENOIDECTOMY     TOTAL MASTECTOMY Left 08/28/2021   Procedure: LEFT TOTAL MASTECTOMY;  Surgeon: [name], MD;  Location: [facility];  Service: General;  Laterality: Left;  90 MINUTES- POST PER KELLY AND SHE WILL PUT IN ROOM    SOCIAL HISTORY: Social History   Socioeconomic History   Marital status: Widowed    Spouse name: Not on file   Number of children: 5   Years of education: 12   Highest education level: High school graduate  Occupational History   Occupation: disabled  Tobacco Use   Smoking status: Every Day    Packs/day: 0.50    Years: 34.00    Total pack years: 17.00    Types: Cigarettes   Smokeless tobacco: Never  Vaping Use   Vaping Use: Never used  Substance and Sexual Activity   Alcohol use: No   Drug use: No   Sexual activity: Not Currently  Other Topics Concern   Not on file  Social History Narrative   Patient lives in [place] with 3 of her children.    One adult son lives "up the road" and one lives in [place].   Patient is on disability.    Patient enjoys spending time with her family and watching TV.    Patient walks around her home for exercise.    Social Determinants of Health   Financial Resource Strain: Medium Risk (11/08/2021)   Overall Financial Resource Strain (CARDIA)    Difficulty of Paying Living Expenses: Somewhat hard  Food Insecurity: Food Insecurity Present (11/08/2021)   Hunger Vital Sign    Worried About Running Out of Food in the Last Year: Sometimes true    Ran Out of Food in the Last Year: Never true  Transportation Needs: No  Transportation Needs (10/10/2021)   PRAPARE - Hydrologist (Medical): No    Lack of Transportation (Non-Medical): No  Physical Activity: Inactive (10/10/2021)   Exercise Vital Sign    Days of Exercise per Week: 0 days    Minutes of Exercise per Session: 0 min  Stress: No Stress Concern Present (10/10/2021)   [place]    Feeling of Stress : Not at all  Social Connections: Moderately Integrated (10/10/2021)   Social Connection and Isolation Panel [NHANES]    Frequency of Communication with Friends and Family: More than three times a week    Frequency of Social Gatherings with Friends and Family: More than three  times a week    Attends Religious Services: More than 4 times per year    Active Member of Clubs or Organizations: Yes    Attends Archivist Meetings: More than 4 times per year    Marital Status: Widowed  Intimate Partner Violence: Not At Risk (10/10/2021)   Humiliation, Afraid, Rape, and Kick questionnaire    Fear of Current or Ex-Partner: No    Emotionally Abused: No    Physically Abused: No    Sexually Abused: No    FAMILY HISTORY: Family History  Problem Relation Age of Onset   Heart disease Father    Cancer Father        Prostate   Cancer Maternal Grandmother    Depression Maternal Grandfather    Cancer Paternal Grandmother    Stomach cancer Neg Hx    Esophageal cancer Neg Hx    Rectal cancer Neg Hx     Review of Systems  Constitutional:  Positive for fatigue. Negative for appetite change, chills, fever and unexpected weight change.  HENT:   Negative for hearing loss, lump/mass and trouble swallowing.   Eyes:  Negative for eye problems and icterus.  Respiratory:  Negative for chest tightness, cough and shortness of breath.   Cardiovascular:  Negative for chest pain, leg swelling and palpitations.  Gastrointestinal:  Negative for abdominal distention,  abdominal pain, constipation, diarrhea, nausea and vomiting.  Endocrine: Negative for hot flashes.  Genitourinary:  Negative for difficulty urinating.   Musculoskeletal:  Negative for arthralgias.  Skin:  Positive for rash. Negative for itching.  Neurological:  Negative for dizziness, extremity weakness, headaches and numbness.  Hematological:  Negative for adenopathy. Does not bruise/bleed easily.  Psychiatric/Behavioral:  Negative for depression. The patient is not nervous/anxious.       PHYSICAL EXAMINATION  ECOG PERFORMANCE STATUS: 1 - Symptomatic but completely ambulatory  There were no vitals filed for this visit.   Physical Exam Constitutional:      General: She is not in acute distress.    Appearance: Normal appearance. She is not toxic-appearing.  HENT:     Head: Normocephalic and atraumatic.  Eyes:     General: No scleral icterus. Cardiovascular:     Rate and Rhythm: Normal rate and regular rhythm.     Pulses: Normal pulses.     Heart sounds: Normal heart sounds.  Pulmonary:     Effort: Pulmonary effort is normal.     Breath sounds: Normal breath sounds.  Chest:     Comments: Patch of redness noted in the left chest wall upper inner quadrant with a small scab.  No palpable nodules but again the exam is very limited because she complains of excruciating tenderness on palpation.  She also appears to have had some random scabs on other areas which appears to be secondary to bites and itching. Abdominal:     General: Abdomen is flat. Bowel sounds are normal. There is no distension.     Palpations: Abdomen is soft.     Tenderness: There is no abdominal tenderness.  Musculoskeletal:        General: No swelling.     Cervical back: Neck supple.  Lymphadenopathy:     Cervical: No cervical adenopathy.  Skin:    General: Skin is warm and dry.     Findings: No rash.  Neurological:     General: No focal deficit present.     Mental Status: She is alert.  Psychiatric:  Mood and Affect: Mood normal.        Behavior: Behavior normal.     LABORATORY DATA:  CBC    Component Value Date/Time   WBC 7.2 05/01/2022 1430   WBC 12.7 (H) 09/17/2021 0554   RBC 4.45 05/01/2022 1430   HGB 10.8 (L) 05/01/2022 1430   HGB 11.1 03/07/2022 1443   HGB 14.4 01/19/2015 1421   HCT 33.8 (L) 05/01/2022 1430   HCT 34.7 03/07/2022 1443   HCT 44.9 01/19/2015 1421   PLT 281 05/01/2022 1430   PLT 266 03/07/2022 1443   MCV 76.0 (L) 05/01/2022 1430   MCV 80 03/07/2022 1443   MCV 79.9 01/19/2015 1421   MCH 24.3 (L) 05/01/2022 1430   MCHC 32.0 05/01/2022 1430   RDW 18.8 (H) 05/01/2022 1430   RDW 19.6 (H) 03/07/2022 1443   RDW 17.3 (H) 01/19/2015 1421   LYMPHSABS 2.3 05/01/2022 1430   LYMPHSABS 1.3 01/19/2015 1421   MONOABS 0.6 05/01/2022 1430   MONOABS 0.6 01/19/2015 1421   EOSABS 0.2 05/01/2022 1430   EOSABS 0.0 01/19/2015 1421   BASOSABS 0.0 05/01/2022 1430   BASOSABS 0.0 01/19/2015 1421    CMP     Component Value Date/Time   NA 136 05/01/2022 1430   NA 138 03/10/2020 1534   NA 139 01/19/2015 1422   K 3.5 05/01/2022 1430   K 3.8 01/19/2015 1422   CL 102 05/01/2022 1430   CO2 26 05/01/2022 1430   CO2 23 01/19/2015 1422   GLUCOSE 94 05/01/2022 1430   GLUCOSE 93 01/19/2015 1422   BUN 7 05/01/2022 1430   BUN 8 03/10/2020 1534   BUN 8.6 01/19/2015 1422   CREATININE 1.35 (H) 05/01/2022 1430   CREATININE 1.1 01/19/2015 1422   CALCIUM 9.9 05/01/2022 1430   CALCIUM 9.3 01/19/2015 1422   PROT 8.3 (H) 05/01/2022 1430   PROT 7.2 02/11/2020 1626   PROT 7.3 01/19/2015 1422   ALBUMIN 4.5 05/01/2022 1430   ALBUMIN 4.4 02/11/2020 1626   ALBUMIN 3.9 01/19/2015 1422   AST 11 (L) 05/01/2022 1430   AST 14 01/19/2015 1422   ALT 5 05/01/2022 1430   ALT 13 01/19/2015 1422   ALKPHOS 64 05/01/2022 1430   ALKPHOS 82 01/19/2015 1422   BILITOT 0.5 05/01/2022 1430   BILITOT 0.35 01/19/2015 1422   GFRNONAA 48 (L) 05/01/2022 1430   GFRAA 76 03/10/2020 1534     ASSESSMENT and THERAPY PLAN:   This is a 51 year old female patient, BRCA negative status post left lumpectomy and sentinel biopsy for a T1CN0 stage Ia IDC back in July 2015, ER 83% positive, PR 66% positive and MIB of 14%, no HER2 amplification had adjuvant TC followed by radiation, did not take antiestrogen therapy who is now here with recurrent breast cancer status post left mastectomy and adjuvant radiation.  She is currently on abemaciclib and fulvestrant for adjuvant therapy given high risk for recurrence.   Since her last visit, she has taken doxycycline for some redness of the left chest wall.   Thank you for consulting [name] the care of this patient.  Please not hesitate contact us with any additional questions or concerns  No orders of the defined types were placed in this encounter.   All questions were answered. The patient knows to call the clinic with any problems, questions or concerns. We can certainly see the patient much sooner if necessary.  Total encounter time: 30 minutes in face-to-face visit time, chart review, lab review, order  entry, care coordination, and documentation of the encounter.  *Total Encounter Time as defined by the Centers for Medicare and Medicaid Services includes, in addition to the face-to-face time of a patient visit (documented in the note above) non-face-to-face time: obtaining and reviewing outside history, ordering and reviewing medications, tests or procedures, care coordination (communications with other health care professionals or caregivers) and documentation in the medical record.

## 2022-06-06 ENCOUNTER — Other Ambulatory Visit: Payer: Self-pay

## 2022-06-06 ENCOUNTER — Inpatient Hospital Stay: Payer: Medicare Other | Attending: Oncology

## 2022-06-06 DIAGNOSIS — C50919 Malignant neoplasm of unspecified site of unspecified female breast: Secondary | ICD-10-CM

## 2022-06-06 MED ORDER — HEPARIN SOD (PORK) LOCK FLUSH 100 UNIT/ML IV SOLN: 500.0000 [IU] | Freq: Once | INTRAVENOUS | Status: DC

## 2022-06-06 MED ORDER — SODIUM CHLORIDE 0.9% FLUSH: 10.0000 mL | Freq: Once | INTRAVENOUS | Status: DC

## 2022-06-06 NOTE — Progress Notes (Signed)
Patient thought that she had an injection appointment today. She was informed that she was scheduled for a port flush appointment today, not an injection. Patient declined to have port flushed. She would like to stop being scheduled for port flushes in the future.

## 2022-06-19 ENCOUNTER — Inpatient Hospital Stay: Payer: Medicare Other

## 2022-06-19 ENCOUNTER — Inpatient Hospital Stay: Payer: Medicare Other | Admitting: Hematology and Oncology

## 2022-06-19 NOTE — Progress Notes (Deleted)
[facility] Cancer Follow up:    [name], [name], MD [address] [place] [address]   DIAGNOSIS:  Cancer Staging  No matching staging information was found for the patient.  SUMMARY OF ONCOLOGIC HISTORY: Oncology History Overview Note  The patient had bilateral screening mammography with tomography 03/19/2014. This was the patient's first ever mammography. Showed a possible mass in the left breast. Left diagnostic mammography and ultrasonography 04/01/2014 showed an irregular mass in the upper outer quadrant of the left breast with a possible satellite 1 cm anterior to it. On physical exam there was a firm palpable mass at the 1:00 position of the left breast 8 cm from the nipple. There was no palpable left axillary adenopathy. Ultrasound showed an irregular hypoechoic mass in the area in question measuring 1.4 cm. There was a 4 mm nodule located anterior to this. Ultrasound of the left axilla was benign.  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 The second mass was negative for malignancy. This was felt to be concordant.  On 04/08/2014 the patient underwent bilateral breast MRI. This showed a 9 mm enhancing mass in the subareolar area of the right breast in in the left breast, the previously noted mass measuring 1.3 cm. There were no morphologically abnormal lymph nodes  The right breast finding was followed up on 04/19/2014 with ultrasound which showed an intraductal soft tissue mass in the inferior subareolar worsening of the right breast measuring 8 mm. This was biopsied 04/19/2014 and showed ([id number]) ectatic duct, with no evidence of malignancy. Surgical excision  was recommended.  Accordingly on 05/03/2014 the patient underwent right lumpectomy, showing an intraductal papilloma, with known malignancy identified. Left lumpectomy on the same day showed an invasive ductal carcinoma measuring 1.5 cm, with one of the 2 sentinel lymph nodes positive for carcinoma. There was no extracapsular extension. Margins were clear and ample. HER-2 was repeated and was again negative.  The patient's case was discussed in the multidisciplinary breast cancer conference 05/12/2014. An Oncotype had been previously requested and this showed a recurrent score of 22, in the intermediate range. The patient qualifies for the S1007 study and this will be discussed with her. Otherwise the standard recommendation would be chemotherapy followed by radiation followed by anti-estrogens. Oncotype score of 22 predicts a risk of outside the breast recurrence within 10 years of 13% if the patient's only systemic therapy is tamoxifen for 5 years.  (2) adjuvant chemotherapy with cyclophosphamide and docetaxel started 07/06/2014, patient tolerated chemotherapy very poorly and it was discontinued after one cycle.    (3) adjuvant radiation completed 10/21/2014 1) Left Breast / 50.4 Gy in 28 fractions 2) Left supraclavicular / 50.4 Gy in 28 fractions 3) Left Posterior axillary boost  / 9.52 Gy in 28 fractions 4) Left Breast Boost / 10 Gy in 5 fractions  (4) anastrozole started 01/19/2015-Kimberly [name] informed me she never started this.   METASTATIC DISEASE to skin: September 2022 (5) mammography 05/24/2021 shows fluid collection in the right axilla, no obvious malignancy within each breast but superficial skin lesions throughout the upper outer left breast.  (A) CT scan of the abdomen and pelvis with contrast 06/10/2021 shows small low-attenuation liver lesions   which could be cysts but no definite evidence of metastatic disease.  (B) chest CT scan 07/14/2021 shows no evidence of metastatic  disease  (C) CT head with and without contrast on 07/15/2021 shows no evidence of metastatic disease (D) biopsy left breast skin lesion 07/25/2021 shows metastatic carcinoma, estrogen and progesterone receptor positive, HER2 not amplified (1+)  (E) excision right axillary mass 07/25/2021 shows benign epidermoid cyst (E) CA 27-29 on 07/05/2021 is normal at 21.1  (6) iron deficiency anemia: On 07/05/2021 the ferritin was less than 4 and the iron saturation 3%; hemoglobin was 9.1 with an MCV of 71.3  (A) Venofer started on 07/18/2021, to be repeated x5   (B) GI evaluation 08/11/2021  (7) to start fulvestrant 09/13/2021  (A) to start abemaciclib 50 mg twice daily starting 09/13/2021  (8) status post left mastectomy 08/28/2021 for a pT3 pNX invasive lobular carcinoma, grade 2, with negative margins.   Recurrent breast cancer, left (Kimberly [name])  04/14/2014 Initial Diagnosis   Recurrent breast cancer, left ([facility])     CURRENT THERAPY: anastrozole and abemaciclib  INTERVAL HISTORY:  Kimberly [name] 51 y.o. female returns for evaluation while on Verzenio and anastrozole.   She is tolerating the current dose of Verzenio well 50 mg p.o. twice daily however she has been taking nausea medication scheduled every day twice a day.    Rest of the pertinent 10 point ROS reviewed and negative  Patient Active Problem List   Diagnosis Date Noted   Chronic pain of right knee 03/08/2022   S/P mastectomy, left 08/28/2021   Goals of care, counseling/discussion 07/18/2021   Microcytic anemia 07/05/2021   Left corneal abrasion 06/30/2021   History of COVID-19 01/27/2021   Elevated LDL cholesterol level 12/30/2020   COPD exacerbation ([place]) 08/24/2020   Oligouria 03/10/2020   History of breast cancer 02/11/2020   Total body pain 01/13/2019   Postmenopausal bleeding 10/25/2017   Chronic pain of both knees 05/03/2017   COPD (chronic obstructive pulmonary disease) ([place]) 03/20/2017   Tachycardia    Mixed  incontinence 09/29/2015   Genetic testing 08/26/2015   Recurrent breast cancer, left ([place]) 04/14/2014   Disorder of bladder 09/29/2008   LOW BACK PAIN, CHRONIC 09/29/2008   Morbid (severe) obesity due to excess calories ([place]) 11/19/2007   ANEMIA, IRON DEFICIENCY, CHRONIC 11/14/2007   Mixed bipolar I disorder ([place]) 02/20/2007   Tobacco use disorder 02/20/2007    has No Known Allergies.  MEDICAL HISTORY: Past Medical History:  Diagnosis Date   Acute cholecystitis 04/17/2019   Anemia    Anxiety    Arthritis    Bipolar 1 disorder ([place])    Blood transfusion without reported diagnosis 2007   post bleeding childbirth   Cancer ([place])    left breast cancer 2015   COPD (chronic obstructive pulmonary disease) ([place])    pt reports she is not on oxygen   COVID-19 virus infection 06/07/2019   Depression    Diabetes mellitus without complication ([place])    gestational   Hand injury, right, initial encounter 03/19/2019   Heart murmur    as a child-not adult-no cardiac work up   History of radiation therapy 08/31/14-10/21/14   left breast/ left supraclavicular 50.4 Gy 28 fx, lef tposterior axillary boost 9.52 Gy 28 fx, left rbeast boost/ 10 Gy 5 fx   Personal history of chemotherapy    Personal history of radiation therapy    Rash and nonspecific skin eruption 12/27/2020    SURGICAL HISTORY: Past Surgical History:  Procedure Laterality Date   BREAST BIOPSY Left 04/01/14   BREAST BIOPSY Right 04/19/14   BREAST LUMPECTOMY WITH AXILLARY LYMPH NODE DISSECTION Bilateral 05/03/14   CHOLECYSTECTOMY N/A 04/18/2019   Procedure: LAPAROSCOPIC CHOLECYSTECTOMY WITH INTRAOPERATIVE CHOLANGIOGRAM;  Surgeon: Jovita Kussmaul, MD;  Location: [facility];  Service: General;  Laterality: N/A;   DILATION AND CURETTAGE OF UTERUS     after childbirth   MASS EXCISION Right 07/25/2021   Procedure: RIGHT AXILLARY MASS EXCISION;  Surgeon: [name], MD;  Location: [facility];  Service: General;  Laterality: Right;    MINOR BREAST BIOPSY Left 07/25/2021   Procedure: SKIN PUNCH BIOPSY LEFT BREAST;  Surgeon: [name], MD;  Location: [facility];  Service: General;  Laterality: Left;   MULTIPLE TOOTH EXTRACTIONS     only 5 left   PORTACATH PLACEMENT Right 06/18/2014   Procedure: INSERTION PORT-A-CATH;  Surgeon: [name], MD;  Location: [facility];  Service: General;  Laterality: Right;   TONSILLECTOMY AND ADENOIDECTOMY     TOTAL MASTECTOMY Left 08/28/2021   Procedure: LEFT TOTAL MASTECTOMY;  Surgeon: [name], MD;  Location: [facility];  Service: General;  Laterality: Left;  90 MINUTES- POST PER KELLY AND SHE WILL PUT IN ROOM    SOCIAL HISTORY: Social History   Socioeconomic History   Marital status: Widowed    Spouse name: Not on file   Number of children: 5   Years of education: 12   Highest education level: High school graduate  Occupational History   Occupation: disabled  Tobacco Use   Smoking status: Every Day    Packs/day: 0.50    Years: 34.00    Total pack years: 17.00    Types: Cigarettes   Smokeless tobacco: Never  Vaping Use   Vaping Use: Never used  Substance and Sexual Activity   Alcohol use: No   Drug use: No   Sexual activity: Not Currently  Other Topics Concern   Not on file  Social History Narrative   Patient lives in [place] with 3 of her children.    One adult son lives "up the road" and one lives in [place].   Patient is on disability.    Patient enjoys spending time with her family and watching TV.    Patient walks around her home for exercise.    Social Determinants of Health   Financial Resource Strain: Medium Risk (11/08/2021)   Overall Financial Resource Strain (CARDIA)    Difficulty of Paying Living Expenses: Somewhat hard  Food Insecurity: Food Insecurity Present (11/08/2021)   Hunger Vital Sign    Worried About Running Out of Food in the Last Year: Sometimes true    Ran Out of Food in the Last Year: Never true   Transportation Needs: No Transportation Needs (10/10/2021)   PRAPARE - Hydrologist (Medical): No    Lack of Transportation (Non-Medical): No  Physical Activity: Inactive (10/10/2021)   Exercise Vital Sign    Days of Exercise per Week: 0 days    Minutes of Exercise per Session: 0 min  Stress: No Stress Concern Present (10/10/2021)   [place]    Feeling of Stress : Not at all  Social Connections: Moderately Integrated (10/10/2021)   Social Connection and Isolation Panel [NHANES]    Frequency of Communication with Friends and Family: More than three times a week    Frequency of Social Gatherings with Friends and Family: More  than three times a week    Attends Religious Services: More than 4 times per year    Active Member of Clubs or Organizations: Yes    Attends Archivist Meetings: More than 4 times per year    Marital Status: Widowed  Intimate Partner Violence: Not At Risk (10/10/2021)   Humiliation, Afraid, Rape, and Kick questionnaire    Fear of Current or Ex-Partner: No    Emotionally Abused: No    Physically Abused: No    Sexually Abused: No    FAMILY HISTORY: Family History  Problem Relation Age of Onset   Heart disease Father    Cancer Father        Prostate   Cancer Maternal Grandmother    Depression Maternal Grandfather    Cancer Paternal Grandmother    Stomach cancer Neg Hx    Esophageal cancer Neg Hx    Rectal cancer Neg Hx     Review of Systems  Constitutional:  Positive for fatigue. Negative for appetite change, chills, fever and unexpected weight change.  HENT:   Negative for hearing loss, lump/mass and trouble swallowing.   Eyes:  Negative for eye problems and icterus.  Respiratory:  Negative for chest tightness, cough and shortness of breath.   Cardiovascular:  Negative for chest pain, leg swelling and palpitations.  Gastrointestinal:  Negative for  abdominal distention, abdominal pain, constipation, diarrhea, nausea and vomiting.  Endocrine: Negative for hot flashes.  Genitourinary:  Negative for difficulty urinating.   Musculoskeletal:  Negative for arthralgias.  Skin:  Positive for rash. Negative for itching.  Neurological:  Negative for dizziness, extremity weakness, headaches and numbness.  Hematological:  Negative for adenopathy. Does not bruise/bleed easily.  Psychiatric/Behavioral:  Negative for depression. The patient is not nervous/anxious.       PHYSICAL EXAMINATION  ECOG PERFORMANCE STATUS: 1 - Symptomatic but completely ambulatory  There were no vitals filed for this visit.   Physical Exam Constitutional:      General: She is not in acute distress.    Appearance: Normal appearance. She is not toxic-appearing.  HENT:     Head: Normocephalic and atraumatic.  Eyes:     General: No scleral icterus. Cardiovascular:     Rate and Rhythm: Normal rate and regular rhythm.     Pulses: Normal pulses.     Heart sounds: Normal heart sounds.  Pulmonary:     Effort: Pulmonary effort is normal.     Breath sounds: Normal breath sounds.  Chest:     Comments: Patch of redness noted in the left chest wall upper inner quadrant with a small scab.  No palpable nodules but again the exam is very limited because she complains of excruciating tenderness on palpation.  She also appears to have had some random scabs on other areas which appears to be secondary to bites and itching. Abdominal:     General: Abdomen is flat. Bowel sounds are normal. There is no distension.     Palpations: Abdomen is soft.     Tenderness: There is no abdominal tenderness.  Musculoskeletal:        General: No swelling.     Cervical back: Neck supple.  Lymphadenopathy:     Cervical: No cervical adenopathy.  Skin:    General: Skin is warm and dry.     Findings: No rash.  Neurological:     General: No focal deficit present.     Mental Status: She is  alert.  Psychiatric:  Mood and Affect: Mood normal.        Behavior: Behavior normal.     LABORATORY DATA:  CBC    Component Value Date/Time   WBC 7.2 05/01/2022 1430   WBC 12.7 (H) 09/17/2021 0554   RBC 4.45 05/01/2022 1430   HGB 10.8 (L) 05/01/2022 1430   HGB 11.1 03/07/2022 1443   HGB 14.4 01/19/2015 1421   HCT 33.8 (L) 05/01/2022 1430   HCT 34.7 03/07/2022 1443   HCT 44.9 01/19/2015 1421   PLT 281 05/01/2022 1430   PLT 266 03/07/2022 1443   MCV 76.0 (L) 05/01/2022 1430   MCV 80 03/07/2022 1443   MCV 79.9 01/19/2015 1421   MCH 24.3 (L) 05/01/2022 1430   MCHC 32.0 05/01/2022 1430   RDW 18.8 (H) 05/01/2022 1430   RDW 19.6 (H) 03/07/2022 1443   RDW 17.3 (H) 01/19/2015 1421   LYMPHSABS 2.3 05/01/2022 1430   LYMPHSABS 1.3 01/19/2015 1421   MONOABS 0.6 05/01/2022 1430   MONOABS 0.6 01/19/2015 1421   EOSABS 0.2 05/01/2022 1430   EOSABS 0.0 01/19/2015 1421   BASOSABS 0.0 05/01/2022 1430   BASOSABS 0.0 01/19/2015 1421    CMP     Component Value Date/Time   NA 136 05/01/2022 1430   NA 138 03/10/2020 1534   NA 139 01/19/2015 1422   K 3.5 05/01/2022 1430   K 3.8 01/19/2015 1422   CL 102 05/01/2022 1430   CO2 26 05/01/2022 1430   CO2 23 01/19/2015 1422   GLUCOSE 94 05/01/2022 1430   GLUCOSE 93 01/19/2015 1422   BUN 7 05/01/2022 1430   BUN 8 03/10/2020 1534   BUN 8.6 01/19/2015 1422   CREATININE 1.35 (H) 05/01/2022 1430   CREATININE 1.1 01/19/2015 1422   CALCIUM 9.9 05/01/2022 1430   CALCIUM 9.3 01/19/2015 1422   PROT 8.3 (H) 05/01/2022 1430   PROT 7.2 02/11/2020 1626   PROT 7.3 01/19/2015 1422   ALBUMIN 4.5 05/01/2022 1430   ALBUMIN 4.4 02/11/2020 1626   ALBUMIN 3.9 01/19/2015 1422   AST 11 (L) 05/01/2022 1430   AST 14 01/19/2015 1422   ALT 5 05/01/2022 1430   ALT 13 01/19/2015 1422   ALKPHOS 64 05/01/2022 1430   ALKPHOS 82 01/19/2015 1422   BILITOT 0.5 05/01/2022 1430   BILITOT 0.35 01/19/2015 1422   GFRNONAA 48 (L) 05/01/2022 1430   GFRAA 76  03/10/2020 1534    Media Information   Document Information  Photos  05/01/2022  05/01/2022 15:12  Attached To:  Office Visit on 05/01/22 with Benay Pike, MD   Source Information  Benay Pike, MD  [facility]    ASSESSMENT and THERAPY PLAN:   This is a 51 year old female patient, BRCA negative status post left lumpectomy and sentinel biopsy for a T1CN0 stage Ia IDC back in July 2015, ER 83% positive, PR 66% positive and MIB of 14%, no HER2 amplification had adjuvant TC followed by radiation, did not take antiestrogen therapy who is now here with recurrent breast cancer status post left mastectomy and adjuvant radiation.  She is currently on abemaciclib and fulvestrant for adjuvant therapy given high risk for recurrence.    She is here for follow-up after she had reported some redness and erythema of the chest wall as mentioned in the above picture.  We have recommended a trial of doxycycline and she is here for follow-up.  We have also held the [facility] for the past 2 weeks. She is due for a mammogram which  we have discussed about.  Thank you for consulting [name] the care of this patient.  Please not hesitate contact us with any additional questions or concerns  No orders of the defined types were placed in this encounter.   All questions were answered. The patient knows to call the clinic with any problems, questions or concerns. We can certainly see the patient much sooner if necessary.  Total encounter time: 30 minutes in face-to-face visit time, chart review, lab review, order entry, care coordination, and documentation of the encounter.  *Total Encounter Time as defined by the Centers for Medicare and Medicaid Services includes, in addition to the face-to-face time of a patient visit (documented in the note above) non-face-to-face time: obtaining and reviewing outside history, ordering and reviewing medications, tests or procedures, care coordination (communications  with other health care professionals or caregivers) and documentation in the medical record.

## 2022-06-26 ENCOUNTER — Emergency Department (HOSPITAL_COMMUNITY): Payer: Medicare Other

## 2022-06-26 ENCOUNTER — Observation Stay (HOSPITAL_COMMUNITY)
Admission: EM | Admit: 2022-06-26 | Discharge: 2022-06-29 | Disposition: A | Discharge status: Home Health | Payer: Medicare Other | Attending: Family Medicine | Admitting: Family Medicine

## 2022-06-26 DIAGNOSIS — E876 Hypokalemia: Secondary | ICD-10-CM | POA: Diagnosis not present

## 2022-06-26 DIAGNOSIS — R062 Wheezing: Secondary | ICD-10-CM | POA: Diagnosis not present

## 2022-06-26 DIAGNOSIS — L309 Dermatitis, unspecified: Secondary | ICD-10-CM

## 2022-06-26 DIAGNOSIS — J80 Acute respiratory distress syndrome: Secondary | ICD-10-CM | POA: Diagnosis not present

## 2022-06-26 DIAGNOSIS — R0603 Acute respiratory distress: Secondary | ICD-10-CM | POA: Diagnosis not present

## 2022-06-26 DIAGNOSIS — Z79899 Other long term (current) drug therapy: Secondary | ICD-10-CM | POA: Diagnosis not present

## 2022-06-26 DIAGNOSIS — R0602 Shortness of breath: Secondary | ICD-10-CM | POA: Diagnosis not present

## 2022-06-26 DIAGNOSIS — J441 Chronic obstructive pulmonary disease with (acute) exacerbation: Secondary | ICD-10-CM | POA: Diagnosis not present

## 2022-06-26 DIAGNOSIS — Z20822 Contact with and (suspected) exposure to covid-19: Secondary | ICD-10-CM | POA: Diagnosis not present

## 2022-06-26 DIAGNOSIS — Z720 Tobacco use: Secondary | ICD-10-CM

## 2022-06-26 DIAGNOSIS — Z853 Personal history of malignant neoplasm of breast: Secondary | ICD-10-CM | POA: Insufficient documentation

## 2022-06-26 DIAGNOSIS — Z743 Need for continuous supervision: Secondary | ICD-10-CM | POA: Diagnosis not present

## 2022-06-26 DIAGNOSIS — R6889 Other general symptoms and signs: Secondary | ICD-10-CM | POA: Diagnosis not present

## 2022-06-26 DIAGNOSIS — R0989 Other specified symptoms and signs involving the circulatory and respiratory systems: Secondary | ICD-10-CM | POA: Diagnosis not present

## 2022-06-26 LAB — I-STAT ARTERIAL BLOOD GAS, ED
Acid-Base Excess: 3 mmol/L — ABNORMAL HIGH (ref 0.0–2.0)
Bicarbonate: 27 mmol/L (ref 20.0–28.0)
Calcium, Ion: 1.21 mmol/L (ref 1.15–1.40)
HCT: 36 % (ref 36.0–46.0)
Hemoglobin: 12.2 g/dL (ref 12.0–15.0)
O2 Saturation: 100 %
Patient temperature: 98
Potassium: 3.4 mmol/L — ABNORMAL LOW (ref 3.5–5.1)
Sodium: 137 mmol/L (ref 135–145)
TCO2: 28 mmol/L (ref 22–32)
pCO2 arterial: 36.7 mmHg (ref 32–48)
pH, Arterial: 7.474 — ABNORMAL HIGH (ref 7.35–7.45)
pO2, Arterial: 334 mmHg — ABNORMAL HIGH (ref 83–108)

## 2022-06-26 LAB — I-STAT CHEM 8, ED
BUN: <3 mg/dL — ABNORMAL LOW (ref 6–20)
Calcium, Ion: 1.11 mmol/L — ABNORMAL LOW (ref 1.15–1.40)
Chloride: 103 mmol/L (ref 98–111)
Creatinine, Ser: 1.1 mg/dL — ABNORMAL HIGH (ref 0.44–1.00)
Glucose, Bld: 98 mg/dL (ref 70–99)
HCT: 38 % (ref 36.0–46.0)
Hemoglobin: 12.9 g/dL (ref 12.0–15.0)
Potassium: 3.6 mmol/L (ref 3.5–5.1)
Sodium: 138 mmol/L (ref 135–145)
TCO2: 22 mmol/L (ref 22–32)

## 2022-06-26 LAB — CBC WITH DIFFERENTIAL/PLATELET
Abs Immature Granulocytes: 0.03 10*3/uL (ref 0.00–0.07)
Basophils Absolute: 0.1 10*3/uL (ref 0.0–0.1)
Basophils Relative: 1 %
Eosinophils Absolute: 0.1 10*3/uL (ref 0.0–0.5)
Eosinophils Relative: 2 %
HCT: 34.6 % — ABNORMAL LOW (ref 36.0–46.0)
Hemoglobin: 11.1 g/dL — ABNORMAL LOW (ref 12.0–15.0)
Immature Granulocytes: 1 %
Lymphocytes Relative: 19 %
Lymphs Abs: 1.1 10*3/uL (ref 0.7–4.0)
MCH: 25.3 pg — ABNORMAL LOW (ref 26.0–34.0)
MCHC: 32.1 g/dL (ref 30.0–36.0)
MCV: 79 fL — ABNORMAL LOW (ref 80.0–100.0)
Monocytes Absolute: 0.5 10*3/uL (ref 0.1–1.0)
Monocytes Relative: 8 %
Neutro Abs: 4.1 10*3/uL (ref 1.7–7.7)
Neutrophils Relative %: 69 %
Platelets: 236 10*3/uL (ref 150–400)
RBC: 4.38 MIL/uL (ref 3.87–5.11)
RDW: 24.1 % — ABNORMAL HIGH (ref 11.5–15.5)
WBC: 5.8 10*3/uL (ref 4.0–10.5)
nRBC: 0 % (ref 0.0–0.2)

## 2022-06-26 LAB — COMPREHENSIVE METABOLIC PANEL
ALT: 10 U/L (ref 0–44)
AST: 20 U/L (ref 15–41)
Albumin: 4.1 g/dL (ref 3.5–5.0)
Alkaline Phosphatase: 69 U/L (ref 38–126)
Anion gap: 16 — ABNORMAL HIGH (ref 5–15)
BUN: <5 mg/dL — ABNORMAL LOW (ref 6–20)
CO2: 21 mmol/L — ABNORMAL LOW (ref 22–32)
Calcium: 9.6 mg/dL (ref 8.9–10.3)
Chloride: 102 mmol/L (ref 98–111)
Creatinine, Ser: 1.17 mg/dL — ABNORMAL HIGH (ref 0.44–1.00)
GFR, Estimated: 57 mL/min — ABNORMAL LOW (ref >60)
Glucose, Bld: 98 mg/dL (ref 70–99)
Potassium: 3.7 mmol/L (ref 3.5–5.1)
Sodium: 139 mmol/L (ref 135–145)
Total Bilirubin: 0.4 mg/dL (ref 0.3–1.2)
Total Protein: 7.8 g/dL (ref 6.5–8.1)

## 2022-06-26 LAB — D-DIMER, QUANTITATIVE: D-Dimer, Quant: 0.45 ug/mL-FEU (ref 0.00–0.50)

## 2022-06-26 LAB — LACTIC ACID, PLASMA: Lactic Acid, Venous: 1.8 mmol/L (ref 0.5–1.9)

## 2022-06-26 LAB — SARS CORONAVIRUS 2 BY RT PCR: SARS Coronavirus 2 by RT PCR: NEGATIVE

## 2022-06-26 MED ORDER — PREDNISONE 20 MG PO TABS
40.0000 mg | ORAL_TABLET | Freq: Every day | ORAL | Status: DC
  Administered 2022-06-27 – 2022-06-29 (×3): 40 mg via ORAL
  Filled 2022-06-26 (×3): qty 2

## 2022-06-26 MED ORDER — UMECLIDINIUM-VILANTEROL 62.5-25 MCG/ACT IN AEPB
1.0000 | INHALATION_SPRAY | Freq: Every day | RESPIRATORY_TRACT | Status: DC
  Administered 2022-06-27: 1 via RESPIRATORY_TRACT
  Filled 2022-06-26: qty 14

## 2022-06-26 MED ORDER — QUETIAPINE FUMARATE 100 MG PO TABS
400.0000 mg | ORAL_TABLET | Freq: Every day | ORAL | Status: DC
  Administered 2022-06-26 – 2022-06-28 (×3): 400 mg via ORAL
  Filled 2022-06-26 (×3): qty 4

## 2022-06-26 MED ORDER — IPRATROPIUM-ALBUTEROL 0.5-2.5 (3) MG/3ML IN SOLN
3.0000 mL | Freq: Once | RESPIRATORY_TRACT | Status: AC
Start: 2022-06-26 — End: 2022-06-26
  Administered 2022-06-26: 3 mL via RESPIRATORY_TRACT
  Filled 2022-06-26: qty 3

## 2022-06-26 MED ORDER — LACTATED RINGERS IV BOLUS
500.0000 mL | Freq: Once | INTRAVENOUS | Status: AC
  Administered 2022-06-26: 500 mL via INTRAVENOUS

## 2022-06-26 MED ORDER — DOXEPIN HCL 25 MG PO CAPS
75.0000 mg | ORAL_CAPSULE | Freq: Every day | ORAL | Status: DC
  Administered 2022-06-26 – 2022-06-28 (×3): 75 mg via ORAL
  Filled 2022-06-26 (×6): qty 3

## 2022-06-26 MED ORDER — DOXYCYCLINE HYCLATE 100 MG PO TABS
100.0000 mg | ORAL_TABLET | Freq: Two times a day (BID) | ORAL | Status: DC
  Administered 2022-06-26 – 2022-06-29 (×6): 100 mg via ORAL
  Filled 2022-06-26 (×6): qty 1

## 2022-06-26 MED ORDER — BUPROPION HCL ER (XL) 150 MG PO TB24
150.0000 mg | ORAL_TABLET | Freq: Every day | ORAL | Status: DC
  Filled 2022-06-26: qty 1

## 2022-06-26 MED ORDER — ALBUTEROL SULFATE (2.5 MG/3ML) 0.083% IN NEBU
2.5000 mg | INHALATION_SOLUTION | RESPIRATORY_TRACT | Status: DC | PRN
  Administered 2022-06-27 – 2022-06-28 (×2): 2.5 mg via RESPIRATORY_TRACT
  Filled 2022-06-26 (×2): qty 3

## 2022-06-26 MED ORDER — ROSUVASTATIN CALCIUM 5 MG PO TABS
10.0000 mg | ORAL_TABLET | Freq: Every day | ORAL | Status: DC
  Administered 2022-06-26 – 2022-06-28 (×3): 10 mg via ORAL
  Filled 2022-06-26 (×3): qty 2

## 2022-06-26 MED ORDER — MONTELUKAST SODIUM 10 MG PO TABS
10.0000 mg | ORAL_TABLET | Freq: Every day | ORAL | Status: DC
  Administered 2022-06-26: 10 mg via ORAL
  Filled 2022-06-26: qty 1

## 2022-06-26 MED ORDER — VANCOMYCIN HCL 10 G IV SOLR
2500.0000 mg | Freq: Once | INTRAVENOUS | Status: AC
  Administered 2022-06-26: 2500 mg via INTRAVENOUS
  Filled 2022-06-26: qty 2500

## 2022-06-26 MED ORDER — ACETAMINOPHEN 325 MG PO TABS
650.0000 mg | ORAL_TABLET | Freq: Four times a day (QID) | ORAL | Status: DC | PRN
  Administered 2022-06-26 – 2022-06-27 (×2): 650 mg via ORAL
  Filled 2022-06-26 (×2): qty 2

## 2022-06-26 MED ORDER — ANASTROZOLE 1 MG PO TABS
1.0000 mg | ORAL_TABLET | Freq: Every day | ORAL | Status: DC
  Administered 2022-06-27 – 2022-06-29 (×3): 1 mg via ORAL
  Filled 2022-06-26 (×3): qty 1

## 2022-06-26 MED ORDER — BUSPIRONE HCL 5 MG PO TABS
15.0000 mg | ORAL_TABLET | Freq: Three times a day (TID) | ORAL | Status: DC
  Administered 2022-06-26 – 2022-06-29 (×8): 15 mg via ORAL
  Filled 2022-06-26 (×2): qty 3
  Filled 2022-06-26: qty 2
  Filled 2022-06-26 (×2): qty 3
  Filled 2022-06-26: qty 2
  Filled 2022-06-26 (×2): qty 3

## 2022-06-26 MED ORDER — PANTOPRAZOLE SODIUM 40 MG PO TBEC
40.0000 mg | DELAYED_RELEASE_TABLET | Freq: Every day | ORAL | Status: DC
  Administered 2022-06-26 – 2022-06-29 (×4): 40 mg via ORAL
  Filled 2022-06-26 (×4): qty 1

## 2022-06-26 MED ORDER — ABEMACICLIB 50 MG PO TABS
50.0000 mg | ORAL_TABLET | Freq: Two times a day (BID) | ORAL | Status: DC
  Administered 2022-06-26 – 2022-06-29 (×5): 50 mg via ORAL
  Filled 2022-06-26 (×4): qty 1

## 2022-06-26 MED ORDER — IPRATROPIUM-ALBUTEROL 0.5-2.5 (3) MG/3ML IN SOLN: 3.0000 mL | RESPIRATORY_TRACT | Status: DC | PRN

## 2022-06-26 MED ORDER — METRONIDAZOLE 500 MG/100ML IV SOLN
500.0000 mg | Freq: Once | INTRAVENOUS | Status: AC
  Administered 2022-06-26: 500 mg via INTRAVENOUS
  Filled 2022-06-26: qty 100

## 2022-06-26 MED ORDER — ENOXAPARIN SODIUM 60 MG/0.6ML IJ SOSY
60.0000 mg | PREFILLED_SYRINGE | INTRAMUSCULAR | Status: DC
  Administered 2022-06-27 – 2022-06-29 (×3): 60 mg via SUBCUTANEOUS
  Filled 2022-06-26 (×3): qty 0.6

## 2022-06-26 MED ORDER — UMECLIDINIUM-VILANTEROL 62.5-25 MCG/ACT IN AEPB
1.0000 | INHALATION_SPRAY | Freq: Every day | RESPIRATORY_TRACT | Status: DC
  Filled 2022-06-26: qty 14

## 2022-06-26 MED ORDER — SODIUM CHLORIDE 0.9 % IV SOLN
2.0000 g | Freq: Once | INTRAVENOUS | Status: AC
  Administered 2022-06-26: 2 g via INTRAVENOUS
  Filled 2022-06-26: qty 12.5

## 2022-06-26 MED ORDER — FLUOXETINE HCL 20 MG PO CAPS
40.0000 mg | ORAL_CAPSULE | Freq: Every day | ORAL | Status: DC
  Administered 2022-06-26: 40 mg via ORAL
  Filled 2022-06-26 (×2): qty 2

## 2022-06-26 MED ORDER — PRAZOSIN HCL 1 MG PO CAPS
1.0000 mg | ORAL_CAPSULE | Freq: Every day | ORAL | Status: DC
  Administered 2022-06-26 – 2022-06-28 (×3): 1 mg via ORAL
  Filled 2022-06-26 (×6): qty 1

## 2022-06-26 MED ORDER — DOXEPIN HCL 75 MG PO CAPS
75.0000 mg | ORAL_CAPSULE | Freq: Every day | ORAL | Status: DC
  Filled 2022-06-26: qty 1

## 2022-06-26 NOTE — ED Provider Notes (Signed)
[name] Provider Note   CSN: [id number] Arrival date & time: 06/26/22  1238     History  Chief Complaint  Patient presents with   Shortness of Breath    [name] is a 51 y.o. female.  HPI 51 yo female hos breast ca, copd presents with dyspnea.  Patient states began with fever 2 days ago f/b productive cough.  EMS gave magnesium, albuterol 5, solumedrol 125 and cpap.  Patient presents on cpap with cough.    Home Medications Prior to Admission medications   Medication Sig Start Date End Date Taking? Authorizing Provider  abemaciclib (VERZENIO) 50 MG tablet Take 1 tablet (50 mg total) by mouth 2 (two) times daily. Swallow tablets whole. Do not chew, crush, or split tablets before swallowing. 02/02/22   Benay Pike, MD  acetaminophen (TYLENOL) 650 MG CR tablet Take 650 mg by mouth every 8 (eight) hours as needed for pain or fever.    [provider]  albuterol (VENTOLIN HFA) 108 (90 Base) MCG/ACT inhaler USE 2 INHALATIONS BY MOUTH  EVERY 6 HOURS AS NEEDED FOR WHEEZING OR SHORTNESS OF  BREATH Patient taking differently: 2 puffs every 6 (six) hours as needed for wheezing or shortness of breath. 08/25/21   Lurline Del, DO  AMBULATORY NON FORMULARY MEDICATION Medication Name: Diltiazem 2%/ Lidocaine 5% Using your index finger, apply a small amount of medication inside the rectum up to your first knuckle/joint 4 daily x 8 weeks. 08/07/21   Esterwood, Amy S, [name]  anastrozole (ARIMIDEX) 1 MG tablet Take 1 mg by mouth daily. 12/25/21   [provider]  buPROPion (WELLBUTRIN XL) 150 MG 24 hr tablet Take 150 mg by mouth daily.    [provider]  busPIRone (BUSPAR) 15 MG tablet Take 15 mg by mouth 3 (three) times daily. 10/29/21   [provider]  doxepin (SINEQUAN) 75 MG capsule Take 75 mg by mouth at bedtime. 11/04/21   [provider]  doxycycline (VIBRA-TABS) 100 MG tablet Take 1 tablet (100 mg  total) by mouth 2 (two) times daily. 05/01/22   Benay Pike, MD  ferrous sulfate 324 MG TBEC Take 1 tablet (324 mg total) by mouth every other day. 03/08/22   Lurline Del, DO  FLUoxetine (PROZAC) 40 MG capsule Take 40 mg by mouth at bedtime.    [provider]  Fulvestrant (FASLODEX IM) Inject 2 Doses into the muscle every 14 (fourteen) days. Every other Tuesday    [provider]  hydrocortisone (ANUSOL-HC) 25 MG suppository Use every other night for 2-weeks until prescription is complete 08/11/21   Mansouraty, Telford Nab., MD  lamoTRIgine (LAMICTAL) 200 MG tablet Take 200 mg by mouth at bedtime.    [provider]  lidocaine-prilocaine (EMLA) cream Apply 1 application topically as needed. 07/21/21   Magrinat, Virgie Dad, MD  loperamide (IMODIUM) 2 MG capsule Take 2 mg by mouth daily as needed for diarrhea or loose stools. Take 2 tablets (4 mg) with first sign of diarrhea, followed by 1 tablet (2 mg) with future occurrences of diarrhea. Take up to 8 tablets (16 mg) in 24 hours.    [provider]  methocarbamol (ROBAXIN) 750 MG tablet Take 1 tablet (750 mg total) by mouth 4 (four) times daily. 08/31/21   [name], MD  montelukast (SINGULAIR) 10 MG tablet Take 1 tablet (10 mg total) by mouth at bedtime. 01/25/21   Fenton Foy, NP  ondansetron (ZOFRAN) 8 MG tablet Take  1 tablet (8 mg) by mouth every 8 hours as needed for nausea and vomiting. 12/06/21   Benay Pike, MD  pantoprazole (PROTONIX) 40 MG tablet Take 1 tablet (40 mg total) by mouth daily. 06/10/21   Milton Ferguson, MD  prazosin (MINIPRESS) 1 MG capsule Take 1 mg by mouth at bedtime. 03/29/21   [provider]  prochlorperazine (COMPAZINE) 5 MG tablet TAKE 1 TABLET (5 MG) BY MOUTH EVERY 6 HOURS AS NEEDED FOR NAUSEA OR VOMITING. MAY TAKE 2 TABLETS (10 mg) IF 5 MG NOT SUFFICIENT. Patient taking differently: Take 5 mg by mouth every 6 (six) hours as needed for vomiting or nausea. 12/06/21    Benay Pike, MD  QUEtiapine (SEROQUEL) 400 MG tablet Take 400 mg by mouth at bedtime.  11/29/19   [provider]  rosuvastatin (CRESTOR) 10 MG tablet Take 1 tablet (10 mg total) by mouth daily. 06/08/21   Concepcion Living, MD  Spacer/Aero-Holding Chambers (AEROCHAMBER PLUS) inhaler Use with inhaler 01/20/21   Melynda Ripple, MD  Tiotropium Bromide Monohydrate (SPIRIVA RESPIMAT) 1.25 MCG/ACT AERS Inhale 2 puffs into the lungs daily. 01/25/21 09/17/22  Fenton Foy, NP      Allergies    Patient has no known allergies.    Review of Systems   Review of Systems  Physical Exam Updated Vital Signs BP (!) 133/109   Pulse (!) 102   Temp 99.1 F (37.3 C) (Rectal)   Resp 19   Ht 1.727 m ('5\' 8"'$ )   Wt 127 kg   SpO2 99%   BMI 42.57 kg/m  Physical Exam Vitals and nursing note reviewed.  Constitutional:      General: She is in acute distress.     Appearance: She is obese. She is ill-appearing.  HENT:     Head: Normocephalic.     Mouth/Throat:     Mouth: Mucous membranes are moist.  Eyes:     Pupils: Pupils are equal, round, and reactive to light.  Cardiovascular:     Rate and Rhythm: Normal rate and regular rhythm.  Pulmonary:     Effort: Tachypnea and accessory muscle usage present.     Breath sounds: Examination of the right-upper field reveals rhonchi. Examination of the left-upper field reveals rhonchi. Examination of the right-middle field reveals rhonchi. Examination of the left-middle field reveals rhonchi. Examination of the right-lower field reveals rhonchi. Examination of the left-lower field reveals rhonchi. Rhonchi present.  Chest:     Comments: Port right chest A/p mastectomy right  Abdominal:     General: Bowel sounds are normal.     Palpations: Abdomen is soft.  Musculoskeletal:        General: Normal range of motion.     Cervical back: Normal range of motion.  Skin:    General: Skin is warm and dry.     Capillary Refill: Capillary refill takes less  than 2 seconds.  Neurological:     General: No focal deficit present.     Mental Status: She is alert.  Psychiatric:        Mood and Affect: Mood normal.     ED Results / Procedures / Treatments   Labs (all labs ordered are listed, but only abnormal results are displayed) Labs Reviewed  CBC WITH DIFFERENTIAL/PLATELET - Abnormal; Notable for the following components:      Result Value   Hemoglobin 11.1 (*)    HCT 34.6 (*)    MCV 79.0 (*)    MCH 25.3 (*)  RDW 24.1 (*)    All other components within normal limits  COMPREHENSIVE METABOLIC PANEL - Abnormal; Notable for the following components:   CO2 21 (*)    BUN <5 (*)    Creatinine, Ser 1.17 (*)    GFR, Estimated 57 (*)    Anion gap 16 (*)    All other components within normal limits  I-STAT ARTERIAL BLOOD GAS, ED - Abnormal; Notable for the following components:   pH, Arterial 7.474 (*)    pO2, Arterial 334 (*)    Acid-Base Excess 3.0 (*)    Potassium 3.4 (*)    All other components within normal limits  I-STAT CHEM 8, ED - Abnormal; Notable for the following components:   BUN <3 (*)    Creatinine, Ser 1.10 (*)    Calcium, Ion 1.11 (*)    All other components within normal limits  SARS CORONAVIRUS 2 BY RT PCR  CULTURE, BLOOD (ROUTINE X 2)  CULTURE, BLOOD (ROUTINE X 2)  LACTIC ACID, PLASMA  D-DIMER, QUANTITATIVE  LACTIC ACID, PLASMA    EKG EKG Interpretation  Date/Time:  Tuesday June 26 2022 12:45:29 EDT Ventricular Rate:  99 PR Interval:  207 QRS Duration: 108 QT Interval:  357 QTC Calculation: 459 R Axis:   61 Text Interpretation: Sinus rhythm Prolonged PR interval Borderline T wave abnormalities Confirmed by Pattricia Boss [phone]) on 06/26/2022 12:57:15 PM  Radiology DG Chest Port 1 View  Result Date: 06/26/2022 CLINICAL DATA:  Shortness of breath with wheezing and rhonchi. EXAM: PORTABLE CHEST 1 VIEW COMPARISON:  Chest radiograph 09/17/2021 FINDINGS: A right chest wall port is in place with tip  terminating in the lower SVC. The cardiomediastinal silhouette is normal There is no focal consolidation or pulmonary edema. There is no pleural effusion or pneumothorax There is no acute osseous abnormality. IMPRESSION: No radiographic evidence of acute cardiopulmonary process. Electronically Signed   By: Valetta Mole M.D.   On: 06/26/2022 14:47    Procedures Procedures    Medications Ordered in ED Medications  vancomycin (VANCOCIN) 2,500 mg in sodium chloride 0.9 % 500 mL IVPB (0 mg Intravenous Stopped 06/26/22 1537)  ceFEPIme (MAXIPIME) 2 g in sodium chloride 0.9 % 100 mL IVPB (0 g Intravenous Stopped 06/26/22 1331)  metroNIDAZOLE (FLAGYL) IVPB 500 mg (0 mg Intravenous Stopped 06/26/22 1537)  lactated ringers bolus 500 mL (0 mLs Intravenous Stopped 06/26/22 1337)    ED Course/ Medical Decision Making/ A&P Clinical Course as of 06/26/22 1557  Tue Jun 26, 2022  1420 DG Chest [place] 1 View [DR]  1505 Chest x-Julieth Tugman reviewed interpreted no evidence of acute infiltrate noted and radiologist interpretation concurs [DR]  1512 BC reviewed and interpreted with stable anemia [DR]  1512 D-dimer reviewed interpreted normal [DR]  6010 Complete metabolic panel reviewed interpreted sodium potassium chloride normal CO2 is slightly decreased Creatinine is slightly increased but stable from prior [DR]    Clinical Course User Index [DR] Pattricia Boss, MD                           Medical Decision Making Patient warm to palpation- broad spectrum abx initiated Abg done on oxygen- 7.46/37/336- will change to [place] 51 yo female ho copd, lung cancer presents with fever chills dyspnea and cough. Initially on bipap ABG without acute hypoxia or hypercarbia CXR without acute focal infiltrate Some mild wheezing, good air movement Patient had solumedrol, magnesium, albuterol, and is nowwith improved respiratory status. Plan  admission for copd exacerbation. Care discussed with Dr. Oleh Genin, on call for [facility] and  will see for admission   Amount and/or Complexity of Data Reviewed Labs: ordered. Decision-making details documented in ED Course. Radiology: ordered. Decision-making details documented in ED Course. ECG/medicine tests: ordered and independent interpretation performed. Decision-making details documented in ED Course.  Risk Prescription drug management. Decision regarding hospitalization.  Critical Care Total time providing critical care: 30 minutes           Final Clinical Impression(s) / ED Diagnoses Final diagnoses:  COPD exacerbation ([place])  Respiratory distress    Rx / DC Orders ED Discharge Orders     None         Pattricia Boss, MD 06/26/22 1557

## 2022-06-26 NOTE — ED Triage Notes (Signed)
Pt BIBGEMS for c/o SOB with bilateral wheezing and rhonchi. Pt labored breathing upon EMS arrival but maintaining oxygen saturation. Pt does not wear oxygen at home.
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
## 2022-06-26 NOTE — ED Notes (Signed)
Pt used bedside commode with standby assist

## 2022-06-26 NOTE — Assessment & Plan Note (Addendum)
Improved today and patient denies pain.
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
## 2022-06-26 NOTE — ED Notes (Signed)
Pt getting dizzy while ambulating. Oxygen saturation stayed between 96-99% during ambulation.

## 2022-06-26 NOTE — Assessment & Plan Note (Addendum)
Breathing improved from previous day and patient feels stable to d/c. - Continue on doxycycline, prednisone (day #3, continue 2 days outpatient), Anoro, PRN albuterol - Continue Duonebs TID, prn q4 for breakthrough wheezing - Not requiring oxygen, comfortable on RA - Ready to quit smoking, declines nicotine patch

## 2022-06-26 NOTE — ED Notes (Signed)
Pt provided with cola, okay per provider.

## 2022-06-27 ENCOUNTER — Other Ambulatory Visit: Payer: Self-pay

## 2022-06-27 ENCOUNTER — Encounter (HOSPITAL_COMMUNITY): Payer: Self-pay | Admitting: Family Medicine

## 2022-06-27 DIAGNOSIS — Z720 Tobacco use: Secondary | ICD-10-CM

## 2022-06-27 DIAGNOSIS — J441 Chronic obstructive pulmonary disease with (acute) exacerbation: Secondary | ICD-10-CM | POA: Diagnosis not present

## 2022-06-27 DIAGNOSIS — Z853 Personal history of malignant neoplasm of breast: Secondary | ICD-10-CM

## 2022-06-27 DIAGNOSIS — D509 Iron deficiency anemia, unspecified: Secondary | ICD-10-CM | POA: Diagnosis not present

## 2022-06-27 DIAGNOSIS — J9601 Acute respiratory failure with hypoxia: Secondary | ICD-10-CM | POA: Diagnosis not present

## 2022-06-27 DIAGNOSIS — F319 Bipolar disorder, unspecified: Secondary | ICD-10-CM | POA: Insufficient documentation

## 2022-06-27 DIAGNOSIS — L309 Dermatitis, unspecified: Secondary | ICD-10-CM

## 2022-06-27 LAB — CBC WITH DIFFERENTIAL/PLATELET
Abs Immature Granulocytes: 0.03 10*3/uL (ref 0.00–0.07)
Basophils Absolute: 0 10*3/uL (ref 0.0–0.1)
Basophils Relative: 0 %
Eosinophils Absolute: 0 10*3/uL (ref 0.0–0.5)
Eosinophils Relative: 0 %
HCT: 30.6 % — ABNORMAL LOW (ref 36.0–46.0)
Hemoglobin: 9.9 g/dL — ABNORMAL LOW (ref 12.0–15.0)
Immature Granulocytes: 1 %
Lymphocytes Relative: 13 %
Lymphs Abs: 0.5 10*3/uL — ABNORMAL LOW (ref 0.7–4.0)
MCH: 25.7 pg — ABNORMAL LOW (ref 26.0–34.0)
MCHC: 32.4 g/dL (ref 30.0–36.0)
MCV: 79.5 fL — ABNORMAL LOW (ref 80.0–100.0)
Monocytes Absolute: 0.1 10*3/uL (ref 0.1–1.0)
Monocytes Relative: 3 %
Neutro Abs: 3 10*3/uL (ref 1.7–7.7)
Neutrophils Relative %: 83 %
Platelets: 210 10*3/uL (ref 150–400)
RBC: 3.85 MIL/uL — ABNORMAL LOW (ref 3.87–5.11)
RDW: 24.4 % — ABNORMAL HIGH (ref 11.5–15.5)
WBC: 3.7 10*3/uL — ABNORMAL LOW (ref 4.0–10.5)
nRBC: 0 % (ref 0.0–0.2)

## 2022-06-27 LAB — TROPONIN I (HIGH SENSITIVITY)
Troponin I (High Sensitivity): 10 ng/L (ref <18)
Troponin I (High Sensitivity): 12 ng/L (ref <18)

## 2022-06-27 LAB — GLUCOSE, CAPILLARY: Glucose-Capillary: 128 mg/dL — ABNORMAL HIGH (ref 70–99)

## 2022-06-27 MED ORDER — ONDANSETRON HCL 4 MG PO TABS
8.0000 mg | ORAL_TABLET | Freq: Three times a day (TID) | ORAL | Status: DC | PRN
  Administered 2022-06-27: 8 mg via ORAL
  Filled 2022-06-27 (×2): qty 2

## 2022-06-27 MED ORDER — HYDROCORTISONE 1 % EX CREA
TOPICAL_CREAM | Freq: Two times a day (BID) | CUTANEOUS | Status: DC
Start: 2022-06-27 — End: 2022-06-28
  Filled 2022-06-27 (×2): qty 28

## 2022-06-27 NOTE — ED Notes (Signed)
MD at bedside. 

## 2022-06-27 NOTE — Care Management Obs Status (Signed)
[place] NOTIFICATION   Patient Details  Name: Kimberly [name] MRN: [id number] Date of Birth: 15-Feb-1971   Medicare Observation Status Notification Given:  Yes    Fuller Mandril, RN 06/27/2022, 2:59 PM

## 2022-06-27 NOTE — Plan of Care (Signed)
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
## 2022-06-27 NOTE — Assessment & Plan Note (Addendum)
Resolved. Patient denies chest pain and no skin changes noted on exam. -Change topical steroids to prn itching symptoms

## 2022-06-27 NOTE — ED Notes (Signed)
Receiving RN notified that pt is missing medication however no note in the chart regarding bringing any medication

## 2022-06-28 ENCOUNTER — Other Ambulatory Visit (HOSPITAL_COMMUNITY): Payer: Self-pay

## 2022-06-28 ENCOUNTER — Telehealth (HOSPITAL_COMMUNITY): Payer: Self-pay | Admitting: Pharmacy Technician

## 2022-06-28 DIAGNOSIS — L309 Dermatitis, unspecified: Secondary | ICD-10-CM | POA: Diagnosis not present

## 2022-06-28 DIAGNOSIS — E876 Hypokalemia: Secondary | ICD-10-CM

## 2022-06-28 DIAGNOSIS — R0603 Acute respiratory distress: Secondary | ICD-10-CM | POA: Diagnosis not present

## 2022-06-28 DIAGNOSIS — J441 Chronic obstructive pulmonary disease with (acute) exacerbation: Secondary | ICD-10-CM | POA: Diagnosis not present

## 2022-06-28 DIAGNOSIS — Z720 Tobacco use: Secondary | ICD-10-CM | POA: Diagnosis not present

## 2022-06-28 LAB — BASIC METABOLIC PANEL
Anion gap: 10 (ref 5–15)
BUN: 11 mg/dL (ref 6–20)
CO2: 25 mmol/L (ref 22–32)
Calcium: 9.8 mg/dL (ref 8.9–10.3)
Chloride: 104 mmol/L (ref 98–111)
Creatinine, Ser: 1.42 mg/dL — ABNORMAL HIGH (ref 0.44–1.00)
GFR, Estimated: 45 mL/min — ABNORMAL LOW (ref >60)
Glucose, Bld: 113 mg/dL — ABNORMAL HIGH (ref 70–99)
Potassium: 3.3 mmol/L — ABNORMAL LOW (ref 3.5–5.1)
Sodium: 139 mmol/L (ref 135–145)

## 2022-06-28 LAB — CBC
HCT: 30.9 % — ABNORMAL LOW (ref 36.0–46.0)
Hemoglobin: 9.8 g/dL — ABNORMAL LOW (ref 12.0–15.0)
MCH: 25.5 pg — ABNORMAL LOW (ref 26.0–34.0)
MCHC: 31.7 g/dL (ref 30.0–36.0)
MCV: 80.3 fL (ref 80.0–100.0)
Platelets: 239 10*3/uL (ref 150–400)
RBC: 3.85 MIL/uL — ABNORMAL LOW (ref 3.87–5.11)
RDW: 24.6 % — ABNORMAL HIGH (ref 11.5–15.5)
WBC: 5.8 10*3/uL (ref 4.0–10.5)
nRBC: 0 % (ref 0.0–0.2)

## 2022-06-28 MED ORDER — POTASSIUM CHLORIDE 20 MEQ PO PACK: 20.0000 meq | PACK | Freq: Once | ORAL | Status: DC

## 2022-06-28 MED ORDER — POTASSIUM CHLORIDE 20 MEQ PO PACK
40.0000 meq | PACK | Freq: Once | ORAL | Status: AC
Start: 2022-06-28 — End: 2022-06-28
  Administered 2022-06-28: 40 meq via ORAL
  Filled 2022-06-28: qty 2

## 2022-06-28 MED ORDER — IPRATROPIUM-ALBUTEROL 0.5-2.5 (3) MG/3ML IN SOLN
3.0000 mL | RESPIRATORY_TRACT | Status: DC
  Administered 2022-06-28: 3 mL via RESPIRATORY_TRACT
  Filled 2022-06-28: qty 3

## 2022-06-28 MED ORDER — UMECLIDINIUM-VILANTEROL 62.5-25 MCG/ACT IN AEPB
1.0000 | INHALATION_SPRAY | Freq: Every day | RESPIRATORY_TRACT | Status: DC
  Administered 2022-06-29: 1 via RESPIRATORY_TRACT
  Filled 2022-06-28: qty 14

## 2022-06-28 MED ORDER — ONDANSETRON HCL 4 MG PO TABS: 8.0000 mg | ORAL_TABLET | Freq: Two times a day (BID) | ORAL | Status: DC

## 2022-06-28 MED ORDER — IPRATROPIUM-ALBUTEROL 0.5-2.5 (3) MG/3ML IN SOLN
3.0000 mL | Freq: Three times a day (TID) | RESPIRATORY_TRACT | Status: DC
  Administered 2022-06-28 – 2022-06-29 (×3): 3 mL via RESPIRATORY_TRACT
  Filled 2022-06-28 (×2): qty 3

## 2022-06-28 MED ORDER — IPRATROPIUM-ALBUTEROL 0.5-2.5 (3) MG/3ML IN SOLN: 3.0000 mL | RESPIRATORY_TRACT | Status: DC | PRN

## 2022-06-28 MED ORDER — HYDROCORTISONE 1 % EX CREA
TOPICAL_CREAM | Freq: Three times a day (TID) | CUTANEOUS | Status: DC | PRN
Start: 2022-06-28 — End: 2022-06-29

## 2022-06-28 MED ORDER — ONDANSETRON HCL 4 MG PO TABS
8.0000 mg | ORAL_TABLET | Freq: Two times a day (BID) | ORAL | Status: DC
  Administered 2022-06-28 – 2022-06-29 (×3): 8 mg via ORAL
  Filled 2022-06-28 (×2): qty 2

## 2022-06-28 NOTE — Assessment & Plan Note (Addendum)
Initial K 3.3. Ordered one time 80mq KCl. Repeat K 2.8.
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
## 2022-06-28 NOTE — Discharge Instructions (Addendum)
Dear [name],   Thank you so much for allowing [name] to be part of your care!  You were admitted to [facility] for a COPD exacerbation that we are treating with steroids and antibiotics.   You will complete your doxycycline and steroids on 9/10 or until you run out   We are starting you on an inhaler called Anoro Ellipta   POST-HOSPITAL & CARE INSTRUCTIONS Please follow up with [place] for hospital follow up  Please let PCP/Specialists know of any changes that were made.  Please see medications section of this packet for any medication changes.   DOCTOR'S APPOINTMENT & FOLLOW UP CARE INSTRUCTIONS  Future Appointments  Date Time Provider [place]  07/05/2022  1:30 PM ACCESS TO CARE POOL [id number] [place]    RETURN PRECAUTIONS:  Return if you experience any worsening Shortness of breath, chest pain, swelling in the extremities, fevers, chills   Take care and be well!  [facility]  [place], [place] [address] [phone]

## 2022-06-28 NOTE — Plan of Care (Signed)
  Problem: Clinical Measurements: Goal: Respiratory complications will improve 06/28/2022 2005 by Berneta Levins, RN Outcome: Progressing 06/28/2022 1305 by Berneta Levins, RN Outcome: Not Progressing Goal: Cardiovascular complication will be avoided Outcome: Progressing   Problem: Activity: Goal: Risk for activity intolerance will decrease 06/28/2022 2005 by Berneta Levins, RN Outcome: Progressing 06/28/2022 1305 by Berneta Levins, RN Outcome: Progressing   Problem: Nutrition: Goal: Adequate nutrition will be maintained Outcome: Progressing   Problem: Coping: Goal: Level of anxiety will decrease Outcome: Progressing   Problem: Elimination: Goal: Will not experience complications related to urinary retention Outcome: Progressing
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
## 2022-06-28 NOTE — TOC Benefit Eligibility Note (Signed)
Patient Teacher, English as a foreign language completed.    The patient is currently admitted and upon discharge could be taking Anoro Ellipta 62.5 mcg.  The current 30 day co-pay is $47.00.   The patient is insured through [place], [place] Patient Advocate Specialist [place] Patient Advocate Team Direct Number: [phone]  Fax: [phone]

## 2022-06-28 NOTE — Plan of Care (Signed)
  Problem: Clinical Measurements: Goal: Cardiovascular complication will be avoided Outcome: Progressing   Problem: Activity: Goal: Risk for activity intolerance will decrease Outcome: Progressing   Problem: Nutrition: Goal: Adequate nutrition will be maintained Outcome: Progressing   Problem: Coping: Goal: Level of anxiety will decrease Outcome: Progressing   Problem: Elimination: Goal: Will not experience complications related to urinary retention Outcome: Progressing   Problem: Clinical Measurements: Goal: Respiratory complications will improve Outcome: Not Progressing
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
## 2022-06-28 NOTE — Telephone Encounter (Signed)
Pharmacy Patient Advocate Encounter  Insurance verification completed.    The patient is insured through [employer] Part D   The patient is currently admitted and ran test claims for the following: Anoro Ellipta.  Copays and coinsurance results were relayed to Inpatient clinical team.

## 2022-06-29 ENCOUNTER — Other Ambulatory Visit (HOSPITAL_COMMUNITY): Payer: Self-pay

## 2022-06-29 DIAGNOSIS — L309 Dermatitis, unspecified: Secondary | ICD-10-CM | POA: Diagnosis not present

## 2022-06-29 DIAGNOSIS — J441 Chronic obstructive pulmonary disease with (acute) exacerbation: Secondary | ICD-10-CM | POA: Diagnosis not present

## 2022-06-29 DIAGNOSIS — R531 Weakness: Secondary | ICD-10-CM | POA: Diagnosis not present

## 2022-06-29 DIAGNOSIS — R0603 Acute respiratory distress: Secondary | ICD-10-CM | POA: Diagnosis not present

## 2022-06-29 LAB — BASIC METABOLIC PANEL
Anion gap: 12 (ref 5–15)
BUN: 14 mg/dL (ref 6–20)
CO2: 24 mmol/L (ref 22–32)
Calcium: 9.4 mg/dL (ref 8.9–10.3)
Chloride: 102 mmol/L (ref 98–111)
Creatinine, Ser: 1.24 mg/dL — ABNORMAL HIGH (ref 0.44–1.00)
GFR, Estimated: 53 mL/min — ABNORMAL LOW (ref >60)
Glucose, Bld: 100 mg/dL — ABNORMAL HIGH (ref 70–99)
Potassium: 2.8 mmol/L — ABNORMAL LOW (ref 3.5–5.1)
Sodium: 138 mmol/L (ref 135–145)

## 2022-06-29 MED ORDER — POTASSIUM CHLORIDE 20 MEQ PO PACK
40.0000 meq | PACK | Freq: Once | ORAL | Status: AC
  Administered 2022-06-29: 40 meq via ORAL
  Filled 2022-06-29: qty 2

## 2022-06-29 MED ORDER — PREDNISONE 20 MG PO TABS
40.0000 mg | ORAL_TABLET | Freq: Every day | ORAL | 0 refills | Status: AC
Start: 2022-06-29 — End: 2022-07-01
  Filled 2022-06-29: qty 4, 2d supply, fill #0

## 2022-06-29 MED ORDER — DOXYCYCLINE HYCLATE 100 MG PO TABS
100.0000 mg | ORAL_TABLET | Freq: Two times a day (BID) | ORAL | 0 refills | Status: AC
Start: 2022-06-29 — End: 2022-07-01
  Filled 2022-06-29: qty 4, 2d supply, fill #0

## 2022-06-29 MED ORDER — PANTOPRAZOLE SODIUM 40 MG PO TBEC: 40.0000 mg | DELAYED_RELEASE_TABLET | Freq: Every day | ORAL | 0 refills | Status: DC
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
## 2022-06-29 NOTE — Progress Notes (Signed)
Mobility Specialist Progress Note:   06/29/22 1000  Mobility  Activity Ambulated independently in hallway  Level of Assistance Modified independent, requires aide device or extra time  Assistive Device Front wheel walker  Distance Ambulated (ft) 100 ft  Activity Response Tolerated fair  $Mobility charge 1 Mobility   Pt eager for mobility session. No physical assistance required. Audible wheezing throughout, however SpO2 99% on RA once available reading. Pt left sitting EOB with all needs met.     Acute Rehab Secure Chat or Office Phone: [phone]  

## 2022-06-29 NOTE — Progress Notes (Signed)
Patient discharged home with daughter. Alert and oriented. No complaints at this time. IV removed from Right forearm and tele discontinued. Rolator delivered to patient and she took it home. Patient also got medications from TOC. Discharge instruction provided to patient, including instruction on new medications and follow up appointments. Patient has no questions at this time.
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
## 2022-07-01 LAB — CULTURE, BLOOD (ROUTINE X 2)
Culture: NO GROWTH
Culture: NO GROWTH
Special Requests: ADEQUATE
Special Requests: ADEQUATE

## 2022-07-05 ENCOUNTER — Ambulatory Visit: Payer: Self-pay

## 2022-08-06 ENCOUNTER — Other Ambulatory Visit: Payer: Self-pay | Admitting: Lab

## 2022-08-07 ENCOUNTER — Inpatient Hospital Stay: Payer: Medicare Other | Attending: Oncology

## 2022-08-07 ENCOUNTER — Inpatient Hospital Stay: Payer: Medicare Other | Admitting: Hematology and Oncology

## 2022-08-07 ENCOUNTER — Inpatient Hospital Stay: Payer: Medicare Other

## 2022-08-08 ENCOUNTER — Encounter: Payer: Self-pay | Admitting: *Deleted

## 2022-08-08 ENCOUNTER — Telehealth: Payer: Self-pay | Admitting: *Deleted

## 2022-08-08 NOTE — Telephone Encounter (Signed)
This RN was informed pt was a no show for schedule appt yesterday- of note pt was contacted by scheduling per prior missed appt and this appt was made per that phone discussion  Per MD request this RN mailed letter to patient regarding need to call this office to reschedule- see documentation under letters.

## 2022-08-17 ENCOUNTER — Telehealth: Payer: Self-pay

## 2022-08-17 NOTE — Patient Outreach (Addendum)
  Care Coordination   08/17/2022 Name: Kimberly [name] MRN: [id number] DOB: 02/17/1971   Care Coordination Outreach Attempts:  An unsuccessful telephone outreach was attempted today to offer the patient information about available care coordination services as a benefit of their health plan.   Follow Up Plan:  Additional outreach attempts will be made to offer the patient care coordination information and services.   Encounter Outcome:  Pt. Request to Call Back- Then Hung up Care Coordination Interventions Activated:  No   Care Coordination Interventions:  No, not indicated    Jone Baseman, RN, MSN [place] [facility] Care Management Coordinator Direct Line [phone]

## 2022-08-20 ENCOUNTER — Encounter: Payer: Self-pay | Admitting: Gastroenterology

## 2022-08-30 ENCOUNTER — Encounter: Payer: Self-pay | Admitting: Hematology and Oncology

## 2022-08-30 ENCOUNTER — Inpatient Hospital Stay: Payer: Medicare Other

## 2022-08-30 ENCOUNTER — Inpatient Hospital Stay: Payer: Medicare Other | Attending: Oncology

## 2022-08-30 ENCOUNTER — Other Ambulatory Visit: Payer: Self-pay

## 2022-08-30 ENCOUNTER — Inpatient Hospital Stay (HOSPITAL_BASED_OUTPATIENT_CLINIC_OR_DEPARTMENT_OTHER): Payer: Medicare Other | Admitting: Hematology and Oncology

## 2022-08-30 ENCOUNTER — Other Ambulatory Visit: Payer: Self-pay | Admitting: *Deleted

## 2022-08-30 DIAGNOSIS — C50912 Malignant neoplasm of unspecified site of left female breast: Secondary | ICD-10-CM

## 2022-08-30 DIAGNOSIS — N951 Menopausal and female climacteric states: Secondary | ICD-10-CM

## 2022-08-30 DIAGNOSIS — Z5111 Encounter for antineoplastic chemotherapy: Secondary | ICD-10-CM | POA: Insufficient documentation

## 2022-08-30 DIAGNOSIS — C792 Secondary malignant neoplasm of skin: Secondary | ICD-10-CM | POA: Insufficient documentation

## 2022-08-30 DIAGNOSIS — D509 Iron deficiency anemia, unspecified: Secondary | ICD-10-CM | POA: Diagnosis not present

## 2022-08-30 DIAGNOSIS — Z170 Estrogen receptor positive status [ER+]: Secondary | ICD-10-CM | POA: Diagnosis not present

## 2022-08-30 DIAGNOSIS — C50412 Malignant neoplasm of upper-outer quadrant of left female breast: Secondary | ICD-10-CM | POA: Insufficient documentation

## 2022-08-30 LAB — CBC WITH DIFFERENTIAL (CANCER CENTER ONLY)
Abs Immature Granulocytes: 0.05 10*3/uL (ref 0.00–0.07)
Basophils Absolute: 0 10*3/uL (ref 0.0–0.1)
Basophils Relative: 1 %
Eosinophils Absolute: 0.1 10*3/uL (ref 0.0–0.5)
Eosinophils Relative: 2 %
HCT: 33.6 % — ABNORMAL LOW (ref 36.0–46.0)
Hemoglobin: 10.9 g/dL — ABNORMAL LOW (ref 12.0–15.0)
Immature Granulocytes: 1 %
Lymphocytes Relative: 38 %
Lymphs Abs: 2.4 10*3/uL (ref 0.7–4.0)
MCH: 25.7 pg — ABNORMAL LOW (ref 26.0–34.0)
MCHC: 32.4 g/dL (ref 30.0–36.0)
MCV: 79.2 fL — ABNORMAL LOW (ref 80.0–100.0)
Monocytes Absolute: 0.7 10*3/uL (ref 0.1–1.0)
Monocytes Relative: 11 %
Neutro Abs: 2.9 10*3/uL (ref 1.7–7.7)
Neutrophils Relative %: 47 %
Platelet Count: 266 10*3/uL (ref 150–400)
RBC: 4.24 MIL/uL (ref 3.87–5.11)
RDW: 19.9 % — ABNORMAL HIGH (ref 11.5–15.5)
WBC Count: 6.2 10*3/uL (ref 4.0–10.5)
nRBC: 0 % (ref 0.0–0.2)

## 2022-08-30 LAB — CMP (CANCER CENTER ONLY)
ALT: 11 U/L (ref 0–44)
AST: 17 U/L (ref 15–41)
Albumin: 4.2 g/dL (ref 3.5–5.0)
Alkaline Phosphatase: 76 U/L (ref 38–126)
Anion gap: 8 (ref 5–15)
BUN: 6 mg/dL (ref 6–20)
CO2: 26 mmol/L (ref 22–32)
Calcium: 9.4 mg/dL (ref 8.9–10.3)
Chloride: 103 mmol/L (ref 98–111)
Creatinine: 1.07 mg/dL — ABNORMAL HIGH (ref 0.44–1.00)
GFR, Estimated: >60 mL/min (ref >60)
Glucose, Bld: 109 mg/dL — ABNORMAL HIGH (ref 70–99)
Potassium: 4 mmol/L (ref 3.5–5.1)
Sodium: 137 mmol/L (ref 135–145)
Total Bilirubin: 0.3 mg/dL (ref 0.3–1.2)
Total Protein: 7.8 g/dL (ref 6.5–8.1)

## 2022-08-30 LAB — FERRITIN: Ferritin: 5 ng/mL — ABNORMAL LOW (ref 11–307)

## 2022-08-30 LAB — IRON AND IRON BINDING CAPACITY (CC-WL,HP ONLY)
Iron: 28 ug/dL (ref 28–170)
Saturation Ratios: 5 % — ABNORMAL LOW (ref 10.4–31.8)
TIBC: 531 ug/dL — ABNORMAL HIGH (ref 250–450)
UIBC: 503 ug/dL — ABNORMAL HIGH (ref 148–442)

## 2022-08-30 MED ORDER — ONDANSETRON HCL 8 MG PO TABS: ORAL_TABLET | ORAL | 2 refills | Status: DC

## 2022-08-30 MED ORDER — FULVESTRANT 250 MG/5ML IM SOSY
500.0000 mg | PREFILLED_SYRINGE | Freq: Once | INTRAMUSCULAR | Status: AC
  Administered 2022-08-30: 500 mg via INTRAMUSCULAR
  Filled 2022-08-30: qty 10
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
## 2022-08-31 ENCOUNTER — Other Ambulatory Visit: Payer: Self-pay

## 2022-08-31 ENCOUNTER — Ambulatory Visit (INDEPENDENT_AMBULATORY_CARE_PROVIDER_SITE_OTHER): Payer: Medicare Other | Admitting: Family Medicine

## 2022-08-31 ENCOUNTER — Telehealth: Payer: Self-pay | Admitting: Hematology and Oncology

## 2022-08-31 ENCOUNTER — Other Ambulatory Visit: Payer: Self-pay | Admitting: Hematology and Oncology

## 2022-08-31 VITALS — BP 149/95 | HR 109 | Wt 282.0 lb

## 2022-08-31 DIAGNOSIS — L03032 Cellulitis of left toe: Secondary | ICD-10-CM | POA: Diagnosis not present

## 2022-08-31 DIAGNOSIS — L600 Ingrowing nail: Secondary | ICD-10-CM | POA: Diagnosis not present

## 2022-08-31 LAB — LUTEINIZING HORMONE: LH: 40.4 m[IU]/mL

## 2022-08-31 LAB — FOLLICLE STIMULATING HORMONE: FSH: 94.2 m[IU]/mL

## 2022-08-31 MED ORDER — MUPIROCIN 2 % EX OINT: 1.0000 | TOPICAL_OINTMENT | Freq: Three times a day (TID) | CUTANEOUS | 0 refills | Status: AC

## 2022-08-31 MED ORDER — MUPIROCIN 2 % EX OINT: 1.0000 | TOPICAL_OINTMENT | Freq: Three times a day (TID) | CUTANEOUS | 0 refills | Status: DC

## 2022-08-31 NOTE — Telephone Encounter (Signed)
Contacted patient to scheduled appointments. Patient is aware of appointments that are scheduled.   

## 2022-08-31 NOTE — Progress Notes (Unsigned)
    SUBJECTIVE:   CHIEF COMPLAINT / HPI:   Patient presents for concern for infected toe nail. States she hs an ingrown toenail which has been there for a week. Had her right big toenail removed when she was 17. Has been painful. Has been experiencing fever and chills for the past week.  Denies digging at her nail   Expresses frustration about her nausea due to her chemotherapy   PERTINENT  PMH / PSH: Reviewed   OBJECTIVE:   BP (!) 149/95   Pulse (!) 109   Wt 282 lb (127.9 kg)   SpO2 100%   BMI 42.88 kg/m    Physical exam General: well appearing, NAD Cardiovascular: RRR, no murmurs Lungs: CTAB. Normal WOB Abdomen: soft, non-distended, non-tender Skin: warm, dry. No edema  ASSESSMENT/PLAN:   No problem-specific Assessment & Plan notes found for this encounter.     [place]

## 2022-08-31 NOTE — Progress Notes (Unsigned)
Venofer plan placed because of severe IDA.  Kimberly [name]

## 2022-08-31 NOTE — Patient Instructions (Addendum)
It was great seeing you today!  You came in for concern for ingrown toenail and infection. We are treating for potential infection with topical antibiotics to use 3 times a day for 5 days.   We will schedule you for derm clinic for removal of the toenail and will call you with that time, but if you need to be seen earlier than that for any new issues we're happy to fit you in, just give [name] a call!  Feel free to call with any questions or concerns at any time, at [phone].   Take care,  Dr. Shary Key [facility]

## 2022-09-01 DIAGNOSIS — L03032 Cellulitis of left toe: Secondary | ICD-10-CM | POA: Insufficient documentation

## 2022-09-01 NOTE — Assessment & Plan Note (Signed)
Patient presents with 1 week of L first toenail pain. Also with intermittent fevers and chills. Physical exam significant for left first toe nail thickened and discolored with surrounding skin erythematous and tender to palpation Likely onychomycosis as well as paronychia and onychocryptosis. Treating the infection with Bactroban TID x 5 days and scheduled to return to [facility] clinic for partial removal.

## 2022-09-02 LAB — ESTRADIOL, ULTRA SENS: Estradiol, Sensitive: <2.5 pg/mL

## 2022-09-04 ENCOUNTER — Telehealth: Payer: Self-pay | Admitting: Hematology and Oncology

## 2022-09-04 NOTE — Telephone Encounter (Signed)
Called patient to inform of new upcoming appointments

## 2022-09-06 ENCOUNTER — Encounter: Payer: Self-pay | Admitting: Hematology and Oncology

## 2022-09-06 ENCOUNTER — Other Ambulatory Visit: Payer: Self-pay | Admitting: Hematology and Oncology

## 2022-09-06 ENCOUNTER — Telehealth: Payer: Self-pay

## 2022-09-06 ENCOUNTER — Other Ambulatory Visit: Payer: Self-pay

## 2022-09-06 ENCOUNTER — Inpatient Hospital Stay (HOSPITAL_BASED_OUTPATIENT_CLINIC_OR_DEPARTMENT_OTHER): Payer: Medicare Other | Admitting: Hematology and Oncology

## 2022-09-06 VITALS — BP 163/87 | HR 99 | Temp 97.5°F | Resp 18 | Wt 280.2 lb

## 2022-09-06 DIAGNOSIS — C50412 Malignant neoplasm of upper-outer quadrant of left female breast: Secondary | ICD-10-CM | POA: Diagnosis not present

## 2022-09-06 DIAGNOSIS — C50912 Malignant neoplasm of unspecified site of left female breast: Secondary | ICD-10-CM

## 2022-09-06 DIAGNOSIS — N951 Menopausal and female climacteric states: Secondary | ICD-10-CM

## 2022-09-06 DIAGNOSIS — Z170 Estrogen receptor positive status [ER+]: Secondary | ICD-10-CM | POA: Diagnosis not present

## 2022-09-06 DIAGNOSIS — D509 Iron deficiency anemia, unspecified: Secondary | ICD-10-CM

## 2022-09-06 DIAGNOSIS — C792 Secondary malignant neoplasm of skin: Secondary | ICD-10-CM | POA: Diagnosis not present

## 2022-09-06 DIAGNOSIS — Z5111 Encounter for antineoplastic chemotherapy: Secondary | ICD-10-CM | POA: Diagnosis not present

## 2022-09-06 MED ORDER — GABAPENTIN 300 MG PO CAPS: 300.0000 mg | ORAL_CAPSULE | Freq: Every day | ORAL | 3 refills | Status: DC
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
## 2022-09-06 NOTE — Patient Instructions (Signed)
Visit Information  Thank you for taking time to visit with me today. Please don't hesitate to contact me if I can be of assistance to you.   Following are the goals we discussed today:   Goals Addressed             This Visit's Progress    COMPLETED: Care Coordination Activities-No follow up required       Care Coordination Interventions: Advised patient to schedule annual wellness visit. THN support. Patient decline           If you are experiencing a Mental Health or [place] or need someone to talk to, please    The patient verbalized understanding of instructions, educational materials, and care plan provided today and DECLINED offer to receive copy of patient instructions, educational materials, and care plan.   No further follow up required: decline  Jone Baseman, RN, MSN [facility] Care Management Coordinator Direct Line [phone]

## 2022-09-06 NOTE — Patient Outreach (Signed)
  Care Coordination   Initial Visit Note   09/06/2022 Name: Kimberly [name] MRN: [id number] DOB: [birth year]  Kimberly [name] is a 51 y.o. year old female who sees Arlyce Dice, MD for primary care. I spoke with  [name] by phone today.  What matters to the patients health and wellness today?  none    Goals Addressed             This Visit's Progress    COMPLETED: Care Coordination Activities-No follow up required       Care Coordination Interventions: Advised patient to schedule annual wellness visit. THN support. Patient decline          SDOH assessments and interventions completed:  Yes  SDOH Interventions Today    Flowsheet Row Most Recent Value  SDOH Interventions   Utilities Interventions Intervention Not Indicated        Care Coordination Interventions Activated:  Yes  Care Coordination Interventions:  Yes, provided   Follow up plan: No further intervention required.   Encounter Outcome:  Pt. Visit Completed   Jone Baseman, RN, MSN [facility] Care Management Coordinator Direct Line [phone]

## 2022-09-07 MED FILL — Iron Sucrose Inj 20 MG/ML (Fe Equiv): INTRAVENOUS | Qty: 10 | Status: AC

## 2022-09-08 ENCOUNTER — Inpatient Hospital Stay: Payer: Medicare Other

## 2022-09-08 VITALS — BP 143/82 | HR 90 | Temp 97.8°F | Resp 18

## 2022-09-08 DIAGNOSIS — Z5111 Encounter for antineoplastic chemotherapy: Secondary | ICD-10-CM | POA: Diagnosis not present

## 2022-09-08 DIAGNOSIS — C792 Secondary malignant neoplasm of skin: Secondary | ICD-10-CM | POA: Diagnosis not present

## 2022-09-08 DIAGNOSIS — C50412 Malignant neoplasm of upper-outer quadrant of left female breast: Secondary | ICD-10-CM | POA: Diagnosis not present

## 2022-09-08 DIAGNOSIS — D509 Iron deficiency anemia, unspecified: Secondary | ICD-10-CM | POA: Diagnosis not present

## 2022-09-08 DIAGNOSIS — Z95828 Presence of other vascular implants and grafts: Secondary | ICD-10-CM

## 2022-09-08 DIAGNOSIS — Z170 Estrogen receptor positive status [ER+]: Secondary | ICD-10-CM | POA: Diagnosis not present

## 2022-09-08 MED ORDER — SODIUM CHLORIDE 0.9 % IV SOLN: Freq: Once | INTRAVENOUS | Status: AC

## 2022-09-08 MED ORDER — HEPARIN SOD (PORK) LOCK FLUSH 100 UNIT/ML IV SOLN
500.0000 [IU] | Freq: Once | INTRAVENOUS | Status: AC
  Administered 2022-09-08: 500 [IU] via INTRAVENOUS

## 2022-09-08 MED ORDER — SODIUM CHLORIDE 0.9 % IV SOLN
200.0000 mg | Freq: Once | INTRAVENOUS | Status: AC
  Administered 2022-09-08: 200 mg via INTRAVENOUS
  Filled 2022-09-08: qty 10

## 2022-09-08 MED ORDER — SODIUM CHLORIDE 0.9% FLUSH
10.0000 mL | INTRAVENOUS | Status: DC | PRN
  Administered 2022-09-08: 10 mL via INTRAVENOUS

## 2022-09-08 NOTE — Patient Instructions (Signed)

## 2022-09-08 NOTE — Progress Notes (Signed)
Pt declined to stay for 30-minute post observation for iron infusion today.

## 2022-09-12 ENCOUNTER — Inpatient Hospital Stay: Payer: Medicare Other

## 2022-09-14 MED FILL — Iron Sucrose Inj 20 MG/ML (Fe Equiv): INTRAVENOUS | Qty: 10 | Status: AC

## 2022-09-15 ENCOUNTER — Telehealth: Payer: Self-pay | Admitting: *Deleted

## 2022-09-15 ENCOUNTER — Inpatient Hospital Stay: Payer: Medicare Other

## 2022-09-15 NOTE — Telephone Encounter (Signed)
Pt was NO  SHOW for Iron infusion  09/15/22.   Called pt on cell phone and left message on voice mail asking pt to call office on Monday to reschedule missed appt.

## 2022-09-17 ENCOUNTER — Inpatient Hospital Stay: Payer: Medicare Other

## 2022-09-19 ENCOUNTER — Telehealth: Payer: Self-pay

## 2022-09-19 ENCOUNTER — Encounter: Payer: Self-pay | Admitting: Adult Health

## 2022-09-19 ENCOUNTER — Inpatient Hospital Stay: Payer: Medicare Other
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
## 2022-09-19 NOTE — Telephone Encounter (Signed)
error 

## 2022-09-20 ENCOUNTER — Ambulatory Visit: Payer: Medicare Other | Admitting: Family Medicine

## 2022-09-21 MED FILL — Iron Sucrose Inj 20 MG/ML (Fe Equiv): INTRAVENOUS | Qty: 10 | Status: AC

## 2022-09-22 ENCOUNTER — Inpatient Hospital Stay: Payer: Medicare Other | Attending: Oncology

## 2022-09-22 DIAGNOSIS — Z5111 Encounter for antineoplastic chemotherapy: Secondary | ICD-10-CM | POA: Insufficient documentation

## 2022-09-22 DIAGNOSIS — D509 Iron deficiency anemia, unspecified: Secondary | ICD-10-CM | POA: Insufficient documentation

## 2022-09-22 DIAGNOSIS — C792 Secondary malignant neoplasm of skin: Secondary | ICD-10-CM | POA: Insufficient documentation

## 2022-09-22 DIAGNOSIS — C50412 Malignant neoplasm of upper-outer quadrant of left female breast: Secondary | ICD-10-CM | POA: Insufficient documentation

## 2022-09-22 DIAGNOSIS — Z79811 Long term (current) use of aromatase inhibitors: Secondary | ICD-10-CM | POA: Insufficient documentation

## 2022-09-22 DIAGNOSIS — Z170 Estrogen receptor positive status [ER+]: Secondary | ICD-10-CM | POA: Insufficient documentation

## 2022-09-25 ENCOUNTER — Inpatient Hospital Stay: Payer: Medicare Other

## 2022-09-25 ENCOUNTER — Inpatient Hospital Stay: Payer: Medicare Other | Admitting: Hematology and Oncology

## 2022-09-26 ENCOUNTER — Telehealth: Payer: Self-pay | Admitting: Hematology and Oncology

## 2022-09-26 ENCOUNTER — Telehealth: Payer: Self-pay | Admitting: Adult Health

## 2022-09-26 NOTE — Telephone Encounter (Signed)
Contacted patient to scheduled appointments. Patient is aware of appointments that are scheduled.   

## 2022-09-27 ENCOUNTER — Inpatient Hospital Stay: Payer: Medicare Other

## 2022-09-27 ENCOUNTER — Encounter: Payer: Self-pay | Admitting: Adult Health

## 2022-09-27 ENCOUNTER — Inpatient Hospital Stay (HOSPITAL_BASED_OUTPATIENT_CLINIC_OR_DEPARTMENT_OTHER): Payer: Medicare Other | Admitting: Adult Health

## 2022-09-27 ENCOUNTER — Other Ambulatory Visit: Payer: Self-pay

## 2022-09-27 VITALS — BP 94/78 | HR 100 | Temp 97.9°F | Resp 18 | Ht 68.0 in | Wt 290.9 lb

## 2022-09-27 DIAGNOSIS — C50912 Malignant neoplasm of unspecified site of left female breast: Secondary | ICD-10-CM

## 2022-09-27 DIAGNOSIS — Z5111 Encounter for antineoplastic chemotherapy: Secondary | ICD-10-CM | POA: Diagnosis not present

## 2022-09-27 DIAGNOSIS — D509 Iron deficiency anemia, unspecified: Secondary | ICD-10-CM

## 2022-09-27 DIAGNOSIS — C50412 Malignant neoplasm of upper-outer quadrant of left female breast: Secondary | ICD-10-CM | POA: Diagnosis not present

## 2022-09-27 DIAGNOSIS — C792 Secondary malignant neoplasm of skin: Secondary | ICD-10-CM | POA: Diagnosis not present

## 2022-09-27 DIAGNOSIS — Z79811 Long term (current) use of aromatase inhibitors: Secondary | ICD-10-CM | POA: Diagnosis not present

## 2022-09-27 DIAGNOSIS — Z170 Estrogen receptor positive status [ER+]: Secondary | ICD-10-CM | POA: Diagnosis not present

## 2022-09-27 LAB — CBC WITH DIFFERENTIAL (CANCER CENTER ONLY)
Abs Immature Granulocytes: 0.03 10*3/uL (ref 0.00–0.07)
Basophils Absolute: 0.1 10*3/uL (ref 0.0–0.1)
Basophils Relative: 1 %
Eosinophils Absolute: 0.2 10*3/uL (ref 0.0–0.5)
Eosinophils Relative: 2 %
HCT: 31.7 % — ABNORMAL LOW (ref 36.0–46.0)
Hemoglobin: 9.9 g/dL — ABNORMAL LOW (ref 12.0–15.0)
Immature Granulocytes: 0 %
Lymphocytes Relative: 27 %
Lymphs Abs: 1.9 10*3/uL (ref 0.7–4.0)
MCH: 24.9 pg — ABNORMAL LOW (ref 26.0–34.0)
MCHC: 31.2 g/dL (ref 30.0–36.0)
MCV: 79.8 fL — ABNORMAL LOW (ref 80.0–100.0)
Monocytes Absolute: 0.6 10*3/uL (ref 0.1–1.0)
Monocytes Relative: 8 %
Neutro Abs: 4.3 10*3/uL (ref 1.7–7.7)
Neutrophils Relative %: 62 %
Platelet Count: 272 10*3/uL (ref 150–400)
RBC: 3.97 MIL/uL (ref 3.87–5.11)
RDW: 19.5 % — ABNORMAL HIGH (ref 11.5–15.5)
WBC Count: 7 10*3/uL (ref 4.0–10.5)
nRBC: 0 % (ref 0.0–0.2)

## 2022-09-27 LAB — CMP (CANCER CENTER ONLY)
ALT: 6 U/L (ref 0–44)
AST: 12 U/L — ABNORMAL LOW (ref 15–41)
Albumin: 4.2 g/dL (ref 3.5–5.0)
Alkaline Phosphatase: 68 U/L (ref 38–126)
Anion gap: 8 (ref 5–15)
BUN: 8 mg/dL (ref 6–20)
CO2: 27 mmol/L (ref 22–32)
Calcium: 9.7 mg/dL (ref 8.9–10.3)
Chloride: 104 mmol/L (ref 98–111)
Creatinine: 0.92 mg/dL (ref 0.44–1.00)
GFR, Estimated: >60 mL/min (ref >60)
Glucose, Bld: 122 mg/dL — ABNORMAL HIGH (ref 70–99)
Potassium: 3.8 mmol/L (ref 3.5–5.1)
Sodium: 139 mmol/L (ref 135–145)
Total Bilirubin: 0.3 mg/dL (ref 0.3–1.2)
Total Protein: 7.5 g/dL (ref 6.5–8.1)

## 2022-09-27 MED ORDER — FULVESTRANT 250 MG/5ML IM SOSY
500.0000 mg | PREFILLED_SYRINGE | Freq: Once | INTRAMUSCULAR | Status: AC
  Administered 2022-09-27: 500 mg via INTRAMUSCULAR
  Filled 2022-09-27: qty 10
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
## 2022-09-27 NOTE — Assessment & Plan Note (Signed)
Kimberly [name] is a 51 year old woman with recurrent left-sided breast cancer who is status post mastectomy and radiation.    #1 recurrent breast cancer: She will continue on Faslodex, Verzenio, and Anastrozole.  Her hot flashes are improving with Gabapentin.    #2 Back Pain, intermittent lower chest wall/upper abdomen discomfort--I ordered restaging scans with bone scan and CT chest/abd/pelvis to further evaluate.    #3 Iron deficiency-I requested scheduling get her rescheduled for her venofer so she can go ahead and receive her four remaining doses.    #4 Transportation difficulty: I placed a social work referral today so they can evaluate and assist with any resources for [employer].    We will see her back for her iron infusions weekly and on 10/25/2022 for f/u with Dr. Chryl Heck.

## 2022-09-28 ENCOUNTER — Telehealth: Payer: Self-pay | Admitting: Adult Health

## 2022-09-28 ENCOUNTER — Inpatient Hospital Stay: Payer: Medicare Other | Admitting: Licensed Clinical Social Worker

## 2022-09-28 NOTE — Progress Notes (Signed)
[facility] CSW Progress Note  Clinical Education officer, museum contacted patient by phone to follow-up on resource needs per NP.  Pt reports finances being tight again, especially with food. Pt/children do not currently receive SNAP but the kids do receive other benefits. Encouraged pt to apply for SNAP.  CSW also sent information on local food pantries and will update pt with any Christmas meals that may be available in the community per pt request.  Pt has already received assistance from cancer foundations. She will be eligible to apply for [employer] again in January which CSW will assist with submitting.  Encouraged pt to reach out with any other needs.    Kimberly [name] E Kimberly Stith, LCSW

## 2022-09-28 NOTE — Telephone Encounter (Signed)
Scheduled appointment per 12/7 los. Patient is aware. 

## 2022-09-29 ENCOUNTER — Inpatient Hospital Stay: Payer: Medicare Other

## 2022-09-29 VITALS — BP 140/96 | HR 89 | Temp 97.7°F | Resp 18

## 2022-09-29 DIAGNOSIS — D509 Iron deficiency anemia, unspecified: Secondary | ICD-10-CM

## 2022-09-29 DIAGNOSIS — C50412 Malignant neoplasm of upper-outer quadrant of left female breast: Secondary | ICD-10-CM | POA: Diagnosis not present

## 2022-09-29 DIAGNOSIS — Z79811 Long term (current) use of aromatase inhibitors: Secondary | ICD-10-CM | POA: Diagnosis not present

## 2022-09-29 DIAGNOSIS — C792 Secondary malignant neoplasm of skin: Secondary | ICD-10-CM | POA: Diagnosis not present

## 2022-09-29 DIAGNOSIS — Z170 Estrogen receptor positive status [ER+]: Secondary | ICD-10-CM | POA: Diagnosis not present

## 2022-09-29 DIAGNOSIS — Z5111 Encounter for antineoplastic chemotherapy: Secondary | ICD-10-CM | POA: Diagnosis not present

## 2022-09-29 MED ORDER — SODIUM CHLORIDE 0.9 % IV SOLN
200.0000 mg | Freq: Once | INTRAVENOUS | Status: AC
  Administered 2022-09-29: 200 mg via INTRAVENOUS
  Filled 2022-09-29: qty 200

## 2022-09-29 MED ORDER — SODIUM CHLORIDE 0.9 % IV SOLN: Freq: Once | INTRAVENOUS | Status: AC

## 2022-09-29 NOTE — Progress Notes (Signed)
Pt declined to stay for 30 minute post-observation period. VSS, ambulatory to the lobby.

## 2022-09-29 NOTE — Patient Instructions (Signed)

## 2022-10-03 ENCOUNTER — Telehealth: Payer: Self-pay | Admitting: Hematology and Oncology

## 2022-10-03 NOTE — Telephone Encounter (Signed)
Patient called to confirm upcoming appointments. Patient notified.

## 2022-10-04 ENCOUNTER — Ambulatory Visit: Payer: Medicare Other

## 2022-10-04 ENCOUNTER — Other Ambulatory Visit: Payer: Self-pay | Admitting: *Deleted

## 2022-10-04 MED ORDER — ANASTROZOLE 1 MG PO TABS: 1.0000 mg | ORAL_TABLET | Freq: Every day | ORAL | 1 refills | Status: DC

## 2022-10-05 ENCOUNTER — Ambulatory Visit (HOSPITAL_COMMUNITY): Admission: RE | Admit: 2022-10-05 | Payer: Medicare Other | Source: Ambulatory Visit

## 2022-10-05 ENCOUNTER — Ambulatory Visit (HOSPITAL_COMMUNITY): Payer: Medicare Other

## 2022-10-06 ENCOUNTER — Inpatient Hospital Stay: Payer: Medicare Other

## 2022-10-06 VITALS — BP 130/69 | HR 97 | Temp 97.8°F | Resp 18 | Ht 68.0 in

## 2022-10-06 DIAGNOSIS — Z170 Estrogen receptor positive status [ER+]: Secondary | ICD-10-CM | POA: Diagnosis not present

## 2022-10-06 DIAGNOSIS — Z79811 Long term (current) use of aromatase inhibitors: Secondary | ICD-10-CM | POA: Diagnosis not present

## 2022-10-06 DIAGNOSIS — D509 Iron deficiency anemia, unspecified: Secondary | ICD-10-CM

## 2022-10-06 DIAGNOSIS — C792 Secondary malignant neoplasm of skin: Secondary | ICD-10-CM | POA: Diagnosis not present

## 2022-10-06 DIAGNOSIS — Z5111 Encounter for antineoplastic chemotherapy: Secondary | ICD-10-CM | POA: Diagnosis not present

## 2022-10-06 DIAGNOSIS — C50412 Malignant neoplasm of upper-outer quadrant of left female breast: Secondary | ICD-10-CM | POA: Diagnosis not present

## 2022-10-06 MED ORDER — HEPARIN SOD (PORK) LOCK FLUSH 100 UNIT/ML IV SOLN: 250.0000 [IU] | Freq: Once | INTRAVENOUS | Status: DC | PRN

## 2022-10-06 MED ORDER — SODIUM CHLORIDE 0.9% FLUSH
10.0000 mL | Freq: Once | INTRAVENOUS | Status: AC | PRN
  Administered 2022-10-06: 10 mL

## 2022-10-06 MED ORDER — SODIUM CHLORIDE 0.9 % IV SOLN: Freq: Once | INTRAVENOUS | Status: AC

## 2022-10-06 MED ORDER — SODIUM CHLORIDE 0.9 % IV SOLN
200.0000 mg | Freq: Once | INTRAVENOUS | Status: AC
  Administered 2022-10-06: 200 mg via INTRAVENOUS
  Filled 2022-10-06: qty 200

## 2022-10-06 MED ORDER — HEPARIN SOD (PORK) LOCK FLUSH 100 UNIT/ML IV SOLN
500.0000 [IU] | Freq: Once | INTRAVENOUS | Status: AC | PRN
  Administered 2022-10-06: 500 [IU]

## 2022-10-06 NOTE — Patient Instructions (Signed)

## 2022-10-08 ENCOUNTER — Ambulatory Visit (HOSPITAL_COMMUNITY)
Admission: RE | Admit: 2022-10-08 | Discharge: 2022-10-08 | Disposition: A | Discharge status: Home / Self Care | Payer: Medicare Other | Source: Ambulatory Visit | Attending: Adult Health | Admitting: Adult Health

## 2022-10-08 DIAGNOSIS — C50912 Malignant neoplasm of unspecified site of left female breast: Secondary | ICD-10-CM | POA: Diagnosis not present

## 2022-10-08 DIAGNOSIS — J9811 Atelectasis: Secondary | ICD-10-CM | POA: Diagnosis not present

## 2022-10-08 DIAGNOSIS — J8410 Pulmonary fibrosis, unspecified: Secondary | ICD-10-CM | POA: Diagnosis not present

## 2022-10-08 MED ORDER — HEPARIN SOD (PORK) LOCK FLUSH 100 UNIT/ML IV SOLN
INTRAVENOUS | Status: AC
  Administered 2022-10-08: 500 [IU]
  Filled 2022-10-08: qty 5

## 2022-10-08 MED ORDER — SODIUM CHLORIDE (PF) 0.9 % IJ SOLN
INTRAMUSCULAR | Status: AC
  Filled 2022-10-08: qty 50

## 2022-10-08 MED ORDER — IOHEXOL 300 MG/ML  SOLN
100.0000 mL | Freq: Once | INTRAMUSCULAR | Status: AC | PRN
  Administered 2022-10-08: 100 mL via INTRAVENOUS

## 2022-10-13 ENCOUNTER — Inpatient Hospital Stay: Payer: Medicare Other

## 2022-10-13 VITALS — BP 133/70 | HR 76 | Temp 98.9°F | Resp 18

## 2022-10-13 DIAGNOSIS — Z5111 Encounter for antineoplastic chemotherapy: Secondary | ICD-10-CM | POA: Diagnosis not present

## 2022-10-13 DIAGNOSIS — D509 Iron deficiency anemia, unspecified: Secondary | ICD-10-CM | POA: Diagnosis not present

## 2022-10-13 DIAGNOSIS — Z170 Estrogen receptor positive status [ER+]: Secondary | ICD-10-CM | POA: Diagnosis not present

## 2022-10-13 DIAGNOSIS — C50412 Malignant neoplasm of upper-outer quadrant of left female breast: Secondary | ICD-10-CM | POA: Diagnosis not present

## 2022-10-13 DIAGNOSIS — C792 Secondary malignant neoplasm of skin: Secondary | ICD-10-CM | POA: Diagnosis not present

## 2022-10-13 DIAGNOSIS — Z79811 Long term (current) use of aromatase inhibitors: Secondary | ICD-10-CM | POA: Diagnosis not present

## 2022-10-13 MED ORDER — SODIUM CHLORIDE 0.9 % IV SOLN
200.0000 mg | Freq: Once | INTRAVENOUS | Status: AC
  Administered 2022-10-13: 200 mg via INTRAVENOUS
  Filled 2022-10-13: qty 200

## 2022-10-13 MED ORDER — SODIUM CHLORIDE 0.9 % IV SOLN: Freq: Once | INTRAVENOUS | Status: AC

## 2022-10-13 MED ORDER — SODIUM CHLORIDE 0.9% FLUSH
10.0000 mL | Freq: Once | INTRAVENOUS | Status: AC | PRN
  Administered 2022-10-13: 10 mL

## 2022-10-13 MED ORDER — HEPARIN SOD (PORK) LOCK FLUSH 100 UNIT/ML IV SOLN
500.0000 [IU] | Freq: Once | INTRAVENOUS | Status: AC | PRN
  Administered 2022-10-13: 500 [IU]

## 2022-10-13 NOTE — Patient Instructions (Signed)

## 2022-10-20 ENCOUNTER — Inpatient Hospital Stay: Payer: Medicare Other

## 2022-10-20 VITALS — BP 127/81 | HR 95 | Temp 98.2°F | Resp 18

## 2022-10-20 DIAGNOSIS — Z5111 Encounter for antineoplastic chemotherapy: Secondary | ICD-10-CM | POA: Diagnosis not present

## 2022-10-20 DIAGNOSIS — C792 Secondary malignant neoplasm of skin: Secondary | ICD-10-CM | POA: Diagnosis not present

## 2022-10-20 DIAGNOSIS — Z79811 Long term (current) use of aromatase inhibitors: Secondary | ICD-10-CM | POA: Diagnosis not present

## 2022-10-20 DIAGNOSIS — D509 Iron deficiency anemia, unspecified: Secondary | ICD-10-CM | POA: Diagnosis not present

## 2022-10-20 DIAGNOSIS — C50412 Malignant neoplasm of upper-outer quadrant of left female breast: Secondary | ICD-10-CM | POA: Diagnosis not present

## 2022-10-20 DIAGNOSIS — Z170 Estrogen receptor positive status [ER+]: Secondary | ICD-10-CM | POA: Diagnosis not present

## 2022-10-20 MED ORDER — SODIUM CHLORIDE 0.9 % IV SOLN: Freq: Once | INTRAVENOUS | Status: AC

## 2022-10-20 MED ORDER — SODIUM CHLORIDE 0.9 % IV SOLN
200.0000 mg | Freq: Once | INTRAVENOUS | Status: AC
  Administered 2022-10-20: 200 mg via INTRAVENOUS
  Filled 2022-10-20: qty 200

## 2022-10-20 MED ORDER — HEPARIN SOD (PORK) LOCK FLUSH 100 UNIT/ML IV SOLN
500.0000 [IU] | Freq: Once | INTRAVENOUS | Status: AC | PRN
  Administered 2022-10-20: 500 [IU]

## 2022-10-20 MED ORDER — SODIUM CHLORIDE 0.9% FLUSH
10.0000 mL | Freq: Once | INTRAVENOUS | Status: AC | PRN
  Administered 2022-10-20: 10 mL

## 2022-10-20 NOTE — Patient Instructions (Signed)

## 2022-10-23 ENCOUNTER — Inpatient Hospital Stay (HOSPITAL_BASED_OUTPATIENT_CLINIC_OR_DEPARTMENT_OTHER): Payer: Medicare Other | Admitting: Hematology and Oncology

## 2022-10-23 ENCOUNTER — Encounter: Payer: Self-pay | Admitting: Hematology and Oncology

## 2022-10-23 ENCOUNTER — Inpatient Hospital Stay: Payer: Medicare Other | Attending: Oncology

## 2022-10-23 ENCOUNTER — Other Ambulatory Visit: Payer: Self-pay

## 2022-10-23 ENCOUNTER — Inpatient Hospital Stay: Payer: Medicare Other

## 2022-10-23 VITALS — BP 124/87 | HR 105 | Temp 97.6°F | Resp 16 | Ht 68.0 in | Wt 281.5 lb

## 2022-10-23 DIAGNOSIS — Z170 Estrogen receptor positive status [ER+]: Secondary | ICD-10-CM | POA: Diagnosis not present

## 2022-10-23 DIAGNOSIS — Z5111 Encounter for antineoplastic chemotherapy: Secondary | ICD-10-CM | POA: Insufficient documentation

## 2022-10-23 DIAGNOSIS — D509 Iron deficiency anemia, unspecified: Secondary | ICD-10-CM | POA: Diagnosis not present

## 2022-10-23 DIAGNOSIS — C50912 Malignant neoplasm of unspecified site of left female breast: Secondary | ICD-10-CM

## 2022-10-23 DIAGNOSIS — N951 Menopausal and female climacteric states: Secondary | ICD-10-CM | POA: Diagnosis not present

## 2022-10-23 DIAGNOSIS — C50412 Malignant neoplasm of upper-outer quadrant of left female breast: Secondary | ICD-10-CM | POA: Diagnosis not present

## 2022-10-23 DIAGNOSIS — C792 Secondary malignant neoplasm of skin: Secondary | ICD-10-CM | POA: Diagnosis not present

## 2022-10-23 DIAGNOSIS — Z79811 Long term (current) use of aromatase inhibitors: Secondary | ICD-10-CM | POA: Diagnosis not present

## 2022-10-23 LAB — CBC WITH DIFFERENTIAL (CANCER CENTER ONLY)
Abs Immature Granulocytes: 0.03 10*3/uL (ref 0.00–0.07)
Basophils Absolute: 0 10*3/uL (ref 0.0–0.1)
Basophils Relative: 0 %
Eosinophils Absolute: 0 10*3/uL (ref 0.0–0.5)
Eosinophils Relative: 0 %
HCT: 38.4 % (ref 36.0–46.0)
Hemoglobin: 12.1 g/dL (ref 12.0–15.0)
Immature Granulocytes: 0 %
Lymphocytes Relative: 26 %
Lymphs Abs: 1.9 10*3/uL (ref 0.7–4.0)
MCH: 25.9 pg — ABNORMAL LOW (ref 26.0–34.0)
MCHC: 31.5 g/dL (ref 30.0–36.0)
MCV: 82.1 fL (ref 80.0–100.0)
Monocytes Absolute: 0.6 10*3/uL (ref 0.1–1.0)
Monocytes Relative: 8 %
Neutro Abs: 4.9 10*3/uL (ref 1.7–7.7)
Neutrophils Relative %: 66 %
Platelet Count: 252 10*3/uL (ref 150–400)
RBC: 4.68 MIL/uL (ref 3.87–5.11)
RDW: 21.6 % — ABNORMAL HIGH (ref 11.5–15.5)
WBC Count: 7.5 10*3/uL (ref 4.0–10.5)
nRBC: 0 % (ref 0.0–0.2)

## 2022-10-23 LAB — CMP (CANCER CENTER ONLY)
ALT: 8 U/L (ref 0–44)
AST: 14 U/L — ABNORMAL LOW (ref 15–41)
Albumin: 4.4 g/dL (ref 3.5–5.0)
Alkaline Phosphatase: 72 U/L (ref 38–126)
Anion gap: 6 (ref 5–15)
BUN: 10 mg/dL (ref 6–20)
CO2: 29 mmol/L (ref 22–32)
Calcium: 10.1 mg/dL (ref 8.9–10.3)
Chloride: 102 mmol/L (ref 98–111)
Creatinine: 1.02 mg/dL — ABNORMAL HIGH (ref 0.44–1.00)
GFR, Estimated: >60 mL/min (ref >60)
Glucose, Bld: 82 mg/dL (ref 70–99)
Potassium: 3.8 mmol/L (ref 3.5–5.1)
Sodium: 137 mmol/L (ref 135–145)
Total Bilirubin: 0.4 mg/dL (ref 0.3–1.2)
Total Protein: 8.3 g/dL — ABNORMAL HIGH (ref 6.5–8.1)

## 2022-10-23 MED ORDER — FULVESTRANT 250 MG/5ML IM SOSY
500.0000 mg | PREFILLED_SYRINGE | Freq: Once | INTRAMUSCULAR | Status: AC
  Administered 2022-10-23: 500 mg via INTRAMUSCULAR
  Filled 2022-10-23: qty 10

## 2022-10-23 NOTE — Patient Instructions (Signed)
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
## 2022-10-23 NOTE — Assessment & Plan Note (Addendum)
This is a 52 year old female patient, BRCA negative status post left lumpectomy and sentinel biopsy for a T1CN0 stage Ia IDC back in July 2015, ER 83% positive, PR 66% positive and MIB of 14%, no HER2 amplification had adjuvant TC followed by radiation, did not take antiestrogen therapy who is now here with recurrent breast cancer status post left mastectomy and adjuvant radiation.  She is currently on abemaciclib and fulvestrant for adjuvant therapy given high risk for recurrence.  Dr. Jana Hakim started her on abemaciclib, fulvestrant and anastrozole for adjuvant therapy.   Menopausal labs consistent with menopause. Will try gabapentin QHS for hot flashes, will increase to 2 of gabapentin QHS.  Severe IDA, will replace iron with IV Iron, last ferritin 5, this is likely secondary to hemorrhoidal bleeding.  She had endoscopy and colonoscopy which did not show any other evidence of bleeding. Hb improved significantly.   Faslodex every 28 days RTC in 8 weeks with labs. I asked her to reschedule bone scan CT with no concern for metastatic disease.  Encouraged to stay compliant with all her medications and she expressed understanding Mammogram scheduled for October 25, 2022

## 2022-11-05 ENCOUNTER — Encounter (HOSPITAL_COMMUNITY): Payer: Self-pay

## 2022-11-05 ENCOUNTER — Ambulatory Visit (HOSPITAL_COMMUNITY)
Admission: EM | Admit: 2022-11-05 | Discharge: 2022-11-05 | Disposition: A | Discharge status: Home / Self Care | Payer: Medicare Other | Attending: Physician Assistant | Admitting: Physician Assistant

## 2022-11-05 ENCOUNTER — Ambulatory Visit (INDEPENDENT_AMBULATORY_CARE_PROVIDER_SITE_OTHER): Payer: Medicare Other

## 2022-11-05 DIAGNOSIS — J209 Acute bronchitis, unspecified: Secondary | ICD-10-CM | POA: Diagnosis not present

## 2022-11-05 DIAGNOSIS — M47814 Spondylosis without myelopathy or radiculopathy, thoracic region: Secondary | ICD-10-CM | POA: Diagnosis not present

## 2022-11-05 DIAGNOSIS — Z9012 Acquired absence of left breast and nipple: Secondary | ICD-10-CM | POA: Insufficient documentation

## 2022-11-05 DIAGNOSIS — Z853 Personal history of malignant neoplasm of breast: Secondary | ICD-10-CM | POA: Diagnosis not present

## 2022-11-05 DIAGNOSIS — Z1152 Encounter for screening for COVID-19: Secondary | ICD-10-CM | POA: Diagnosis not present

## 2022-11-05 DIAGNOSIS — R519 Headache, unspecified: Secondary | ICD-10-CM | POA: Diagnosis not present

## 2022-11-05 DIAGNOSIS — J029 Acute pharyngitis, unspecified: Secondary | ICD-10-CM | POA: Diagnosis not present

## 2022-11-05 DIAGNOSIS — C50919 Malignant neoplasm of unspecified site of unspecified female breast: Secondary | ICD-10-CM | POA: Diagnosis not present

## 2022-11-05 DIAGNOSIS — F17210 Nicotine dependence, cigarettes, uncomplicated: Secondary | ICD-10-CM | POA: Diagnosis not present

## 2022-11-05 DIAGNOSIS — J440 Chronic obstructive pulmonary disease with acute lower respiratory infection: Secondary | ICD-10-CM | POA: Diagnosis not present

## 2022-11-05 DIAGNOSIS — R509 Fever, unspecified: Secondary | ICD-10-CM | POA: Diagnosis not present

## 2022-11-05 DIAGNOSIS — R058 Other specified cough: Secondary | ICD-10-CM | POA: Insufficient documentation

## 2022-11-05 DIAGNOSIS — R059 Cough, unspecified: Secondary | ICD-10-CM

## 2022-11-05 MED ORDER — ALBUTEROL SULFATE HFA 108 (90 BASE) MCG/ACT IN AERS: 2.0000 | INHALATION_SPRAY | Freq: Four times a day (QID) | RESPIRATORY_TRACT | 2 refills | Status: AC | PRN

## 2022-11-05 MED ORDER — DOXYCYCLINE HYCLATE 100 MG PO CAPS: 100.0000 mg | ORAL_CAPSULE | Freq: Two times a day (BID) | ORAL | 0 refills | Status: DC

## 2022-11-05 NOTE — ED Triage Notes (Signed)
Patient c/o a productive cough with yellow green sputum, fever, headache, sore throat, body aches, and nasal congestion x 3 days
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
## 2022-11-06 LAB — SARS CORONAVIRUS 2 (TAT 6-24 HRS): SARS Coronavirus 2: NEGATIVE

## 2022-11-20 ENCOUNTER — Inpatient Hospital Stay: Payer: Medicare Other

## 2022-11-20 ENCOUNTER — Encounter: Payer: Self-pay | Admitting: Licensed Clinical Social Worker

## 2022-11-20 ENCOUNTER — Other Ambulatory Visit: Payer: Self-pay

## 2022-11-20 VITALS — BP 157/87 | HR 98 | Temp 98.7°F | Resp 18

## 2022-11-20 DIAGNOSIS — Z79811 Long term (current) use of aromatase inhibitors: Secondary | ICD-10-CM | POA: Diagnosis not present

## 2022-11-20 DIAGNOSIS — Z170 Estrogen receptor positive status [ER+]: Secondary | ICD-10-CM | POA: Diagnosis not present

## 2022-11-20 DIAGNOSIS — Z5111 Encounter for antineoplastic chemotherapy: Secondary | ICD-10-CM | POA: Diagnosis not present

## 2022-11-20 DIAGNOSIS — C792 Secondary malignant neoplasm of skin: Secondary | ICD-10-CM | POA: Diagnosis not present

## 2022-11-20 DIAGNOSIS — D509 Iron deficiency anemia, unspecified: Secondary | ICD-10-CM

## 2022-11-20 DIAGNOSIS — C50912 Malignant neoplasm of unspecified site of left female breast: Secondary | ICD-10-CM

## 2022-11-20 DIAGNOSIS — C50412 Malignant neoplasm of upper-outer quadrant of left female breast: Secondary | ICD-10-CM | POA: Diagnosis not present

## 2022-11-20 LAB — CBC WITH DIFFERENTIAL (CANCER CENTER ONLY)
Abs Immature Granulocytes: 0.03 10*3/uL (ref 0.00–0.07)
Basophils Absolute: 0 10*3/uL (ref 0.0–0.1)
Basophils Relative: 1 %
Eosinophils Absolute: 0 10*3/uL (ref 0.0–0.5)
Eosinophils Relative: 0 %
HCT: 35.4 % — ABNORMAL LOW (ref 36.0–46.0)
Hemoglobin: 11.3 g/dL — ABNORMAL LOW (ref 12.0–15.0)
Immature Granulocytes: 0 %
Lymphocytes Relative: 40 %
Lymphs Abs: 2.8 10*3/uL (ref 0.7–4.0)
MCH: 26.7 pg (ref 26.0–34.0)
MCHC: 31.9 g/dL (ref 30.0–36.0)
MCV: 83.7 fL (ref 80.0–100.0)
Monocytes Absolute: 0.6 10*3/uL (ref 0.1–1.0)
Monocytes Relative: 9 %
Neutro Abs: 3.4 10*3/uL (ref 1.7–7.7)
Neutrophils Relative %: 50 %
Platelet Count: 313 10*3/uL (ref 150–400)
RBC: 4.23 MIL/uL (ref 3.87–5.11)
RDW: 20 % — ABNORMAL HIGH (ref 11.5–15.5)
WBC Count: 6.9 10*3/uL (ref 4.0–10.5)
nRBC: 0 % (ref 0.0–0.2)

## 2022-11-20 LAB — CMP (CANCER CENTER ONLY)
ALT: 6 U/L (ref 0–44)
AST: 10 U/L — ABNORMAL LOW (ref 15–41)
Albumin: 3.7 g/dL (ref 3.5–5.0)
Alkaline Phosphatase: 68 U/L (ref 38–126)
Anion gap: 6 (ref 5–15)
BUN: <5 mg/dL — ABNORMAL LOW (ref 6–20)
CO2: 29 mmol/L (ref 22–32)
Calcium: 9.6 mg/dL (ref 8.9–10.3)
Chloride: 104 mmol/L (ref 98–111)
Creatinine: 0.98 mg/dL (ref 0.44–1.00)
GFR, Estimated: >60 mL/min (ref >60)
Glucose, Bld: 72 mg/dL (ref 70–99)
Potassium: 4 mmol/L (ref 3.5–5.1)
Sodium: 139 mmol/L (ref 135–145)
Total Bilirubin: 0.3 mg/dL (ref 0.3–1.2)
Total Protein: 7.6 g/dL (ref 6.5–8.1)

## 2022-11-20 MED ORDER — FULVESTRANT 250 MG/5ML IM SOSY
500.0000 mg | PREFILLED_SYRINGE | Freq: Once | INTRAMUSCULAR | Status: AC
  Administered 2022-11-20: 500 mg via INTRAMUSCULAR
  Filled 2022-11-20: qty 10

## 2022-11-20 NOTE — Progress Notes (Signed)
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    Nairi Oswald E Jessey Huyett, LCSW

## 2022-11-28 ENCOUNTER — Other Ambulatory Visit: Payer: Self-pay

## 2022-11-28 ENCOUNTER — Emergency Department (HOSPITAL_COMMUNITY): Payer: Medicare Other

## 2022-11-28 ENCOUNTER — Encounter (HOSPITAL_COMMUNITY): Payer: Self-pay

## 2022-11-28 ENCOUNTER — Emergency Department (HOSPITAL_COMMUNITY)
Admission: EM | Admit: 2022-11-28 | Discharge: 2022-11-28 | Disposition: A | Discharge status: Home / Self Care | Payer: Medicare Other | Attending: Emergency Medicine | Admitting: Emergency Medicine

## 2022-11-28 ENCOUNTER — Ambulatory Visit (INDEPENDENT_AMBULATORY_CARE_PROVIDER_SITE_OTHER): Payer: Medicare Other | Admitting: Family Medicine

## 2022-11-28 ENCOUNTER — Encounter: Payer: Self-pay | Admitting: Family Medicine

## 2022-11-28 VITALS — BP 139/89 | HR 111 | Ht 68.0 in | Wt 270.6 lb

## 2022-11-28 DIAGNOSIS — A419 Sepsis, unspecified organism: Secondary | ICD-10-CM | POA: Diagnosis not present

## 2022-11-28 DIAGNOSIS — E119 Type 2 diabetes mellitus without complications: Secondary | ICD-10-CM | POA: Insufficient documentation

## 2022-11-28 DIAGNOSIS — R059 Cough, unspecified: Secondary | ICD-10-CM | POA: Diagnosis not present

## 2022-11-28 DIAGNOSIS — J189 Pneumonia, unspecified organism: Secondary | ICD-10-CM

## 2022-11-28 DIAGNOSIS — R0602 Shortness of breath: Secondary | ICD-10-CM | POA: Diagnosis not present

## 2022-11-28 DIAGNOSIS — U071 COVID-19: Secondary | ICD-10-CM | POA: Diagnosis not present

## 2022-11-28 DIAGNOSIS — J181 Lobar pneumonia, unspecified organism: Secondary | ICD-10-CM | POA: Diagnosis not present

## 2022-11-28 DIAGNOSIS — Z7951 Long term (current) use of inhaled steroids: Secondary | ICD-10-CM | POA: Diagnosis not present

## 2022-11-28 DIAGNOSIS — J9811 Atelectasis: Secondary | ICD-10-CM | POA: Diagnosis not present

## 2022-11-28 DIAGNOSIS — J069 Acute upper respiratory infection, unspecified: Secondary | ICD-10-CM | POA: Diagnosis not present

## 2022-11-28 DIAGNOSIS — R1110 Vomiting, unspecified: Secondary | ICD-10-CM | POA: Diagnosis not present

## 2022-11-28 DIAGNOSIS — J449 Chronic obstructive pulmonary disease, unspecified: Secondary | ICD-10-CM | POA: Diagnosis not present

## 2022-11-28 DIAGNOSIS — J1282 Pneumonia due to coronavirus disease 2019: Secondary | ICD-10-CM | POA: Diagnosis not present

## 2022-11-28 LAB — PROTIME-INR
INR: 1 (ref 0.8–1.2)
Prothrombin Time: 13.5 seconds (ref 11.4–15.2)

## 2022-11-28 LAB — COMPREHENSIVE METABOLIC PANEL
ALT: 10 U/L (ref 0–44)
AST: 21 U/L (ref 15–41)
Albumin: 3.5 g/dL (ref 3.5–5.0)
Alkaline Phosphatase: 61 U/L (ref 38–126)
Anion gap: 11 (ref 5–15)
BUN: <5 mg/dL — ABNORMAL LOW (ref 6–20)
CO2: 23 mmol/L (ref 22–32)
Calcium: 8.9 mg/dL (ref 8.9–10.3)
Chloride: 103 mmol/L (ref 98–111)
Creatinine, Ser: 1.07 mg/dL — ABNORMAL HIGH (ref 0.44–1.00)
GFR, Estimated: >60 mL/min (ref >60)
Glucose, Bld: 109 mg/dL — ABNORMAL HIGH (ref 70–99)
Potassium: 3.1 mmol/L — ABNORMAL LOW (ref 3.5–5.1)
Sodium: 137 mmol/L (ref 135–145)
Total Bilirubin: 0.4 mg/dL (ref 0.3–1.2)
Total Protein: 7.4 g/dL (ref 6.5–8.1)

## 2022-11-28 LAB — CBC WITH DIFFERENTIAL/PLATELET
Abs Immature Granulocytes: 0.03 10*3/uL (ref 0.00–0.07)
Basophils Absolute: 0 10*3/uL (ref 0.0–0.1)
Basophils Relative: 0 %
Eosinophils Absolute: 0 10*3/uL (ref 0.0–0.5)
Eosinophils Relative: 0 %
HCT: 34.9 % — ABNORMAL LOW (ref 36.0–46.0)
Hemoglobin: 11.1 g/dL — ABNORMAL LOW (ref 12.0–15.0)
Immature Granulocytes: 1 %
Lymphocytes Relative: 31 %
Lymphs Abs: 1.5 10*3/uL (ref 0.7–4.0)
MCH: 26.1 pg (ref 26.0–34.0)
MCHC: 31.8 g/dL (ref 30.0–36.0)
MCV: 82.1 fL (ref 80.0–100.0)
Monocytes Absolute: 0.6 10*3/uL (ref 0.1–1.0)
Monocytes Relative: 13 %
Neutro Abs: 2.5 10*3/uL (ref 1.7–7.7)
Neutrophils Relative %: 55 %
Platelets: 265 10*3/uL (ref 150–400)
RBC: 4.25 MIL/uL (ref 3.87–5.11)
RDW: 20.1 % — ABNORMAL HIGH (ref 11.5–15.5)
WBC: 4.7 10*3/uL (ref 4.0–10.5)
nRBC: 0 % (ref 0.0–0.2)

## 2022-11-28 LAB — LACTIC ACID, PLASMA
Lactic Acid, Venous: 1.9 mmol/L (ref 0.5–1.9)
Lactic Acid, Venous: 1.4 mmol/L (ref 0.5–1.9)

## 2022-11-28 LAB — RESP PANEL BY RT-PCR (RSV, FLU A&B, COVID)  RVPGX2
Influenza A by PCR: NEGATIVE
Influenza B by PCR: NEGATIVE
Resp Syncytial Virus by PCR: NEGATIVE
SARS Coronavirus 2 by RT PCR: POSITIVE — AB

## 2022-11-28 LAB — I-STAT BETA HCG BLOOD, ED (MC, WL, AP ONLY): I-stat hCG, quantitative: <5 m[IU]/mL (ref <5)

## 2022-11-28 MED ORDER — POTASSIUM CHLORIDE CRYS ER 20 MEQ PO TBCR
30.0000 meq | EXTENDED_RELEASE_TABLET | Freq: Once | ORAL | Status: AC
  Administered 2022-11-28: 30 meq via ORAL
  Filled 2022-11-28: qty 1

## 2022-11-28 MED ORDER — AZITHROMYCIN 250 MG PO TABS: 250.0000 mg | ORAL_TABLET | Freq: Every day | ORAL | 0 refills | Status: DC

## 2022-11-28 MED ORDER — AMOXICILLIN-POT CLAVULANATE 875-125 MG PO TABS: 1.0000 | ORAL_TABLET | Freq: Two times a day (BID) | ORAL | 0 refills | Status: DC

## 2022-11-28 MED ORDER — POTASSIUM CHLORIDE 10 MEQ/100ML IV SOLN
10.0000 meq | Freq: Once | INTRAVENOUS | Status: AC
  Administered 2022-11-28: 10 meq via INTRAVENOUS
  Filled 2022-11-28: qty 100

## 2022-11-28 MED ORDER — ONDANSETRON HCL 4 MG PO TABS: 4.0000 mg | ORAL_TABLET | Freq: Three times a day (TID) | ORAL | 0 refills | Status: DC | PRN

## 2022-11-28 MED ORDER — IOHEXOL 350 MG/ML SOLN
75.0000 mL | Freq: Once | INTRAVENOUS | Status: AC | PRN
  Administered 2022-11-28: 75 mL via INTRAVENOUS

## 2022-11-28 MED ORDER — MOLNUPIRAVIR 200 MG PO CAPS: 4.0000 | ORAL_CAPSULE | Freq: Two times a day (BID) | ORAL | 0 refills | Status: AC

## 2022-11-28 MED ORDER — SODIUM CHLORIDE 0.9 % IV BOLUS
1000.0000 mL | Freq: Once | INTRAVENOUS | Status: AC
  Administered 2022-11-28: 1000 mL via INTRAVENOUS

## 2022-11-28 MED ORDER — ONDANSETRON HCL 4 MG/2ML IJ SOLN
4.0000 mg | Freq: Once | INTRAMUSCULAR | Status: AC
  Administered 2022-11-28: 4 mg via INTRAVENOUS
  Filled 2022-11-28: qty 2

## 2022-11-28 NOTE — Patient Instructions (Signed)
It was great to see you today! Here's what we talked about:  We believe you could be at risk for a pneumonia given you are on chemotherapy and have had fevers with persistent symptoms for 3 weeks. We will call the ER to let them know that you are on your way for evaluation. Please follow up ASAP with [name] after your ER visit so we can monitor you.  Please let me know if you have any other questions.  Dr. Marcha Dutton

## 2022-11-28 NOTE — Discharge Instructions (Addendum)
You were evaluated for shortness of breath, nausea, cough, and fevers.  You do have an active COVID-19 infection.  You also appear to be developing an early pneumonia in the left side of your lungs.  I have prescribed molnupiravir, and antiviral medication.  Please take as directed.  I also prescribed Zofran for nausea.  Antibiotics were prescribed for the early pneumonia including Augmentin and azithromycin.  Please complete these prescriptions.  Please follow-up with your primary care provider as needed.  You will likely need a repeat chest x-ray in approximately 3 to 4 weeks to check for resolution of imaging symptoms.  If you develop worsening shortness of breath, chest pain or other life-threatening symptoms, please return to the emergency department.

## 2022-11-28 NOTE — Progress Notes (Signed)
    SUBJECTIVE:   CHIEF COMPLAINT / HPI:   Sick symptoms Patient has had cough, congestion, sweats, and general malaise for the last 3 weeks.  She was seen on January 15 urgent care when she was given doxycycline for presumed COPD exacerbation, but this did not help.  She was also tested for COVID at this time which was negative.  Since this time, she has started to have nausea and vomiting, and she is able to only hold down ensures and water at this point.  She also ran a fever of up to 102 yesterday.  She is urinating 3-4 times a day.  She feels dehydrated.   PERTINENT  PMH / PSH: History of breast cancer status post resection now on chemotherapy  OBJECTIVE:   BP 139/89   Pulse (!) 111   Ht '5\' 8"'$  (1.727 m)   Wt 270 lb 9.6 oz (122.7 kg)   LMP 07/13/2021 (Approximate)   SpO2 99%   BMI 41.14 kg/m   General: Alert and oriented, in NAD, sick appearing Skin: Warm, dry, and intact without lesions HEENT: NCAT, EOM grossly normal, midline nasal septum Cardiac: Tachycardic, regular rhythm, no m/r/g appreciated Respiratory: CTAB without wheezes rales or rhonchi, breathing and speaking comfortably on RA Extremities: Moves all extremities grossly equally, ambulates slowly Neurological: No gross focal deficit Psychiatric: Depressed mood and affect, PHQ-9 12 with negative #9  ASSESSMENT/PLAN:   Upper respiratory infection Patient's history of breast cancer on chemotherapy along with prolonged disease course and new fevers concerning for developing bacterial process such as pneumonia.  My exam was overall reassuring aside from tachycardia, though she appeared well-hydrated otherwise.  Discussed outpatient management with CXR to evaluate for pneumonia and very close follow-up versus going to the ED, and patient desires going to the ED to obtain chest x-ray and likely IV fluids.  Patient went to ED by private vehicle; I called the emergency room to let them know of her arrival.  Discussed following  up after ED visit versus hospitalization to ensure continued improvement.   Ethelene Hal, MD [place]

## 2022-11-28 NOTE — ED Provider Triage Note (Signed)
Emergency Medicine Provider Triage Evaluation Note  Kimberly [name] , a 52 y.o. female  was evaluated in triage.  Pt complains of 4 weeks of cough, vomiting, hot and cold chills.  She has had exposure in the house.  History of diabetes.  No abdominal pain..  Review of Systems  Positive: cough Negative: sob  Physical Exam  BP 110/89 (BP Location: Left Wrist)   Pulse (!) 107   Temp 98.6 F (37 C) (Oral)   Resp 18   Ht 5' 8"$  (1.727 m)   Wt 122.5 kg   LMP 07/13/2021 (Approximate)   SpO2 99%   BMI 41.05 kg/m  Gen:   Awake, no distress   Resp:  Normal effort  MSK:   Moves extremities without difficulty  Other:    Medical Decision Making  Medically screening exam initiated at 12:47 PM.  Appropriate orders placed.  Jennasis Ings was informed that the remainder of the evaluation will be completed by another provider, this initial triage assessment does not replace that evaluation, and the importance of remaining in the ED until their evaluation is complete.     Margarita Mail, [name] 11/28/22 1248

## 2022-11-28 NOTE — ED Triage Notes (Signed)
Sent by PCP due to having cough sob and fever x 3 weeks.  On oral chemo for breast cancer

## 2022-11-28 NOTE — ED Provider Notes (Signed)
[facility] Provider Note   CSN: [id number] Arrival date & time: 11/28/22  1205     History  Chief Complaint  Patient presents with   Shortness of Breath    Kimberly [name] is a 52 y.o. female.  Patient presents the emergency department due to shortness of breath, cough, and intermittent fevers over the past 3 weeks.  Patient is on oral chemotherapy due to breast cancer.  Patient with previous left-sided mastectomy.  Patient denies chest pain, abdominal pain.  She does endorse intermittent nausea and vomiting.  Past medical history significant for type II DM without complication, history of radiation therapy, history of COPD, bipolar 1 disorder, heart murmur  HPI     Home Medications Prior to Admission medications   Medication Sig Start Date End Date Taking? Authorizing Provider  amoxicillin-clavulanate (AUGMENTIN) 875-125 MG tablet Take 1 tablet by mouth every 12 (twelve) hours. 11/28/22  Yes Dorothyann Peng, [name]  azithromycin (ZITHROMAX) 250 MG tablet Take 1 tablet (250 mg total) by mouth daily. Take first 2 tablets together, then 1 every day until finished. 11/28/22  Yes Dorothyann Peng, [name]  molnupiravir EUA (LAGEVRIO) 200 MG CAPS capsule Take 4 capsules (800 mg total) by mouth 2 (two) times daily for 5 days. 11/28/22 12/03/22 Yes Dorothyann Peng, [name]  ondansetron (ZOFRAN) 4 MG tablet Take 1 tablet (4 mg total) by mouth every 8 (eight) hours as needed for nausea or vomiting. 11/28/22  Yes Dorothyann Peng, [name]  abemaciclib (VERZENIO) 50 MG tablet Take 1 tablet (50 mg total) by mouth 2 (two) times daily. Swallow tablets whole. Do not chew, crush, or split tablets before swallowing. 02/02/22   Benay Pike, MD  albuterol (VENTOLIN HFA) 108 (90 Base) MCG/ACT inhaler Inhale 2 puffs into the lungs every 6 (six) hours as needed for wheezing or shortness of breath. 11/05/22   Fransico Meadow, [name]  AMBULATORY NON FORMULARY MEDICATION  Medication Name: Diltiazem 2%/ Lidocaine 5% Using your index finger, apply a small amount of medication inside the rectum up to your first knuckle/joint 4 daily x 8 weeks. 08/07/21   Esterwood, Amy S, [name]  anastrozole (ARIMIDEX) 1 MG tablet Take 1 tablet (1 mg total) by mouth daily. 10/04/22   Causey, Charlestine Massed, NP  busPIRone (BUSPAR) 15 MG tablet Take 15 mg by mouth 3 (three) times daily. 10/29/21   [provider]  doxepin (SINEQUAN) 75 MG capsule Take 75 mg by mouth at bedtime. 11/04/21   [provider]  doxycycline (VIBRAMYCIN) 100 MG capsule Take 1 capsule (100 mg total) by mouth 2 (two) times daily. 11/05/22   Fransico Meadow, [name]  ferrous sulfate 324 MG TBEC Take 1 tablet (324 mg total) by mouth every other day. 03/08/22   Welborn, Ryan, DO  Fulvestrant (FASLODEX IM) Inject 2 Doses into the muscle every 14 (fourteen) days. Every other Tuesday    [provider]  gabapentin (NEURONTIN) 300 MG capsule Take 1 capsule (300 mg total) by mouth at bedtime. 09/06/22   Benay Pike, MD  hydrocortisone (ANUSOL-HC) 25 MG suppository Use every other night for 2-weeks until prescription is complete 08/11/21   Mansouraty, Telford Nab., MD  lidocaine-prilocaine (EMLA) cream Apply 1 application topically as needed. Patient taking differently: Apply 1 application  topically daily as needed (port access). 07/21/21   Magrinat, Virgie Dad, MD  montelukast (SINGULAIR) 10 MG tablet Take 1 tablet (10 mg total) by mouth at bedtime. Patient taking differently:  Take 10 mg by mouth daily as needed (allergies). 01/25/21   Fenton Foy, NP  pantoprazole (PROTONIX) 40 MG tablet Take 1 tablet (40 mg total) by mouth daily. 06/29/22   Erskine Emery, MD  PE-Doxylamine-DM-GG-APAP (VICKS DAYQUIL/NYQUIL SEVERE PO) Take 30 mLs by mouth at bedtime as needed (cold symptoms).    [provider]  prazosin (MINIPRESS) 1 MG capsule Take 1 mg by mouth at bedtime. 03/29/21   [provider]   prochlorperazine (COMPAZINE) 5 MG tablet TAKE 1 TABLET (5 MG) BY MOUTH EVERY 6 HOURS AS NEEDED FOR NAUSEA OR VOMITING. MAY TAKE 2 TABLETS (10 mg) IF 5 MG NOT SUFFICIENT. Patient taking differently: Take 5 mg by mouth every 6 (six) hours as needed for vomiting or nausea. 12/06/21   Benay Pike, MD  Pseudoeph-CPM-DM-APAP (TYLENOL COLD & FLU DAY/NIGHT PO) Take 2 capsules by mouth at bedtime as needed (cold symptoms).    [provider]  QUEtiapine (SEROQUEL) 400 MG tablet Take 400 mg by mouth at bedtime.  11/29/19   [provider]  rosuvastatin (CRESTOR) 10 MG tablet Take 1 tablet (10 mg total) by mouth daily. 06/08/21   Concepcion Living, MD  Tiotropium Bromide Monohydrate (SPIRIVA RESPIMAT) 1.25 MCG/ACT AERS Inhale 2 puffs into the lungs daily. 01/25/21 09/17/22  Fenton Foy, NP      Allergies    Patient has no known allergies.    Review of Systems   Review of Systems  Constitutional:  Positive for fever.  Respiratory:  Positive for cough and shortness of breath.   Gastrointestinal:  Positive for nausea and vomiting.    Physical Exam Updated Vital Signs BP (!) 142/91   Pulse 82   Temp 98.8 F (37.1 C) (Oral)   Resp 15   Ht '5\' 8"'$  (1.727 m)   Wt 122.5 kg   LMP 07/13/2021 (Approximate)   SpO2 99%   BMI 41.05 kg/m  Physical Exam Vitals and nursing note reviewed.  Constitutional:      General: She is not in acute distress.    Appearance: She is well-developed.  HENT:     Head: Normocephalic and atraumatic.     Mouth/Throat:     Mouth: Mucous membranes are moist.  Eyes:     Extraocular Movements: Extraocular movements intact.     Conjunctiva/sclera: Conjunctivae normal.  Cardiovascular:     Rate and Rhythm: Normal rate and regular rhythm.     Heart sounds: No murmur heard. Pulmonary:     Effort: Pulmonary effort is normal. No respiratory distress.     Breath sounds: Normal breath sounds.  Chest:     Chest wall: No tenderness.    Abdominal:      Palpations: Abdomen is soft.     Tenderness: There is no abdominal tenderness.  Musculoskeletal:        General: No swelling.     Cervical back: Neck supple.  Skin:    General: Skin is warm and dry.     Capillary Refill: Capillary refill takes less than 2 seconds.  Neurological:     Mental Status: She is alert.  Psychiatric:        Mood and Affect: Mood normal.     ED Results / Procedures / Treatments   Labs (all labs ordered are listed, but only abnormal results are displayed) Labs Reviewed  RESP PANEL BY RT-PCR (RSV, FLU A&B, COVID)  RVPGX2 - Abnormal; Notable for the following components:      Result Value  SARS Coronavirus 2 by RT PCR POSITIVE (*)    All other components within normal limits  COMPREHENSIVE METABOLIC PANEL - Abnormal; Notable for the following components:   Potassium 3.1 (*)    Glucose, Bld 109 (*)    BUN <5 (*)    Creatinine, Ser 1.07 (*)    All other components within normal limits  CBC WITH DIFFERENTIAL/PLATELET - Abnormal; Notable for the following components:   Hemoglobin 11.1 (*)    HCT 34.9 (*)    RDW 20.1 (*)    All other components within normal limits  CULTURE, BLOOD (ROUTINE X 2)  CULTURE, BLOOD (ROUTINE X 2)  LACTIC ACID, PLASMA  LACTIC ACID, PLASMA  PROTIME-INR  URINALYSIS, ROUTINE W REFLEX MICROSCOPIC  I-STAT BETA HCG BLOOD, ED ([facility], [facility], [facility] ONLY)    EKG None  Radiology CT Angio Chest PE W and/or Wo Contrast  Result Date: 11/28/2022 CLINICAL DATA:  Pulmonary embolus suspected with high probability. Shortness of breath EXAM: CT ANGIOGRAPHY CHEST WITH CONTRAST TECHNIQUE: Multidetector CT imaging of the chest was performed using the standard protocol during bolus administration of intravenous contrast. Multiplanar CT image reconstructions and MIPs were obtained to evaluate the vascular anatomy. RADIATION DOSE REDUCTION: This exam was performed according to the departmental dose-optimization program which includes automated exposure  control, adjustment of the mA and/or kV according to patient size and/or use of iterative reconstruction technique. CONTRAST:  15m OMNIPAQUE IOHEXOL 350 MG/ML SOLN COMPARISON:  10/08/2022 FINDINGS: Cardiovascular: There is good opacification of the central and segmental pulmonary arteries. No focal filling defects. No evidence of significant pulmonary embolus. Normal heart size. No pericardial effusions. Normal caliber thoracic aorta. Scattered aortic calcifications. Mediastinum/Nodes: Thyroid gland is unremarkable. Esophagus is decompressed. Scattered lymph nodes are not pathologically enlarged, likely reactive. Lungs/Pleura: Motion artifact limits examination. There is subpleural interstitial change in the left anterior chest, likely representing radiation changes. Patchy peribronchial infiltrates are suggested in the left lingula which may indicate developing bronchopneumonia. Upper Abdomen: No acute abnormality. Musculoskeletal: Postoperative left mastectomy with surgical clips in the left axilla. Degenerative changes in the spine. Review of the MIP images confirms the above findings. IMPRESSION: 1. No evidence of significant pulmonary embolus. 2. Mild patchy infiltration in the left lingula may indicate early bronchopneumonia. 3. 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 Electronically Signed   By: WLucienne CapersM.D.   On: 11/28/2022 20:00   DG Chest 2 View  Result Date: 11/28/2022 CLINICAL DATA:  Sepsis, cough and vomiting. EXAM: CHEST - 2 VIEW COMPARISON:  11/05/2022 FINDINGS: The heart size and mediastinal contours are within normal limits. Stable positioning of Port-A-Cath. Mildly more prominent atelectasis at the right lung base. There is no evidence of pulmonary edema, consolidation, pneumothorax, nodule or pleural fluid. The visualized skeletal structures are unremarkable. IMPRESSION: Mildly more prominent atelectasis at the right lung base. Electronically Signed    By: GAletta EdouardM.D.   On: 11/28/2022 13:11    Procedures Procedures    Medications Ordered in ED Medications  sodium chloride 0.9 % bolus 1,000 mL (0 mLs Intravenous Stopped 11/28/22 1921)  ondansetron (ZOFRAN) injection 4 mg (4 mg Intravenous Given 11/28/22 1821)  potassium chloride 10 mEq in 100 mL IVPB (0 mEq Intravenous Stopped 11/28/22 1921)  potassium chloride (KLOR-CON M) CR tablet 30 mEq (30 mEq Oral Given 11/28/22 1819)  iohexol (OMNIPAQUE) 350 MG/ML injection 75 mL (75 mLs Intravenous Contrast Given 11/28/22 1953)    ED Course/ Medical Decision Making/ A&P  Medical Decision Making Amount and/or Complexity of Data Reviewed Labs: ordered. Radiology: ordered.  Risk Prescription drug management.   This patient presents to the ED for concern of shortness of breath, cough, fever, this involves an extensive number of treatment options, and is a complaint that carries with it a high risk of complications and morbidity.  The differential diagnosis includes pneumonia, COVID-19, influenza, RSV, other viral infections, others   Co morbidities that complicate the patient evaluation  Breast cancer, COPD   Additional history obtained:  Additional history obtained from EMS External records from outside source obtained and reviewed including family medicine notes from earlier today   Lab Tests:  I Ordered, and personally interpreted labs.  The pertinent results include: Positive for COVID-19, potassium 3.1   Imaging Studies ordered:  I ordered imaging studies including CT angio chest PE study, chest x-ray I independently visualized and interpreted imaging which showed atelectasis of the right lung base on chest x-ray. CT shows 1. No evidence of significant pulmonary embolus.  2. Mild patchy infiltration in the left lingula may indicate early  bronchopneumonia.  3. Previous left mastectomy with subpleural fibrosis likely  radiation change.   I  agree with the radiologist interpretation   Cardiac Monitoring: / EKG:  The patient was maintained on a cardiac monitor.  I personally viewed and interpreted the cardiac monitored which showed an underlying rhythm of: Sinus rhythm   Problem List / ED Course / Critical interventions / Medication management   I ordered medication including Zofran for nausea, saline bolus for fluid resuscitation.  Potassium for hypokalemia Reevaluation of the patient after these medicines showed that the patient improved I have reviewed the patients home medicines and have made adjustments as needed   Test / Admission - Considered:  The patient has COVID and possibly a very early pneumonia located in the left lingula.  I question if the patient may have had some other viral infection prior to COVID was starting to resolve prior to contracting COVID.  This is based on the patient's timeline of symptoms.  At this time her vitals are within normal limits, there is no emergent reason that would require admission or further emergent evaluation.  Plan to discharge patient home with molnupiravir prescription along with antibiotics for CAP.  (Consider Paxlovid but appears to have interactions with patient's current medications) return precautions provided.        Final Clinical Impression(s) / ED Diagnoses Final diagnoses:  Community acquired pneumonia of left lung, unspecified part of lung  COVID-19    Rx / DC Orders ED Discharge Orders          Ordered    ondansetron (ZOFRAN) 4 MG tablet  Every 8 hours PRN        11/28/22 2135    amoxicillin-clavulanate (AUGMENTIN) 875-125 MG tablet  Every 12 hours        11/28/22 2135    azithromycin (ZITHROMAX) 250 MG tablet  Daily        11/28/22 2135    molnupiravir EUA (LAGEVRIO) 200 MG CAPS capsule  2 times daily        11/28/22 2135              Ronny Bacon 11/28/22 2138    Gareth Morgan, MD 11/29/22 1659

## 2022-11-28 NOTE — Assessment & Plan Note (Signed)
Patient's history of breast cancer on chemotherapy along with prolonged disease course and new fevers concerning for developing bacterial process such as pneumonia.  My exam was overall reassuring aside from tachycardia, though she appeared well-hydrated otherwise.  Discussed outpatient management with CXR to evaluate for pneumonia and very close follow-up versus going to the ED, and patient desires going to the ED to obtain chest x-ray and likely IV fluids.  Patient went to ED by private vehicle; I called the emergency room to let them know of her arrival.  Discussed following up after ED visit versus hospitalization to ensure continued improvement.

## 2022-11-29 ENCOUNTER — Telehealth: Payer: Self-pay

## 2022-11-29 ENCOUNTER — Other Ambulatory Visit (HOSPITAL_COMMUNITY): Payer: Self-pay

## 2022-11-29 NOTE — Telephone Encounter (Signed)
Patient calls nurse line. She went to the hospital yesterday, reports that she tested positive for COVID and pneumonia.   Was sent antiviral- insurance covered $800, remainder was $300, patient unable to afford.   She reports that she is not feeling any better at this point. She started abx around midnight today.  Supportive measures discussed. She wanted to make Dr. Marcha Dutton aware of ED diagnoses. She is also asking if there are any medication assistance options for antiviral.   Return/ ED precautions discussed.   She is requesting returned phone call at [phone].   Will forward to Dr. Marcha Dutton and pharmacy team.   Talbot Grumbling, RN

## 2022-12-03 LAB — CULTURE, BLOOD (ROUTINE X 2)
Culture: NO GROWTH
Culture: NO GROWTH
Special Requests: ADEQUATE

## 2022-12-19 ENCOUNTER — Inpatient Hospital Stay: Payer: Medicare Other

## 2022-12-19 ENCOUNTER — Inpatient Hospital Stay: Payer: Medicare Other | Admitting: Physician Assistant

## 2022-12-19 ENCOUNTER — Other Ambulatory Visit: Payer: Self-pay | Admitting: Physician Assistant

## 2022-12-19 DIAGNOSIS — D509 Iron deficiency anemia, unspecified: Secondary | ICD-10-CM

## 2022-12-19 DIAGNOSIS — C50912 Malignant neoplasm of unspecified site of left female breast: Secondary | ICD-10-CM

## 2022-12-20 ENCOUNTER — Ambulatory Visit: Payer: Medicare Other | Admitting: Gastroenterology

## 2022-12-20 NOTE — Progress Notes (Deleted)
[facility] Consult Note:  History: Kimberly [name] 12/20/2022  Referring provider: Arlyce Dice, MD  Reason for consult/chief complaint: No chief complaint on file.   Subjective  HPI: Kimberly [name] was last seen in the office October 2022 for iron deficiency anemia occurring for several years and chronic rectal bleeding believed to be hemorrhoidal in nature.  She had had a colonoscopy with me in 2020 (diminutive polyp, fair prep).  Other clinical details of this patient's medical history outlined in that extensive office consult note. She underwent EGD and colonoscopy with Dr. Rush Landmark (first endoscopic availability) in October 2022.  Report findings noted below.  Essentially no cause of blood loss seen other than internal hemorrhoids.  She has been following with Dr. Jana Hakim for her metastatic breast cancer as well as iron deficiency anemia, and records indicate she has been receiving regular IV iron infusions, most recently January 30. ________________  ***   ROS:  Review of Systems   Past Medical History: Past Medical History:  Diagnosis Date   Acute cholecystitis 04/17/2019   Anemia    Anxiety    Arthritis    Bipolar 1 disorder ([place])    Blood transfusion without reported diagnosis 2007   post bleeding childbirth   Cancer ([place])    left breast cancer 2015   COPD (chronic obstructive pulmonary disease) ([place])    pt reports she is not on oxygen   COVID-19 virus infection 06/07/2019   Depression    Diabetes mellitus without complication ([place])    gestational   Hand injury, right, initial encounter 03/19/2019   Heart murmur    as a child-not adult-no cardiac work up   History of radiation therapy 08/31/14-10/21/14   left breast/ left supraclavicular 50.4 Gy 28 fx, lef tposterior axillary boost 9.52 Gy 28 fx, left rbeast boost/ 10 Gy 5 fx   Personal history of chemotherapy    Personal history of radiation therapy    Rash and nonspecific skin  eruption 12/27/2020     Past Surgical History: Past Surgical History:  Procedure Laterality Date   BREAST BIOPSY Left 04/01/14   BREAST BIOPSY Right 04/19/14   BREAST LUMPECTOMY WITH AXILLARY LYMPH NODE DISSECTION Bilateral 05/03/14   CHOLECYSTECTOMY N/A 04/18/2019   Procedure: LAPAROSCOPIC CHOLECYSTECTOMY WITH INTRAOPERATIVE CHOLANGIOGRAM;  Surgeon: Jovita Kussmaul, MD;  Location: [facility];  Service: General;  Laterality: N/A;   DILATION AND CURETTAGE OF UTERUS     after childbirth   MASS EXCISION Right 07/25/2021   Procedure: RIGHT AXILLARY MASS EXCISION;  Surgeon: [name], MD;  Location: [facility];  Service: General;  Laterality: Right;   MINOR BREAST BIOPSY Left 07/25/2021   Procedure: SKIN PUNCH BIOPSY LEFT BREAST;  Surgeon: [name], MD;  Location: [facility];  Service: General;  Laterality: Left;   MULTIPLE TOOTH EXTRACTIONS     only 5 left   PORTACATH PLACEMENT Right 06/18/2014   Procedure: INSERTION PORT-A-CATH;  Surgeon: [name], MD;  Location: [facility];  Service: General;  Laterality: Right;   TONSILLECTOMY AND ADENOIDECTOMY     TOTAL MASTECTOMY Left 08/28/2021   Procedure: LEFT TOTAL MASTECTOMY;  Surgeon: [name], MD;  Location: [facility];  Service: General;  Laterality: Left;  23 MINUTES- POST PER KELLY AND SHE WILL PUT IN ROOM     Family History: Family History  Problem Relation Age of Onset   Heart disease Father    Cancer Father        Prostate   Cancer Maternal Grandmother  Depression Maternal Grandfather    Cancer Paternal Grandmother    Stomach cancer Neg Hx    Esophageal cancer Neg Hx    Rectal cancer Neg Hx     Social History: Social History   Socioeconomic History   Marital status: Widowed    Spouse name: Not on file   Number of children: 5   Years of education: 12   Highest education level: High school graduate  Occupational History   Occupation: disabled  Tobacco Use   Smoking status: Every Day     Packs/day: 0.50    Years: 34.00    Total pack years: 17.00    Types: Cigarettes   Smokeless tobacco: Never  Vaping Use   Vaping Use: Never used  Substance and Sexual Activity   Alcohol use: No   Drug use: No   Sexual activity: Not Currently  Other Topics Concern   Not on file  Social History Narrative   Patient lives in [place] with 3 of her children.    One adult son lives "up the road" and one lives in [place].   Patient is on disability.    Patient enjoys spending time with her family and watching TV.    Patient walks around her home for exercise.    Social Determinants of Health   Financial Resource Strain: Medium Risk (11/08/2021)   Overall Financial Resource Strain (CARDIA)    Difficulty of Paying Living Expenses: Somewhat hard  Food Insecurity: Food Insecurity Present (06/27/2022)   Hunger Vital Sign    Worried About Running Out of Food in the Last Year: Sometimes true    Ran Out of Food in the Last Year: Never true  Transportation Needs: No Transportation Needs (06/27/2022)   PRAPARE - Hydrologist (Medical): No    Lack of Transportation (Non-Medical): No  Physical Activity: Inactive (10/10/2021)   Exercise Vital Sign    Days of Exercise per Week: 0 days    Minutes of Exercise per Session: 0 min  Stress: No Stress Concern Present (10/10/2021)   [place]    Feeling of Stress : Not at all  Social Connections: Moderately Integrated (10/10/2021)   Social Connection and Isolation Panel [NHANES]    Frequency of Communication with Friends and Family: More than three times a week    Frequency of Social Gatherings with Friends and Family: More than three times a week    Attends Religious Services: More than 4 times per year    Active Member of [employer] or Organizations: Yes    Attends Archivist Meetings: More than 4 times per year    Marital Status: Widowed     Allergies: No Known Allergies  Outpatient Meds: Current Outpatient Medications  Medication Sig Dispense Refill   abemaciclib (VERZENIO) 50 MG tablet Take 1 tablet (50 mg total) by mouth 2 (two) times daily. Swallow tablets whole. Do not chew, crush, or split tablets before swallowing. 60 tablet 6   albuterol (VENTOLIN HFA) 108 (90 Base) MCG/ACT inhaler Inhale 2 puffs into the lungs every 6 (six) hours as needed for wheezing or shortness of breath. 8 g 2   AMBULATORY NON FORMULARY MEDICATION Medication Name: Diltiazem 2%/ Lidocaine 5% Using your index finger, apply a small amount of medication inside the rectum up to your first knuckle/joint 4 daily x 8 weeks. 38 g 1   amoxicillin-clavulanate (AUGMENTIN) 875-125 MG tablet Take 1 tablet by mouth every  12 (twelve) hours. 14 tablet 0   anastrozole (ARIMIDEX) 1 MG tablet Take 1 tablet (1 mg total) by mouth daily. 90 tablet 1   azithromycin (ZITHROMAX) 250 MG tablet Take 1 tablet (250 mg total) by mouth daily. Take first 2 tablets together, then 1 every day until finished. 6 tablet 0   busPIRone (BUSPAR) 15 MG tablet Take 15 mg by mouth 3 (three) times daily.     doxepin (SINEQUAN) 75 MG capsule Take 75 mg by mouth at bedtime.     doxycycline (VIBRAMYCIN) 100 MG capsule Take 1 capsule (100 mg total) by mouth 2 (two) times daily. 20 capsule 0   ferrous sulfate 324 MG TBEC Take 1 tablet (324 mg total) by mouth every other day. 30 tablet 1   Fulvestrant (FASLODEX IM) Inject 2 Doses into the muscle every 14 (fourteen) days. Every other Tuesday     gabapentin (NEURONTIN) 300 MG capsule Take 1 capsule (300 mg total) by mouth at bedtime. 30 capsule 3   hydrocortisone (ANUSOL-HC) 25 MG suppository Use every other night for 2-weeks until prescription is complete 7 suppository 0   lidocaine-prilocaine (EMLA) cream Apply 1 application topically as needed. (Patient taking differently: Apply 1 application  topically daily as needed (port access).) 30 g 0    montelukast (SINGULAIR) 10 MG tablet Take 1 tablet (10 mg total) by mouth at bedtime. (Patient taking differently: Take 10 mg by mouth daily as needed (allergies).) 30 tablet 3   ondansetron (ZOFRAN) 4 MG tablet Take 1 tablet (4 mg total) by mouth every 8 (eight) hours as needed for nausea or vomiting. 20 tablet 0   pantoprazole (PROTONIX) 40 MG tablet Take 1 tablet (40 mg total) by mouth daily. 30 tablet 0   PE-Doxylamine-DM-GG-APAP (VICKS DAYQUIL/NYQUIL SEVERE PO) Take 30 mLs by mouth at bedtime as needed (cold symptoms).     prazosin (MINIPRESS) 1 MG capsule Take 1 mg by mouth at bedtime.     prochlorperazine (COMPAZINE) 5 MG tablet TAKE 1 TABLET (5 MG) BY MOUTH EVERY 6 HOURS AS NEEDED FOR NAUSEA OR VOMITING. MAY TAKE 2 TABLETS (10 mg) IF 5 MG NOT SUFFICIENT. (Patient taking differently: Take 5 mg by mouth every 6 (six) hours as needed for vomiting or nausea.) 30 tablet 2   Pseudoeph-CPM-DM-APAP (TYLENOL COLD & FLU DAY/NIGHT PO) Take 2 capsules by mouth at bedtime as needed (cold symptoms).     QUEtiapine (SEROQUEL) 400 MG tablet Take 400 mg by mouth at bedtime.      rosuvastatin (CRESTOR) 10 MG tablet Take 1 tablet (10 mg total) by mouth daily. 90 tablet 1   Tiotropium Bromide Monohydrate (SPIRIVA RESPIMAT) 1.25 MCG/ACT AERS Inhale 2 puffs into the lungs daily. 4 each 0   No current facility-administered medications for this visit.      ___________________________________________________________________ Objective   Exam:  LMP 07/13/2021 (Approximate)  Wt Readings from Last 3 Encounters:  11/28/22 270 lb (122.5 kg)  11/28/22 270 lb 9.6 oz (122.7 kg)  10/23/22 281 lb 8 oz (127.7 kg)    General: ***  Eyes: sclera anicteric, no redness ENT: oral mucosa moist without lesions, no cervical or supraclavicular lymphadenopathy CV: ***, no JVD, no peripheral edema Resp: clear to auscultation bilaterally, normal RR and effort noted GI: soft, *** tenderness, with active bowel sounds. No  guarding or palpable organomegaly noted. Skin; warm and dry, no rash or jaundice noted Neuro: awake, alert and oriented x 3. Normal gross motor function and fluent speech  Labs:  Latest Ref Rng & Units 11/28/2022   12:35 PM 11/20/2022    2:14 PM 10/23/2022    1:00 PM  CBC  WBC 4.0 - 10.5 K/uL 4.7  6.9  7.5   Hemoglobin 12.0 - 15.0 g/dL 11.1  11.3  12.1   Hematocrit 36.0 - 46.0 % 34.9  35.4  38.4   Platelets 150 - 400 K/uL 265  313  252    Iron/TIBC/Ferritin/ %Sat    Component Value Date/Time   IRON 28 08/30/2022 1357   IRON 20 (L) 03/07/2022 1443   TIBC 531 (H) 08/30/2022 1357   FERRITIN 5 (L) 08/30/2022 1359   FERRITIN 7 (L) 03/07/2022 1443   IRONPCTSAT 5 (L) 08/30/2022 1357     Colonoscopy:  hemorrhoids found on digital rectal exam. - Redundant colon requiring looping to reach the cecum. - Normal mucosa in the entire examined colon. - Non-bleeding non-thrombosed external and internal hemorrhoids  EGD: No gross lesions in esophagus. - Z-line irregular, 39 cm from the incisors. - 2 cm hiatal hernia. - Erythematous mucosa in the stomach. Biopsied. - No gross lesions in the duodenal bulb, in the first portion of the duodenum and in the second portion of the duodenum. Biopsied.  Assessment: No diagnosis found.  ***  Plan:  ***  Thank you for the courtesy of this consult.  Please call me with any questions or concerns.  Nelida Meuse III  CC: Referring provider noted above

## 2022-12-25 ENCOUNTER — Inpatient Hospital Stay: Payer: Medicare Other

## 2022-12-25 ENCOUNTER — Telehealth: Payer: Self-pay | Admitting: Hematology and Oncology

## 2022-12-25 ENCOUNTER — Inpatient Hospital Stay: Payer: Medicare Other | Admitting: Hematology and Oncology

## 2022-12-25 NOTE — Telephone Encounter (Signed)
Spoke with patient confirming upcoming appointment  

## 2022-12-27 ENCOUNTER — Other Ambulatory Visit: Payer: Self-pay

## 2022-12-27 ENCOUNTER — Inpatient Hospital Stay: Payer: Medicare Other | Attending: Oncology

## 2022-12-27 ENCOUNTER — Inpatient Hospital Stay: Payer: Medicare Other

## 2022-12-27 VITALS — BP 151/65 | HR 88 | Temp 98.3°F | Resp 18

## 2022-12-27 DIAGNOSIS — C50912 Malignant neoplasm of unspecified site of left female breast: Secondary | ICD-10-CM

## 2022-12-27 DIAGNOSIS — D509 Iron deficiency anemia, unspecified: Secondary | ICD-10-CM

## 2022-12-27 DIAGNOSIS — Z170 Estrogen receptor positive status [ER+]: Secondary | ICD-10-CM | POA: Insufficient documentation

## 2022-12-27 DIAGNOSIS — Z5111 Encounter for antineoplastic chemotherapy: Secondary | ICD-10-CM | POA: Diagnosis not present

## 2022-12-27 DIAGNOSIS — C50412 Malignant neoplasm of upper-outer quadrant of left female breast: Secondary | ICD-10-CM | POA: Insufficient documentation

## 2022-12-27 DIAGNOSIS — Z79811 Long term (current) use of aromatase inhibitors: Secondary | ICD-10-CM | POA: Diagnosis not present

## 2022-12-27 LAB — CBC WITH DIFFERENTIAL (CANCER CENTER ONLY)
Abs Immature Granulocytes: 0.03 10*3/uL (ref 0.00–0.07)
Basophils Absolute: 0.1 10*3/uL (ref 0.0–0.1)
Basophils Relative: 1 %
Eosinophils Absolute: 0.1 10*3/uL (ref 0.0–0.5)
Eosinophils Relative: 2 %
HCT: 32.8 % — ABNORMAL LOW (ref 36.0–46.0)
Hemoglobin: 10.4 g/dL — ABNORMAL LOW (ref 12.0–15.0)
Immature Granulocytes: 1 %
Lymphocytes Relative: 37 %
Lymphs Abs: 2.1 10*3/uL (ref 0.7–4.0)
MCH: 26.1 pg (ref 26.0–34.0)
MCHC: 31.7 g/dL (ref 30.0–36.0)
MCV: 82.4 fL (ref 80.0–100.0)
Monocytes Absolute: 0.7 10*3/uL (ref 0.1–1.0)
Monocytes Relative: 11 %
Neutro Abs: 2.8 10*3/uL (ref 1.7–7.7)
Neutrophils Relative %: 48 %
Platelet Count: 228 10*3/uL (ref 150–400)
RBC: 3.98 MIL/uL (ref 3.87–5.11)
RDW: 19.2 % — ABNORMAL HIGH (ref 11.5–15.5)
WBC Count: 5.8 10*3/uL (ref 4.0–10.5)
nRBC: 0 % (ref 0.0–0.2)

## 2022-12-27 LAB — CMP (CANCER CENTER ONLY)
ALT: 8 U/L (ref 0–44)
AST: 13 U/L — ABNORMAL LOW (ref 15–41)
Albumin: 4.2 g/dL (ref 3.5–5.0)
Alkaline Phosphatase: 68 U/L (ref 38–126)
Anion gap: 5 (ref 5–15)
BUN: 11 mg/dL (ref 6–20)
CO2: 30 mmol/L (ref 22–32)
Calcium: 9.5 mg/dL (ref 8.9–10.3)
Chloride: 105 mmol/L (ref 98–111)
Creatinine: 1.07 mg/dL — ABNORMAL HIGH (ref 0.44–1.00)
GFR, Estimated: >60 mL/min (ref >60)
Glucose, Bld: 93 mg/dL (ref 70–99)
Potassium: 3.9 mmol/L (ref 3.5–5.1)
Sodium: 140 mmol/L (ref 135–145)
Total Bilirubin: 0.2 mg/dL — ABNORMAL LOW (ref 0.3–1.2)
Total Protein: 7.7 g/dL (ref 6.5–8.1)

## 2022-12-27 LAB — IRON AND IRON BINDING CAPACITY (CC-WL,HP ONLY)
Iron: 19 ug/dL — ABNORMAL LOW (ref 28–170)
Saturation Ratios: 4 % — ABNORMAL LOW (ref 10.4–31.8)
TIBC: 510 ug/dL — ABNORMAL HIGH (ref 250–450)
UIBC: 491 ug/dL — ABNORMAL HIGH (ref 148–442)

## 2022-12-27 LAB — FERRITIN: Ferritin: 5 ng/mL — ABNORMAL LOW (ref 11–307)

## 2022-12-27 MED ORDER — FULVESTRANT 250 MG/5ML IM SOSY
500.0000 mg | PREFILLED_SYRINGE | Freq: Once | INTRAMUSCULAR | Status: AC
  Administered 2022-12-27: 500 mg via INTRAMUSCULAR
  Filled 2022-12-27: qty 10

## 2023-01-01 ENCOUNTER — Telehealth: Payer: Self-pay | Admitting: Hematology and Oncology

## 2023-01-01 NOTE — Telephone Encounter (Signed)
Left patient a vm regarding upcoming appointment  

## 2023-01-15 ENCOUNTER — Inpatient Hospital Stay (HOSPITAL_BASED_OUTPATIENT_CLINIC_OR_DEPARTMENT_OTHER): Payer: Medicare Other | Admitting: Hematology and Oncology

## 2023-01-15 ENCOUNTER — Other Ambulatory Visit: Payer: Self-pay

## 2023-01-15 VITALS — BP 125/65 | HR 99 | Temp 97.9°F | Resp 18 | Ht 68.0 in | Wt 283.5 lb

## 2023-01-15 DIAGNOSIS — C50412 Malignant neoplasm of upper-outer quadrant of left female breast: Secondary | ICD-10-CM | POA: Diagnosis not present

## 2023-01-15 DIAGNOSIS — Z5111 Encounter for antineoplastic chemotherapy: Secondary | ICD-10-CM | POA: Diagnosis not present

## 2023-01-15 DIAGNOSIS — Z170 Estrogen receptor positive status [ER+]: Secondary | ICD-10-CM | POA: Diagnosis not present

## 2023-01-15 DIAGNOSIS — D509 Iron deficiency anemia, unspecified: Secondary | ICD-10-CM | POA: Diagnosis not present

## 2023-01-15 DIAGNOSIS — C50912 Malignant neoplasm of unspecified site of left female breast: Secondary | ICD-10-CM | POA: Diagnosis not present

## 2023-01-15 DIAGNOSIS — Z79811 Long term (current) use of aromatase inhibitors: Secondary | ICD-10-CM | POA: Diagnosis not present
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
## 2023-01-15 NOTE — Assessment & Plan Note (Addendum)
This is a 52 year old female patient, BRCA negative status post left lumpectomy and sentinel biopsy for a T1CN0 stage Ia IDC back in July 2015, ER 83% positive, PR 66% positive and MIB of 14%, no HER2 amplification had adjuvant TC followed by radiation, did not take antiestrogen therapy who is now here with recurrent breast cancer status post left mastectomy and adjuvant radiation.  She is currently on abemaciclib and fulvestrant for adjuvant therapy given high risk for recurrence. Dr. Jana Hakim started her on abemaciclib, fulvestrant and anastrozole for adjuvant therapy. She is tolerating this combination well.   Severe IDA, had iron infusion. Last labs early March stable. Will continue to monitor.  Faslodex every 28 days. Last imaging Dec 2023, neg for metastatic disease. She is overdue for her mammogram, we have recommended several times to schedule this.  RTC in 8 weeks for follow up. Labs and faslodex every 4 weeks.

## 2023-01-23 ENCOUNTER — Other Ambulatory Visit: Payer: Self-pay

## 2023-01-23 DIAGNOSIS — C50912 Malignant neoplasm of unspecified site of left female breast: Secondary | ICD-10-CM

## 2023-01-23 DIAGNOSIS — D508 Other iron deficiency anemias: Secondary | ICD-10-CM

## 2023-01-24 ENCOUNTER — Inpatient Hospital Stay: Payer: Medicare Other

## 2023-01-24 ENCOUNTER — Inpatient Hospital Stay: Payer: Medicare Other | Attending: Oncology

## 2023-01-24 ENCOUNTER — Telehealth: Payer: Self-pay | Admitting: Family Medicine

## 2023-01-24 NOTE — Telephone Encounter (Signed)
Contacted [name] to schedule their annual wellness visit. Appointment made for 01/29/2023.  Thank you,  [place] Direct dial  [phone]

## 2023-01-29 ENCOUNTER — Encounter (HOSPITAL_BASED_OUTPATIENT_CLINIC_OR_DEPARTMENT_OTHER): Payer: Medicare Other

## 2023-01-29 ENCOUNTER — Telehealth: Payer: Self-pay

## 2023-01-29 NOTE — Telephone Encounter (Signed)
Unsuccessful attempt to reach patient on preferred number listed in notes for scheduled AWV. Left message on voicemail okay to reschedule. 

## 2023-02-21 ENCOUNTER — Other Ambulatory Visit: Payer: Self-pay | Admitting: *Deleted

## 2023-02-21 ENCOUNTER — Inpatient Hospital Stay: Payer: Medicare Other | Attending: Oncology

## 2023-02-21 ENCOUNTER — Inpatient Hospital Stay: Payer: Medicare Other

## 2023-02-21 DIAGNOSIS — D509 Iron deficiency anemia, unspecified: Secondary | ICD-10-CM | POA: Insufficient documentation

## 2023-02-21 DIAGNOSIS — C50412 Malignant neoplasm of upper-outer quadrant of left female breast: Secondary | ICD-10-CM | POA: Insufficient documentation

## 2023-02-21 DIAGNOSIS — Z5111 Encounter for antineoplastic chemotherapy: Secondary | ICD-10-CM | POA: Insufficient documentation

## 2023-02-21 DIAGNOSIS — Z170 Estrogen receptor positive status [ER+]: Secondary | ICD-10-CM | POA: Insufficient documentation

## 2023-03-21 ENCOUNTER — Other Ambulatory Visit: Payer: Self-pay | Admitting: *Deleted

## 2023-03-21 ENCOUNTER — Inpatient Hospital Stay: Payer: Medicare Other

## 2023-03-21 ENCOUNTER — Inpatient Hospital Stay (HOSPITAL_BASED_OUTPATIENT_CLINIC_OR_DEPARTMENT_OTHER): Payer: Medicare Other | Admitting: Hematology and Oncology

## 2023-03-21 VITALS — BP 174/84 | HR 100 | Temp 97.9°F | Resp 19 | Ht 68.0 in | Wt 280.4 lb

## 2023-03-21 DIAGNOSIS — D509 Iron deficiency anemia, unspecified: Secondary | ICD-10-CM

## 2023-03-21 DIAGNOSIS — Z170 Estrogen receptor positive status [ER+]: Secondary | ICD-10-CM | POA: Diagnosis not present

## 2023-03-21 DIAGNOSIS — C50412 Malignant neoplasm of upper-outer quadrant of left female breast: Secondary | ICD-10-CM | POA: Diagnosis not present

## 2023-03-21 DIAGNOSIS — C50912 Malignant neoplasm of unspecified site of left female breast: Secondary | ICD-10-CM

## 2023-03-21 DIAGNOSIS — D508 Other iron deficiency anemias: Secondary | ICD-10-CM

## 2023-03-21 DIAGNOSIS — N951 Menopausal and female climacteric states: Secondary | ICD-10-CM

## 2023-03-21 DIAGNOSIS — Z5111 Encounter for antineoplastic chemotherapy: Secondary | ICD-10-CM | POA: Diagnosis not present

## 2023-03-21 LAB — CMP (CANCER CENTER ONLY)
ALT: 5 U/L (ref 0–44)
AST: 10 U/L — ABNORMAL LOW (ref 15–41)
Albumin: 4.4 g/dL (ref 3.5–5.0)
Alkaline Phosphatase: 73 U/L (ref 38–126)
Anion gap: 5 (ref 5–15)
BUN: 8 mg/dL (ref 6–20)
CO2: 29 mmol/L (ref 22–32)
Calcium: 9.6 mg/dL (ref 8.9–10.3)
Chloride: 105 mmol/L (ref 98–111)
Creatinine: 1.01 mg/dL — ABNORMAL HIGH (ref 0.44–1.00)
GFR, Estimated: >60 mL/min (ref >60)
Glucose, Bld: 114 mg/dL — ABNORMAL HIGH (ref 70–99)
Potassium: 4 mmol/L (ref 3.5–5.1)
Sodium: 139 mmol/L (ref 135–145)
Total Bilirubin: 0.3 mg/dL (ref 0.3–1.2)
Total Protein: 8.1 g/dL (ref 6.5–8.1)

## 2023-03-21 LAB — CBC WITH DIFFERENTIAL (CANCER CENTER ONLY)
Abs Immature Granulocytes: 0.02 10*3/uL (ref 0.00–0.07)
Basophils Absolute: 0 10*3/uL (ref 0.0–0.1)
Basophils Relative: 1 %
Eosinophils Absolute: 0 10*3/uL (ref 0.0–0.5)
Eosinophils Relative: 0 %
HCT: 30.9 % — ABNORMAL LOW (ref 36.0–46.0)
Hemoglobin: 9.1 g/dL — ABNORMAL LOW (ref 12.0–15.0)
Immature Granulocytes: 0 %
Lymphocytes Relative: 41 %
Lymphs Abs: 2.1 10*3/uL (ref 0.7–4.0)
MCH: 21.5 pg — ABNORMAL LOW (ref 26.0–34.0)
MCHC: 29.4 g/dL — ABNORMAL LOW (ref 30.0–36.0)
MCV: 73 fL — ABNORMAL LOW (ref 80.0–100.0)
Monocytes Absolute: 0.5 10*3/uL (ref 0.1–1.0)
Monocytes Relative: 9 %
Neutro Abs: 2.6 10*3/uL (ref 1.7–7.7)
Neutrophils Relative %: 49 %
Platelet Count: 282 10*3/uL (ref 150–400)
RBC: 4.23 MIL/uL (ref 3.87–5.11)
RDW: 17.6 % — ABNORMAL HIGH (ref 11.5–15.5)
WBC Count: 5.3 10*3/uL (ref 4.0–10.5)
nRBC: 0 % (ref 0.0–0.2)

## 2023-03-21 LAB — IRON AND IRON BINDING CAPACITY (CC-WL,HP ONLY)
Iron: 9 ug/dL — ABNORMAL LOW (ref 28–170)
Saturation Ratios: 2 % — ABNORMAL LOW (ref 10.4–31.8)
TIBC: 521 ug/dL — ABNORMAL HIGH (ref 250–450)
UIBC: 512 ug/dL — ABNORMAL HIGH (ref 148–442)

## 2023-03-21 LAB — FERRITIN: Ferritin: 4 ng/mL — ABNORMAL LOW (ref 11–307)

## 2023-03-21 MED ORDER — OXYBUTYNIN CHLORIDE 5 MG PO TABS: 2.5000 mg | ORAL_TABLET | Freq: Every day | ORAL | 0 refills | Status: DC

## 2023-03-21 MED ORDER — PROCHLORPERAZINE MALEATE 5 MG PO TABS: 5.0000 mg | ORAL_TABLET | Freq: Four times a day (QID) | ORAL | 1 refills | Status: DC | PRN

## 2023-03-21 MED ORDER — FULVESTRANT 250 MG/5ML IM SOSY
500.0000 mg | PREFILLED_SYRINGE | Freq: Once | INTRAMUSCULAR | Status: AC
  Administered 2023-03-21: 500 mg via INTRAMUSCULAR
  Filled 2023-03-21: qty 10

## 2023-03-21 MED ORDER — OXYBUTYNIN CHLORIDE 5 MG PO TABS
2.5000 mg | ORAL_TABLET | Freq: Once | ORAL | Status: DC
  Filled 2023-03-21: qty 0.5
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
## 2023-03-22 ENCOUNTER — Other Ambulatory Visit: Payer: Self-pay | Admitting: *Deleted

## 2023-03-25 ENCOUNTER — Other Ambulatory Visit: Payer: Self-pay

## 2023-03-25 MED ORDER — ONDANSETRON HCL 8 MG PO TABS: 8.0000 mg | ORAL_TABLET | Freq: Three times a day (TID) | ORAL | 3 refills | Status: DC | PRN

## 2023-03-25 NOTE — Telephone Encounter (Signed)
Called Pt to clarify ondansetron rx. Pt states that Verzenio and anastrozole cause nausea. Rx refilled per MD.

## 2023-03-26 ENCOUNTER — Encounter: Payer: Self-pay | Admitting: Adult Health

## 2023-03-30 ENCOUNTER — Other Ambulatory Visit: Payer: Self-pay | Admitting: Hematology and Oncology

## 2023-03-30 NOTE — Progress Notes (Unsigned)
Pt doesn't want to take oral iron, hence IV venofer plan placed. Message sent to scheduling to have these appointments scheduled.  Kimberly [name]

## 2023-04-03 ENCOUNTER — Telehealth: Payer: Self-pay | Admitting: Hematology and Oncology

## 2023-04-03 NOTE — Telephone Encounter (Signed)
Spoke with patient confirming upcoming appointments  

## 2023-04-04 ENCOUNTER — Telehealth: Payer: Self-pay

## 2023-04-04 ENCOUNTER — Inpatient Hospital Stay: Payer: Medicare Other | Attending: Oncology

## 2023-04-04 VITALS — BP 136/80 | HR 71 | Temp 98.2°F | Resp 18

## 2023-04-04 DIAGNOSIS — Z5111 Encounter for antineoplastic chemotherapy: Secondary | ICD-10-CM | POA: Insufficient documentation

## 2023-04-04 DIAGNOSIS — D509 Iron deficiency anemia, unspecified: Secondary | ICD-10-CM | POA: Insufficient documentation

## 2023-04-04 DIAGNOSIS — C50412 Malignant neoplasm of upper-outer quadrant of left female breast: Secondary | ICD-10-CM | POA: Insufficient documentation

## 2023-04-04 MED ORDER — SODIUM CHLORIDE 0.9 % IV SOLN
300.0000 mg | Freq: Once | INTRAVENOUS | Status: AC
  Administered 2023-04-04: 300 mg via INTRAVENOUS
  Filled 2023-04-04: qty 300

## 2023-04-04 MED ORDER — HEPARIN SOD (PORK) LOCK FLUSH 100 UNIT/ML IV SOLN
500.0000 [IU] | INTRAVENOUS | Status: AC | PRN
  Administered 2023-04-04: 500 [IU]

## 2023-04-04 MED ORDER — SODIUM CHLORIDE 0.9 % IV SOLN: Freq: Once | INTRAVENOUS | Status: AC

## 2023-04-04 MED ORDER — SODIUM CHLORIDE 0.9% FLUSH
10.0000 mL | INTRAVENOUS | Status: AC | PRN
  Administered 2023-04-04: 10 mL

## 2023-04-04 NOTE — Progress Notes (Signed)
Patient seen today for Venofer 300 mg Infusion. She verbalized that she was not aware the infusion was for 90 minutes and that she usually gets her Iron infusions for 15 minutes. This Clinical research associate explained to patient that she is getting a higher dose today (previous doses were 200 mg) which is infused over 90 minutes. Patient expressed that she would have liked to know about this before hand so as to plan her activities for the day. She then requested to come in at an earlier time for her next appointments. Scheduling message was sent via secure chat to Ladona Ridgel to reschedule her appointment on 04/11/23 and 04/18/23 for 0800. Patient is aware of this new time and agreeable. After infusion today, patient declined to be observed for 30 minutes insisting that she was fine and usually leaves immediately after. This Clinical research associate provided a verbal education on the importance of post iron infusion observation patient verbalized understanding and insisted on leaving because she had a lot to go take off. Appt schedule printed and provided to patient today. She was stable and without concerns at time of discharge. Vitals WNL and documented on flowsheet.

## 2023-04-04 NOTE — Patient Instructions (Signed)

## 2023-04-04 NOTE — Telephone Encounter (Signed)
Returned [name] call and LVM regarding rescheduling iron infusion appts. Pt states she only wants early AM appts but also ASAP as she is feeling weak. Per Leonette Most, [facility] had sooner availability than [facility]. Messaged [facility] scheduler to call Pt to reschedule. Advised Pt that early AM appts may not be available and asked for call back to discuss current sx.

## 2023-04-11 ENCOUNTER — Inpatient Hospital Stay: Payer: Medicare Other

## 2023-04-11 VITALS — BP 135/74 | HR 82 | Temp 98.1°F | Resp 18

## 2023-04-11 DIAGNOSIS — D509 Iron deficiency anemia, unspecified: Secondary | ICD-10-CM

## 2023-04-11 DIAGNOSIS — Z5111 Encounter for antineoplastic chemotherapy: Secondary | ICD-10-CM | POA: Diagnosis not present

## 2023-04-11 DIAGNOSIS — C50412 Malignant neoplasm of upper-outer quadrant of left female breast: Secondary | ICD-10-CM | POA: Diagnosis not present

## 2023-04-11 MED ORDER — HEPARIN SOD (PORK) LOCK FLUSH 100 UNIT/ML IV SOLN
500.0000 [IU] | Freq: Once | INTRAVENOUS | Status: AC | PRN
  Administered 2023-04-11: 500 [IU]

## 2023-04-11 MED ORDER — SODIUM CHLORIDE 0.9% FLUSH
10.0000 mL | Freq: Once | INTRAVENOUS | Status: AC | PRN
  Administered 2023-04-11: 10 mL

## 2023-04-11 MED ORDER — SODIUM CHLORIDE 0.9 % IV SOLN: Freq: Once | INTRAVENOUS | Status: AC

## 2023-04-11 MED ORDER — SODIUM CHLORIDE 0.9 % IV SOLN
300.0000 mg | Freq: Once | INTRAVENOUS | Status: AC
  Administered 2023-04-11: 300 mg via INTRAVENOUS
  Filled 2023-04-11: qty 300

## 2023-04-11 NOTE — Patient Instructions (Signed)

## 2023-04-11 NOTE — Progress Notes (Signed)
Patient declined to stay for 30 min post infusion observation period.  Patient given verbal education of importance of staying for post observation and what symptoms would warrant a need for patient to seek medical attention.  VSS upon leaving infusion room.

## 2023-04-16 ENCOUNTER — Other Ambulatory Visit: Payer: Self-pay | Admitting: Hematology and Oncology

## 2023-04-18 ENCOUNTER — Telehealth: Payer: Self-pay | Admitting: *Deleted

## 2023-04-18 ENCOUNTER — Inpatient Hospital Stay: Payer: Medicare Other

## 2023-04-18 ENCOUNTER — Other Ambulatory Visit: Payer: Self-pay | Admitting: *Deleted

## 2023-04-18 VITALS — BP 142/74 | HR 103 | Temp 98.2°F | Resp 20 | Ht 68.0 in | Wt 268.1 lb

## 2023-04-18 DIAGNOSIS — D509 Iron deficiency anemia, unspecified: Secondary | ICD-10-CM

## 2023-04-18 DIAGNOSIS — Z5111 Encounter for antineoplastic chemotherapy: Secondary | ICD-10-CM | POA: Diagnosis not present

## 2023-04-18 DIAGNOSIS — D508 Other iron deficiency anemias: Secondary | ICD-10-CM

## 2023-04-18 DIAGNOSIS — C50412 Malignant neoplasm of upper-outer quadrant of left female breast: Secondary | ICD-10-CM | POA: Diagnosis not present

## 2023-04-18 MED ORDER — SODIUM CHLORIDE 0.9% FLUSH: 3.0000 mL | Freq: Once | INTRAVENOUS | Status: DC | PRN

## 2023-04-18 MED ORDER — SODIUM CHLORIDE 0.9 % IV SOLN
300.0000 mg | Freq: Once | INTRAVENOUS | Status: AC
  Administered 2023-04-18: 300 mg via INTRAVENOUS
  Filled 2023-04-18: qty 300

## 2023-04-18 MED ORDER — FULVESTRANT 250 MG/5ML IM SOSY
500.0000 mg | PREFILLED_SYRINGE | Freq: Once | INTRAMUSCULAR | Status: AC
  Administered 2023-04-18: 500 mg via INTRAMUSCULAR
  Filled 2023-04-18: qty 10

## 2023-04-18 MED ORDER — HEPARIN SOD (PORK) LOCK FLUSH 100 UNIT/ML IV SOLN
500.0000 [IU] | Freq: Once | INTRAVENOUS | Status: AC
  Administered 2023-04-18: 500 [IU] via INTRAVENOUS

## 2023-04-18 MED ORDER — SODIUM CHLORIDE 0.9% FLUSH
10.0000 mL | Freq: Once | INTRAVENOUS | Status: AC
  Administered 2023-04-18: 10 mL via INTRAVENOUS

## 2023-04-18 MED ORDER — HEPARIN SOD (PORK) LOCK FLUSH 100 UNIT/ML IV SOLN: 250.0000 [IU] | Freq: Once | INTRAVENOUS | Status: DC | PRN

## 2023-04-18 MED ORDER — SODIUM CHLORIDE 0.9 % IV SOLN: Freq: Once | INTRAVENOUS | Status: AC

## 2023-04-18 NOTE — Progress Notes (Signed)
Pt declined post iron observation. Pt was anxious expressing her need to leave because she was concerned that her son was going to be late for work. Pt was to have a CBC drawn post iron but this RN could not get it drawn. VSS. Patient discharged post faslodex injection. Pt was reminded that she has an appointment on 06/24/23 at 11:00 with [facility] and on cancellation list for earlier appointment if possible. Pt advised to proceed to Urgent care or ER for GI bleeds and symptoms if they continue per Rona Ravens, RN.  Rona Ravens, RN notified that CBC was not obtained.

## 2023-04-18 NOTE — Telephone Encounter (Signed)
This RN was able to speak with the patient later this afternoon about need to check heme level due to her reported symptoms of blood in her stool and dizziness.  Pt did not stay at [facility] post iron nor to get blood checked.  Of note pt states she would prefer to obtain labs and infusions at this office due to known familiarness "I got bipolar and schizophrenia"- she stated issues today that caused her emotional distress.  This RN discussed above with patient with plan to place note on chart per above regarding getting IV/inj/labs etc at this office only.  Per discussion pt will come in the morning at 10 am for lab recheck and nurse review with pt.

## 2023-04-18 NOTE — Telephone Encounter (Signed)
Per communication from RN who is treating pt today for cycle 3 venofer- note compliants of continued blood in stool with dizziness.  Pt was to follow up with GI MD per these symptoms have been ongoing - pt had mentioned to this RN in previous discussion that she is reluctant to return to GI due to "they mentioned surgery and I just do not want to do that ".  This RN called [facility]- obtained next opening which is 06/24/2023 at 11 am with [facility] PA. Informed if feels she is having symptoms more severe she should proceed to an Urgent Care or the ER.  This RN informed treating nurse of above and request to draw CBC to monitor heme for further recommendations.  No change in treatment plan for today.  Will continue to follow.

## 2023-04-18 NOTE — Patient Instructions (Signed)
[facility]   Discharge Instructions: Thank you for choosing [facility] to provide your oncology and hematology care.   If you have a lab appointment with the Cancer Center, please go directly to the Cancer Center and check in at the registration area.   Wear comfortable clothing and clothing appropriate for easy access to any Portacath or PICC line.   We strive to give you quality time with your provider. You may need to reschedule your appointment if you arrive late (15 or more minutes).  Arriving late affects you and other patients whose appointments are after yours.  Also, if you miss three or more appointments without notifying the office, you may be dismissed from the clinic at the provider's discretion.      For prescription refill requests, have your pharmacy contact our office and allow 72 hours for refills to be completed.    Today you received the following chemotherapy and/or immunotherapy agents Faslodex.      To help prevent nausea and vomiting after your treatment, we encourage you to take your nausea medication as directed.  BELOW ARE SYMPTOMS THAT SHOULD BE REPORTED IMMEDIATELY: *FEVER GREATER THAN 100.4 F (38 C) OR HIGHER *CHILLS OR SWEATING *NAUSEA AND VOMITING THAT IS NOT CONTROLLED WITH YOUR NAUSEA MEDICATION *UNUSUAL SHORTNESS OF BREATH *UNUSUAL BRUISING OR BLEEDING *URINARY PROBLEMS (pain or burning when urinating, or frequent urination) *BOWEL PROBLEMS (unusual diarrhea, constipation, pain near the anus) TENDERNESS IN MOUTH AND THROAT WITH OR WITHOUT PRESENCE OF ULCERS (sore throat, sores in mouth, or a toothache) UNUSUAL RASH, SWELLING OR PAIN  UNUSUAL VAGINAL DISCHARGE OR ITCHING   Items with * indicate a potential emergency and should be followed up as soon as possible or go to the Emergency Department if any problems should occur.  Please show the CHEMOTHERAPY ALERT CARD or IMMUNOTHERAPY ALERT CARD at  check-in to the Emergency Department and triage nurse.  Should you have questions after your visit or need to cancel or reschedule your appointment, please contact [facility]  Dept: [phone]  and follow the prompts.  Office hours are 8:00 a.m. to 4:30 p.m. Monday - Friday. Please note that voicemails left after 4:00 p.m. may not be returned until the following business day.  We are closed weekends and major holidays. You have access to a nurse at all times for urgent questions. Please call the main number to the clinic Dept: [phone] and follow the prompts.   For any non-urgent questions, you may also contact your provider using MyChart. We now offer e-Visits for anyone 4 and older to request care online for non-urgent symptoms. For details visit [email or web address].   Also download the MyChart app! Go to the app store, search "MyChart", open the app, select [place], and log in with your MyChart username and password.  Fulvestrant Injection What is this medication? FULVESTRANT (ful VES trant) treats breast cancer. It works by blocking the hormone estrogen in breast tissue, which prevents breast cancer cells from spreading or growing. This medicine may be used for other purposes; ask your health care provider or pharmacist if you have questions. COMMON BRAND NAME(S): FASLODEX What should I tell my care team before I take this medication? They need to know if you have any of these conditions: Bleeding disorder Liver disease Low blood cell levels, such as low white cells, red cells, and platelets An unusual or allergic reaction to fulvestrant, other medications, foods, dyes, or  preservatives Pregnant or trying to get pregnant Breast-feeding How should I use this medication? This medication is injected into a muscle. It is given by your care team in a hospital or clinic setting. Talk to your care team about the use of this medication in  children. Special care may be needed. Overdosage: If you think you have taken too much of this medicine contact a poison control center or emergency room at once. NOTE: This medicine is only for you. Do not share this medicine with others. What if I miss a dose? Keep appointments for follow-up doses. It is important not to miss your dose. Call your care team if you are unable to keep an appointment. What may interact with this medication? Certain medications that prevent or treat blood clots, such as warfarin, enoxaparin, dalteparin, apixaban, dabigatran, rivaroxaban This list may not describe all possible interactions. Give your health care provider a list of all the medicines, herbs, non-prescription drugs, or dietary supplements you use. Also tell them if you smoke, drink alcohol, or use illegal drugs. Some items may interact with your medicine. What should I watch for while using this medication? Your condition will be monitored carefully while you are receiving this medication. You may need blood work while taking this medication. Talk to your care team if you may be pregnant. Serious birth defects can occur if you take this medication during pregnancy and for 1 year after the last dose. You will need a negative pregnancy test before starting this medication. Contraception is recommended while taking this medication and for 1 year after the last dose. Your care team can help you find the option that works for you. Do not breastfeed while taking this medication and for 1 year after the last dose. This medication may cause infertility. Talk to your care team if you are concerned about your fertility. What side effects may I notice from receiving this medication? Side effects that you should report to your care team as soon as possible: Allergic reactions or angioedema--skin rash, itching or hives, swelling of the face, eyes, lips, tongue, arms, or legs, trouble swallowing or breathing Pain,  tingling, or numbness in the hands or feet Side effects that usually do not require medical attention (report to your care team if they continue or are bothersome): Bone, joint, or muscle pain Constipation Headache Hot flashes Nausea Pain, redness, or irritation at injection site Unusual weakness or fatigue This list may not describe all possible side effects. Call your doctor for medical advice about side effects. You may report side effects to FDA at 1-800-FDA-1088. Where should I keep my medication? This medication is given in a hospital or clinic. It will not be stored at home. NOTE: This sheet is a summary. It may not cover all possible information. If you have questions about this medicine, talk to your doctor, pharmacist, or health care provider.  2024 Elsevier/Gold Standard (2022-02-20 00:00:00)  Iron Sucrose Injection What is this medication? IRON SUCROSE (EYE ern SOO krose) treats low levels of iron (iron deficiency anemia) in people with kidney disease. Iron is a mineral that plays an important role in making red blood cells, which carry oxygen from your lungs to the rest of your body. This medicine may be used for other purposes; ask your health care provider or pharmacist if you have questions. COMMON BRAND NAME(S): Venofer What should I tell my care team before I take this medication? They need to know if you have any of these conditions: Anemia  not caused by low iron levels Heart disease High levels of iron in the blood Kidney disease Liver disease An unusual or allergic reaction to iron, other medications, foods, dyes, or preservatives Pregnant or trying to get pregnant Breastfeeding How should I use this medication? This medication is for infusion into a vein. It is given in a hospital or clinic setting. Talk to your care team about the use of this medication in children. While this medication may be prescribed for children as young as 2 years for selected  conditions, precautions do apply. Overdosage: If you think you have taken too much of this medicine contact a poison control center or emergency room at once. NOTE: This medicine is only for you. Do not share this medicine with others. What if I miss a dose? Keep appointments for follow-up doses. It is important not to miss your dose. Call your care team if you are unable to keep an appointment. What may interact with this medication? Do not take this medication with any of the following: Deferoxamine Dimercaprol Other iron products This medication may also interact with the following: Chloramphenicol Deferasirox This list may not describe all possible interactions. Give your health care provider a list of all the medicines, herbs, non-prescription drugs, or dietary supplements you use. Also tell them if you smoke, drink alcohol, or use illegal drugs. Some items may interact with your medicine. What should I watch for while using this medication? Visit your care team regularly. Tell your care team if your symptoms do not start to get better or if they get worse. You may need blood work done while you are taking this medication. You may need to follow a special diet. Talk to your care team. Foods that contain iron include: whole grains/cereals, dried fruits, beans, or peas, leafy green vegetables, and organ meats (liver, kidney). What side effects may I notice from receiving this medication? Side effects that you should report to your care team as soon as possible: Allergic reactions--skin rash, itching, hives, swelling of the face, lips, tongue, or throat Low blood pressure--dizziness, feeling faint or lightheaded, blurry vision Shortness of breath Side effects that usually do not require medical attention (report to your care team if they continue or are bothersome): Flushing Headache Joint pain Muscle pain Nausea Pain, redness, or irritation at injection site This list may not describe  all possible side effects. Call your doctor for medical advice about side effects. You may report side effects to FDA at 1-800-FDA-1088. Where should I keep my medication? This medication is given in a hospital or clinic and will not be stored at home. NOTE: This sheet is a summary. It may not cover all possible information. If you have questions about this medicine, talk to your doctor, pharmacist, or health care provider.  2024 Elsevier/Gold Standard (2022-04-18 00:00:00)

## 2023-04-18 NOTE — Progress Notes (Signed)
Reported to Dr Al Pimple office: Good morning! Patient is here for Venofer and Faslodex today. Patient has had a significant loss of weight 280.6 to 268.2. Pt reports that she has blood in her stool, diarrhea and vomiting.and not voiding as she normally does. Pt reports that she is unable to keep water down. She also stated that she feels like she could pass out. Vital signs are within normal limits. I am going to go ahead and start her iron. Can you please let me know if I can give the Faslodex and if anything else is needed for this patient.  Ok to treat received from Rona Ravens, Charity fundraiser.

## 2023-04-19 ENCOUNTER — Inpatient Hospital Stay: Payer: Medicare Other

## 2023-04-19 ENCOUNTER — Other Ambulatory Visit: Payer: Self-pay

## 2023-04-19 ENCOUNTER — Other Ambulatory Visit: Payer: Self-pay | Admitting: Hematology and Oncology

## 2023-04-19 DIAGNOSIS — D509 Iron deficiency anemia, unspecified: Secondary | ICD-10-CM | POA: Diagnosis not present

## 2023-04-19 DIAGNOSIS — D508 Other iron deficiency anemias: Secondary | ICD-10-CM

## 2023-04-19 DIAGNOSIS — Z5111 Encounter for antineoplastic chemotherapy: Secondary | ICD-10-CM | POA: Diagnosis not present

## 2023-04-19 DIAGNOSIS — C50412 Malignant neoplasm of upper-outer quadrant of left female breast: Secondary | ICD-10-CM | POA: Diagnosis not present

## 2023-04-19 LAB — CBC WITH DIFFERENTIAL (CANCER CENTER ONLY)
Abs Immature Granulocytes: 0.01 10*3/uL (ref 0.00–0.07)
Basophils Absolute: 0 10*3/uL (ref 0.0–0.1)
Basophils Relative: 0 %
Eosinophils Absolute: 0 10*3/uL (ref 0.0–0.5)
Eosinophils Relative: 0 %
HCT: 33.7 % — ABNORMAL LOW (ref 36.0–46.0)
Hemoglobin: 10.1 g/dL — ABNORMAL LOW (ref 12.0–15.0)
Immature Granulocytes: 0 %
Lymphocytes Relative: 36 %
Lymphs Abs: 1.7 10*3/uL (ref 0.7–4.0)
MCH: 22.6 pg — ABNORMAL LOW (ref 26.0–34.0)
MCHC: 30 g/dL (ref 30.0–36.0)
MCV: 75.6 fL — ABNORMAL LOW (ref 80.0–100.0)
Monocytes Absolute: 0.4 10*3/uL (ref 0.1–1.0)
Monocytes Relative: 8 %
Neutro Abs: 2.6 10*3/uL (ref 1.7–7.7)
Neutrophils Relative %: 56 %
Platelet Count: 288 10*3/uL (ref 150–400)
RBC: 4.46 MIL/uL (ref 3.87–5.11)
RDW: 26.2 % — ABNORMAL HIGH (ref 11.5–15.5)
WBC Count: 4.7 10*3/uL (ref 4.0–10.5)
nRBC: 0 % (ref 0.0–0.2)

## 2023-04-21 ENCOUNTER — Encounter: Payer: Self-pay | Admitting: Adult Health

## 2023-04-24 ENCOUNTER — Ambulatory Visit (INDEPENDENT_AMBULATORY_CARE_PROVIDER_SITE_OTHER): Payer: Medicare Other | Admitting: Family Medicine

## 2023-04-24 ENCOUNTER — Encounter: Payer: Self-pay | Admitting: Family Medicine

## 2023-04-24 VITALS — BP 138/72 | HR 60 | Ht 69.0 in | Wt 266.2 lb

## 2023-04-24 DIAGNOSIS — M25562 Pain in left knee: Secondary | ICD-10-CM

## 2023-04-24 DIAGNOSIS — M25561 Pain in right knee: Secondary | ICD-10-CM

## 2023-04-24 DIAGNOSIS — D508 Other iron deficiency anemias: Secondary | ICD-10-CM | POA: Diagnosis not present

## 2023-04-24 DIAGNOSIS — G8929 Other chronic pain: Secondary | ICD-10-CM | POA: Diagnosis not present

## 2023-04-24 DIAGNOSIS — G620 Drug-induced polyneuropathy: Secondary | ICD-10-CM | POA: Diagnosis not present

## 2023-04-24 MED ORDER — FERROUS SULFATE 324 MG PO TBEC: 324.0000 mg | DELAYED_RELEASE_TABLET | Freq: Every day | ORAL | 3 refills | Status: DC

## 2023-04-24 MED ORDER — PREGABALIN 25 MG PO CAPS
25.0000 mg | ORAL_CAPSULE | Freq: Every evening | ORAL | 0 refills | Status: DC
Start: 2023-04-24 — End: 2024-06-08

## 2023-04-24 NOTE — Progress Notes (Signed)
    SUBJECTIVE:   CHIEF COMPLAINT / HPI:   BB is a 52yo F w/ hx of recurrent L breast cancer s/p L mastectomy and now on chemo, obesity, chronic knee pain, IDA that presents for neuropathic pain and BL knee pain.   Knee Pain - Reports acute worsening of BL knee pain, R side much worse than L.  -Previously received steroid injections which was helpful.  Patient is requesting another steroid injection.  Neuropathic Pain - Having a lot of neuropathic pain in hands and feet from getting chemo. She get Verzenio, [place], and faslodex. - tylenol and ibuprofen not helping  - previously on gabapentin, but it made the pain worse. She stopped taking it.  - Previously, diazepam and flexeril not helping this pain.   Anemia - Was found to have severely low iron and was started on iron infusions with her chemo infusion - Not currently on PO iron, needs refill  OBJECTIVE:   BP 138/72   Pulse 60   Ht 5\' 9"  (1.753 m)   Wt 266 lb 3.2 oz (120.7 kg)   LMP 07/13/2021 (Approximate)   SpO2 99%   BMI 39.31 kg/m   General: Alert, pleasant woman. NAD. HEENT: NCAT. MMM. CV: RRR, no murmurs.  Resp: CTAB, no wheezing or crackles. Normal WOB on RA.  Abm: Soft, nontender, nondistended. BS present. Skin: Warm, well perfused  MSK: Tenderness to palpation of lateral aspect of BL knees (R>L).  Knees appeared symmetric, no gross effusion.  ASSESSMENT/PLAN:   Peripheral neuropathy Chronic uncontrolled neuropathic pain in bilateral hands and feet since starting chemotherapy.  Has tried Tylenol, ibuprofen, diazepam, Flexeril, gabapentin without benefit.  Patient is requesting medication to help with pain management.  Patient is receiving chemotherapy for recurrent left breast cancer including Verzenio, anastrozole, Faslodex. - Start pregabalin 25 mg nightly  ANEMIA, IRON DEFICIENCY, CHRONIC Is receiving IV iron infusions with her chemo infusions.  Not currently taking p.o. iron supplement. -Start daily p.o.  ferrous sulfate supplement  Chronic pain of both knees Chronic bilateral knee pain, previously thought to be most likely osteoarthritis.  Has been previously treated with intra-articular steroid injections and reports improvement.  Patient is requesting another injection.  Currently right knee is much more painful than left knee.  Additionally patient is agreeable to starting physical therapy. -Return in 1 week for steroid knee injection -Referral to physical therapy   [name], MD [facility]

## 2023-04-24 NOTE — Patient Instructions (Addendum)
Good to see you today - Thank you for coming in  Things we discussed today:  1) For your knee pain - Come back in 1 week to get a knee injection - I will send a referral for you to see Physical Therapy. They can help you with exercises to strengthen your knee muscles so your bones aren't as stressed.  2) For your neuropathic pain - Start taking Lyrica (pregabalin) 25mg  once a day at bedtime. This can help control the pain.  3) For your anemia - Start taking an iron supplement once a day with meals  Please always bring your medication bottles  Come back to see me in 1 week

## 2023-04-26 DIAGNOSIS — G629 Polyneuropathy, unspecified: Secondary | ICD-10-CM | POA: Insufficient documentation

## 2023-04-26 NOTE — Assessment & Plan Note (Signed)
Chronic uncontrolled neuropathic pain in bilateral hands and feet since starting chemotherapy.  Has tried Tylenol, ibuprofen, diazepam, Flexeril, gabapentin without benefit.  Patient is requesting medication to help with pain management.  Patient is receiving chemotherapy for recurrent left breast cancer including Verzenio, anastrozole, Faslodex. - Start pregabalin 25 mg nightly

## 2023-04-26 NOTE — Assessment & Plan Note (Signed)
Is receiving IV iron infusions with her chemo infusions.  Not currently taking p.o. iron supplement. -Start daily p.o. ferrous sulfate supplement

## 2023-04-26 NOTE — Assessment & Plan Note (Signed)
Chronic bilateral knee pain, previously thought to be most likely osteoarthritis.  Has been previously treated with intra-articular steroid injections and reports improvement.  Patient is requesting another injection.  Currently right knee is much more painful than left knee.  Additionally patient is agreeable to starting physical therapy. -Return in 1 week for steroid knee injection -Referral to physical therapy

## 2023-04-30 ENCOUNTER — Other Ambulatory Visit: Payer: Self-pay | Admitting: Hematology and Oncology

## 2023-05-03 ENCOUNTER — Encounter: Payer: Self-pay | Admitting: Adult Health

## 2023-05-03 ENCOUNTER — Other Ambulatory Visit: Payer: Self-pay

## 2023-05-03 ENCOUNTER — Ambulatory Visit: Payer: Medicare HMO | Admitting: Family Medicine

## 2023-05-03 ENCOUNTER — Encounter: Payer: Self-pay | Admitting: Family Medicine

## 2023-05-03 VITALS — BP 165/81 | HR 90 | Ht 69.0 in | Wt 268.0 lb

## 2023-05-03 DIAGNOSIS — M25562 Pain in left knee: Secondary | ICD-10-CM

## 2023-05-03 DIAGNOSIS — M179 Osteoarthritis of knee, unspecified: Secondary | ICD-10-CM

## 2023-05-03 DIAGNOSIS — Z23 Encounter for immunization: Secondary | ICD-10-CM | POA: Insufficient documentation

## 2023-05-03 DIAGNOSIS — G8929 Other chronic pain: Secondary | ICD-10-CM

## 2023-05-03 DIAGNOSIS — M25561 Pain in right knee: Secondary | ICD-10-CM

## 2023-05-03 MED ORDER — ZOSTER VAC RECOMB ADJUVANTED 50 MCG/0.5ML IM SUSR
0.5000 mL | Freq: Once | INTRAMUSCULAR | 1 refills | Status: AC
Start: 2023-05-03 — End: 2023-05-03

## 2023-05-03 MED ORDER — METHYLPREDNISOLONE ACETATE 40 MG/ML IJ SUSP
40.0000 mg | Freq: Once | INTRAMUSCULAR | Status: AC
Start: 2023-05-03 — End: 2023-05-03
  Administered 2023-05-03: 40 mg via INTRAMUSCULAR

## 2023-05-03 NOTE — Patient Instructions (Signed)
Good to see you today - Thank you for coming in  Things we discussed today:  1) I injected your Right knee today with steroid.   2) I have sent an order for you to get your Shingles vaccine at your pharmacy. You will get 1 now, and then a second dose 2-6 months later.  Please always bring your medication bottles  Come back to see me in 1 month.

## 2023-05-03 NOTE — Assessment & Plan Note (Signed)
Provided R knee steroid injection today. Consent obtained.   Procedure note: - Cleansed w/ alcohol wipe - Using 25gauge 1.5in needle, entered R knee joint space from anteromedial approach - injected 40mg  Depo-Medrol and 4cc lidocaine - Achieved hemostasis and bandage applied

## 2023-05-03 NOTE — Progress Notes (Signed)
    SUBJECTIVE:   CHIEF COMPLAINT / HPI:   Kimberly [name] is a 52yo F w/ hx of recurrent L breast cancer s/p L mastectomy and now on chemo, obesity, chronic knee pain, IDA that p/f R knee steroid injection.  Obesity Pt expressed desire for nutritional guidance to help with weight loss. Pt was previously exercising at [facility], but stopped once she started dealing w/ Breast cancer again. She wants to give more attention to her other healthcare needs since her breast cancer care was taking a lot of her time.   HM - due for shingles - due for pap  OBJECTIVE:   BP (!) 165/81   Pulse 90   Ht 5\' 9"  (1.753 m)   Wt 268 lb (121.6 kg)   LMP 07/13/2021 (Approximate)   SpO2 100%   BMI 39.58 kg/m   General: Alert, pleasant woman. NAD. HEENT: NCAT. MMM. CV: RRR, no murmurs. Cap refill <2. Resp: CTAB, no wheezing or crackles. Normal WOB on RA.  Abm: Soft, nontender, nondistended. BS present. Ext: R knee tender to palpation anteriorly and with flexion. No overlying skin changes or obvious effusion of R knee.   ASSESSMENT/PLAN:   Chronic pain of both knees Provided R knee steroid injection today. Consent obtained.   Procedure note: - Cleansed w/ alcohol wipe - Using 25gauge 1.5in needle, entered R knee joint space from anteromedial approach - injected 40mg  Depo-Medrol and 4cc lidocaine - Achieved hemostasis and bandage applied  Morbid obesity (HCC) Pt interested in Nutrional referral to help with weight loss and exercise. - Referral to NMT to see Dr. Gerilyn Pilgrim. Provided number to schedule appointment.  Need for shingles vaccine - ordered Shingrix. 1 dose now, and second in 2-6 months.   Follow-up Recs: - f/u in 1 month for pap smear  [name], MD [facility]

## 2023-05-03 NOTE — Assessment & Plan Note (Signed)
-   ordered Shingrix. 1 dose now, and second in 2-6 months.

## 2023-05-03 NOTE — Assessment & Plan Note (Signed)
Pt interested in Nutrional referral to help with weight loss and exercise. - Referral to NMT to see Dr. Gerilyn Pilgrim. Provided number to schedule appointment.

## 2023-05-17 ENCOUNTER — Inpatient Hospital Stay: Payer: Medicare HMO

## 2023-05-17 ENCOUNTER — Telehealth: Payer: Self-pay | Admitting: Hematology and Oncology

## 2023-05-17 ENCOUNTER — Inpatient Hospital Stay: Payer: Medicare HMO | Attending: Oncology

## 2023-05-17 ENCOUNTER — Inpatient Hospital Stay (HOSPITAL_BASED_OUTPATIENT_CLINIC_OR_DEPARTMENT_OTHER): Payer: Medicare HMO | Admitting: Hematology and Oncology

## 2023-05-17 VITALS — BP 136/88 | HR 99 | Temp 97.7°F | Resp 18 | Ht 69.0 in | Wt 265.8 lb

## 2023-05-17 DIAGNOSIS — D509 Iron deficiency anemia, unspecified: Secondary | ICD-10-CM | POA: Diagnosis not present

## 2023-05-17 DIAGNOSIS — C50912 Malignant neoplasm of unspecified site of left female breast: Secondary | ICD-10-CM

## 2023-05-17 DIAGNOSIS — Z5111 Encounter for antineoplastic chemotherapy: Secondary | ICD-10-CM | POA: Insufficient documentation

## 2023-05-17 DIAGNOSIS — N951 Menopausal and female climacteric states: Secondary | ICD-10-CM

## 2023-05-17 DIAGNOSIS — C50412 Malignant neoplasm of upper-outer quadrant of left female breast: Secondary | ICD-10-CM | POA: Diagnosis not present

## 2023-05-17 DIAGNOSIS — Z170 Estrogen receptor positive status [ER+]: Secondary | ICD-10-CM | POA: Insufficient documentation

## 2023-05-17 DIAGNOSIS — D508 Other iron deficiency anemias: Secondary | ICD-10-CM

## 2023-05-17 LAB — CBC WITH DIFFERENTIAL (CANCER CENTER ONLY)
Abs Immature Granulocytes: 0.04 10*3/uL (ref 0.00–0.07)
Basophils Absolute: 0 10*3/uL (ref 0.0–0.1)
Basophils Relative: 1 %
Eosinophils Absolute: 0 10*3/uL (ref 0.0–0.5)
Eosinophils Relative: 0 %
HCT: 34.8 % — ABNORMAL LOW (ref 36.0–46.0)
Hemoglobin: 10.7 g/dL — ABNORMAL LOW (ref 12.0–15.0)
Immature Granulocytes: 1 %
Lymphocytes Relative: 36 %
Lymphs Abs: 2.5 10*3/uL (ref 0.7–4.0)
MCH: 24.4 pg — ABNORMAL LOW (ref 26.0–34.0)
MCHC: 30.7 g/dL (ref 30.0–36.0)
MCV: 79.3 fL — ABNORMAL LOW (ref 80.0–100.0)
Monocytes Absolute: 0.5 10*3/uL (ref 0.1–1.0)
Monocytes Relative: 8 %
Neutro Abs: 3.9 10*3/uL (ref 1.7–7.7)
Neutrophils Relative %: 54 %
Platelet Count: 253 10*3/uL (ref 150–400)
RBC: 4.39 MIL/uL (ref 3.87–5.11)
RDW: 29.4 % — ABNORMAL HIGH (ref 11.5–15.5)
WBC Count: 7 10*3/uL (ref 4.0–10.5)
nRBC: 0 % (ref 0.0–0.2)

## 2023-05-17 MED ORDER — FULVESTRANT 250 MG/5ML IM SOSY
500.0000 mg | PREFILLED_SYRINGE | Freq: Once | INTRAMUSCULAR | Status: AC
  Administered 2023-05-17: 500 mg via INTRAMUSCULAR
  Filled 2023-05-17: qty 10

## 2023-05-17 NOTE — Telephone Encounter (Signed)
Patient is aware of upcoming appointment times/dates.  
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
## 2023-05-21 ENCOUNTER — Other Ambulatory Visit: Payer: Self-pay

## 2023-05-29 ENCOUNTER — Ambulatory Visit: Payer: Medicare HMO

## 2023-06-02 NOTE — Progress Notes (Deleted)
    SUBJECTIVE:   CHIEF COMPLAINT / HPI:   ***  Pap smear  Shingrix, tdap  PERTINENT  PMH / PSH: ***  OBJECTIVE:   LMP 07/13/2021 (Approximate)   ***  ASSESSMENT/PLAN:   No problem-specific Assessment & Plan notes found for this encounter.     [name], MD [facility]

## 2023-06-03 ENCOUNTER — Ambulatory Visit: Payer: Medicare HMO | Admitting: Family Medicine

## 2023-06-13 ENCOUNTER — Other Ambulatory Visit: Payer: Self-pay

## 2023-06-13 DIAGNOSIS — D509 Iron deficiency anemia, unspecified: Secondary | ICD-10-CM

## 2023-06-14 ENCOUNTER — Other Ambulatory Visit: Payer: Self-pay | Admitting: *Deleted

## 2023-06-14 ENCOUNTER — Inpatient Hospital Stay: Payer: Medicare HMO

## 2023-06-14 ENCOUNTER — Inpatient Hospital Stay: Payer: Medicare HMO | Attending: Oncology

## 2023-06-14 VITALS — BP 144/89 | HR 92 | Temp 98.5°F | Resp 18

## 2023-06-14 DIAGNOSIS — C50412 Malignant neoplasm of upper-outer quadrant of left female breast: Secondary | ICD-10-CM | POA: Diagnosis not present

## 2023-06-14 DIAGNOSIS — Z5111 Encounter for antineoplastic chemotherapy: Secondary | ICD-10-CM | POA: Diagnosis not present

## 2023-06-14 DIAGNOSIS — D508 Other iron deficiency anemias: Secondary | ICD-10-CM

## 2023-06-14 DIAGNOSIS — D509 Iron deficiency anemia, unspecified: Secondary | ICD-10-CM

## 2023-06-14 LAB — CBC WITH DIFFERENTIAL (CANCER CENTER ONLY)
Abs Immature Granulocytes: 0.02 10*3/uL (ref 0.00–0.07)
Basophils Absolute: 0 10*3/uL (ref 0.0–0.1)
Basophils Relative: 0 %
Eosinophils Absolute: 0 10*3/uL (ref 0.0–0.5)
Eosinophils Relative: 0 %
HCT: 34.5 % — ABNORMAL LOW (ref 36.0–46.0)
Hemoglobin: 11.5 g/dL — ABNORMAL LOW (ref 12.0–15.0)
Immature Granulocytes: 0 %
Lymphocytes Relative: 40 %
Lymphs Abs: 2.9 10*3/uL (ref 0.7–4.0)
MCH: 27.1 pg (ref 26.0–34.0)
MCHC: 33.3 g/dL (ref 30.0–36.0)
MCV: 81.2 fL (ref 80.0–100.0)
Monocytes Absolute: 0.5 10*3/uL (ref 0.1–1.0)
Monocytes Relative: 7 %
Neutro Abs: 3.9 10*3/uL (ref 1.7–7.7)
Neutrophils Relative %: 53 %
Platelet Count: 264 10*3/uL (ref 150–400)
RBC: 4.25 MIL/uL (ref 3.87–5.11)
RDW: 27.4 % — ABNORMAL HIGH (ref 11.5–15.5)
WBC Count: 7.3 10*3/uL (ref 4.0–10.5)
nRBC: 0 % (ref 0.0–0.2)

## 2023-06-14 LAB — CMP (CANCER CENTER ONLY)
ALT: 6 U/L (ref 0–44)
AST: 10 U/L — ABNORMAL LOW (ref 15–41)
Albumin: 4.2 g/dL (ref 3.5–5.0)
Alkaline Phosphatase: 70 U/L (ref 38–126)
Anion gap: 7 (ref 5–15)
BUN: 5 mg/dL — ABNORMAL LOW (ref 6–20)
CO2: 25 mmol/L (ref 22–32)
Calcium: 9.6 mg/dL (ref 8.9–10.3)
Chloride: 107 mmol/L (ref 98–111)
Creatinine: 0.95 mg/dL (ref 0.44–1.00)
GFR, Estimated: >60 mL/min (ref >60)
Glucose, Bld: 98 mg/dL (ref 70–99)
Potassium: 3.4 mmol/L — ABNORMAL LOW (ref 3.5–5.1)
Sodium: 139 mmol/L (ref 135–145)
Total Bilirubin: 0.3 mg/dL (ref 0.3–1.2)
Total Protein: 7.5 g/dL (ref 6.5–8.1)

## 2023-06-14 LAB — FERRITIN: Ferritin: 12 ng/mL (ref 11–307)

## 2023-06-14 MED ORDER — FULVESTRANT 250 MG/5ML IM SOSY
500.0000 mg | PREFILLED_SYRINGE | Freq: Once | INTRAMUSCULAR | Status: AC
  Administered 2023-06-14: 500 mg via INTRAMUSCULAR
  Filled 2023-06-14: qty 10

## 2023-06-17 ENCOUNTER — Other Ambulatory Visit: Payer: Self-pay | Admitting: Adult Health

## 2023-06-21 ENCOUNTER — Ambulatory Visit (INDEPENDENT_AMBULATORY_CARE_PROVIDER_SITE_OTHER): Payer: Medicare HMO

## 2023-06-21 VITALS — Ht 69.0 in | Wt 265.0 lb

## 2023-06-21 DIAGNOSIS — Z0000 Encounter for general adult medical examination without abnormal findings: Secondary | ICD-10-CM | POA: Diagnosis not present

## 2023-06-21 NOTE — Progress Notes (Signed)
Subjective:   Kimberly [name] is a 52 y.o. female who presents for Medicare Annual (Subsequent) preventive examination.  Visit Complete: Virtual  I connected with  Barron Alvine on 06/21/23 by a audio enabled telemedicine application and verified that I am speaking with the correct person using two identifiers.  Patient Location: Home  Provider Location: Home Office  I discussed the limitations of evaluation and management by telemedicine. The patient expressed understanding and agreed to proceed.  Vital Signs: Because this visit was a virtual/telehealth visit, some criteria may be missing or patient reported. Any vitals not documented were not able to be obtained and vitals that have been documented are patient reported.   Review of Systems     Cardiac Risk Factors include: smoking/ tobacco exposure;dyslipidemia;hypertension     Objective:    Today's Vitals   06/21/23 2214  Weight: 265 lb (120.2 kg)  Height: 5\' 9"  (1.753 m)   Body mass index is 39.13 kg/m.     06/21/2023   10:22 PM 04/04/2023    9:14 AM 11/28/2022   12:27 PM 08/31/2022    2:00 PM 06/27/2022    5:53 PM 03/07/2022    1:57 PM 01/31/2022   10:42 AM  Advanced Directives  Does Patient Have a Medical Advance Directive? No No No No No No No  Would patient like information on creating a medical advance directive? Yes (MAU/Ambulatory/Procedural Areas - Information given) No - Patient declined No - Patient declined No - Patient declined No - Patient declined No - Patient declined     Current Medications (verified) Outpatient Encounter Medications as of 06/21/2023  Medication Sig   albuterol (VENTOLIN HFA) 108 (90 Base) MCG/ACT inhaler Inhale 2 puffs into the lungs every 6 (six) hours as needed for wheezing or shortness of breath.   AMBULATORY NON FORMULARY MEDICATION Medication Name: Diltiazem 2%/ Lidocaine 5% Using your index finger, apply a small amount of medication inside the rectum up to your first  knuckle/joint 4 daily x 8 weeks.   azithromycin (ZITHROMAX) 250 MG tablet Take 1 tablet (250 mg total) by mouth daily. Take first 2 tablets together, then 1 every day until finished.   busPIRone (BUSPAR) 15 MG tablet Take 15 mg by mouth 3 (three) times daily.   doxepin (SINEQUAN) 75 MG capsule Take 75 mg by mouth at bedtime.   ferrous sulfate 324 MG TBEC Take 1 tablet (324 mg total) by mouth daily.   Fulvestrant (FASLODEX IM) Inject 2 Doses into the muscle every 14 (fourteen) days. Every other Tuesday   hydrocortisone (ANUSOL-HC) 25 MG suppository Use every other night for 2-weeks until prescription is complete   lidocaine-prilocaine (EMLA) cream Apply 1 application topically as needed. (Patient taking differently: Apply 1 application  topically daily as needed (port access).)   montelukast (SINGULAIR) 10 MG tablet Take 1 tablet (10 mg total) by mouth at bedtime. (Patient taking differently: Take 10 mg by mouth daily as needed (allergies).)   ondansetron (ZOFRAN) 8 MG tablet Take 1 tablet (8 mg total) by mouth every 8 (eight) hours as needed for nausea or vomiting.   oxybutynin (DITROPAN) 5 MG tablet TAKE 1/2 TABLET(2.5 MG) BY MOUTH AT BEDTIME   pantoprazole (PROTONIX) 40 MG tablet Take 1 tablet (40 mg total) by mouth daily.   PE-Doxylamine-DM-GG-APAP (VICKS DAYQUIL/NYQUIL SEVERE PO) Take 30 mLs by mouth at bedtime as needed (cold symptoms).   prazosin (MINIPRESS) 1 MG capsule Take 1 mg by mouth at bedtime.   pregabalin (LYRICA) 25 MG capsule  Take 1 capsule (25 mg total) by mouth at bedtime.   prochlorperazine (COMPAZINE) 5 MG tablet TAKE 1 TABLET(5 MG) BY MOUTH EVERY 6 HOURS AS NEEDED FOR VOMITING OR NAUSEA   Pseudoeph-CPM-DM-APAP (TYLENOL COLD & FLU DAY/NIGHT PO) Take 2 capsules by mouth at bedtime as needed (cold symptoms).   QUEtiapine (SEROQUEL) 400 MG tablet Take 400 mg by mouth at bedtime.    rosuvastatin (CRESTOR) 10 MG tablet Take 1 tablet (10 mg total) by mouth daily.   [DISCONTINUED]  doxycycline (VIBRAMYCIN) 100 MG capsule Take 1 capsule (100 mg total) by mouth 2 (two) times daily.   Tiotropium Bromide Monohydrate (SPIRIVA RESPIMAT) 1.25 MCG/ACT AERS Inhale 2 puffs into the lungs daily.   [DISCONTINUED] amoxicillin-clavulanate (AUGMENTIN) 875-125 MG tablet Take 1 tablet by mouth every 12 (twelve) hours.   No facility-administered encounter medications on file as of 06/21/2023.    Allergies (verified) Patient has no known allergies.   History: Past Medical History:  Diagnosis Date   Acute cholecystitis 04/17/2019   Anemia    Anxiety    Arthritis    Bipolar 1 disorder (HCC)    Blood transfusion without reported diagnosis 2007   post bleeding childbirth   Cancer (HCC)    left breast cancer 2015   COPD (chronic obstructive pulmonary disease) (HCC)    pt reports she is not on oxygen   COVID-19 virus infection 06/07/2019   Depression    Diabetes mellitus without complication (HCC)    gestational   Hand injury, right, initial encounter 03/19/2019   Heart murmur    as a child-not adult-no cardiac work up   History of radiation therapy 08/31/14-10/21/14   left breast/ left supraclavicular 50.4 Gy 28 fx, lef tposterior axillary boost 9.52 Gy 28 fx, left rbeast boost/ 10 Gy 5 fx   Personal history of chemotherapy    Personal history of radiation therapy    Rash and nonspecific skin eruption 12/27/2020   Past Surgical History:  Procedure Laterality Date   BREAST BIOPSY Left 04/01/14   BREAST BIOPSY Right 04/19/14   BREAST LUMPECTOMY WITH AXILLARY LYMPH NODE DISSECTION Bilateral 05/03/14   CHOLECYSTECTOMY N/A 04/18/2019   Procedure: LAPAROSCOPIC CHOLECYSTECTOMY WITH INTRAOPERATIVE CHOLANGIOGRAM;  Surgeon: Griselda Miner, MD;  Location: [facility];  Service: General;  Laterality: N/A;   DILATION AND CURETTAGE OF UTERUS     after childbirth   MASS EXCISION Right 07/25/2021   Procedure: RIGHT AXILLARY MASS EXCISION;  Surgeon: Emelia Loron, MD;  [facility];   Service: General;  Laterality: Right;   MINOR BREAST BIOPSY Left 07/25/2021   Procedure: SKIN PUNCH BIOPSY LEFT BREAST;  Surgeon: Emelia Loron, MD;  Location: [facility] OR;  Service: General;  Laterality: Left;   MULTIPLE TOOTH EXTRACTIONS     only 5 left   PORTACATH PLACEMENT Right 06/18/2014   Procedure: INSERTION PORT-A-CATH;  Surgeon: Emelia Loron, MD;  Location: [facility];  Service: General;  Laterality: Right;   TONSILLECTOMY AND ADENOIDECTOMY     TOTAL MASTECTOMY Left 08/28/2021   Procedure: LEFT TOTAL MASTECTOMY;  Surgeon: Emelia Loron, MD;  Location: [facility] OR;  Service: General;  Laterality: Left;  90 MINUTES- POST PER KELLY AND SHE WILL PUT IN ROOM   Family History  Problem Relation Age of Onset   Heart disease Father    Cancer Father        Prostate   Cancer Maternal Grandmother    Depression Maternal Grandfather    Cancer Paternal Grandmother    Stomach  cancer Neg Hx    Esophageal cancer Neg Hx    Rectal cancer Neg Hx    Social History   Socioeconomic History   Marital status: Widowed    Spouse name: Not on file   Number of children: 5   Years of education: 41   Highest education level: High school graduate  Occupational History   Occupation: disabled  Tobacco Use   Smoking status: Every Day    Current packs/day: 0.50    Average packs/day: 0.5 packs/day for 34.0 years (17.0 ttl pk-yrs)    Types: Cigarettes   Smokeless tobacco: Never  Vaping Use   Vaping status: Never Used  Substance and Sexual Activity   Alcohol use: No   Drug use: No   Sexual activity: Not Currently  Other Topics Concern   Not on file  Social History Narrative   Patient lives in [place] with 3 of her children.    One adult son lives "up the road" and one lives in [place].   Patient is on disability.    Patient enjoys spending time with her family and watching TV.    Patient walks around her home for exercise.    Social Determinants of Health   Financial  Resource Strain: Low Risk  (06/21/2023)   Overall Financial Resource Strain (CARDIA)    Difficulty of Paying Living Expenses: Not very hard  Food Insecurity: Food Insecurity Present (06/21/2023)   Hunger Vital Sign    Worried About Running Out of Food in the Last Year: Sometimes true    Ran Out of Food in the Last Year: Never true  Transportation Needs: No Transportation Needs (06/21/2023)   PRAPARE - Administrator, Civil Service (Medical): No    Lack of Transportation (Non-Medical): No  Physical Activity: Inactive (06/21/2023)   Exercise Vital Sign    Days of Exercise per Week: 0 days    Minutes of Exercise per Session: 0 min  Stress: No Stress Concern Present (06/21/2023)   [employer] of Occupational Health - Occupational Stress Questionnaire    Feeling of Stress : Not at all  Social Connections: Moderately Isolated (06/21/2023)   Social Connection and Isolation Panel [NHANES]    Frequency of Communication with Friends and Family: More than three times a week    Frequency of Social Gatherings with Friends and Family: Three times a week    Attends Religious Services: More than 4 times per year    Active Member of Clubs or Organizations: No    Attends Banker Meetings: Never    Marital Status: Widowed    Tobacco Counseling Ready to quit: Not Answered Counseling given: Not Answered   Clinical Intake:  Pre-visit preparation completed: Yes  Pain : No/denies pain     Diabetes: No  How often do you need to have someone help you when you read instructions, pamphlets, or other written materials from your doctor or pharmacy?: 1 - Never  Interpreter Needed?: No  Information entered by :: Kandis Fantasia LPN   Activities of Daily Living    06/21/2023   10:20 PM 06/27/2022    5:59 PM  In your present state of health, do you have any difficulty performing the following activities:  Hearing? 0   Vision? 0   Difficulty concentrating or making  decisions? 0   Walking or climbing stairs? 0   Dressing or bathing? 0   Doing errands, shopping? 0 0  Preparing Food and eating ? N  Using the Toilet? N   In the past six months, have you accidently leaked urine? N   Do you have problems with loss of bowel control? N   Managing your Medications? N   Managing your Finances? N   Housekeeping or managing your Housekeeping? N     Patient Care Team: [name], MD as PCP - General (Family Medicine) Lonie Peak, MD as Attending Physician (Radiation Oncology) Emelia Loron, MD as Consulting Physician (General Surgery) Causey, Larna Daughters, NP as Nurse Practitioner (Hematology and Oncology) Anselm Lis, RPH-CPP (Pharmacist) Rachel Moulds, MD as Consulting Physician (Hematology and Oncology)  Indicate any recent Medical Services you may have received from other than [facility] providers in the past year (date may be approximate).     Assessment:   This is a routine wellness examination for Reeva.  Hearing/Vision screen Hearing Screening - Comments:: Denies hearing difficulties   Vision Screening - Comments:: No vision problems; will schedule routine eye exam soon    Dietary issues and exercise activities discussed:     Goals Addressed   None   Depression Screen    06/21/2023   10:18 PM 05/03/2023   11:19 AM 04/24/2023   11:26 AM 11/28/2022   11:29 AM 09/06/2022   10:45 AM 03/08/2022    4:21 PM 10/10/2021   11:53 AM  PHQ 2/9 Scores  PHQ - 2 Score 3 3 4 2 1  2   PHQ- 9 Score 13 13 17 12   13   Exception Documentation      Patient refusal     Fall Risk    06/21/2023   10:20 PM 05/03/2023   11:19 AM 09/06/2022   10:13 AM 08/31/2022    1:59 PM 03/08/2022    3:55 PM  Fall Risk   Falls in the past year? 1 1 0 0 0  Number falls in past yr: 0 0  0 0  Injury with Fall? 0 0  0 0  Risk for fall due to : History of fall(s);Impaired mobility      Follow up Education provided;Falls prevention discussed;Falls  evaluation completed        MEDICARE RISK AT HOME: Medicare Risk at Home Any stairs in or around the home?: No If so, are there any without handrails?: No Home free of loose throw rugs in walkways, pet beds, electrical cords, etc?: Yes Adequate lighting in your home to reduce risk of falls?: Yes Life alert?: No Use of a cane, walker or w/c?: No Grab bars in the bathroom?: Yes Shower chair or bench in shower?: No Elevated toilet seat or a handicapped toilet?: No  TIMED UP AND GO:  Was the test performed?  No    Cognitive Function:        06/21/2023   10:22 PM 10/10/2021   12:01 PM  6CIT Screen  What Year? 0 points 0 points  What month? 0 points 0 points  What time? 0 points 0 points  Count back from 20 0 points 0 points  Months in reverse 0 points 0 points  Repeat phrase 0 points 0 points  Total Score 0 points 0 points    Immunizations Immunization History  Administered Date(s) Administered   Influenza Split 09/26/2012   Influenza,inj,Quad PF,6+ Mos 09/29/2015, 09/23/2017, 10/06/2018, 06/28/2021   PNEUMOCOCCAL CONJUGATE-20 07/05/2021   Pneumococcal Polysaccharide-23 03/08/2017   Td 09/29/2008    TDAP status: Due, Education has been provided regarding the importance of this vaccine. Advised may receive this vaccine at local  pharmacy or Health Dept. Aware to provide a copy of the vaccination record if obtained from local pharmacy or Health Dept. Verbalized acceptance and understanding.  Flu Vaccine status: Due, Education has been provided regarding the importance of this vaccine. Advised may receive this vaccine at local pharmacy or Health Dept. Aware to provide a copy of the vaccination record if obtained from local pharmacy or Health Dept. Verbalized acceptance and understanding.  Pneumococcal vaccine status: Up to date  Covid-19 vaccine status: Information provided on how to obtain vaccines.   Qualifies for Shingles Vaccine? Yes   Zostavax completed No    Shingrix Completed?: No.    Education has been provided regarding the importance of this vaccine. Patient has been advised to call insurance company to determine out of pocket expense if they have not yet received this vaccine. Advised may also receive vaccine at local pharmacy or Health Dept. Verbalized acceptance and understanding.  Screening Tests Health Maintenance  Topic Date Due   COVID-19 Vaccine (1) Never done   Hepatitis C Screening  Never done   Zoster Vaccines- Shingrix (1 of 2) Never done   DTaP/Tdap/Td (2 - Tdap) 09/29/2018   PAP SMEAR-Modifier  02/26/2019   MAMMOGRAM  05/25/2023   INFLUENZA VACCINE  05/23/2023   Medicare Annual Wellness (AWV)  06/20/2024   Colonoscopy  08/11/2024   HIV Screening  Completed   HPV VACCINES  Aged Out    Health Maintenance  Health Maintenance Due  Topic Date Due   COVID-19 Vaccine (1) Never done   Hepatitis C Screening  Never done   Zoster Vaccines- Shingrix (1 of 2) Never done   DTaP/Tdap/Td (2 - Tdap) 09/29/2018   PAP SMEAR-Modifier  02/26/2019   MAMMOGRAM  05/25/2023   INFLUENZA VACCINE  05/23/2023    Colorectal cancer screening: Type of screening: Colonoscopy. Completed 08/11/21. Repeat every 3 years  Mammogram status:  Patient states that oncology is handling  Lung Cancer Screening: (Low Dose CT Chest recommended if Age 46-80 years, 20 pack-year currently smoking OR have quit w/in 15years.) does not qualify.   Lung Cancer Screening Referral: n/a  Additional Screening:  Hepatitis C Screening: does qualify  Vision Screening: Recommended annual ophthalmology exams for early detection of glaucoma and other disorders of the eye. Is the patient up to date with their annual eye exam?  No  Who is the provider or what is the name of the office in which the patient attends annual eye exams? none If pt is not established with a provider, would they like to be referred to a provider to establish care? No .   Dental Screening:  Recommended annual dental exams for proper oral hygiene  Community Resource Referral / Chronic Care Management: CRR required this visit?  No   CCM required this visit?  No     Plan:     I have personally reviewed and noted the following in the patient's chart:   Medical and social history Use of alcohol, tobacco or illicit drugs  Current medications and supplements including opioid prescriptions. Patient is not currently taking opioid prescriptions. Functional ability and status Nutritional status Physical activity Advanced directives List of other physicians Hospitalizations, surgeries, and ER visits in previous 12 months Vitals Screenings to include cognitive, depression, and falls Referrals and appointments  In addition, I have reviewed and discussed with patient certain preventive protocols, quality metrics, and best practice recommendations. A written personalized care plan for preventive services as well as general preventive health recommendations were provided  to patient.     Kandis Fantasia [place], [place]   04/20/5283   After Visit Summary: (MyChart) Due to this being a telephonic visit, the after visit summary with patients personalized plan was offered to patient via MyChart   Nurse Notes: No concerns at this time

## 2023-06-21 NOTE — Patient Instructions (Signed)
Ms. Mayall , Thank you for taking time to come for your Medicare Wellness Visit. I appreciate your ongoing commitment to your health goals. Please review the following plan we discussed and let me know if I can assist you in the future.   Referrals/Orders/Follow-Ups/Clinician Recommendations: Aim for 30 minutes of exercise or brisk walking, 6-8 glasses of water, and 5 servings of fruits and vegetables each day.  This is a list of the screening recommended for you and due dates:  Health Maintenance  Topic Date Due   COVID-19 Vaccine (1) Never done   Hepatitis C Screening  Never done   Zoster (Shingles) Vaccine (1 of 2) Never done   DTaP/Tdap/Td vaccine (2 - Tdap) 09/29/2018   Pap Smear  02/26/2019   Mammogram  05/25/2023   Flu Shot  05/23/2023   Medicare Annual Wellness Visit  06/20/2024   Colon Cancer Screening  08/11/2024   HIV Screening  Completed   HPV Vaccine  Aged Out    Advanced directives: (ACP Link)Information on Advanced Care Planning can be found at [facility] Advance Health Care Directives Advance Health Care Directives ([email or web address]   Next Medicare Annual Wellness Visit scheduled for next year: Yes

## 2023-06-28 ENCOUNTER — Ambulatory Visit: Payer: Medicare HMO | Admitting: Physician Assistant

## 2023-06-28 VITALS — BP 118/70 | HR 98 | Ht 69.0 in

## 2023-06-28 DIAGNOSIS — K642 Third degree hemorrhoids: Secondary | ICD-10-CM | POA: Diagnosis not present

## 2023-06-28 DIAGNOSIS — K625 Hemorrhage of anus and rectum: Secondary | ICD-10-CM

## 2023-06-28 DIAGNOSIS — D509 Iron deficiency anemia, unspecified: Secondary | ICD-10-CM

## 2023-06-28 DIAGNOSIS — R197 Diarrhea, unspecified: Secondary | ICD-10-CM | POA: Diagnosis not present

## 2023-06-28 MED ORDER — DICYCLOMINE HCL 10 MG PO CAPS: 10.0000 mg | ORAL_CAPSULE | Freq: Three times a day (TID) | ORAL | 6 refills | Status: DC

## 2023-06-28 NOTE — Patient Instructions (Addendum)
_______________________________________________________  If your blood pressure at your visit was 140/90 or greater, please contact your primary care physician to follow up on this.  If you are age 51 or younger, your body mass index should be between 19-25. Your Body mass index is 39.13 kg/m. If this is out of the aformentioned range listed, please consider follow up with your Primary Care Provider.  ________________________________________________________  The Advance GI providers would like to encourage you to use [facility] to communicate with providers for non-urgent requests or questions.  Due to long hold times on the telephone, sending your provider a message by [facility] may be a faster and more efficient way to get a response.  Please allow 48 business hours for a response.  Please remember that this is for non-urgent requests.  _______________________________________________________  We have sent the following medications to your pharmacy for you to pick up at your convenience:  START: Bentyl 10mg  one tablet three time daily for diarrhea and cramping.  STAY off of Lactose.  Take Lactaid 2 tablets with meals if consuming dairy products.  You are scheduled for a hemorrhoid banding appointment with Dr Myrtie Neither on 09-17-23 at 820am.  Thank you for entrusting me with your care and choosing [facility].  Amy Esterwood, [name]

## 2023-06-30 ENCOUNTER — Encounter: Payer: Self-pay | Admitting: Physician Assistant
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
## 2023-07-04 NOTE — Progress Notes (Signed)
____________________________________________________________  Attending physician addendum:  Thank you for sending this case to me. I have reviewed the entire note and agree with the plan.  In the interim, she should have a small bowel video capsule study for complete workup of her IDA.  Amada Jupiter, MD  ____________________________________________________________

## 2023-07-09 ENCOUNTER — Other Ambulatory Visit: Payer: Self-pay | Admitting: *Deleted

## 2023-07-09 DIAGNOSIS — D508 Other iron deficiency anemias: Secondary | ICD-10-CM

## 2023-07-10 ENCOUNTER — Inpatient Hospital Stay: Payer: Medicare HMO | Attending: Oncology | Admitting: Hematology and Oncology

## 2023-07-10 ENCOUNTER — Inpatient Hospital Stay: Payer: Medicare HMO

## 2023-07-11 ENCOUNTER — Ambulatory Visit (INDEPENDENT_AMBULATORY_CARE_PROVIDER_SITE_OTHER): Payer: Medicare HMO | Admitting: Family Medicine

## 2023-07-11 ENCOUNTER — Encounter: Payer: Self-pay | Admitting: Family Medicine

## 2023-07-11 VITALS — BP 134/81 | HR 99 | Ht 69.0 in | Wt 263.0 lb

## 2023-07-11 DIAGNOSIS — R350 Frequency of micturition: Secondary | ICD-10-CM | POA: Diagnosis not present

## 2023-07-11 DIAGNOSIS — N3000 Acute cystitis without hematuria: Secondary | ICD-10-CM

## 2023-07-11 LAB — POCT URINALYSIS DIP (CLINITEK)
Bilirubin, UA: NEGATIVE
Blood, UA: NEGATIVE
Glucose, UA: NEGATIVE mg/dL
Ketones, POC UA: NEGATIVE mg/dL
Nitrite, UA: POSITIVE — AB
POC PROTEIN,UA: NEGATIVE
Spec Grav, UA: >=1.03 — AB (ref 1.010–1.025)
Urobilinogen, UA: 0.2 E.U./dL
pH, UA: 5.5 (ref 5.0–8.0)

## 2023-07-11 LAB — POCT UA - MICROSCOPIC ONLY
Epithelial cells, urine per micros: >20
RBC, Urine, Miroscopic: NONE SEEN (ref 0–2)

## 2023-07-11 MED ORDER — CEPHALEXIN 500 MG PO CAPS: 500.0000 mg | ORAL_CAPSULE | Freq: Two times a day (BID) | ORAL | 0 refills | Status: AC

## 2023-07-11 NOTE — Progress Notes (Signed)
    SUBJECTIVE:   CHIEF COMPLAINT / HPI:   Urinary concerns Urine appeared cloudy, was foul smelling, and had urgency while at the beach 2 weeks ago for one week. No dysuria.  Had some lower back pain while walking.  No vaginal discharge or bleeding. No rashes. No diarrhea.  No fevers. Was in the swimming pool and hot tub there and feels this or eating fish could have done this. No sexual activity for 20 years. Symptoms have been present before when she had trichomonas. Once home in [place] 3 days ago, her symptoms resolved.   PERTINENT  PMH / PSH: COPD, bladder disorder, recurrent breast cancer currently on chemo, bipolar depression, anemia iron deficiency  OBJECTIVE:   BP 134/81   Pulse 99   Ht 5\' 9"  (1.753 m)   Wt 263 lb (119.3 kg)   LMP 07/13/2021 (Approximate)   SpO2 100%   BMI 38.84 kg/m   General: Alert and oriented, in NAD Skin: Warm, dry, and intact HEENT: NCAT, EOM grossly normal, midline nasal septum Cardiac: RRR, no m/r/g appreciated Respiratory/Back: CTAB, breathing and speaking comfortably on RA, very mild reported CVA tenderness Abdominal: Soft, nontender, nondistended, normoactive bowel sounds Extremities: Moves all extremities grossly equally Neurological: No gross focal deficit Psychiatric: Appropriate mood and affect   ASSESSMENT/PLAN:   UTI History, exam, UA suggestive of UTI.  Will send for culture. Less likely pyelonephritis given no fevers and very mild reported CVA tenderness. Given resolution of symptoms, discussed possibility of not treating.  Patient elects to take Keflex twice daily for 5 days.  Discussed following up should symptoms recur or she start to have fevers or increasing back pain.  Health maintenance Consider vaccinations at next visit with PCP.  Janeal Holmes, MD [facility]

## 2023-07-11 NOTE — Patient Instructions (Addendum)
It was great to see you today! Here's what we talked about:  Your urine studies show you have a UTI. Because you had symptoms, it is reasonable to treat you for this. Take the keflex twice a day for 5 days. Let me know if symptoms return or get worse.  Please let me know if you have any other questions.  Dr. Phineas Real

## 2023-07-15 ENCOUNTER — Other Ambulatory Visit: Payer: Self-pay

## 2023-07-15 ENCOUNTER — Telehealth: Payer: Self-pay

## 2023-07-15 DIAGNOSIS — D509 Iron deficiency anemia, unspecified: Secondary | ICD-10-CM

## 2023-07-15 LAB — URINE CULTURE

## 2023-07-15 NOTE — Telephone Encounter (Signed)
Call placed to the patient. No answer. Left her a message with the reason for my call. Also asked her to check her My Chart for the "patient message" I have sent to her with the explanation used by [facility] explaining the capsule endoscopy. Asked the patient to contact me to discuss.

## 2023-07-15 NOTE — Telephone Encounter (Signed)
-----   Message from Mike Gip sent at 07/12/2023  4:06 PM EDT ----- Regarding: Capsule Beth , can you please contact the patient and let her know that Dr. Myrtie Neither feels that she should have a capsule endoscopy at some point to complete workup for her iron deficiency anemia. If she is okay with being scheduled for that can we please proceed- thank you.  Did not discuss capsule endoscopy with her at the time of her office visit ----- Message ----- From: Sherrilyn Rist, MD Sent: 07/04/2023   3:52 PM EDT To: Sammuel Cooper, [name]     ----- Message ----- From: Sammuel Cooper, [name] Sent: 06/30/2023   5:22 PM EDT To: Sherrilyn Rist, MD

## 2023-07-17 ENCOUNTER — Telehealth: Payer: Self-pay | Admitting: Gastroenterology

## 2023-07-17 NOTE — Telephone Encounter (Signed)
Patient called back. Agrees to this plan of care. Instructions given and also available through My Chart. Insurance authorization requested.

## 2023-07-17 NOTE — Telephone Encounter (Addendum)
Case # [id number] Insurance ID # [id number]  Spoke with [employer] Peer to Peer Review representative. Offer for peer to peer accepted. The case will now be sent to the medical director for [facility]. If the peer to peer is still required, the medical director will randomly call to discuss.  [employer] refused to record the time and dates our provider would be able to discuss this case peer to peer.   I will contact the patient and reschedule the capsule endoscopy.

## 2023-07-17 NOTE — Telephone Encounter (Signed)
Spoke with the patient and explained the reason her procedure postponement is due to insurance. Patient expresses acknowledgement and her frustration with insurance.

## 2023-07-17 NOTE — Telephone Encounter (Signed)
After uploading the records on [employer], [employer] would like for a peer to peer to be done. Please advise a good date and time for this to be done.  Peer to peer: [phone]

## 2023-07-17 NOTE — Telephone Encounter (Signed)
Given my current clinical schedule, office hours, procedures, administrative meetings and PTO, my next available time for this would be from 1245 to 1:15 PM on Wednesday, October 2.  I believe this is the day of her video capsule study, so that will need to be rescheduled until after we have approval for the study.  If that date is unavailable for peer to peer, then I could be available during the same hours on October 15, 16th or 17th   Please let me know   H Danis

## 2023-07-17 NOTE — Telephone Encounter (Signed)
Patient called requesting to speak with you regarding message below.

## 2023-07-17 NOTE — Telephone Encounter (Signed)
Beth,  I need some more information about this please.  What diagnostic test requires the peer to peer review? Small bowel video capsule study?  HD

## 2023-07-17 NOTE — Telephone Encounter (Signed)
Yes, the capsule endoscopy.

## 2023-07-18 ENCOUNTER — Ambulatory Visit (INDEPENDENT_AMBULATORY_CARE_PROVIDER_SITE_OTHER): Payer: Medicare HMO | Admitting: Family Medicine

## 2023-07-18 VITALS — BP 127/80 | HR 101 | Temp 98.0°F | Ht 69.0 in | Wt 262.6 lb

## 2023-07-18 DIAGNOSIS — E039 Hypothyroidism, unspecified: Secondary | ICD-10-CM | POA: Diagnosis not present

## 2023-07-18 DIAGNOSIS — G620 Drug-induced polyneuropathy: Secondary | ICD-10-CM | POA: Diagnosis not present

## 2023-07-18 DIAGNOSIS — D649 Anemia, unspecified: Secondary | ICD-10-CM | POA: Diagnosis not present

## 2023-07-18 DIAGNOSIS — F4310 Post-traumatic stress disorder, unspecified: Secondary | ICD-10-CM | POA: Diagnosis not present

## 2023-07-18 MED ORDER — OXYCODONE HCL 5 MG PO TABS
5.0000 mg | ORAL_TABLET | Freq: Two times a day (BID) | ORAL | 0 refills | Status: AC | PRN
Start: 2023-07-18 — End: 2023-07-23

## 2023-07-18 NOTE — Progress Notes (Signed)
    SUBJECTIVE:   CHIEF COMPLAINT / HPI: bilateral hand pain  51 year old female with past medical history of recurrent L breast cancer s/p L mastectomy on chemo, obesity, chronic knee pain, IDA who presents today with bilateral hand pain.   Bilateral Hand Pain  Neuropathic Pain Patient reports having neuropathic pain (sharp, shooting, tingly, and burning) in her hands from getting chemo for the past six months. It was really bad last night, she reports being unable to close her hands/clench her fists. She has tried wrist splints for carpal tunnel, but they don't seem to help. Even driving for 5 minutes makes her fingers and wrist numb.   She doesn't think its related to her chemotherapy which recently was changed from Donnamarie Poag, and Faslodex to just Faslodex. She has tried Tylenol, Ibuprofen, Gabapentin, Pregabalin, and Diazepam previously which didn't seem to resolve her symptoms. She reports that the Gabapentin and Pregabalin make it worse. She has even tried heating and icing her hands to treat her symptoms due to the severe neuropathy.   OBJECTIVE:   BP 127/80   Pulse (!) 101   Temp 98 F (36.7 C)   Ht 5\' 9"  (1.753 m)   Wt 262 lb 9.6 oz (119.1 kg)   LMP 07/13/2021 (Approximate)   SpO2 99%   BMI 38.78 kg/m   General: Awake and Alert in NAD HEENT: Normocephalic, atraumatic. Conjunctiva normal. No nasal discharge Cardiovascular: RRR. No M/R/G Respiratory: CTAB, normal WOB on RA. No wheezing, crackles, rhonchi, or diminished breath sounds. Abdomen: Soft, non-tender, non-distended. Bowel sounds normoactive Extremities: +Tinels L>R. + Phalen's test. L wrist swollen at the base of the thumb. R dorsal aspect has swelling. Tender to light palpation over bilateral hands. No BLE edema, no deformities or significant joint findings. Skin: Warm and dry.  ASSESSMENT/PLAN:   Peripheral neuropathy Chronic uncontrolled neuropathic pain in bilateral hands. This has been ongoing for  about 6 months and conservative measures are no longer helping. She is complaining of severe pain today which is preventing her from performing her daily activities. Gabapentin/Pregabalin have worsened her symptoms in the past.  - Trial Oxycodone 5 mg BID for 5 days PRN for breakthrough pain augmented with Tylenol - Labs: Anemia Profile B, TSH - Referral to neurology   Fortunato Curling, DO [facility]

## 2023-07-18 NOTE — Patient Instructions (Addendum)
It was great to see you today! Thank you for choosing [facility] for your primary care. Kimberly [name] was seen for bilateral hand pain.  Today we addressed: We are reviewing some labs to see if they are contributing to your hand pain.  We provided a referral to neurology so they can assess your nerves for possible carpal tunnel syndrome. Prescribed Oxycodone 5 mg to take up to two times a day for BREAKTHROUGH pain. Augment with Tylenol to moderate your symptoms.   If you haven't already, sign up for My Chart to have easy access to your labs results, and communication with your primary care physician.  We are checking some labs today, I will send you a MyChart message (if it is active) or a letter in the mail. If you do not hear about your labs in the next 2 weeks, please call the office.  You should return to our clinic Return if symptoms worsen or fail to improve. Please arrive 15 minutes before your appointment to ensure smooth check in process.  We appreciate your efforts in making this happen.  Thank you for allowing me to participate in your care, Fortunato Curling, DO 07/18/2023, 4:11 PM PGY-1, [facility]

## 2023-07-18 NOTE — Assessment & Plan Note (Addendum)
Chronic uncontrolled neuropathic pain in bilateral hands. This has been ongoing for about 6 months and conservative measures are no longer helping. She is complaining of severe pain today which is preventing her from performing her daily activities. Gabapentin/Pregabalin have worsened her symptoms in the past.  - Trial Oxycodone 5 mg BID for 5 days PRN for breakthrough pain augmented with Tylenol - Labs: Anemia Profile B, TSH - Referral to neurology

## 2023-07-19 LAB — ANEMIA PROFILE B
Basophils Absolute: 0.1 10*3/uL (ref 0.0–0.2)
Basos: 1 %
EOS (ABSOLUTE): 0 10*3/uL (ref 0.0–0.4)
Eos: 0 %
Ferritin: 9 ng/mL — ABNORMAL LOW (ref 15–150)
Folate: 5.2 ng/mL (ref >3.0)
Hematocrit: 35.4 % (ref 34.0–46.6)
Hemoglobin: 11.4 g/dL (ref 11.1–15.9)
Immature Grans (Abs): 0 10*3/uL (ref 0.0–0.1)
Immature Granulocytes: 0 %
Iron Saturation: 6 % — CL (ref 15–55)
Iron: 24 ug/dL — ABNORMAL LOW (ref 27–159)
Lymphocytes Absolute: 2.4 10*3/uL (ref 0.7–3.1)
Lymphs: 40 %
MCH: 28.2 pg (ref 26.6–33.0)
MCHC: 32.2 g/dL (ref 31.5–35.7)
MCV: 88 fL (ref 79–97)
Monocytes Absolute: 0.4 10*3/uL (ref 0.1–0.9)
Monocytes: 7 %
Neutrophils Absolute: 3.2 10*3/uL (ref 1.4–7.0)
Neutrophils: 52 %
Platelets: 223 10*3/uL (ref 150–450)
RBC: 4.04 x10E6/uL (ref 3.77–5.28)
RDW: 18.8 % — ABNORMAL HIGH (ref 11.7–15.4)
Retic Ct Pct: 1.7 % (ref 0.6–2.6)
Total Iron Binding Capacity: 386 ug/dL (ref 250–450)
UIBC: 362 ug/dL (ref 131–425)
Vitamin B-12: 228 pg/mL — ABNORMAL LOW (ref 232–1245)
WBC: 6.1 10*3/uL (ref 3.4–10.8)

## 2023-07-19 LAB — TSH: TSH: 18.1 u[IU]/mL — ABNORMAL HIGH (ref 0.450–4.500)

## 2023-07-19 NOTE — Addendum Note (Signed)
Addended by: Jennette Bill on: 07/19/2023 10:44 AM   Modules accepted: Orders

## 2023-07-23 ENCOUNTER — Encounter: Payer: Self-pay | Admitting: Neurology

## 2023-07-23 DIAGNOSIS — M9903 Segmental and somatic dysfunction of lumbar region: Secondary | ICD-10-CM | POA: Diagnosis not present

## 2023-07-23 DIAGNOSIS — M25511 Pain in right shoulder: Secondary | ICD-10-CM | POA: Diagnosis not present

## 2023-07-23 DIAGNOSIS — M9902 Segmental and somatic dysfunction of thoracic region: Secondary | ICD-10-CM | POA: Diagnosis not present

## 2023-07-23 DIAGNOSIS — M47816 Spondylosis without myelopathy or radiculopathy, lumbar region: Secondary | ICD-10-CM | POA: Diagnosis not present

## 2023-07-23 DIAGNOSIS — M25552 Pain in left hip: Secondary | ICD-10-CM | POA: Diagnosis not present

## 2023-07-23 DIAGNOSIS — M9901 Segmental and somatic dysfunction of cervical region: Secondary | ICD-10-CM | POA: Diagnosis not present

## 2023-07-23 DIAGNOSIS — M4723 Other spondylosis with radiculopathy, cervicothoracic region: Secondary | ICD-10-CM | POA: Diagnosis not present

## 2023-07-23 DIAGNOSIS — M25512 Pain in left shoulder: Secondary | ICD-10-CM | POA: Diagnosis not present

## 2023-07-23 DIAGNOSIS — M25551 Pain in right hip: Secondary | ICD-10-CM | POA: Diagnosis not present

## 2023-07-24 DIAGNOSIS — M25511 Pain in right shoulder: Secondary | ICD-10-CM | POA: Diagnosis not present

## 2023-07-24 DIAGNOSIS — M9902 Segmental and somatic dysfunction of thoracic region: Secondary | ICD-10-CM | POA: Diagnosis not present

## 2023-07-24 DIAGNOSIS — M25512 Pain in left shoulder: Secondary | ICD-10-CM | POA: Diagnosis not present

## 2023-07-24 DIAGNOSIS — M47816 Spondylosis without myelopathy or radiculopathy, lumbar region: Secondary | ICD-10-CM | POA: Diagnosis not present

## 2023-07-24 DIAGNOSIS — M25552 Pain in left hip: Secondary | ICD-10-CM | POA: Diagnosis not present

## 2023-07-24 DIAGNOSIS — M9901 Segmental and somatic dysfunction of cervical region: Secondary | ICD-10-CM | POA: Diagnosis not present

## 2023-07-24 DIAGNOSIS — M25551 Pain in right hip: Secondary | ICD-10-CM | POA: Diagnosis not present

## 2023-07-24 DIAGNOSIS — M4723 Other spondylosis with radiculopathy, cervicothoracic region: Secondary | ICD-10-CM | POA: Diagnosis not present

## 2023-07-24 DIAGNOSIS — M9903 Segmental and somatic dysfunction of lumbar region: Secondary | ICD-10-CM | POA: Diagnosis not present

## 2023-07-25 DIAGNOSIS — M25511 Pain in right shoulder: Secondary | ICD-10-CM | POA: Diagnosis not present

## 2023-07-25 DIAGNOSIS — M25551 Pain in right hip: Secondary | ICD-10-CM | POA: Diagnosis not present

## 2023-07-25 DIAGNOSIS — M9901 Segmental and somatic dysfunction of cervical region: Secondary | ICD-10-CM | POA: Diagnosis not present

## 2023-07-25 DIAGNOSIS — M25552 Pain in left hip: Secondary | ICD-10-CM | POA: Diagnosis not present

## 2023-07-25 DIAGNOSIS — M47816 Spondylosis without myelopathy or radiculopathy, lumbar region: Secondary | ICD-10-CM | POA: Diagnosis not present

## 2023-07-25 DIAGNOSIS — M4723 Other spondylosis with radiculopathy, cervicothoracic region: Secondary | ICD-10-CM | POA: Diagnosis not present

## 2023-07-25 DIAGNOSIS — M9902 Segmental and somatic dysfunction of thoracic region: Secondary | ICD-10-CM | POA: Diagnosis not present

## 2023-07-25 DIAGNOSIS — M9903 Segmental and somatic dysfunction of lumbar region: Secondary | ICD-10-CM | POA: Diagnosis not present

## 2023-07-25 DIAGNOSIS — M25512 Pain in left shoulder: Secondary | ICD-10-CM | POA: Diagnosis not present

## 2023-07-26 ENCOUNTER — Telehealth: Payer: Self-pay | Admitting: Family Medicine

## 2023-07-26 DIAGNOSIS — E039 Hypothyroidism, unspecified: Secondary | ICD-10-CM

## 2023-07-26 MED ORDER — LEVOTHYROXINE SODIUM 50 MCG PO TABS
50.0000 ug | ORAL_TABLET | ORAL | 0 refills | Status: DC
Start: 2023-07-26 — End: 2023-08-27

## 2023-07-26 MED ORDER — LEVOTHYROXINE SODIUM 50 MCG PO TABS
50.0000 ug | ORAL_TABLET | ORAL | 3 refills | Status: DC
Start: 2023-07-26 — End: 2023-07-26

## 2023-07-26 NOTE — Telephone Encounter (Addendum)
Called patient and left a HIPAA compliant message regarding her lab results.  Advised patient to check MyChart message where I discussed that I we will start her on levothyroxine due to her thyroid function.  Advised patient to call back if she has any questions and to make an appointment within 4-6 weeks of starting the medication to recheck her thyroid function.  Fortunato Curling, DO [facility], PGY-1 07/26/23 11:54 AM  Service pager [phone]

## 2023-07-29 NOTE — Telephone Encounter (Signed)
Beth,  Please let me know what you find out about this patient's video capsule study approval or denial.  If it is denied, we need a document from her insurance explaining the reason. If peer-to-peer is needed, I could most likely do it on October 15, 16th or 17th as long as it can be done by appointment rather than random call to the office when I will be in clinic or procedures. I cannot do a peer to peer this week since I am in the hospital all week managing the inpatient consult service, and I am out of the office on October 14 and 18.  H Danis

## 2023-07-29 NOTE — Telephone Encounter (Signed)
I checked the procedure online and it looks to be denied. Please advise if a peer to peer needs to be setup.

## 2023-07-30 NOTE — Telephone Encounter (Signed)
I am out of the office this week while on the hospital consult service.  I would appreciate you printing the denial and putting it in my inbox for review when I return.    Thanks  - HD

## 2023-07-30 NOTE — Telephone Encounter (Signed)
Spoke with [facility] rep who informed me the next level of review now would be an appeal through the health plan directly sine the vase has been denied which could take up to 30 days to review. The denial rationale has been scanned into the pt's Careers information officer. Aside from clinicals the provider or nurse can fax information stating why the appeal needs to be started.  Fax: [phone] On the cover sheet make attention to [facility] and Appeals Department

## 2023-07-31 DIAGNOSIS — M25551 Pain in right hip: Secondary | ICD-10-CM | POA: Diagnosis not present

## 2023-07-31 DIAGNOSIS — M9903 Segmental and somatic dysfunction of lumbar region: Secondary | ICD-10-CM | POA: Diagnosis not present

## 2023-07-31 DIAGNOSIS — M9902 Segmental and somatic dysfunction of thoracic region: Secondary | ICD-10-CM | POA: Diagnosis not present

## 2023-07-31 DIAGNOSIS — M25552 Pain in left hip: Secondary | ICD-10-CM | POA: Diagnosis not present

## 2023-07-31 DIAGNOSIS — M25511 Pain in right shoulder: Secondary | ICD-10-CM | POA: Diagnosis not present

## 2023-07-31 DIAGNOSIS — M47816 Spondylosis without myelopathy or radiculopathy, lumbar region: Secondary | ICD-10-CM | POA: Diagnosis not present

## 2023-07-31 DIAGNOSIS — M4723 Other spondylosis with radiculopathy, cervicothoracic region: Secondary | ICD-10-CM | POA: Diagnosis not present

## 2023-07-31 DIAGNOSIS — M9901 Segmental and somatic dysfunction of cervical region: Secondary | ICD-10-CM | POA: Diagnosis not present

## 2023-07-31 DIAGNOSIS — M25512 Pain in left shoulder: Secondary | ICD-10-CM | POA: Diagnosis not present

## 2023-08-01 DIAGNOSIS — M9902 Segmental and somatic dysfunction of thoracic region: Secondary | ICD-10-CM | POA: Diagnosis not present

## 2023-08-01 DIAGNOSIS — M25552 Pain in left hip: Secondary | ICD-10-CM | POA: Diagnosis not present

## 2023-08-01 DIAGNOSIS — M4723 Other spondylosis with radiculopathy, cervicothoracic region: Secondary | ICD-10-CM | POA: Diagnosis not present

## 2023-08-01 DIAGNOSIS — M25511 Pain in right shoulder: Secondary | ICD-10-CM | POA: Diagnosis not present

## 2023-08-01 DIAGNOSIS — M47816 Spondylosis without myelopathy or radiculopathy, lumbar region: Secondary | ICD-10-CM | POA: Diagnosis not present

## 2023-08-01 DIAGNOSIS — M25512 Pain in left shoulder: Secondary | ICD-10-CM | POA: Diagnosis not present

## 2023-08-01 DIAGNOSIS — M25551 Pain in right hip: Secondary | ICD-10-CM | POA: Diagnosis not present

## 2023-08-01 DIAGNOSIS — M9903 Segmental and somatic dysfunction of lumbar region: Secondary | ICD-10-CM | POA: Diagnosis not present

## 2023-08-01 DIAGNOSIS — M9901 Segmental and somatic dysfunction of cervical region: Secondary | ICD-10-CM | POA: Diagnosis not present

## 2023-08-06 NOTE — Telephone Encounter (Signed)
Beth,  I reviewed [employer]'s denial of the requested video capsule study, which was planned for iron deficiency anemia and heme positive stool.  It reads that the requested video capsule study (CPT [phone]) was denied for the following reasons;  "The request for the genetic test to identify genetic mutations, comprehensive genomic profiling ([employer]), does not meet the requirements for approval. We reviewed your records, and they show cancer of the rectum.  The requested test does not have an approval by a special diagnostic program."   I cannot begin to understand how [employer] has completely misunderstood this patient's history and misinterpreted our request for a video capsule study for iron deficiency anemia and heme positive stool.  At this point, I do not know in what fashion to communicate with them about this request because they have denied a peer to peer review and completely misunderstood the entire case.   If there is a mechanism for sending a new request for authorization or an appeal, please do so.  Please send the most recent office note clearly outlining this patient's anemia and GI bleeding.  Ellwood Dense, MD

## 2023-08-06 NOTE — Telephone Encounter (Signed)
Dr Myrtie Neither,  [facility] has overturned their decision. No explanation. The patient will come for her capsule study 08/08/23.

## 2023-08-07 ENCOUNTER — Inpatient Hospital Stay: Payer: Medicare HMO | Attending: Oncology

## 2023-08-07 ENCOUNTER — Inpatient Hospital Stay: Payer: Medicare HMO

## 2023-08-07 VITALS — BP 135/83 | HR 97 | Temp 98.2°F | Resp 18

## 2023-08-07 DIAGNOSIS — C50412 Malignant neoplasm of upper-outer quadrant of left female breast: Secondary | ICD-10-CM | POA: Diagnosis not present

## 2023-08-07 DIAGNOSIS — Z5111 Encounter for antineoplastic chemotherapy: Secondary | ICD-10-CM | POA: Insufficient documentation

## 2023-08-07 DIAGNOSIS — D509 Iron deficiency anemia, unspecified: Secondary | ICD-10-CM

## 2023-08-07 DIAGNOSIS — D508 Other iron deficiency anemias: Secondary | ICD-10-CM

## 2023-08-07 LAB — CBC WITH DIFFERENTIAL (CANCER CENTER ONLY)
Abs Immature Granulocytes: 0.02 10*3/uL (ref 0.00–0.07)
Basophils Absolute: 0 10*3/uL (ref 0.0–0.1)
Basophils Relative: 0 %
Eosinophils Absolute: 0 10*3/uL (ref 0.0–0.5)
Eosinophils Relative: 0 %
HCT: 35.3 % — ABNORMAL LOW (ref 36.0–46.0)
Hemoglobin: 11.4 g/dL — ABNORMAL LOW (ref 12.0–15.0)
Immature Granulocytes: 0 %
Lymphocytes Relative: 32 %
Lymphs Abs: 2.3 10*3/uL (ref 0.7–4.0)
MCH: 28.7 pg (ref 26.0–34.0)
MCHC: 32.3 g/dL (ref 30.0–36.0)
MCV: 88.9 fL (ref 80.0–100.0)
Monocytes Absolute: 0.6 10*3/uL (ref 0.1–1.0)
Monocytes Relative: 8 %
Neutro Abs: 4.2 10*3/uL (ref 1.7–7.7)
Neutrophils Relative %: 60 %
Platelet Count: 262 10*3/uL (ref 150–400)
RBC: 3.97 MIL/uL (ref 3.87–5.11)
RDW: 17.3 % — ABNORMAL HIGH (ref 11.5–15.5)
WBC Count: 7 10*3/uL (ref 4.0–10.5)
nRBC: 0 % (ref 0.0–0.2)

## 2023-08-07 LAB — CMP (CANCER CENTER ONLY)
ALT: 5 U/L (ref 0–44)
AST: 9 U/L — ABNORMAL LOW (ref 15–41)
Albumin: 4.3 g/dL (ref 3.5–5.0)
Alkaline Phosphatase: 75 U/L (ref 38–126)
Anion gap: 7 (ref 5–15)
BUN: 10 mg/dL (ref 6–20)
CO2: 27 mmol/L (ref 22–32)
Calcium: 9.8 mg/dL (ref 8.9–10.3)
Chloride: 104 mmol/L (ref 98–111)
Creatinine: 0.97 mg/dL (ref 0.44–1.00)
GFR, Estimated: >60 mL/min (ref >60)
Glucose, Bld: 89 mg/dL (ref 70–99)
Potassium: 3.7 mmol/L (ref 3.5–5.1)
Sodium: 138 mmol/L (ref 135–145)
Total Bilirubin: 0.4 mg/dL (ref 0.3–1.2)
Total Protein: 8 g/dL (ref 6.5–8.1)

## 2023-08-07 LAB — IRON AND IRON BINDING CAPACITY (CC-WL,HP ONLY)
Iron: 45 ug/dL (ref 28–170)
Saturation Ratios: 10 % — ABNORMAL LOW (ref 10.4–31.8)
TIBC: 459 ug/dL — ABNORMAL HIGH (ref 250–450)
UIBC: 414 ug/dL (ref 148–442)

## 2023-08-07 LAB — FERRITIN: Ferritin: 8 ng/mL — ABNORMAL LOW (ref 11–307)

## 2023-08-07 MED ORDER — FULVESTRANT 250 MG/5ML IM SOSY
500.0000 mg | PREFILLED_SYRINGE | Freq: Once | INTRAMUSCULAR | Status: AC
  Administered 2023-08-07: 500 mg via INTRAMUSCULAR
  Filled 2023-08-07: qty 10

## 2023-08-08 ENCOUNTER — Ambulatory Visit (INDEPENDENT_AMBULATORY_CARE_PROVIDER_SITE_OTHER): Payer: Medicare HMO

## 2023-08-08 DIAGNOSIS — D500 Iron deficiency anemia secondary to blood loss (chronic): Secondary | ICD-10-CM

## 2023-08-08 NOTE — Patient Instructions (Signed)
You may have clear liquids beginning at 10:30 am after ingesting the capsule.    You can have a light lunch at 12:30 pm; sandwich and half bowl of soup.  Return to the office at 4 pm to return the equipment.   Return to you normal diet at 5 pm.   Call [phone] and ask for Glendora Score, RN if you have any questions.  You should pass the capsule in your stool 8-48 hours after ingestion. If you have not passed the capsule, after 72 hours, please contact the office at [phone].

## 2023-08-08 NOTE — Progress Notes (Signed)
SN: [id number]  Exp: 09/11/2024  LOT: [id number]  Patient arrived for VCE. Reported the prep went well. This RN explained capsule dietary restrictions for the next few hours. Pt advised to return at 4 pm to return capsule equipment.  Patient verbalized understanding. Opened capsule, ensured capsule was flashing prior to the patient swallowing the capsule. Patient swallowed capsule without difficulty.  Patient told to call the office with any questions and if capsule has not passed after 72 hours. No further questions by the conclusion of the visit.

## 2023-08-13 ENCOUNTER — Encounter: Payer: Self-pay | Admitting: Neurology

## 2023-08-13 ENCOUNTER — Ambulatory Visit (INDEPENDENT_AMBULATORY_CARE_PROVIDER_SITE_OTHER): Payer: Medicare HMO | Admitting: Neurology

## 2023-08-13 VITALS — BP 137/87 | HR 114 | Ht 69.0 in | Wt 260.0 lb

## 2023-08-13 DIAGNOSIS — M79642 Pain in left hand: Secondary | ICD-10-CM | POA: Diagnosis not present

## 2023-08-13 DIAGNOSIS — M79641 Pain in right hand: Secondary | ICD-10-CM

## 2023-08-13 DIAGNOSIS — R200 Anesthesia of skin: Secondary | ICD-10-CM

## 2023-08-13 NOTE — Progress Notes (Signed)
[facility] Neurology Division Clinic Note - Initial Visit   Date: 08/13/2023   Kimberly [name] MRN: [id number] DOB: 1970-12-14   Dear Dr. Lum Babe:  Thank you for your kind referral of Kimberly [name] for consultation of hand pain. Although her history is well known to you, please allow [name] to reiterate it for the purpose of our medical record. The patient was accompanied to the clinic by self.    Kimberly [name] is a 52 y.o. right-handed female with recurrent left breast cancer s/p mastectomy, radiation, chemotherapy, anemia,  depression, GERD, hypothyroidism, and hyperlipidemia presenting for evaluation of bilateral hand pain.   IMPRESSION/PLAN: Bilateral hand pain and swelling.  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  Further, she has bilateral hand swelling which would not be seen with primary neurological condition.  - NCS/EMG of the hands to assess for entrapment neuropathy vs peripheral neuropathy.  - For pain, she has previously tried gabapentin and Lyrica without benefit.  Unable to use TCAs or duloxetine due to interaction with current medications.  She may need to see pain management based on results of EMG.   Further recommendations pending results.   ------------------------------------------------------------- History of present illness: Starting around February 2024, she began having numbness, burning, and stinging pain involving both hands and fingers.  Symptoms are constant and worse at night time and when driving.  She endorses weakness and has been dropping things. She did not tolerate gabapentin or Lyrica.  She was given limited supply of oxycodone which temporarily helped.  She has tried wrist braces which did not help.    Out-side paper records, electronic medical record, and images have been reviewed where available and  summarized as:  Lab Results  Component Value Date   HGBA1C 5.5 02/11/2020   Lab Results  Component Value Date   VITAMINB12 228 (L) 07/18/2023   Lab Results  Component Value Date   TSH 18.100 (H) 07/18/2023   Lab Results  Component Value Date   ESRSEDRATE 2 03/06/2017    Past Medical History:  Diagnosis Date   Acute cholecystitis 04/17/2019   Anemia    Anxiety    Arthritis    Bipolar 1 disorder (HCC)    Blood transfusion without reported diagnosis 2007   post bleeding childbirth   Cancer (HCC)    left breast cancer 2015   COPD (chronic obstructive pulmonary disease) (HCC)    pt reports she is not on oxygen   COVID-19 virus infection 06/07/2019   Depression    Diabetes mellitus without complication (HCC)    gestational   Hand injury, right, initial encounter 03/19/2019   Heart murmur    as a child-not adult-no cardiac work up   History of radiation therapy 08/31/14-10/21/14   left breast/ left supraclavicular 50.4 Gy 28 fx, lef tposterior axillary boost 9.52 Gy 28 fx, left rbeast boost/ 10 Gy 5 fx   Personal history of chemotherapy    Personal history of radiation therapy    Rash and nonspecific skin eruption 12/27/2020    Past Surgical History:  Procedure Laterality Date   BREAST BIOPSY Left 04/01/14   BREAST BIOPSY Right 04/19/14   BREAST LUMPECTOMY WITH AXILLARY LYMPH NODE DISSECTION Bilateral 05/03/14   CHOLECYSTECTOMY N/A 04/18/2019   Procedure: LAPAROSCOPIC CHOLECYSTECTOMY WITH INTRAOPERATIVE CHOLANGIOGRAM;  Surgeon: Griselda Miner, MD;  Location: [facility];  Service: General;  Laterality: N/A;   DILATION  AND CURETTAGE OF UTERUS     after childbirth   MASS EXCISION Right 07/25/2021   Procedure: RIGHT AXILLARY MASS EXCISION;  Surgeon: Emelia Loron, MD;  Location: [facility] OR;  Service: General;  Laterality: Right;   [name] BREAST BIOPSY Left 07/25/2021   Procedure: SKIN PUNCH BIOPSY LEFT BREAST;  Surgeon: Emelia Loron, MD;  Location: [facility] OR;  Service: General;   Laterality: Left;   MULTIPLE TOOTH EXTRACTIONS     only 5 left   PORTACATH PLACEMENT Right 06/18/2014   Procedure: INSERTION PORT-A-CATH;  Surgeon: Emelia Loron, MD;  Location: [facility];  Service: General;  Laterality: Right;   TONSILLECTOMY AND ADENOIDECTOMY     TOTAL MASTECTOMY Left 08/28/2021   Procedure: LEFT TOTAL MASTECTOMY;  Surgeon: Emelia Loron, MD;  Location: [facility] OR;  Service: General;  Laterality: Left;  90 MINUTES- POST PER KELLY AND SHE WILL PUT IN ROOM     Medications:  Outpatient Encounter Medications as of 08/13/2023  Medication Sig Note   albuterol (VENTOLIN HFA) 108 (90 Base) MCG/ACT inhaler Inhale 2 puffs into the lungs every 6 (six) hours as needed for wheezing or shortness of breath.    AMBULATORY NON FORMULARY MEDICATION Medication Name: Diltiazem 2%/ Lidocaine 5% Using your index finger, apply a small amount of medication inside the rectum up to your first knuckle/joint 4 daily x 8 weeks. 06/27/2022: Pt states she still has at home for when needed.   azithromycin (ZITHROMAX) 250 MG tablet Take 1 tablet (250 mg total) by mouth daily. Take first 2 tablets together, then 1 every day until finished.    busPIRone (BUSPAR) 15 MG tablet Take 15 mg by mouth 3 (three) times daily. 06/27/2022: Pt took from home supply night of 06/26/22 while in hospital.   dicyclomine (BENTYL) 10 MG capsule Take 1 capsule (10 mg total) by mouth 3 (three) times daily. For diarrhea and cramping    doxepin (SINEQUAN) 75 MG capsule Take 75 mg by mouth at bedtime.    ferrous sulfate 324 MG TBEC Take 1 tablet (324 mg total) by mouth daily.    Fulvestrant (FASLODEX IM) Inject 2 Doses into the muscle every 14 (fourteen) days. Every other Tuesday 06/27/2022: Pt states last infusion was about a month ago, she has forgotten to schedule appointment    hydrocortisone (ANUSOL-HC) 25 MG suppository Use every other night for 2-weeks until prescription is complete 06/27/2022: Pt states she has at  home if needed.   levothyroxine (SYNTHROID) 50 MCG tablet Take 1 tablet (50 mcg total) by mouth every morning. 30 minutes before food    lidocaine-prilocaine (EMLA) cream Apply 1 application topically as needed. (Patient taking differently: Apply 1 application  topically daily as needed (port access).) 06/27/2022: Pt states she still has at home, she no longer uses it for chemotherapy port access.   montelukast (SINGULAIR) 10 MG tablet Take 1 tablet (10 mg total) by mouth at bedtime. (Patient taking differently: Take 10 mg by mouth daily as needed (allergies).) 06/27/2022: Pt states she only uses this medication PRN and has not needed it lately. Can find no fill history for this medication per dispense report/Dr. First.    oxybutynin (DITROPAN) 5 MG tablet TAKE 1/2 TABLET(2.5 MG) BY MOUTH AT BEDTIME    pantoprazole (PROTONIX) 40 MG tablet Take 1 tablet (40 mg total) by mouth daily.    prazosin (MINIPRESS) 1 MG capsule Take 1 mg by mouth at bedtime.    prochlorperazine (COMPAZINE) 5 MG tablet TAKE 1 TABLET(5 MG) BY  MOUTH EVERY 6 HOURS AS NEEDED FOR VOMITING OR NAUSEA    QUEtiapine (SEROQUEL) 400 MG tablet Take 400 mg by mouth at bedtime.     rosuvastatin (CRESTOR) 10 MG tablet Take 1 tablet (10 mg total) by mouth daily. 06/27/2022: Pt states she still has this medication and still takes it. She can not tell me the last time she took it. LF 09/18/2021 #90, 90 DS.   Tiotropium Bromide Monohydrate (SPIRIVA RESPIMAT) 1.25 MCG/ACT AERS Inhale 2 puffs into the lungs daily. 06/27/2022: Pt has this medication, but has never used it. She does not know how to use this type of inhaler.   PE-Doxylamine-DM-GG-APAP (VICKS DAYQUIL/NYQUIL SEVERE PO) Take 30 mLs by mouth at bedtime as needed (cold symptoms). (Patient not taking: Reported on 08/13/2023)    pregabalin (LYRICA) 25 MG capsule Take 1 capsule (25 mg total) by mouth at bedtime. (Patient not taking: Reported on 08/13/2023)    Pseudoeph-CPM-DM-APAP (TYLENOL COLD & FLU  DAY/NIGHT PO) Take 2 capsules by mouth at bedtime as needed (cold symptoms). (Patient not taking: Reported on 08/13/2023)    [DISCONTINUED] ondansetron (ZOFRAN) 8 MG tablet Take 1 tablet (8 mg total) by mouth every 8 (eight) hours as needed for nausea or vomiting. (Patient not taking: Reported on 08/13/2023)    No facility-administered encounter medications on file as of 08/13/2023.    Allergies: No Known Allergies  Family History: Family History  Problem Relation Age of Onset   Heart disease Father    Cancer Father        Prostate   Cancer Maternal Grandmother    Depression Maternal Grandfather    Cancer Paternal Grandmother    Stomach cancer Neg Hx    Esophageal cancer Neg Hx    Rectal cancer Neg Hx     Social History: Social History   Tobacco Use   Smoking status: Every Day    Current packs/day: 0.50    Average packs/day: 0.5 packs/day for 34.0 years (17.0 ttl pk-yrs)    Types: Cigarettes   Smokeless tobacco: Never  Vaping Use   Vaping status: Never Used  Substance Use Topics   Alcohol use: No   Drug use: No   Social History   Social History Narrative   Patient lives in [place] with 3 of her children.    One adult son lives "up the road" and one lives in [place].   Patient is on disability.    Patient enjoys spending time with her family and watching TV.    Patient walks around her home for exercise.       Are you right handed or left handed? Right handed   Are you currently employed ? Disability    What is your current occupation?   Do you live at home alone? Lives with son   Who lives with you?    What type of home do you live in: 1 story or 2 story? Lives in a two story home        Vital Signs:  BP 137/87   Pulse (!) 114   Ht 5\' 9"  (1.753 m)   Wt 260 lb (117.9 kg)   LMP 07/13/2021 (Approximate)   SpO2 94%   BMI 38.40 kg/m    Neurological Exam: MENTAL STATUS including orientation to time, place, person, recent and remote memory, attention  span and concentration, language, and fund of knowledge is normal.  Speech is not dysarthric.  CRANIAL NERVES: II:  No visual field defects.  III-IV-VI: Pupils equal round and reactive to light.  Normal conjugate, extra-ocular eye movements in all directions of gaze.  No nystagmus.  No ptosis.   V:  Normal facial sensation.    VII:  Normal facial symmetry and movements.   VIII:  Normal hearing and vestibular function.   IX-X:  Normal palatal movement.   XI:  Normal shoulder shrug and head rotation.   XII:  Normal tongue strength and range of motion, no deviation or fasciculation.  MOTOR: Motor strength is 5/5 throughout. No atrophy, fasciculations or abnormal movements.  No pronator drift.   MSRs:                                           Right        Left brachioradialis 2+  2+  biceps 2+  2+  triceps 2+  2+  patellar 2+  2+  ankle jerk 1+  1+  Hoffman no  no  plantar response down  down   SENSORY:  Normal and symmetric perception of light touch, pinprick, vibration, and proprioception.  Tinel's sign is positive at the wrists bilaterally.   COORDINATION/GAIT: Normal finger-to- nose-finger.  Finger tapping is slowed due to pain. Gait appears mildly antalgic, unassisted and stable.    Thank you for allowing me to participate in patient's care.  If I can answer any additional questions, I would be pleased to do so.    Sincerely,    Carrie Schoonmaker K. Allena Katz, DO

## 2023-08-13 NOTE — Patient Instructions (Signed)
Nerve testing of the hands  ELECTROMYOGRAM AND NERVE CONDUCTION STUDIES (EMG/NCS) INSTRUCTIONS  How to Prepare The neurologist conducting the EMG will need to know if you have certain medical conditions. Tell the neurologist and other EMG lab personnel if you: Have a pacemaker or any other electrical medical device Take blood-thinning medications Have hemophilia, a blood-clotting disorder that causes prolonged bleeding Bathing Take a shower or bath shortly before your exam in order to remove oils from your skin. Don't apply lotions or creams before the exam.  What to Expect You'll likely be asked to change into a hospital gown for the procedure and lie down on an examination table. The following explanations can help you understand what will happen during the exam.  Electrodes. The neurologist or a technician places surface electrodes at various locations on your skin depending on where you're experiencing symptoms. Or the neurologist may insert needle electrodes at different sites depending on your symptoms.  Sensations. The electrodes will at times transmit a tiny electrical current that you may feel as a twinge or spasm. The needle electrode may cause discomfort or pain that usually ends shortly after the needle is removed. If you are concerned about discomfort or pain, you may want to talk to the neurologist about taking a short break during the exam.  Instructions. During the needle EMG, the neurologist will assess whether there is any spontaneous electrical activity when the muscle is at rest - activity that isn't present in healthy muscle tissue - and the degree of activity when you slightly contract the muscle.  He or she will give you instructions on resting and contracting a muscle at appropriate times. Depending on what muscles and nerves the neurologist is examining, he or she may ask you to change positions during the exam.  After your EMG You may experience some temporary, minor  bruising where the needle electrode was inserted into your muscle. This bruising should fade within several days. If it persists, contact your primary care doctor.   

## 2023-08-14 ENCOUNTER — Telehealth: Payer: Self-pay | Admitting: Gastroenterology

## 2023-08-14 NOTE — Telephone Encounter (Signed)
Recent video capsule study reviewed.  No active bleeding seen anywhere.  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  It is possible that is contributing to some microscopic blood loss, but it is beyond the reach of the scope. Upper intestine just past the stomach has some nonspecific swelling of the tissue fold, does not look ulcerated and probably not source of blood loss.  It has been 2 years since last upper endoscopy, so when I see her next in the office we will talk about scheduling an upper endoscopy to examine that area again.  I suspect the frequent hemorrhoidal bleeding is the most likely cause of iron deficiency anemia.  She has an appointment scheduled with me in several weeks for an examination and probable first banding session. We will also recheck her blood counts and iron levels at that point since she recently had an IV iron treatment.   - H. Danis

## 2023-08-15 NOTE — Telephone Encounter (Signed)
Left message for patient to call back  

## 2023-08-16 ENCOUNTER — Encounter: Payer: Self-pay | Admitting: Gastroenterology

## 2023-08-16 LAB — T3, FREE: T3, Free: 2.8 pg/mL (ref 2.0–4.4)

## 2023-08-16 LAB — SPECIMEN STATUS REPORT

## 2023-08-16 LAB — T4, FREE: Free T4: 0.68 ng/dL — ABNORMAL LOW (ref 0.82–1.77)

## 2023-08-16 NOTE — Telephone Encounter (Signed)
Left message for patient to call back  

## 2023-08-16 NOTE — Telephone Encounter (Signed)
Spoke with patient regarding results & recommendations. Pt verbalized all understanding & had no further questions.

## 2023-08-23 ENCOUNTER — Encounter: Payer: Self-pay | Admitting: Family Medicine

## 2023-08-23 ENCOUNTER — Ambulatory Visit (INDEPENDENT_AMBULATORY_CARE_PROVIDER_SITE_OTHER): Payer: Medicare HMO | Admitting: Family Medicine

## 2023-08-23 VITALS — BP 159/89 | HR 98 | Ht 69.0 in | Wt 264.4 lb

## 2023-08-23 DIAGNOSIS — M79641 Pain in right hand: Secondary | ICD-10-CM

## 2023-08-23 DIAGNOSIS — M79642 Pain in left hand: Secondary | ICD-10-CM

## 2023-08-23 DIAGNOSIS — E039 Hypothyroidism, unspecified: Secondary | ICD-10-CM

## 2023-08-23 MED ORDER — TRAMADOL HCL 50 MG PO TABS: 50.0000 mg | ORAL_TABLET | Freq: Every day | ORAL | 0 refills | Status: DC | PRN

## 2023-08-23 NOTE — Patient Instructions (Signed)
Good to see you today - Thank you for coming in  Things we discussed today:  1) For your hand pain - Take Tramadol 50mg  once a day at nighttime. I will give you a supply for the next 2 weeks - I will also refer you to see Physical Medicine Rehabilitation doctor. They can help with pain control. - Follow-up with your Neurologist  2) We will check your thyroid level. - Continue taking your levothyroxine  - Continue taking your daily iron supplement

## 2023-08-23 NOTE — Progress Notes (Signed)
    SUBJECTIVE:   CHIEF COMPLAINT / HPI:   BB is a 52yo F w/ hx of IDA, chronic pain in BL hands, hypothyroidism, breast cancer on chemo that p/f conintued pain in BL hands.   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  - saw neurology, they plan to do nerve study later this month - Pain in hand is causing difficulty with putting sheets on her bed, can't tie her hair off - Doing cold compresses, warm soaks, epsom salt soaks.  Feels like this is not helpful. - Tried tylenol and motrin, but still having pain.  Also stopped taking NSAIDs because of concern of potential GI bleed. - Was started on oxycodone at last visit with Dr. Fatima Blank for 5 days. Oxycodone helped, but wore off quickly - pregabalin gabapentin not helpful, it made the pain worse.  - Reports pain is worse a tnight and she feels like her hands swell up and she can't forma  fist. It is better during the day.   Hypothyroidism - Was started on Synthroid at last visit.  Reports taking Synthroid consistently.  Social Hx - Also planning to move to [place] soon - Her niece was murdered last year, and her nephew was recently shot but alive.  She is moving to [place] to help family.    OBJECTIVE:   BP (!) 159/89   Pulse 98   Ht 5\' 9"  (1.753 m)   Wt 264 lb 6.4 oz (119.9 kg)   LMP 07/13/2021 (Approximate)   SpO2 100%   BMI 39.05 kg/m   General: Alert, pleasant woman. NAD. HEENT: NCAT. MMM. CV: RRR, no murmurs.  Resp: CTAB, no wheezing or crackles. Normal WOB on RA.  Abm: Soft, nontender, nondistended. BS present. Skin: Warm, well perfused  Msk: Tenderness of L thumb and radial side of wrist. Able to make fists in both hands  ASSESSMENT/PLAN:   Assessment & Plan Hypothyroidism, unspecified type Started on Synthroid at last visit.  Will check TSH today.  Plan to adjust dose as needed. Pain in both  hands Chronic and uncontrolled. Currently has a 3wk acute flare of the pain along her L thumb and wrist. Differential includes carpal tunnel, chemotherapy side effects, uncontrolled hypothyroidism, IDA. Pt was seen by Neuro 10/22 for this, and plan to do nerve conduction study later this month. Her chemotherapy for breast cancer can contribute to neuropathic pain. Her hypothyroidism and IDA also can contirbute to neuropathic pain. Oxy was helpful, but did not last long. Plan to transition to Tramadol nightly for longer duration of action and symptoms seem to be worse at night. Encouraged to continue tylenol. Also refer to PM&R for further evaluation as well. - Trmadol 50mg  night for 14days - Tylenol prn - Supportive management.  - Referral to PM&R - Treat IDA (on daily iron and recently got IV iron) and hypothyroidism     [name], MD [facility]

## 2023-08-23 NOTE — Assessment & Plan Note (Signed)
Started on Synthroid at last visit.  Will check TSH today.  Plan to adjust dose as needed.

## 2023-08-23 NOTE — Assessment & Plan Note (Signed)
Chronic and uncontrolled. Currently has a 3wk acute flare of the pain along her L thumb and wrist. Differential includes carpal tunnel, chemotherapy side effects, uncontrolled hypothyroidism, IDA. Pt was seen by Neuro 10/22 for this, and plan to do nerve conduction study later this month. Her chemotherapy for breast cancer can contribute to neuropathic pain. Her hypothyroidism and IDA also can contirbute to neuropathic pain. Oxy was helpful, but did not last long. Plan to transition to Tramadol nightly for longer duration of action and symptoms seem to be worse at night. Encouraged to continue tylenol. Also refer to PM&R for further evaluation as well. - Trmadol 50mg  night for 14days - Tylenol prn - Supportive management.  - Referral to PM&R - Treat IDA (on daily iron and recently got IV iron) and hypothyroidism

## 2023-08-24 LAB — TSH: TSH: 19 u[IU]/mL — ABNORMAL HIGH (ref 0.450–4.500)

## 2023-08-26 ENCOUNTER — Telehealth: Payer: Self-pay | Admitting: Adult Health

## 2023-08-26 NOTE — Telephone Encounter (Signed)
Spoke with patient confirming upcoming appointment  

## 2023-08-27 ENCOUNTER — Telehealth: Payer: Self-pay | Admitting: Family Medicine

## 2023-08-27 DIAGNOSIS — E039 Hypothyroidism, unspecified: Secondary | ICD-10-CM

## 2023-08-27 MED ORDER — LEVOTHYROXINE SODIUM 75 MCG PO TABS
75.0000 ug | ORAL_TABLET | ORAL | 1 refills | Status: DC
Start: 2023-08-27 — End: 2024-06-08

## 2023-08-27 NOTE — Telephone Encounter (Signed)
Called patient to inform her that her TSH is still elevated.  I recommended increasing levothyroxine from 50 mcg to 75 mcg daily.  Patient is agreeable.  Recommended to follow-up in 2 months for repeat TSH.

## 2023-09-02 ENCOUNTER — Inpatient Hospital Stay: Payer: Medicare HMO | Attending: Oncology | Admitting: Adult Health

## 2023-09-02 ENCOUNTER — Encounter: Payer: Self-pay | Admitting: Adult Health

## 2023-09-02 ENCOUNTER — Inpatient Hospital Stay: Payer: Medicare HMO

## 2023-09-02 ENCOUNTER — Other Ambulatory Visit: Payer: Self-pay

## 2023-09-02 VITALS — BP 130/65 | HR 115 | Temp 98.2°F | Resp 18 | Ht 69.0 in | Wt 260.9 lb

## 2023-09-02 DIAGNOSIS — D509 Iron deficiency anemia, unspecified: Secondary | ICD-10-CM | POA: Insufficient documentation

## 2023-09-02 DIAGNOSIS — C50412 Malignant neoplasm of upper-outer quadrant of left female breast: Secondary | ICD-10-CM | POA: Insufficient documentation

## 2023-09-02 DIAGNOSIS — C792 Secondary malignant neoplasm of skin: Secondary | ICD-10-CM | POA: Insufficient documentation

## 2023-09-02 DIAGNOSIS — D500 Iron deficiency anemia secondary to blood loss (chronic): Secondary | ICD-10-CM | POA: Diagnosis not present

## 2023-09-02 DIAGNOSIS — C50912 Malignant neoplasm of unspecified site of left female breast: Secondary | ICD-10-CM | POA: Diagnosis not present

## 2023-09-02 DIAGNOSIS — E039 Hypothyroidism, unspecified: Secondary | ICD-10-CM | POA: Insufficient documentation

## 2023-09-02 DIAGNOSIS — Z5111 Encounter for antineoplastic chemotherapy: Secondary | ICD-10-CM | POA: Insufficient documentation

## 2023-09-02 DIAGNOSIS — Z170 Estrogen receptor positive status [ER+]: Secondary | ICD-10-CM | POA: Diagnosis not present

## 2023-09-02 DIAGNOSIS — Z79890 Hormone replacement therapy (postmenopausal): Secondary | ICD-10-CM | POA: Diagnosis not present

## 2023-09-02 LAB — CMP (CANCER CENTER ONLY)
ALT: 5 U/L (ref 0–44)
AST: 9 U/L — ABNORMAL LOW (ref 15–41)
Albumin: 4.4 g/dL (ref 3.5–5.0)
Alkaline Phosphatase: 76 U/L (ref 38–126)
Anion gap: 7 (ref 5–15)
BUN: 6 mg/dL (ref 6–20)
CO2: 29 mmol/L (ref 22–32)
Calcium: 10 mg/dL (ref 8.9–10.3)
Chloride: 102 mmol/L (ref 98–111)
Creatinine: 1.06 mg/dL — ABNORMAL HIGH (ref 0.44–1.00)
GFR, Estimated: >60 mL/min (ref >60)
Glucose, Bld: 94 mg/dL (ref 70–99)
Potassium: 4 mmol/L (ref 3.5–5.1)
Sodium: 138 mmol/L (ref 135–145)
Total Bilirubin: 0.4 mg/dL (ref <1.2)
Total Protein: 8.1 g/dL (ref 6.5–8.1)

## 2023-09-02 LAB — CBC WITH DIFFERENTIAL (CANCER CENTER ONLY)
Abs Immature Granulocytes: 0.03 10*3/uL (ref 0.00–0.07)
Basophils Absolute: 0 10*3/uL (ref 0.0–0.1)
Basophils Relative: 1 %
Eosinophils Absolute: 0.2 10*3/uL (ref 0.0–0.5)
Eosinophils Relative: 2 %
HCT: 34.7 % — ABNORMAL LOW (ref 36.0–46.0)
Hemoglobin: 11.1 g/dL — ABNORMAL LOW (ref 12.0–15.0)
Immature Granulocytes: 0 %
Lymphocytes Relative: 38 %
Lymphs Abs: 2.7 10*3/uL (ref 0.7–4.0)
MCH: 28.5 pg (ref 26.0–34.0)
MCHC: 32 g/dL (ref 30.0–36.0)
MCV: 89.2 fL (ref 80.0–100.0)
Monocytes Absolute: 0.4 10*3/uL (ref 0.1–1.0)
Monocytes Relative: 6 %
Neutro Abs: 3.7 10*3/uL (ref 1.7–7.7)
Neutrophils Relative %: 53 %
Platelet Count: 279 10*3/uL (ref 150–400)
RBC: 3.89 MIL/uL (ref 3.87–5.11)
RDW: 17.2 % — ABNORMAL HIGH (ref 11.5–15.5)
WBC Count: 7.1 10*3/uL (ref 4.0–10.5)
nRBC: 0 % (ref 0.0–0.2)

## 2023-09-02 LAB — IRON AND IRON BINDING CAPACITY (CC-WL,HP ONLY)
Iron: 21 ug/dL — ABNORMAL LOW (ref 28–170)
Saturation Ratios: 4 % — ABNORMAL LOW (ref 10.4–31.8)
TIBC: 486 ug/dL — ABNORMAL HIGH (ref 250–450)
UIBC: 465 ug/dL — ABNORMAL HIGH (ref 148–442)

## 2023-09-02 MED ORDER — FULVESTRANT 250 MG/5ML IM SOSY
500.0000 mg | PREFILLED_SYRINGE | Freq: Once | INTRAMUSCULAR | Status: AC
  Administered 2023-09-02: 500 mg via INTRAMUSCULAR
  Filled 2023-09-02: qty 10

## 2023-09-02 NOTE — Progress Notes (Signed)
[facility] Cancer Follow up:    Kimberly Brigham, MD [name] [place] [address]   DIAGNOSIS:  Cancer Staging  No matching staging information was found for the patient.   SUMMARY OF ONCOLOGIC HISTORY: Oncology History Overview Note  The patient had bilateral screening mammography with tomography 03/19/2014. This was the patient's first ever mammography. Showed a possible mass in the left breast. Left diagnostic mammography and ultrasonography 04/01/2014 showed an irregular mass in the upper outer quadrant of the left breast with a possible satellite 1 cm anterior to it. On physical exam there was a firm palpable mass at the 1:00 position of the left breast 8 cm from the nipple. There was no palpable left axillary adenopathy. Ultrasound showed an irregular hypoechoic mass in the area in question measuring 1.4 cm. There was a 4 mm nodule located anterior to this. Ultrasound of the left axilla was benign.  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 The second mass was negative for malignancy. This was felt to be concordant.  On 04/08/2014 the patient underwent bilateral breast MRI. This showed a 9 mm enhancing mass in the subareolar area of the right breast in in the left breast, the previously noted mass measuring 1.3 cm. There were no morphologically abnormal lymph nodes  The right breast finding was followed up on 04/19/2014 with ultrasound which showed an intraductal soft tissue mass in the inferior subareolar worsening of the right breast measuring 8 mm. This was biopsied 04/19/2014 and showed ([id number]) ectatic duct, with no evidence of malignancy. Surgical excision  was recommended.  Accordingly on 05/03/2014 the patient underwent right lumpectomy, showing an intraductal papilloma, with known malignancy identified. Left lumpectomy on the same day showed an invasive ductal carcinoma measuring 1.5 cm, with one of the 2 sentinel lymph nodes positive for carcinoma. There was no extracapsular extension. Margins were clear and ample. HER-2 was repeated and was again negative.  The patient's case was discussed in the multidisciplinary breast cancer conference 05/12/2014. An Oncotype had been previously requested and this showed a recurrent score of 22, in the intermediate range. The patient qualifies for the S1007 study and this will be discussed with her. Otherwise the standard recommendation would be chemotherapy followed by radiation followed by anti-estrogens. Oncotype score of 22 predicts a risk of outside the breast recurrence within 10 years of 13% if the patient's only systemic therapy is tamoxifen for 5 years.  (2) adjuvant chemotherapy with cyclophosphamide and docetaxel started 07/06/2014, patient tolerated chemotherapy very poorly and it was discontinued after one cycle.    (3) adjuvant radiation completed 10/21/2014 1) Left Breast / 50.4 Gy in 28 fractions 2) Left supraclavicular / 50.4 Gy in 28 fractions 3) Left Posterior axillary boost  / 9.52 Gy in 28 fractions 4) Left Breast Boost / 10 Gy in 5 fractions  (4) anastrozole started 01/19/2015-[name] informed me she never started this.   METASTATIC DISEASE to skin: September 2022 (5) mammography 05/24/2021 shows fluid collection in the right axilla, no obvious malignancy within each breast but superficial skin lesions throughout the upper outer left breast.  (A) CT scan of the abdomen and pelvis with contrast 06/10/2021 shows small low-attenuation liver  lesions which could be cysts but no definite evidence of metastatic disease.  (B) chest CT scan 07/14/2021 shows no evidence of metastatic  disease  (C) CT head with and without contrast on 07/15/2021 shows no evidence of metastatic disease (D) biopsy left breast skin lesion 07/25/2021 shows metastatic carcinoma, estrogen and progesterone receptor positive, HER2 not amplified (1+)  (E) excision right axillary mass 07/25/2021 shows benign epidermoid cyst (E) CA 27-29 on 07/05/2021 is normal at 21.1  (6) iron deficiency anemia: On 07/05/2021 the ferritin was less than 4 and the iron saturation 3%; hemoglobin was 9.1 with an MCV of 71.3  (A) Venofer started on 07/18/2021, to be repeated x5   (B) GI evaluation 08/11/2021  (7) to start fulvestrant 09/13/2021  (A) to start abemaciclib 50 mg twice daily starting 09/13/2021  (8) status post left mastectomy 08/28/2021 for a pT3 pNX invasive lobular carcinoma, grade 2, with negative margins.   Recurrent breast cancer, left (HCC)  04/14/2014 Initial Diagnosis   Recurrent breast cancer, left (HCC)     CURRENT THERAPY: Faslodex  INTERVAL HISTORY:  Discussed the use of AI scribe software for clinical note transcription with the patient, who gave verbal consent to proceed.  Kimberly [name] 52 y.o. female with a history of recurrent breast cancer, presents for a routine follow-up. She reports compliance with the prescribed Faslodex injections every four weeks. She was previously on anastrozole and Verzenio, but these were discontinued by Dr. Al Pimple in 04/2023 due to compliance issues and difficulty tolerating side effects.  The patient has not experienced any new or worsening symptoms related to her breast cancer.  The patient also reports chronic pain in her left thumb, which has been persistent for approximately two weeks. The pain is severe enough to limit her range of motion and is not relieved by oxycodone. She also mentions a diagnosis of hypothyroidism and is Levothyroxine daily.   The patient has been recommended to undergo a mammogram but has been hesitant due to fear of potential  findings. She expresses a desire for breast augmentation surgery in the future. The patient also mentions a history of diabetes, and follows up with her PCP regularly.   The patient's overall mood appears positive, with a strong support system from her children. She expresses a strong faith and belief in following what she feels is her divine path.   Patient Active Problem List   Diagnosis Date Noted   Hypothyroidism 08/23/2023   Pain in both hands 08/23/2023   Peripheral neuropathy 04/26/2023   Upper respiratory infection 11/28/2022   Paronychia of great toe of left foot 09/01/2022   Hypokalemia 06/28/2022   Acute respiratory failure with hypoxia (HCC)    Bipolar depression (HCC)    Chronic pain of right knee 03/08/2022   S/P mastectomy, left 08/28/2021   Goals of care, counseling/discussion 07/18/2021   Microcytic anemia 07/05/2021   History of COVID-19 01/27/2021   Elevated LDL cholesterol level 12/30/2020   COPD exacerbation (HCC) 08/24/2020   Oligouria 03/10/2020   History of breast cancer 02/11/2020   Total body pain 01/13/2019   Postmenopausal bleeding 10/25/2017   Chronic pain of both knees 05/03/2017   COPD (chronic obstructive pulmonary disease) (HCC) 03/20/2017   Tachycardia    Mixed incontinence 09/29/2015   Genetic testing 08/26/2015   Recurrent breast cancer, left (HCC) 04/14/2014   Disorder of bladder 09/29/2008   LOW BACK PAIN, CHRONIC 09/29/2008   Morbid obesity (HCC) 11/19/2007   Iron deficiency anemia 11/14/2007   Mixed  bipolar I disorder (HCC) 02/20/2007   Tobacco abuse 02/20/2007    has No Known Allergies.  MEDICAL HISTORY: Past Medical History:  Diagnosis Date   Acute cholecystitis 04/17/2019   Anemia    Anxiety    Arthritis    Bipolar 1 disorder (HCC)    Blood transfusion without reported diagnosis 2007   post bleeding childbirth   Cancer (HCC)    left breast cancer 2015   COPD (chronic obstructive pulmonary disease) (HCC)    pt reports  she is not on oxygen   COVID-19 virus infection 06/07/2019   Depression    Diabetes mellitus without complication (HCC)    gestational   Hand injury, right, initial encounter 03/19/2019   Heart murmur    as a child-not adult-no cardiac work up   History of radiation therapy 08/31/14-10/21/14   left breast/ left supraclavicular 50.4 Gy 28 fx, lef tposterior axillary boost 9.52 Gy 28 fx, left rbeast boost/ 10 Gy 5 fx   Personal history of chemotherapy    Personal history of radiation therapy    Rash and nonspecific skin eruption 12/27/2020    SURGICAL HISTORY: Past Surgical History:  Procedure Laterality Date   BREAST BIOPSY Left 04/01/14   BREAST BIOPSY Right 04/19/14   BREAST LUMPECTOMY WITH AXILLARY LYMPH NODE DISSECTION Bilateral 05/03/14   CHOLECYSTECTOMY N/A 04/18/2019   Procedure: LAPAROSCOPIC CHOLECYSTECTOMY WITH INTRAOPERATIVE CHOLANGIOGRAM;  Surgeon: Griselda Miner, MD;  Location: [facility];  Service: General;  Laterality: N/A;   DILATION AND CURETTAGE OF UTERUS     after childbirth   MASS EXCISION Right 07/25/2021   Procedure: RIGHT AXILLARY MASS EXCISION;  Surgeon: Emelia Loron, MD;  Location: [facility] OR;  Service: General;  Laterality: Right;   MINOR BREAST BIOPSY Left 07/25/2021   Procedure: SKIN PUNCH BIOPSY LEFT BREAST;  Surgeon: Emelia Loron, MD;  Location: [facility] OR;  Service: General;  Laterality: Left;   MULTIPLE TOOTH EXTRACTIONS     only 5 left   PORTACATH PLACEMENT Right 06/18/2014   Procedure: INSERTION PORT-A-CATH;  Surgeon: Emelia Loron, MD;  Location: [facility];  Service: General;  Laterality: Right;   TONSILLECTOMY AND ADENOIDECTOMY     TOTAL MASTECTOMY Left 08/28/2021   Procedure: LEFT TOTAL MASTECTOMY;  Surgeon: Emelia Loron, MD;  Location: [facility] OR;  Service: General;  Laterality: Left;  90 MINUTES- POST PER KELLY AND SHE WILL PUT IN ROOM    SOCIAL HISTORY: Social History   Socioeconomic History   Marital status: Widowed     Spouse name: Not on file   Number of children: 5   Years of education: 25   Highest education level: High school graduate  Occupational History   Occupation: disabled  Tobacco Use   Smoking status: Every Day    Current packs/day: 0.50    Average packs/day: 0.5 packs/day for 34.0 years (17.0 ttl pk-yrs)    Types: Cigarettes   Smokeless tobacco: Never  Vaping Use   Vaping status: Never Used  Substance and Sexual Activity   Alcohol use: No   Drug use: No   Sexual activity: Not Currently  Other Topics Concern   Not on file  Social History Narrative   Patient lives in [place] with 3 of her children.    One adult son lives "up the road" and one lives in [place].   Patient is on disability.    Patient enjoys spending time with her family and watching TV.    Patient walks around her home for  exercise.       Are you right handed or left handed? Right handed   Are you currently employed ? Disability    What is your current occupation?   Do you live at home alone? Lives with son   Who lives with you?    What type of home do you live in: 1 story or 2 story? Lives in a two story home       Social Determinants of Health   Financial Resource Strain: Low Risk  (06/21/2023)   Overall Financial Resource Strain (CARDIA)    Difficulty of Paying Living Expenses: Not very hard  Food Insecurity: Food Insecurity Present (06/21/2023)   Hunger Vital Sign    Worried About Running Out of Food in the Last Year: Sometimes true    Ran Out of Food in the Last Year: Never true  Transportation Needs: No Transportation Needs (06/21/2023)   PRAPARE - Administrator, Civil Service (Medical): No    Lack of Transportation (Non-Medical): No  Physical Activity: Inactive (06/21/2023)   Exercise Vital Sign    Days of Exercise per Week: 0 days    Minutes of Exercise per Session: 0 min  Stress: No Stress Concern Present (06/21/2023)   [employer] of Occupational Health - Occupational Stress  Questionnaire    Feeling of Stress : Not at all  Social Connections: Moderately Isolated (06/21/2023)   Social Connection and Isolation Panel [NHANES]    Frequency of Communication with Friends and Family: More than three times a week    Frequency of Social Gatherings with Friends and Family: Three times a week    Attends Religious Services: More than 4 times per year    Active Member of Clubs or Organizations: No    Attends Banker Meetings: Never    Marital Status: Widowed  Intimate Partner Violence: Not At Risk (06/21/2023)   Humiliation, Afraid, Rape, and Kick questionnaire    Fear of Current or Ex-Partner: No    Emotionally Abused: No    Physically Abused: No    Sexually Abused: No    FAMILY HISTORY: Family History  Problem Relation Age of Onset   Heart disease Father    Cancer Father        Prostate   Cancer Maternal Grandmother    Depression Maternal Grandfather    Cancer Paternal Grandmother    Stomach cancer Neg Hx    Esophageal cancer Neg Hx    Rectal cancer Neg Hx     Review of Systems  Constitutional:  Negative for appetite change, chills, fatigue, fever and unexpected weight change.  HENT:   Negative for hearing loss, lump/mass and trouble swallowing.   Eyes:  Negative for eye problems and icterus.  Respiratory:  Negative for chest tightness, cough and shortness of breath.   Cardiovascular:  Negative for chest pain, leg swelling and palpitations.  Gastrointestinal:  Negative for abdominal distention, abdominal pain, constipation, diarrhea, nausea and vomiting.  Endocrine: Negative for hot flashes.  Genitourinary:  Negative for difficulty urinating.   Musculoskeletal:  Positive for arthralgias (left thumb stiffness and discomfort).  Skin:  Negative for itching and rash.  Neurological:  Negative for dizziness, extremity weakness, headaches and numbness.  Hematological:  Negative for adenopathy. Does not bruise/bleed easily.  Psychiatric/Behavioral:   Negative for depression. The patient is not nervous/anxious.       PHYSICAL EXAMINATION    Vitals:   09/02/23 1431  BP: 130/65  Pulse: (!) 115  Resp: 18  Temp: 98.2 F (36.8 C)  SpO2: 99%    Physical Exam Constitutional:      General: She is not in acute distress.    Appearance: Normal appearance. She is not toxic-appearing.  HENT:     Head: Normocephalic and atraumatic.     Mouth/Throat:     Mouth: Mucous membranes are moist.     Pharynx: Oropharynx is clear. No oropharyngeal exudate or posterior oropharyngeal erythema.  Eyes:     General: No scleral icterus. Cardiovascular:     Rate and Rhythm: Normal rate and regular rhythm.     Pulses: Normal pulses.     Heart sounds: Normal heart sounds.  Pulmonary:     Effort: Pulmonary effort is normal.     Breath sounds: Normal breath sounds.  Chest:     Comments: Right breast benign, left breast s/p mastectomy and radiation, no sign of local recurrence Abdominal:     General: Abdomen is flat. Bowel sounds are normal. There is no distension.     Palpations: Abdomen is soft.     Tenderness: There is no abdominal tenderness.  Musculoskeletal:        General: No swelling.     Cervical back: Neck supple.  Lymphadenopathy:     Cervical: No cervical adenopathy.     Upper Body:     Right upper body: No axillary adenopathy.     Left upper body: No axillary adenopathy.  Skin:    General: Skin is warm and dry.     Findings: No rash.  Neurological:     General: No focal deficit present.     Mental Status: She is alert.  Psychiatric:        Mood and Affect: Mood normal.        Behavior: Behavior normal.       ASSESSMENT and THERAPY PLAN:   Recurrent breast cancer, left (HCC) Kimberly [name] is a 52 year old woman with recurrent left-sided breast cancer who is status post mastectomy and radiation.    Breast Cancer Patient is post-treatment and currently on Faslodex injections every four weeks. No signs of recurrence on  physical examination. Patient reports fear and anxiety related to mammogram and potential findings. -Continue Faslodex injections every four weeks. -Order labs to monitor response to treatment. -Encourage patient to schedule right breast mammogram as soon as possible for continued surveillance.  Hypothyroidism Patient reports being on Synthroid for thyroid dysfunction. -Continue current treatment.  Iron deficiency anemia -repeating iron studies today  General Health Maintenance / Followup Plans -Schedule follow-up appointment after the sixth Faslodex injection to assess tolerance and response to treatment.   All questions were answered. The patient knows to call the clinic with any problems, questions or concerns. We can certainly see the patient much sooner if necessary.  Total encounter time:20 minutes*in face-to-face visit time, chart review, lab review, care coordination, order entry, and documentation of the encounter time.    Lillard Anes, NP 09/02/23 3:02 PM Medical Oncology and Hematology [facility] [address] [place], [place] [address] Tel. [phone]    Fax. [phone]  *Total Encounter Time as defined by the Centers for Medicare and Medicaid Services includes, in addition to the face-to-face time of a patient visit (documented in the note above) non-face-to-face time: obtaining and reviewing outside history, ordering and reviewing medications, tests or procedures, care coordination (communications with other health care professionals or caregivers) and documentation in the medical record.

## 2023-09-02 NOTE — Assessment & Plan Note (Signed)
Kimberly [name] is a 52 year old woman with recurrent left-sided breast cancer who is status post mastectomy and radiation.    Breast Cancer Patient is post-treatment and currently on Faslodex injections every four weeks. No signs of recurrence on physical examination. Patient reports fear and anxiety related to mammogram and potential findings. -Continue Faslodex injections every four weeks. -Order labs to monitor response to treatment. -Encourage patient to schedule right breast mammogram as soon as possible for continued surveillance.  Hypothyroidism Patient reports being on Synthroid for thyroid dysfunction. -Continue current treatment.  Iron deficiency anemia -repeating iron studies today  General Health Maintenance / Followup Plans -Schedule follow-up appointment after the sixth Faslodex injection to assess tolerance and response to treatment.

## 2023-09-03 LAB — FERRITIN: Ferritin: 7 ng/mL — ABNORMAL LOW (ref 11–307)

## 2023-09-06 ENCOUNTER — Ambulatory Visit: Payer: Medicare HMO | Admitting: Family Medicine

## 2023-09-12 ENCOUNTER — Telehealth: Payer: Self-pay

## 2023-09-12 ENCOUNTER — Encounter: Payer: Medicare HMO | Admitting: Neurology

## 2023-09-12 ENCOUNTER — Other Ambulatory Visit: Payer: Self-pay | Admitting: Adult Health

## 2023-09-12 NOTE — Telephone Encounter (Signed)
-----   Message from Noreene Filbert sent at 09/12/2023  1:46 PM EST ----- Will you see if patient is going to come here and get iron, or get it elsewhere?  I don't want to duplicate what her PCP was doing.  Thanks LC ----- Message ----- From: Leory Plowman, Lab [facility] [place] Sent: 09/02/2023   3:09 PM EST To: Loa Socks, NP

## 2023-09-12 NOTE — Telephone Encounter (Signed)
Called pt per NP to confirm which facility will be ordering iron. She states she prefers to come to [facility]. Advised pt scheduling will be in touch to facilitate appts. She verbalized thanks and understanding.

## 2023-09-13 ENCOUNTER — Telehealth: Payer: Self-pay | Admitting: Adult Health

## 2023-09-13 NOTE — Telephone Encounter (Signed)
Patient is aware of scheduled appointment times/dates for scheduled iron infusions

## 2023-09-17 ENCOUNTER — Ambulatory Visit: Payer: Medicare HMO | Admitting: Gastroenterology

## 2023-09-17 ENCOUNTER — Encounter: Payer: Self-pay | Admitting: Gastroenterology

## 2023-09-17 VITALS — BP 130/80 | HR 108 | Ht 69.0 in | Wt 236.0 lb

## 2023-09-17 DIAGNOSIS — K641 Second degree hemorrhoids: Secondary | ICD-10-CM | POA: Diagnosis not present

## 2023-09-17 DIAGNOSIS — R933 Abnormal findings on diagnostic imaging of other parts of digestive tract: Secondary | ICD-10-CM | POA: Diagnosis not present

## 2023-09-17 DIAGNOSIS — D500 Iron deficiency anemia secondary to blood loss (chronic): Secondary | ICD-10-CM

## 2023-09-17 DIAGNOSIS — K5520 Angiodysplasia of colon without hemorrhage: Secondary | ICD-10-CM

## 2023-09-17 DIAGNOSIS — K31819 Angiodysplasia of stomach and duodenum without bleeding: Secondary | ICD-10-CM | POA: Diagnosis not present

## 2023-09-17 DIAGNOSIS — K648 Other hemorrhoids: Secondary | ICD-10-CM

## 2023-09-17 NOTE — Progress Notes (Signed)
[facility] Progress Note  Chief Complaint: Iron deficiency anemia and symptomatic internal hemorrhoids  Subjective  History: Iron deficiency anemia followed by hematology.  Patient had undergone EGD and colonoscopy in October 2022 for iron deficiency anemia, at colonoscopy she had grade 3 internal hemorrhoids, external hemorrhoids and otherwise unremarkable colon. At EGD 2 cm hiatal hernia and mild patchy gastritis. Path showed no H. pylori, mild chronic gastritis  She had an iron infusion in August and says she has had a total of 6 iron infusions over the past 2 years, she has not required any transfusions.  (As of today-09/17/2023) She is not on any blood thinners, no regular aspirin or NSAIDs, does take Protonix daily Frequently bleeding internal hemorrhoids suspected to be the source of IDA. Small bowel video capsule study: Single small bowel AVM beyond the reach of enteroscope.  There was also some nonspecifically abnormal appearing duodenal mucosa, it look like swelling on the tissue fold, did not appear ulcerated. Donalyn continues to have rectal bleeding with every bowel movement.  She often feels a pressure or heaviness and some prolapse in that area.  She says sometimes the area becomes much more swollen and painful.  She is not sitting for prolonged periods or straining for bowel movements.  She has been quite anxious about this anemia, tired of getting IV iron treatments, and has been very concerned about undergoing a banding today.  ROS: Cardiovascular:  no chest pain Respiratory: no dyspnea Anxiety Remainder systems negative except as above The patient's Past Medical, Family and Social History were reviewed and are on file in the EMR. Past Medical History:  Diagnosis Date   Acute cholecystitis 04/17/2019   Anemia    Anxiety    Arthritis    Bipolar 1 disorder (HCC)    Blood transfusion without reported diagnosis 2007   post bleeding childbirth   Cancer (HCC)     left breast cancer 2015   COPD (chronic obstructive pulmonary disease) (HCC)    pt reports she is not on oxygen   COVID-19 virus infection 06/07/2019   Depression    Diabetes mellitus without complication (HCC)    gestational   Hand injury, right, initial encounter 03/19/2019   Heart murmur    as a child-not adult-no cardiac work up   History of radiation therapy 08/31/14-10/21/14   left breast/ left supraclavicular 50.4 Gy 28 fx, lef tposterior axillary boost 9.52 Gy 28 fx, left rbeast boost/ 10 Gy 5 fx   Personal history of chemotherapy    Personal history of radiation therapy    Rash and nonspecific skin eruption 12/27/2020    Past Surgical History:  Procedure Laterality Date   BREAST BIOPSY Left 04/01/14   BREAST BIOPSY Right 04/19/14   BREAST LUMPECTOMY WITH AXILLARY LYMPH NODE DISSECTION Bilateral 05/03/14   CHOLECYSTECTOMY N/A 04/18/2019   Procedure: LAPAROSCOPIC CHOLECYSTECTOMY WITH INTRAOPERATIVE CHOLANGIOGRAM;  Surgeon: Griselda Miner, MD;  Location: [facility];  Service: General;  Laterality: N/A;   DILATION AND CURETTAGE OF UTERUS     after childbirth   MASS EXCISION Right 07/25/2021   Procedure: RIGHT AXILLARY MASS EXCISION;  Surgeon: Emelia Loron, MD;  Location: [facility] OR;  Service: General;  Laterality: Right;   MINOR BREAST BIOPSY Left 07/25/2021   Procedure: SKIN PUNCH BIOPSY LEFT BREAST;  Surgeon: Emelia Loron, MD;  Location: [facility] OR;  Service: General;  Laterality: Left;   MULTIPLE TOOTH EXTRACTIONS     only 5 left   PORTACATH PLACEMENT Right 06/18/2014  Procedure: INSERTION PORT-A-CATH;  Surgeon: Emelia Loron, MD;  Location: [facility];  Service: General;  Laterality: Right;   TONSILLECTOMY AND ADENOIDECTOMY     TOTAL MASTECTOMY Left 08/28/2021   Procedure: LEFT TOTAL MASTECTOMY;  Surgeon: Emelia Loron, MD;  Location: [facility] OR;  Service: General;  Laterality: Left;  90 MINUTES- POST PER KELLY AND SHE WILL PUT IN ROOM     Objective:  Med list reviewed  Current Outpatient Medications:    albuterol (VENTOLIN HFA) 108 (90 Base) MCG/ACT inhaler, Inhale 2 puffs into the lungs every 6 (six) hours as needed for wheezing or shortness of breath., Disp: 8 g, Rfl: 2   busPIRone (BUSPAR) 15 MG tablet, Take 15 mg by mouth 3 (three) times daily., Disp: , Rfl:    doxepin (SINEQUAN) 75 MG capsule, Take 75 mg by mouth at bedtime., Disp: , Rfl:    ferrous sulfate 324 MG TBEC, Take 1 tablet (324 mg total) by mouth daily., Disp: 90 tablet, Rfl: 3   Fulvestrant (FASLODEX IM), Inject 2 Doses into the muscle every 14 (fourteen) days. Every other Tuesday, Disp: , Rfl:    montelukast (SINGULAIR) 10 MG tablet, Take 1 tablet (10 mg total) by mouth at bedtime. (Patient taking differently: Take 10 mg by mouth daily as needed (allergies).), Disp: 30 tablet, Rfl: 3   prazosin (MINIPRESS) 1 MG capsule, Take 1 mg by mouth at bedtime., Disp: , Rfl:    pregabalin (LYRICA) 25 MG capsule, Take 1 capsule (25 mg total) by mouth at bedtime., Disp: 30 capsule, Rfl: 0   prochlorperazine (COMPAZINE) 5 MG tablet, TAKE 1 TABLET(5 MG) BY MOUTH EVERY 6 HOURS AS NEEDED FOR VOMITING OR NAUSEA, Disp: 30 tablet, Rfl: 1   QUEtiapine (SEROQUEL) 400 MG tablet, Take 400 mg by mouth at bedtime. , Disp: , Rfl:    rosuvastatin (CRESTOR) 10 MG tablet, Take 1 tablet (10 mg total) by mouth daily., Disp: 90 tablet, Rfl: 1   Tiotropium Bromide Monohydrate (SPIRIVA RESPIMAT) 1.25 MCG/ACT AERS, Inhale 2 puffs into the lungs daily., Disp: 4 each, Rfl: 0   dicyclomine (BENTYL) 10 MG capsule, Take 1 capsule (10 mg total) by mouth 3 (three) times daily. For diarrhea and cramping (Patient not taking: Reported on 09/17/2023), Disp: 90 capsule, Rfl: 6   hydrocortisone (ANUSOL-HC) 25 MG suppository, Use every other night for 2-weeks until prescription is complete (Patient not taking: Reported on 09/17/2023), Disp: 7 suppository, Rfl: 0   levothyroxine (SYNTHROID) 75 MCG tablet,  Take 1 tablet (75 mcg total) by mouth every morning. 30 minutes before food, Disp: 30 tablet, Rfl: 1   lidocaine-prilocaine (EMLA) cream, Apply 1 application topically as needed. (Patient taking differently: Apply 1 application  topically daily as needed (port access).), Disp: 30 g, Rfl: 0   Vital signs in last 24 hrs: Vitals:   09/17/23 0816  BP: 130/80  Pulse: (!) 108   Wt Readings from Last 3 Encounters:  09/17/23 236 lb (107 kg)  09/02/23 260 lb 14.4 oz (118.3 kg)  08/23/23 264 lb 6.4 oz (119.9 kg)    Physical Exam Ines Bloomer, CMA present for entire exam and procedures Well-appearing.,  Albeit anxious HEENT: sclera anicteric, oral mucosa moist without lesions Neck: supple, no thyromegaly, JVD or lymphadenopathy Cardiac: Regular without appreciable murmur,  no peripheral edema Pulm: clear to auscultation bilaterally, normal RR and effort noted Abdomen: soft, no tenderness, with active bowel sounds. No guarding or palpable hepatosplenomegaly.  (Limited by body habitus) Skin; warm and dry, no  jaundice or rash Grade 2 internal hemorrhoids seen.  Sensitive to DRE, but no fissure or palpable internal lesion felt. Anoscopy revealed swollen internal hemorrhoidal plexus, RP most prominent Labs:     Latest Ref Rng & Units 09/02/2023    2:57 PM 08/07/2023    2:17 PM 07/18/2023    5:02 PM  CBC  WBC 4.0 - 10.5 K/uL 7.1  7.0  6.1   Hemoglobin 12.0 - 15.0 g/dL 16.1  09.6  04.5   Hematocrit 36.0 - 46.0 % 34.7  35.3  35.4   Platelets 150 - 400 K/uL 279  262  223     ___________________________________________ Radiologic studies:   ____________________________________________ Other:   _____________________________________________ Assessment & Plan  Assessment: Encounter Diagnoses  Name Primary?   Iron deficiency anemia due to chronic blood loss Yes   Bleeding internal hemorrhoids    AVM (arteriovenous malformation) of small bowel, acquired    Abnormal finding on GI tract  imaging    Her iron deficiency anemia appears to be from longstanding internal hemorrhoidal bleeding, which occurs every day at this point.  She is dependent on IV iron to maintain a hemoglobin of 11, and this problem needs a more definitive solution. She also had a nonspecific duodenal mucosal abnormality on video capsule study that needs endoscopic evaluation.  We discussed all that along with diagrams of the anatomy and she was agreeable to an upper endoscopy.  Risk and benefits were discussed as well.  The benefits and risks of the planned procedure were described in detail with the patient or (when appropriate) their health care proxy.  Risks were outlined as including, but not limited to, bleeding, infection, perforation, adverse medication reaction leading to cardiac or pulmonary decompensation, pancreatitis (if ERCP).  The limitation of incomplete mucosal visualization was also discussed.  No guarantees or warranties were given.   I offered her hemorrhoidal banding to hopefully eradicate her internal hemorrhoids and thus stopped the bleeding and improve her anemia.  Diagrams of the anatomy were shown, procedure described in detail, all questions were answered and she agreed to proceed.  She also understands this will take at least 3 and possibly 4 sessions for most patients.  PROCEDURE NOTE: The patient presents with symptomatic grade 2  hemorrhoids, requesting rubber band ligation of his/her hemorrhoidal disease.  All risks, benefits and alternative forms of therapy were described and informed consent was obtained.    The anorectum was pre-medicated with 0.125% NTG and lubricant. The decision was made to band the RP internal hemorrhoids, and the [facility] was used to perform band ligation without complication.  Digital anorectal examination was then performed to assure proper positioning of the band, and to adjust the banded tissue as required.  The patient was discharged home  without pain or other issues.  Dietary and behavioral recommendations were given and along with follow-up instructions.     The following adjunctive treatments were recommended:  None  The patient will return 3 to 4 weeks for  follow-up and possible additional banding as required. No complications were encountered and the patient tolerated the procedure well.    30 minutes were spent on this encounter (including chart review, history/exam, counseling/coordination of care, and documentation) > 50% of that time was spent on counseling and coordination of care.   Kimberly [name]

## 2023-09-17 NOTE — Patient Instructions (Addendum)
HEMORRHOID BANDING PROCEDURE    FOLLOW-UP CARE   The procedure you have had should have been relatively painless since the banding of the area involved does not have nerve endings and there is no pain sensation.  The rubber band cuts off the blood supply to the hemorrhoid and the band may fall off as soon as 48 hours after the banding (the band may occasionally be seen in the toilet bowl following a bowel movement). You may notice a temporary feeling of fullness in the rectum which should respond adequately to plain Tylenol or Motrin.  Following the banding, avoid strenuous exercise that evening and resume full activity the next day.  A sitz bath (soaking in a warm tub) or bidet is soothing, and can be useful for cleansing the area after bowel movements.     To avoid constipation, take two tablespoons of natural wheat bran, natural oat bran, flax, Benefiber or any over the counter fiber supplement and increase your water intake to 7-8 glasses daily.    Unless you have been prescribed anorectal medication, do not put anything inside your rectum for two weeks: No suppositories, enemas, fingers, etc.  Occasionally, you may have more bleeding than usual after the banding procedure.  This is often from the untreated hemorrhoids rather than the treated one.  Don't be concerned if there is a tablespoon or so of blood.  If there is more blood than this, lie flat with your bottom higher than your head and apply an ice pack to the area. If the bleeding does not stop within a half an hour or if you feel faint, call our office at [phone] or go to the emergency room.  Problems are not common; however, if there is a substantial amount of bleeding, severe pain, chills, fever or difficulty passing urine (very rare) or other problems, you should call us at [phone] or report to the nearest emergency room.  Do not stay seated continuously for more than 2-3 hours for a day or two after the procedure.   Tighten your buttock muscles 10-15 times every two hours and take 10-15 deep breaths every 1-2 hours.  Do not spend more than a few minutes on the toilet if you cannot empty your bowel; instead re-visit the toilet at a later time.      You have been scheduled for an endoscopy. Please follow written instructions given to you at your visit today.  If you use inhalers (even only as needed), please bring them with you on the day of your procedure.  If you take any of the following medications, they will need to be adjusted prior to your procedure:   DO NOT TAKE 7 DAYS PRIOR TO TEST- Trulicity (dulaglutide) Ozempic, Wegovy (semaglutide) Mounjaro (tirzepatide) Bydureon Bcise (exanatide extended release)  DO NOT TAKE 1 DAY PRIOR TO YOUR TEST Rybelsus (semaglutide) Adlyxin (lixisenatide) Victoza (liraglutide) Byetta (exanatide) ___________________________________________________________________________    _______________________________________________________  If your blood pressure at your visit was 140/90 or greater, please contact your primary care physician to follow up on this.  _______________________________________________________  If you are age 31 or older, your body mass index should be between 23-30. Your Body mass index is 34.85 kg/m. If this is out of the aforementioned range listed, please consider follow up with your Primary Care Provider.  If you are age 31 or younger, your body mass index should be between 19-25. Your Body mass index is 34.85 kg/m. If this is out of the aformentioned range  listed, please consider follow up with your Primary Care Provider.   ________________________________________________________  The [facility] providers would like to encourage you to use [facility] to communicate with providers for non-urgent requests or questions.  Due to long hold times on the telephone, sending your provider a message by [facility] may be a faster and more  efficient way to get a response.  Please allow 48 business hours for a response.  Please remember that this is for non-urgent requests.  _______________________________________________________ It was a pleasure to see you today!  Thank you for trusting me with your gastrointestinal care!

## 2023-09-27 ENCOUNTER — Inpatient Hospital Stay: Payer: Medicare HMO

## 2023-09-28 ENCOUNTER — Inpatient Hospital Stay: Payer: Medicare HMO | Attending: Oncology

## 2023-09-28 VITALS — BP 138/92 | HR 101 | Temp 98.0°F | Resp 18

## 2023-09-28 DIAGNOSIS — D509 Iron deficiency anemia, unspecified: Secondary | ICD-10-CM | POA: Diagnosis not present

## 2023-09-28 DIAGNOSIS — C50412 Malignant neoplasm of upper-outer quadrant of left female breast: Secondary | ICD-10-CM | POA: Diagnosis not present

## 2023-09-28 DIAGNOSIS — Z5111 Encounter for antineoplastic chemotherapy: Secondary | ICD-10-CM | POA: Diagnosis not present

## 2023-09-28 MED ORDER — HEPARIN SOD (PORK) LOCK FLUSH 100 UNIT/ML IV SOLN
500.0000 [IU] | Freq: Once | INTRAVENOUS | Status: AC | PRN
  Administered 2023-09-28: 500 [IU]

## 2023-09-28 MED ORDER — IRON SUCROSE 20 MG/ML IV SOLN
200.0000 mg | Freq: Once | INTRAVENOUS | Status: AC
Start: 2023-09-28 — End: 2023-09-28
  Administered 2023-09-28: 200 mg via INTRAVENOUS
  Filled 2023-09-28: qty 10

## 2023-09-28 MED ORDER — SODIUM CHLORIDE 0.9% FLUSH
10.0000 mL | Freq: Once | INTRAVENOUS | Status: AC | PRN
  Administered 2023-09-28: 10 mL

## 2023-10-01 ENCOUNTER — Ambulatory Visit: Payer: Medicare HMO

## 2023-10-01 NOTE — Progress Notes (Unsigned)
    SUBJECTIVE:   CHIEF COMPLAINT / HPI:   Wrist concern ***  PERTINENT  PMH / PSH: ***  OBJECTIVE:   LMP 07/13/2021 (Approximate)   ***  ASSESSMENT/PLAN:   Assessment & Plan    Vonna Drafts, MD [facility]

## 2023-10-04 ENCOUNTER — Inpatient Hospital Stay: Payer: Medicare HMO

## 2023-10-04 VITALS — BP 150/96 | HR 94 | Temp 98.0°F | Resp 11

## 2023-10-04 DIAGNOSIS — C50412 Malignant neoplasm of upper-outer quadrant of left female breast: Secondary | ICD-10-CM | POA: Diagnosis not present

## 2023-10-04 DIAGNOSIS — D509 Iron deficiency anemia, unspecified: Secondary | ICD-10-CM | POA: Diagnosis not present

## 2023-10-04 DIAGNOSIS — Z5111 Encounter for antineoplastic chemotherapy: Secondary | ICD-10-CM | POA: Diagnosis not present

## 2023-10-04 MED ORDER — SODIUM CHLORIDE 0.9% FLUSH
10.0000 mL | Freq: Once | INTRAVENOUS | Status: AC | PRN
  Administered 2023-10-04: 10 mL

## 2023-10-04 MED ORDER — IRON SUCROSE 20 MG/ML IV SOLN
200.0000 mg | Freq: Once | INTRAVENOUS | Status: AC
  Administered 2023-10-04: 200 mg via INTRAVENOUS
  Filled 2023-10-04: qty 10

## 2023-10-04 MED ORDER — SODIUM CHLORIDE 0.9 % IV SOLN: INTRAVENOUS | Status: DC

## 2023-10-04 MED ORDER — FULVESTRANT 250 MG/5ML IM SOSY
500.0000 mg | PREFILLED_SYRINGE | Freq: Once | INTRAMUSCULAR | Status: AC
  Administered 2023-10-04: 500 mg via INTRAMUSCULAR
  Filled 2023-10-04: qty 10

## 2023-10-04 MED ORDER — HEPARIN SOD (PORK) LOCK FLUSH 100 UNIT/ML IV SOLN
250.0000 [IU] | Freq: Once | INTRAVENOUS | Status: AC | PRN
  Administered 2023-10-04: 500 [IU]

## 2023-10-04 NOTE — Progress Notes (Signed)
Patient declined post iron injection observation.  Tolerated treatment well without incident.  Ambulated to lobby.

## 2023-10-04 NOTE — Patient Instructions (Signed)
Iron Sucrose Injection What is this medication? IRON SUCROSE (EYE ern SOO krose) treats low levels of iron (iron deficiency anemia) in people with kidney disease. Iron is a mineral that plays an important role in making red blood cells, which carry oxygen from your lungs to the rest of your body. This medicine may be used for other purposes; ask your health care provider or pharmacist if you have questions. COMMON BRAND NAME(S): Venofer What should I tell my care team before I take this medication? They need to know if you have any of these conditions: Anemia not caused by low iron levels Heart disease High levels of iron in the blood Kidney disease Liver disease An unusual or allergic reaction to iron, other medications, foods, dyes, or preservatives Pregnant or trying to get pregnant Breastfeeding How should I use this medication? This medication is for infusion into a vein. It is given in a hospital or clinic setting. Talk to your care team about the use of this medication in children. While this medication may be prescribed for children as young as 2 years for selected conditions, precautions do apply. Overdosage: If you think you have taken too much of this medicine contact a poison control center or emergency room at once. NOTE: This medicine is only for you. Do not share this medicine with others. What if I miss a dose? Keep appointments for follow-up doses. It is important not to miss your dose. Call your care team if you are unable to keep an appointment. What may interact with this medication? Do not take this medication with any of the following: Deferoxamine Dimercaprol Other iron products This medication may also interact with the following: Chloramphenicol Deferasirox This list may not describe all possible interactions. Give your health care provider a list of all the medicines, herbs, non-prescription drugs, or dietary supplements you use. Also tell them if you smoke,  drink alcohol, or use illegal drugs. Some items may interact with your medicine. What should I watch for while using this medication? Visit your care team regularly. Tell your care team if your symptoms do not start to get better or if they get worse. You may need blood work done while you are taking this medication. You may need to follow a special diet. Talk to your care team. Foods that contain iron include: whole grains/cereals, dried fruits, beans, or peas, leafy green vegetables, and organ meats (liver, kidney). What side effects may I notice from receiving this medication? Side effects that you should report to your care team as soon as possible: Allergic reactions--skin rash, itching, hives, swelling of the face, lips, tongue, or throat Low blood pressure--dizziness, feeling faint or lightheaded, blurry vision Shortness of breath Side effects that usually do not require medical attention (report to your care team if they continue or are bothersome): Flushing Headache Joint pain Muscle pain Nausea Pain, redness, or irritation at injection site This list may not describe all possible side effects. Call your doctor for medical advice about side effects. You may report side effects to FDA at 1-800-FDA-1088. Where should I keep my medication? This medication is given in a hospital or clinic. It will not be stored at home. NOTE: This sheet is a summary. It may not cover all possible information. If you have questions about this medicine, talk to your doctor, pharmacist, or health care provider.  2024 Elsevier/Gold Standard (2023-03-15 00:00:00)  Fulvestrant Injection What is this medication? FULVESTRANT (ful VES trant) treats breast cancer. It works by blocking  the hormone estrogen in breast tissue, which prevents breast cancer cells from spreading or growing. This medicine may be used for other purposes; ask your health care provider or pharmacist if you have questions. COMMON BRAND  NAME(S): FASLODEX What should I tell my care team before I take this medication? They need to know if you have any of these conditions: Bleeding disorder Liver disease Low blood cell levels, such as low white cells, red cells, and platelets An unusual or allergic reaction to fulvestrant, other medications, foods, dyes, or preservatives Pregnant or trying to get pregnant Breast-feeding How should I use this medication? This medication is injected into a muscle. It is given by your care team in a hospital or clinic setting. Talk to your care team about the use of this medication in children. Special care may be needed. Overdosage: If you think you have taken too much of this medicine contact a poison control center or emergency room at once. NOTE: This medicine is only for you. Do not share this medicine with others. What if I miss a dose? Keep appointments for follow-up doses. It is important not to miss your dose. Call your care team if you are unable to keep an appointment. What may interact with this medication? Certain medications that prevent or treat blood clots, such as warfarin, enoxaparin, dalteparin, apixaban, dabigatran, rivaroxaban This list may not describe all possible interactions. Give your health care provider a list of all the medicines, herbs, non-prescription drugs, or dietary supplements you use. Also tell them if you smoke, drink alcohol, or use illegal drugs. Some items may interact with your medicine. What should I watch for while using this medication? Your condition will be monitored carefully while you are receiving this medication. You may need blood work while taking this medication. Talk to your care team if you may be pregnant. Serious birth defects can occur if you take this medication during pregnancy and for 1 year after the last dose. You will need a negative pregnancy test before starting this medication. Contraception is recommended while taking this  medication and for 1 year after the last dose. Your care team can help you find the option that works for you. Do not breastfeed while taking this medication and for 1 year after the last dose. This medication may cause infertility. Talk to your care team if you are concerned about your fertility. What side effects may I notice from receiving this medication? Side effects that you should report to your care team as soon as possible: Allergic reactions or angioedema--skin rash, itching or hives, swelling of the face, eyes, lips, tongue, arms, or legs, trouble swallowing or breathing Pain, tingling, or numbness in the hands or feet Side effects that usually do not require medical attention (report to your care team if they continue or are bothersome): Bone, joint, or muscle pain Constipation Headache Hot flashes Nausea Pain, redness, or irritation at injection site Unusual weakness or fatigue This list may not describe all possible side effects. Call your doctor for medical advice about side effects. You may report side effects to FDA at 1-800-FDA-1088. Where should I keep my medication? This medication is given in a hospital or clinic. It will not be stored at home. NOTE: This sheet is a summary. It may not cover all possible information. If you have questions about this medicine, talk to your doctor, pharmacist, or health care provider.  2024 Elsevier/Gold Standard (2022-02-20 00:00:00)

## 2023-10-07 ENCOUNTER — Telehealth: Payer: Medicare HMO | Admitting: Family Medicine

## 2023-10-07 DIAGNOSIS — B9689 Other specified bacterial agents as the cause of diseases classified elsewhere: Secondary | ICD-10-CM

## 2023-10-07 DIAGNOSIS — J019 Acute sinusitis, unspecified: Secondary | ICD-10-CM | POA: Diagnosis not present

## 2023-10-07 DIAGNOSIS — J209 Acute bronchitis, unspecified: Secondary | ICD-10-CM

## 2023-10-07 MED ORDER — DOXYCYCLINE HYCLATE 100 MG PO TABS: 100.0000 mg | ORAL_TABLET | Freq: Two times a day (BID) | ORAL | 0 refills | Status: AC

## 2023-10-07 NOTE — Patient Instructions (Addendum)
Kimberly [name], thank you for joining Freddy Finner, NP for today's virtual visit.  While this provider is not your primary care provider (PCP), if your PCP is located in our provider database this encounter information will be shared with them immediately following your visit.   A [facility] MyChart account gives you access to today's visit and all your visits, tests, and labs performed at [facility] " click here if you don't have a [facility] MyChart account or go to [email or web address]  Consent: (Patient) Kimberly [name] provided verbal consent for this virtual visit at the beginning of the encounter.  Current Medications:  Current Outpatient Medications:    doxycycline (VIBRA-TABS) 100 MG tablet, Take 1 tablet (100 mg total) by mouth 2 (two) times daily for 10 days., Disp: 20 tablet, Rfl: 0   albuterol (VENTOLIN HFA) 108 (90 Base) MCG/ACT inhaler, Inhale 2 puffs into the lungs every 6 (six) hours as needed for wheezing or shortness of breath., Disp: 8 g, Rfl: 2   busPIRone (BUSPAR) 15 MG tablet, Take 15 mg by mouth 3 (three) times daily., Disp: , Rfl:    dicyclomine (BENTYL) 10 MG capsule, Take 1 capsule (10 mg total) by mouth 3 (three) times daily. For diarrhea and cramping (Patient not taking: Reported on 09/17/2023), Disp: 90 capsule, Rfl: 6   doxepin (SINEQUAN) 75 MG capsule, Take 75 mg by mouth at bedtime., Disp: , Rfl:    ferrous sulfate 324 MG TBEC, Take 1 tablet (324 mg total) by mouth daily., Disp: 90 tablet, Rfl: 3   Fulvestrant (FASLODEX IM), Inject 2 Doses into the muscle every 14 (fourteen) days. Every other Tuesday, Disp: , Rfl:    hydrocortisone (ANUSOL-HC) 25 MG suppository, Use every other night for 2-weeks until prescription is complete (Patient not taking: Reported on 09/17/2023), Disp: 7 suppository, Rfl: 0   levothyroxine (SYNTHROID) 75 MCG tablet, Take 1 tablet (75 mcg total) by mouth every morning. 30 minutes before food, Disp: 30 tablet, Rfl:  1   lidocaine-prilocaine (EMLA) cream, Apply 1 application topically as needed. (Patient taking differently: Apply 1 application  topically daily as needed (port access).), Disp: 30 g, Rfl: 0   montelukast (SINGULAIR) 10 MG tablet, Take 1 tablet (10 mg total) by mouth at bedtime. (Patient taking differently: Take 10 mg by mouth daily as needed (allergies).), Disp: 30 tablet, Rfl: 3   prazosin (MINIPRESS) 1 MG capsule, Take 1 mg by mouth at bedtime., Disp: , Rfl:    pregabalin (LYRICA) 25 MG capsule, Take 1 capsule (25 mg total) by mouth at bedtime., Disp: 30 capsule, Rfl: 0   prochlorperazine (COMPAZINE) 5 MG tablet, TAKE 1 TABLET(5 MG) BY MOUTH EVERY 6 HOURS AS NEEDED FOR VOMITING OR NAUSEA, Disp: 30 tablet, Rfl: 1   QUEtiapine (SEROQUEL) 400 MG tablet, Take 400 mg by mouth at bedtime. , Disp: , Rfl:    rosuvastatin (CRESTOR) 10 MG tablet, Take 1 tablet (10 mg total) by mouth daily., Disp: 90 tablet, Rfl: 1   Tiotropium Bromide Monohydrate (SPIRIVA RESPIMAT) 1.25 MCG/ACT AERS, Inhale 2 puffs into the lungs daily., Disp: 4 each, Rfl: 0   Medications ordered in this encounter:  Meds ordered this encounter  Medications   doxycycline (VIBRA-TABS) 100 MG tablet    Sig: Take 1 tablet (100 mg total) by mouth 2 (two) times daily for 10 days.    Dispense:  20 tablet    Refill:  0    Supervising Provider:   Merrilee Jansky [phone]     *  If you need refills on other medications prior to your next appointment, please contact your pharmacy*  Follow-Up: Call back or seek an in-person evaluation if the symptoms worsen or if the condition fails to improve as anticipated.  [facility] [phone]  Other Instructions   - Take meds as prescribed - Rest voice - Use a cool mist humidifier especially during the winter months when heat dries out the air. - Use saline nose sprays frequently to help soothe nasal passages if they are drying out. - Stay hydrated by drinking plenty of  fluids - Keep thermostat turn down low to prevent drying out which can cause a dry cough.  - For fever or aches or pains- take tylenol or ibuprofen as directed on bottle             * for fevers greater than 101 orally you may alternate ibuprofen and tylenol every 3 hours.  If you do not improve you will need a follow up visit in person.              If you have been instructed to have an in-person evaluation today at a local Urgent Care facility, please use the link below. It will take you to a list of all of our available [facility], including address, phone number and hours of operation. Please do not delay care.  [facility]  If you or a family member do not have a primary care provider, use the link below to schedule a visit and establish care. When you choose a [facility] primary care physician or advanced practice provider, you gain a long-term partner in health. Find a Primary Care Provider  Learn more about [facility] in-office and virtual care options: [facility] - Get Care Now

## 2023-10-07 NOTE — Progress Notes (Signed)
Virtual Visit Consent   Kimberly [name], you are scheduled for a virtual visit with a [facility] provider today. Just as with appointments in the office, your consent must be obtained to participate. Your consent will be active for this visit and any virtual visit you may have with one of our providers in the next 365 days. If you have a MyChart account, a copy of this consent can be sent to you electronically.  As this is a virtual visit, video technology does not allow for your provider to perform a traditional examination. This may limit your provider's ability to fully assess your condition. If your provider identifies any concerns that need to be evaluated in person or the need to arrange testing (such as labs, EKG, etc.), we will make arrangements to do so. Although advances in technology are sophisticated, we cannot ensure that it will always work on either your end or our end. If the connection with a video visit is poor, the visit may have to be switched to a telephone visit. With either a video or telephone visit, we are not always able to ensure that we have a secure connection.  By engaging in this virtual visit, you consent to the provision of healthcare and authorize for your insurance to be billed (if applicable) for the services provided during this visit. Depending on your insurance coverage, you may receive a charge related to this service.  I need to obtain your verbal consent now. Are you willing to proceed with your visit today? Kimberly [name] has provided verbal consent on 10/07/2023 for a virtual visit (video or telephone). Freddy Finner, NP  Date: 10/07/2023 10:32 AM  Virtual Visit via Video Note   I, Freddy Finner, connected with  Kimberly [name]  ([id number], 13-Apr-1971) on 10/07/23 at 10:30 AM EST by a video-enabled telemedicine application and verified that I am speaking with the correct person using two identifiers.  Location: Patient: Virtual Visit Location  Patient: Home Provider: Virtual Visit Location Provider: Home Office   I discussed the limitations of evaluation and management by telemedicine and the availability of in person appointments. The patient expressed understanding and agreed to proceed.    History of Present Illness: Kimberly [name] is a 52 y.o. who identifies as a female who was assigned female at birth, and is being seen today for sinus   Onset was Last week on Wednesday with tickle in throat and nasal congestion, sore throat Associated symptoms are sinus pressure, runny nose, cough -has green and yellow mucus, temp was 101- Modifying factors are ny quil, vapor cool, Tylenol and ibuprofen, theraa flu  Denies chest pain, shortness of breath  Exposure to sick contacts- known- colds in family members   She gets her Chemotherapy on Friday once a month. And iron infusions weekly for 5 weeks.   Noted to have bronchitis In Jan, which was treated but she developed COVID and PNA and had to have more ABX in Feb.   Problems:  Patient Active Problem List   Diagnosis Date Noted   Hypothyroidism 08/23/2023   Pain in both hands 08/23/2023   Peripheral neuropathy 04/26/2023   Upper respiratory infection 11/28/2022   Paronychia of great toe of left foot 09/01/2022   Hypokalemia 06/28/2022   Acute respiratory failure with hypoxia (HCC)    Bipolar depression (HCC)    Chronic pain of right knee 03/08/2022   S/P mastectomy, left 08/28/2021   Goals of care, counseling/discussion 07/18/2021   Microcytic anemia 07/05/2021  History of COVID-19 01/27/2021   Elevated LDL cholesterol level 12/30/2020   COPD exacerbation (HCC) 08/24/2020   Oligouria 03/10/2020   History of breast cancer 02/11/2020   Total body pain 01/13/2019   Postmenopausal bleeding 10/25/2017   Chronic pain of both knees 05/03/2017   COPD (chronic obstructive pulmonary disease) (HCC) 03/20/2017   Tachycardia    Mixed incontinence 09/29/2015   Genetic testing  08/26/2015   Recurrent breast cancer, left (HCC) 04/14/2014   Disorder of bladder 09/29/2008   LOW BACK PAIN, CHRONIC 09/29/2008   Morbid obesity (HCC) 11/19/2007   Iron deficiency anemia 11/14/2007   Mixed bipolar I disorder (HCC) 02/20/2007   Tobacco abuse 02/20/2007    Allergies: No Known Allergies Medications:  Current Outpatient Medications:    albuterol (VENTOLIN HFA) 108 (90 Base) MCG/ACT inhaler, Inhale 2 puffs into the lungs every 6 (six) hours as needed for wheezing or shortness of breath., Disp: 8 g, Rfl: 2   busPIRone (BUSPAR) 15 MG tablet, Take 15 mg by mouth 3 (three) times daily., Disp: , Rfl:    dicyclomine (BENTYL) 10 MG capsule, Take 1 capsule (10 mg total) by mouth 3 (three) times daily. For diarrhea and cramping (Patient not taking: Reported on 09/17/2023), Disp: 90 capsule, Rfl: 6   doxepin (SINEQUAN) 75 MG capsule, Take 75 mg by mouth at bedtime., Disp: , Rfl:    ferrous sulfate 324 MG TBEC, Take 1 tablet (324 mg total) by mouth daily., Disp: 90 tablet, Rfl: 3   Fulvestrant (FASLODEX IM), Inject 2 Doses into the muscle every 14 (fourteen) days. Every other Tuesday, Disp: , Rfl:    hydrocortisone (ANUSOL-HC) 25 MG suppository, Use every other night for 2-weeks until prescription is complete (Patient not taking: Reported on 09/17/2023), Disp: 7 suppository, Rfl: 0   levothyroxine (SYNTHROID) 75 MCG tablet, Take 1 tablet (75 mcg total) by mouth every morning. 30 minutes before food, Disp: 30 tablet, Rfl: 1   lidocaine-prilocaine (EMLA) cream, Apply 1 application topically as needed. (Patient taking differently: Apply 1 application  topically daily as needed (port access).), Disp: 30 g, Rfl: 0   montelukast (SINGULAIR) 10 MG tablet, Take 1 tablet (10 mg total) by mouth at bedtime. (Patient taking differently: Take 10 mg by mouth daily as needed (allergies).), Disp: 30 tablet, Rfl: 3   prazosin (MINIPRESS) 1 MG capsule, Take 1 mg by mouth at bedtime., Disp: , Rfl:     pregabalin (LYRICA) 25 MG capsule, Take 1 capsule (25 mg total) by mouth at bedtime., Disp: 30 capsule, Rfl: 0   prochlorperazine (COMPAZINE) 5 MG tablet, TAKE 1 TABLET(5 MG) BY MOUTH EVERY 6 HOURS AS NEEDED FOR VOMITING OR NAUSEA, Disp: 30 tablet, Rfl: 1   QUEtiapine (SEROQUEL) 400 MG tablet, Take 400 mg by mouth at bedtime. , Disp: , Rfl:    rosuvastatin (CRESTOR) 10 MG tablet, Take 1 tablet (10 mg total) by mouth daily., Disp: 90 tablet, Rfl: 1   Tiotropium Bromide Monohydrate (SPIRIVA RESPIMAT) 1.25 MCG/ACT AERS, Inhale 2 puffs into the lungs daily., Disp: 4 each, Rfl: 0  Observations/Objective: Patient is well-developed, well-nourished in no acute distress.  Resting comfortably  at home.  Head is normocephalic, atraumatic.  No labored breathing.  Speech is clear and coherent with logical content.  Patient is alert and oriented at baseline.    Assessment and Plan:  1. Acute bacterial sinusitis  - doxycycline (VIBRA-TABS) 100 MG tablet; Take 1 tablet (100 mg total) by mouth 2 (two) times daily for 10 days.  Dispense: 20 tablet; Refill: 0  2. Acute bronchitis, unspecified organism (Primary)  - doxycycline (VIBRA-TABS) 100 MG tablet; Take 1 tablet (100 mg total) by mouth 2 (two) times daily for 10 days.  Dispense: 20 tablet; Refill: 0  -immunocompromised- will give treatment with doxy and strict in person precautions reviewed for her to follow up in person if not improving or worsening  - Take meds as prescribed - Rest voice - Use a cool mist humidifier especially during the winter months when heat dries out the air. - Use saline nose sprays frequently to help soothe nasal passages if they are drying out. - Stay hydrated by drinking plenty of fluids - Keep thermostat turn down low to prevent drying out which can cause a dry cough.  - For fever or aches or pains- take tylenol or ibuprofen as directed on bottle             * for fevers greater than 101 orally you may alternate  ibuprofen and tylenol every 3 hours.  If you do not improve you will need a follow up visit in person.              Reviewed side effects, risks and benefits of medication.    Patient acknowledged agreement and understanding of the plan.   Past Medical, Surgical, Social History, Allergies, and Medications have been Reviewed.      Follow Up Instructions: I discussed the assessment and treatment plan with the patient. The patient was provided an opportunity to ask questions and all were answered. The patient agreed with the plan and demonstrated an understanding of the instructions.  A copy of instructions were sent to the patient via MyChart unless otherwise noted below.    The patient was advised to call back or seek an in-person evaluation if the symptoms worsen or if the condition fails to improve as anticipated.    Freddy Finner, NP

## 2023-10-08 ENCOUNTER — Encounter: Payer: Medicare HMO | Admitting: Gastroenterology

## 2023-10-11 ENCOUNTER — Ambulatory Visit: Payer: Medicare HMO

## 2023-10-12 ENCOUNTER — Inpatient Hospital Stay: Payer: Medicare HMO

## 2023-10-12 VITALS — BP 150/92 | HR 100 | Temp 99.1°F | Resp 16

## 2023-10-12 DIAGNOSIS — Z5111 Encounter for antineoplastic chemotherapy: Secondary | ICD-10-CM | POA: Diagnosis not present

## 2023-10-12 DIAGNOSIS — D509 Iron deficiency anemia, unspecified: Secondary | ICD-10-CM | POA: Diagnosis not present

## 2023-10-12 DIAGNOSIS — C50412 Malignant neoplasm of upper-outer quadrant of left female breast: Secondary | ICD-10-CM | POA: Diagnosis not present

## 2023-10-12 MED ORDER — SODIUM CHLORIDE 0.9% FLUSH
10.0000 mL | Freq: Once | INTRAVENOUS | Status: AC
  Administered 2023-10-12: 10 mL via INTRAVENOUS

## 2023-10-12 MED ORDER — IRON SUCROSE 20 MG/ML IV SOLN
200.0000 mg | Freq: Once | INTRAVENOUS | Status: AC
  Administered 2023-10-12: 200 mg via INTRAVENOUS
  Filled 2023-10-12: qty 10

## 2023-10-12 MED ORDER — HEPARIN SOD (PORK) LOCK FLUSH 100 UNIT/ML IV SOLN
500.0000 [IU] | Freq: Once | INTRAVENOUS | Status: AC
  Administered 2023-10-12: 500 [IU] via INTRAVENOUS

## 2023-10-12 NOTE — Progress Notes (Signed)
Pt declined to be observed for 30 minutes post Venofer infusion. Pt tolerated Tx well w/out incident. VSS at discharge.  Ambulatory to lobby.

## 2023-10-12 NOTE — Patient Instructions (Signed)
Iron Sucrose Injection What is this medication? IRON SUCROSE (EYE ern SOO krose) treats low levels of iron (iron deficiency anemia) in people with kidney disease. Iron is a mineral that plays an important role in making red blood cells, which carry oxygen from your lungs to the rest of your body. This medicine may be used for other purposes; ask your health care provider or pharmacist if you have questions. COMMON BRAND NAME(S): Venofer What should I tell my care team before I take this medication? They need to know if you have any of these conditions: Anemia not caused by low iron levels Heart disease High levels of iron in the blood Kidney disease Liver disease An unusual or allergic reaction to iron, other medications, foods, dyes, or preservatives Pregnant or trying to get pregnant Breastfeeding How should I use this medication? This medication is for infusion into a vein. It is given in a hospital or clinic setting. Talk to your care team about the use of this medication in children. While this medication may be prescribed for children as young as 2 years for selected conditions, precautions do apply. Overdosage: If you think you have taken too much of this medicine contact a poison control center or emergency room at once. NOTE: This medicine is only for you. Do not share this medicine with others. What if I miss a dose? Keep appointments for follow-up doses. It is important not to miss your dose. Call your care team if you are unable to keep an appointment. What may interact with this medication? Do not take this medication with any of the following: Deferoxamine Dimercaprol Other iron products This medication may also interact with the following: Chloramphenicol Deferasirox This list may not describe all possible interactions. Give your health care provider a list of all the medicines, herbs, non-prescription drugs, or dietary supplements you use. Also tell them if you smoke,  drink alcohol, or use illegal drugs. Some items may interact with your medicine. What should I watch for while using this medication? Visit your care team regularly. Tell your care team if your symptoms do not start to get better or if they get worse. You may need blood work done while you are taking this medication. You may need to follow a special diet. Talk to your care team. Foods that contain iron include: whole grains/cereals, dried fruits, beans, or peas, leafy green vegetables, and organ meats (liver, kidney). What side effects may I notice from receiving this medication? Side effects that you should report to your care team as soon as possible: Allergic reactions--skin rash, itching, hives, swelling of the face, lips, tongue, or throat Low blood pressure--dizziness, feeling faint or lightheaded, blurry vision Shortness of breath Side effects that usually do not require medical attention (report to your care team if they continue or are bothersome): Flushing Headache Joint pain Muscle pain Nausea Pain, redness, or irritation at injection site This list may not describe all possible side effects. Call your doctor for medical advice about side effects. You may report side effects to FDA at 1-800-FDA-1088. Where should I keep my medication? This medication is given in a hospital or clinic. It will not be stored at home. NOTE: This sheet is a summary. It may not cover all possible information. If you have questions about this medicine, talk to your doctor, pharmacist, or health care provider.  2024 Elsevier/Gold Standard (2023-03-15 00:00:00)

## 2023-10-18 ENCOUNTER — Ambulatory Visit: Payer: Medicare HMO

## 2023-10-19 ENCOUNTER — Inpatient Hospital Stay: Payer: Medicare HMO

## 2023-10-24 ENCOUNTER — Encounter: Payer: Self-pay | Admitting: Neurology

## 2023-10-24 ENCOUNTER — Encounter: Payer: Medicare HMO | Admitting: Neurology

## 2023-10-24 DIAGNOSIS — Z029 Encounter for administrative examinations, unspecified: Secondary | ICD-10-CM

## 2023-10-25 ENCOUNTER — Encounter: Payer: Self-pay | Admitting: Adult Health

## 2023-10-26 ENCOUNTER — Inpatient Hospital Stay: Payer: Medicare HMO | Attending: Oncology

## 2023-10-26 VITALS — BP 145/100 | HR 107 | Temp 97.5°F | Resp 18 | Ht 69.0 in

## 2023-10-26 DIAGNOSIS — D509 Iron deficiency anemia, unspecified: Secondary | ICD-10-CM | POA: Diagnosis not present

## 2023-10-26 MED ORDER — IRON SUCROSE 20 MG/ML IV SOLN
200.0000 mg | Freq: Once | INTRAVENOUS | Status: AC
  Administered 2023-10-26: 200 mg via INTRAVENOUS
  Filled 2023-10-26: qty 10

## 2023-10-26 NOTE — Progress Notes (Signed)
 Tolerated IV iron push well. Declined 30 minute post observation period. Ambulatory to the lobby without incident.

## 2023-10-31 ENCOUNTER — Encounter: Payer: Self-pay | Admitting: Gastroenterology

## 2023-10-31 ENCOUNTER — Encounter: Payer: Medicare HMO | Admitting: Gastroenterology

## 2023-10-31 ENCOUNTER — Telehealth: Payer: Self-pay | Admitting: Gastroenterology

## 2023-10-31 NOTE — Telephone Encounter (Signed)
 Noted.

## 2023-10-31 NOTE — Telephone Encounter (Signed)
 Hi Dr Myrtie Neither,   I called patient regarding her appointment, she stated she forgot about the appointment. I will no Show her for today,   Thank you

## 2023-10-31 NOTE — Telephone Encounter (Signed)
 I am placing this note for documentation purposes.  This patient also did not show for a clinic visit on 10/08/23 for her second hemorrhoidal banding appointment.  I will send this patient a letter informing her that one more no-show will lead to dismissal from the practice.  - H. Legrand, MD

## 2023-11-07 ENCOUNTER — Encounter: Payer: Self-pay | Admitting: Family Medicine

## 2023-11-12 ENCOUNTER — Telehealth: Payer: Self-pay | Admitting: *Deleted

## 2023-11-12 DIAGNOSIS — D500 Iron deficiency anemia secondary to blood loss (chronic): Secondary | ICD-10-CM

## 2023-11-12 DIAGNOSIS — C50912 Malignant neoplasm of unspecified site of left female breast: Secondary | ICD-10-CM

## 2023-11-12 DIAGNOSIS — D509 Iron deficiency anemia, unspecified: Secondary | ICD-10-CM

## 2023-11-12 NOTE — Telephone Encounter (Signed)
This RN spoke with pt per her call stating she has now moved to the [place] area and needs to get an appt locally to obtained continued therapy of fulvestrant for known breast cancer and IV venofer for anemia.  Henchy stated " I missed my appt for my injection because I have moved and I called the [facility] but they said I needed a referral"  Note pt's last fulvestrant was on 10/04/2023 and was due last week.  She completed her IV iron replacement at this time.  This  RN contacted the [employer] at location [facility].  Given fax number of [phone] to send referral and documents.

## 2023-11-14 ENCOUNTER — Telehealth: Payer: Self-pay

## 2023-11-14 NOTE — Telephone Encounter (Signed)
[facility] sent fax to PCP for signature and diagnoses.  Form found in RN box.   Faxed to [employer].

## 2023-11-15 ENCOUNTER — Ambulatory Visit: Payer: Medicare HMO | Admitting: Student

## 2023-11-15 ENCOUNTER — Telehealth: Payer: Self-pay | Admitting: Family Medicine

## 2023-11-15 NOTE — Telephone Encounter (Signed)
Completed [facility] form x2 to verify diagnosis with test codes for peripheral neuropathy testing. Signed and placed in "To Be Faxed".  Unsure if this will require attending signature, no note was left to place in attending box on form.  Fortunato Curling, DO

## 2023-11-27 ENCOUNTER — Telehealth: Payer: Self-pay

## 2023-11-27 NOTE — Telephone Encounter (Signed)
 [facility] diagnoses form found in [employer].   Faxed to [employer] per instruction.   Of note, it appears this has been signed before, see message from [place].   Please let me know if this form is sent back.   Unsure if an attending to sign this.

## 2023-11-28 ENCOUNTER — Ambulatory Visit: Payer: Medicare HMO | Admitting: Student

## 2023-12-06 DIAGNOSIS — E559 Vitamin D deficiency, unspecified: Secondary | ICD-10-CM | POA: Diagnosis not present

## 2023-12-06 DIAGNOSIS — Z170 Estrogen receptor positive status [ER+]: Secondary | ICD-10-CM | POA: Diagnosis not present

## 2023-12-06 DIAGNOSIS — C50911 Malignant neoplasm of unspecified site of right female breast: Secondary | ICD-10-CM | POA: Diagnosis not present

## 2023-12-06 DIAGNOSIS — E611 Iron deficiency: Secondary | ICD-10-CM | POA: Diagnosis not present

## 2023-12-24 ENCOUNTER — Other Ambulatory Visit: Payer: Self-pay | Admitting: *Deleted

## 2023-12-24 ENCOUNTER — Telehealth: Payer: Self-pay | Admitting: *Deleted

## 2023-12-24 NOTE — Telephone Encounter (Signed)
 This RN spoke with pt per her call stating she was seen 1 time at [facility] but they have not called her to schedule the fulvestrant .  She states overall displeasure with her visit stating "they didn't have valet parking and the lady that took my blood didn't even talk or look at me"  This RN noted per Care Everywhere that Kimberly [name] was seen on 12/05/2026 with plan to proceed with fulvestrant therapy.  This RN discussed above and that this RN could call to follow up - Kimberly [name] verbally is very adamant on returning to this office for her care.  This RN discussed concern for continuity with Kimberly [name] stating " oh I will come every month to get my shot ".  Appointment made per above for tomorrow for injection.

## 2023-12-25 ENCOUNTER — Inpatient Hospital Stay: Attending: Oncology

## 2023-12-25 VITALS — BP 135/90 | HR 104 | Resp 18

## 2023-12-25 DIAGNOSIS — Z5111 Encounter for antineoplastic chemotherapy: Secondary | ICD-10-CM | POA: Diagnosis not present

## 2023-12-25 DIAGNOSIS — D509 Iron deficiency anemia, unspecified: Secondary | ICD-10-CM

## 2023-12-25 DIAGNOSIS — C50412 Malignant neoplasm of upper-outer quadrant of left female breast: Secondary | ICD-10-CM | POA: Diagnosis not present

## 2023-12-25 MED ORDER — FULVESTRANT 250 MG/5ML IM SOSY
500.0000 mg | PREFILLED_SYRINGE | Freq: Once | INTRAMUSCULAR | Status: AC
  Administered 2023-12-25: 500 mg via INTRAMUSCULAR
  Filled 2023-12-25: qty 10

## 2023-12-26 DIAGNOSIS — R8290 Unspecified abnormal findings in urine: Secondary | ICD-10-CM | POA: Diagnosis not present

## 2023-12-26 DIAGNOSIS — R350 Frequency of micturition: Secondary | ICD-10-CM | POA: Diagnosis not present

## 2023-12-27 ENCOUNTER — Telehealth: Payer: Self-pay | Admitting: Hematology and Oncology

## 2023-12-27 NOTE — Telephone Encounter (Signed)
 Left patient a voicemail in regards to scheduled appointment times/dates; left callback number to scheduling if patient needs to cancel or reschedule

## 2023-12-31 ENCOUNTER — Encounter: Payer: Self-pay | Admitting: Adult Health

## 2024-01-22 ENCOUNTER — Inpatient Hospital Stay

## 2024-01-24 ENCOUNTER — Inpatient Hospital Stay: Attending: Oncology

## 2024-01-24 VITALS — BP 136/90 | HR 99 | Temp 98.3°F | Resp 18

## 2024-01-24 DIAGNOSIS — Z5111 Encounter for antineoplastic chemotherapy: Secondary | ICD-10-CM | POA: Diagnosis not present

## 2024-01-24 DIAGNOSIS — D509 Iron deficiency anemia, unspecified: Secondary | ICD-10-CM

## 2024-01-24 DIAGNOSIS — C50412 Malignant neoplasm of upper-outer quadrant of left female breast: Secondary | ICD-10-CM | POA: Insufficient documentation

## 2024-01-24 MED ORDER — FULVESTRANT 250 MG/5ML IM SOSY
500.0000 mg | PREFILLED_SYRINGE | Freq: Once | INTRAMUSCULAR | Status: AC
  Administered 2024-01-24: 500 mg via INTRAMUSCULAR
  Filled 2024-01-24: qty 10

## 2024-02-19 ENCOUNTER — Inpatient Hospital Stay

## 2024-03-17 ENCOUNTER — Telehealth: Payer: Self-pay

## 2024-03-17 NOTE — Telephone Encounter (Signed)
 Tried to call to confirm appts for 5/28 but voicemail is full.

## 2024-03-18 ENCOUNTER — Inpatient Hospital Stay (HOSPITAL_BASED_OUTPATIENT_CLINIC_OR_DEPARTMENT_OTHER): Admitting: Hematology and Oncology

## 2024-03-18 ENCOUNTER — Inpatient Hospital Stay: Attending: Oncology

## 2024-03-18 ENCOUNTER — Inpatient Hospital Stay

## 2024-03-18 ENCOUNTER — Other Ambulatory Visit: Payer: Self-pay | Admitting: *Deleted

## 2024-03-18 VITALS — BP 152/99 | HR 102 | Temp 98.2°F | Resp 18 | Wt 277.7 lb

## 2024-03-18 DIAGNOSIS — Z170 Estrogen receptor positive status [ER+]: Secondary | ICD-10-CM | POA: Diagnosis not present

## 2024-03-18 DIAGNOSIS — C50412 Malignant neoplasm of upper-outer quadrant of left female breast: Secondary | ICD-10-CM | POA: Diagnosis not present

## 2024-03-18 DIAGNOSIS — D509 Iron deficiency anemia, unspecified: Secondary | ICD-10-CM | POA: Insufficient documentation

## 2024-03-18 DIAGNOSIS — D500 Iron deficiency anemia secondary to blood loss (chronic): Secondary | ICD-10-CM

## 2024-03-18 DIAGNOSIS — C50912 Malignant neoplasm of unspecified site of left female breast: Secondary | ICD-10-CM

## 2024-03-18 DIAGNOSIS — Z5111 Encounter for antineoplastic chemotherapy: Secondary | ICD-10-CM | POA: Diagnosis not present

## 2024-03-18 LAB — CMP (CANCER CENTER ONLY)
ALT: 7 U/L (ref 0–44)
AST: 13 U/L — ABNORMAL LOW (ref 15–41)
Albumin: 4.6 g/dL (ref 3.5–5.0)
Alkaline Phosphatase: 77 U/L (ref 38–126)
Anion gap: 7 (ref 5–15)
BUN: 11 mg/dL (ref 6–20)
CO2: 28 mmol/L (ref 22–32)
Calcium: 9.9 mg/dL (ref 8.9–10.3)
Chloride: 103 mmol/L (ref 98–111)
Creatinine: 1.1 mg/dL — ABNORMAL HIGH (ref 0.44–1.00)
GFR, Estimated: >60 mL/min (ref >60)
Glucose, Bld: 90 mg/dL (ref 70–99)
Potassium: 3.9 mmol/L (ref 3.5–5.1)
Sodium: 138 mmol/L (ref 135–145)
Total Bilirubin: 0.4 mg/dL (ref 0.0–1.2)
Total Protein: 8.3 g/dL — ABNORMAL HIGH (ref 6.5–8.1)

## 2024-03-18 LAB — CBC WITH DIFFERENTIAL (CANCER CENTER ONLY)
Abs Immature Granulocytes: 0.02 10*3/uL (ref 0.00–0.07)
Basophils Absolute: 0.1 10*3/uL (ref 0.0–0.1)
Basophils Relative: 1 %
Eosinophils Absolute: 0 10*3/uL (ref 0.0–0.5)
Eosinophils Relative: 0 %
HCT: 38.7 % (ref 36.0–46.0)
Hemoglobin: 12.5 g/dL (ref 12.0–15.0)
Immature Granulocytes: 0 %
Lymphocytes Relative: 35 %
Lymphs Abs: 2.2 10*3/uL (ref 0.7–4.0)
MCH: 25.6 pg — ABNORMAL LOW (ref 26.0–34.0)
MCHC: 32.3 g/dL (ref 30.0–36.0)
MCV: 79.3 fL — ABNORMAL LOW (ref 80.0–100.0)
Monocytes Absolute: 0.5 10*3/uL (ref 0.1–1.0)
Monocytes Relative: 8 %
Neutro Abs: 3.5 10*3/uL (ref 1.7–7.7)
Neutrophils Relative %: 56 %
Platelet Count: 281 10*3/uL (ref 150–400)
RBC: 4.88 MIL/uL (ref 3.87–5.11)
RDW: 14 % (ref 11.5–15.5)
WBC Count: 6.3 10*3/uL (ref 4.0–10.5)
nRBC: 0 % (ref 0.0–0.2)

## 2024-03-18 MED ORDER — FULVESTRANT 250 MG/5ML IM SOSY
500.0000 mg | PREFILLED_SYRINGE | Freq: Once | INTRAMUSCULAR | Status: AC
  Administered 2024-03-18: 500 mg via INTRAMUSCULAR
  Filled 2024-03-18: qty 10

## 2024-03-18 NOTE — Progress Notes (Signed)
 [facility] Cancer Follow up:    Kimberly Huh, MD [address] [place] [address]   DIAGNOSIS:  Cancer Staging  No matching staging information was found for the patient.   SUMMARY OF ONCOLOGIC HISTORY: Oncology History Overview Note  The patient had bilateral screening mammography with tomography 03/19/2014. This was the patient's first ever mammography. Showed a possible mass in the left breast. Left diagnostic mammography and ultrasonography 04/01/2014 showed an irregular mass in the upper outer quadrant of the left breast with a possible satellite 1 cm anterior to it. On physical exam there was a firm palpable mass at the 1:00 position of the left breast 8 cm from the nipple. There was no palpable left axillary adenopathy. Ultrasound showed an irregular hypoechoic mass in the area in question measuring 1.4 cm. There was a 4 mm nodule located anterior to this. Ultrasound of the left axilla was benign.  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 The second mass was negative for malignancy. This was felt to be concordant.  On 04/08/2014 the patient underwent bilateral breast MRI. This showed a 9 mm enhancing mass in the subareolar area of the right breast in in the left breast, the previously noted mass measuring 1.3 cm. There were no morphologically abnormal lymph nodes  The right breast finding was followed up on 04/19/2014 with ultrasound which showed an intraductal soft tissue mass in the inferior subareolar worsening of the right breast measuring 8 mm. This was biopsied 04/19/2014 and showed ([id number]) ectatic duct, with no evidence of malignancy. Surgical excision  was recommended.  Accordingly on 05/03/2014 the patient underwent right lumpectomy, showing an intraductal papilloma, with known malignancy identified. Left lumpectomy on the same day showed an invasive ductal carcinoma measuring 1.5 cm, with one of the 2 sentinel lymph nodes positive for carcinoma. There was no extracapsular extension. Margins were clear and ample. HER-2 was repeated and was again negative.  The patient's case was discussed in the multidisciplinary breast cancer conference 05/12/2014. An Oncotype had been previously requested and this showed a recurrent score of 22, in the intermediate range. The patient qualifies for the S1007 study and this will be discussed with her. Otherwise the standard recommendation would be chemotherapy followed by radiation followed by anti-estrogens. Oncotype score of 22 predicts a risk of outside the breast recurrence within 10 years of 13% if the patient's only systemic therapy is tamoxifen for 5 years.  (2) adjuvant chemotherapy with cyclophosphamide  and docetaxel  started 07/06/2014, patient tolerated chemotherapy very poorly and it was discontinued after one cycle.    (3) adjuvant radiation completed 10/21/2014 1) Left Breast / 50.4 Gy in 28 fractions 2) Left supraclavicular / 50.4 Gy in 28 fractions 3) Left Posterior axillary boost  / 9.52 Gy in 28 fractions 4) Left Breast Boost / 10 Gy in 5 fractions  (4) anastrozole  started 01/19/2015-[name] informed me she never started this.   METASTATIC DISEASE to skin: September 2022 (5) mammography 05/24/2021 shows fluid collection in the right axilla, no obvious malignancy within each breast but superficial skin lesions throughout the upper outer left breast.  (A) CT scan of the abdomen and pelvis with contrast 06/10/2021 shows small low-attenuation liver  lesions which could be cysts but no definite evidence of metastatic disease.  (B) chest CT scan 07/14/2021 shows no evidence of metastatic  disease  (C) CT head with and without contrast on 07/15/2021 shows no evidence of metastatic disease (D) biopsy left breast skin lesion 07/25/2021 shows metastatic carcinoma, estrogen and progesterone receptor positive, HER2 not amplified (1+)  (E) excision right axillary mass 07/25/2021 shows benign epidermoid cyst (E) CA 27-29 on 07/05/2021 is normal at 21.1  (6) iron  deficiency anemia: On 07/05/2021 the ferritin was less than 4 and the iron  saturation 3%; hemoglobin was 9.1 with an MCV of 71.3  (A) Venofer  started on 07/18/2021, to be repeated x5   (B) GI evaluation 08/11/2021  (7) to start fulvestrant  09/13/2021  (A) to start abemaciclib  50 mg twice daily starting 09/13/2021  (8) status post left mastectomy 08/28/2021 for a pT3 pNX invasive lobular carcinoma, grade 2, with negative margins.   Recurrent breast cancer, left (HCC)  04/14/2014 Initial Diagnosis   Recurrent breast cancer, left (HCC)     CURRENT THERAPY: Faslodex   INTERVAL HISTORY: Kimberly [name] 53 y.o. female with a history of recurrent breast cancer, presents for a routine follow-up.   Discussed the use of AI scribe software for clinical note transcription with the patient, who gave verbal consent to proceed.  History of Present Illness Kimberly [name] is a 52 year old female with a history of breast cancer who presents for follow-up and discussion of breast reconstruction options.  She is currently receiving monthly Faslodex  injections and experiences pain at the injection site, which she manages with ice packs and a heating pad.   She feels self-conscious about her appearance, particularly after a recent cruise where she felt like 'the only one there without a breast.' She has not yet consulted a Engineer, petroleum and is uncertain about the breast reconstruction process post-radiation. A friend informed her that reconstruction might be possible at no cost due to her previous mastectomy.  No changes in breathing,  bowel movements, or urination. Her godson's wife and mother-in-law both had breast cancer and underwent augmentation post-treatment. She recently went on a cruise to [place], [place], and [address].     Patient Active Problem List   Diagnosis Date Noted   Hypothyroidism 08/23/2023   Pain in both hands 08/23/2023   Peripheral neuropathy 04/26/2023   Upper respiratory infection 11/28/2022   Paronychia of great toe of left foot 09/01/2022   Hypokalemia 06/28/2022   Acute respiratory failure with hypoxia (HCC)    Bipolar depression (HCC)    Chronic pain of right knee 03/08/2022   S/P mastectomy, left 08/28/2021   Goals of care, counseling/discussion 07/18/2021   Microcytic anemia 07/05/2021   History of COVID-19 01/27/2021   Elevated LDL cholesterol level 12/30/2020   COPD exacerbation (HCC) 08/24/2020   Oligouria 03/10/2020   History of breast cancer 02/11/2020   Total body pain 01/13/2019   Postmenopausal bleeding 10/25/2017   Chronic pain of both knees 05/03/2017   COPD (chronic obstructive pulmonary disease) (HCC) 03/20/2017   Tachycardia    Mixed incontinence 09/29/2015   Genetic testing 08/26/2015   Recurrent breast cancer, left (HCC) 04/14/2014   Disorder of bladder 09/29/2008   LOW BACK PAIN, CHRONIC 09/29/2008   Morbid obesity (HCC) 11/19/2007   Iron  deficiency anemia 11/14/2007   Mixed bipolar I disorder (HCC) 02/20/2007   Tobacco abuse 02/20/2007    has no known allergies.  MEDICAL HISTORY: Past Medical History:  Diagnosis Date   Acute  cholecystitis 04/17/2019   Anemia    Anxiety    Arthritis    Bipolar 1 disorder (HCC)    Blood transfusion without reported diagnosis 2007   post bleeding childbirth   Cancer [facility])    left breast cancer 2015   COPD (chronic obstructive pulmonary disease) (HCC)    pt reports she is not on oxygen    COVID-19 virus infection 06/07/2019   Depression    Diabetes mellitus without complication (HCC)    gestational   Hand  injury, right, initial encounter 03/19/2019   Heart murmur    as a child-not adult-no cardiac work up   History of radiation therapy 08/31/14-10/21/14   left breast/ left supraclavicular 50.4 Gy 28 fx, lef tposterior axillary boost 9.52 Gy 28 fx, left rbeast boost/ 10 Gy 5 fx   Hypothyroidism    Personal history of chemotherapy    Personal history of radiation therapy    Rash and nonspecific skin eruption 12/27/2020    SURGICAL HISTORY: Past Surgical History:  Procedure Laterality Date   BREAST BIOPSY Left 04/01/14   BREAST BIOPSY Right 04/19/14   BREAST LUMPECTOMY WITH AXILLARY LYMPH NODE DISSECTION Bilateral 05/03/14   CHOLECYSTECTOMY N/A 04/18/2019   Procedure: LAPAROSCOPIC CHOLECYSTECTOMY WITH INTRAOPERATIVE CHOLANGIOGRAM;  Surgeon: Caralyn Chandler, MD;  Location: [facility] ORS;  Service: General;  Laterality: N/A;   DILATION AND CURETTAGE OF UTERUS     after childbirth   MASS EXCISION Right 07/25/2021   Procedure: RIGHT AXILLARY MASS EXCISION;  Surgeon: Enid Harry, MD;  Location: [facility] OR;  Service: General;  Laterality: Right;   MINOR BREAST BIOPSY Left 07/25/2021   Procedure: SKIN PUNCH BIOPSY LEFT BREAST;  Surgeon: Enid Harry, MD;  Location: [facility] OR;  Service: General;  Laterality: Left;   MULTIPLE TOOTH EXTRACTIONS     only 5 left   PORTACATH PLACEMENT Right 06/18/2014   Procedure: INSERTION PORT-A-CATH;  Surgeon: Enid Harry, MD;  Location: [facility];  Service: General;  Laterality: Right;   TONSILLECTOMY AND ADENOIDECTOMY     TOTAL MASTECTOMY Left 08/28/2021   Procedure: LEFT TOTAL MASTECTOMY;  Surgeon: Enid Harry, MD;  Location: [facility] OR;  Service: General;  Laterality: Left;  90 MINUTES- POST PER KELLY AND SHE WILL PUT IN ROOM    SOCIAL HISTORY: Social History   Socioeconomic History   Marital status: Widowed    Spouse name: Not on file   Number of children: 5   Years of education: 12   Highest education level: High school graduate   Occupational History   Occupation: disabled  Tobacco Use   Smoking status: Every Day    Current packs/day: 0.50    Average packs/day: 0.5 packs/day for 34.0 years (17.0 ttl pk-yrs)    Types: Cigarettes   Smokeless tobacco: Never  Vaping Use   Vaping status: Never Used  Substance and Sexual Activity   Alcohol use: No   Drug use: No   Sexual activity: Not Currently  Other Topics Concern   Not on file  Social History Narrative   Patient lives in [place] with 3 of her children.    One adult son lives "up the road" and one lives in [place] .   Patient is on disability.    Patient enjoys spending time with her family and watching TV.    Patient walks around her home for exercise.       Are you right handed or left handed? Right handed   Are you currently employed ? Disability  What is your current occupation?   Do you live at home alone? Lives with son   Who lives with you?    What type of home do you live in: 1 story or 2 story? Lives in a two story home       Social Drivers of Health   Financial Resource Strain: Low Risk  (06/21/2023)   Overall Financial Resource Strain (CARDIA)    Difficulty of Paying Living Expenses: Not very hard  Food Insecurity: Food Insecurity Present (06/21/2023)   Hunger Vital Sign    Worried About Running Out of Food in the Last Year: Sometimes true    Ran Out of Food in the Last Year: Never true  Transportation Needs: No Transportation Needs (06/21/2023)   PRAPARE - Administrator, Civil Service (Medical): No    Lack of Transportation (Non-Medical): No  Physical Activity: Inactive (06/21/2023)   Exercise Vital Sign    Days of Exercise per Week: 0 days    Minutes of Exercise per Session: 0 min  Stress: No Stress Concern Present (06/21/2023)   [employer] of Occupational Health - Occupational Stress Questionnaire    Feeling of Stress : Not at all  Social Connections: Moderately Isolated (06/21/2023)   Social Connection and  Isolation Panel [NHANES]    Frequency of Communication with Friends and Family: More than three times a week    Frequency of Social Gatherings with Friends and Family: Three times a week    Attends Religious Services: More than 4 times per year    Active Member of Clubs or Organizations: No    Attends Banker Meetings: Never    Marital Status: Widowed  Intimate Partner Violence: Not At Risk (06/21/2023)   Humiliation, Afraid, Rape, and Kick questionnaire    Fear of Current or Ex-Partner: No    Emotionally Abused: No    Physically Abused: No    Sexually Abused: No    FAMILY HISTORY: Family History  Problem Relation Age of Onset   Heart disease Father    Cancer Father        Prostate   Cancer Maternal Grandmother    Depression Maternal Grandfather    Cancer Paternal Grandmother    Stomach cancer Neg Hx    Esophageal cancer Neg Hx    Rectal cancer Neg Hx     Review of Systems  Constitutional:  Negative for appetite change, chills, fatigue, fever and unexpected weight change.  HENT:   Negative for hearing loss, lump/mass and trouble swallowing.   Eyes:  Negative for eye problems and icterus.  Respiratory:  Negative for chest tightness, cough and shortness of breath.   Cardiovascular:  Negative for chest pain, leg swelling and palpitations.  Gastrointestinal:  Negative for abdominal distention, abdominal pain, constipation, diarrhea, nausea and vomiting.  Endocrine: Negative for hot flashes.  Genitourinary:  Negative for difficulty urinating.   Musculoskeletal:  Positive for arthralgias (left thumb stiffness and discomfort).  Skin:  Negative for itching and rash.  Neurological:  Negative for dizziness, extremity weakness, headaches and numbness.  Hematological:  Negative for adenopathy. Does not bruise/bleed easily.  Psychiatric/Behavioral:  Negative for depression. The patient is not nervous/anxious.       PHYSICAL EXAMINATION    Vitals:   03/18/24 1529  03/18/24 1530  BP: (!) 143/100 (!) 152/99  Pulse: (!) 102   Resp: 18   Temp: 98.2 F (36.8 C)   SpO2: 100%     Physical Exam Constitutional:  General: She is not in acute distress.    Appearance: Normal appearance. She is not toxic-appearing.  HENT:     Head: Normocephalic and atraumatic.     Mouth/Throat:     Mouth: Mucous membranes are moist.     Pharynx: Oropharynx is clear. No oropharyngeal exudate or posterior oropharyngeal erythema.  Eyes:     General: No scleral icterus. Cardiovascular:     Rate and Rhythm: Normal rate and regular rhythm.     Pulses: Normal pulses.     Heart sounds: Normal heart sounds.  Pulmonary:     Effort: Pulmonary effort is normal.     Breath sounds: Normal breath sounds.  Chest:     Comments: Right breast benign, left breast s/p mastectomy and radiation, no sign of local recurrence Abdominal:     General: Abdomen is flat. Bowel sounds are normal. There is no distension.     Palpations: Abdomen is soft.     Tenderness: There is no abdominal tenderness.  Musculoskeletal:        General: No swelling.     Cervical back: Neck supple.  Lymphadenopathy:     Cervical: No cervical adenopathy.     Upper Body:     Right upper body: No axillary adenopathy.     Left upper body: No axillary adenopathy.  Skin:    General: Skin is warm and dry.     Findings: No rash.  Neurological:     General: No focal deficit present.     Mental Status: She is alert.  Psychiatric:        Mood and Affect: Mood normal.        Behavior: Behavior normal.       ASSESSMENT and THERAPY PLAN:   Recurrent breast cancer, left (HCC) Assessment and Plan Assessment & Plan Breast cancer, post-mastectomy Breast cancer treated with mastectomy and two rounds of radiation therapy. She desires breast reconstruction. Post-radiation skin presents challenges for reconstruction due to poor healing. - Refer to plastic surgeon Dr. Marieta Shorten for consultation regarding breast  reconstruction options. -She will otherwise continue adj faslodex  ( She was on verzenio  and AI but had significant non compliance issues) hence we decided on adj faslodex . - Mammogram of right breast ordered - RTC in 6 months.  Murleen Arms MD      All questions were answered. The patient knows to call the clinic with any problems, questions or concerns. We can certainly see the patient much sooner if necessary.  Total encounter time:30 minutes*in face-to-face visit time, chart review, lab review, care coordination, order entry, and documentation of the encounter time.  *Total Encounter Time as defined by the Centers for Medicare and Medicaid Services includes, in addition to the face-to-face time of a patient visit (documented in the note above) non-face-to-face time: obtaining and reviewing outside history, ordering and reviewing medications, tests or procedures, care coordination (communications with other health care professionals or caregivers) and documentation in the medical record.

## 2024-03-18 NOTE — Assessment & Plan Note (Signed)
 Assessment and Plan Assessment & Plan Breast cancer, post-mastectomy Breast cancer treated with mastectomy and two rounds of radiation therapy. She desires breast reconstruction. Post-radiation skin presents challenges for reconstruction due to poor healing. - Refer to plastic surgeon Dr. Marieta Shorten for consultation regarding breast reconstruction options. -She will otherwise continue adj faslodex  ( She was on verzenio  and AI but had significant non compliance issues) hence we decided on adj faslodex . - Mammogram of right breast ordered - RTC in 6 months.  Kimberly Arms MD

## 2024-03-19 LAB — FERRITIN: Ferritin: 14 ng/mL (ref 11–307)

## 2024-03-20 DIAGNOSIS — H5213 Myopia, bilateral: Secondary | ICD-10-CM | POA: Diagnosis not present

## 2024-03-20 DIAGNOSIS — Z0100 Encounter for examination of eyes and vision without abnormal findings: Secondary | ICD-10-CM | POA: Diagnosis not present

## 2024-03-26 ENCOUNTER — Encounter: Payer: Self-pay | Admitting: Adult Health

## 2024-04-01 DIAGNOSIS — F315 Bipolar disorder, current episode depressed, severe, with psychotic features: Secondary | ICD-10-CM | POA: Diagnosis not present

## 2024-04-08 ENCOUNTER — Encounter: Payer: Self-pay | Admitting: Adult Health

## 2024-04-15 ENCOUNTER — Ambulatory Visit
Admission: RE | Admit: 2024-04-15 | Discharge: 2024-04-15 | Disposition: A | Discharge status: Home / Self Care | Source: Ambulatory Visit | Attending: Hematology and Oncology | Admitting: Hematology and Oncology

## 2024-04-15 ENCOUNTER — Inpatient Hospital Stay: Payer: Self-pay

## 2024-04-15 ENCOUNTER — Other Ambulatory Visit: Payer: Self-pay | Admitting: *Deleted

## 2024-04-15 ENCOUNTER — Telehealth: Payer: Self-pay | Admitting: Gastroenterology

## 2024-04-15 ENCOUNTER — Inpatient Hospital Stay: Attending: Oncology

## 2024-04-15 VITALS — BP 141/80 | HR 91 | Temp 98.7°F | Resp 20

## 2024-04-15 DIAGNOSIS — Z1231 Encounter for screening mammogram for malignant neoplasm of breast: Secondary | ICD-10-CM | POA: Diagnosis not present

## 2024-04-15 DIAGNOSIS — C50412 Malignant neoplasm of upper-outer quadrant of left female breast: Secondary | ICD-10-CM | POA: Diagnosis not present

## 2024-04-15 DIAGNOSIS — D508 Other iron deficiency anemias: Secondary | ICD-10-CM

## 2024-04-15 DIAGNOSIS — Z5111 Encounter for antineoplastic chemotherapy: Secondary | ICD-10-CM | POA: Diagnosis not present

## 2024-04-15 DIAGNOSIS — C50912 Malignant neoplasm of unspecified site of left female breast: Secondary | ICD-10-CM

## 2024-04-15 DIAGNOSIS — D509 Iron deficiency anemia, unspecified: Secondary | ICD-10-CM

## 2024-04-15 LAB — CBC WITH DIFFERENTIAL (CANCER CENTER ONLY)
Abs Immature Granulocytes: 0.02 10*3/uL (ref 0.00–0.07)
Basophils Absolute: 0.1 10*3/uL (ref 0.0–0.1)
Basophils Relative: 1 %
Eosinophils Absolute: 0 10*3/uL (ref 0.0–0.5)
Eosinophils Relative: 0 %
HCT: 34.9 % — ABNORMAL LOW (ref 36.0–46.0)
Hemoglobin: 11 g/dL — ABNORMAL LOW (ref 12.0–15.0)
Immature Granulocytes: 0 %
Lymphocytes Relative: 40 %
Lymphs Abs: 2.4 10*3/uL (ref 0.7–4.0)
MCH: 24.4 pg — ABNORMAL LOW (ref 26.0–34.0)
MCHC: 31.5 g/dL (ref 30.0–36.0)
MCV: 77.4 fL — ABNORMAL LOW (ref 80.0–100.0)
Monocytes Absolute: 0.6 10*3/uL (ref 0.1–1.0)
Monocytes Relative: 9 %
Neutro Abs: 3 10*3/uL (ref 1.7–7.7)
Neutrophils Relative %: 50 %
Platelet Count: 270 10*3/uL (ref 150–400)
RBC: 4.51 MIL/uL (ref 3.87–5.11)
RDW: 14.6 % (ref 11.5–15.5)
WBC Count: 6.1 10*3/uL (ref 4.0–10.5)
nRBC: 0 % (ref 0.0–0.2)

## 2024-04-15 LAB — SAMPLE TO BLOOD BANK

## 2024-04-15 MED ORDER — FULVESTRANT 250 MG/5ML IM SOSY
500.0000 mg | PREFILLED_SYRINGE | Freq: Once | INTRAMUSCULAR | Status: AC
  Administered 2024-04-15: 500 mg via INTRAMUSCULAR
  Filled 2024-04-15: qty 10

## 2024-04-15 NOTE — Progress Notes (Signed)
 Patient here for Faslodex  injection.  C/O fatique due to a large amount of blood loss from internal hemorrhoids bleed x 4 days.  Wants to know if she needed labs done?  Secure chatted with Dr. Loretha and Valerie/RN.  States patient can have labs done, but will need to keep her phone on her just incase her hemoglobin was low.  Patient made aware.  Appointment made for labs.

## 2024-04-15 NOTE — Telephone Encounter (Signed)
No answer.Mailbox is full and cannot accept messages

## 2024-04-15 NOTE — Telephone Encounter (Signed)
 Inbound call from patient to discuss her rectal bleeding, been experiencing this for over a week and would like to consult this with a nurse   Please advise  Thank you

## 2024-04-16 ENCOUNTER — Other Ambulatory Visit: Payer: Self-pay

## 2024-04-16 LAB — FERRITIN: Ferritin: 7 ng/mL — ABNORMAL LOW (ref 11–307)

## 2024-04-16 MED ORDER — HYDROCORTISONE ACETATE 25 MG RE SUPP: 25.0000 mg | Freq: Two times a day (BID) | RECTAL | 0 refills | Status: AC

## 2024-04-16 NOTE — Telephone Encounter (Signed)
 Called patient. She reports bright red painless rectal bleeding x 1 week. This occurs when she goes to the bathroom. She has not moved her bowels in 2 days because I am scared t will make me bleed worse. She describes a sensation of pressure at the rectum. She has not tried anything for her symptoms except putting pressure down there until it stops bleeding. She also tells me she had a hemorrhoid banding and I don't want to do that again. It made me feel like I needed to poop and pee at once. Please advise.

## 2024-04-16 NOTE — Telephone Encounter (Signed)
 Discussed with the patient. She agrees to this plan of care. Pharmacy confirmed. Referral placed to [facility].

## 2024-04-16 NOTE — Telephone Encounter (Signed)
 Patient returning call. Please advise. Thank you.

## 2024-04-16 NOTE — Telephone Encounter (Signed)
  She has known internal hemorrhoids that of the most likely source of this bleeding. She did not wish to have any further hemorrhoidal banding (she had a difficult time tolerating that procedure) and did not show for an upper endoscopy in January of this year  Recommendations: MiraLAX  1 capful a day  Preparation H suppository twice a day for 3 days  Consultation with a colorectal surgeon for consideration of surgical hemorrhoid to me  VEAR Brand MD

## 2024-04-17 ENCOUNTER — Telehealth: Payer: Self-pay | Admitting: *Deleted

## 2024-04-17 NOTE — Telephone Encounter (Addendum)
 Spoke with pt - she is taking iron  daily but feels like it is not being absorbed well due to continued low reading.  She would like to pursue IV iron  and is agreeable to coming up weekly x 3 to receive.  Thank you Val RN  ----- Message from Sutherland Iruku sent at 04/16/2024  5:12 PM EDT ----- I would make sure she is taking oral iron . If she is compliant and still has low ferritin, then we may consider IV iron .  Thanks, ----- Message ----- From: Hennie Rocky SAILOR, RN Sent: 04/16/2024   2:20 PM EDT To: Amber Stalls, MD  Hello, Patient called requesting lab results. Is there anything additional you would like me to share with her regarding yesterday's results? Thank you, Rocky

## 2024-04-20 ENCOUNTER — Other Ambulatory Visit: Payer: Self-pay | Admitting: Hematology and Oncology

## 2024-04-20 NOTE — Progress Notes (Signed)
 Pt prefers IV iron . Venofer  300 mg times 3 orders placed to be done at [facility].  Kimberly [name]

## 2024-04-21 ENCOUNTER — Telehealth: Payer: Self-pay

## 2024-04-21 NOTE — Telephone Encounter (Signed)
 Hello,  Patient will be scheduled as soon as possible.  Auth Submission: NO AUTH NEEDED Site of care: Site of care: [facility] Payer: [employer] medicare Medication & CPT/J Code(s) submitted: Venofer  (Iron  Sucrose) J1756 Diagnosis Code:  Route of submission (phone, fax, portal): portal Phone # Fax # Auth type: Buy/Bill PB Units/visits requested: 300mg  x 3doses Reference number:  Approval from: 04/21/24 to 10/21/24

## 2024-05-07 ENCOUNTER — Ambulatory Visit

## 2024-05-07 VITALS — BP 149/86 | HR 90 | Temp 98.0°F | Resp 20 | Ht 69.0 in | Wt 284.0 lb

## 2024-05-07 DIAGNOSIS — D509 Iron deficiency anemia, unspecified: Secondary | ICD-10-CM | POA: Diagnosis not present

## 2024-05-07 MED ORDER — HEPARIN SOD (PORK) LOCK FLUSH 100 UNIT/ML IV SOLN
500.0000 [IU] | Freq: Once | INTRAVENOUS | Status: AC | PRN
Start: 2024-05-07 — End: 2024-05-07
  Administered 2024-05-07: 500 [IU]
  Filled 2024-05-07: qty 5

## 2024-05-07 MED ORDER — SODIUM CHLORIDE 0.9 % IV SOLN
300.0000 mg | Freq: Once | INTRAVENOUS | Status: AC
  Administered 2024-05-07: 300 mg via INTRAVENOUS
  Filled 2024-05-07: qty 15

## 2024-05-07 NOTE — Progress Notes (Signed)
 Diagnosis: Iron  Deficiency Anemia  Provider:  Lonna Coder MD  Procedure: IV Infusion  IV Type: Port a Cath, IV Location: R Chest  Venofer  (Iron  Sucrose), Dose: 300 mg  Infusion Start Time: 1404  Infusion Stop Time: 1542  Post Infusion IV Care: Patient declined observation and Port a Cath Deaccessed/Flushed  Discharge: Condition: Good, Destination: Home . AVS Declined  Performed by:  Elinor Kleine, RN

## 2024-05-13 ENCOUNTER — Inpatient Hospital Stay: Payer: Self-pay | Attending: Oncology

## 2024-05-13 VITALS — BP 141/88 | HR 90 | Resp 18

## 2024-05-13 DIAGNOSIS — Z5111 Encounter for antineoplastic chemotherapy: Secondary | ICD-10-CM | POA: Diagnosis not present

## 2024-05-13 DIAGNOSIS — C50412 Malignant neoplasm of upper-outer quadrant of left female breast: Secondary | ICD-10-CM | POA: Insufficient documentation

## 2024-05-13 DIAGNOSIS — D509 Iron deficiency anemia, unspecified: Secondary | ICD-10-CM

## 2024-05-13 MED ORDER — FULVESTRANT 250 MG/5ML IM SOSY
500.0000 mg | PREFILLED_SYRINGE | Freq: Once | INTRAMUSCULAR | Status: AC
Start: 2024-05-13 — End: 2024-05-13
  Administered 2024-05-13: 500 mg via INTRAMUSCULAR
  Filled 2024-05-13: qty 10

## 2024-05-14 ENCOUNTER — Ambulatory Visit: Admitting: *Deleted

## 2024-05-14 VITALS — BP 124/84 | HR 85 | Temp 98.2°F | Resp 20 | Ht 64.0 in | Wt 285.0 lb

## 2024-05-14 DIAGNOSIS — D509 Iron deficiency anemia, unspecified: Secondary | ICD-10-CM | POA: Diagnosis not present

## 2024-05-14 MED ORDER — HEPARIN SOD (PORK) LOCK FLUSH 100 UNIT/ML IV SOLN
500.0000 [IU] | Freq: Once | INTRAVENOUS | Status: AC | PRN
  Administered 2024-05-14: 500 [IU]
  Filled 2024-05-14: qty 5

## 2024-05-14 MED ORDER — SODIUM CHLORIDE 0.9 % IV SOLN
300.0000 mg | Freq: Once | INTRAVENOUS | Status: AC
  Administered 2024-05-14: 300 mg via INTRAVENOUS
  Filled 2024-05-14: qty 15

## 2024-05-14 NOTE — Progress Notes (Signed)
 Diagnosis: Iron  Deficiency Anemia  Provider:  Lonna Coder MD  Procedure: IV Infusion  IV Type: Port a Cath, IV Location: R Chest  Venofer  (Iron  Sucrose), Dose: 300 mg  Infusion Start Time: 1339  Infusion Stop Time: 1522  Post Infusion IV Care: Port a Cath Deaccessed/Flushed  Discharge: Condition: Good, Destination: Home . AVS Declined  Performed by:  Eleanor DELENA Bloch, RN

## 2024-05-21 ENCOUNTER — Ambulatory Visit

## 2024-05-21 VITALS — BP 161/73 | HR 94 | Temp 97.9°F | Resp 16 | Ht 69.0 in | Wt 285.6 lb

## 2024-05-21 DIAGNOSIS — D509 Iron deficiency anemia, unspecified: Secondary | ICD-10-CM | POA: Diagnosis not present

## 2024-05-21 MED ORDER — HEPARIN SOD (PORK) LOCK FLUSH 100 UNIT/ML IV SOLN
500.0000 [IU] | Freq: Once | INTRAVENOUS | Status: AC | PRN
  Administered 2024-05-21: 500 [IU]

## 2024-05-21 MED ORDER — SODIUM CHLORIDE 0.9 % IV SOLN
300.0000 mg | Freq: Once | INTRAVENOUS | Status: AC
Start: 2024-05-21 — End: 2024-05-21
  Administered 2024-05-21: 300 mg via INTRAVENOUS
  Filled 2024-05-21: qty 15

## 2024-05-21 NOTE — Progress Notes (Signed)
 Diagnosis: Iron  Deficiency Anemia  Provider:  Mannam, Praveen MD  Procedure: IV Infusion  IV Type: Port a Cath, IV Location: R Chest  Venofer  (Iron  Sucrose), Dose: 300 mg  Infusion Start Time: 1416  Infusion Stop Time: 1559  Post Infusion IV Care: Patient declined observation and Port a Cath Deaccessed/Flushed and heparin  locked  Discharge: Condition: Good, Destination: Home . AVS Declined  Performed by:  Rocky FORBES Sar, RN

## 2024-06-02 ENCOUNTER — Ambulatory Visit: Admitting: Family Medicine

## 2024-06-02 NOTE — Progress Notes (Deleted)
    SUBJECTIVE:   CHIEF COMPLAINT / HPI:   ***   BB is a 53yo F that pf    PERTINENT  PMH / PSH: hx of recurrent L breast cancer s/p L mastectomy and now on chemo, obesity, chronic knee pain, IDA   OBJECTIVE:   LMP 07/13/2021 (Approximate)   ***  ASSESSMENT/PLAN:   Assessment & Plan      Twyla Nearing, MD [facility]

## 2024-06-08 ENCOUNTER — Ambulatory Visit: Admitting: Family Medicine

## 2024-06-08 ENCOUNTER — Ambulatory Visit (INDEPENDENT_AMBULATORY_CARE_PROVIDER_SITE_OTHER)

## 2024-06-08 VITALS — BP 133/88 | HR 89 | Ht 69.0 in | Wt 290.0 lb

## 2024-06-08 DIAGNOSIS — F319 Bipolar disorder, unspecified: Secondary | ICD-10-CM

## 2024-06-08 DIAGNOSIS — R413 Other amnesia: Secondary | ICD-10-CM | POA: Diagnosis not present

## 2024-06-08 DIAGNOSIS — E7800 Pure hypercholesterolemia, unspecified: Secondary | ICD-10-CM | POA: Diagnosis not present

## 2024-06-08 DIAGNOSIS — F4310 Post-traumatic stress disorder, unspecified: Secondary | ICD-10-CM

## 2024-06-08 DIAGNOSIS — E039 Hypothyroidism, unspecified: Secondary | ICD-10-CM

## 2024-06-08 DIAGNOSIS — D508 Other iron deficiency anemias: Secondary | ICD-10-CM

## 2024-06-08 DIAGNOSIS — J449 Chronic obstructive pulmonary disease, unspecified: Secondary | ICD-10-CM

## 2024-06-08 MED ORDER — FERROUS SULFATE 324 MG PO TBEC: 324.0000 mg | DELAYED_RELEASE_TABLET | Freq: Every day | ORAL | 3 refills | Status: AC

## 2024-06-08 MED ORDER — LEVOTHYROXINE SODIUM 75 MCG PO TABS
75.0000 ug | ORAL_TABLET | ORAL | 1 refills | Status: DC
Start: 2024-06-08 — End: 2024-09-07

## 2024-06-08 MED ORDER — SPIRIVA RESPIMAT 1.25 MCG/ACT IN AERS: 2.0000 | INHALATION_SPRAY | Freq: Every day | RESPIRATORY_TRACT | 0 refills | Status: DC

## 2024-06-08 MED ORDER — ROSUVASTATIN CALCIUM 10 MG PO TABS: 10.0000 mg | ORAL_TABLET | Freq: Every day | ORAL | 1 refills | Status: AC

## 2024-06-08 MED ORDER — PRAZOSIN HCL 1 MG PO CAPS: 1.0000 mg | ORAL_CAPSULE | Freq: Every day | ORAL | 1 refills | Status: AC

## 2024-06-08 MED ORDER — FLUTICASONE-UMECLIDIN-VILANT 100-62.5-25 MCG/ACT IN AEPB: 1.0000 | INHALATION_SPRAY | Freq: Every day | RESPIRATORY_TRACT | Status: DC

## 2024-06-08 NOTE — Patient Instructions (Addendum)
 Thank you for visiting the clinic today, it was good to see you!  Please always bring your medication bottles  In today's visit we discussed:  Memory: We have ordered several labs and will follow up with you on those at your next visit this Thursday at 8:45 AM for your knee injection. I have also put in a referral for you to see the special clinic for memory issues where they can further assess what is going on.  Depression: I have also put in a referral for someone to reach out to you about therapy options.  Low thyroid : Take you synthroid  75 mg every day in the morning 2 hours before you eat any food. This can also help with your memory  COPD: Your spiriva  was not covered by insurance, so we gave you a sample of Trelegy, take this every morning regardless of symptoms. Use the albuterol  as needed for worsening symptoms.  Med refills: I have also sent other medications for refill, I would recommend a pill organize to keep them all straight.  Please follow-up in 4 weeks  For any questions, please call the office at [phone] or send me a message in MyChart. Have a great day!  -Fairy Amy, MD  [facility] Resident, PGY-1

## 2024-06-08 NOTE — Progress Notes (Deleted)
    SUBJECTIVE:   CHIEF COMPLAINT / HPI:   ***    Alcohol, drugs? TSH, depression  - mini-cog      PERTINENT  PMH / PSH: ***  OBJECTIVE:   LMP 07/13/2021 (Approximate)   ***  ASSESSMENT/PLAN:   Assessment & Plan      Twyla Nearing, MD [facility]

## 2024-06-08 NOTE — Assessment & Plan Note (Signed)
 Uncontrolled, prescribed Synthroid  but never filled.  Goiter on exam -Restart Levothyroxine  75 mcg po qam

## 2024-06-08 NOTE — Progress Notes (Signed)
    SUBJECTIVE:   CHIEF COMPLAINT / HPI:   Memory: First noticed two years ago when she would forget about appointments and special family events. Has tried to put reminders and notifications in her phone but she would still miss. She had chemotherapy for three years 14 years ago and had an allergic reaction to the IV medication so she d/cd them, recurrent met to Breast s/p mastectomy. No confusion or disorientation in a familiar place, but does run errands and forgets to get things or leaves things out. She reports that she has been in denial about her memory issues for years now.   No alcohol use, smoking 5-7 cigarettes a day, no other substances. LMP was at 53 years old but reports frequent, intense hot flashes. Last TSH was 19 08/23/2023, free T4 10 months ago was 0.68. Never started her synthroid . No hoarseness, dysphagia, or pain  Depression/anxiety: Doesn't feel like her depression is worse than it has been in the past. PHQ-9 was 19, with 8/9 positive, no SI/HI. Reports that she loves surrounding herself with babies because they only want love. Still feels fulfillment with that.  Has benefited from therapy in the past, but has been discontinued because of missing appointments  COPD: Reports getting totally out of breath when walking <100 ft or going up stairs. Has only been using her albuterol  inhaler as needed, never filled her spiriva .  Wants a knee injection done on R knee.  PERTINENT  PMH / PSH: Recurrent breast cancer s/p chemo and radiation  OBJECTIVE:   BP 133/88   Pulse 89   Ht 5' 9 (1.753 m)   Wt 290 lb (131.5 kg)   LMP 07/13/2021 (Approximate)   SpO2 98%   BMI 42.83 kg/m    Cardiac: Regular rate and rhythm. Normal S1/S2. No murmurs, rubs, or gallops appreciated. Lungs: Clear bilaterally to auscultation, diminished bilaterally.  Neck: Goiter, no nodules, not hot or tender Abdomen: Normoactive bowel sounds. No tenderness to deep or light palpation. No rebound or  guarding.    Neuro: Mini cog 3 out of 5, missed 2 points for vocabulary recall see attached media.  Forgetful throughout clinic visit, restating things 2 times. Psych: Tearful and somber, but reactive. No SI/HI, but anger and frustration with memory issues and clinic issues in the past.       06/08/2024    2:59 PM 08/23/2023    4:04 PM 07/11/2023    2:21 PM  PHQ9 SCORE ONLY  PHQ-9 Total Score 19 14 15      ASSESSMENT/PLAN:   Assessment & Plan Memory changes Chronic issue started after chemotherapy, likely iatrogenic but most likely multifactorial.  Affecting many facets of her life and daily function.  Mini cog was 3 out of 5 missed two for vocabulary recall.  Could be metabolic related given hypothyroidism without treatment and pending labs.  Could also be secondary to depression, or could be hereditary early onset. -B12, RPR, HIV, TSH with T4 reflex ordered -VBCI referral for stress and depression -Geriatric referral to further evaluate cognition -F/u Thursday for knee pain and can reevaluate then Hypothyroidism, unspecified type Uncontrolled, prescribed Synthroid  but never filled.  Goiter on exam -Restart Levothyroxine  75 mcg po qam Bipolar depression (HCC) severe depression today with PHQ-9 of 19.  Previously benefited from therapy -VBCI referral     Fairy Amy, MD [facility]

## 2024-06-08 NOTE — Assessment & Plan Note (Signed)
 severe depression today with PHQ-9 of 19.  Previously benefited from therapy -VBCI referral

## 2024-06-09 DIAGNOSIS — R413 Other amnesia: Secondary | ICD-10-CM | POA: Diagnosis not present

## 2024-06-09 DIAGNOSIS — E039 Hypothyroidism, unspecified: Secondary | ICD-10-CM | POA: Diagnosis not present

## 2024-06-10 ENCOUNTER — Inpatient Hospital Stay: Payer: Self-pay | Attending: Oncology

## 2024-06-10 ENCOUNTER — Ambulatory Visit: Payer: Self-pay

## 2024-06-10 ENCOUNTER — Other Ambulatory Visit (HOSPITAL_COMMUNITY): Payer: Self-pay

## 2024-06-10 ENCOUNTER — Encounter: Payer: Self-pay | Admitting: Adult Health

## 2024-06-10 VITALS — BP 146/92 | HR 92 | Temp 98.2°F | Resp 18

## 2024-06-10 DIAGNOSIS — Z5111 Encounter for antineoplastic chemotherapy: Secondary | ICD-10-CM | POA: Insufficient documentation

## 2024-06-10 DIAGNOSIS — C50412 Malignant neoplasm of upper-outer quadrant of left female breast: Secondary | ICD-10-CM | POA: Insufficient documentation

## 2024-06-10 DIAGNOSIS — E538 Deficiency of other specified B group vitamins: Secondary | ICD-10-CM

## 2024-06-10 DIAGNOSIS — D509 Iron deficiency anemia, unspecified: Secondary | ICD-10-CM

## 2024-06-10 LAB — HIV ANTIBODY (ROUTINE TESTING W REFLEX): HIV Screen 4th Generation wRfx: NONREACTIVE

## 2024-06-10 LAB — RPR: RPR Ser Ql: NONREACTIVE

## 2024-06-10 LAB — VITAMIN B12: Vitamin B-12: 200 pg/mL — ABNORMAL LOW (ref 232–1245)

## 2024-06-10 LAB — TSH RFX ON ABNORMAL TO FREE T4: TSH: 19.5 u[IU]/mL — ABNORMAL HIGH (ref 0.450–4.500)

## 2024-06-10 LAB — T4F: T4,Free (Direct): 0.62 ng/dL — ABNORMAL LOW (ref 0.82–1.77)

## 2024-06-10 MED ORDER — VITAMIN B-12 1000 MCG PO TABS: 1000.0000 ug | ORAL_TABLET | Freq: Every day | ORAL | 1 refills | Status: DC

## 2024-06-10 MED ORDER — FULVESTRANT 250 MG/5ML IM SOSY
500.0000 mg | PREFILLED_SYRINGE | Freq: Once | INTRAMUSCULAR | Status: AC
  Administered 2024-06-10: 500 mg via INTRAMUSCULAR
  Filled 2024-06-10: qty 10

## 2024-06-11 ENCOUNTER — Ambulatory Visit (INDEPENDENT_AMBULATORY_CARE_PROVIDER_SITE_OTHER): Payer: Self-pay

## 2024-06-11 VITALS — BP 135/96 | HR 102 | Ht 70.0 in | Wt 294.6 lb

## 2024-06-11 DIAGNOSIS — M25561 Pain in right knee: Secondary | ICD-10-CM

## 2024-06-11 DIAGNOSIS — J449 Chronic obstructive pulmonary disease, unspecified: Secondary | ICD-10-CM | POA: Diagnosis not present

## 2024-06-11 DIAGNOSIS — M25562 Pain in left knee: Secondary | ICD-10-CM | POA: Diagnosis not present

## 2024-06-11 DIAGNOSIS — G8929 Other chronic pain: Secondary | ICD-10-CM | POA: Diagnosis not present

## 2024-06-11 MED ORDER — SPIRIVA RESPIMAT 2.5 MCG/ACT IN AERS
2.0000 | INHALATION_SPRAY | Freq: Every day | RESPIRATORY_TRACT | 2 refills | Status: AC
Start: 2024-06-11 — End: ?

## 2024-06-11 MED ORDER — METHYLPREDNISOLONE ACETATE 80 MG/ML IJ SUSP
80.0000 mg | Freq: Once | INTRAMUSCULAR | Status: AC
  Administered 2024-06-11: 80 mg via INTRAMUSCULAR
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
## 2024-06-11 NOTE — Assessment & Plan Note (Signed)
 Chronic, waxing waning presentation in the left knee.  Has had 1 injection in left knee, 3 in right knee. -Left knee x-ray 4 view ordered

## 2024-06-11 NOTE — Patient Instructions (Signed)
 Thank you for visiting the clinic today, it was good to see you!  Please always bring your medication bottles  In today's visit we discussed:  Knee pain: given the history of pain in both of your knees, I have ordered x-rays to see what changes have occurred there. Today we gave you a steroid injection of your right knee, your pain should start to improve within the day, as the lidocaine  wears off you may have some pain return, but the steroid should start to treat the pain in about 5-7 days. If the join gets red, swollen, more tender, or you get a fever then you should contact the clinic.  COPD: I have refilled your Spiriva , please go to the pharmacy to pick it up.  Please follow-up in 3 months  For any questions, please call the office at [phone] or send me a message in MyChart. Have a great day!  -Fairy Amy, MD  [facility] Resident, PGY-1

## 2024-06-11 NOTE — Assessment & Plan Note (Signed)
 Controlled, was previously not taking Spiriva  as prescribed.  Last PFT done 03/27/2017, may need updated PFT. -Filled Spiriva  2 puffs once a day

## 2024-06-11 NOTE — Assessment & Plan Note (Addendum)
 Chronic, worsening last 6 months. No imaging history, but 3 previous injections with benefit lasting 6 months.  -Right knee depomedrol injection performed today -Right knee x-ray 4 view ordered

## 2024-06-12 ENCOUNTER — Telehealth: Payer: Self-pay | Admitting: *Deleted

## 2024-06-12 NOTE — Progress Notes (Signed)
 Complex Care Management Note  Care Guide Note 06/12/2024 Name: Nishika Parkhurst MRN: [id number] DOB: December 29, 1970  Kimberly [name] is a 53 y.o. year old female who sees Elicia Hamlet, MD for primary care. I reached out to [employer] by phone today to offer complex care management services.  Ms. Nessel was given information about Complex Care Management services today including:   The Complex Care Management services include support from the care team which includes your Nurse Care Manager, Clinical Social Worker, or Pharmacist.  The Complex Care Management team is here to help remove barriers to the health concerns and goals most important to you. Complex Care Management services are voluntary, and the patient may decline or stop services at any time by request to their care team member.   Complex Care Management Consent Status: Patient agreed to services and verbal consent obtained.   Follow up plan:  Telephone appointment with complex care management team member scheduled for:  9/10  Encounter Outcome:  Patient Scheduled  Harlene Satterfield  [facility]  [facility], [facility] Guide  Direct Dial: [phone]  Fax [phone]

## 2024-06-15 ENCOUNTER — Ambulatory Visit: Payer: Self-pay | Admitting: Surgery

## 2024-06-15 DIAGNOSIS — Z720 Tobacco use: Secondary | ICD-10-CM | POA: Diagnosis not present

## 2024-06-15 DIAGNOSIS — K643 Fourth degree hemorrhoids: Secondary | ICD-10-CM | POA: Diagnosis not present

## 2024-06-15 DIAGNOSIS — D500 Iron deficiency anemia secondary to blood loss (chronic): Secondary | ICD-10-CM | POA: Diagnosis not present

## 2024-07-01 ENCOUNTER — Other Ambulatory Visit: Payer: Self-pay | Admitting: Licensed Clinical Social Worker

## 2024-07-08 ENCOUNTER — Inpatient Hospital Stay: Payer: Self-pay

## 2024-07-09 DIAGNOSIS — R52 Pain, unspecified: Secondary | ICD-10-CM | POA: Diagnosis not present

## 2024-07-09 DIAGNOSIS — Z743 Need for continuous supervision: Secondary | ICD-10-CM | POA: Diagnosis not present

## 2024-07-09 DIAGNOSIS — S1096XA Insect bite of unspecified part of neck, initial encounter: Secondary | ICD-10-CM | POA: Diagnosis not present

## 2024-07-09 DIAGNOSIS — F17210 Nicotine dependence, cigarettes, uncomplicated: Secondary | ICD-10-CM | POA: Diagnosis not present

## 2024-07-10 ENCOUNTER — Telehealth: Admitting: Family Medicine

## 2024-07-10 NOTE — Progress Notes (Signed)
 Pt did not show for visit [facility]

## 2024-07-13 ENCOUNTER — Other Ambulatory Visit: Payer: Self-pay | Admitting: Licensed Clinical Social Worker

## 2024-07-14 DIAGNOSIS — F315 Bipolar disorder, current episode depressed, severe, with psychotic features: Secondary | ICD-10-CM | POA: Diagnosis not present

## 2024-08-05 ENCOUNTER — Inpatient Hospital Stay: Payer: Self-pay | Attending: Oncology

## 2024-08-05 VITALS — BP 129/89 | HR 100 | Temp 98.3°F | Resp 18

## 2024-08-05 DIAGNOSIS — Z5111 Encounter for antineoplastic chemotherapy: Secondary | ICD-10-CM | POA: Insufficient documentation

## 2024-08-05 DIAGNOSIS — C50412 Malignant neoplasm of upper-outer quadrant of left female breast: Secondary | ICD-10-CM | POA: Diagnosis not present

## 2024-08-05 DIAGNOSIS — D509 Iron deficiency anemia, unspecified: Secondary | ICD-10-CM

## 2024-08-05 MED ORDER — FULVESTRANT 250 MG/5ML IM SOSY
500.0000 mg | PREFILLED_SYRINGE | Freq: Once | INTRAMUSCULAR | Status: AC
  Administered 2024-08-05: 500 mg via INTRAMUSCULAR
  Filled 2024-08-05: qty 10

## 2024-08-06 ENCOUNTER — Other Ambulatory Visit: Payer: Self-pay | Admitting: Surgery

## 2024-08-06 DIAGNOSIS — K643 Fourth degree hemorrhoids: Secondary | ICD-10-CM | POA: Diagnosis not present

## 2024-08-06 DIAGNOSIS — K644 Residual hemorrhoidal skin tags: Secondary | ICD-10-CM | POA: Diagnosis not present

## 2024-08-07 LAB — SURGICAL PATHOLOGY

## 2024-08-28 ENCOUNTER — Telehealth: Admitting: Physician Assistant

## 2024-08-28 DIAGNOSIS — J208 Acute bronchitis due to other specified organisms: Secondary | ICD-10-CM

## 2024-08-28 DIAGNOSIS — B9689 Other specified bacterial agents as the cause of diseases classified elsewhere: Secondary | ICD-10-CM

## 2024-08-28 MED ORDER — PROMETHAZINE-DM 6.25-15 MG/5ML PO SYRP: 5.0000 mL | ORAL_SOLUTION | Freq: Four times a day (QID) | ORAL | 0 refills | Status: DC | PRN

## 2024-08-28 MED ORDER — AMOXICILLIN-POT CLAVULANATE 875-125 MG PO TABS: 1.0000 | ORAL_TABLET | Freq: Two times a day (BID) | ORAL | 0 refills | Status: DC

## 2024-08-28 MED ORDER — PREDNISONE 20 MG PO TABS: 40.0000 mg | ORAL_TABLET | Freq: Every day | ORAL | 0 refills | Status: DC

## 2024-08-28 NOTE — Progress Notes (Signed)
 Virtual Visit Consent   Kimberly [name], you are scheduled for a virtual visit with a [place] provider today. Just as with appointments in the office, your consent must be obtained to participate. Your consent will be active for this visit and any virtual visit you may have with one of our providers in the next 365 days. If you have a MyChart account, a copy of this consent can be sent to you electronically.  As this is a virtual visit, video technology does not allow for your provider to perform a traditional examination. This may limit your provider's ability to fully assess your condition. If your provider identifies any concerns that need to be evaluated in person or the need to arrange testing (such as labs, EKG, etc.), we will make arrangements to do so. Although advances in technology are sophisticated, we cannot ensure that it will always work on either your end or our end. If the connection with a video visit is poor, the visit may have to be switched to a telephone visit. With either a video or telephone visit, we are not always able to ensure that we have a secure connection.  By engaging in this virtual visit, you consent to the provision of healthcare and authorize for your insurance to be billed (if applicable) for the services provided during this visit. Depending on your insurance coverage, you may receive a charge related to this service.  I need to obtain your verbal consent now. Are you willing to proceed with your visit today? Kimberly [name] has provided verbal consent on 08/28/2024 for a virtual visit (video or telephone). Kimberly CHRISTELLA Dickinson, [name]  Date: 08/28/2024 5:22 PM   Virtual Visit via Video Note   I, Kimberly [name], connected with  Kimberly [name]  ([id number], 03-02-1971) on 08/28/24 at  5:15 PM EST by a video-enabled telemedicine application and verified that I am speaking with the correct person using two identifiers.  Location: Patient: Virtual Visit  Location Patient: Home Provider: Virtual Visit Location Provider: Home Office   I discussed the limitations of evaluation and management by telemedicine and the availability of in person appointments. The patient expressed understanding and agreed to proceed.    History of Present Illness: Kimberly [name] is a 53 y.o. who identifies as a female who was assigned female at birth, and is being seen today for cough and congestion.  HPI: URI  This is a new problem. The current episode started 1 to 4 weeks ago (started 08/21/24). The problem has been gradually worsening. Maximum temperature: subjective fever. Associated symptoms include chest pain (from cough), congestion, coughing, headaches, a plugged ear sensation, rhinorrhea (and post nasal drainage), sinus pain and wheezing. Pertinent negatives include no diarrhea, ear pain, nausea, sneezing, sore throat or vomiting. Associated symptoms comments: Hot and cold flashes, fatigue. She has tried inhaler use (anoro, Spiriva , mucinex , dimetap, nyquil, cold and flu multi-symptom, tylenol , and ibuprofen ) for the symptoms. The treatment provided mild relief.     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   History of breast cancer 02/11/2020  Total body pain 01/13/2019   Postmenopausal bleeding 10/25/2017   Chronic pain of both knees 05/03/2017   COPD (chronic obstructive pulmonary disease) (HCC) 03/20/2017   Tachycardia    Mixed incontinence 09/29/2015   Genetic testing 08/26/2015   Recurrent breast cancer, left (HCC)  04/14/2014   Disorder of bladder 09/29/2008   LOW BACK PAIN, CHRONIC 09/29/2008   Morbid obesity (HCC) 11/19/2007   IDA (iron  deficiency anemia) 11/14/2007   Mixed bipolar I disorder (HCC) 02/20/2007   Tobacco abuse 02/20/2007    Allergies: No Known Allergies Medications:  Current Outpatient Medications:    amoxicillin -clavulanate (AUGMENTIN ) 875-125 MG tablet, Take 1 tablet by mouth 2 (two) times daily., Disp: 20 tablet, Rfl: 0   predniSONE  (DELTASONE ) 20 MG tablet, Take 2 tablets (40 mg total) by mouth daily with breakfast., Disp: 10 tablet, Rfl: 0   promethazine -dextromethorphan  (PROMETHAZINE -DM) 6.25-15 MG/5ML syrup, Take 5 mLs by mouth 4 (four) times daily as needed., Disp: 118 mL, Rfl: 0   albuterol  (VENTOLIN  HFA) 108 (90 Base) MCG/ACT inhaler, Inhale 2 puffs into the lungs every 6 (six) hours as needed for wheezing or shortness of breath., Disp: 8 g, Rfl: 2   busPIRone  (BUSPAR ) 15 MG tablet, Take 15 mg by mouth 3 (three) times daily., Disp: , Rfl:    cyanocobalamin  (VITAMIN B12) 1000 MCG tablet, Take 1 tablet (1,000 mcg total) by mouth daily., Disp: 30 tablet, Rfl: 1   doxepin  (SINEQUAN ) 75 MG capsule, Take 75 mg by mouth at bedtime., Disp: , Rfl:    ferrous sulfate  324 MG TBEC, Take 1 tablet (324 mg total) by mouth daily., Disp: 90 tablet, Rfl: 3   hydrocortisone  (ANUSOL -HC) 25 MG suppository, Use every other night for 2-weeks until prescription is complete, Disp: 7 suppository, Rfl: 0   hydrocortisone  (ANUSOL -HC) 25 MG suppository, Place 1 suppository (25 mg total) rectally 2 (two) times daily., Disp: 6 suppository, Rfl: 0   levothyroxine  (SYNTHROID ) 75 MCG tablet, Take 1 tablet (75 mcg total) by mouth every morning. 30 minutes before food, Disp: 30 tablet, Rfl: 1   lidocaine -prilocaine  (EMLA ) cream, Apply 1 application topically as needed., Disp: 30 g, Rfl: 0   montelukast  (SINGULAIR ) 10 MG tablet, Take 1 tablet (10 mg total) by mouth at bedtime., Disp: 30 tablet, Rfl: 3   prazosin   (MINIPRESS ) 1 MG capsule, Take 1 capsule (1 mg total) by mouth at bedtime., Disp: 90 capsule, Rfl: 1   prochlorperazine  (COMPAZINE ) 5 MG tablet, TAKE 1 TABLET(5 MG) BY MOUTH EVERY 6 HOURS AS NEEDED FOR VOMITING OR NAUSEA (Patient not taking: Reported on 06/08/2024), Disp: 30 tablet, Rfl: 1   QUEtiapine  (SEROQUEL ) 400 MG tablet, Take 400 mg by mouth at bedtime. , Disp: , Rfl:    rosuvastatin  (CRESTOR ) 10 MG tablet, Take 1 tablet (10 mg total) by mouth daily., Disp: 90 tablet, Rfl: 1   Tiotropium Bromide  Monohydrate (SPIRIVA  RESPIMAT) 2.5 MCG/ACT AERS, Inhale 2 puffs into the lungs daily., Disp: 4 g, Rfl: 2  Observations/Objective: Patient is well-developed, well-nourished in no acute distress.  Resting comfortably at home.  Head is normocephalic, atraumatic.  No labored breathing.  Speech is clear and coherent with logical content.  Patient is alert and oriented at baseline.  Frequent, dry, hacking cough heard  Assessment and Plan: 1. Acute bacterial bronchitis (Primary) - amoxicillin -clavulanate (AUGMENTIN ) 875-125 MG tablet; Take 1 tablet by mouth 2 (two) times daily.  Dispense: 20 tablet; Refill: 0 - predniSONE  (DELTASONE ) 20 MG tablet; Take 2 tablets (40 mg total) by mouth  daily with breakfast.  Dispense: 10 tablet; Refill: 0 - promethazine -dextromethorphan  (PROMETHAZINE -DM) 6.25-15 MG/5ML syrup; Take 5 mLs by mouth 4 (four) times daily as needed.  Dispense: 118 mL; Refill: 0  - Worsening over a week despite OTC medications - Will treat with Augmentin  - Add Prednisone  - Add Promethazine  DM for cough - Can continue Mucinex  (PLAIN) during the daytime - Continue inhalers as prescribed - Push fluids.  - Rest.  - Steam and humidifier can help - Seek in person evaluation if worsening or symptoms fail to improve    Follow Up Instructions: I discussed the assessment and treatment plan with the patient. The patient was provided an opportunity to ask questions and all were answered. The  patient agreed with the plan and demonstrated an understanding of the instructions.  A copy of instructions were sent to the patient via MyChart unless otherwise noted below.    The patient was advised to call back or seek an in-person evaluation if the symptoms worsen or if the condition fails to improve as anticipated.    Kimberly CHRISTELLA Dickinson, [name]

## 2024-08-28 NOTE — Patient Instructions (Signed)
 Kimberly [name], thank you for joining [name], [name] for today's virtual visit.  While this provider is not your primary care provider (PCP), if your PCP is located in our provider database this encounter information will be shared with them immediately following your visit.   A [facility] MyChart account gives you access to today's visit and all your visits, tests, and labs performed at [facility]  click here if you don't have a [facility] MyChart account or go to [email or web address]  Consent: (Patient) Kimberly [name] provided verbal consent for this virtual visit at the beginning of the encounter.  Current Medications:  Current Outpatient Medications:    amoxicillin -clavulanate (AUGMENTIN ) 875-125 MG tablet, Take 1 tablet by mouth 2 (two) times daily., Disp: 20 tablet, Rfl: 0   predniSONE  (DELTASONE ) 20 MG tablet, Take 2 tablets (40 mg total) by mouth daily with breakfast., Disp: 10 tablet, Rfl: 0   promethazine -dextromethorphan  (PROMETHAZINE -DM) 6.25-15 MG/5ML syrup, Take 5 mLs by mouth 4 (four) times daily as needed., Disp: 118 mL, Rfl: 0   albuterol  (VENTOLIN  HFA) 108 (90 Base) MCG/ACT inhaler, Inhale 2 puffs into the lungs every 6 (six) hours as needed for wheezing or shortness of breath., Disp: 8 g, Rfl: 2   busPIRone  (BUSPAR ) 15 MG tablet, Take 15 mg by mouth 3 (three) times daily., Disp: , Rfl:    cyanocobalamin  (VITAMIN B12) 1000 MCG tablet, Take 1 tablet (1,000 mcg total) by mouth daily., Disp: 30 tablet, Rfl: 1   doxepin  (SINEQUAN ) 75 MG capsule, Take 75 mg by mouth at bedtime., Disp: , Rfl:    ferrous sulfate  324 MG TBEC, Take 1 tablet (324 mg total) by mouth daily., Disp: 90 tablet, Rfl: 3   hydrocortisone  (ANUSOL -HC) 25 MG suppository, Use every other night for 2-weeks until prescription is complete, Disp: 7 suppository, Rfl: 0   hydrocortisone  (ANUSOL -HC) 25 MG suppository, Place 1 suppository (25 mg total) rectally 2 (two) times daily.,  Disp: 6 suppository, Rfl: 0   levothyroxine  (SYNTHROID ) 75 MCG tablet, Take 1 tablet (75 mcg total) by mouth every morning. 30 minutes before food, Disp: 30 tablet, Rfl: 1   lidocaine -prilocaine  (EMLA ) cream, Apply 1 application topically as needed., Disp: 30 g, Rfl: 0   montelukast  (SINGULAIR ) 10 MG tablet, Take 1 tablet (10 mg total) by mouth at bedtime., Disp: 30 tablet, Rfl: 3   prazosin  (MINIPRESS ) 1 MG capsule, Take 1 capsule (1 mg total) by mouth at bedtime., Disp: 90 capsule, Rfl: 1   prochlorperazine  (COMPAZINE ) 5 MG tablet, TAKE 1 TABLET(5 MG) BY MOUTH EVERY 6 HOURS AS NEEDED FOR VOMITING OR NAUSEA (Patient not taking: Reported on 06/08/2024), Disp: 30 tablet, Rfl: 1   QUEtiapine  (SEROQUEL ) 400 MG tablet, Take 400 mg by mouth at bedtime. , Disp: , Rfl:    rosuvastatin  (CRESTOR ) 10 MG tablet, Take 1 tablet (10 mg total) by mouth daily., Disp: 90 tablet, Rfl: 1   Tiotropium Bromide  Monohydrate (SPIRIVA  RESPIMAT) 2.5 MCG/ACT AERS, Inhale 2 puffs into the lungs daily., Disp: 4 g, Rfl: 2   Medications ordered in this encounter:  Meds ordered this encounter  Medications   amoxicillin -clavulanate (AUGMENTIN ) 875-125 MG tablet    Sig: Take 1 tablet by mouth 2 (two) times daily.    Dispense:  20 tablet    Refill:  0    Supervising Provider:   LAMPTEY, PHILIP O [id number]   predniSONE  (DELTASONE ) 20 MG tablet    Sig: Take 2 tablets (40 mg total) by mouth daily with breakfast.  Dispense:  10 tablet    Refill:  0    Supervising Provider:   LAMPTEY, PHILIP O [[id number]]   promethazine -dextromethorphan  (PROMETHAZINE -DM) 6.25-15 MG/5ML syrup    Sig: Take 5 mLs by mouth 4 (four) times daily as needed.    Dispense:  118 mL    Refill:  0    Supervising Provider:   BLAISE ALEENE KIDD [[id number]]     *If you need refills on other medications prior to your next appointment, please contact your pharmacy*  Follow-Up: Call back or seek an in-person evaluation if the symptoms worsen or if the condition  fails to improve as anticipated.  [facility] [phone]  Other Instructions Acute Bronchitis, Adult  Acute bronchitis is sudden inflammation of the main airways (bronchi) that come off the windpipe (trachea) in the lungs. The swelling causes the airways to get smaller and make more mucus than normal. This can make it hard to breathe and can cause coughing or noisy breathing (wheezing). Acute bronchitis may last several weeks. The cough may last longer. Allergies, asthma, and exposure to smoke may make the condition worse. What are the causes? This condition can be caused by germs and by substances that irritate the lungs, including: Cold and flu viruses. The most common cause of this condition is the virus that causes the common cold. Bacteria. This is less common. Breathing in substances that irritate the lungs, including: Smoke from cigarettes and other forms of tobacco. Dust and pollen. Fumes from household cleaning products, gases, or burned fuel. Indoor or outdoor air pollution. What increases the risk? The following factors may make you more likely to develop this condition: A weak body's defense system, also called the immune system. A condition that affects your lungs and breathing, such as asthma. What are the signs or symptoms? Common symptoms of this condition include: Coughing. This may bring up clear, yellow, or green mucus from your lungs (sputum). Wheezing. Runny or stuffy nose. Having too much mucus in your lungs (chest congestion). Shortness of breath. Aches and pains, including sore throat or chest. How is this diagnosed? This condition is usually diagnosed based on: Your symptoms and medical history. A physical exam. You may also have other tests, including tests to rule out other conditions, such as pneumonia. These tests include: A test of lung function. Test of a mucus sample to look for the presence of bacteria. Tests to check the oxygen   level in your blood. Blood tests. Chest X-ray. How is this treated? Most cases of acute bronchitis clear up over time without treatment. Your health care provider may recommend: Drinking more fluids to help thin your mucus so it is easier to cough up. Taking inhaled medicine (inhaler) to improve air flow in and out of your lungs. Using a vaporizer or a humidifier. These are machines that add water to the air to help you breathe better. Taking a medicine that thins mucus and clears congestion (expectorant). Taking a medicine that prevents or stops coughing (cough suppressant). It is not common to take an antibiotic medicine for this condition. Follow these instructions at home:  Take over-the-counter and prescription medicines only as told by your health care provider. Use an inhaler, vaporizer, or humidifier as told by your health care provider. Take two teaspoons (10 mL) of honey at bedtime to lessen coughing at night. Drink enough fluid to keep your urine pale yellow. Do not use any products that contain nicotine or tobacco. These products include cigarettes, chewing tobacco,  and vaping devices, such as e-cigarettes. If you need help quitting, ask your health care provider. Get plenty of rest. Return to your normal activities as told by your health care provider. Ask your health care provider what activities are safe for you. Keep all follow-up visits. This is important. How is this prevented? To lower your risk of getting this condition again: Wash your hands often with soap and water for at least 20 seconds. If soap and water are not available, use hand sanitizer. Avoid contact with people who have cold symptoms. Try not to touch your mouth, nose, or eyes with your hands. Avoid breathing in smoke or chemical fumes. Breathing smoke or chemical fumes will make your condition worse. Get the flu shot every year. Contact a health care provider if: Your symptoms do not improve after 2  weeks. You have trouble coughing up the mucus. Your cough keeps you awake at night. You have a fever. Get help right away if you: Cough up blood. Feel pain in your chest. Have severe shortness of breath. Faint or keep feeling like you are going to faint. Have a severe headache. Have a fever or chills that get worse. These symptoms may represent a serious problem that is an emergency. Do not wait to see if the symptoms will go away. Get medical help right away. Call your local emergency services (911 in the [place] Do not drive yourself to the hospital. Summary Acute bronchitis is inflammation of the main airways (bronchi) that come off the windpipe (trachea) in the lungs. The swelling causes the airways to get smaller and make more mucus than normal. Drinking more fluids can help thin your mucus so it is easier to cough up. Take over-the-counter and prescription medicines only as told by your health care provider. Do not use any products that contain nicotine or tobacco. These products include cigarettes, chewing tobacco, and vaping devices, such as e-cigarettes. If you need help quitting, ask your health care provider. Contact a health care provider if your symptoms do not improve after 2 weeks. This information is not intended to replace advice given to you by your health care provider. Make sure you discuss any questions you have with your health care provider. Document Revised: 01/18/2022 Document Reviewed: 02/08/2021 Elsevier Patient Education  2024 [employer]   If you have been instructed to have an in-person evaluation today at a local Urgent Care facility, please use the link below. It will take you to a list of all of our available [facility], including address, phone number and hours of operation. Please do not delay care.  [facility]  If you or a family member do not have a primary care provider, use the link below to schedule a visit and establish  care. When you choose a [facility] primary care physician or advanced practice provider, you gain a long-term partner in health. Find a Primary Care Provider  Learn more about [facility]'s in-office and virtual care options: [facility] - Get Care Now

## 2024-09-02 ENCOUNTER — Inpatient Hospital Stay

## 2024-09-02 ENCOUNTER — Inpatient Hospital Stay: Payer: Self-pay | Attending: Oncology | Admitting: Hematology and Oncology

## 2024-09-02 ENCOUNTER — Inpatient Hospital Stay: Payer: Self-pay

## 2024-09-02 ENCOUNTER — Ambulatory Visit: Payer: Self-pay | Admitting: Hematology and Oncology

## 2024-09-02 VITALS — BP 139/82 | HR 90 | Temp 98.3°F | Resp 17 | Wt 283.8 lb

## 2024-09-02 DIAGNOSIS — D508 Other iron deficiency anemias: Secondary | ICD-10-CM

## 2024-09-02 DIAGNOSIS — Z5111 Encounter for antineoplastic chemotherapy: Secondary | ICD-10-CM | POA: Diagnosis not present

## 2024-09-02 DIAGNOSIS — E039 Hypothyroidism, unspecified: Secondary | ICD-10-CM | POA: Diagnosis not present

## 2024-09-02 DIAGNOSIS — C50412 Malignant neoplasm of upper-outer quadrant of left female breast: Secondary | ICD-10-CM | POA: Diagnosis present

## 2024-09-02 DIAGNOSIS — C50912 Malignant neoplasm of unspecified site of left female breast: Secondary | ICD-10-CM

## 2024-09-02 DIAGNOSIS — Z170 Estrogen receptor positive status [ER+]: Secondary | ICD-10-CM | POA: Diagnosis not present

## 2024-09-02 DIAGNOSIS — C792 Secondary malignant neoplasm of skin: Secondary | ICD-10-CM | POA: Diagnosis not present

## 2024-09-02 DIAGNOSIS — D509 Iron deficiency anemia, unspecified: Secondary | ICD-10-CM

## 2024-09-02 DIAGNOSIS — Z79890 Hormone replacement therapy (postmenopausal): Secondary | ICD-10-CM | POA: Insufficient documentation

## 2024-09-02 LAB — CMP (CANCER CENTER ONLY)
ALT: 7 U/L (ref 0–44)
AST: 10 U/L — ABNORMAL LOW (ref 15–41)
Albumin: 4.6 g/dL (ref 3.5–5.0)
Alkaline Phosphatase: 79 U/L (ref 38–126)
Anion gap: 8 (ref 5–15)
BUN: 16 mg/dL (ref 6–20)
CO2: 25 mmol/L (ref 22–32)
Calcium: 9.7 mg/dL (ref 8.9–10.3)
Chloride: 105 mmol/L (ref 98–111)
Creatinine: 1 mg/dL (ref 0.44–1.00)
GFR, Estimated: >60 mL/min (ref >60)
Glucose, Bld: 102 mg/dL — ABNORMAL HIGH (ref 70–99)
Potassium: 3.8 mmol/L (ref 3.5–5.1)
Sodium: 138 mmol/L (ref 135–145)
Total Bilirubin: 0.4 mg/dL (ref 0.0–1.2)
Total Protein: 8.3 g/dL — ABNORMAL HIGH (ref 6.5–8.1)

## 2024-09-02 LAB — CBC WITH DIFFERENTIAL/PLATELET
Abs Immature Granulocytes: 0.05 K/uL (ref 0.00–0.07)
Basophils Absolute: 0.1 K/uL (ref 0.0–0.1)
Basophils Relative: 1 %
Eosinophils Absolute: 0 K/uL (ref 0.0–0.5)
Eosinophils Relative: 0 %
HCT: 39.8 % (ref 36.0–46.0)
Hemoglobin: 12.8 g/dL (ref 12.0–15.0)
Immature Granulocytes: 1 %
Lymphocytes Relative: 20 %
Lymphs Abs: 1.9 K/uL (ref 0.7–4.0)
MCH: 24.8 pg — ABNORMAL LOW (ref 26.0–34.0)
MCHC: 32.2 g/dL (ref 30.0–36.0)
MCV: 77 fL — ABNORMAL LOW (ref 80.0–100.0)
Monocytes Absolute: 0.5 K/uL (ref 0.1–1.0)
Monocytes Relative: 6 %
Neutro Abs: 6.9 K/uL (ref 1.7–7.7)
Neutrophils Relative %: 72 %
Platelets: 309 K/uL (ref 150–400)
RBC: 5.17 MIL/uL — ABNORMAL HIGH (ref 3.87–5.11)
RDW: 17 % — ABNORMAL HIGH (ref 11.5–15.5)
WBC: 9.5 K/uL (ref 4.0–10.5)
nRBC: 0 % (ref 0.0–0.2)

## 2024-09-02 LAB — IRON AND IRON BINDING CAPACITY (CC-WL,HP ONLY)
Iron: 39 ug/dL (ref 28–170)
Saturation Ratios: 8 % — ABNORMAL LOW (ref 10.4–31.8)
TIBC: 489 ug/dL — ABNORMAL HIGH (ref 250–450)
UIBC: 450 ug/dL — ABNORMAL HIGH (ref 148–442)

## 2024-09-02 LAB — FERRITIN: Ferritin: 12 ng/mL (ref 11–307)

## 2024-09-02 MED ORDER — FULVESTRANT 250 MG/5ML IM SOSY
500.0000 mg | PREFILLED_SYRINGE | Freq: Once | INTRAMUSCULAR | Status: AC
  Administered 2024-09-02: 500 mg via INTRAMUSCULAR
  Filled 2024-09-02: qty 10

## 2024-09-02 NOTE — Progress Notes (Signed)
 [facility] Cancer Follow up:    Kimberly Hamlet, MD [address] [place] [address]   DIAGNOSIS:  Cancer Staging  No matching staging information was found for the patient.   SUMMARY OF ONCOLOGIC HISTORY: Oncology History Overview Note  The patient had bilateral screening mammography with tomography 03/19/2014. This was the patient's first ever mammography. Showed a possible mass in the left breast. Left diagnostic mammography and ultrasonography 04/01/2014 showed an irregular mass in the upper outer quadrant of the left breast with a possible satellite 1 cm anterior to it. On physical exam there was a firm palpable mass at the 1:00 position of the left breast 8 cm from the nipple. There was no palpable left axillary adenopathy. Ultrasound showed an irregular hypoechoic mass in the area in question measuring 1.4 cm. There was a 4 mm nodule located anterior to this. Ultrasound of the left axilla was benign.  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 The second mass was negative for malignancy. This was felt to be concordant.  On 04/08/2014 the patient underwent bilateral breast MRI. This showed a 9 mm enhancing mass in the subareolar area of the right breast in in the left breast, the previously noted mass measuring 1.3 cm. There were no morphologically abnormal lymph nodes  The right breast finding was followed up on 04/19/2014 with ultrasound which showed an intraductal soft tissue mass in the inferior subareolar worsening of the right breast measuring 8 mm. This was biopsied 04/19/2014 and showed ([id number]) ectatic duct, with no evidence of malignancy. Surgical excision  was recommended.  Accordingly on 05/03/2014 the patient underwent right lumpectomy, showing an intraductal papilloma, with known malignancy identified. Left lumpectomy on the same day showed an invasive ductal carcinoma measuring 1.5 cm, with one of the 2 sentinel lymph nodes positive for carcinoma. There was no extracapsular extension. Margins were clear and ample. HER-2 was repeated and was again negative.  The patient's case was discussed in the multidisciplinary breast cancer conference 05/12/2014. An Oncotype had been previously requested and this showed a recurrent score of 22, in the intermediate range. The patient qualifies for the S1007 study and this will be discussed with her. Otherwise the standard recommendation would be chemotherapy followed by radiation followed by anti-estrogens. Oncotype score of 22 predicts a risk of outside the breast recurrence within 10 years of 13% if the patient's only systemic therapy is tamoxifen for 5 years.  (2) adjuvant chemotherapy with cyclophosphamide  and docetaxel  started 07/06/2014, patient tolerated chemotherapy very poorly and it was discontinued after one cycle.    (3) adjuvant radiation completed 10/21/2014 1) Left Breast / 50.4 Gy in 28 fractions 2) Left supraclavicular / 50.4 Gy in 28 fractions 3) Left Posterior axillary boost  / 9.52 Gy in 28 fractions 4) Left Breast Boost / 10 Gy in 5 fractions  (4) anastrozole  started 01/19/2015-[name] informed me she never started this.   METASTATIC DISEASE to skin: September 2022 (5) mammography 05/24/2021 shows fluid collection in the right axilla, no obvious malignancy within each breast but superficial skin lesions throughout the upper outer left breast.  (A) CT scan of the abdomen and pelvis with contrast 06/10/2021 shows small low-attenuation liver  lesions which could be cysts but no definite evidence of metastatic disease.  (B) chest CT scan 07/14/2021 shows no evidence of metastatic  disease  (C) CT head with and without contrast on 07/15/2021 shows no evidence of metastatic disease (D) biopsy left breast skin lesion 07/25/2021 shows metastatic carcinoma, estrogen and progesterone receptor positive, HER2 not amplified (1+)  (E) excision right axillary mass 07/25/2021 shows benign epidermoid cyst (E) CA 27-29 on 07/05/2021 is normal at 21.1  (6) iron  deficiency anemia: On 07/05/2021 the ferritin was less than 4 and the iron  saturation 3%; hemoglobin was 9.1 with an MCV of 71.3  (A) Venofer  started on 07/18/2021, to be repeated x5   (B) GI evaluation 08/11/2021  (7) to start fulvestrant  09/13/2021  (A) to start abemaciclib  50 mg twice daily starting 09/13/2021  (8) status post left mastectomy 08/28/2021 for a pT3 pNX invasive lobular carcinoma, grade 2, with negative margins.   Recurrent breast cancer, left (HCC)  04/14/2014 Initial Diagnosis   Recurrent breast cancer, left (HCC)     CURRENT THERAPY: Faslodex   INTERVAL HISTORY: Devanee [name] 53 y.o. female with a history of recurrent breast cancer, presents for a routine follow-up.   Discussed the use of AI scribe software for clinical note transcription with the patient, who gave verbal consent to proceed.  History of Present Illness  Kimberly [name] is a 53 year old female with breast cancer who presents for routine follow-up and Faslodex  injection. She is accompanied by her son.  She has been receiving monthly Faslodex  injections without any reported issues and is due for her injection today. She has been on this treatment for an extended period.  She has not yet had an appointment for breast reconstruction surgery despite being referred to Dr. Earlis Ranks. She desires to have another breast and is seeking assistance in scheduling this consultation.  Three weeks ago, she underwent surgery for hemorrhoids, involving the removal of eleven internal and three external hemorrhoids. All cultures returned  non-malignant results, and she has not experienced any bleeding since the procedure. She believes her iron  levels are improving as she has more energy than before.  She is currently taking levothyroxine  0.75 mg for thyroid  management. She mentions irregular bowel movements.  Rest of the pertinent 10 point ROS reviewed and neg.  Patient Active Problem List   Diagnosis Date Noted   Hypothyroidism 08/23/2023   Pain in both hands 08/23/2023   Peripheral neuropathy 04/26/2023   Hypokalemia 06/28/2022   Bipolar depression (HCC)    Chronic pain of right knee 03/08/2022   S/P mastectomy, left 08/28/2021   Goals of care, counseling/discussion 07/18/2021   Microcytic anemia 07/05/2021   History of COVID-19 01/27/2021   Elevated LDL cholesterol level 12/30/2020   Oligouria 03/10/2020   History of breast cancer 02/11/2020   Total body pain 01/13/2019   Postmenopausal bleeding 10/25/2017   Chronic pain of both knees 05/03/2017   COPD (chronic obstructive pulmonary disease) (HCC) 03/20/2017   Tachycardia    Mixed incontinence 09/29/2015   Genetic testing 08/26/2015   Recurrent breast cancer, left (HCC) 04/14/2014   Disorder of bladder 09/29/2008   LOW BACK PAIN, CHRONIC 09/29/2008   Morbid obesity (HCC) 11/19/2007   IDA (iron  deficiency anemia) 11/14/2007   Mixed bipolar I disorder (HCC) 02/20/2007   Tobacco abuse 02/20/2007    has no known allergies.  MEDICAL HISTORY: Past Medical History:  Diagnosis Date   Acute cholecystitis 04/17/2019   Anemia    Anxiety  Arthritis    Bipolar 1 disorder (HCC)    Blood transfusion without reported diagnosis 2007   post bleeding childbirth   Cancer (HCC)    left breast cancer 2015   COPD (chronic obstructive pulmonary disease) (HCC)    pt reports she is not on oxygen    COVID-19 virus infection 06/07/2019   Depression    Diabetes mellitus without complication (HCC)    gestational   Hand injury, right, initial encounter 03/19/2019    Heart murmur    as a child-not adult-no cardiac work up   History of radiation therapy 08/31/14-10/21/14   left breast/ left supraclavicular 50.4 Gy 28 fx, lef tposterior axillary boost 9.52 Gy 28 fx, left rbeast boost/ 10 Gy 5 fx   Hypothyroidism    Personal history of chemotherapy    Personal history of radiation therapy    Rash and nonspecific skin eruption 12/27/2020    SURGICAL HISTORY: Past Surgical History:  Procedure Laterality Date   BREAST BIOPSY Left 04/01/2014   BREAST BIOPSY Right 04/19/2014   BREAST LUMPECTOMY Left 2015   BREAST LUMPECTOMY WITH AXILLARY LYMPH NODE DISSECTION Bilateral 05/03/2014   CHOLECYSTECTOMY N/A 04/18/2019   Procedure: LAPAROSCOPIC CHOLECYSTECTOMY WITH INTRAOPERATIVE CHOLANGIOGRAM;  Surgeon: Curvin Deward MOULD, MD;  Location: [facility];  Service: General;  Laterality: N/A;   DILATION AND CURETTAGE OF UTERUS     after childbirth   MASS EXCISION Right 07/25/2021   Procedure: RIGHT AXILLARY MASS EXCISION;  Surgeon: Ebbie Cough, MD;  Location: [facility] OR;  Service: General;  Laterality: Right;   MASTECTOMY Left 08/2021   MINOR BREAST BIOPSY Left 07/25/2021   Procedure: SKIN PUNCH BIOPSY LEFT BREAST;  Surgeon: Ebbie Cough, MD;  Location: [facility] OR;  Service: General;  Laterality: Left;   MULTIPLE TOOTH EXTRACTIONS     only 5 left   PORTACATH PLACEMENT Right 06/18/2014   Procedure: INSERTION PORT-A-CATH;  Surgeon: Cough Ebbie, MD;  Location: [facility];  Service: General;  Laterality: Right;   TONSILLECTOMY AND ADENOIDECTOMY     TOTAL MASTECTOMY Left 08/28/2021   Procedure: LEFT TOTAL MASTECTOMY;  Surgeon: Ebbie Cough, MD;  Location: [facility] OR;  Service: General;  Laterality: Left;  90 MINUTES- POST PER KELLY AND SHE WILL PUT IN ROOM    SOCIAL HISTORY: Social History   Socioeconomic History   Marital status: Widowed    Spouse name: Not on file   Number of children: 5   Years of education: 102   Highest education level: High  school graduate  Occupational History   Occupation: disabled  Tobacco Use   Smoking status: Every Day    Current packs/day: 0.50    Average packs/day: 0.5 packs/day for 34.0 years (17.0 ttl pk-yrs)    Types: Cigarettes   Smokeless tobacco: Never  Vaping Use   Vaping status: Never Used  Substance and Sexual Activity   Alcohol use: No   Drug use: No   Sexual activity: Not Currently  Other Topics Concern   Not on file  Social History Narrative   Patient lives in [place] with 3 of her children.    One adult son lives up the road and one lives in [place] .   Patient is on disability.    Patient enjoys spending time with her family and watching TV.    Patient walks around her home for exercise.       Are you right handed or left handed? Right handed   Are you currently employed ? Disability  What is your current occupation?   Do you live at home alone? Lives with son   Who lives with you?    What type of home do you live in: 1 story or 2 story? Lives in a two story home       Social Drivers of Health   Financial Resource Strain: Low Risk  (06/21/2023)   Overall Financial Resource Strain (CARDIA)    Difficulty of Paying Living Expenses: Not very hard  Food Insecurity: Food Insecurity Present (06/21/2023)   Hunger Vital Sign    Worried About Running Out of Food in the Last Year: Sometimes true    Ran Out of Food in the Last Year: Never true  Transportation Needs: No Transportation Needs (06/21/2023)   PRAPARE - Administrator, Civil Service (Medical): No    Lack of Transportation (Non-Medical): No  Physical Activity: Inactive (06/21/2023)   Exercise Vital Sign    Days of Exercise per Week: 0 days    Minutes of Exercise per Session: 0 min  Stress: No Stress Concern Present (06/21/2023)   [employer] of Occupational Health - Occupational Stress Questionnaire    Feeling of Stress : Not at all  Social Connections: Moderately Isolated (06/21/2023)   Social  Connection and Isolation Panel    Frequency of Communication with Friends and Family: More than three times a week    Frequency of Social Gatherings with Friends and Family: Three times a week    Attends Religious Services: More than 4 times per year    Active Member of Clubs or Organizations: No    Attends Banker Meetings: Never    Marital Status: Widowed  Intimate Partner Violence: Not At Risk (06/21/2023)   Humiliation, Afraid, Rape, and Kick questionnaire    Fear of Current or Ex-Partner: No    Emotionally Abused: No    Physically Abused: No    Sexually Abused: No    FAMILY HISTORY: Family History  Problem Relation Age of Onset   Heart disease Father    Cancer Father        Prostate   Cancer Maternal Grandmother    Depression Maternal Grandfather    Cancer Paternal Grandmother    Stomach cancer Neg Hx    Esophageal cancer Neg Hx    Rectal cancer Neg Hx     PHYSICAL EXAMINATION    Vitals:   09/02/24 1429  BP: 139/82  Pulse: 90  Resp: 17  Temp: 98.3 F (36.8 C)  SpO2: 97%     Physical Exam Constitutional:      General: She is not in acute distress.    Appearance: Normal appearance. She is not toxic-appearing.  HENT:     Head: Normocephalic and atraumatic.     Mouth/Throat:     Mouth: Mucous membranes are moist.     Pharynx: Oropharynx is clear. No oropharyngeal exudate or posterior oropharyngeal erythema.  Eyes:     General: No scleral icterus. Cardiovascular:     Rate and Rhythm: Normal rate and regular rhythm.     Pulses: Normal pulses.     Heart sounds: Normal heart sounds.  Pulmonary:     Effort: Pulmonary effort is normal.     Breath sounds: Normal breath sounds.  Chest:     Comments: Right breast benign, left breast s/p mastectomy and radiation, no sign of local recurrence Abdominal:     General: Abdomen is flat. Bowel sounds are normal. There is no distension.  Palpations: Abdomen is soft.     Tenderness: There is no  abdominal tenderness.  Musculoskeletal:        General: No swelling.     Cervical back: Neck supple.  Lymphadenopathy:     Cervical: No cervical adenopathy.     Upper Body:     Right upper body: No axillary adenopathy.     Left upper body: No axillary adenopathy.  Skin:    General: Skin is warm and dry.     Findings: No rash.  Neurological:     General: No focal deficit present.     Mental Status: She is alert.  Psychiatric:        Mood and Affect: Mood normal.        Behavior: Behavior normal.       ASSESSMENT and THERAPY PLAN:   Recurrent breast cancer, left [facility])  Assessment & Plan  Assessment and Plan Assessment & Plan Malignant neoplasm of left breast On Faslodex  adj since 2022 without issues. Awaiting breast reconstruction consultation. - Administered Faslodex  injection. - Provided contact information for Dr. Earlis Blossom.  Iron  deficiency anemia Post-hemorrhoid surgery with no bleeding. Increased energy suggests improving iron  levels. No recent labs since June. - Ordered iron  studies. - Scheduled lab appointment.  Hypothyroidism Stable on levothyroxine  0.75 mg. - Continue current levothyroxine  regimen.   Amber Stalls MD    All questions were answered. The patient knows to call the clinic with any problems, questions or concerns. We can certainly see the patient much sooner if necessary.  Total encounter time:30 minutes*in face-to-face visit time, chart review, lab review, care coordination, order entry, and documentation of the encounter time.  *Total Encounter Time as defined by the Centers for Medicare and Medicaid Services includes, in addition to the face-to-face time of a patient visit (documented in the note above) non-face-to-face time: obtaining and reviewing outside history, ordering and reviewing medications, tests or procedures, care coordination (communications with other health care professionals or caregivers) and documentation in the  medical record.

## 2024-09-02 NOTE — Assessment & Plan Note (Signed)
  Assessment & Plan  Assessment and Plan Assessment & Plan Malignant neoplasm of left breast On Faslodex  adj since 2022 without issues. Awaiting breast reconstruction consultation. - Administered Faslodex  injection. - Provided contact information for Dr. Earlis Blossom.  Iron  deficiency anemia Post-hemorrhoid surgery with no bleeding. Increased energy suggests improving iron  levels. No recent labs since June. - Ordered iron  studies. - Scheduled lab appointment.  Hypothyroidism Stable on levothyroxine  0.75 mg. - Continue current levothyroxine  regimen.   Amber Stalls MD

## 2024-09-07 ENCOUNTER — Other Ambulatory Visit: Payer: Self-pay

## 2024-09-07 DIAGNOSIS — E039 Hypothyroidism, unspecified: Secondary | ICD-10-CM

## 2024-09-07 MED ORDER — LEVOTHYROXINE SODIUM 75 MCG PO TABS: 75.0000 ug | ORAL_TABLET | ORAL | 1 refills | Status: AC

## 2024-09-10 ENCOUNTER — Encounter

## 2024-10-02 ENCOUNTER — Encounter: Payer: Self-pay | Admitting: Pharmacist

## 2024-10-02 ENCOUNTER — Inpatient Hospital Stay: Attending: Oncology

## 2024-10-02 VITALS — BP 135/85 | HR 98 | Temp 98.4°F | Resp 17

## 2024-10-02 DIAGNOSIS — C50412 Malignant neoplasm of upper-outer quadrant of left female breast: Secondary | ICD-10-CM | POA: Diagnosis present

## 2024-10-02 DIAGNOSIS — Z5111 Encounter for antineoplastic chemotherapy: Secondary | ICD-10-CM | POA: Insufficient documentation

## 2024-10-02 DIAGNOSIS — D509 Iron deficiency anemia, unspecified: Secondary | ICD-10-CM

## 2024-10-02 MED ORDER — FULVESTRANT 250 MG/5ML IM SOSY
500.0000 mg | PREFILLED_SYRINGE | Freq: Once | INTRAMUSCULAR | Status: AC
  Administered 2024-10-02: 500 mg via INTRAMUSCULAR
  Filled 2024-10-02: qty 10

## 2024-10-02 NOTE — Progress Notes (Signed)
 This patient is appearing on a report for being at risk of failing the adherence measure for cholesterol (statin) medications this calendar year.   Medication: rosuvastatin  Last fill date: 10/01/24 for 90 day supply = Pass   Reviewed medication indication, dosing, and goals of therapy.

## 2024-10-14 ENCOUNTER — Telehealth

## 2024-10-22 ENCOUNTER — Telehealth: Admitting: Physician Assistant

## 2024-10-22 DIAGNOSIS — J019 Acute sinusitis, unspecified: Secondary | ICD-10-CM | POA: Diagnosis not present

## 2024-10-22 DIAGNOSIS — B9689 Other specified bacterial agents as the cause of diseases classified elsewhere: Secondary | ICD-10-CM | POA: Diagnosis not present

## 2024-10-22 MED ORDER — AMOXICILLIN-POT CLAVULANATE 875-125 MG PO TABS: 1.0000 | ORAL_TABLET | Freq: Two times a day (BID) | ORAL | 0 refills | Status: DC

## 2024-10-22 MED ORDER — PREDNISONE 20 MG PO TABS: 40.0000 mg | ORAL_TABLET | Freq: Every day | ORAL | 0 refills | Status: DC

## 2024-10-22 MED ORDER — PROMETHAZINE-DM 6.25-15 MG/5ML PO SYRP: 5.0000 mL | ORAL_SOLUTION | Freq: Four times a day (QID) | ORAL | 0 refills | Status: DC | PRN

## 2024-10-22 NOTE — Patient Instructions (Signed)
 " Kimberly [name], thank you for joining [name], [name] for today's virtual visit.  While this provider is not your primary care provider (PCP), if your PCP is located in our provider database this encounter information will be shared with them immediately following your visit.   A [facility] MyChart account gives you access to today's visit and all your visits, tests, and labs performed at [facility]  click here if you don't have a [facility] MyChart account or go to [email or web address]  Consent: (Patient) Kimberly [name] provided verbal consent for this virtual visit at the beginning of the encounter.  Current Medications:  Current Outpatient Medications:    amoxicillin -clavulanate (AUGMENTIN ) 875-125 MG tablet, Take 1 tablet by mouth 2 (two) times daily., Disp: 20 tablet, Rfl: 0   predniSONE  (DELTASONE ) 20 MG tablet, Take 2 tablets (40 mg total) by mouth daily with breakfast., Disp: 10 tablet, Rfl: 0   promethazine -dextromethorphan  (PROMETHAZINE -DM) 6.25-15 MG/5ML syrup, Take 5 mLs by mouth 4 (four) times daily as needed., Disp: 118 mL, Rfl: 0   albuterol  (VENTOLIN  HFA) 108 (90 Base) MCG/ACT inhaler, Inhale 2 puffs into the lungs every 6 (six) hours as needed for wheezing or shortness of breath., Disp: 8 g, Rfl: 2   busPIRone  (BUSPAR ) 15 MG tablet, Take 15 mg by mouth 3 (three) times daily., Disp: , Rfl:    cyanocobalamin  (VITAMIN B12) 1000 MCG tablet, Take 1 tablet (1,000 mcg total) by mouth daily., Disp: 30 tablet, Rfl: 1   doxepin  (SINEQUAN ) 75 MG capsule, Take 75 mg by mouth at bedtime., Disp: , Rfl:    ferrous sulfate  324 MG TBEC, Take 1 tablet (324 mg total) by mouth daily., Disp: 90 tablet, Rfl: 3   hydrocortisone  (ANUSOL -HC) 25 MG suppository, Use every other night for 2-weeks until prescription is complete, Disp: 7 suppository, Rfl: 0   hydrocortisone  (ANUSOL -HC) 25 MG suppository, Place 1 suppository (25 mg total) rectally 2 (two) times daily.,  Disp: 6 suppository, Rfl: 0   levothyroxine  (SYNTHROID ) 75 MCG tablet, Take 1 tablet (75 mcg total) by mouth every morning. 30 minutes before food, Disp: 30 tablet, Rfl: 1   lidocaine -prilocaine  (EMLA ) cream, Apply 1 application topically as needed., Disp: 30 g, Rfl: 0   montelukast  (SINGULAIR ) 10 MG tablet, Take 1 tablet (10 mg total) by mouth at bedtime., Disp: 30 tablet, Rfl: 3   prazosin  (MINIPRESS ) 1 MG capsule, Take 1 capsule (1 mg total) by mouth at bedtime., Disp: 90 capsule, Rfl: 1   prochlorperazine  (COMPAZINE ) 5 MG tablet, TAKE 1 TABLET(5 MG) BY MOUTH EVERY 6 HOURS AS NEEDED FOR VOMITING OR NAUSEA (Patient not taking: Reported on 06/08/2024), Disp: 30 tablet, Rfl: 1   QUEtiapine  (SEROQUEL ) 400 MG tablet, Take 400 mg by mouth at bedtime. , Disp: , Rfl:    rosuvastatin  (CRESTOR ) 10 MG tablet, Take 1 tablet (10 mg total) by mouth daily., Disp: 90 tablet, Rfl: 1   Tiotropium Bromide  Monohydrate (SPIRIVA  RESPIMAT) 2.5 MCG/ACT AERS, Inhale 2 puffs into the lungs daily., Disp: 4 g, Rfl: 2   Medications ordered in this encounter:  Meds ordered this encounter  Medications   amoxicillin -clavulanate (AUGMENTIN ) 875-125 MG tablet    Sig: Take 1 tablet by mouth 2 (two) times daily.    Dispense:  20 tablet    Refill:  0    Supervising Provider:   LAMPTEY, PHILIP O [[id number]]   predniSONE  (DELTASONE ) 20 MG tablet    Sig: Take 2 tablets (40 mg total) by mouth daily with  breakfast.    Dispense:  10 tablet    Refill:  0    Supervising Provider:   LAMPTEY, PHILIP O [[id number]]   promethazine -dextromethorphan  (PROMETHAZINE -DM) 6.25-15 MG/5ML syrup    Sig: Take 5 mLs by mouth 4 (four) times daily as needed.    Dispense:  118 mL    Refill:  0    Supervising Provider:   BLAISE ALEENE KIDD [[id number]]     *If you need refills on other medications prior to your next appointment, please contact your pharmacy*  Follow-Up: Call back or seek an in-person evaluation if the symptoms worsen or if the condition  fails to improve as anticipated.  [facility] [phone]  Other Instructions Sinus Infection, Adult A sinus infection, also called sinusitis, is inflammation of your sinuses. Sinuses are hollow spaces in the bones around your face. Your sinuses are located: Around your eyes. In the middle of your forehead. Behind your nose. In your cheekbones. Mucus normally drains out of your sinuses. When your nasal tissues become inflamed or swollen, mucus can become trapped or blocked. This allows bacteria, viruses, and fungi to grow, which leads to infection. Most infections of the sinuses are caused by a virus. A sinus infection can develop quickly. It can last for up to 4 weeks (acute) or for more than 12 weeks (chronic). A sinus infection often develops after a cold. What are the causes? This condition is caused by anything that creates swelling in the sinuses or stops mucus from draining. This includes: Allergies. Asthma. Infection from bacteria or viruses. Deformities or blockages in your nose or sinuses. Abnormal growths in the nose (nasal polyps). Pollutants, such as chemicals or irritants in the air. Infection from fungi. This is rare. What increases the risk? You are more likely to develop this condition if you: Have a weak body defense system (immune system). Do a lot of swimming or diving. Overuse nasal sprays. Smoke. What are the signs or symptoms? The main symptoms of this condition are pain and a feeling of pressure around the affected sinuses. Other symptoms include: Stuffy nose or congestion that makes it difficult to breathe through your nose. Thick yellow or greenish drainage from your nose. Tenderness, swelling, and warmth over the affected sinuses. A cough that may get worse at night. Decreased sense of smell and taste. Extra mucus that collects in the throat or the back of the nose (postnasal drip) causing a sore throat or bad breath. Tiredness  (fatigue). Fever. How is this diagnosed? This condition is diagnosed based on: Your symptoms. Your medical history. A physical exam. Tests to find out if your condition is acute or chronic. This may include: Checking your nose for nasal polyps. Viewing your sinuses using a device that has a light (endoscope). Testing for allergies or bacteria. Imaging tests, such as an MRI or CT scan. In rare cases, a bone biopsy may be done to rule out more serious types of fungal sinus disease. How is this treated? Treatment for a sinus infection depends on the cause and whether your condition is chronic or acute. If caused by a virus, your symptoms should go away on their own within 10 days. You may be given medicines to relieve symptoms. They include: Medicines that shrink swollen nasal passages (decongestants). A spray that eases inflammation of the nostrils (topical intranasal corticosteroids). Rinses that help get rid of thick mucus in your nose (nasal saline washes). Medicines that treat allergies (antihistamines). Over-the-counter pain relievers. If caused by  bacteria, your health care provider may recommend waiting to see if your symptoms improve. Most bacterial infections will get better without antibiotic medicine. You may be given antibiotics if you have: A severe infection. A weak immune system. If caused by narrow nasal passages or nasal polyps, surgery may be needed. Follow these instructions at home: Medicines Take, use, or apply over-the-counter and prescription medicines only as told by your health care provider. These may include nasal sprays. If you were prescribed an antibiotic medicine, take it as told by your health care provider. Do not stop taking the antibiotic even if you start to feel better. Hydrate and humidify  Drink enough fluid to keep your urine pale yellow. Staying hydrated will help to thin your mucus. Use a cool mist humidifier to keep the humidity level in your  home above 50%. Inhale steam for 10-15 minutes, 3-4 times a day, or as told by your health care provider. You can do this in the bathroom while a hot shower is running. Limit your exposure to cool or dry air. Rest Rest as much as possible. Sleep with your head raised (elevated). Make sure you get enough sleep each night. General instructions  Apply a warm, moist washcloth to your face 3-4 times a day or as told by your health care provider. This will help with discomfort. Use nasal saline washes as often as told by your health care provider. Wash your hands often with soap and water to reduce your exposure to germs. If soap and water are not available, use hand sanitizer. Do not smoke. Avoid being around people who are smoking (secondhand smoke). Keep all follow-up visits. This is important. Contact a health care provider if: You have a fever. Your symptoms get worse. Your symptoms do not improve within 10 days. Get help right away if: You have a severe headache. You have persistent vomiting. You have severe pain or swelling around your face or eyes. You have vision problems. You develop confusion. Your neck is stiff. You have trouble breathing. These symptoms may be an emergency. Get help right away. Call 911. Do not wait to see if the symptoms will go away. Do not drive yourself to the hospital. Summary A sinus infection is soreness and inflammation of your sinuses. Sinuses are hollow spaces in the bones around your face. This condition is caused by nasal tissues that become inflamed or swollen. The swelling traps or blocks the flow of mucus. This allows bacteria, viruses, and fungi to grow, which leads to infection. If you were prescribed an antibiotic medicine, take it as told by your health care provider. Do not stop taking the antibiotic even if you start to feel better. Keep all follow-up visits. This is important. This information is not intended to replace advice given to  you by your health care provider. Make sure you discuss any questions you have with your health care provider. Document Revised: 09/12/2021 Document Reviewed: 09/12/2021 Elsevier Patient Education  2024 Elsevier Inc.   If you have been instructed to have an in-person evaluation today at a local Urgent Care facility, please use the link below. It will take you to a list of all of our available [facility], including address, phone number and hours of operation. Please do not delay care.  [facility]  If you or a family member do not have a primary care provider, use the link below to schedule a visit and establish care. When you choose a [facility]  primary care physician or advanced practice provider, you gain a long-term partner in health. Find a Primary Care Provider  Learn more about [facility] in-office and virtual care options:  - Get Care Now "

## 2024-10-22 NOTE — Progress Notes (Signed)
 " Virtual Visit Consent   Kimberly [name], you are scheduled for a virtual visit with a  provider today. Just as with appointments in the office, your consent must be obtained to participate. Your consent will be active for this visit and any virtual visit you may have with one of our providers in the next 365 days. If you have a MyChart account, a copy of this consent can be sent to you electronically.  As this is a virtual visit, video technology does not allow for your provider to perform a traditional examination. This may limit your provider's ability to fully assess your condition. If your provider identifies any concerns that need to be evaluated in person or the need to arrange testing (such as labs, EKG, etc.), we will make arrangements to do so. Although advances in technology are sophisticated, we cannot ensure that it will always work on either your end or our end. If the connection with a video visit is poor, the visit may have to be switched to a telephone visit. With either a video or telephone visit, we are not always able to ensure that we have a secure connection.  By engaging in this virtual visit, you consent to the provision of healthcare and authorize for your insurance to be billed (if applicable) for the services provided during this visit. Depending on your insurance coverage, you may receive a charge related to this service.  I need to obtain your verbal consent now. Are you willing to proceed with your visit today? Kimberly [name] has provided verbal consent on 10/22/2024 for a virtual visit (video or telephone). [name], [name]  Date: 10/22/2024 12:12 PM   Virtual Visit via Video Note   IDelon CHRISTELLA Dickinson, connected with  Kimberly [name]  ([id number], [birth year]) on 10/22/2024 at 12:00 PM EST by a video-enabled telemedicine application and verified that I am speaking with the correct person using two identifiers.  Location: Patient: Virtual Visit  Location Patient: Home Provider: Virtual Visit Location Provider: Home Office   I discussed the limitations of evaluation and management by telemedicine and the availability of in person appointments. The patient expressed understanding and agreed to proceed.    History of Present Illness: Kimberly [name] is a 54 y.o. who identifies as a female who was assigned female at birth, and is being seen today for cough.  HPI: URI  This is a new problem. The current episode started 1 to 4 weeks ago (1 week). The problem has been gradually worsening. Maximum temperature: subjective fevers. The fever has been present for 1 to 2 days. Associated symptoms include congestion, coughing (productive), diarrhea (chronically), headaches, rhinorrhea, sinus pain, a sore throat and wheezing. Pertinent negatives include no ear pain, nausea, plugged ear sensation or vomiting. She has tried acetaminophen , increased fluids and sleep (peppermint tea with lemon and honey) for the symptoms. The treatment provided no relief.     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   History of breast cancer 02/11/2020   Total body pain 01/13/2019   Postmenopausal bleeding 10/25/2017  Chronic pain of both knees 05/03/2017   COPD (chronic obstructive pulmonary disease) (HCC) 03/20/2017   Tachycardia    Mixed incontinence 09/29/2015   Genetic testing 08/26/2015   Recurrent breast cancer, left (HCC) 04/14/2014   Disorder of bladder 09/29/2008   LOW BACK PAIN, CHRONIC 09/29/2008   Morbid obesity (HCC)  11/19/2007   IDA (iron  deficiency anemia) 11/14/2007   Mixed bipolar I disorder (HCC) 02/20/2007   Tobacco abuse 02/20/2007    Allergies: Allergies[1] Medications: Current Medications[2]  Observations/Objective: Patient is well-developed, well-nourished in no acute distress.  Resting comfortably at home.  Head is normocephalic, atraumatic.  No labored breathing.  Speech is clear and coherent with logical content.  Patient is alert and oriented at baseline.  Very congested voice, hear nasal congestion  Assessment and Plan: 1. Acute bacterial sinusitis (Primary) - amoxicillin -clavulanate (AUGMENTIN ) 875-125 MG tablet; Take 1 tablet by mouth 2 (two) times daily.  Dispense: 20 tablet; Refill: 0 - predniSONE  (DELTASONE ) 20 MG tablet; Take 2 tablets (40 mg total) by mouth daily with breakfast.  Dispense: 10 tablet; Refill: 0 - promethazine -dextromethorphan  (PROMETHAZINE -DM) 6.25-15 MG/5ML syrup; Take 5 mLs by mouth 4 (four) times daily as needed.  Dispense: 118 mL; Refill: 0  - Worsening symptoms that have not responded to OTC medications.  - Will give Augmentin  and Prednisone  - Add Promethazine  DM for cough - Continue allergy medications.  - Steam and humidifier can help - Stay well hydrated and get plenty of rest.  - Seek in person evaluation if no symptom improvement or if symptoms worsen   Follow Up Instructions: I discussed the assessment and treatment plan with the patient. The patient was provided an opportunity to ask questions and all were answered. The patient agreed with the plan and demonstrated an understanding of the instructions.  A copy of instructions were sent to the patient via MyChart unless otherwise noted below.    The patient was advised to call back or seek an in-person evaluation if the symptoms worsen or if the condition fails to improve as anticipated.    Delon HERO Dulcy Sida, [name]     [1] No Known Allergies [2]  Current Outpatient Medications:     amoxicillin -clavulanate (AUGMENTIN ) 875-125 MG tablet, Take 1 tablet by mouth 2 (two) times daily., Disp: 20 tablet, Rfl: 0   predniSONE  (DELTASONE ) 20 MG tablet, Take 2 tablets (40 mg total) by mouth daily with breakfast., Disp: 10 tablet, Rfl: 0   promethazine -dextromethorphan  (PROMETHAZINE -DM) 6.25-15 MG/5ML syrup, Take 5 mLs by mouth 4 (four) times daily as needed., Disp: 118 mL, Rfl: 0   albuterol  (VENTOLIN  HFA) 108 (90 Base) MCG/ACT inhaler, Inhale 2 puffs into the lungs every 6 (six) hours as needed for wheezing or shortness of breath., Disp: 8 g, Rfl: 2   busPIRone  (BUSPAR ) 15 MG tablet, Take 15 mg by mouth 3 (three) times daily., Disp: , Rfl:    cyanocobalamin  (VITAMIN B12) 1000 MCG tablet, Take 1 tablet (1,000 mcg total) by mouth daily., Disp: 30 tablet, Rfl: 1   doxepin  (SINEQUAN ) 75 MG capsule, Take 75 mg by mouth at bedtime., Disp: , Rfl:    ferrous sulfate  324 MG TBEC, Take 1 tablet (324 mg total) by mouth daily., Disp: 90 tablet, Rfl: 3   hydrocortisone  (ANUSOL -HC) 25 MG suppository, Use every other night for 2-weeks until prescription is complete, Disp: 7 suppository, Rfl: 0   hydrocortisone  (ANUSOL -HC) 25 MG suppository, Place 1 suppository (25 mg total) rectally 2 (two) times daily., Disp: 6 suppository, Rfl: 0  levothyroxine  (SYNTHROID ) 75 MCG tablet, Take 1 tablet (75 mcg total) by mouth every morning. 30 minutes before food, Disp: 30 tablet, Rfl: 1   lidocaine -prilocaine  (EMLA ) cream, Apply 1 application topically as needed., Disp: 30 g, Rfl: 0   montelukast  (SINGULAIR ) 10 MG tablet, Take 1 tablet (10 mg total) by mouth at bedtime., Disp: 30 tablet, Rfl: 3   prazosin  (MINIPRESS ) 1 MG capsule, Take 1 capsule (1 mg total) by mouth at bedtime., Disp: 90 capsule, Rfl: 1   prochlorperazine  (COMPAZINE ) 5 MG tablet, TAKE 1 TABLET(5 MG) BY MOUTH EVERY 6 HOURS AS NEEDED FOR VOMITING OR NAUSEA (Patient not taking: Reported on 06/08/2024), Disp: 30 tablet, Rfl: 1   QUEtiapine  (SEROQUEL ) 400  MG tablet, Take 400 mg by mouth at bedtime. , Disp: , Rfl:    rosuvastatin  (CRESTOR ) 10 MG tablet, Take 1 tablet (10 mg total) by mouth daily., Disp: 90 tablet, Rfl: 1   Tiotropium Bromide  Monohydrate (SPIRIVA  RESPIMAT) 2.5 MCG/ACT AERS, Inhale 2 puffs into the lungs daily., Disp: 4 g, Rfl: 2  "

## 2024-10-23 ENCOUNTER — Telehealth: Payer: Self-pay | Admitting: Family Medicine

## 2024-10-23 ENCOUNTER — Other Ambulatory Visit (HOSPITAL_COMMUNITY): Payer: Self-pay

## 2024-10-23 DIAGNOSIS — B9689 Other specified bacterial agents as the cause of diseases classified elsewhere: Secondary | ICD-10-CM

## 2024-10-23 MED ORDER — PROMETHAZINE-DM 6.25-15 MG/5ML PO SYRP: 5.0000 mL | ORAL_SOLUTION | Freq: Four times a day (QID) | ORAL | 0 refills | Status: DC | PRN

## 2024-10-23 MED ORDER — PROMETHAZINE-DM 6.25-15 MG/5ML PO SYRP
5.0000 mL | ORAL_SOLUTION | Freq: Four times a day (QID) | ORAL | 0 refills | Status: DC | PRN
  Filled 2024-10-23: qty 118, 6d supply, fill #0

## 2024-10-23 MED ORDER — PREDNISONE 20 MG PO TABS
40.0000 mg | ORAL_TABLET | Freq: Every day | ORAL | 0 refills | Status: DC
  Filled 2024-10-23: qty 10, 5d supply, fill #0

## 2024-10-23 MED ORDER — AMOXICILLIN-POT CLAVULANATE 875-125 MG PO TABS: 1.0000 | ORAL_TABLET | Freq: Two times a day (BID) | ORAL | 0 refills | Status: AC

## 2024-10-23 MED ORDER — AMOXICILLIN-POT CLAVULANATE 875-125 MG PO TABS
1.0000 | ORAL_TABLET | Freq: Two times a day (BID) | ORAL | 0 refills | Status: DC
  Filled 2024-10-23: qty 20, 10d supply, fill #0

## 2024-10-23 MED ORDER — PREDNISONE 20 MG PO TABS: 40.0000 mg | ORAL_TABLET | Freq: Every day | ORAL | 0 refills | Status: AC

## 2024-10-23 NOTE — Telephone Encounter (Signed)
 Called patient and after confirming her name and date of birth, inquired what the nature of her emergency was.  Patient reports that she had a telehealth visit with a PA yesterday for sinusitis.  She was prescribed Augmentin , prednisone  and promethazine  dextromethorphan  for her symptoms.  She was told they were sent to the [facility] in [place] and when she went to pick them up today she was told that they have no prescriptions for her.  After reviewing the chart it does appear that these medications were sent on our end.  Patient is requesting that I reach out to the pharmacy to see what is going on.  I informed the patient that I would try to do so as I had time today.  [facility], they had not received the original prescriptions. Resent scripts and confirmed they were received.   Returned call to patient to inform her.

## 2024-10-30 ENCOUNTER — Inpatient Hospital Stay: Attending: Oncology

## 2024-10-30 VITALS — BP 143/86 | HR 103 | Temp 98.7°F | Resp 20

## 2024-10-30 DIAGNOSIS — C50412 Malignant neoplasm of upper-outer quadrant of left female breast: Secondary | ICD-10-CM | POA: Insufficient documentation

## 2024-10-30 DIAGNOSIS — Z5111 Encounter for antineoplastic chemotherapy: Secondary | ICD-10-CM | POA: Diagnosis present

## 2024-10-30 DIAGNOSIS — D509 Iron deficiency anemia, unspecified: Secondary | ICD-10-CM

## 2024-10-30 MED ORDER — FULVESTRANT 250 MG/5ML IM SOSY
500.0000 mg | PREFILLED_SYRINGE | Freq: Once | INTRAMUSCULAR | Status: AC
  Administered 2024-10-30: 500 mg via INTRAMUSCULAR
  Filled 2024-10-30: qty 10

## 2024-11-02 ENCOUNTER — Inpatient Hospital Stay

## 2024-11-10 ENCOUNTER — Other Ambulatory Visit: Payer: Self-pay

## 2024-11-10 DIAGNOSIS — E538 Deficiency of other specified B group vitamins: Secondary | ICD-10-CM

## 2024-11-11 ENCOUNTER — Telehealth: Admitting: Emergency Medicine

## 2024-11-11 DIAGNOSIS — J111 Influenza due to unidentified influenza virus with other respiratory manifestations: Secondary | ICD-10-CM

## 2024-11-11 DIAGNOSIS — B349 Viral infection, unspecified: Secondary | ICD-10-CM | POA: Diagnosis not present

## 2024-11-11 MED ORDER — OSELTAMIVIR PHOSPHATE 75 MG PO CAPS: 75.0000 mg | ORAL_CAPSULE | Freq: Two times a day (BID) | ORAL | 0 refills | Status: AC

## 2024-11-11 MED ORDER — PROMETHAZINE-DM 6.25-15 MG/5ML PO SYRP: 5.0000 mL | ORAL_SOLUTION | Freq: Four times a day (QID) | ORAL | 0 refills | Status: AC | PRN

## 2024-11-11 NOTE — Patient Instructions (Signed)
 " Kimberly [name], thank you for joining Jon CHRISTELLA Belt, NP for today's virtual visit.  While this provider is not your primary care provider (PCP), if your PCP is located in our provider database this encounter information will be shared with them immediately following your visit.   A [facility] MyChart account gives you access to today's visit and all your visits, tests, and labs performed at [facility]  click here if you don't have a [facility] MyChart account or go to [email or web address]  Consent: (Patient) Kimberly [name] provided verbal consent for this virtual visit at the beginning of the encounter.  Current Medications:  Current Outpatient Medications:    oseltamivir  (TAMIFLU ) 75 MG capsule, Take 1 capsule (75 mg total) by mouth 2 (two) times daily., Disp: 10 capsule, Rfl: 0   albuterol  (VENTOLIN  HFA) 108 (90 Base) MCG/ACT inhaler, Inhale 2 puffs into the lungs every 6 (six) hours as needed for wheezing or shortness of breath., Disp: 8 g, Rfl: 2   amoxicillin -clavulanate (AUGMENTIN ) 875-125 MG tablet, Take 1 tablet by mouth 2 (two) times daily., Disp: 20 tablet, Rfl: 0   busPIRone  (BUSPAR ) 15 MG tablet, Take 15 mg by mouth 3 (three) times daily., Disp: , Rfl:    cyanocobalamin  (VITAMIN B12) 1000 MCG tablet, Take 1 tablet (1,000 mcg total) by mouth daily., Disp: 30 tablet, Rfl: 1   doxepin  (SINEQUAN ) 75 MG capsule, Take 75 mg by mouth at bedtime., Disp: , Rfl:    ferrous sulfate  324 MG TBEC, Take 1 tablet (324 mg total) by mouth daily., Disp: 90 tablet, Rfl: 3   hydrocortisone  (ANUSOL -HC) 25 MG suppository, Use every other night for 2-weeks until prescription is complete, Disp: 7 suppository, Rfl: 0   hydrocortisone  (ANUSOL -HC) 25 MG suppository, Place 1 suppository (25 mg total) rectally 2 (two) times daily., Disp: 6 suppository, Rfl: 0   levothyroxine  (SYNTHROID ) 75 MCG tablet, Take 1 tablet (75 mcg total) by mouth every morning. 30 minutes before food, Disp: 30  tablet, Rfl: 1   lidocaine -prilocaine  (EMLA ) cream, Apply 1 application topically as needed., Disp: 30 g, Rfl: 0   montelukast  (SINGULAIR ) 10 MG tablet, Take 1 tablet (10 mg total) by mouth at bedtime., Disp: 30 tablet, Rfl: 3   prazosin  (MINIPRESS ) 1 MG capsule, Take 1 capsule (1 mg total) by mouth at bedtime., Disp: 90 capsule, Rfl: 1   predniSONE  (DELTASONE ) 20 MG tablet, Take 2 tablets (40 mg total) by mouth daily with breakfast., Disp: 10 tablet, Rfl: 0   prochlorperazine  (COMPAZINE ) 5 MG tablet, TAKE 1 TABLET(5 MG) BY MOUTH EVERY 6 HOURS AS NEEDED FOR VOMITING OR NAUSEA (Patient not taking: Reported on 06/08/2024), Disp: 30 tablet, Rfl: 1   promethazine -dextromethorphan  (PROMETHAZINE -DM) 6.25-15 MG/5ML syrup, Take 5 mLs by mouth 4 (four) times daily as needed., Disp: 118 mL, Rfl: 0   QUEtiapine  (SEROQUEL ) 400 MG tablet, Take 400 mg by mouth at bedtime. , Disp: , Rfl:    rosuvastatin  (CRESTOR ) 10 MG tablet, Take 1 tablet (10 mg total) by mouth daily., Disp: 90 tablet, Rfl: 1   Tiotropium Bromide  Monohydrate (SPIRIVA  RESPIMAT) 2.5 MCG/ACT AERS, Inhale 2 puffs into the lungs daily., Disp: 4 g, Rfl: 2   Medications ordered in this encounter:  Meds ordered this encounter  Medications   oseltamivir  (TAMIFLU ) 75 MG capsule    Sig: Take 1 capsule (75 mg total) by mouth 2 (two) times daily.    Dispense:  10 capsule    Refill:  0   promethazine -dextromethorphan  (PROMETHAZINE -DM) 6.25-15  MG/5ML syrup    Sig: Take 5 mLs by mouth 4 (four) times daily as needed.    Dispense:  118 mL    Refill:  0     *If you need refills on other medications prior to your next appointment, please contact your pharmacy*  Follow-Up: Call back or seek an in-person evaluation if the symptoms worsen or if the condition fails to improve as anticipated.  [facility] [phone]  Other Instructions  I am concerned about your wheezing. Use your albuterol  inhaler immediately and if this does not relieve  your wheezing, go immediately to an urgent care for breathing treatments or to the ER if urgent care is closed.    If you have been instructed to have an in-person evaluation today at a local Urgent Care facility, please use the link below. It will take you to a list of all of our available [facility], including address, phone number and hours of operation. Please do not delay care.  [facility]  If you or a family member do not have a primary care provider, use the link below to schedule a visit and establish care. When you choose a [facility] primary care physician or advanced practice provider, you gain a long-term partner in health. Find a Primary Care Provider  Learn more about [facility] in-office and virtual care options: [facility] - Get Care Now  "

## 2024-11-11 NOTE — Progress Notes (Signed)
 " Virtual Visit Consent   Kimberly [name], you are scheduled for a virtual visit with a [facility] provider today. Just as with appointments in the office, your consent must be obtained to participate. Your consent will be active for this visit and any virtual visit you may have with one of our providers in the next 365 days. If you have a MyChart account, a copy of this consent can be sent to you electronically.  As this is a virtual visit, video technology does not allow for your provider to perform a traditional examination. This may limit your provider's ability to fully assess your condition. If your provider identifies any concerns that need to be evaluated in person or the need to arrange testing (such as labs, EKG, etc.), we will make arrangements to do so. Although advances in technology are sophisticated, we cannot ensure that it will always work on either your end or our end. If the connection with a video visit is poor, the visit may have to be switched to a telephone visit. With either a video or telephone visit, we are not always able to ensure that we have a secure connection.  By engaging in this virtual visit, you consent to the provision of healthcare and authorize for your insurance to be billed (if applicable) for the services provided during this visit. Depending on your insurance coverage, you may receive a charge related to this service.  I need to obtain your verbal consent now. Are you willing to proceed with your visit today? Kimberly [name] has provided verbal consent on 11/11/2024 for a virtual visit (video or telephone). Jon CHRISTELLA Belt, NP  Date: 11/11/2024 7:21 PM   Virtual Visit via Video Note   I, Jon CHRISTELLA Belt, connected with  Kimberly [name]  ([id number], 19-Jan-1971) on 11/11/24 at  7:15 PM EST by a video-enabled telemedicine application and verified that I am speaking with the correct person using two identifiers.  Location: Patient: Virtual Visit Location  Patient: Home Provider: Virtual Visit Location Provider: Home Office   I discussed the limitations of evaluation and management by telemedicine and the availability of in person appointments. The patient expressed understanding and agreed to proceed.    History of Present Illness: Kimberly [name] is a 54 y.o. who identifies as a female who was assigned female at birth, and is being seen today for flu.   3 grandchildren with influenza 2 days ago. Pt is now sick, too.   Can't find nebulizer, is using albuterol  inhaler, it is helping some. Coughing so much everything hurts. Congestion, chills, body aches, fever.   Taking nighttime cold and flu.   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   History of breast cancer 02/11/2020   Total body pain 01/13/2019   Postmenopausal bleeding 10/25/2017   Chronic pain of both knees 05/03/2017   COPD (chronic obstructive pulmonary disease) (HCC) 03/20/2017   Tachycardia    Mixed incontinence 09/29/2015   Genetic testing 08/26/2015   Recurrent breast cancer, left (HCC) 04/14/2014   Disorder of bladder 09/29/2008  LOW BACK PAIN, CHRONIC 09/29/2008   Morbid obesity (HCC) 11/19/2007   IDA (iron  deficiency anemia) 11/14/2007   Mixed bipolar I disorder (HCC) 02/20/2007   Tobacco abuse 02/20/2007    Allergies: Allergies[1] Medications: Current Medications[2]  Observations/Objective: Patient is well-developed, well-nourished in no acute distress.  Resting comfortably  at home.  Head is normocephalic, atraumatic.   She is audibly wheezing. Frequent coughing.  Speech is clear and coherent with logical content.  Patient is alert and oriented at baseline.    Assessment and Plan: 1. Viral infection (Primary) - oseltamivir  (TAMIFLU ) 75 MG capsule; Take 1 capsule (75 mg total) by mouth 2 (two) times daily.  Dispense: 10 capsule; Refill: 0 - promethazine -dextromethorphan  (PROMETHAZINE -DM) 6.25-15 MG/5ML syrup; Take 5 mLs by mouth 4 (four) times daily as needed.  Dispense: 118 mL; Refill: 0  2. Influenza-like illness - oseltamivir  (TAMIFLU ) 75 MG capsule; Take 1 capsule (75 mg total) by mouth 2 (two) times daily.  Dispense: 10 capsule; Refill: 0 - promethazine -dextromethorphan  (PROMETHAZINE -DM) 6.25-15 MG/5ML syrup; Take 5 mLs by mouth 4 (four) times daily as needed.  Dispense: 118 mL; Refill: 0  Most likely flu. I am concerned about wheezing. Pt instructed to use her albuterol  inhaler immediately and if this does not relieve her wheezing, she must go to urgent care or ER if UC is closed.   Follow Up Instructions: I discussed the assessment and treatment plan with the patient. The patient was provided an opportunity to ask questions and all were answered. The patient agreed with the plan and demonstrated an understanding of the instructions.  A copy of instructions were sent to the patient via MyChart unless otherwise noted below.    The patient was advised to call back or seek an in-person evaluation if the symptoms worsen or if the condition fails to improve as anticipated.    Jon CHRISTELLA Belt, NP     [1] No Known Allergies [2]  Current Outpatient Medications:    oseltamivir  (TAMIFLU ) 75 MG capsule, Take 1 capsule (75 mg total) by mouth 2 (two) times daily., Disp: 10 capsule, Rfl: 0   albuterol  (VENTOLIN  HFA) 108 (90 Base) MCG/ACT inhaler, Inhale 2 puffs into the lungs every 6 (six) hours as needed for wheezing or shortness of breath., Disp: 8 g, Rfl: 2   amoxicillin -clavulanate (AUGMENTIN ) 875-125 MG  tablet, Take 1 tablet by mouth 2 (two) times daily., Disp: 20 tablet, Rfl: 0   busPIRone  (BUSPAR ) 15 MG tablet, Take 15 mg by mouth 3 (three) times daily., Disp: , Rfl:    cyanocobalamin  (VITAMIN B12) 1000 MCG tablet, Take 1 tablet (1,000 mcg total) by mouth daily., Disp: 30 tablet, Rfl: 1   doxepin  (SINEQUAN ) 75 MG capsule, Take 75 mg by mouth at bedtime., Disp: , Rfl:    ferrous sulfate  324 MG TBEC, Take 1 tablet (324 mg total) by mouth daily., Disp: 90 tablet, Rfl: 3   hydrocortisone  (ANUSOL -HC) 25 MG suppository, Use every other night for 2-weeks until prescription is complete, Disp: 7 suppository, Rfl: 0   hydrocortisone  (ANUSOL -HC) 25 MG suppository, Place 1 suppository (25 mg total) rectally 2 (two) times daily., Disp: 6 suppository, Rfl: 0   levothyroxine  (SYNTHROID ) 75 MCG tablet, Take 1 tablet (75 mcg total) by mouth every morning. 30 minutes before food, Disp: 30 tablet, Rfl: 1   lidocaine -prilocaine  (EMLA ) cream, Apply 1 application topically as needed., Disp: 30 g, Rfl: 0   montelukast  (SINGULAIR ) 10 MG tablet, Take 1 tablet (10 mg total) by mouth at bedtime.,  Disp: 30 tablet, Rfl: 3   prazosin  (MINIPRESS ) 1 MG capsule, Take 1 capsule (1 mg total) by mouth at bedtime., Disp: 90 capsule, Rfl: 1   predniSONE  (DELTASONE ) 20 MG tablet, Take 2 tablets (40 mg total) by mouth daily with breakfast., Disp: 10 tablet, Rfl: 0   prochlorperazine  (COMPAZINE ) 5 MG tablet, TAKE 1 TABLET(5 MG) BY MOUTH EVERY 6 HOURS AS NEEDED FOR VOMITING OR NAUSEA (Patient not taking: Reported on 06/08/2024), Disp: 30 tablet, Rfl: 1   promethazine -dextromethorphan  (PROMETHAZINE -DM) 6.25-15 MG/5ML syrup, Take 5 mLs by mouth 4 (four) times daily as needed., Disp: 118 mL, Rfl: 0   QUEtiapine  (SEROQUEL ) 400 MG tablet, Take 400 mg by mouth at bedtime. , Disp: , Rfl:    rosuvastatin  (CRESTOR ) 10 MG tablet, Take 1 tablet (10 mg total) by mouth daily., Disp: 90 tablet, Rfl: 1   Tiotropium Bromide  Monohydrate (SPIRIVA  RESPIMAT)  2.5 MCG/ACT AERS, Inhale 2 puffs into the lungs daily., Disp: 4 g, Rfl: 2  "

## 2024-12-03 ENCOUNTER — Inpatient Hospital Stay: Attending: Oncology | Admitting: Hematology and Oncology

## 2024-12-03 ENCOUNTER — Inpatient Hospital Stay

## 2024-12-03 ENCOUNTER — Encounter
